# Patient Record
Sex: Male | Born: 1970
Health system: Southern US, Community
[De-identification: ages and names within clinical notes are randomized; demographics above are authoritative.]

## PROBLEM LIST (undated history)

## (undated) DIAGNOSIS — R7881 Bacteremia: Secondary | ICD-10-CM

## (undated) DIAGNOSIS — I96 Gangrene, not elsewhere classified: Secondary | ICD-10-CM

## (undated) DIAGNOSIS — E119 Type 2 diabetes mellitus without complications: Secondary | ICD-10-CM

## (undated) DIAGNOSIS — B955 Unspecified streptococcus as the cause of diseases classified elsewhere: Secondary | ICD-10-CM

## (undated) DIAGNOSIS — I739 Peripheral vascular disease, unspecified: Secondary | ICD-10-CM

## (undated) HISTORY — DX: Peripheral vascular disease, unspecified: I73.9

## (undated) HISTORY — DX: Bacteremia: R78.81

## (undated) HISTORY — DX: Unspecified streptococcus as the cause of diseases classified elsewhere: B95.5

---

## 2008-01-10 ENCOUNTER — Emergency Department (HOSPITAL_COMMUNITY): Admission: EM | Admit: 2008-01-10 | Discharge: 2008-01-10 | Payer: Self-pay | Admitting: Emergency Medicine

## 2008-01-10 ENCOUNTER — Encounter (INDEPENDENT_AMBULATORY_CARE_PROVIDER_SITE_OTHER): Payer: Self-pay | Admitting: Cardiovascular Disease

## 2008-01-11 ENCOUNTER — Ambulatory Visit (HOSPITAL_COMMUNITY): Admission: RE | Admit: 2008-01-11 | Discharge: 2008-01-11 | Payer: Self-pay | Admitting: Cardiovascular Disease

## 2008-10-31 ENCOUNTER — Emergency Department (HOSPITAL_COMMUNITY): Admission: EM | Admit: 2008-10-31 | Discharge: 2008-10-31 | Payer: Self-pay | Admitting: Emergency Medicine

## 2011-02-02 LAB — DIFFERENTIAL
Basophils Absolute: 0 10*3/uL (ref 0.0–0.1)
Eosinophils Relative: 2 % (ref 0–5)
Lymphocytes Relative: 24 % (ref 12–46)
Lymphs Abs: 1.5 10*3/uL (ref 0.7–4.0)
Neutro Abs: 4.4 10*3/uL (ref 1.7–7.7)
Neutrophils Relative %: 69 % (ref 43–77)

## 2011-02-02 LAB — BASIC METABOLIC PANEL
BUN: 10 mg/dL (ref 6–23)
Calcium: 9.8 mg/dL (ref 8.4–10.5)
GFR calc non Af Amer: >60 mL/min (ref >60)
Glucose, Bld: 147 mg/dL — ABNORMAL HIGH (ref 70–99)
Potassium: 4.5 mEq/L (ref 3.5–5.1)

## 2011-02-02 LAB — POCT CARDIAC MARKERS
CKMB, poc: 1.5 ng/mL (ref 1.0–8.0)
Myoglobin, poc: 60.1 ng/mL (ref 12–200)
Troponin i, poc: <0.05 ng/mL (ref 0.00–0.09)
Troponin i, poc: <0.05 ng/mL (ref 0.00–0.09)

## 2011-02-02 LAB — CBC
HCT: 43.6 % (ref 39.0–52.0)
Platelets: 205 10*3/uL (ref 150–400)
RDW: 13.2 % (ref 11.5–15.5)
WBC: 6.4 10*3/uL (ref 4.0–10.5)

## 2011-07-13 LAB — CBC
HCT: 43.2
Hemoglobin: 15.1
MCHC: 34.9
MCV: 83.2
Platelets: 195
RDW: 13.5

## 2011-07-13 LAB — POCT I-STAT, CHEM 8
Chloride: 102
Glucose, Bld: 163 — ABNORMAL HIGH
HCT: 45
Sodium: 136

## 2011-07-13 LAB — DIFFERENTIAL
Basophils Absolute: 0
Eosinophils Absolute: 0.1
Eosinophils Relative: 2
Lymphocytes Relative: 27
Monocytes Absolute: 0.3

## 2011-07-13 LAB — POCT CARDIAC MARKERS
CKMB, poc: 1.7
CKMB, poc: 1.9
Myoglobin, poc: 52.7
Troponin i, poc: <0.05

## 2011-07-13 LAB — CK TOTAL AND CKMB (NOT AT ARMC): Relative Index: 2.8 — ABNORMAL HIGH

## 2011-07-13 LAB — TROPONIN I: Troponin I: 0.01

## 2011-07-13 LAB — HEMOGLOBIN A1C
Hgb A1c MFr Bld: 6.8 — ABNORMAL HIGH
Mean Plasma Glucose: 165

## 2011-07-13 LAB — MAGNESIUM: Magnesium: 1.8

## 2011-07-13 LAB — PROTIME-INR: INR: 0.9

## 2015-08-21 ENCOUNTER — Ambulatory Visit: Payer: Self-pay | Admitting: Family Medicine

## 2015-08-23 ENCOUNTER — Ambulatory Visit: Payer: Self-pay | Admitting: Family Medicine

## 2020-05-28 ENCOUNTER — Ambulatory Visit: Payer: Self-pay

## 2020-05-30 ENCOUNTER — Ambulatory Visit
Admission: RE | Admit: 2020-05-30 | Discharge: 2020-05-30 | Disposition: A | Discharge status: Home / Self Care | Payer: Self-pay | Source: Ambulatory Visit

## 2020-05-30 ENCOUNTER — Emergency Department (HOSPITAL_COMMUNITY): Payer: Self-pay

## 2020-05-30 ENCOUNTER — Inpatient Hospital Stay (HOSPITAL_COMMUNITY): Payer: Self-pay

## 2020-05-30 ENCOUNTER — Inpatient Hospital Stay (HOSPITAL_COMMUNITY)
Admission: EM | Admit: 2020-05-30 | Discharge: 2020-06-07 | DRG: 853 | Disposition: A | Discharge status: Home Health | Payer: Self-pay | Attending: Family Medicine | Admitting: Family Medicine

## 2020-05-30 ENCOUNTER — Encounter (HOSPITAL_COMMUNITY): Payer: Self-pay | Admitting: *Deleted

## 2020-05-30 ENCOUNTER — Other Ambulatory Visit: Payer: Self-pay

## 2020-05-30 DIAGNOSIS — Z20822 Contact with and (suspected) exposure to covid-19: Secondary | ICD-10-CM | POA: Diagnosis present

## 2020-05-30 DIAGNOSIS — N179 Acute kidney failure, unspecified: Secondary | ICD-10-CM | POA: Diagnosis present

## 2020-05-30 DIAGNOSIS — IMO0002 Reserved for concepts with insufficient information to code with codable children: Secondary | ICD-10-CM | POA: Insufficient documentation

## 2020-05-30 DIAGNOSIS — I82890 Acute embolism and thrombosis of other specified veins: Secondary | ICD-10-CM | POA: Diagnosis not present

## 2020-05-30 DIAGNOSIS — Z89429 Acquired absence of other toe(s), unspecified side: Secondary | ICD-10-CM

## 2020-05-30 DIAGNOSIS — A480 Gas gangrene: Secondary | ICD-10-CM | POA: Diagnosis present

## 2020-05-30 DIAGNOSIS — E871 Hypo-osmolality and hyponatremia: Secondary | ICD-10-CM | POA: Insufficient documentation

## 2020-05-30 DIAGNOSIS — S98112A Complete traumatic amputation of left great toe, initial encounter: Secondary | ICD-10-CM

## 2020-05-30 DIAGNOSIS — L089 Local infection of the skin and subcutaneous tissue, unspecified: Secondary | ICD-10-CM

## 2020-05-30 DIAGNOSIS — I96 Gangrene, not elsewhere classified: Secondary | ICD-10-CM

## 2020-05-30 DIAGNOSIS — D649 Anemia, unspecified: Secondary | ICD-10-CM | POA: Diagnosis present

## 2020-05-30 DIAGNOSIS — I152 Hypertension secondary to endocrine disorders: Secondary | ICD-10-CM | POA: Diagnosis present

## 2020-05-30 DIAGNOSIS — E1169 Type 2 diabetes mellitus with other specified complication: Secondary | ICD-10-CM | POA: Diagnosis present

## 2020-05-30 DIAGNOSIS — E785 Hyperlipidemia, unspecified: Secondary | ICD-10-CM | POA: Diagnosis present

## 2020-05-30 DIAGNOSIS — A419 Sepsis, unspecified organism: Secondary | ICD-10-CM | POA: Diagnosis present

## 2020-05-30 DIAGNOSIS — R7881 Bacteremia: Secondary | ICD-10-CM

## 2020-05-30 DIAGNOSIS — E1159 Type 2 diabetes mellitus with other circulatory complications: Secondary | ICD-10-CM

## 2020-05-30 DIAGNOSIS — Z9889 Other specified postprocedural states: Secondary | ICD-10-CM

## 2020-05-30 DIAGNOSIS — J302 Other seasonal allergic rhinitis: Secondary | ICD-10-CM | POA: Diagnosis present

## 2020-05-30 DIAGNOSIS — E876 Hypokalemia: Secondary | ICD-10-CM | POA: Insufficient documentation

## 2020-05-30 DIAGNOSIS — F40240 Claustrophobia: Secondary | ICD-10-CM | POA: Diagnosis present

## 2020-05-30 DIAGNOSIS — E1152 Type 2 diabetes mellitus with diabetic peripheral angiopathy with gangrene: Secondary | ICD-10-CM | POA: Diagnosis present

## 2020-05-30 DIAGNOSIS — A401 Sepsis due to streptococcus, group B: Principal | ICD-10-CM | POA: Diagnosis present

## 2020-05-30 DIAGNOSIS — E1151 Type 2 diabetes mellitus with diabetic peripheral angiopathy without gangrene: Secondary | ICD-10-CM | POA: Diagnosis present

## 2020-05-30 DIAGNOSIS — E1165 Type 2 diabetes mellitus with hyperglycemia: Secondary | ICD-10-CM

## 2020-05-30 DIAGNOSIS — R509 Fever, unspecified: Secondary | ICD-10-CM | POA: Clinically undetermined

## 2020-05-30 DIAGNOSIS — Z91013 Allergy to seafood: Secondary | ICD-10-CM

## 2020-05-30 DIAGNOSIS — R239 Unspecified skin changes: Secondary | ICD-10-CM

## 2020-05-30 DIAGNOSIS — R6520 Severe sepsis without septic shock: Secondary | ICD-10-CM | POA: Diagnosis present

## 2020-05-30 DIAGNOSIS — I82812 Embolism and thrombosis of superficial veins of left lower extremities: Secondary | ICD-10-CM | POA: Diagnosis present

## 2020-05-30 DIAGNOSIS — B955 Unspecified streptococcus as the cause of diseases classified elsewhere: Secondary | ICD-10-CM

## 2020-05-30 DIAGNOSIS — M726 Necrotizing fasciitis: Secondary | ICD-10-CM

## 2020-05-30 DIAGNOSIS — L03116 Cellulitis of left lower limb: Secondary | ICD-10-CM | POA: Diagnosis present

## 2020-05-30 HISTORY — DX: Gangrene, not elsewhere classified: I96

## 2020-05-30 HISTORY — DX: Type 2 diabetes mellitus without complications: E11.9

## 2020-05-30 LAB — COMPREHENSIVE METABOLIC PANEL
ALT: 42 U/L (ref 0–44)
AST: 46 U/L — ABNORMAL HIGH (ref 15–41)
Albumin: 2.8 g/dL — ABNORMAL LOW (ref 3.5–5.0)
Alkaline Phosphatase: 120 U/L (ref 38–126)
Anion gap: 14 (ref 5–15)
BUN: 42 mg/dL — ABNORMAL HIGH (ref 6–20)
CO2: 27 mmol/L (ref 22–32)
Calcium: 8.9 mg/dL (ref 8.9–10.3)
Chloride: 90 mmol/L — ABNORMAL LOW (ref 98–111)
Creatinine, Ser: 1.35 mg/dL — ABNORMAL HIGH (ref 0.61–1.24)
GFR calc Af Amer: >60 mL/min (ref >60)
GFR calc non Af Amer: >60 mL/min (ref >60)
Glucose, Bld: 320 mg/dL — ABNORMAL HIGH (ref 70–99)
Potassium: 3.4 mmol/L — ABNORMAL LOW (ref 3.5–5.1)
Sodium: 131 mmol/L — ABNORMAL LOW (ref 135–145)
Total Bilirubin: 0.7 mg/dL (ref 0.3–1.2)
Total Protein: 6.6 g/dL (ref 6.5–8.1)

## 2020-05-30 LAB — PROTIME-INR
INR: 1.1 (ref 0.8–1.2)
Prothrombin Time: 14 seconds (ref 11.4–15.2)

## 2020-05-30 LAB — IRON AND TIBC
Iron: 24 ug/dL — ABNORMAL LOW (ref 45–182)
Saturation Ratios: 11 % — ABNORMAL LOW (ref 17.9–39.5)
TIBC: 214 ug/dL — ABNORMAL LOW (ref 250–450)
UIBC: 190 ug/dL

## 2020-05-30 LAB — LACTIC ACID, PLASMA
Lactic Acid, Venous: 1.4 mmol/L (ref 0.5–1.9)
Lactic Acid, Venous: 2.8 mmol/L (ref 0.5–1.9)
Lactic Acid, Venous: 1.1 mmol/L (ref 0.5–1.9)
Lactic Acid, Venous: 1.5 mmol/L (ref 0.5–1.9)

## 2020-05-30 LAB — CBC WITH DIFFERENTIAL/PLATELET
Abs Immature Granulocytes: 0.11 10*3/uL — ABNORMAL HIGH (ref 0.00–0.07)
Basophils Absolute: 0 10*3/uL (ref 0.0–0.1)
Basophils Relative: 0 %
Eosinophils Absolute: 0 10*3/uL (ref 0.0–0.5)
Eosinophils Relative: 0 %
HCT: 34.5 % — ABNORMAL LOW (ref 39.0–52.0)
Hemoglobin: 11.5 g/dL — ABNORMAL LOW (ref 13.0–17.0)
Immature Granulocytes: 1 %
Lymphocytes Relative: 9 %
Lymphs Abs: 1.2 10*3/uL (ref 0.7–4.0)
MCH: 28.3 pg (ref 26.0–34.0)
MCHC: 33.3 g/dL (ref 30.0–36.0)
MCV: 85 fL (ref 80.0–100.0)
Monocytes Absolute: 1 10*3/uL (ref 0.1–1.0)
Monocytes Relative: 8 %
Neutro Abs: 11.1 10*3/uL — ABNORMAL HIGH (ref 1.7–7.7)
Neutrophils Relative %: 82 %
Platelets: 214 10*3/uL (ref 150–400)
RBC: 4.06 MIL/uL — ABNORMAL LOW (ref 4.22–5.81)
RDW: 12.8 % (ref 11.5–15.5)
WBC: 13.4 10*3/uL — ABNORMAL HIGH (ref 4.0–10.5)
nRBC: 0 % (ref 0.0–0.2)

## 2020-05-30 LAB — URINALYSIS, ROUTINE W REFLEX MICROSCOPIC
Bilirubin Urine: NEGATIVE
Glucose, UA: >=500 mg/dL — AB
Ketones, ur: 80 mg/dL — AB
Leukocytes,Ua: NEGATIVE
Nitrite: NEGATIVE
Protein, ur: 100 mg/dL — AB
Specific Gravity, Urine: 1.023 (ref 1.005–1.030)
pH: 5 (ref 5.0–8.0)

## 2020-05-30 LAB — CBG MONITORING, ED
Glucose-Capillary: 296 mg/dL — ABNORMAL HIGH (ref 70–99)
Glucose-Capillary: 264 mg/dL — ABNORMAL HIGH (ref 70–99)

## 2020-05-30 LAB — VITAMIN B12: Vitamin B-12: 1056 pg/mL — ABNORMAL HIGH (ref 180–914)

## 2020-05-30 LAB — FERRITIN: Ferritin: 1083 ng/mL — ABNORMAL HIGH (ref 24–336)

## 2020-05-30 LAB — RETICULOCYTES
Immature Retic Fract: 9.3 % (ref 2.3–15.9)
RBC.: 3.91 MIL/uL — ABNORMAL LOW (ref 4.22–5.81)
Retic Count, Absolute: 44.2 10*3/uL (ref 19.0–186.0)
Retic Ct Pct: 1.1 % (ref 0.4–3.1)

## 2020-05-30 LAB — FOLATE: Folate: 8.5 ng/mL (ref >5.9)

## 2020-05-30 LAB — HIV ANTIBODY (ROUTINE TESTING W REFLEX): HIV Screen 4th Generation wRfx: NONREACTIVE

## 2020-05-30 LAB — SARS CORONAVIRUS 2 BY RT PCR (HOSPITAL ORDER, PERFORMED IN ~~LOC~~ HOSPITAL LAB): SARS Coronavirus 2: NEGATIVE

## 2020-05-30 LAB — APTT: aPTT: 32 seconds (ref 24–36)

## 2020-05-30 MED ORDER — LACTATED RINGERS IV BOLUS (SEPSIS)
1000.0000 mL | Freq: Once | INTRAVENOUS | Status: AC
  Administered 2020-05-30: 1000 mL via INTRAVENOUS

## 2020-05-30 MED ORDER — SODIUM CHLORIDE 0.9 % IV SOLN
2.0000 g | Freq: Once | INTRAVENOUS | Status: AC
  Administered 2020-05-30: 2 g via INTRAVENOUS
  Filled 2020-05-30: qty 2

## 2020-05-30 MED ORDER — HYDRALAZINE HCL 20 MG/ML IJ SOLN
5.0000 mg | Freq: Three times a day (TID) | INTRAMUSCULAR | Status: DC
  Administered 2020-05-30: 5 mg via INTRAVENOUS
  Filled 2020-05-30: qty 1

## 2020-05-30 MED ORDER — HEPARIN SODIUM (PORCINE) 5000 UNIT/ML IJ SOLN
5000.0000 [IU] | Freq: Three times a day (TID) | INTRAMUSCULAR | Status: DC
  Administered 2020-05-30 – 2020-06-06 (×21): 5000 [IU] via SUBCUTANEOUS
  Filled 2020-05-30 (×21): qty 1

## 2020-05-30 MED ORDER — METRONIDAZOLE IN NACL 5-0.79 MG/ML-% IV SOLN
500.0000 mg | Freq: Once | INTRAVENOUS | Status: AC
  Administered 2020-05-30: 500 mg via INTRAVENOUS
  Filled 2020-05-30: qty 100

## 2020-05-30 MED ORDER — VANCOMYCIN HCL IN DEXTROSE 1-5 GM/200ML-% IV SOLN
1000.0000 mg | Freq: Two times a day (BID) | INTRAVENOUS | Status: DC
  Administered 2020-05-30: 1000 mg via INTRAVENOUS
  Filled 2020-05-30: qty 200

## 2020-05-30 MED ORDER — ACETAMINOPHEN 325 MG PO TABS
650.0000 mg | ORAL_TABLET | Freq: Four times a day (QID) | ORAL | Status: DC | PRN
  Administered 2020-05-30 – 2020-06-06 (×9): 650 mg via ORAL
  Filled 2020-05-30 (×9): qty 2

## 2020-05-30 MED ORDER — SODIUM CHLORIDE 0.9 % IV SOLN
2.0000 g | Freq: Three times a day (TID) | INTRAVENOUS | Status: DC
  Administered 2020-05-30 – 2020-05-31 (×2): 2 g via INTRAVENOUS
  Filled 2020-05-30 (×4): qty 2

## 2020-05-30 MED ORDER — ACETAMINOPHEN 325 MG PO TABS
650.0000 mg | ORAL_TABLET | Freq: Once | ORAL | Status: AC
  Administered 2020-05-30: 650 mg via ORAL
  Filled 2020-05-30: qty 2

## 2020-05-30 MED ORDER — INSULIN ASPART 100 UNIT/ML ~~LOC~~ SOLN
0.0000 [IU] | Freq: Three times a day (TID) | SUBCUTANEOUS | Status: DC
  Administered 2020-05-30 – 2020-05-31 (×2): 5 [IU] via SUBCUTANEOUS

## 2020-05-30 MED ORDER — GADOBUTROL 1 MMOL/ML IV SOLN
8.5000 mL | Freq: Once | INTRAVENOUS | Status: AC | PRN
  Administered 2020-05-30: 8.5 mL via INTRAVENOUS

## 2020-05-30 MED ORDER — LORAZEPAM 1 MG PO TABS
1.0000 mg | ORAL_TABLET | Freq: Once | ORAL | Status: AC
  Administered 2020-05-30: 1 mg via ORAL
  Filled 2020-05-30: qty 1

## 2020-05-30 MED ORDER — VANCOMYCIN HCL IN DEXTROSE 1-5 GM/200ML-% IV SOLN
1000.0000 mg | Freq: Once | INTRAVENOUS | Status: AC
  Administered 2020-05-30: 1000 mg via INTRAVENOUS
  Filled 2020-05-30: qty 200

## 2020-05-30 MED ORDER — LACTATED RINGERS IV SOLN: INTRAVENOUS | Status: AC

## 2020-05-30 NOTE — ED Triage Notes (Signed)
Pt reports recent flu like symptoms. Is diabetic, has infection to left big toe. Redness and swelling on to foot, red streaks to left lower leg. Reports intermittent fever.

## 2020-05-30 NOTE — ED Notes (Signed)
Patient drank fluids, no nausea or vomiting noted. Tolerated well

## 2020-05-30 NOTE — Progress Notes (Signed)
Inpatient Diabetes Program Recommendations  AACE/ADA: New Consensus Statement on Inpatient Glycemic Control (2015)  Target Ranges:  Prepandial:   less than 140 mg/dL      Peak postprandial:   less than 180 mg/dL (1-2 hours)      Critically ill patients:  140 - 180 mg/dL   Lab Results  Component Value Date   GLUCAP 296 (H) 05/30/2020   HGBA1C (H) 01/10/2008    6.8 (NOTE)   The ADA recommends the following therapeutic goals for glycemic   control related to Hgb A1C measurement:   Goal of Therapy:   < 7.0% Hgb A1C   Action Suggested:  > 8.0% Hgb A1C   Ref:  Diabetes Care, 22, Suppl. 1, 1999    Review of Glycemic Control  Diabetes history: Diabetes Outpatient Diabetes medications: none noted Current orders for Inpatient glycemic control: none  A1c on file 6.8% on 12/2007  Inpatient Diabetes Program Recommendations:    -  A1c level -  Novolog 0-15 units Q4 hours -  May need basal insulin  No insurance, close follow up needed.   Thanks,  Christena Deem RN, MSN, BC-ADM Inpatient Diabetes Coordinator Team Pager [phone] (8a-5p)

## 2020-05-30 NOTE — H&P (Signed)
Family Medicine Teaching [facility] Admission History and Physical Service Pager: [phone]  Patient name: Sean Carter Medical record number: [id number] Date of birth: 1971-07-06 Age: 49 y.o. Gender: male  Primary Care Provider: No primary care provider on file.  Consultants: Creta Levin Code Status: Full Code Preferred Emergency Contact: Leda Min [phone] (significant other)  Chief Complaint: Having flulike symptoms, pain in left great toe  Assessment and Plan: Sean Carter is a 49 y.o. male presenting with 2 weeks of progressively worsening left great toe pain, now with color changes, patient also experiencing flulike symptoms. PMH is significant for type 2 diabetes and seasonal allergies.  Sepsis secondary to gas gangrene of left foot, first digit: Patient reports stubbing his left great toe about 2 weeks ago, reports it slowly became more red, swollen, started changing colors before he presented to urgent care, then to the emergency department.  In the emergency department left great toe is erythematous and edematous in appearance with 1.5 x 1.5 cm eschar on medial aspect of left great toe.  Erythematous area of cellulitis extends to the dorsum of the left foot and the medial aspect of patient's toe proximally along medial aspect of the calf almost approaching the knee, hot to the touch (see photos below). T-max noted to be 102.8 F, WBC 13.4 with left shift.  There is also concern for flulike symptoms, chest x-ray on admission was negative for any focal infiltrate.  However, left foot x-ray showing "soft tissue gas at medial aspect of left great toe consistent with infection of gas-forming organism.  No evidence for osteomyelitis".  Lactic acid on admission normal at 1.4, one hour later increased to 2.8 and patient's blood pressure elevated to 205/115.  Was given Tylenol for fever, started on cefepime, Flagyl, and vancomycin, administered 1 L bolus LR.  ED provider contacted  orthopedics, recommended getting left foot/toe MRI, ABIs, and consulting vascular surgery for arterial work-up. -Admit to progressive, attending Dr. Leveda Anna -Ortho consulted in the ED, appreciate recommendations -Vascular consulted, appreciate recommendations -Continue IV cefepime, vancomycin, Flagyl -Lower extremity vascular ultrasound with ABI -MRI left foot with/without contrast - patient given 1 mg Ativan for MRI as he is claustrophobic -Urinalysis, urine culture, blood culture -Contact precautions on for MRSA (cannot rule out MRSA as possible cause of patient's infection, would rather be safe than sorry) -IV heparin 5000 units subcu 3 times daily -A.m. CBC, BMP, lipid panel, HIV screen -Full code -Vitals per routine -Continuous cardiac, pulse ox monitoring -PT/OT eval scheduled to start 8/13  T2DM: is not on any medications. Last A1c 7.9% ~8 months ago. He used to take metformin but not in a while.  CBG on admission 300, most likely acutely elevated secondary to acute infection. -Follow-up HbA1c -Sensitive sliding scale insulin  HTN: Patient denies any history of elevated blood pressure.  Blood pressure on admission 161/96, increased to 205/115.  Most recently blood pressure 157/85.  Possibly patient has untreated high blood pressure, also possibly acutely elevated due to pain of left great toe. -Continue to monitor  Elevated creatinine: On admission creatinine slightly elevated at 1.35.  Looks that about 11 years prior creatinine was 0.75, no recent lab to judge baseline.  GFR on admission greater than 60. -Continue to monitor -We will avoid nephrotoxic medications  Anemia: Hemoglobin admission slightly decreased at 11.5, unclear etiology.  MCV normal at 85. -Continue to monitor -Anemia panel ordered - ferritin, folate, iron, TIBC, vitamin B12, reticulocytes  Hyponatremia: Sodium 131 on admission. -Continue to  monitor CMP  Hypokalemia: Potassium 3.4 on admission. -Continue to  monitor -Can replete as needed  FEN/GI: Currently on IV LR 150 cc/hour, n.p.o. for now Prophylaxis: Heparin subQ   Disposition: Progressive  History of Present Illness:  Sean Carter is a 49 y.o. male presenting with 2 weeks of progressively worsening left great toe pain, now with color changes, patient also experiencing flulike symptoms.  Hit toe on bedpost about 2 weeks ago, Friday started getting worse, changing color and he was getting sick, chills, fevers, achy, diarrhea, denies shortness of breath, chest pain. 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 Says he doesn't quite take care of himself as he should. He thought it was getting better but this AM started looking significantly worse. He went to [facility] he was told to go to ED. Had a fever this AM it was 99.1 at home, here was 102.8*F.    Review Of Systems: Per HPI with the following additions: see HPI   Patient Active Problem List   Diagnosis Date Noted  . Gangrene of toe of right foot [facility])     Past Medical History: Past Medical History:  Diagnosis Date  . Diabetes mellitus without complication [facility])     Past Surgical History: History reviewed. No pertinent surgical history.  Cut finger (2001) - 2nd digit of left hand was cut off skin and grafted skin from 4th finger   Social History: Social History   Tobacco Use  . Smoking status: Never Smoker  . Smokeless tobacco: Never Used  Substance Use Topics  . Alcohol use: Not on file  . Drug use: Not on file   Additional social history: none  Please also refer to relevant sections of EMR.  Family History: History reviewed. No pertinent family history. Mom - dead at 66, pulmonary emboliosm Dad - carotid artery stenosis,  Uncle - stents in his neck Grandfathers: Alzheimers, MI  Allergies and Medications: No Known Allergies No current facility-administered medications on file prior to encounter.   No current outpatient medications on file  prior to encounter.  Scallops - found at 49 years old, anaphylaxis  Objective: BP (!) 205/115   Pulse (!) 101   Temp (!) 102.8 F (39.3 C) (Oral)   Ht 5\' 8"  (1.727 m)   Wt 83 kg   SpO2 100%   BMI 27.83 kg/m  Exam: General: Nontoxic-appearing, pleasant patient Eyes: EOMI, PERRLA, anicteric ENTM: Clear nasal passages, no pharyngeal erythema, moist mucous membranes Neck: No LAD, no thyromegaly, trachea midline Cardiovascular: RRR, grade 1 systolic murmur appreciated, S2 clear Respiratory: CTA bilaterally, comfortable work of breathing Gastrointestinal: Soft, nontender to palpation, normal bowel sounds appreciated MSK/Derm: Left great toe with necrotic wound/eschar appreciated medially, edematous great toe, diffusely erythematous, warm to touch, trace left ankle nonpitting edema, unable to appreciate dorsalis pedis and tibialis posterior pulses on the left; 2+ TP and DP pulses appreciated on the right lower extremity. Neuro: Cranial nerves II-XII grossly intact, no focal neurological deficits appreciated on exam Psych: Alert and oriented x3, very pleasant patient  Labs and Imaging: CBC BMET  Recent Labs  Lab 05/30/20 0955  WBC 13.4*  HGB 11.5*  HCT 34.5*  PLT 214   Recent Labs  Lab 05/30/20 0955  NA 131*  K 3.4*  CL 90*  CO2 27  BUN 42*  CREATININE 1.35*  GLUCOSE 320*  CALCIUM 8.9      DG Chest Port 1 View  Result Date: 05/30/2020 CLINICAL DATA:  Necrosis medial first toe question infection, possible sepsis EXAM: PORTABLE CHEST 1 VIEW COMPARISON:  Portable exam 1115 hours compared to 01/10/2008 FINDINGS: Minimal enlargement of cardiac silhouette. Mediastinal contours and pulmonary vascularity normal. Low lung volumes without pulmonary infiltrate, pleural effusion or pneumothorax. Osseous structures unremarkable. IMPRESSION: Low lung volumes without acute infiltrate. Electronically Signed   By: Ulyses Southward M.D.   On: 05/30/2020 11:34   DG Toe Great Left  Result Date:  05/30/2020 CLINICAL DATA:  Infection left question sepsis EXAM: LEFT GREAT TOE COMPARISON:  None FINDINGS: Skin irregularity and gas at medial aspect of LEFT great toe consistent with infection by a gas-forming organism. Osseous mineralization normal for technique. Joint spaces preserved. No fracture, dislocation or bone destruction. Specifically, no osseous changes to suggest acute osteomyelitis identified. IMPRESSION: Soft tissue gas identified at medial aspect of LEFT great toe consistent with infection by a gas-forming organism. No radiographic evidence of osteomyelitis; if this remains a clinical concern, consider MR assessment. Findings called to Terance Hart PA on 05/30/2020 at 1140 hours. Electronically Signed   By: Ulyses Southward M.D.   On: 05/30/2020 11:40      Dollene Cleveland, DO 05/30/2020, 12:36 PM PGY-3, [facility] FPTS Intern pager: [phone], text pages welcome

## 2020-05-30 NOTE — Consult Note (Signed)
Hospital Consult    Reason for Consult:  Gangrene of left great toe Referring Physician:  Drs. Virgil Benedict MRN #:  [id number]  History of Present Illness: This is a 48 y.o. male here with left great toe infection.  He has diabetes.  He states that he is never known he is hypertensive although has been hypertensive since being here.  He stubbed his toe on the left over a week ago.  States that he has had fevers at home associated with nausea vomiting has been able to eat for 2 days.  Now presents for evaluation of left great toe ulcer.  Denies any previous history of vascular surgery.  Is a lifelong non-smoker.  Past Medical History:  Diagnosis Date  . Diabetes mellitus without complication ([name])     History reviewed. No pertinent surgical history.  No Known Allergies  Prior to Admission medications   Not on File    Social History   Socioeconomic History  . Marital status: Married    Spouse name: Not on file  . Number of children: Not on file  . Years of education: Not on file  . Highest education level: Not on file  Occupational History  . Not on file  Tobacco Use  . Smoking status: Never Smoker  . Smokeless tobacco: Never Used  Substance and Sexual Activity  . Alcohol use: Not on file  . Drug use: Not on file  . Sexual activity: Not on file  Other Topics Concern  . Not on file  Social History Narrative  . Not on file   Social Determinants of Health   Financial Resource Strain:   . Difficulty of Paying Living Expenses:   Food Insecurity:   . Worried About Programme researcher, broadcasting/film/video in the Last Year:   . Barista in the Last Year:   Transportation Needs:   . Freight forwarder (Medical):   Marland Kitchen Lack of Transportation (Non-Medical):   Physical Activity:   . Days of Exercise per Week:   . Minutes of Exercise per Session:   Stress:   . Feeling of Stress :   Social Connections:   . Frequency of Communication with Friends and Family:   . Frequency of Social  Gatherings with Friends and Family:   . Attends Religious Services:   . Active Member of Clubs or Organizations:   . Attends Banker Meetings:   Marland Kitchen Marital Status:   Intimate Partner Violence:   . Fear of Current or Ex-Partner:   . Emotionally Abused:   Marland Kitchen Physically Abused:   . Sexually Abused:     History reviewed. No pertinent family history.  ROS:  Cardiovascular: []  chest pain/pressure []  palpitations []  SOB lying flat []  DOE []  pain in legs while walking []  pain in legs at rest []  pain in legs at night []  non-healing ulcers []  hx of DVT []  swelling in legs  Pulmonary: []  productive cough []  asthma/wheezing []  home O2  Neurologic: []  weakness in []  arms []  legs []  numbness in []  arms []  legs []  hx of CVA []  mini stroke [] difficulty speaking or slurred speech []  temporary loss of vision in one eye []  dizziness  Hematologic: []  hx of cancer []  bleeding problems []  problems with blood clotting easily  Endocrine:   [x]  diabetes []  thyroid disease  GI []  vomiting blood []  blood in stool  GU: []  CKD/renal failure []  HD--[]  M/W/F or []  T/T/S []  burning with urination []  blood in  urine  Psychiatric: []  anxiety []  depression  Musculoskeletal: []  arthritis []  joint pain  Integumentary: [x]  rashes []  ulcers  Constitutional: [x]  fever [x]  chills   Physical Examination  Vitals:   05/30/20 1738 05/30/20 1823  BP: (!) 159/93 (!) 153/86  Pulse: (!) 103 (!) 104  Resp: (!) 21 15  Temp:  99.8 F (37.7 C)  SpO2: 97% 95%   Body mass index is 27.83 kg/m.  General:  Nad, generalized chills HENT: WNL, normocephalic Pulmonary: normal non-labored breathing Cardiac: 2+ bilateral popliteal pulses Thready bilateral dorsalis pedis pulses Abdomen: soft, ntnd Extremities: Gangrenous changes to the left great toe with associated swelling and erythema Musculoskeletal: no muscle wasting or atrophy  Neurologic: A&O X 3  CBC    Component  Value Date/Time   WBC 13.4 (H) 05/30/2020 0955   RBC 3.91 (L) 05/30/2020 1720   RBC 4.06 (L) 05/30/2020 0955   HGB 11.5 (L) 05/30/2020 0955   HCT 34.5 (L) 05/30/2020 0955   PLT 214 05/30/2020 0955   MCV 85.0 05/30/2020 0955   MCH 28.3 05/30/2020 0955   MCHC 33.3 05/30/2020 0955   RDW 12.8 05/30/2020 0955   LYMPHSABS 1.2 05/30/2020 0955   MONOABS 1.0 05/30/2020 0955   EOSABS 0.0 05/30/2020 0955   BASOSABS 0.0 05/30/2020 0955    BMET    Component Value Date/Time   NA 131 (L) 05/30/2020 0955   K 3.4 (L) 05/30/2020 0955   CL 90 (L) 05/30/2020 0955   CO2 27 05/30/2020 0955   GLUCOSE 320 (H) 05/30/2020 0955   BUN 42 (H) 05/30/2020 0955   CREATININE 1.35 (H) 05/30/2020 0955   CALCIUM 8.9 05/30/2020 0955   GFRNONAA >60 05/30/2020 0955   GFRAA >60 05/30/2020 0955    COAGS: Lab Results  Component Value Date   INR 1.1 05/30/2020   INR 0.9 01/10/2008     Non-Invasive Vascular Imaging:   ABI pending  MRI IMPRESSION: 1. Abnormal accentuated T2 signal and enhancement in the proximal and distal phalanges of the great toe are suspicious for early osteomyelitis. 2. Abnormal ulceration medially in the great toe with gas tracking in the soft tissues of the great toe at about the level of the interphalangeal joint especially medially. No drainable abscess. 3. Dorsal subcutaneous edema in the foot, cellulitis not excluded. 4. Mild flexor hallucis longus tendinopathy. 5. Despite efforts by the technologist and patient, motion artifact is present on today's exam and could not be eliminated. This reduces exam sensitivity and specificity.  ASSESSMENT/PLAN: This is a 49 y.o. male with left great toe infection.  He has what I believed to be weakly palpable bilateral dorsalis pedis pulses although he has significant chills on my exam today which makes this exam difficult.  Left foot is quite erythematous.  Risk factors for vascular disease include diabetes.  We will get ABIs.  If ABIs are  normal will not need any vascular invention otherwise we will plan for aortogram with bilateral lower extremity runoff possible intervention on the left early next week.  Should be okay for intervention with Dr. 07/30/2020 in the time being.  Cloyde Oregel C. 07/30/2020, MD [facility] Office: [phone] Pager: [phone]

## 2020-05-30 NOTE — Progress Notes (Signed)
Pharmacy Antibiotic Note  Sean Carter is a 49 y.o. male admitted on 05/30/2020 with cellulitis.  Pharmacy has been consulted for vancomycin and cefepime dosing. Pt is febrile with Tmax 102.8 and WBC is elevated at 13.4. SCr is also elevated at 1.35.   Plan: Vancomycin 1gm IV Q12H Cefepime 2g IV Q8H F/u renal fxn, C&S, clinical status and trough at SS  Height: 5\' 8"  (172.7 cm) Weight: 83 kg (183 lb) IBW/kg (Calculated) : 68.4  Temp (24hrs), Avg:100.3 F (37.9 C), Min:99.1 F (37.3 C), Max:102.8 F (39.3 C)  Recent Labs  Lab 05/30/20 0955  WBC 13.4*  CREATININE 1.35*  LATICACIDVEN 1.4    Estimated Creatinine Clearance: 69.5 mL/min (A) (by C-G formula based on SCr of 1.35 mg/dL (H)).    No Known Allergies  Antimicrobials this admission: Vanc 8/12>> Cefepime 8/12>> Flagyl x 1 8/12  Dose adjustments this admission: N/A  Microbiology results: Pending  Thank you for allowing pharmacy to be a part of this patient's care.  Cayton Cuevas, 10/12 05/30/2020 10:54 AM

## 2020-05-30 NOTE — ED Notes (Signed)
Patient transported to Ultrasound 

## 2020-05-30 NOTE — Progress Notes (Signed)
Attempt to receive report. 3* ED staff attempted to connect with reporting nurse with no success.

## 2020-05-30 NOTE — Consult Note (Addendum)
Reason for Consult:Left great toe infection Referring Physician: Anabel Carter is an 49 y.o. male.  HPI: Sean Carter comes in with a 1-2 week hx/o right great toe infection. He injured it on the foot of a bed and it's been not healing ever since. In the last few days he's been having fevers, chills, sweats, N/V, and increased redness and swelling. He went to UC today and they directed him to the hospital. He works as a Investment banker, operational.  Past Medical History:  Diagnosis Date  . Diabetes mellitus without complication ([name])     History reviewed. No pertinent surgical history.  History reviewed. No pertinent family history.  Social History:  reports that he has never smoked. He has never used smokeless tobacco. No history on file for alcohol use and drug use.  Allergies: No Known Allergies  Medications: I have reviewed the patient's current medications.  Results for orders placed or performed during the hospital encounter of 05/30/20 (from the past 48 hour(s))  Lactic acid, plasma     Status: None   Collection Time: 05/30/20  9:55 AM  Result Value Ref Range   Lactic Acid, Venous 1.4 0.5 - 1.9 mmol/L    Comment: Performed at [facility], [address] [place], [place] [address]  Comprehensive metabolic panel     Status: Abnormal   Collection Time: 05/30/20  9:55 AM  Result Value Ref Range   Sodium 131 (L) 135 - 145 mmol/L   Potassium 3.4 (L) 3.5 - 5.1 mmol/L   Chloride 90 (L) 98 - 111 mmol/L   CO2 27 22 - 32 mmol/L   Glucose, Bld 320 (H) 70 - 99 mg/dL    Comment: Glucose reference range applies only to samples taken after fasting for at least 8 hours.   BUN 42 (H) 6 - 20 mg/dL   Creatinine, Ser 6.60 (H) 0.61 - 1.24 mg/dL   Calcium 8.9 8.9 - 63.0 mg/dL   Total Protein 6.6 6.5 - 8.1 g/dL   Albumin 2.8 (L) 3.5 - 5.0 g/dL   AST 46 (H) 15 - 41 U/L   ALT 42 0 - 44 U/L   Alkaline Phosphatase 120 38 - 126 U/L   Total Bilirubin 0.7 0.3 - 1.2 mg/dL   GFR calc non Af Amer >60 >60  mL/min   GFR calc Af Amer >60 >60 mL/min   Anion gap 14 5 - 15    Comment: Performed at [facility], [address] [place], [place] [address]  CBC with Differential     Status: Abnormal   Collection Time: 05/30/20  9:55 AM  Result Value Ref Range   WBC 13.4 (H) 4.0 - 10.5 K/uL   RBC 4.06 (L) 4.22 - 5.81 MIL/uL   Hemoglobin 11.5 (L) 13.0 - 17.0 g/dL   HCT 93.2 (L) 39 - 52 %   MCV 85.0 80.0 - 100.0 fL   MCH 28.3 26.0 - 34.0 pg   MCHC 33.3 30.0 - 36.0 g/dL   RDW 35.5 73.2 - 20.2 %   Platelets 214 150 - 400 K/uL   nRBC 0.0 0.0 - 0.2 %   Neutrophils Relative % 82 %   Neutro Abs 11.1 (H) 1.7 - 7.7 K/uL   Lymphocytes Relative 9 %   Lymphs Abs 1.2 0.7 - 4.0 K/uL   Monocytes Relative 8 %   Monocytes Absolute 1.0 0 - 1 K/uL   Eosinophils Relative 0 %   Eosinophils Absolute 0.0 0 -  0 K/uL   Basophils Relative 0 %   Basophils Absolute 0.0 0 - 0 K/uL   Immature Granulocytes 1 %   Abs Immature Granulocytes 0.11 (H) 0.00 - 0.07 K/uL    Comment: Performed at [facility], [address] [place], [place] [address]  CBG monitoring, ED     Status: Abnormal   Collection Time: 05/30/20 11:11 AM  Result Value Ref Range   Glucose-Capillary 296 (H) 70 - 99 mg/dL    Comment: Glucose reference range applies only to samples taken after fasting for at least 8 hours.    DG Chest Port 1 View  Result Date: 05/30/2020 CLINICAL DATA:  Necrosis medial first toe question infection, possible sepsis EXAM: PORTABLE CHEST 1 VIEW COMPARISON:  Portable exam 1115 hours compared to 01/10/2008 FINDINGS: Minimal enlargement of cardiac silhouette. Mediastinal contours and pulmonary vascularity normal. Low lung volumes without pulmonary infiltrate, pleural effusion or pneumothorax. Osseous structures unremarkable. IMPRESSION: Low lung volumes without acute infiltrate. Electronically Signed   By: Ulyses Southward M.D.   On: 05/30/2020 11:34   DG Toe Great Left  Result Date: 05/30/2020 CLINICAL DATA:  Infection  left question sepsis EXAM: LEFT GREAT TOE COMPARISON:  None FINDINGS: Skin irregularity and gas at medial aspect of LEFT great toe consistent with infection by a gas-forming organism. Osseous mineralization normal for technique. Joint spaces preserved. No fracture, dislocation or bone destruction. Specifically, no osseous changes to suggest acute osteomyelitis identified. IMPRESSION: Soft tissue gas identified at medial aspect of LEFT great toe consistent with infection by a gas-forming organism. No radiographic evidence of osteomyelitis; if this remains a clinical concern, consider MR assessment. Findings called to Terance Hart PA on 05/30/2020 at 1140 hours. Electronically Signed   By: Ulyses Southward M.D.   On: 05/30/2020 11:40    Review of Systems  Constitutional: Positive for chills, diaphoresis and fever.  HENT: Negative for ear discharge, ear pain, hearing loss and tinnitus.   Eyes: Negative for photophobia and pain.  Respiratory: Negative for cough and shortness of breath.   Cardiovascular: Negative for chest pain.  Gastrointestinal: Positive for nausea and vomiting. Negative for abdominal pain.  Genitourinary: Negative for dysuria, flank pain, frequency and urgency.  Musculoskeletal: Negative for back pain, myalgias and neck pain.  Skin: Positive for wound (Right great toe).  Neurological: Negative for dizziness and headaches.  Hematological: Does not bruise/bleed easily.  Psychiatric/Behavioral: The patient is not nervous/anxious.    Blood pressure (!) 205/115, pulse (!) 101, temperature (!) 102.8 F (39.3 C), temperature source Oral, height 5\' 8"  (1.727 m), weight 83 kg, SpO2 100 %. Physical Exam Constitutional:      General: He is not in acute distress.    Appearance: He is well-developed. He is not diaphoretic.  HENT:     Head: Normocephalic and atraumatic.  Eyes:     General: No scleral icterus.       Right eye: No discharge.        Left eye: No discharge.      Conjunctiva/sclera: Conjunctivae normal.  Cardiovascular:     Rate and Rhythm: Normal rate and regular rhythm.  Pulmonary:     Effort: Pulmonary effort is normal. No respiratory distress.  Musculoskeletal:     Cervical back: Normal range of motion.     Comments: LLE No traumatic wounds, ecchymosis, or rash  Necrotic wound on medial great toe, fusiform edema, erythema dorsum of foot  No knee or ankle effusion  Knee stable to varus/ valgus  and anterior/posterior stress  Sens DPN, SPN intact, TN absent  Motor EHL, ext, flex, evers 5/5  DP 0, PT 0, No significant edema  Skin:    General: Skin is warm and dry.  Neurological:     Mental Status: He is alert.  Psychiatric:        Behavior: Behavior normal.     Assessment/Plan: Left great toe infection -- Will get MRI and ABI's. Dr. Lajoyce Corners to evaluate. NPO after MN. DM  Ischemic gangrenous ulceration right great toe.  Recommend consult vascular vein surgery for arterial work-up.    Sean Caldron, PA-C Orthopedic Surgery [phone] 05/30/2020, 11:50 AM

## 2020-05-30 NOTE — ED Notes (Signed)
Attempted to call report. RN will re-attempt.

## 2020-05-30 NOTE — Sepsis Progress Note (Signed)
Notified bedside nurse of need to draw repeat lactic acid. Initial LA 1.4. Second LA 2.8. Need repeat LA to show interventions are working.  Fluids, blood cx and abx were all given per protocol.

## 2020-05-30 NOTE — ED Triage Notes (Signed)
Pt present with infected left toe. Pt reports having flu like symptoms for past week. Left great toe is red swollen , with redness extending into foot.   Patient is being discharged from the Urgent Care and sent to the Emergency Department via private vehicle . Per B Wurst , patient is in need of higher level of care due to possible sepsis . Patient is aware and verbalizes understanding of plan of care.  Vitals:   05/30/20 0855  Pulse: 96  Resp: 20  Temp: 99.1 F (37.3 C)  SpO2: 97%

## 2020-05-30 NOTE — ED Provider Notes (Signed)
[facility] EMERGENCY DEPARTMENT Provider Note   CSN: [id number] Arrival date & time: 05/30/20  0931     History Chief Complaint  Patient presents with  . Foot Ulcer  . Fever    Sean Carter is a 49 y.o. male with history of DM who presents with fever and L toe/foot pain. He states he stubbed his toe on a bedpost last week. Since then he's had a wound that has been gradually worsening. The wound has been draining and is malodorous. He reports having the "flu" as well - he's had fever, chills, headache, and body aches. He was tested for COVID yesterday and it was negative. He went to UC today and was referred to ED for possible septic presentation. Pt states he's been off his diabetes meds for 3 years since he works all the time and has lost weight. He does not see medical providers regularly.    HPI     Past Medical History:  Diagnosis Date  . Diabetes mellitus without complication ([name])     There are no problems to display for this patient.   History reviewed. No pertinent surgical history.     History reviewed. No pertinent family history.  Social History   Tobacco Use  . Smoking status: Never Smoker  . Smokeless tobacco: Never Used  Substance Use Topics  . Alcohol use: Not on file  . Drug use: Not on file    Home Medications Prior to Admission medications   Not on File    Allergies    Patient has no known allergies.  Review of Systems   Review of Systems  Constitutional: Positive for chills and fever.  HENT: Positive for congestion.   Respiratory: Negative for shortness of breath.   Cardiovascular: Negative for chest pain.    Physical Exam Updated Vital Signs BP (!) 205/115   Pulse (!) 101   Temp (!) 102.8 F (39.3 C) (Oral)   Ht 5\' 8"  (1.727 m)   Wt 83 kg   SpO2 100%   BMI 27.83 kg/m   Physical Exam Vitals and nursing note reviewed.  Constitutional:      General: He is in acute distress (uncomfortable and shaking).      Appearance: He is well-developed.  HENT:     Head: Normocephalic and atraumatic.     Nose: Rhinorrhea present.  Eyes:     General: No scleral icterus.       Right eye: No discharge.        Left eye: No discharge.     Conjunctiva/sclera: Conjunctivae normal.     Pupils: Pupils are equal, round, and reactive to light.  Cardiovascular:     Rate and Rhythm: Tachycardia present.  Pulmonary:     Effort: Pulmonary effort is normal. No respiratory distress.     Breath sounds: Normal breath sounds.  Abdominal:     General: There is no distension.     Palpations: Abdomen is soft.     Tenderness: There is no abdominal tenderness.  Musculoskeletal:     Cervical back: Normal range of motion.     Comments: Area of necrosis over the medial aspect of the L toe with surrounding erythema streaking up the leg. 1+ DP pulse  Skin:    General: Skin is warm and dry.  Neurological:     Mental Status: He is alert and oriented to person, place, and time.  Psychiatric:        Behavior: Behavior normal.  ED Results / Procedures / Treatments   Labs (all labs ordered are listed, but only abnormal results are displayed) Labs Reviewed  LACTIC ACID, PLASMA - Abnormal; Notable for the following components:      Result Value   Lactic Acid, Venous 2.8 (*)    All other components within normal limits  COMPREHENSIVE METABOLIC PANEL - Abnormal; Notable for the following components:   Sodium 131 (*)    Potassium 3.4 (*)    Chloride 90 (*)    Glucose, Bld 320 (*)    BUN 42 (*)    Creatinine, Ser 1.35 (*)    Albumin 2.8 (*)    AST 46 (*)    All other components within normal limits  CBC WITH DIFFERENTIAL/PLATELET - Abnormal; Notable for the following components:   WBC 13.4 (*)    RBC 4.06 (*)    Hemoglobin 11.5 (*)    HCT 34.5 (*)    Neutro Abs 11.1 (*)    Abs Immature Granulocytes 0.11 (*)    All other components within normal limits  CBG MONITORING, ED - Abnormal; Notable for the  following components:   Glucose-Capillary 296 (*)    All other components within normal limits  SARS CORONAVIRUS 2 BY RT PCR (HOSPITAL ORDER, PERFORMED IN [facility])  CULTURE, BLOOD (ROUTINE X 2)  CULTURE, BLOOD (ROUTINE X 2)  URINE CULTURE  LACTIC ACID, PLASMA  PROTIME-INR  APTT  URINALYSIS, ROUTINE W REFLEX MICROSCOPIC    EKG None  Radiology DG Chest Port 1 View  Result Date: 05/30/2020 CLINICAL DATA:  Necrosis medial first toe question infection, possible sepsis EXAM: PORTABLE CHEST 1 VIEW COMPARISON:  Portable exam 1115 hours compared to 01/10/2008 FINDINGS: Minimal enlargement of cardiac silhouette. Mediastinal contours and pulmonary vascularity normal. Low lung volumes without pulmonary infiltrate, pleural effusion or pneumothorax. Osseous structures unremarkable. IMPRESSION: Low lung volumes without acute infiltrate. Electronically Signed   By: Ulyses Southward M.D.   On: 05/30/2020 11:34   DG Toe Great Left  Result Date: 05/30/2020 CLINICAL DATA:  Infection left question sepsis EXAM: LEFT GREAT TOE COMPARISON:  None FINDINGS: Skin irregularity and gas at medial aspect of LEFT great toe consistent with infection by a gas-forming organism. Osseous mineralization normal for technique. Joint spaces preserved. No fracture, dislocation or bone destruction. Specifically, no osseous changes to suggest acute osteomyelitis identified. IMPRESSION: Soft tissue gas identified at medial aspect of LEFT great toe consistent with infection by a gas-forming organism. No radiographic evidence of osteomyelitis; if this remains a clinical concern, consider MR assessment. Findings called to Terance Hart PA on 05/30/2020 at 1140 hours. Electronically Signed   By: Ulyses Southward M.D.   On: 05/30/2020 11:40    Procedures Procedures (including critical care time)  CRITICAL CARE Performed by: Bethel Born   Total critical care time: 35 minutes  Critical care time was exclusive of  separately billable procedures and treating other patients.  Critical care was necessary to treat or prevent imminent or life-threatening deterioration.  Critical care was time spent personally by me on the following activities: development of treatment plan with patient and/or surrogate as well as nursing, discussions with consultants, evaluation of patient's response to treatment, examination of patient, obtaining history from patient or surrogate, ordering and performing treatments and interventions, ordering and review of laboratory studies, ordering and review of radiographic studies, pulse oximetry and re-evaluation of patient's condition.   Medications Ordered in ED Medications  lactated ringers infusion (has no administration in  time range)  lactated ringers bolus 1,000 mL (has no administration in time range)  ceFEPIme (MAXIPIME) 2 g in sodium chloride 0.9 % 100 mL IVPB (has no administration in time range)  metroNIDAZOLE (FLAGYL) IVPB 500 mg (has no administration in time range)  vancomycin (VANCOCIN) IVPB 1000 mg/200 mL premix (has no administration in time range)  acetaminophen (TYLENOL) tablet 650 mg (has no administration in time range)    ED Course  I have reviewed the triage vital signs and the nursing notes.  Pertinent labs & imaging results that were available during my care of the patient were reviewed by me and considered in my medical decision making (see chart for details).  49 year old male with untreated DM who presents with a fever and L great toe infection. Vitals in triage are normal however on my exam he is febrile to 102.8, tachycardic in the 120s, tachypneic and significantly hypertensive (205/115). He is uncomfortable appearing and shivering. Toe looks acutely infected and there is concern for osteomyelitis. I do not see any obvious drainable abscess. Labs were obtained and show leukocytosis of 13, mild anemia (11). Lactate is normal. CMP shows hyponatremia (131),  hypokalemia (3.4), hyperglycemia (320), AKI (BUN 42, SCr 1.35). Code sepsis was initiated. Will order 1L bolus of LR as patient does not have septic shock. Will also obtain blood cultures and start abx, give Tylenol, and obtain xray of the L toe. Will consult orthopedics. Shared visit with Dr. Particia Nearing.  11:46 AM Discussed with radiologist - he states there is likely gas forming organism but no osteo  Ortho has seen pt - they state pt should be NPO after midnight and request vascular consult due to decreased pulses. ABIs have been ordered.  Family med to admit.  MDM Rules/Calculators/A&P                           Final Clinical Impression(s) / ED Diagnoses Final diagnoses:  Sepsis with acute organ dysfunction without septic shock, due to unspecified organism, unspecified type ([name])  Necrotizing fasciitis ([name])  Toe infection    Rx / DC Orders ED Discharge Orders    None       Bethel Born, PA-C 05/30/20 1621    Jacalyn Lefevre, MD 06/06/20 [phone]

## 2020-05-31 ENCOUNTER — Encounter (HOSPITAL_COMMUNITY): Payer: PRIVATE HEALTH INSURANCE

## 2020-05-31 ENCOUNTER — Inpatient Hospital Stay (HOSPITAL_COMMUNITY): Payer: Self-pay | Admitting: Certified Registered Nurse Anesthetist

## 2020-05-31 ENCOUNTER — Other Ambulatory Visit: Payer: Self-pay

## 2020-05-31 ENCOUNTER — Encounter (HOSPITAL_COMMUNITY)
Admission: EM | Disposition: A | Discharge status: Home Health | Payer: Self-pay | Source: Home / Self Care | Attending: Family Medicine

## 2020-05-31 ENCOUNTER — Encounter (HOSPITAL_COMMUNITY): Payer: Self-pay | Admitting: Family Medicine

## 2020-05-31 DIAGNOSIS — E871 Hypo-osmolality and hyponatremia: Secondary | ICD-10-CM

## 2020-05-31 DIAGNOSIS — E876 Hypokalemia: Secondary | ICD-10-CM

## 2020-05-31 DIAGNOSIS — E1165 Type 2 diabetes mellitus with hyperglycemia: Secondary | ICD-10-CM

## 2020-05-31 HISTORY — PX: AMPUTATION: SHX166

## 2020-05-31 LAB — CBC
HCT: 29.9 % — ABNORMAL LOW (ref 39.0–52.0)
Hemoglobin: 10.2 g/dL — ABNORMAL LOW (ref 13.0–17.0)
MCH: 28.7 pg (ref 26.0–34.0)
MCHC: 34.1 g/dL (ref 30.0–36.0)
MCV: 84 fL (ref 80.0–100.0)
Platelets: 157 10*3/uL (ref 150–400)
RBC: 3.56 MIL/uL — ABNORMAL LOW (ref 4.22–5.81)
RDW: 12.8 % (ref 11.5–15.5)
WBC: 14.8 10*3/uL — ABNORMAL HIGH (ref 4.0–10.5)
nRBC: 0 % (ref 0.0–0.2)

## 2020-05-31 LAB — URINE CULTURE: Culture: NO GROWTH

## 2020-05-31 LAB — BLOOD CULTURE ID PANEL (REFLEXED) - BCID2

## 2020-05-31 LAB — GLUCOSE, CAPILLARY
Glucose-Capillary: 252 mg/dL — ABNORMAL HIGH (ref 70–99)
Glucose-Capillary: 223 mg/dL — ABNORMAL HIGH (ref 70–99)
Glucose-Capillary: 163 mg/dL — ABNORMAL HIGH (ref 70–99)
Glucose-Capillary: 185 mg/dL — ABNORMAL HIGH (ref 70–99)
Glucose-Capillary: 234 mg/dL — ABNORMAL HIGH (ref 70–99)
Glucose-Capillary: 237 mg/dL — ABNORMAL HIGH (ref 70–99)

## 2020-05-31 LAB — COMPREHENSIVE METABOLIC PANEL
ALT: 40 U/L (ref 0–44)
AST: 49 U/L — ABNORMAL HIGH (ref 15–41)
Albumin: 2.4 g/dL — ABNORMAL LOW (ref 3.5–5.0)
Alkaline Phosphatase: 129 U/L — ABNORMAL HIGH (ref 38–126)
Anion gap: 16 — ABNORMAL HIGH (ref 5–15)
BUN: 33 mg/dL — ABNORMAL HIGH (ref 6–20)
CO2: 23 mmol/L (ref 22–32)
Calcium: 8.1 mg/dL — ABNORMAL LOW (ref 8.9–10.3)
Chloride: 91 mmol/L — ABNORMAL LOW (ref 98–111)
Creatinine, Ser: 1.11 mg/dL (ref 0.61–1.24)
GFR calc Af Amer: >60 mL/min (ref >60)
GFR calc non Af Amer: >60 mL/min (ref >60)
Glucose, Bld: 233 mg/dL — ABNORMAL HIGH (ref 70–99)
Potassium: 3 mmol/L — ABNORMAL LOW (ref 3.5–5.1)
Sodium: 130 mmol/L — ABNORMAL LOW (ref 135–145)
Total Bilirubin: 1.7 mg/dL — ABNORMAL HIGH (ref 0.3–1.2)
Total Protein: 5.4 g/dL — ABNORMAL LOW (ref 6.5–8.1)

## 2020-05-31 LAB — MRSA PCR SCREENING: MRSA by PCR: NEGATIVE

## 2020-05-31 LAB — LIPID PANEL
Cholesterol: 125 mg/dL (ref 0–200)
HDL: <10 mg/dL — ABNORMAL LOW (ref >40)
Triglycerides: 247 mg/dL — ABNORMAL HIGH (ref <150)
VLDL: 49 mg/dL — ABNORMAL HIGH (ref 0–40)

## 2020-05-31 LAB — HEMOGLOBIN A1C
Hgb A1c MFr Bld: 11.3 % — ABNORMAL HIGH (ref 4.8–5.6)
Mean Plasma Glucose: 277.61 mg/dL

## 2020-05-31 SURGERY — AMPUTATION DIGIT
Anesthesia: General | Laterality: Left

## 2020-05-31 MED ORDER — PHENYLEPHRINE HCL (PRESSORS) 10 MG/ML IV SOLN
INTRAVENOUS | Status: DC | PRN
  Administered 2020-05-31 (×2): 80 ug via INTRAVENOUS

## 2020-05-31 MED ORDER — LACTATED RINGERS IV SOLN: INTRAVENOUS | Status: DC

## 2020-05-31 MED ORDER — FENTANYL CITRATE (PF) 250 MCG/5ML IJ SOLN
INTRAMUSCULAR | Status: DC | PRN
  Administered 2020-05-31: 50 ug via INTRAVENOUS

## 2020-05-31 MED ORDER — AMISULPRIDE (ANTIEMETIC) 5 MG/2ML IV SOLN: 10.0000 mg | Freq: Once | INTRAVENOUS | Status: DC | PRN

## 2020-05-31 MED ORDER — FENTANYL CITRATE (PF) 100 MCG/2ML IJ SOLN: 25.0000 ug | INTRAMUSCULAR | Status: DC | PRN

## 2020-05-31 MED ORDER — OXYCODONE HCL 5 MG PO TABS: 5.0000 mg | ORAL_TABLET | Freq: Once | ORAL | Status: DC | PRN

## 2020-05-31 MED ORDER — MIDAZOLAM HCL 2 MG/2ML IJ SOLN
INTRAMUSCULAR | Status: AC
  Filled 2020-05-31: qty 2

## 2020-05-31 MED ORDER — PROPOFOL 10 MG/ML IV BOLUS
INTRAVENOUS | Status: DC | PRN
  Administered 2020-05-31: 160 mg via INTRAVENOUS

## 2020-05-31 MED ORDER — OXYCODONE HCL 5 MG/5ML PO SOLN: 5.0000 mg | Freq: Once | ORAL | Status: DC | PRN

## 2020-05-31 MED ORDER — SODIUM CHLORIDE 0.9 % IV SOLN
2.0000 g | INTRAVENOUS | Status: DC
  Filled 2020-05-31: qty 2000

## 2020-05-31 MED ORDER — POTASSIUM CHLORIDE 10 MEQ/100ML IV SOLN
10.0000 meq | INTRAVENOUS | Status: AC
  Administered 2020-05-31 (×3): 10 meq via INTRAVENOUS
  Filled 2020-05-31 (×4): qty 100

## 2020-05-31 MED ORDER — ONDANSETRON HCL 4 MG/2ML IJ SOLN: 4.0000 mg | Freq: Once | INTRAMUSCULAR | Status: DC | PRN

## 2020-05-31 MED ORDER — ORAL CARE MOUTH RINSE: 15.0000 mL | Freq: Once | OROMUCOSAL | Status: AC

## 2020-05-31 MED ORDER — ATORVASTATIN CALCIUM 10 MG PO TABS
20.0000 mg | ORAL_TABLET | Freq: Every day | ORAL | Status: DC
  Administered 2020-05-31 – 2020-06-03 (×4): 20 mg via ORAL
  Filled 2020-05-31 (×6): qty 2

## 2020-05-31 MED ORDER — PROPOFOL 10 MG/ML IV BOLUS
INTRAVENOUS | Status: AC
  Filled 2020-05-31: qty 20

## 2020-05-31 MED ORDER — CHLORHEXIDINE GLUCONATE 0.12 % MT SOLN: 15.0000 mL | Freq: Once | OROMUCOSAL | Status: AC

## 2020-05-31 MED ORDER — PENICILLIN G POTASSIUM 20000000 UNITS IJ SOLR
12.0000 10*6.[IU] | Freq: Two times a day (BID) | INTRAVENOUS | Status: DC
  Administered 2020-05-31 – 2020-06-03 (×6): 12 10*6.[IU] via INTRAVENOUS
  Filled 2020-05-31 (×9): qty 12

## 2020-05-31 MED ORDER — INSULIN ASPART 100 UNIT/ML ~~LOC~~ SOLN
0.0000 [IU] | Freq: Three times a day (TID) | SUBCUTANEOUS | Status: DC
  Administered 2020-05-31: 5 [IU] via SUBCUTANEOUS
  Administered 2020-05-31: 3 [IU] via SUBCUTANEOUS
  Administered 2020-06-01: 8 [IU] via SUBCUTANEOUS
  Administered 2020-06-01: 5 [IU] via SUBCUTANEOUS
  Administered 2020-06-01: 11 [IU] via SUBCUTANEOUS
  Administered 2020-06-02 (×3): 8 [IU] via SUBCUTANEOUS
  Administered 2020-06-03: 5 [IU] via SUBCUTANEOUS
  Administered 2020-06-03 – 2020-06-04 (×3): 11 [IU] via SUBCUTANEOUS
  Administered 2020-06-04 – 2020-06-05 (×4): 8 [IU] via SUBCUTANEOUS
  Administered 2020-06-05: 15 [IU] via SUBCUTANEOUS
  Administered 2020-06-06: 11 [IU] via SUBCUTANEOUS
  Administered 2020-06-06 (×2): 8 [IU] via SUBCUTANEOUS
  Administered 2020-06-07: 5 [IU] via SUBCUTANEOUS
  Administered 2020-06-07: 8 [IU] via SUBCUTANEOUS
  Administered 2020-06-07: 5 [IU] via SUBCUTANEOUS
  Filled 2020-05-31: qty 0.15

## 2020-05-31 MED ORDER — MIDAZOLAM HCL 2 MG/2ML IJ SOLN
INTRAMUSCULAR | Status: DC | PRN
  Administered 2020-05-31: 2 mg via INTRAVENOUS

## 2020-05-31 MED ORDER — CHLORHEXIDINE GLUCONATE 0.12 % MT SOLN
OROMUCOSAL | Status: AC
  Administered 2020-05-31: 15 mL via OROMUCOSAL
  Filled 2020-05-31: qty 15

## 2020-05-31 MED ORDER — 0.9 % SODIUM CHLORIDE (POUR BTL) OPTIME
TOPICAL | Status: DC | PRN
  Administered 2020-05-31: 1000 mL

## 2020-05-31 MED ORDER — FENTANYL CITRATE (PF) 250 MCG/5ML IJ SOLN
INTRAMUSCULAR | Status: AC
  Filled 2020-05-31: qty 5

## 2020-05-31 MED ORDER — ONDANSETRON HCL 4 MG/2ML IJ SOLN
INTRAMUSCULAR | Status: DC | PRN
  Administered 2020-05-31: 4 mg via INTRAVENOUS

## 2020-05-31 MED ORDER — POTASSIUM CHLORIDE 10 MEQ/100ML IV SOLN
10.0000 meq | INTRAVENOUS | Status: AC
  Administered 2020-05-31 (×4): 10 meq via INTRAVENOUS
  Filled 2020-05-31 (×5): qty 100

## 2020-05-31 MED ORDER — OXYCODONE HCL 5 MG PO TABS
5.0000 mg | ORAL_TABLET | Freq: Four times a day (QID) | ORAL | Status: DC | PRN
  Administered 2020-06-01 – 2020-06-06 (×8): 5 mg via ORAL
  Filled 2020-05-31 (×8): qty 1

## 2020-05-31 MED ORDER — LIDOCAINE 2% (20 MG/ML) 5 ML SYRINGE
INTRAMUSCULAR | Status: DC | PRN
  Administered 2020-05-31: 100 mg via INTRAVENOUS

## 2020-05-31 SURGICAL SUPPLY — 34 items
BNDG ELASTIC 4X5.8 VLCR STR LF (GAUZE/BANDAGES/DRESSINGS) ×1 IMPLANT
BNDG GAUZE ELAST 4 BULKY (GAUZE/BANDAGES/DRESSINGS) ×3 IMPLANT
CANISTER SUCT 3000ML PPV (MISCELLANEOUS) ×3 IMPLANT
CANISTER WOUND CARE 500ML ATS (WOUND CARE) ×2 IMPLANT
COVER SURGICAL LIGHT HANDLE (MISCELLANEOUS) ×3 IMPLANT
COVER WAND RF STERILE (DRAPES) ×1 IMPLANT
DRAPE EXTREMITY T 121X128X90 (DISPOSABLE) ×3 IMPLANT
DRAPE HALF SHEET 40X57 (DRAPES) ×1 IMPLANT
DRSG EMULSION OIL 3X3 NADH (GAUZE/BANDAGES/DRESSINGS) ×1 IMPLANT
DRSG VAC ATS SM SENSATRAC (GAUZE/BANDAGES/DRESSINGS) ×4 IMPLANT
ELECT REM PT RETURN 9FT ADLT (ELECTROSURGICAL) ×3
ELECTRODE REM PT RTRN 9FT ADLT (ELECTROSURGICAL) ×1 IMPLANT
GAUZE SPONGE 4X4 12PLY STRL (GAUZE/BANDAGES/DRESSINGS) ×3 IMPLANT
GLOVE BIO SURGEON STRL SZ7.5 (GLOVE) ×3 IMPLANT
GLOVE BIOGEL PI IND STRL 7.0 (GLOVE) IMPLANT
GLOVE BIOGEL PI IND STRL 8 (GLOVE) ×1 IMPLANT
GLOVE BIOGEL PI INDICATOR 7.0 (GLOVE) ×2
GLOVE BIOGEL PI INDICATOR 8 (GLOVE) ×2
GLOVE ECLIPSE 6.5 STRL STRAW (GLOVE) ×2 IMPLANT
GOWN STRL REUS W/ TWL LRG LVL3 (GOWN DISPOSABLE) ×2 IMPLANT
GOWN STRL REUS W/ TWL XL LVL3 (GOWN DISPOSABLE) ×2 IMPLANT
GOWN STRL REUS W/TWL LRG LVL3 (GOWN DISPOSABLE) ×3
GOWN STRL REUS W/TWL XL LVL3 (GOWN DISPOSABLE) ×3
KIT BASIN OR (CUSTOM PROCEDURE TRAY) ×3 IMPLANT
KIT TURNOVER KIT B (KITS) ×3 IMPLANT
NS IRRIG 1000ML POUR BTL (IV SOLUTION) ×3 IMPLANT
PACK GENERAL/GYN (CUSTOM PROCEDURE TRAY) ×3 IMPLANT
PAD ARMBOARD 7.5X6 YLW CONV (MISCELLANEOUS) ×6 IMPLANT
SUT ETHILON 3 0 PS 1 (SUTURE) ×1 IMPLANT
SUT VIC AB 3-0 SH 27 (SUTURE)
SUT VIC AB 3-0 SH 27X BRD (SUTURE) IMPLANT
TOWEL GREEN STERILE (TOWEL DISPOSABLE) ×6 IMPLANT
UNDERPAD 30X36 HEAVY ABSORB (UNDERPADS AND DIAPERS) ×3 IMPLANT
WATER STERILE IRR 1000ML POUR (IV SOLUTION) ×3 IMPLANT

## 2020-05-31 NOTE — Op Note (Signed)
Date: May 31, 2020  Preoperative diagnosis: Gangrene of left great toe  Postoperative diagnosis: Same  Procedure: Ray amputation of left great toe with negative pressure wound VAC placement  Surgeon: Dr. Cephus Shelling, MD  Assistant: OR staff  Indications: Patient is a 49 year old male who presented to the ED last night with sepsis secondary to a gangrenous left great toe and underlying diabetes that has been poorly controlled.  Ultimately after resuscitation he was found to have a palpable pedal pulse and presents today for left great toe amputation for source control after risks and benefits discussed.  Findings: Tennis racquet incision was made at the base of the left great toe and ultimately transected head of the metatarsal that appeared very healthy with very firm bone.  The wound was left open and negative pressure wound VAC was placed.  There was bleeding and the remaining tissue did appear viable.    Anesthesia: LMA  Details: Patient was taken to the operating room after informed consent was obtained.  Placed on operative table in the supine position.  Left foot was prepped draped usual sterile fashion.  Timeout was performed identify patient, procedure and site.  Initially made a tennis racquet incision at the base of the left great toe with a 15 blade scalpel.  Ultimately dissected down and the great toe was transected off the head of the metatarsal.  I then used a bone cutter and the head of the metatarsal was transected back in the wound and then cleaned up with a rongeur.  I also removed the sesmoid.  The soft tissue in the wound bed all appeared very healthy.  The wound was then copiously irrigated with saline.  There was bleeding in the wound and did appear healthy.  A negative pressure wound VAC was then brought on the field and a small sponge was then trimmed placed in the wound and then secured with dressing and a track pad that was attached to -125 mm Hg continuous  suction.  He will be taken to PACU in stable condition.  Complication: None  Condition: Stable  Cephus Shelling, MD [facility] Office: [phone]   Cephus Shelling

## 2020-05-31 NOTE — Progress Notes (Addendum)
Family Medicine Teaching Service Daily Progress Note Intern Pager: [phone]  Patient name: BECKER CHRISTOPHER Medical record number: [id number] Date of birth: 06/23/1971 Age: 49 y.o. Gender: male  Primary Care Provider: Patient, No Pcp Per Consultants: Ortho, Vascular Surgery  Code Status: FULL  Pt Overview and Major Events to Date:  Admitted 8/12  Assessment and Plan: SEVRIN SALLY is a 48 y.o. male presenting with 2 weeks of progressively worsening left great toe pain, now with color changes, patient also experiencing flulike symptoms. PMH is significant for type 2 diabetes and seasonal allergies.  Sepsis secondary to gas gangrene of left foot, first digit:  Started on cefepime, Flagyl, and vancomycin in ED yesterday. Ortho and Vascular consulted. Plan to get ABI's. MRSA negative. 1 of 2 blood cultures positive for Group B strep. Currently on Vanc, Cepfepine, Flagyl. Can consider narrowing abx to PCN G IV q4h or Ancef 2g IV q8h, per pharmacy recommendations. To OR today. -Ortho consulted, appreciate recommendations.  -Vascular consulted, appreciate recommendations - D/c IV cefepime, vancomycin, Flagyl (8/12-8/13) - Start Penicillin G IV q4h -Lower extremity vascular ultrasound with ABI -MRI left foot: suspicious for early osteomyelitis. Gas tracking in the soft tissues of the great toe at about the level of the interphalangeal joint especially medially.  - f/u ABI's - Daily CBC, BMP, lipid panel -Vitals per routine -Continuous cardiac, pulse ox monitoring -PT/OT eval and treat  T2DM: chronic, uncontrolled Not on home medications. Repeat A1c 11.3. CBG's 233-264 overnight. - daily CBG's - change from sensitive to moderate sliding scale insulin  - Consider starting Lantus tomorrow  - NPO currently  -We will plan to discharge home with diabetes regimen and close follow-up outpatient  Hyperlipidemia Lipid panel 8/12: TG 247, HDL <10, VLDL 49, LDL not calculated, total  cholesterol 125. ASCVD risk 24.2%. - Start Atorvastatin 20mg     HTN:  Not on any home medications. This AM BP: 163/90, likely elevated in the setting of active infection though normotensive overnight to 130's systolic, 70's diastolic. -Continue to monitor - No medications at this time  Elevated creatinine On admission creatinine slightly elevated at 1.35, now slightly improved to 1.11. Unknown baseline. -Continue to monitor -Avoid nephrotoxic medications - Daily BMP  Normocytic Anemia:  Hemoglobin admission slightly decreased at 11.5, unclear etiology. This AM 10.2, MCV 84 -Continue to monitor -Ferritin elevated to 1,083, B12 elevated to 1,056  Hyponatremia:  Sodium 131 on admission. Now 130 this AM. -Daily BMP  Hypokalemia:  Potassium 3.4 on admission. Now 3. Patient is NPO, will replete with IV K+ . -Continue to monitor -Can replete as needed - daily BMP  FEN/GI: Currently NPO Prophylaxis: Heparin subQ   Disposition: Inpatient   Subjective:  Patient denies pain this morning.  States he is hungry and thirsty, has not eaten in 2 days.  He thought he was doing well with his diabetes as he has lost weight, though he does not have a primary care doctor.   Objective: Temp:  [99.1 F (37.3 C)-103 F (39.4 C)] 99.8 F (37.7 C) (08/13 0427) Pulse Rate:  [88-134] 88 (08/13 0427) Resp:  [0-30] 20 (08/13 0427) BP: (121-205)/(68-115) 163/90 (08/13 0427) SpO2:  [89 %-100 %] 99 % (08/13 0427) Weight:  [81.9 kg-83 kg] 81.9 kg (08/12 2015) Physical Exam: General: Awake, alert, pleasant, in no distress Cardiovascular: RRR Respiratory: CTAB Abdomen: soft, non-tender in all quadrants, hypoactive BS Extremities: trace edema b/l, 2+DP right foot, 1+ DP left foot Left great toe with eschar  medially. Erythema extending laterally to 4th digit and to Medial foot. Picture below.         Laboratory: Recent Labs  Lab 05/30/20 0955 05/31/20 0108  WBC 13.4* 14.8*    HGB 11.5* 10.2*  HCT 34.5* 29.9*  PLT 214 157   Recent Labs  Lab 05/30/20 0955 05/31/20 0108  NA 131* 130*  K 3.4* 3.0*  CL 90* 91*  CO2 27 23  BUN 42* 33*  CREATININE 1.35* 1.11  CALCIUM 8.9 8.1*  PROT 6.6 5.4*  BILITOT 0.7 1.7*  ALKPHOS 120 129*  ALT 42 40  AST 46* 49*  GLUCOSE 320* 233*    Imaging/Diagnostic Tests: MR Foot Left without and with Contrast 8/12 IMPRESSION: 1. Abnormal accentuated T2 signal and enhancement in the proximal and distal phalanges of the great toe are suspicious for early osteomyelitis. 2. Abnormal ulceration medially in the great toe with gas tracking in the soft tissues of the great toe at about the level of the interphalangeal joint especially medially. No drainable abscess. 3. Dorsal subcutaneous edema in the foot, cellulitis not excluded. 4. Mild flexor hallucis longus tendinopathy. 5. Despite efforts by the technologist and patient, motion artifact is present on today's exam and could not be eliminated. This reduces exam sensitivity and specificity.  DG Chest Port 1 View 8/12 IMPRESSION: Low lung volumes without acute infiltrate.  DG Left Great Toe 8/12 IMPRESSION: Soft tissue gas identified at medial aspect of LEFT great toe consistent with infection by a gas-forming organism.  No radiographic evidence of osteomyelitis; if this remains a clinical concern, consider MR assessment.  Sabino Dick, DO 05/31/2020, 6:51 AM PGY-1, [facility] FPTS Intern pager: [phone], text pages welcome

## 2020-05-31 NOTE — Progress Notes (Signed)
Pt transported, MD ok to transport with out tele. Report called to OR short stay.

## 2020-05-31 NOTE — Hospital Course (Addendum)
Sean Carter is a 49 y.o. male presenting with 2 weeks of progressively worsening left great toe pain, now with color changes, patient also experiencing flulike symptoms. PMH is significant for type 2 diabetes and seasonal allergies.  Acute Superficial Vein Thrombosis involving the left great saphenous vein On POD#5 s/p left ray amputation, patient developed a focal area of purpuric/ecchymotic discoloration with surround erythema on the left medial lower extremity. Vascular was consulted and was found to have extensive thrombosis of the left great saphenous vein (mid-thigh to distal-calf). Patient placed on heparin drip and changed to Xarelto for discharge.   Unexplained Fever On 8/17, patient began to develop fever, patient had the COVID vaccine on the 8/16. Continued fevers overnight prompted broadening of the antibiotics to Vanc and Zosyn, blood and urine cultures negative. Patient found to have extensive thrombophlebitis of the left great saphenous vein. ID consulted and antibiotics broadened to Vanc and Rocephin. Patient remained afebrile for >36 hours before discharge.   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 Patient tachycardic, febrile, and hypertensive with elevated WBC and normal lactate. Patient given fluids and empirical antibiotics narrowed after cultures resulted GBS sensitive to PCN. Patient had gas gangrene with amputation on 8/13, placed on wound vac which was removed 8/18. Persistent fevers led to broadening of antibiotics with ID consultation to Vancomycin and Rocephin, transitioned to cephalexin for discharge. Patient currently using Darco shoe and walker.  Uncontrolled T2DM  Patient was not on any home medications. Hgb A1c 11.3. Patient placed on moderate sliding scale insulin, Lantus, and Novolog with meals and on carb modified diet; blood sugar still persistently elevated during hospital  stay.   Hyperlipidemia Lipid panel on admission significant for TG 247, HDL <10, VLDL 49, total cholesterol 784 and LDL not able to be calculated. ASCVD risk of 24.2%. Started on Atorvastatin 20mg  and increased to Atorvastatin 40mg  daily.   HTN Hypertensive to 205/115 in ED. Started on Losartan 12.5 mg on POD #1.   Electrolyte Abnormalities  Hyponatremia and hypokalemia throughout admission. Normalized after able to tolerate PO Potassium repleted.    Issues for f/u  Recommend BMP, as creatinine was elevated during admission initially.  Recommend glucose monitoring for medication adjustment. BP recheck, adjust medications as necessary  Consider referral to lipid clinic  Continued anticoagulation with Xarelto for 45 days Follow-up blood cultures and address antibiotics as necessary.

## 2020-05-31 NOTE — Progress Notes (Signed)
Interim Progress Note  RN called patient refevered back to 101 and is still within 6h window from last Tylenol dose. We will watch at this time as patient has not been on abx for 24 hours and there is a known source for infection. Prior to fever being recorded patient was satting 89% on RA and had been in the low 90s at last check.  Patient is now breathing on 2L  at 98%.

## 2020-05-31 NOTE — Progress Notes (Signed)
[facility]  Subjective  - questions about surgery   Objective (!) 163/86 92 99.1 F (37.3 C) (Oral) 17 100%  Intake/Output Summary (Last 24 hours) at 05/31/2020 1020 Last data filed at 05/31/2020 0821 Gross per 24 hour  Intake 3021.84 ml  Output 800 ml  Net 2221.84 ml    Left DP palpable Left great toe necrotic with cellulitis  Laboratory Lab Results: Recent Labs    05/30/20 0955 05/31/20 0108  WBC 13.4* 14.8*  HGB 11.5* 10.2*  HCT 34.5* 29.9*  PLT 214 157   BMET Recent Labs    05/30/20 0955 05/31/20 0108  NA 131* 130*  K 3.4* 3.0*  CL 90* 91*  CO2 27 23  GLUCOSE 320* 233*  BUN 42* 33*  CREATININE 1.35* 1.11  CALCIUM 8.9 8.1*    COAG Lab Results  Component Value Date   INR 1.1 05/30/2020   INR 0.9 01/10/2008   No results found for: PTT  Assessment/Planning:  49 year old male seen in the ED last night by Dr. Randie Heinz with gangrene of the left great toe.  He now has a palpable pedal pulse after resuscitation overnight.  Dr. Lajoyce Corners is out of town so vascular will proceed with his toe amputation today.  Discussed left great toe amputation likely leaving the wound open with packing given active infection.  Discussed still high risk for limb loss pending the extent of infection in the foot.  In discussing with patient has really not been controlling his diabetes or monitoring his glucose levels at home.  Cephus Shelling 05/31/2020 10:20 AM --

## 2020-05-31 NOTE — Transfer of Care (Signed)
Immediate Anesthesia Transfer of Care Note  Patient: Sean Carter  Procedure(s) Performed: Ray Amputation of Left Great Toe with Vac Placement (Left )  Patient Location: PACU  Anesthesia Type:General  Level of Consciousness: awake, alert  and oriented  Airway & Oxygen Therapy: Patient Spontanous Breathing  Post-op Assessment: Report given to RN and Post -op Vital signs reviewed and stable  Post vital signs: Reviewed and stable  Last Vitals:  Vitals Value Taken Time  BP 162/96 05/31/20 1138  Temp    Pulse 92 05/31/20 1140  Resp 15 05/31/20 1140  SpO2 91 % 05/31/20 1140  Vitals shown include unvalidated device data.  Last Pain:  Vitals:   05/31/20 0822  TempSrc: Oral  PainSc:          Complications: No complications documented.

## 2020-05-31 NOTE — Progress Notes (Signed)
OT Cancellation Note  Patient Details Name: Sean Carter MRN: [id number] DOB: 04-May-1971   Cancelled Treatment:    Reason Eval/Treat Not Completed: Patient at procedure or test/ unavailable. Pt undergoing amputation this AM. Will re-attempt OT eval as time allows and pt back on unit.   Lorre Munroe 05/31/2020, 10:49 AM

## 2020-05-31 NOTE — Plan of Care (Signed)

## 2020-05-31 NOTE — Progress Notes (Signed)
PT Cancellation Note  Patient Details Name: DUAINE RADIN MRN: [id number] DOB: [birth year]   Cancelled Treatment:    Reason Eval/Treat Not Completed: Patient at procedure or test/unavailable per chart review patient scheduled for surgical intervention today. Holding for now but will continue to follow acutely. If patient has urgent rehab needs, please direct message this therapist in Epic (8am-4pm) or page at number below.     Madelaine Etienne, DPT, PN1   Supplemental Physical Therapist [facility]    Pager [phone] Acute Rehab Office [phone]

## 2020-05-31 NOTE — Anesthesia Procedure Notes (Signed)
Procedure Name: LMA Insertion Date/Time: 05/31/2020 10:57 AM Performed by: Dairl Ponder, CRNA Pre-anesthesia Checklist: Patient identified, Emergency Drugs available, Suction available, Patient being monitored and Timeout performed Patient Re-evaluated:Patient Re-evaluated prior to induction Oxygen Delivery Method: Circle system utilized Preoxygenation: Pre-oxygenation with 100% oxygen Induction Type: IV induction LMA: LMA inserted LMA Size: 4.0 Number of attempts: 1 Placement Confirmation: positive ETCO2 and breath sounds checked- equal and bilateral Tube secured with: Tape Dental Injury: Teeth and Oropharynx as per pre-operative assessment

## 2020-05-31 NOTE — Anesthesia Preprocedure Evaluation (Signed)
Anesthesia Evaluation  Patient identified by MRN, date of birth, ID band Patient awake    Reviewed: Allergy & Precautions, NPO status , Patient's Chart, lab work & pertinent test results  History of Anesthesia Complications Negative for: history of anesthetic complications  Airway Mallampati: III  TM Distance: >3 FB Neck ROM: Full    Dental  (+) Teeth Intact   Pulmonary neg pulmonary ROS,    Pulmonary exam normal        Cardiovascular hypertension, Normal cardiovascular exam     Neuro/Psych negative neurological ROS  negative psych ROS   GI/Hepatic negative GI ROS, Neg liver ROS,   Endo/Other  diabetes, Poorly Controlled  Renal/GU ARFRenal disease  negative genitourinary   Musculoskeletal negative musculoskeletal ROS (+)   Abdominal   Peds  Hematology  (+) anemia ,   Anesthesia Other Findings  Osteomyelitis of toe  Reproductive/Obstetrics                             Anesthesia Physical Anesthesia Plan  ASA: III  Anesthesia Plan: General   Post-op Pain Management:    Induction: Intravenous  PONV Risk Score and Plan: 2 and Ondansetron, Dexamethasone, Midazolam and Treatment may vary due to age or medical condition  Airway Management Planned: LMA  Additional Equipment: None  Intra-op Plan:   Post-operative Plan: Extubation in OR  Informed Consent: I have reviewed the patients History and Physical, chart, labs and discussed the procedure including the risks, benefits and alternatives for the proposed anesthesia with the patient or authorized representative who has indicated his/her understanding and acceptance.     Dental advisory given  Plan Discussed with:   Anesthesia Plan Comments:         Anesthesia Quick Evaluation

## 2020-05-31 NOTE — Progress Notes (Signed)
PHARMACY - PHYSICIAN COMMUNICATION CRITICAL VALUE ALERT - BLOOD CULTURE IDENTIFICATION (BCID)  Sean Carter is an 49 y.o. male who presented to [facility] on 05/30/2020 with a chief complaint of sepsis and gas gangrene  Assessment:  1/2 blood cultures growing Group B Strep  Name of physician (or Provider) Contacted:  Dr. Melba Coon  Current antibiotics:   Vancomycin and Cefepime   Changes to prescribed antibiotics recommended:  Recommmend narrowing antibiotics to Penicillin g 4 MU IV q4h or Ancef 2 g IV q8h  Results for orders placed or performed during the hospital encounter of 05/30/20  Blood Culture ID Panel (Reflexed) (Collected: 05/30/2020 10:49 AM)  Result Value Ref Range   Enterococcus faecalis NOT DETECTED NOT DETECTED   Enterococcus Faecium NOT DETECTED NOT DETECTED   Listeria monocytogenes NOT DETECTED NOT DETECTED   Staphylococcus species NOT DETECTED NOT DETECTED   Staphylococcus aureus (BCID) NOT DETECTED NOT DETECTED   Staphylococcus epidermidis NOT DETECTED NOT DETECTED   Staphylococcus lugdunensis NOT DETECTED NOT DETECTED   Streptococcus species DETECTED (A) NOT DETECTED   Streptococcus agalactiae DETECTED (A) NOT DETECTED   Streptococcus pneumoniae NOT DETECTED NOT DETECTED   Streptococcus pyogenes NOT DETECTED NOT DETECTED   A.calcoaceticus-baumannii NOT DETECTED NOT DETECTED   Bacteroides fragilis NOT DETECTED NOT DETECTED   Enterobacterales NOT DETECTED NOT DETECTED   Enterobacter cloacae complex NOT DETECTED NOT DETECTED   Escherichia coli NOT DETECTED NOT DETECTED   Klebsiella aerogenes NOT DETECTED NOT DETECTED   Klebsiella oxytoca NOT DETECTED NOT DETECTED   Klebsiella pneumoniae NOT DETECTED NOT DETECTED   Proteus species NOT DETECTED NOT DETECTED   Salmonella species NOT DETECTED NOT DETECTED   Serratia marcescens NOT DETECTED NOT DETECTED   Haemophilus influenzae NOT DETECTED NOT DETECTED   Neisseria meningitidis NOT DETECTED NOT DETECTED    Pseudomonas aeruginosa NOT DETECTED NOT DETECTED   Stenotrophomonas maltophilia NOT DETECTED NOT DETECTED   Candida albicans NOT DETECTED NOT DETECTED   Candida auris NOT DETECTED NOT DETECTED   Candida glabrata NOT DETECTED NOT DETECTED   Candida krusei NOT DETECTED NOT DETECTED   Candida parapsilosis NOT DETECTED NOT DETECTED   Candida tropicalis NOT DETECTED NOT DETECTED   Cryptococcus neoformans/gattii NOT DETECTED NOT DETECTED    Eddie Candle 05/31/2020  7:36 AM

## 2020-05-31 NOTE — Anesthesia Postprocedure Evaluation (Signed)
Anesthesia Post Note  Patient: SHANON BECVAR  Procedure(s) Performed: Ray Amputation of Left Great Toe with Negative Pressure Vac Placement (Left )     Patient location during evaluation: PACU Anesthesia Type: General Level of consciousness: awake and alert Pain management: pain level controlled Vital Signs Assessment: post-procedure vital signs reviewed and stable Respiratory status: spontaneous breathing, nonlabored ventilation and respiratory function stable Cardiovascular status: blood pressure returned to baseline and stable Postop Assessment: no apparent nausea or vomiting Anesthetic complications: no   No complications documented.  Last Vitals:  Vitals:   05/31/20 1145 05/31/20 1154  BP: (!) 162/96 (!) 163/88  Pulse:  90  Resp:  14  Temp: 36.8 C   SpO2:  99%    Last Pain:  Vitals:   05/31/20 1154  TempSrc:   PainSc: 0-No pain                 Lucretia Kern

## 2020-06-01 ENCOUNTER — Encounter (HOSPITAL_COMMUNITY): Payer: Self-pay | Admitting: Vascular Surgery

## 2020-06-01 ENCOUNTER — Inpatient Hospital Stay (HOSPITAL_COMMUNITY): Payer: Self-pay

## 2020-06-01 DIAGNOSIS — L0390 Cellulitis, unspecified: Secondary | ICD-10-CM

## 2020-06-01 DIAGNOSIS — Z9889 Other specified postprocedural states: Secondary | ICD-10-CM

## 2020-06-01 LAB — COMPREHENSIVE METABOLIC PANEL
ALT: 45 U/L — ABNORMAL HIGH (ref 0–44)
AST: 53 U/L — ABNORMAL HIGH (ref 15–41)
Albumin: 2 g/dL — ABNORMAL LOW (ref 3.5–5.0)
Alkaline Phosphatase: 108 U/L (ref 38–126)
Anion gap: 10 (ref 5–15)
BUN: 23 mg/dL — ABNORMAL HIGH (ref 6–20)
CO2: 24 mmol/L (ref 22–32)
Calcium: 7.6 mg/dL — ABNORMAL LOW (ref 8.9–10.3)
Chloride: 93 mmol/L — ABNORMAL LOW (ref 98–111)
Creatinine, Ser: 0.86 mg/dL (ref 0.61–1.24)
GFR calc Af Amer: >60 mL/min (ref >60)
GFR calc non Af Amer: >60 mL/min (ref >60)
Glucose, Bld: 324 mg/dL — ABNORMAL HIGH (ref 70–99)
Potassium: 3.6 mmol/L (ref 3.5–5.1)
Sodium: 127 mmol/L — ABNORMAL LOW (ref 135–145)
Total Bilirubin: 0.7 mg/dL (ref 0.3–1.2)
Total Protein: 5 g/dL — ABNORMAL LOW (ref 6.5–8.1)

## 2020-06-01 LAB — CBC
HCT: 27.9 % — ABNORMAL LOW (ref 39.0–52.0)
Hemoglobin: 9.5 g/dL — ABNORMAL LOW (ref 13.0–17.0)
MCH: 28.4 pg (ref 26.0–34.0)
MCHC: 34.1 g/dL (ref 30.0–36.0)
MCV: 83.5 fL (ref 80.0–100.0)
Platelets: 159 10*3/uL (ref 150–400)
RBC: 3.34 MIL/uL — ABNORMAL LOW (ref 4.22–5.81)
RDW: 13.1 % (ref 11.5–15.5)
WBC: 13.9 10*3/uL — ABNORMAL HIGH (ref 4.0–10.5)
nRBC: 0 % (ref 0.0–0.2)

## 2020-06-01 LAB — GLUCOSE, CAPILLARY
Glucose-Capillary: 304 mg/dL — ABNORMAL HIGH (ref 70–99)
Glucose-Capillary: 272 mg/dL — ABNORMAL HIGH (ref 70–99)
Glucose-Capillary: 241 mg/dL — ABNORMAL HIGH (ref 70–99)
Glucose-Capillary: 270 mg/dL — ABNORMAL HIGH (ref 70–99)

## 2020-06-01 MED ORDER — INSULIN GLARGINE 100 UNIT/ML ~~LOC~~ SOLN
5.0000 [IU] | Freq: Every day | SUBCUTANEOUS | Status: DC
  Administered 2020-06-01: 5 [IU] via SUBCUTANEOUS
  Filled 2020-06-01 (×2): qty 0.05

## 2020-06-01 MED ORDER — LOSARTAN POTASSIUM 25 MG PO TABS
12.5000 mg | ORAL_TABLET | Freq: Every day | ORAL | Status: DC
  Administered 2020-06-01 – 2020-06-07 (×7): 12.5 mg via ORAL
  Filled 2020-06-01 (×7): qty 1

## 2020-06-01 NOTE — Discharge Summary (Addendum)
Family Medicine Teaching [facility] Discharge Summary  Patient name: Sean Carter Medical record number: [id number] Date of birth: 1971-03-06 Age: 49 y.o. Gender: male Date of Admission: 05/30/2020  Date of Discharge: 8/20 Admitting Physician: Moses Manners, MD  Primary Care Provider: Patient, No Pcp Per Consultants: Ortho, Vascular Surgery, Infectious Disease  Indication for Hospitalization: Left toe gangrene   Discharge Diagnoses/Problem List:  Principal Problem:   Fever Active Problems:   Diabetes mellitus type II, uncontrolled ([name])   Bacteremia due to group B Streptococcus   Amputation of left great toe ([name])   Superficial vein thrombosis   Hypertension associated with diabetes ([name])   Gangrene of toe of left foot ([name])   Diabetes mellitus type 2 with peripheral artery disease ([name])   Disposition: Home  Discharge Condition: Stable  Discharge Exam:  General: Alert, cooperative, no acute distress, sitting upright in bed. Cardio: RRR, systolic murmur appreciated, no r/g Pulm: CTAB, Normal respiratory effort Abdomen: Bowel sounds normal. Abdomen soft and non-tender.  Extremities: Dressing over left toe amputation c/d/i, no wound vac. Tender on palpation. 2+ pitting edema of LE with left calf tenderness to palpation. Purple and yellow discolorations noted around focal area of discoloration on the medial left calf  and minimal blood.  Brief Hospital Course:  Sean Carter is a 49 y.o. male presented with 2 weeks of progressively worsening left great toe pain, now with color changes, patient also experiencing flulike symptoms. PMH is significant for type 2 diabetes and seasonal allergies.  Recurrent Fevers On 8/17, patient began to develop fever which was originally thought to be due to COVID vaccine on the 8/16. Continued fevers overnight prompted broadening of the antibiotics to Vanc and Zosyn, blood and urine cultures were negative. Patient found to have extensive  thrombophlebitis of the left great saphenous vein. ID consulted and antibiotics broadened to Vanc and Rocephin. Patient remained afebrile for >36 hours before discharge.  Acute Superficial Vein Thrombosis involving the left great saphenous vein On POD#5 s/p left toe amputation, patient developed a focal area of purpuric/ecchymotic discoloration with surrounding erythema on the left medial lower extremity. Vascular was consulted and was found to have extensive thrombosis of the left great saphenous vein (mid-thigh to distal-calf). Patient was placed on heparin drip and changed to Xarelto at discharge.   Sepsis secondary to gas gangrene of left foot, Great Toe Patient presented with 2 week duration of worsening left great-toe pain. Patient was tachycardic, febrile, and hypertensive with elevated WBC and normal lactate. Patient was given fluids and empiric antibiotics that were narrowed after cultures resulted GBS sensitive to PCN. Patient had gas gangrene with amputation on 8/13, placed on wound vac which was removed 8/18. Persistent fevers led to broadening of antibiotics with ID consultation and recommendation to start Vancomycin and Rocephin, transitioned to cephalexin for discharge. Patient currently using Darco shoe and walker.  Uncontrolled T2DM  Patient was not on any home medications. Hgb A1c 11.3. Patient placed on moderate sliding scale insulin, Lantus, and Novolog with meals and on carb modified diet; blood sugar still persistently elevated during hospital stay ranging in the 300s.   Hyperlipidemia Lipid panel on admission significant for TG 247, HDL <10, VLDL 49, total cholesterol 782 and LDL not able to be calculated. ASCVD risk of 24.2%. Started on Atorvastatin 20mg  and increased to Atorvastatin 40mg  daily at time of discharge.   HTN Hypertensive to 205/115 in ED. Started on Losartan 12.5 mg on POD #1. This was continued at the  time of discharge.   Electrolyte Abnormalities   Hyponatremia and hypokalemia throughout admission. Normalized after able to tolerate PO Potassium repleted.    Issues for Follow Up:  1. Recommend BMP, as creatinine was elevated during admission initially.  2. Recommend BP check to adjust medications as necessary as readings were repeatedly elevated. 3. Consider referral to lipid clinic   4. Continued anticoagulation with Xarelto for 45 days after discharge 5. Follow-up blood cultures and address antibiotics as necessary. 6. Recommend checking blood sugars and adjusting diabetes medications and insulin dosing as necessary.  Significant Procedures: Left great toe amputation 8/13  Significant Labs and Imaging:  Recent Labs  Lab 06/05/20 1216 06/06/20 0128 06/07/20 0127  WBC 15.5* 17.5* 14.9*  HGB 8.5* 8.6* 8.4*  HCT 26.6* 27.1* 25.8*  PLT 481* 478* 526*   Recent Labs  Lab 06/02/20 0125 06/02/20 0125 06/03/20 0218 06/03/20 0218 06/04/20 0056 06/04/20 0056 06/06/20 0128 06/07/20 0127  NA 129*  --  128*  --  131*  --  130* 130*  K 3.6   < > 4.1   < > 3.9   < > 4.2 4.2  CL 93*  --  90*  --  93*  --  93* 95*  CO2 28  --  27  --  29  --  27 28  GLUCOSE 318*  --  319*  --  324*  --  264* 304*  BUN 23*  --  17  --  15  --  21* 20  CREATININE 0.94  --  0.82  --  0.83  --  0.82 0.89  CALCIUM 7.7*  --  7.6*  --  7.8*  --  8.0* 8.1*  ALKPHOS 124  --  126  --  159*  --  183* 176*  AST 49*  --  31  --  40  --  37 48*  ALT 47*  --  39  --  45*  --  46* 57*  ALBUMIN 1.9*  --  2.0*  --  1.8*  --  1.9* 2.0*   < > = values in this interval not displayed.    DG Chest 2 View  Result Date: 06/04/2020 CLINICAL DATA:  Fever. EXAM: CHEST - 2 VIEW COMPARISON:  05/30/2020. FINDINGS: Mediastinum and hilar structures normal. Mild cardiomegaly and pulmonary venous congestion. Low lung volumes. Very mild interstitial prominence cannot be completely excluded. No pleural effusion or pneumothorax. IMPRESSION: 1.  Mild cardiomegaly and pulmonary  venous congestion. 2. Low lung volumes. Very mild interstitial prominence cannot be excluded. Chest is stable from prior exam. Electronically Signed   By: Maisie Fus  Register   On: 06/04/2020 08:47   DG Tibia/Fibula Left  Result Date: 06/05/2020 CLINICAL DATA:  Possible infection after toe surgery EXAM: LEFT TIBIA AND FIBULA - 2 VIEW COMPARISON:  None. FINDINGS: Subcutaneous reticulation diffusely. No soft tissue gas, opaque foreign body, or erosion. Heel spurs and diffuse arterial calcification. IMPRESSION: 1. Generalized subcutaneous reticulation. No soft tissue gas or acute osseous finding. 2. Premature arterial calcification. Electronically Signed   By: Marnee Spring M.D.   On: 06/05/2020 10:53   MR FOOT LEFT W WO CONTRAST  Result Date: 05/30/2020 CLINICAL DATA:  Left foot swelling, diabetes.  Fever.  Toe pain. EXAM: MRI OF THE LEFT FOREFOOT WITHOUT AND WITH CONTRAST TECHNIQUE: Multiplanar, multisequence MR imaging of the left forefoot was performed both before and after administration of intravenous contrast. CONTRAST:  8.56mL GADAVIST GADOBUTROL 1 MMOL/ML IV SOLN COMPARISON:  Radiographs of 05/30/2020 FINDINGS: Despite efforts by the technologist and patient, motion artifact is present on today's exam and could not be eliminated. This reduces exam sensitivity and specificity. Bones/Joint/Cartilage Low-grade but abnormal accentuated T2 signal in the proximal and distal phalanges of the great toe are suspicious for early osteomyelitis. Spurring of the first metatarsal head. Ligaments Lisfranc ligament appears intact. Muscles and Tendons Mild flexor hallucis longus tendinopathy. Soft tissues Abnormal ulceration medially in the great toe with gas tracking in the soft tissues at about the level of the interphalangeal joint especially medially. Dorsal subcutaneous edema in the foot. IMPRESSION: 1. Abnormal accentuated T2 signal and enhancement in the proximal and distal phalanges of the great toe are  suspicious for early osteomyelitis. 2. Abnormal ulceration medially in the great toe with gas tracking in the soft tissues of the great toe at about the level of the interphalangeal joint especially medially. No drainable abscess. 3. Dorsal subcutaneous edema in the foot, cellulitis not excluded. 4. Mild flexor hallucis longus tendinopathy. 5. Despite efforts by the technologist and patient, motion artifact is present on today's exam and could not be eliminated. This reduces exam sensitivity and specificity. Electronically Signed   By: Gaylyn Rong M.D.   On: 05/30/2020 17:56   DG Chest Port 1 View  Result Date: 05/30/2020 CLINICAL DATA:  Necrosis medial first toe question infection, possible sepsis EXAM: PORTABLE CHEST 1 VIEW COMPARISON:  Portable exam 1115 hours compared to 01/10/2008 FINDINGS: Minimal enlargement of cardiac silhouette. Mediastinal contours and pulmonary vascularity normal. Low lung volumes without pulmonary infiltrate, pleural effusion or pneumothorax. Osseous structures unremarkable. IMPRESSION: Low lung volumes without acute infiltrate. Electronically Signed   By: Ulyses Southward M.D.   On: 05/30/2020 11:34   DG Toe Great Left  Result Date: 05/30/2020 CLINICAL DATA:  Infection left question sepsis EXAM: LEFT GREAT TOE COMPARISON:  None FINDINGS: Skin irregularity and gas at medial aspect of LEFT great toe consistent with infection by a gas-forming organism. Osseous mineralization normal for technique. Joint spaces preserved. No fracture, dislocation or bone destruction. Specifically, no osseous changes to suggest acute osteomyelitis identified. IMPRESSION: Soft tissue gas identified at medial aspect of LEFT great toe consistent with infection by a gas-forming organism. No radiographic evidence of osteomyelitis; if this remains a clinical concern, consider MR assessment. Findings called to Terance Hart PA on 05/30/2020 at 1140 hours. Electronically Signed   By: Ulyses Southward M.D.   On:  05/30/2020 11:40   VAS [place] ABI WITH/WO TBI  Result Date: 06/01/2020 LOWER EXTREMITY DOPPLER STUDY Indications: Ulceration. Other Factors: Status post left great toe amputation 05/31/20.  Comparison Study: No prior study on file for comparison Performing Technologist: Sherren Kerns RVS  Examination Guidelines: A complete evaluation includes at minimum, Doppler waveform signals and systolic blood pressure reading at the level of bilateral brachial, anterior tibial, and posterior tibial arteries, when vessel segments are accessible. Bilateral testing is considered an integral part of a complete examination. Photoelectric Plethysmograph (PPG) waveforms and toe systolic pressure readings are included as required and additional duplex testing as needed. Limited examinations for reoccurring indications may be performed as noted.  ABI Findings: +---------+------------------+-----+---------+--------+ Right    Rt Pressure (mmHg)IndexWaveform Comment  +---------+------------------+-----+---------+--------+ Brachial 134                    triphasic         +---------+------------------+-----+---------+--------+ PTA      156  1.16 triphasic         +---------+------------------+-----+---------+--------+ DP       172               1.28 triphasic         +---------+------------------+-----+---------+--------+ Great Toe122               0.91                   +---------+------------------+-----+---------+--------+ +---------+------------------+-----+---------+----------+ Left     Lt Pressure (mmHg)IndexWaveform Comment    +---------+------------------+-----+---------+----------+ Brachial 129                    triphasic           +---------+------------------+-----+---------+----------+ PTA      136               1.01 triphasic           +---------+------------------+-----+---------+----------+ DP       171               1.28 triphasic            +---------+------------------+-----+---------+----------+ Great Toe                                amputation +---------+------------------+-----+---------+----------+ 2nd toe  106               0.79                     +---------+------------------+-----+---------+----------+ +-------+-----------+-----------+------------+------------+ ABI/TBIToday's ABIToday's TBIPrevious ABIPrevious TBI +-------+-----------+-----------+------------+------------+ Right  1.28       0.91                                +-------+-----------+-----------+------------+------------+ Left   1.28       0.79                                +-------+-----------+-----------+------------+------------+  Summary: Right: Resting right ankle-brachial index is within normal range. No evidence of significant right lower extremity arterial disease. The right toe-brachial index is normal. Left: Resting left ankle-brachial index is within normal range. No evidence of significant left lower extremity arterial disease. The left toe-brachial index is normal.  *See table(s) above for measurements and observations.  Electronically signed by Fabienne Bruns MD on 06/01/2020 at 12:03:34 PM.    Final    ECHOCARDIOGRAM COMPLETE  Result Date: 06/06/2020    ECHOCARDIOGRAM REPORT   Patient Name:   Sean Carter Date of Exam: 06/06/2020 Medical Rec #:  [id number]     Height:       68.0 in Accession #:    [id number]    Weight:       180.6 lb Date of Birth:  30-Mar-1971    BSA:          1.957 m Patient Age:    48 years      BP:           168/86 mmHg Patient Gender: M             HR:           97 bpm. Exam Location:  Inpatient Procedure: 2D Echo Indications:    fever  History:        Patient has no prior history  of Echocardiogram examinations.                 Risk Factors:Diabetes and Dyslipidemia.  Sonographer:    Sheralyn Boatman RDCS Referring Phys: Lyndel Pleasure Chrissie Noa A HENSEL IMPRESSIONS  1. Global longitudinal strain is -22.2%. Left ventricular  ejection fraction, by estimation, is 65 to 70%. The left ventricle has normal function. The left ventricle has no regional wall motion abnormalities. There is mild left ventricular hypertrophy. Left ventricular diastolic parameters were normal.  2. Right ventricular systolic function is normal. The right ventricular size is normal.  3. The mitral valve is normal in structure. Trivial mitral valve regurgitation.  4. The aortic valve is abnormal. Aortic valve regurgitation is not visualized. Mild aortic valve sclerosis is present, with no evidence of aortic valve stenosis.  5. The inferior vena cava is dilated in size with <50% respiratory variability, suggesting right atrial pressure of 15 mmHg. FINDINGS  Left Ventricle: Global longitudinal strain is -22.2%. Left ventricular ejection fraction, by estimation, is 65 to 70%. The left ventricle has normal function. The left ventricle has no regional wall motion abnormalities. The left ventricular internal cavity size was normal in size. There is mild left ventricular hypertrophy. Left ventricular diastolic parameters were normal. Right Ventricle: The right ventricular size is normal. No increase in right ventricular wall thickness. Right ventricular systolic function is normal. Left Atrium: Left atrial size was normal in size. Right Atrium: Right atrial size was normal in size. Pericardium: There is no evidence of pericardial effusion. Mitral Valve: The mitral valve is normal in structure. Trivial mitral valve regurgitation. Tricuspid Valve: The tricuspid valve is normal in structure. Tricuspid valve regurgitation is trivial. Aortic Valve: The aortic valve is abnormal. Aortic valve regurgitation is not visualized. Mild aortic valve sclerosis is present, with no evidence of aortic valve stenosis. Pulmonic Valve: The pulmonic valve was grossly normal. Pulmonic valve regurgitation is not visualized. Aorta: The aortic root and ascending aorta are structurally normal, with no  evidence of dilitation. Venous: The inferior vena cava is dilated in size with less than 50% respiratory variability, suggesting right atrial pressure of 15 mmHg. IAS/Shunts: No atrial level shunt detected by color flow Doppler.  LEFT VENTRICLE PLAX 2D LVIDd:         4.40 cm     Diastology LVIDs:         2.70 cm     LV e' lateral:   8.05 cm/s LV PW:         1.50 cm     LV E/e' lateral: 10.6 LV IVS:        1.10 cm     LV e' medial:    6.42 cm/s LVOT diam:     1.90 cm     LV E/e' medial:  13.3 LV SV:         63 LV SV Index:   32 LVOT Area:     2.84 cm  LV Volumes (MOD) LV vol d, MOD A2C: 80.0 ml LV vol d, MOD A4C: 71.5 ml LV vol s, MOD A2C: 27.0 ml LV vol s, MOD A4C: 26.9 ml LV SV MOD A2C:     53.0 ml LV SV MOD A4C:     71.5 ml LV SV MOD BP:      49.8 ml RIGHT VENTRICLE             IVC RV S prime:     13.80 cm/s  IVC diam: 2.30 cm TAPSE (M-mode): 2.1 cm LEFT  ATRIUM             Index       RIGHT ATRIUM           Index LA diam:        4.20 cm 2.15 cm/m  RA Area:     10.20 cm LA Vol (A2C):   23.3 ml 11.91 ml/m RA Volume:   18.10 ml  9.25 ml/m LA Vol (A4C):   36.4 ml 18.60 ml/m LA Biplane Vol: 29.3 ml 14.97 ml/m  AORTIC VALVE LVOT Vmax:   142.00 cm/s LVOT Vmean:  84.800 cm/s LVOT VTI:    0.222 m  AORTA Ao Root diam: 3.30 cm Ao Asc diam:  3.25 cm MITRAL VALVE MV Area (PHT): 4.89 cm    SHUNTS MV Decel Time: 155 msec    Systemic VTI:  0.22 m MV E velocity: 85.70 cm/s  Systemic Diam: 1.90 cm MV A velocity: 60.40 cm/s MV E/A ratio:  1.42 Dietrich Pates MD Electronically signed by Dietrich Pates MD Signature Date/Time: 06/06/2020/5:30:26 PM    Final    VAS [place] LOWER EXTREMITY VENOUS (DVT)  Result Date: 06/05/2020  Lower Venous DVTStudy Indications: Pain, Swelling, and Erythema.  Risk Factors: Surgery 05/31/2020 - Ray Amputation of Left Great Toe with Negative Pressure Vac Placement. Limitations: Poor ultrasound/tissue interface and patient pain tolerance. Comparison Study: No prior studies. Performing Technologist: Chanda Busing RVT  Examination Guidelines: A complete evaluation includes B-mode imaging, spectral Doppler, color Doppler, and power Doppler as needed of all accessible portions of each vessel. Bilateral testing is considered an integral part of a complete examination. Limited examinations for reoccurring indications may be performed as noted. The reflux portion of the exam is performed with the patient in reverse Trendelenburg.  +-----+---------------+---------+-----------+----------+--------------+ RIGHTCompressibilityPhasicitySpontaneityPropertiesThrombus Aging +-----+---------------+---------+-----------+----------+--------------+ CFV  Full           Yes      Yes                                 +-----+---------------+---------+-----------+----------+--------------+   +---------+---------------+---------+-----------+----------+--------------+ LEFT     CompressibilityPhasicitySpontaneityPropertiesThrombus Aging +---------+---------------+---------+-----------+----------+--------------+ CFV      Full           Yes      Yes                                 +---------+---------------+---------+-----------+----------+--------------+ SFJ      Full                                                        +---------+---------------+---------+-----------+----------+--------------+ FV Prox  Full                                                        +---------+---------------+---------+-----------+----------+--------------+ FV Mid   Full                                                        +---------+---------------+---------+-----------+----------+--------------+  FV DistalFull                                                        +---------+---------------+---------+-----------+----------+--------------+ PFV      Full                                                        +---------+---------------+---------+-----------+----------+--------------+ POP      Full            Yes      Yes                                 +---------+---------------+---------+-----------+----------+--------------+ PTV      Full                                                        +---------+---------------+---------+-----------+----------+--------------+ PERO     Full                                                        +---------+---------------+---------+-----------+----------+--------------+ GSV      None                                         Acute          +---------+---------------+---------+-----------+----------+--------------+ Superficial thrombosis noted in the GSV originates in the mid thigh and extends into the distal calf.    Summary: RIGHT: - No evidence of common femoral vein obstruction.  LEFT: - Findings consistent with acute superficial vein thrombosis involving the left great saphenous vein. - There is no evidence of deep vein thrombosis in the lower extremity.  - No cystic structure found in the popliteal fossa.  *See table(s) above for measurements and observations. Electronically signed by Gretta Began MD on 06/05/2020 at 5:04:34 PM.    Final      Results/Tests Pending at Time of Discharge: Blood cultures  Discharge Medications:  Allergies as of 06/07/2020      Reactions   Scallops [shellfish Allergy] Anaphylaxis      Medication List    TAKE these medications   atorvastatin 40 MG tablet Commonly known as: LIPITOR Take 1 tablet (40 mg total) by mouth daily.   cephALEXin 500 MG capsule Commonly known as: KEFLEX Take 1 capsule (500 mg total) by mouth 4 (four) times daily for 7 days.   insulin glargine 100 UNIT/ML injection Commonly known as: LANTUS Inject 0.35 mLs (35 Units total) into the skin daily. Start taking on: June 08, 2020   losartan 25 MG tablet Commonly known as: COZAAR Take 0.5 tablets (12.5 mg total) by mouth daily. Notes to patient: To help lower blood pressure.  Also  protects kidneys in patients with high  blood pressure and diabetes.   metFORMIN 500 MG tablet Commonly known as: Glucophage Take 1 tablet (500 mg total) by mouth 2 (two) times daily with a meal. Notes to patient: For diabetes.  Take with food to reduce GI side effects.   Xarelto Starter Pack Generic drug: Rivaroxaban Stater Pack (15 mg and 20 mg) Follow package directions: Take one 15mg  tablet by mouth twice a day. On day 22, switch to one 20mg  tablet once a day. Take with food.   Rivaroxaban 15 MG Tabs tablet Commonly known as: XARELTO Take 1 tablet (15 mg total) by mouth daily.            Durable Medical Equipment  (From admission, onward)         Start     Ordered   06/03/20 1215  For home use only DME 3 n 1  Once       Comments: Patient will require 3 in 1 for home use to include bedside commode, toilet seat and shower seat   06/03/20 1217   06/03/20 0000  For home use only DME standard manual wheelchair with seat cushion       Comments: Patient suffers from T2DM with left great toe amputation which impairs their ability to perform daily activities like bathing, dressing, and toileting in the home.  A walker will not resolve issue with performing activities of daily living. A wheelchair will allow patient to safely perform daily activities. Patient can safely propel the wheelchair in the home or has a caregiver who can provide assistance. Length of need Lifetime. Accessories: elevating leg rests (ELRs), wheel locks, extensions and anti-tippers.   06/03/20 1217           Discharge Care Instructions  (From admission, onward)         Start     Ordered   06/07/20 0000  Discharge wound care:       Comments: Discussed with patient   06/07/20 1419          Discharge Instructions: Please refer to Patient Instructions section of EMR for full details.  Patient was counseled important signs and symptoms that should prompt return to medical care, changes in medications, dietary instructions, activity  restrictions, and follow up appointments.   Follow-Up Appointments:  Follow-up Information    [facility] Go on [facility]/11/2020.   Specialty: Family Medicine Why: Please arrive by 08:15 am for you 08:30am appointment  Contact information: [address] [id number] mc [address] [id number] [phone]       Cephus Shellinglark, Christopher J, MD. Go on 06/25/2020.   Specialty: Vascular Surgery Why: Please arrive by 8:05 AM for your 8:20 AM appointment  Contact information: [address] [place] [id number] [phone]               Evelena LeydenLilland, Alana, DO 06/07/2020, 2:27 PM PGY-1, [facility]

## 2020-06-01 NOTE — Evaluation (Signed)
Physical Therapy Evaluation Patient Details Name: Sean Carter MRN: [id number] DOB: April 04, 1971 Today's Date: 06/01/2020   History of Present Illness  Sean Carter is a 49 y.o. male presenting with 2 weeks of progressively worsening left great toe pain, now with color changes, patient also experiencing flulike symptoms. PMH is significant for type 2 diabetes and seasonal allergies.Pt underwent L ray amputation  Clinical Impression  Patient is s/p above surgery resulting in functional limitations due to the deficits listed below (see PT Problem List). Min assist for short distance ambulation with RW and woundvac. Educated on safe DME use and demonstrated single step navigation with RW which will need to be practiced prior to D/c. He does have some gross weakness and difficulty navigating RW without bearing weight through LLE. Pt will be much more stable when he is able to place his heel on the ground (please update weight bearing status when appropriate and Consider left heel walker shoe / forefoot offloader.)  Patient will benefit from skilled PT to increase their independence and safety with mobility to allow discharge to the venue listed below.       Follow Up Recommendations Home health PT;Supervision for mobility/OOB    Equipment Recommendations  Rolling walker with 5" wheels;Wheelchair (measurements PT);3in1 (PT) Heel-walker boot   Recommendations for Other Services       Precautions / Restrictions Precautions Precautions: Fall;Other (comment) Precaution Comments: wound vac Restrictions Weight Bearing Restrictions: Yes Other Position/Activity Restrictions: NWB L LE      Mobility  Bed Mobility Overal bed mobility: Needs Assistance Bed Mobility: Sit to Supine     Supine to sit: Supervision;HOB elevated Sit to supine: Supervision   General bed mobility comments: Sup for safety, able to mobilize LEs indenpendently with some effort. Assist for line/lead management only.    Transfers Overall transfer level: Needs assistance Equipment used: Rolling walker (2 wheeled) Transfers: Sit to/from [facility] Sit to Stand: Min assist Stand pivot transfers: Min assist       General transfer comment: Min assist for boost to stand first attempt, cues for hand placement. Second time pt performed with close supervision. Pivot with min assist for walker placement. cues for technique and safe proximity .  Ambulation/Gait Ambulation/Gait assistance: Min assist Gait Distance (Feet): 10 Feet Assistive device: Rolling walker (2 wheeled) Gait Pattern/deviations:  (SL hop) Gait velocity: slow   General Gait Details: Min assist to ambulate 5 feet forward and 5 feet backwards. Fairly fatigued with this short distance. Cues for NWB through LLE; walker adjusted for leverage with arms.  Stairs Stairs: Yes Stairs assistance:  (Demonstrated only) Stair Management: No rails;Backwards;With walker Number of Stairs: 1 General stair comments: Demonstrated at end of session to prep for next visit.  Wheelchair Mobility    Modified Rankin (Stroke Patients Only)       Balance Overall balance assessment: Needs assistance Sitting-balance support: No upper extremity supported;Feet supported Sitting balance-Leahy Scale: Fair     Standing balance support: Bilateral upper extremity supported;During functional activity Standing balance-Leahy Scale: Poor Standing balance comment: reliant on B UE support on RW or Min A from therapist for transitional movements                             Pertinent Vitals/Pain Pain Assessment: Faces Faces Pain Scale: Hurts little more Pain Location: L LE Pain Descriptors / Indicators: Aching;Sore Pain Intervention(s): Monitored during session;Repositioned    Home Living Family/patient expects to  be discharged to:: Private residence Living Arrangements: Spouse/significant other Available Help at Discharge: Family  (girlfriend works from home) Type of Home: House (duplex) Home Access: Stairs to enter Entrance Stairs-Rails: None Entrance Stairs-Number of Steps: 1 (1 + platform big enough for RW +1 more to enter) Home Layout: One level Home Equipment: None      Prior Function Level of Independence: Independent         Comments: Independent in all ADLs, IADLs and mobility. Pt did not use AD. Pt works full time as a Film/video editor: Right    Extremity/Trunk Assessment   Upper Extremity Assessment Upper Extremity Assessment: Defer to OT evaluation    Lower Extremity Assessment Lower Extremity Assessment: Generalized weakness;LLE deficits/detail LLE Deficits / Details: wound vac on residual 1st met    Cervical / Trunk Assessment Cervical / Trunk Assessment: Normal  Communication   Communication: No difficulties  Cognition Arousal/Alertness: Awake/alert Behavior During Therapy: WFL for tasks assessed/performed Overall Cognitive Status: Within Functional Limits for tasks assessed                                        General Comments General comments (skin integrity, edema, etc.): VS stable, - HR 110s, SPO2 96% on room air, intermittent couging noted.    Exercises     Assessment/Plan    PT Assessment Patient needs continued PT services  PT Problem List Decreased strength;Decreased activity tolerance;Decreased balance;Decreased mobility;Decreased knowledge of use of DME;Pain       PT Treatment Interventions DME instruction;Gait training;Stair training;Functional mobility training;Therapeutic activities;Therapeutic exercise;Balance training;Patient/family education;Wheelchair mobility training    PT Goals (Current goals can be found in the Care Plan section)  Acute Rehab PT Goals Patient Stated Goal: be able to manage on my own PT Goal Formulation: With patient Time For Goal Achievement: 06/15/20 Potential to Achieve Goals: Good     Frequency Min 3X/week   Barriers to discharge Decreased caregiver support;Inaccessible home environment narrow doors (30") however w/c should be able to tightly navigate this. SO may not be available 24/7    Co-evaluation               AM-PAC PT "6 Clicks" Mobility  Outcome Measure Help needed turning from your back to your side while in a flat bed without using bedrails?: None Help needed moving from lying on your back to sitting on the side of a flat bed without using bedrails?: None Help needed moving to and from a bed to a chair (including a wheelchair)?: A Little Help needed standing up from a chair using your arms (e.g., wheelchair or bedside chair)?: A Little Help needed to walk in hospital room?: A Little Help needed climbing 3-5 steps with a railing? : A Lot 6 Click Score: 19    End of Session Equipment Utilized During Treatment: Gait belt Activity Tolerance: Patient tolerated treatment well Patient left: in bed;with call bell/phone within reach;with bed alarm set;with family/visitor present   PT Visit Diagnosis: Unsteadiness on feet (R26.81);Other abnormalities of gait and mobility (R26.89);Muscle weakness (generalized) (M62.81);Difficulty in walking, not elsewhere classified (R26.2);Pain Pain - Right/Left: Left Pain - part of body: Ankle and joints of foot    Time: 2426-8341 PT Time Calculation (min) (ACUTE ONLY): 30 min   Charges:   PT Evaluation $PT Eval Low Complexity: 1 Low PT Treatments $Gait Training: 8-22  mins        Elayne Snare, [place], DPT  Ellouise Newer 06/01/2020, 1:13 PM

## 2020-06-01 NOTE — Progress Notes (Signed)
[facility]  Subjective  - feels ok    Objective (!) 155/84 82 99 F (37.2 C) (Oral) 17 99%  Intake/Output Summary (Last 24 hours) at 06/01/2020 0958 Last data filed at 06/01/2020 6770 Gross per 24 hour  Intake 1388.21 ml  Output 605 ml  Net 783.21 ml   VAC in place left first toe, foot pink warm some edema unable to palpate DP pulse this morning  Assessment/Planning: S/p toe amp yesterday continue VAC will need home VAC  ABIs apparently pending if the numbers are reasonable he should be able to go home from my standpoint  Fabienne Bruns 06/01/2020 9:58 AM --  Laboratory Lab Results: Recent Labs    05/31/20 0108 06/01/20 0231  WBC 14.8* 13.9*  HGB 10.2* 9.5*  HCT 29.9* 27.9*  PLT 157 159   BMET Recent Labs    05/31/20 0108 06/01/20 0231  NA 130* 127*  K 3.0* 3.6  CL 91* 93*  CO2 23 24  GLUCOSE 233* 324*  BUN 33* 23*  CREATININE 1.11 0.86  CALCIUM 8.1* 7.6*    COAG Lab Results  Component Value Date   INR 1.1 05/30/2020   INR 0.9 01/10/2008   No results found for: PTT

## 2020-06-01 NOTE — Evaluation (Addendum)
Occupational Therapy Evaluation Patient Details Name: Sean Carter MRN: [id number] DOB: January 05, 1971 Today's Date: 06/01/2020    History of Present Illness Sean Carter is a 49 y.o. male presenting with 2 weeks of progressively worsening left great toe pain, now with color changes, patient also experiencing flulike symptoms. PMH is significant for type 2 diabetes and seasonal allergies.Pt underwent L ray amputation   Clinical Impression   PTA, pt lives with wife (who works from home) and reports Independence in all ADLs, IADLs and mobility without AD. Pt works full time as a Biomedical scientist. Pt presents now with deficits in standing balance and endurance with limitations in independence secondary to NWB status of L LE. Pt requires Min A for ADL transfers with RW with cues for safe techniques. Pt requires Min A for LB ADLs in standing due to balance deficits. Extended time spent with pt and pt's significant other problem-solving home setup and ADL routine. If significant other can confirm availability for physical assist, recommend home with [facility]. Pt reports MD plans to upgrade WB status in 1-2 days. If able to WB through heel with darco shoe, pt's independence would be significantly improved. If WB status upgraded, wheelchair will no longer be needed.    Follow Up Recommendations  Home health OT;Supervision/Assistance for LB ADLs and mobility   Equipment Recommendations  3 in 1 bedside commode;Tub/shower bench;Wheelchair (measurements OT);Wheelchair cushion (measurements OT);Other (comment) (RW, drop arm BSC)    Recommendations for Other Services       Precautions / Restrictions Precautions Precautions: Fall;Other (comment) Precaution Comments: wound vac Restrictions Weight Bearing Restrictions: Yes Other Position/Activity Restrictions: NWB L LE      Mobility Bed Mobility Overal bed mobility: Needs Assistance Bed Mobility: Sit to Supine     Supine to sit: Supervision;HOB elevated Sit to  supine: Supervision   General bed mobility comments: Sup for safety, able to mobilize LEs indenpendently with some effort. Assist for line/lead management only.   Transfers Overall transfer level: Needs assistance Equipment used: Rolling walker (2 wheeled) Transfers: Sit to/from [facility] Sit to Stand: Min assist Stand pivot transfers: Min assist       General transfer comment: Min assist for boost to stand first attempt, cues for hand placement. Second time pt performed with close supervision. Pivot with min assist for walker placement. cues for technique and safe proximity .    Balance Overall balance assessment: Needs assistance Sitting-balance support: No upper extremity supported;Feet supported Sitting balance-Leahy Scale: Fair     Standing balance support: Bilateral upper extremity supported;During functional activity Standing balance-Leahy Scale: Poor Standing balance comment: reliant on B UE support on RW or Min A from therapist for transitional movements                           ADL either performed or assessed with clinical judgement   ADL Overall ADL's : Needs assistance/impaired Eating/Feeding: Independent;Sitting   Grooming: Set up;Sitting   Upper Body Bathing: Independent;Sitting   Lower Body Bathing: Minimal assistance;Sit to/from stand   Upper Body Dressing : Independent;Sitting   Lower Body Dressing: Minimal assistance;Sit to/from stand Lower Body Dressing Details (indicate cue type and reason): Able to don R sock sitting EOB without assistance. Will require Min A in standing to maintain balance Toilet Transfer: Minimal assistance;Stand-pivot;BSC;RW Toilet Transfer Details (indicate cue type and reason): Min A for SPT to/from [facility] with RW. Assistance needed to maintain balance and cues for RW mgmt Toileting-  Clothing Manipulation and Hygiene: Minimal assistance;Sit to/from stand Toileting - Clothing Manipulation Details  (indicate cue type and reason): Min A for clothing mgmt in standing due to decreased balance      Functional mobility during ADLs: Minimal assistance;Rolling walker;Cueing for safety;Cueing for sequencing General ADL Comments: Pt with decreased dynamic balance, decreased endurance impacting ability to complete ADLs/transfers/mobility without physical assist     Vision Baseline Vision/History: No visual deficits Patient Visual Report: No change from baseline Vision Assessment?: No apparent visual deficits     Perception     Praxis      Pertinent Vitals/Pain Pain Assessment: Faces Faces Pain Scale: Hurts little more Pain Location: L LE Pain Descriptors / Indicators: Aching;Sore Pain Intervention(s): Monitored during session;Repositioned     Hand Dominance Right   Extremity/Trunk Assessment Upper Extremity Assessment Upper Extremity Assessment: Defer to OT evaluation   Lower Extremity Assessment Lower Extremity Assessment: Generalized weakness;LLE deficits/detail LLE Deficits / Details: wound vac on residual 1st met   Cervical / Trunk Assessment Cervical / Trunk Assessment: Normal   Communication Communication Communication: No difficulties   Cognition Arousal/Alertness: Awake/alert Behavior During Therapy: WFL for tasks assessed/performed Overall Cognitive Status: Within Functional Limits for tasks assessed                                     General Comments  VS stable, - HR 110s, SPO2 96% on room air, intermittent couging noted.    Exercises     Shoulder Instructions      Home Living Family/patient expects to be discharged to:: Private residence Living Arrangements: Spouse/significant other Available Help at Discharge: Family (girlfriend works from home) Type of Home: House (duplex) Home Access: Stairs to enter [employer] of Steps: 1 (1 + platform big enough for RW +1 more to enter) Entrance Stairs-Rails: None Home Layout: One  level     Bathroom Shower/Tub: Teacher, early years/pre: Standard     Home Equipment: None          Prior Functioning/Environment Level of Independence: Independent        Comments: Independent in all ADLs, IADLs and mobility. Pt did not use AD. Pt works full time as a Architect Problem List: Decreased strength;Decreased activity tolerance;Impaired balance (sitting and/or standing);Decreased knowledge of use of DME or AE;Pain      OT Treatment/Interventions: Self-care/ADL training;Therapeutic exercise;Energy conservation;DME and/or AE instruction;Therapeutic activities;Visual/perceptual remediation/compensation;Balance training    OT Goals(Current goals can be found in the care plan section) Acute Rehab OT Goals Patient Stated Goal: be able to manage on my own OT Goal Formulation: With patient/family Time For Goal Achievement: 06/15/20 Potential to Achieve Goals: Good ADL Goals Pt Will Perform Grooming: with supervision;standing Pt Will Perform Lower Body Bathing: with supervision;sit to/from stand Pt Will Perform Lower Body Dressing: with supervision;sit to/from stand Pt Will Transfer to Toilet: with min guard assist;ambulating;bedside commode Pt Will Perform Toileting - Clothing Manipulation and hygiene: with supervision;sit to/from stand;sitting/lateral leans Pt Will Perform Tub/Shower Transfer: with min guard assist;ambulating;tub bench;rolling walker  OT Frequency: Min 2X/week   Barriers to D/C:            Co-evaluation              AM-PAC OT "6 Clicks" Daily Activity     Outcome Measure Help from another person eating meals?: None Help from another person  taking care of personal grooming?: A Little Help from another person toileting, which includes using toliet, bedpan, or urinal?: A Little Help from another person bathing (including washing, rinsing, drying)?: A Little Help from another person to put on and taking off regular upper body  clothing?: A Little Help from another person to put on and taking off regular lower body clothing?: A Little 6 Click Score: 19   End of Session Equipment Utilized During Treatment: Gait belt;Rolling walker Nurse Communication: Mobility status  Activity Tolerance: Patient tolerated treatment well Patient left: in chair;with call bell/phone within reach;with family/visitor present  OT Visit Diagnosis: Unsteadiness on feet (R26.81);Other abnormalities of gait and mobility (R26.89);Muscle weakness (generalized) (M62.81);Pain Pain - Right/Left: Left Pain - part of body: Ankle and joints of foot                Time: 1110-1156 OT Time Calculation (min): 46 min Charges:  OT General Charges $OT Visit: 1 Visit OT Evaluation $OT Eval Moderate Complexity: 1 Mod OT Treatments $Self Care/Home Management : 23-37 mins  Layla Maw, OTR/L  Layla Maw 06/01/2020, 1:31 PM

## 2020-06-01 NOTE — Progress Notes (Signed)
VASCULAR LAB    ABIs completed.    Preliminary report:  See CV proc for preliminary results.  Eleaner Dibartolo, RVT 06/01/2020, 10:23 AM

## 2020-06-01 NOTE — Progress Notes (Addendum)
Family Medicine Teaching Service Daily Progress Note Intern Pager: [phone]  Patient name: Sean Carter Medical record number: [id number] Date of birth: 17-Sep-1971 Age: 49 y.o. Gender: male  Primary Care Provider: Patient, No Pcp Per Consultants: Ortho, vascular surgery Code Status: Full  Pt Overview and Major Events to Date:  Admitted 8/12  Assessment and Plan: Sean Carter a 48 y.o.malepresenting with 2 weeks of progressively worsening left great toe pain, now with color changes,patientalso experiencing flulike symptoms. PMH is significant fortype 2 diabetes and seasonal allergies.  Gas gangrene ofleftfoot,first digit s/p left great toe amputation POD #1.  Antibiotics narrowed to penicillin yesterday for group B strep coverage.  Patient with negative pressure wound VAC.  About 50 cc in drain.  WBC decreased from 14.8 yesterday to 13.9 this morning.  Denies any pain.  Patient is interested in COVID-19 vaccination, prefers Moderna. -Ortho consulted, appreciate recommendations.  -Vascular consulted, appreciate recommendations - s/p IV cefepime, vancomycin, Flagyl (8/12-8/13) -Continue Penicillin G IV q4h (8/13-) -F/U lower extremity vascular ultrasound with ABI - Daily CBC, BMP -Vitals per floor routine -PT/OT eval and treat - COVID-19 Moderna Vaccine  T2DM: chronic, uncontrolled Not on home medications. Repeat A1c 11.3. CBG's 237-324 overnight. Required 13U insulin aspart yesterday.  - daily CBG's - moderate sliding scale insulin - add 5U Lantus  - Carb modified diet  -We will plan to discharge home with diabetes regimen and close follow-up outpatient - Diabetes coordinator while inpatient  Protein Calorie Malnutrition Albumin 2, Total protein 5. Suspect this is from poor dietary habits. Will plan to get nutrition consult today, nutrition supplementations. - CMP - Nutrition consult  Hyperlipidemia: Chronic, stable Lipid panel 8/12: TG 247, HDL <10, VLDL  49, LDL not calculated, total cholesterol 125. ASCVD risk 24.2%. - Continue Atorvastatin 20mg    - Counseled on dietary modifications  HTN: Stable Not on any home medications. This AM BP: 138/77 -Continue to monitor - No medications at this time - Start Losartan 12.5mg    Normocytic Anemia: Stable Hemoglobin 11.5>10.2>9.5.  Decrease expected since he is POD #1, will continue to monitor. Asymptomatic.  - Continue to monitor - Daily CBC  Hyponatremia: Acute, worsening Sodium 131>130>127.  Did have some diarrhea yesterday. Could be pseudohyponatremia from elevated BG. Suspect will normalize more with improved PO intake. -Daily BMP - Continue to monitor, better BG control - Consider IVF if worsens  Hypokalemia: Acute, resolved Potassium 3.6 this AM.  - Daily BMP -Can replete as needed  Elevated creatinine: acute, resolved 1.35>1.11>0.86 Unknown baseline. -Avoid nephrotoxic medications - Daily BMP   FEN/GI: Carb modified PPx: Heparin subcutaneous  Disposition: Continue IV abx inpatient  Subjective:  Patient feels well this morning. He does not have any pain or complaints. He is interested in [employer] COVID-19 vaccine. Is also interested in understand diabetes more and how he can better control it. States that he has lost weight but his diet isn't the best, he eats a lot of bacon. He works as a [employer] and is on his feet all day. He feels as though he exercises plenty but is concerned about his balance with his recent surgery.   Objective: Temp:  [98 F (36.7 C)-99.1 F (37.3 C)] 98 F (36.7 C) (08/13 2300) Pulse Rate:  [90-104] 95 (08/13 2300) Resp:  [14-27] 20 (08/13 2300) BP: (138-163)/(74-96) 138/77 (08/13 2300) SpO2:  [93 %-100 %] 96 % (08/13 2300) Physical Exam: General: Alert, awake, no acute distress Cardiovascular: RRR Respiratory: CTAB, no increased work of breathing, speaking  in full sentences Abdomen: non-distended Extremities: would vac placed to left great  toe, about 50cc serosanguinous fluid in drain. Non-tender, 1+ DP left foot, 2+ DP right foot. 1-2+ pitting edema LLE, no edema to RLE       Laboratory: Recent Labs  Lab 05/30/20 0955 05/31/20 0108 06/01/20 0231  WBC 13.4* 14.8* 13.9*  HGB 11.5* 10.2* 9.5*  HCT 34.5* 29.9* 27.9*  PLT 214 157 159   Recent Labs  Lab 05/30/20 0955 05/31/20 0108 06/01/20 0231  NA 131* 130* 127*  K 3.4* 3.0* 3.6  CL 90* 91* 93*  CO2 27 23 24   BUN 42* 33* 23*  CREATININE 1.35* 1.11 0.86  CALCIUM 8.9 8.1* 7.6*  PROT 6.6 5.4* 5.0*  BILITOT 0.7 1.7* 0.7  ALKPHOS 120 129* 108  ALT 42 40 45*  AST 46* 49* 53*  GLUCOSE 320* 233* 324*    Imaging/Diagnostic Tests: None new  , DO 06/01/2020, 7:59 AM PGY-1, [facility] Family [facility] FPTS Intern pager: [phone], text pages welcome

## 2020-06-01 NOTE — Progress Notes (Signed)
PT Cancellation Note  Patient Details Name: Sean Carter MRN: [id number] DOB: August 17, 1971   Cancelled Treatment:    Reason Eval/Treat Not Completed: Patient at procedure or test/unavailable  Currently off unit for testing.  Will follow-up again later today for evaluation and recommendations.    Berton Mount, PT, DPT 06/01/2020, 10:09 AM

## 2020-06-02 DIAGNOSIS — R6520 Severe sepsis without septic shock: Secondary | ICD-10-CM

## 2020-06-02 DIAGNOSIS — L089 Local infection of the skin and subcutaneous tissue, unspecified: Secondary | ICD-10-CM

## 2020-06-02 DIAGNOSIS — A419 Sepsis, unspecified organism: Secondary | ICD-10-CM | POA: Diagnosis present

## 2020-06-02 DIAGNOSIS — M726 Necrotizing fasciitis: Secondary | ICD-10-CM

## 2020-06-02 LAB — COMPREHENSIVE METABOLIC PANEL
ALT: 47 U/L — ABNORMAL HIGH (ref 0–44)
AST: 49 U/L — ABNORMAL HIGH (ref 15–41)
Albumin: 1.9 g/dL — ABNORMAL LOW (ref 3.5–5.0)
Alkaline Phosphatase: 124 U/L (ref 38–126)
Anion gap: 8 (ref 5–15)
BUN: 23 mg/dL — ABNORMAL HIGH (ref 6–20)
CO2: 28 mmol/L (ref 22–32)
Calcium: 7.7 mg/dL — ABNORMAL LOW (ref 8.9–10.3)
Chloride: 93 mmol/L — ABNORMAL LOW (ref 98–111)
Creatinine, Ser: 0.94 mg/dL (ref 0.61–1.24)
GFR calc Af Amer: >60 mL/min (ref >60)
GFR calc non Af Amer: >60 mL/min (ref >60)
Glucose, Bld: 318 mg/dL — ABNORMAL HIGH (ref 70–99)
Potassium: 3.6 mmol/L (ref 3.5–5.1)
Sodium: 129 mmol/L — ABNORMAL LOW (ref 135–145)
Total Bilirubin: 0.4 mg/dL (ref 0.3–1.2)
Total Protein: 5.1 g/dL — ABNORMAL LOW (ref 6.5–8.1)

## 2020-06-02 LAB — CULTURE, BLOOD (ROUTINE X 2)
Special Requests: ADEQUATE
Special Requests: ADEQUATE

## 2020-06-02 LAB — CBC
HCT: 27.3 % — ABNORMAL LOW (ref 39.0–52.0)
Hemoglobin: 9 g/dL — ABNORMAL LOW (ref 13.0–17.0)
MCH: 28 pg (ref 26.0–34.0)
MCHC: 33 g/dL (ref 30.0–36.0)
MCV: 84.8 fL (ref 80.0–100.0)
Platelets: 189 10*3/uL (ref 150–400)
RBC: 3.22 MIL/uL — ABNORMAL LOW (ref 4.22–5.81)
RDW: 13.1 % (ref 11.5–15.5)
WBC: 17.8 10*3/uL — ABNORMAL HIGH (ref 4.0–10.5)
nRBC: 0 % (ref 0.0–0.2)

## 2020-06-02 LAB — GLUCOSE, CAPILLARY
Glucose-Capillary: 293 mg/dL — ABNORMAL HIGH (ref 70–99)
Glucose-Capillary: 286 mg/dL — ABNORMAL HIGH (ref 70–99)
Glucose-Capillary: 294 mg/dL — ABNORMAL HIGH (ref 70–99)

## 2020-06-02 MED ORDER — INSULIN GLARGINE 100 UNIT/ML ~~LOC~~ SOLN
10.0000 [IU] | Freq: Every day | SUBCUTANEOUS | Status: DC
  Administered 2020-06-02: 10 [IU] via SUBCUTANEOUS
  Filled 2020-06-02: qty 0.1

## 2020-06-02 MED ORDER — INSULIN GLARGINE 100 UNIT/ML ~~LOC~~ SOLN
15.0000 [IU] | Freq: Every day | SUBCUTANEOUS | Status: DC
  Administered 2020-06-03 – 2020-06-04 (×2): 15 [IU] via SUBCUTANEOUS
  Filled 2020-06-02 (×2): qty 0.15

## 2020-06-02 MED ORDER — INSULIN GLARGINE 100 UNIT/ML ~~LOC~~ SOLN
8.0000 [IU] | Freq: Every day | SUBCUTANEOUS | Status: DC
  Filled 2020-06-02: qty 0.08

## 2020-06-02 NOTE — Progress Notes (Signed)
ABIs essentially normal yesterday.  Ok to d/c from my standpoint will arrange follow up with Dr Chestine Spore in 2-3 weeks  Fabienne Bruns, MD [facility] Office: [phone]

## 2020-06-02 NOTE — Progress Notes (Signed)
Family Medicine Teaching Service Daily Progress Note Intern Pager: [phone]  Patient name: Sean Carter Medical record number: [id number] Date of birth: 23-May-1971 Age: 49 y.o. Gender: male  Primary Care Provider: Patient, No Pcp Per Consultants: Ortho, vascular surgery Code Status: Full  Pt Overview and Major Events to Date:  Admitted 8/12  Assessment and Plan: Sean Carter a 48 y.o.malepresenting with 2 weeks of progressively worsening left great toe pain, now with color changes,patientalso experiencing flulike symptoms. PMH is significant fortype 2 diabetes and seasonal allergies.  Gas gangrene ofleftfoot,first digit s/p left great toe amputation POD #2. WBC  17.8 today, increasing from 13.9 yesterday however no fevers overnight and patient is clinically improving. narrows antibiotics to Penicillin yesterday to cover for Group B strep.  S/p IV cefepime, vancomycin, Flagyl (8/12-8/13).  Wound VAC over left toe draining serosanguinous fluid. Tender on palpation. 2+ pitting edema of LE. No RE edema.  Warm/ well perfused.  -Ortho consulted, appreciate recommendations. LW vascular US yesterday showed right and left ABI wnl. -Vascular consulted, appreciate recommendations -Continue Penicillin G IV q4h (8/13-) -Daily CBC, BMP -Vitals per floor routine -PT/OT eval and treat -COVID-19 Moderna Vaccine  T2DM: chronic, uncontrolled CBG overnight 241-270. A1c 11.3 indicating poor control. Pt is not on any diabetic medications at home.  -Carb modified diet  -Daily CBG's -Moderate sliding scale insulin -Increase Lantus to 10 units todayt  -We will plan to discharge home with diabetes regimen and close follow-up outpatient -Diabetes coordinator while inpatient  Protein Calorie Malnutrition Albumin 1.9, Total protein 5.1 likely secondary to poor nutrition. Pending nutrition consult  -CMP -Nutrition consult  Hyperlipidemia: Chronic, stable Lipid panel 8/12: TG 247, HDL  <10, VLDL 49, LDL not calculated, total cholesterol 125. ASCVD risk 24.2%. - Continue Atorvastatin 20mg    - Counseled on dietary modifications  HTN: Stable BP 111/66. Not on home anti-hypertensives. Losartan 12.5mg  started yesterday. -Continue to monitor BPs -Continue Losartan 12.5mg    Normocytic Anemia: Stable Hemoglobin 11.5>10.2>9.5>9 likely in setting of recent surgery. Hb threshold is 7. No known hx of CAD.  - Continue to monitor - Daily CBC  Hyponatremia: Acute, improving  Sodium 131>130>127>129.   -Daily BMP - Continue to monitor, better BG control  Hypokalemia: Acute, resolved K 3.6, 3.6 yesterday  -Daily BMP -Replete as necessary  Elevated creatinine: resolved 1.35>1.11>0.86>0.94.  -Avoid nephrotoxic medications - Daily BMP  FEN/GI: Carb modified PPx: Heparin subcutaneous  Disposition: Continue IV abx inpatient  Subjective:  Pt reports on going mild pain where toe was amputation. RN had given pt oxycodone before I came to see patient. Reports swelling and redness on left LE has improved.   Denies fevers.    Objective: Temp:  [98.5 F (36.9 C)-100.1 F (37.8 C)] 98.5 F (36.9 C) (08/15 0319) Pulse Rate:  [64-93] 66 (08/15 0319) Resp:  [16-21] 20 (08/15 0319) BP: (101-155)/(60-84) 111/66 (08/15 0319) SpO2:  [95 %-100 %] 95 % (08/15 0319)    Physical Exam: General: Alert, cooperative, no acute distress Cardio: Normal S1 and S2, RRR  Pulm: CTAB, Normal respiratory effort Abdomen: Bowel sounds normal. Abdomen soft and non-tender.  Extremities: Wound VAC over left toe draining serosanguinous fluid. Tender on palpation. 2+ pitting edema of LE. No RE edema.  Warm/ well perfused.  Neuro: Cranial nerves grossly intact         Laboratory: Recent Labs  Lab 05/31/20 0108 06/01/20 0231 06/02/20 0125  WBC 14.8* 13.9* 17.8*  HGB 10.2* 9.5* 9.0*  HCT 29.9* 27.9* 27.3*  PLT 157 159 189   Recent Labs  Lab 05/31/20 0108 06/01/20 0231  06/02/20 0125  NA 130* 127* 129*  K 3.0* 3.6 3.6  CL 91* 93* 93*  CO2 23 24 28   BUN 33* 23* 23*  CREATININE 1.11 0.86 0.94  CALCIUM 8.1* 7.6* 7.7*  PROT 5.4* 5.0* 5.1*  BILITOT 1.7* 0.7 0.4  ALKPHOS 129* 108 124  ALT 40 45* 47*  AST 49* 53* 49*  GLUCOSE 233* 324* 318*    Imaging/Diagnostic Tests: VAS ABI WITH/WO TBI  Result Date: 06/01/2020 LOWER EXTREMITY DOPPLER STUDY Indications: Ulceration. Other Factors: Status post left great toe amputation 05/31/20.  Comparison Study: No prior study on file for comparison Performing Technologist: 06/02/20 RVS  Examination Guidelines: A complete evaluation includes at minimum, Doppler waveform signals and systolic blood pressure reading at the level of bilateral brachial, anterior tibial, and posterior tibial arteries, when vessel segments are accessible. Bilateral testing is considered an integral part of a complete examination. Photoelectric Plethysmograph (PPG) waveforms and toe systolic pressure readings are included as required and additional duplex testing as needed. Limited examinations for reoccurring indications may be performed as noted.  ABI Findings: +---------+------------------+-----+---------+--------+  Right     Rt Pressure (mmHg) Index Waveform  Comment   +---------+------------------+-----+---------+--------+  Brachial  134                      triphasic           +---------+------------------+-----+---------+--------+  PTA       156                1.16  triphasic           +---------+------------------+-----+---------+--------+  DP        172                1.28  triphasic           +---------+------------------+-----+---------+--------+  Great Toe 122                0.91                      +---------+------------------+-----+---------+--------+ +---------+------------------+-----+---------+----------+  Left      Lt Pressure (mmHg) Index Waveform  Comment     +---------+------------------+-----+---------+----------+  Brachial   129                      triphasic             +---------+------------------+-----+---------+----------+  PTA       136                1.01  triphasic             +---------+------------------+-----+---------+----------+  DP        171                1.28  triphasic             +---------+------------------+-----+---------+----------+  Great Toe                                    amputation  +---------+------------------+-----+---------+----------+  2nd toe   106                0.79                        +---------+------------------+-----+---------+----------+ +-------+-----------+-----------+------------+------------+  ABI/TBI Today's ABI Today's TBI Previous ABI Previous TBI  +-------+-----------+-----------+------------+------------+  Right   1.28        0.91                                   +-------+-----------+-----------+------------+------------+  Left    1.28        0.79                                   +-------+-----------+-----------+------------+------------+  Summary: Right: Resting right ankle-brachial index is within normal range. No evidence of significant right lower extremity arterial disease. The right toe-brachial index is normal. Left: Resting left ankle-brachial index is within normal range. No evidence of significant left lower extremity arterial disease. The left toe-brachial index is normal.  *See table(s) above for measurements and observations.  Electronically signed by Fabienne Bruns MD on 06/01/2020 at 12:03:34 PM.    Final     Towanda Octave, MD 06/02/2020, 6:45 AM PGY-2, [facility] Family Medicine FPTS Intern pager: [phone], text pages welcome

## 2020-06-02 NOTE — Plan of Care (Signed)
Discussed with patient plan of care for the evening, pain management and wound vac with some teach back displayed. 

## 2020-06-03 ENCOUNTER — Telehealth: Payer: Self-pay | Admitting: Vascular Surgery

## 2020-06-03 ENCOUNTER — Inpatient Hospital Stay: Payer: Self-pay

## 2020-06-03 DIAGNOSIS — Z23 Encounter for immunization: Secondary | ICD-10-CM

## 2020-06-03 DIAGNOSIS — B955 Unspecified streptococcus as the cause of diseases classified elsewhere: Secondary | ICD-10-CM

## 2020-06-03 DIAGNOSIS — R7881 Bacteremia: Secondary | ICD-10-CM

## 2020-06-03 DIAGNOSIS — B951 Streptococcus, group B, as the cause of diseases classified elsewhere: Secondary | ICD-10-CM

## 2020-06-03 LAB — COMPREHENSIVE METABOLIC PANEL
ALT: 39 U/L (ref 0–44)
AST: 31 U/L (ref 15–41)
Albumin: 2 g/dL — ABNORMAL LOW (ref 3.5–5.0)
Alkaline Phosphatase: 126 U/L (ref 38–126)
Anion gap: 11 (ref 5–15)
BUN: 17 mg/dL (ref 6–20)
CO2: 27 mmol/L (ref 22–32)
Calcium: 7.6 mg/dL — ABNORMAL LOW (ref 8.9–10.3)
Chloride: 90 mmol/L — ABNORMAL LOW (ref 98–111)
Creatinine, Ser: 0.82 mg/dL (ref 0.61–1.24)
GFR calc Af Amer: >60 mL/min (ref >60)
GFR calc non Af Amer: >60 mL/min (ref >60)
Glucose, Bld: 319 mg/dL — ABNORMAL HIGH (ref 70–99)
Potassium: 4.1 mmol/L (ref 3.5–5.1)
Sodium: 128 mmol/L — ABNORMAL LOW (ref 135–145)
Total Bilirubin: 0.7 mg/dL (ref 0.3–1.2)
Total Protein: 5.2 g/dL — ABNORMAL LOW (ref 6.5–8.1)

## 2020-06-03 LAB — CBC
HCT: 27 % — ABNORMAL LOW (ref 39.0–52.0)
Hemoglobin: 9 g/dL — ABNORMAL LOW (ref 13.0–17.0)
MCH: 28.8 pg (ref 26.0–34.0)
MCHC: 33.3 g/dL (ref 30.0–36.0)
MCV: 86.5 fL (ref 80.0–100.0)
Platelets: 265 10*3/uL (ref 150–400)
RBC: 3.12 MIL/uL — ABNORMAL LOW (ref 4.22–5.81)
RDW: 13.1 % (ref 11.5–15.5)
WBC: 17.9 10*3/uL — ABNORMAL HIGH (ref 4.0–10.5)
nRBC: 0 % (ref 0.0–0.2)

## 2020-06-03 LAB — GLUCOSE, CAPILLARY
Glucose-Capillary: 345 mg/dL — ABNORMAL HIGH (ref 70–99)
Glucose-Capillary: 348 mg/dL — ABNORMAL HIGH (ref 70–99)
Glucose-Capillary: 246 mg/dL — ABNORMAL HIGH (ref 70–99)

## 2020-06-03 MED ORDER — ATORVASTATIN CALCIUM 40 MG PO TABS: 40.0000 mg | ORAL_TABLET | Freq: Every day | ORAL | 0 refills | Status: DC

## 2020-06-03 MED ORDER — LIVING WELL WITH DIABETES BOOK
Freq: Once | Status: AC
  Filled 2020-06-03: qty 1

## 2020-06-03 MED ORDER — INSULIN GLARGINE 100 UNIT/ML ~~LOC~~ SOLN: 20.0000 [IU] | Freq: Every day | SUBCUTANEOUS | 0 refills | Status: DC

## 2020-06-03 MED ORDER — GUAIFENESIN 100 MG/5ML PO SOLN
5.0000 mL | ORAL | Status: DC | PRN
  Administered 2020-06-03 – 2020-06-06 (×4): 100 mg via ORAL
  Filled 2020-06-03 (×6): qty 5

## 2020-06-03 MED ORDER — AMOXICILLIN 500 MG PO CAPS: 500.0000 mg | ORAL_CAPSULE | Freq: Three times a day (TID) | ORAL | 0 refills | Status: DC

## 2020-06-03 MED ORDER — ATORVASTATIN CALCIUM 20 MG PO TABS: 40.0000 mg | ORAL_TABLET | Freq: Every day | ORAL | 0 refills | Status: DC

## 2020-06-03 MED ORDER — OXYCODONE HCL 5 MG PO TABS: 5.0000 mg | ORAL_TABLET | Freq: Four times a day (QID) | ORAL | 0 refills | Status: DC | PRN

## 2020-06-03 MED ORDER — JUVEN PO PACK
1.0000 | PACK | Freq: Two times a day (BID) | ORAL | Status: DC
  Administered 2020-06-03 – 2020-06-07 (×7): 1 via ORAL
  Filled 2020-06-03 (×7): qty 1

## 2020-06-03 MED ORDER — METFORMIN HCL 500 MG PO TABS: 500.0000 mg | ORAL_TABLET | Freq: Two times a day (BID) | ORAL | 0 refills | Status: DC

## 2020-06-03 MED ORDER — AMOXICILLIN 500 MG PO CAPS
500.0000 mg | ORAL_CAPSULE | Freq: Three times a day (TID) | ORAL | Status: DC
  Administered 2020-06-03 – 2020-06-05 (×5): 500 mg via ORAL
  Filled 2020-06-03 (×7): qty 1

## 2020-06-03 MED ORDER — ENSURE MAX PROTEIN PO LIQD
11.0000 [oz_av] | Freq: Two times a day (BID) | ORAL | Status: DC
  Administered 2020-06-03 – 2020-06-07 (×7): 11 [oz_av] via ORAL
  Filled 2020-06-03 (×9): qty 330

## 2020-06-03 MED ORDER — INSULIN STARTER KIT- SYRINGES (ENGLISH)
1.0000 | Freq: Once | Status: AC
  Administered 2020-06-03: 1
  Filled 2020-06-03: qty 1

## 2020-06-03 MED ORDER — LOSARTAN POTASSIUM 25 MG PO TABS: 12.5000 mg | ORAL_TABLET | Freq: Every day | ORAL | 0 refills | Status: DC

## 2020-06-03 MED FILL — LANTUS 100 UNITS/ML VIAL: 100 | 28 days supply | Qty: 10 | Fill #0

## 2020-06-03 MED FILL — ATORVASTATIN CALCIUM 20 MG: 20 | 30 days supply | Qty: 60 | Fill #0

## 2020-06-03 MED FILL — AMOXICILLIN 500 MG CAPS: 500 | 8 days supply | Qty: 24 | Fill #0

## 2020-06-03 MED FILL — metFORMIN HCL 500 MG TABS: 500 | 30 days supply | Qty: 60 | Fill #0

## 2020-06-03 MED FILL — ULTICARE INS 0.3 ML 30GX1/2: 30G X 1/2" | 30 days supply | Qty: 30 | Fill #0

## 2020-06-03 MED FILL — oxyCODONE HCL 5 MG TABS: 5 | 3 days supply | Qty: 10 | Fill #0

## 2020-06-03 MED FILL — LOSARTAN POTASSIUM 25 MG TA: 25 | 30 days supply | Qty: 15 | Fill #0

## 2020-06-03 NOTE — Progress Notes (Signed)
   Covid-19 Vaccination Clinic  Name:  FENRIS CAUBLE    MRN: [id number] DOB: 1971-09-18  06/03/2020  Mr. Sheard was observed post Covid-19 immunization for 30 minutes without incident. He was provided with Vaccine Information Sheet and instruction to access the V-Safe system.   Mr. Hansson was instructed to call 911 with any severe reactions post vaccine: Marland Kitchen Difficulty breathing  . Swelling of face and throat  . A fast heartbeat  . A bad rash all over body  . Dizziness and weakness   Immunizations Administered    Name Date Dose VIS Date Route   Moderna COVID-19 Vaccine 06/03/2020  9:30 AM 0.5 mL 09/2019 Intramuscular   Manufacturer: Moderna   Lot: [id number]   NDC: 06269-485-46

## 2020-06-03 NOTE — TOC Initial Note (Addendum)
Transition of Care [facility]) - Initial/Assessment Note    Patient Details  Name: Sean Carter MRN: [id number] Date of Birth: [birth year]  Transition of Care [facility]) CM/SW Contact:    Joanne Chars, LCSW Phone Number: 06/03/2020, 1:55 PM  Clinical Narrative:    CSW met with pt to discuss discharge plan.  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  He does not have PCP but said his attending MD informed him that he can follow him as PCP once he discharges.  We discussed that he will have more options once insurance is active and he can call customer service to identify a provider.  Pt is interested in [facility] if that can be arranged.  Pt understands that only one agency provides charity HH at a time.  Discussed equipment recommendations and pt states he only wants a walker.      1400: Match completed for medicine.  PCP plqn--t has appt with [facility].  Walker delivered.  CSW contacted [facility] for charity [facility] and they report they cannot take pt as they are full for charity care.  Pt requires wound vac and HH RN and this is not in place, neither is a wound vac. HH order also PT/OT. CSW spoke to Dr Alba Cory about this issue and she will cancel discharge and speak to the surgeon regarding any options for the wound care.  CSW spoke to pt and he will get more specific information on when his health insurance is effective.   Expected Discharge Plan: [place] Barriers to Discharge: Inadequate or no insurance   Patient Goals and CMS Choice Patient states their goals for this hospitalization and ongoing recovery are:: discharge home CMS Medicare.gov Compare Post Acute Care list provided to::  (uninsured patient-will attempt charity care [facility])    Expected Discharge Plan and Services Expected Discharge Plan: [place]       Living arrangements for the past 2 months: Single  Family Home                 DME Arranged: Walker rolling DME Agency: [employer] Date DME Agency Contacted: 06/03/20 Time DME Agency Contacted: 85 Representative spoke with at DME Agency: Thedore Mins            Prior Living Arrangements/Services Living arrangements for the past 2 months: [place] with:: Significant Other Patient language and need for interpreter reviewed:: Yes Do you feel safe going back to the place where you live?: Yes      Need for Family Participation in Patient Care: No (Comment) Care giver support system in place?: Yes (comment)   Criminal Activity/Legal Involvement Pertinent to Current Situation/Hospitalization: No - Comment as needed  Activities of Daily Living Home Assistive Devices/Equipment: None ADL Screening (condition at time of admission) Patient's cognitive ability adequate to safely complete daily activities?: Yes Is the patient deaf or have difficulty hearing?: No Does the patient have difficulty seeing, even when wearing glasses/contacts?: No Does the patient have difficulty concentrating, remembering, or making decisions?: No Patient able to express need for assistance with ADLs?: Yes Does the patient have difficulty dressing or bathing?: No Independently performs ADLs?: Yes (appropriate for developmental age) Does the patient have difficulty walking or climbing stairs?: No Weakness of Legs: None Weakness of Arms/Hands: None  Permission Sought/Granted Permission sought to share information with : Customer service manager, Family Supports  Permission granted to share information with : Yes, Verbal Permission Granted  Share Information with NAME: Virgel Bouquet  Permission granted to share info w AGENCY: HH,DME  Permission granted to share info w Relationship: girlfriend     Emotional Assessment Appearance:: Appears stated age Attitude/Demeanor/Rapport: Engaged Affect (typically observed): Pleasant Orientation: :  Oriented to Self, Oriented to Place, Oriented to  Time, Oriented to Situation Alcohol / Substance Use: Not Applicable Psych Involvement: No (comment)  Admission diagnosis:  Necrotizing fasciitis ([place]) [M72.6] Gangrene of toe ([name]) [I96] Toe infection [L08.9] Sepsis with acute organ dysfunction without septic shock, due to unspecified organism, unspecified type ([place]) [A41.9, R65.20] Patient Active Problem List   Diagnosis Date Noted  . Bacteremia due to group B Streptococcus   . Necrotizing fasciitis ([place])   . Sepsis with acute organ dysfunction without septic shock ([place])   . Toe infection   . Status post surgery   . Gangrene of toe ([place]) 05/30/2020  . Hyponatremia 05/30/2020  . Hypokalemia 05/30/2020  . Hyperglycemia due to diabetes mellitus ([place]) 05/30/2020  . Gangrene of toe of left foot ([place])    PCP:  Patient, No Pcp Per Pharmacy:   [id number] [id number]: [phone]: [address] [address]Phone: [phone]: [phone]    Social Determinants of Health (SDOH) Interventions    Readmission Risk Interventions No flowsheet data found.

## 2020-06-03 NOTE — Progress Notes (Addendum)
Inpatient Diabetes Program Recommendations  AACE/ADA: New Consensus Statement on Inpatient Glycemic Control   Target Ranges:  Prepandial:   less than 140 mg/dL      Peak postprandial:   less than 180 mg/dL (1-2 hours)      Critically ill patients:  140 - 180 mg/dL  Results for RAYNER, ERMAN (MRN [id number]) as of 06/03/2020 07:56  Ref. Range 06/03/2020 02:18  Glucose Latest Ref Range: 70 - 99 mg/dL 468 (H)   Results for MARQUAVIOUS, NAZAR (MRN [id number]) as of 06/03/2020 07:56  Ref. Range 06/02/2020 08:16 06/02/2020 12:15 06/02/2020 16:36  Glucose-Capillary Latest Ref Range: 70 - 99 mg/dL 500 (H) 370 (H) 488 (H)   Results for JUANCARLOS, CRESCENZO (MRN [id number]) as of 06/03/2020 07:56  Ref. Range 05/31/2020 01:08  Hemoglobin A1C Latest Ref Range: 4.8 - 5.6 % 11.3 (H)   Review of Glycemic Control  Diabetes history: DM2 Outpatient Diabetes medications: None Current orders for Inpatient glycemic control: Lantus 15 units daily, Novolog 0-15 TID with meals  Inpatient Diabetes Program Recommendations:    Insulin: Noted Lantus increased form 10 to 15 units daily today. Please consider adding Novolog 6 units TID with meals and Novolog 0-5 units QHS for bedtime correction.  A1C: A1C 11.3% on 05/31/20 indicating an average glucose of 278 mg/dl over the past 2-3 months. Patient will need to be prescribed DM medications at time of discharge.  Addendum 06/03/20@14 :10-Spoke with patient about diabetes and home regimen for diabetes control. Patient reports that he does not have PCP and currently no insurance. Patient notes that he will be getting insurance in the near future (after 90 days with current employer). Patient states that he use to take Metformin for DM and had also taken Glipizide in the past as well.  Patient does not have a glucometer or checking glucose at home. Provided a glucometer and testing supplies. Informed patient that once he used those testing supplies he can get more supplies at [facility] for the  ReliOn Classic glucometer.  Discussed A1C results (11.3% on 05/31/20 ) and explained that current A1C indicates an average glucose of 278 mg/dl over the past 2-3 months. Discussed glucose and A1C goals. Discussed importance of checking CBGs and maintaining good CBG control to prevent long-term and short-term complications. Stressed to the patient the importance of improving glycemic control to prevent further complications from uncontrolled diabetes especially following surgery. Discussed impact of nutrition, exercise, stress, sickness, and medications on diabetes control.  Discussed carbohydrates, carbohydrate goals per day and meal, along with portion sizes. Patient states that RD has talked with him as well and he now has a better understanding of carbohydrate modified diet. Encouraged patient to check glucose 3-4 times per day (before meals and at bedtime) and to keep a log book of glucose readings and DM medication taken which patient will need to take to doctor appointments. Explained how the doctor can use the log book to continue to make adjustments with DM medications if needed. Patient will follow up with [facility]. Patient has all discharge medications at bedside and will be discharged on Lantus (vial) and Metformin for DM. Discussed Lantus and Metformin in detail and how they work for DM control.  Explained and demonstrated proper technique on how to draw up insulin with vial and syringe. Showed patient how to draw up air, inject air into vial, draw up insulin, and inject into practice dome. Patient demonstrated competency with insulin administration by vial and syringe. Reviewed hypoglycemia and  hyperglycemia along with treatment for both. Encouraged patient to be sure to follow up consistently with clinic and to reach out to clinic if glucose remains elevated at home. Patient verbalized understanding of information discussed and reports no further questions at this time related to  diabetes.  Thanks, Orlando Penner, RN, MSN, CDE Diabetes Coordinator Inpatient Diabetes Program [phone] (Team Pager from 8am to 5pm)

## 2020-06-03 NOTE — Progress Notes (Signed)
Initial Nutrition Assessment  DOCUMENTATION CODES:   Not applicable  INTERVENTION:  -Ensure Max po BID, each supplement provides 150 kcal and 30 grams of protein  -1 packet Juven BID, each packet provides 95 calories, 2.5 grams of protein (collagen), and 9.8 grams of carbohydrate (3 grams sugar); also contains 7 grams of L-arginine and L-glutamine, 300 mg vitamin C, 15 mg vitamin E, 1.2 mcg vitamin B-12, 9.5 mg zinc, 200 mg calcium, and 1.5 g  Calcium Beta-hydroxy-Beta-methylbutyrate to support wound healing  -Provided education on importance of adequate kcal/protein intake for wound healing  -Provided education on diabetic diet  NUTRITION DIAGNOSIS:   Increased nutrient needs related to wound healing as evidenced by estimated needs.    GOAL:   Patient will meet greater than or equal to 90% of their needs    MONITOR:   PO intake, Supplement acceptance, Skin, Weight trends, Labs, I & O's  REASON FOR ASSESSMENT:   Consult Assessment of nutrition requirement/status  ASSESSMENT:   Pt admitted with sepsis 2/2 gangrene first toe of L foot. PMH includes type 2 DM.   8/13 s/p amputation of L great toe with negative pressure wound VAC placement  Pt reports appetite is good now, but states it was poor for about 1 week PTA. 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 Provided education on a diabetic diet and discussed options to help stabilize his blood sugar based on his current eating patterns. Also discussed importance of adequate protein/calorie intake for wound healing.  No wt hx available for review.   PO Intake: 100% x 1 recorded meal  Labs: Na 128 (L), CBGs [phone] (Diabetes Coordinator following) Medications: Novolog, Lantus  NUTRITION - FOCUSED PHYSICAL EXAM:    Most Recent Value  Orbital Region No depletion  Upper Arm Region No depletion  Thoracic and Lumbar Region No depletion  Buccal Region  No depletion  Temple Region No depletion  Clavicle Bone Region No depletion  Clavicle and Acromion Bone Region No depletion  Scapular Bone Region No depletion  Dorsal Hand No depletion  Patellar Region No depletion  Anterior Thigh Region Mild depletion  Posterior Calf Region No depletion  Edema (RD Assessment) None  Hair Reviewed  Eyes Reviewed  Mouth Reviewed  Skin Reviewed  Nails Reviewed       Diet Order:   Diet Order            Diet Carb Modified Fluid consistency: Thin; Room service appropriate? Yes  Diet effective now                 EDUCATION NEEDS:   Education needs have been addressed  Skin:  Skin Assessment: Skin Integrity Issues: Skin Integrity Issues:: Wound VAC, Incisions Wound Vac: L foot s/p L great toe amputation Incisions: L foot  Last BM:  8/14  Height:   Ht Readings from Last 1 Encounters:  05/30/20 5\' 8"  (1.727 m)    Weight:   Wt Readings from Last 1 Encounters:  05/30/20 81.9 kg    BMI:  Body mass index is 27.45 kg/m.  Estimated Nutritional Needs:   Kcal:  07/30/20  Protein:  120-130 grams  Fluid:  >/= 2.2L/d    [phone], MS, RD, LDN RD pager number and weekend/on-call pager number located in Amion.

## 2020-06-03 NOTE — Progress Notes (Deleted)
Pts wife Virgel Bouquet, took home meds that were given from  Pharmacy and Insulin Kit.

## 2020-06-03 NOTE — Telephone Encounter (Signed)
Called pt [name] for patient to call and make appt per Dr Darrick Penna pt needs to F/U with Dr Chestine Spore in 2/3 weeks for a Wound check appt.

## 2020-06-03 NOTE — Progress Notes (Addendum)
Family Medicine Teaching Service Daily Progress Note Intern Pager: [phone]  Patient name: Sean Carter Medical record number: [id number] Date of birth: 07-23-1971 Age: 49 y.o. Gender: male  Primary Care Provider: Patient, No Pcp Per Consultants: Ortho, vascular surgery Code Status: Full  Pt Overview and Major Events to Date:  Admitted 8/12  Assessment and Plan: Sean Carter a 48 y.o.malepresenting with 2 weeks of progressively worsening left great toe pain, now with color changes,patientalso experiencing flulike symptoms. PMH is significant fortype 2 diabetes and seasonal allergies.  GBS Bacteremia, improving   Gas gangrene ofleftfoot,first digit s/p left great toe amputation, resolved POD #3. WBC  17.9 today, increasing from 17.8 yesterday however no fevers overnight and patient is clinically improving. narrows antibiotics to Penicillin yesterday to cover for Group B strep.  S/p IV cefepime, vancomycin, Flagyl (8/12-8/13).  Wound VAC over left toe draining serosanguinous fluid. Tender on palpation. 2+ pitting edema of LE. No RE edema.  Warm/ well perfused.  -Ortho consulted, appreciate recommendations. LW vascular US yesterday showed right and left ABI wnl. -Vascular consulted, appreciate recommendations -Stop IV PCN G (8/13-8/16), starting Amoxicillin 500 TID for total of 10 day tx -Daily CBC, BMP -Vitals per floor routine -PT/OT eval and treat--recommend HHPT/OT, rolling walker, wheelchair with cushion, 3in1, heel-walker boot -COVID-19 Moderna Vaccine  T2DM: chronic, uncontrolled CBG overnight 319-345. A1c 11.3 indicating poor control. Pt is not on any diabetic medications at home.  -Carb modified diet  -Daily CBG's -Moderate sliding scale insulin -On Lantus 15U daily -We will plan to discharge home with diabetes regimen and close follow-up outpatient -Diabetes coordinator while inpatient--may need basal insulin, will reach back out today for further recs--consider  Metformin, SGLT2, Lantus?.  Protein Calorie Malnutrition Albumin 2.0, Total protein 5.2 likely secondary to poor nutrition. Pending nutrition consult  -CMP -Nutrition consult  Hyperlipidemia: Chronic, stable Lipid panel 8/12: TG 247, HDL <10, VLDL 49, LDL not calculated, total cholesterol 125. ASCVD risk 24.2%. - Continue Atorvastatin 20mg    - Counseled on dietary modifications  HTN: Stable BP 155/82. Not on home anti-hypertensives. Losartan 12.5mg  started 8/14. -Continue to monitor BPs -Continue Losartan 12.5mg    Normocytic Anemia: Stable Hemoglobin 11.5>10.2>9.5>9>9 likely in setting of recent surgery. Hb threshold is 7. No known hx of CAD.  - Continue to monitor - Daily CBC  Hyponatremia: Acutely worse Sodium 131>130>127>129>128.   -Daily BMP - Continue to monitor, better BG control  Hypokalemia: Acute, resolved K 3.6, 3.6 > 4.1 (8/16) -Daily BMP -Replete as necessary  FEN/GI: Carb modified PPx: Heparin subcutaneous  Disposition: Transition to PO abx, plan to d/c with home PT/OT. Potential discharge tomorrow.  Subjective:  Patient reports that he is doing okay today, reports numbness and tingling in his left leg but reports no true pain. No other complaints.    Objective: Temp:  [98 F (36.7 C)-100.1 F (37.8 C)] 98 F (36.7 C) (08/16 0629) Pulse Rate:  [84-94] 84 (08/16 0629) Resp:  [18-22] 19 (08/16 0629) BP: (128-177)/(68-97) 155/82 (08/16 0629) SpO2:  [98 %-100 %] 98 % (08/16 0629)    Physical Exam: General: Alert, cooperative, no acute distress, sitting upright in bed. Cardio: Normal S1 and S2, RRR  Pulm: CTAB, Normal respiratory effort Abdomen: Bowel sounds normal. Abdomen soft and non-tender.  Extremities: Wound VAC over left toe draining serosanguinous fluid. Tender on palpation. 2+ pitting edema of LE. No RE edema.  Warm/ well perfused.  Neuro: Cranial nerves grossly intact    Laboratory: Recent Labs  Lab 06/01/20 0231  06/02/20 0125  06/03/20 0218  WBC 13.9* 17.8* 17.9*  HGB 9.5* 9.0* 9.0*  HCT 27.9* 27.3* 27.0*  PLT 159 189 265   Recent Labs  Lab 06/01/20 0231 06/02/20 0125 06/03/20 0218  NA 127* 129* 128*  K 3.6 3.6 4.1  CL 93* 93* 90*  CO2 24 28 27   BUN 23* 23* 17  CREATININE 0.86 0.94 0.82  CALCIUM 7.6* 7.7* 7.6*  PROT 5.0* 5.1* 5.2*  BILITOT 0.7 0.4 0.7  ALKPHOS 108 124 126  ALT 45* 47* 39  AST 53* 49* 31  GLUCOSE 324* 318* 319*    Imaging/Diagnostic Tests: VAS ABI WITH/WO TBI  Result Date: 06/01/2020 LOWER EXTREMITY DOPPLER STUDY Indications: Ulceration. Other Factors: Status post left great toe amputation 05/31/20.  Comparison Study: No prior study on file for comparison Performing Technologist: 06/02/20 RVS  Examination Guidelines: A complete evaluation includes at minimum, Doppler waveform signals and systolic blood pressure reading at the level of bilateral brachial, anterior tibial, and posterior tibial arteries, when vessel segments are accessible. Bilateral testing is considered an integral part of a complete examination. Photoelectric Plethysmograph (PPG) waveforms and toe systolic pressure readings are included as required and additional duplex testing as needed. Limited examinations for reoccurring indications may be performed as noted.  ABI Findings: +---------+------------------+-----+---------+--------+  Right     Rt Pressure (mmHg) Index Waveform  Comment   +---------+------------------+-----+---------+--------+  Brachial  134                      triphasic           +---------+------------------+-----+---------+--------+  PTA       156                1.16  triphasic           +---------+------------------+-----+---------+--------+  DP        172                1.28  triphasic           +---------+------------------+-----+---------+--------+  Great Toe 122                0.91                      +---------+------------------+-----+---------+--------+  +---------+------------------+-----+---------+----------+  Left      Lt Pressure (mmHg) Index Waveform  Comment     +---------+------------------+-----+---------+----------+  Brachial  129                      triphasic             +---------+------------------+-----+---------+----------+  PTA       136                1.01  triphasic             +---------+------------------+-----+---------+----------+  DP        171                1.28  triphasic             +---------+------------------+-----+---------+----------+  Great Toe                                    amputation  +---------+------------------+-----+---------+----------+  2nd toe   106                0.79                        +---------+------------------+-----+---------+----------+ +-------+-----------+-----------+------------+------------+  ABI/TBI Today's ABI Today's TBI Previous ABI Previous TBI  +-------+-----------+-----------+------------+------------+  Right   1.28        0.91                                   +-------+-----------+-----------+------------+------------+  Left    1.28        0.79                                   +-------+-----------+-----------+------------+------------+  Summary: Right: Resting right ankle-brachial index is within normal range. No evidence of significant right lower extremity arterial disease. The right toe-brachial index is normal. Left: Resting left ankle-brachial index is within normal range. No evidence of significant left lower extremity arterial disease. The left toe-brachial index is normal.  *See table(s) above for measurements and observations.  Electronically signed by Fabienne Bruns MD on 06/01/2020 at 12:03:34 PM.    Final     Evelena Leyden, DO 06/03/2020, 7:41 AM PGY-1, [facility] Family Medicine FPTS Intern pager: [phone], text pages welcome

## 2020-06-04 ENCOUNTER — Inpatient Hospital Stay (HOSPITAL_COMMUNITY): Payer: Self-pay

## 2020-06-04 DIAGNOSIS — R239 Unspecified skin changes: Secondary | ICD-10-CM

## 2020-06-04 LAB — COMPREHENSIVE METABOLIC PANEL
ALT: 45 U/L — ABNORMAL HIGH (ref 0–44)
AST: 40 U/L (ref 15–41)
Albumin: 1.8 g/dL — ABNORMAL LOW (ref 3.5–5.0)
Alkaline Phosphatase: 159 U/L — ABNORMAL HIGH (ref 38–126)
Anion gap: 9 (ref 5–15)
BUN: 15 mg/dL (ref 6–20)
CO2: 29 mmol/L (ref 22–32)
Calcium: 7.8 mg/dL — ABNORMAL LOW (ref 8.9–10.3)
Chloride: 93 mmol/L — ABNORMAL LOW (ref 98–111)
Creatinine, Ser: 0.83 mg/dL (ref 0.61–1.24)
GFR calc Af Amer: >60 mL/min (ref >60)
GFR calc non Af Amer: >60 mL/min (ref >60)
Glucose, Bld: 324 mg/dL — ABNORMAL HIGH (ref 70–99)
Potassium: 3.9 mmol/L (ref 3.5–5.1)
Sodium: 131 mmol/L — ABNORMAL LOW (ref 135–145)
Total Bilirubin: 0.5 mg/dL (ref 0.3–1.2)
Total Protein: 5.4 g/dL — ABNORMAL LOW (ref 6.5–8.1)

## 2020-06-04 LAB — CBC
HCT: 24.5 % — ABNORMAL LOW (ref 39.0–52.0)
Hemoglobin: 8.1 g/dL — ABNORMAL LOW (ref 13.0–17.0)
MCH: 28.5 pg (ref 26.0–34.0)
MCHC: 33.1 g/dL (ref 30.0–36.0)
MCV: 86.3 fL (ref 80.0–100.0)
Platelets: 348 10*3/uL (ref 150–400)
RBC: 2.84 MIL/uL — ABNORMAL LOW (ref 4.22–5.81)
RDW: 12.7 % (ref 11.5–15.5)
WBC: 18.3 10*3/uL — ABNORMAL HIGH (ref 4.0–10.5)
nRBC: 0 % (ref 0.0–0.2)

## 2020-06-04 LAB — GLUCOSE, CAPILLARY
Glucose-Capillary: 281 mg/dL — ABNORMAL HIGH (ref 70–99)
Glucose-Capillary: 300 mg/dL — ABNORMAL HIGH (ref 70–99)
Glucose-Capillary: 307 mg/dL — ABNORMAL HIGH (ref 70–99)
Glucose-Capillary: 272 mg/dL — ABNORMAL HIGH (ref 70–99)

## 2020-06-04 MED ORDER — ATORVASTATIN CALCIUM 40 MG PO TABS
40.0000 mg | ORAL_TABLET | Freq: Every day | ORAL | Status: DC
  Administered 2020-06-04 – 2020-06-07 (×4): 40 mg via ORAL
  Filled 2020-06-04 (×4): qty 1

## 2020-06-04 MED ORDER — INSULIN GLARGINE 100 UNIT/ML ~~LOC~~ SOLN
25.0000 [IU] | Freq: Every day | SUBCUTANEOUS | Status: DC
  Administered 2020-06-05: 25 [IU] via SUBCUTANEOUS
  Filled 2020-06-04: qty 0.25

## 2020-06-04 NOTE — Progress Notes (Signed)
Physical Therapy Treatment Patient Details Name: Sean Carter MRN: [id number] DOB: 1971/07/30 Today's Date: 06/04/2020    History of Present Illness Sean Carter is a 49 y.o. male presenting with 2 weeks of progressively worsening left great toe pain, now with color changes, patient also experiencing flulike symptoms. PMH is significant for type 2 diabetes and seasonal allergies.Pt underwent L ray amputation    PT Comments    Patient received in bed, pleasant but fatigued from just having gotten to the bathroom. Able to perform bed mobility with S, but did need fairly consistent MinA to maintain balance and safety with deficits as noted below. Attempted step navigation however he was unable to hop up the step due to pain and weakness. Has very poor safety awareness with RW and often tries to wriggle/shuffle his intact R foot instead of hopping, and often ends up pushing RW too far away from him in the process. Showed video and discussed WC navigation of single step with assist (actual WC unavailable, will try to find one for tomorrow's session). Left in bed with all needs met, bed alarm active. May benefit from CIR rather than return home due to ongoing significant difficulties with mobility.    Follow Up Recommendations  CIR;Supervision for mobility/OOB     Equipment Recommendations  Rolling walker with 5" wheels;Wheelchair (measurements PT);3in1 (PT) (20 inch wide WC, elevating leg rests)    Recommendations for Other Services       Precautions / Restrictions Precautions Precautions: Fall;Other (comment) Precaution Comments: wound vac Restrictions Weight Bearing Restrictions: No Other Position/Activity Restrictions: NWB L LE    Mobility  Bed Mobility   Bed Mobility: Supine to Sit;Sit to Supine     Supine to sit: Supervision Sit to supine: Supervision   General bed mobility comments: S for safety, increased time and effort  Transfers Overall transfer level: Needs  assistance Equipment used: Rolling walker (2 wheeled) Transfers: Sit to/from Stand Sit to Stand: Min assist         General transfer comment: MinA in general to maintain balance, cues for sequencing and hand placement  Ambulation/Gait Ambulation/Gait assistance: Min assist Gait Distance (Feet): 5 Feet Assistive device: Rolling walker (2 wheeled) Gait Pattern/deviations: Shuffle;Decreased dorsiflexion - right;Trunk flexed Gait velocity: slow   General Gait Details: shuffle to pattern with RW, had a very difficult time with hop to pattern and often opted to try to slide foot along the floor, unsafe with RW in general   Stairs Stairs: Yes       General stair comments: attempted but unable to clear edge of step   Wheelchair Mobility    Modified Rankin (Stroke Patients Only)       Balance Overall balance assessment: Needs assistance Sitting-balance support: No upper extremity supported;Feet supported Sitting balance-Leahy Scale: Fair     Standing balance support: Bilateral upper extremity supported;During functional activity Standing balance-Leahy Scale: Poor Standing balance comment: reliant on B UE support on RW or Min A from therapist for transitional movements                            Cognition Arousal/Alertness: Awake/alert Behavior During Therapy: WFL for tasks assessed/performed Overall Cognitive Status: Within Functional Limits for tasks assessed                                 General Comments: very pleasant and cooperative  Exercises      General Comments        Pertinent Vitals/Pain Pain Assessment: Faces Faces Pain Scale: Hurts a little bit Pain Location: L LE Pain Descriptors / Indicators: Aching;Sore Pain Intervention(s): Limited activity within patient's tolerance;Monitored during session    Home Living                      Prior Function            PT Goals (current goals can now be found in  the care plan section) Acute Rehab PT Goals Patient Stated Goal: be able to manage on my own PT Goal Formulation: With patient Time For Goal Achievement: 06/15/20 Potential to Achieve Goals: Good Progress towards PT goals: Progressing toward goals    Frequency    Min 3X/week      PT Plan Discharge plan needs to be updated    Co-evaluation              AM-PAC PT "6 Clicks" Mobility   Outcome Measure  Help needed turning from your back to your side while in a flat bed without using bedrails?: None Help needed moving from lying on your back to sitting on the side of a flat bed without using bedrails?: None Help needed moving to and from a bed to a chair (including a wheelchair)?: A Little Help needed standing up from a chair using your arms (e.g., wheelchair or bedside chair)?: A Little Help needed to walk in hospital room?: A Little Help needed climbing 3-5 steps with a railing? : A Little 6 Click Score: 20    End of Session Equipment Utilized During Treatment: Gait belt Activity Tolerance: Patient tolerated treatment well Patient left: in bed;with call bell/phone within reach;with bed alarm set   PT Visit Diagnosis: Unsteadiness on feet (R26.81);Other abnormalities of gait and mobility (R26.89);Muscle weakness (generalized) (M62.81);Difficulty in walking, not elsewhere classified (R26.2);Pain Pain - Right/Left: Left Pain - part of body: Ankle and joints of foot     Time: 0962-8366 PT Time Calculation (min) (ACUTE ONLY): 28 min  Charges:  $Gait Training: 8-22 mins $Self Care/Home Management: 8-22                     Windell Norfolk, DPT, PN1   Supplemental Physical Therapist [place]    Pager [phone] Acute Rehab Office [phone]

## 2020-06-04 NOTE — Progress Notes (Signed)
Inpatient Diabetes Program Recommendations  AACE/ADA: New Consensus Statement on Inpatient Glycemic Control   Target Ranges:  Prepandial:   less than 140 mg/dL      Peak postprandial:   less than 180 mg/dL (1-2 hours)      Critically ill patients:  140 - 180 mg/dL  Results for NAT, LOWENTHAL (MRN [id number]) as of 06/04/2020 09:17  Ref. Range 06/03/2020 08:07 06/03/2020 11:39 06/03/2020 15:48 06/04/2020 06:54  Glucose-Capillary Latest Ref Range: 70 - 99 mg/dL 196 (H) 222 (H) 979 (H) 281 (H)    Review of Glycemic Control  Diabetes history: DM2 Outpatient Diabetes medications: None Current orders for Inpatient glycemic control: Lantus 15 units daily, Novolog 0-15 units TID with meals  Inpatient Diabetes Program Recommendations:    Insulin: Please consider increasing Lantus to 20 units daily (also order Lantus 5 units x1 now for total of 20 units today), add Novolog 0-5 units QHS for bedtime correction, and Novolog 5 units TID with meals for meal coverage if patient eats at least 50% of meals.  Thanks, Orlando Penner, RN, MSN, CDE Diabetes Coordinator Inpatient Diabetes Program [phone] (Team Pager from 8am to 5pm)

## 2020-06-04 NOTE — TOC Progression Note (Signed)
Transition of Care [facility]) - Progression Note    Patient Details  Name: Sean Carter MRN: [id number] Date of Birth: [birth year]  [facility] CM/SW Contact  Lorri Frederick, LCSW Phone Number: 06/04/2020, 3:48 PM  Clinical Narrative:  CSW spoke with pt about any more information regarding effective date for his health insurance.  Pt has emailed his company, but has not information yet.  [facility] is the charity care HH for this week and reports they do not have any available slots for charity [facility].  PT messaged asking for CIR review for potential admit, but CIR reports pt does not meet criteria.  CSW contacted [facility]--they do take self pay patients, but their first available appt is mid-Sept.  CSW contacted [facility].  They also take self pay but their first available appt is Sept 1.  CSW contacted Wandra Mannan regarding LOG and was told LOG is possibility, 3x week, $165.  CSW contacted Blossom Hoops at [facility] regarding wound vac.  She will send charity application for pt to fill out and he can be approved based on income. No charge if under federal poverty level, $125/day if above federal poverty level with 14 days in advance.       Expected Discharge Plan: Home w Home Health Services Barriers to Discharge: Inadequate or no insurance  Expected Discharge Plan and Services Expected Discharge Plan: Home w Home Health Services       Living arrangements for the past 2 months: Single Family Home                 DME Arranged: Walker rolling DME Agency: [employer] Date DME Agency Contacted: 06/03/20 Time DME Agency Contacted: 1330 Representative spoke with at DME Agency: Ian Malkin             Social Determinants of Health (SDOH) Interventions    Readmission Risk Interventions No flowsheet data found.

## 2020-06-04 NOTE — Progress Notes (Signed)
Inpatient Rehab Admissions Coordinator:   Met with patient at bedside to discuss potential CIR admit. Cost was discussed with Pt. As well as his medical and rehab needs. Pt. Does not currently demonstrate medical necessity for CIR admit. Pt. States understanding and that he would like to go home with home health if that can be worked out. CIR will sign off at this time.  Clemens Catholic, [place], [place] Admissions Coordinator  [phone] ([place]) [phone] (office)

## 2020-06-04 NOTE — Progress Notes (Signed)
Family Medicine Teaching Service Daily Progress Note Intern Pager: [phone]  Patient name: Sean Carter Medical record number: [id number] Date of birth: 03-26-1971 Age: 49 y.o. Gender: male  Primary Care Provider: Patient, No Pcp Per Consultants: Ortho, vascular surgery Code Status: Full  Pt Overview and Major Events to Date:  Admitted 8/12  Assessment and Plan: Sean Carter a 48 y.o.malepresenting with 2 weeks of progressively worsening left great toe pain, now with color changes,patientalso experiencing flulike symptoms. PMH is significant fortype 2 diabetes and seasonal allergies.  GBS Bacteremia, improving  Gas gangrene ofleftfoot,first digit s/p left great toe amputation, resolved POD #4. WBC  18.3 today, increasing from 17.9 yesterday with a fever of 102.4F overnight. The patient did receive his COVID vaccination yesterday. Antibiotics narrowed to Penicillin to cover for Group B strep.  S/p IV cefepime, vancomycin, Flagyl (8/12-8/13).  Wound VAC over left toe draining serosanguinous fluid. Tender on palpation. 2+ pitting edema of LE. No RE edema.  Warm/ well perfused.  -Ortho consulted, appreciate recommendations. LW vascular US yesterday showed right and left ABI wnl. -Vascular consulted, appreciate recommendations -Stop IV PCN G (8/13-8/16), started Amoxicillin 500 TID (8/16) for total of 10 day tx -Daily CBC, BMP -Vitals per floor routine -PT/OT eval and treat--recommend HHPT/OT, rolling walker, wheelchair with cushion, 3in1, heel-walker boot -COVID-19 Moderna Vaccine--completed 8/16 -New fever 8/17: blood and urine cultures as well as CXR ordered  T2DM: chronic, uncontrolled CBG overnight 246-348. A1c 11.3 indicating poor control. Pt is not on any diabetic medications at home.  -Carb modified diet  -Daily CBG's -Moderate sliding scale insulin -On Lantus 15U daily -We will plan to discharge home with diabetes regimen and close follow-up outpatient -Discharge  diabetic meds: Metformin 500mg  BID, Lantus 20U daily  Protein Calorie Malnutrition Albumin 2.0, Total protein 5.2 likely secondary to poor nutrition.   -CMP -Nutrition consult  Hyperlipidemia: Chronic, stable Lipid panel 8/12: TG 247, HDL <10, VLDL 49, LDL not calculated, total cholesterol 125. ASCVD risk 24.2%. - Increased Atorvastatin to 40mg  daily - Counseled on dietary modifications  HTN: Stable BP 161/92, pt states he was upset this morning. Not on home anti-hypertensives. Losartan 12.5mg  started 8/14. -Continue to monitor BPs -Continue Losartan 12.5mg    Normocytic Anemia: Stable Hemoglobin 11.5>10.2>9.5>9>9>8.1 likely in setting of recent surgery. Hb threshold is 7. No known hx of CAD.  - Continue to monitor - Daily CBC  Hyponatremia: improving Sodium 131>130>127>129>128>131.   -Daily BMP - Continue to monitor, better BG control  Hypokalemia: Acute, resolved K 3.6, 3.6 > 4.1>3.9 -Daily BMP -Replete as necessary  FEN/GI: Carb modified PPx: Heparin subcutaneous  Disposition: Plan to d/c with home PT/OT and wound care. Potential discharge tomorrow.  Subjective:  Patient reports that he is concerned about the fever that he had last night. He is otherwise doing fine because he just had his medications that made him "a little loopy" so had no pain complaints. Still reports numbness and tingling in his left lower leg and states he is now "feels more aware" of his foot.    Objective: Temp:  [98.7 F (37.1 C)-102.1 F (38.9 C)] 98.7 F (37.1 C) (08/17 0453) Pulse Rate:  [88-97] 97 (08/17 0453) Resp:  [18] 18 (08/17 0453) BP: (138-161)/(77-92) 161/92 (08/17 0453) SpO2:  [97 %-99 %] 97 % (08/17 0453)    Physical Exam: General: Alert, cooperative, no acute distress, sitting upright in bed. Cardio: Normal S1 and S2, RRR  Pulm: CTAB, Normal respiratory effort Abdomen: Bowel sounds normal. Abdomen soft  and non-tender.  Extremities: Wound VAC over left toe draining  serosanguinous fluid. Tender on palpation. 2+ pitting edema of LE with left calf tenderness to palpation. No RE edema.  Warm/ well perfused.  Neuro: Cranial nerves grossly intact    Laboratory: Recent Labs  Lab 06/02/20 0125 06/03/20 0218 06/04/20 0056  WBC 17.8* 17.9* 18.3*  HGB 9.0* 9.0* 8.1*  HCT 27.3* 27.0* 24.5*  PLT 189 265 348   Recent Labs  Lab 06/02/20 0125 06/03/20 0218 06/04/20 0056  NA 129* 128* 131*  K 3.6 4.1 3.9  CL 93* 90* 93*  CO2 28 27 29   BUN 23* 17 15  CREATININE 0.94 0.82 0.83  CALCIUM 7.7* 7.6* 7.8*  PROT 5.1* 5.2* 5.4*  BILITOT 0.4 0.7 0.5  ALKPHOS 124 126 159*  ALT 47* 39 45*  AST 49* 31 40  GLUCOSE 318* 319* 324*    Imaging/Diagnostic Tests: DG Chest 2 View  Result Date: 06/04/2020 CLINICAL DATA:  Fever. EXAM: CHEST - 2 VIEW COMPARISON:  05/30/2020. FINDINGS: Mediastinum and hilar structures normal. Mild cardiomegaly and pulmonary venous congestion. Low lung volumes. Very mild interstitial prominence cannot be completely excluded. No pleural effusion or pneumothorax. IMPRESSION: 1.  Mild cardiomegaly and pulmonary venous congestion. 2. Low lung volumes. Very mild interstitial prominence cannot be excluded. Chest is stable from prior exam. Electronically Signed   By: 07/30/2020  Register   On: 06/04/2020 08:47    06/06/2020, DO 06/04/2020, 8:54 AM PGY-1, [facility] Family Medicine FPTS Intern pager: [phone], text pages welcome

## 2020-06-04 NOTE — Progress Notes (Signed)
Occupational Therapy Treatment Patient Details Name: MASSON NALEPA MRN: [id number] DOB: 11-01-1970 Today's Date: 06/04/2020    History of present illness THURSTON BRENDLINGER is a 49 y.o. male presenting with 2 weeks of progressively worsening left great toe pain, now with color changes, patient also experiencing flulike symptoms. PMH is significant for type 2 diabetes and seasonal allergies.Pt underwent L ray amputation   OT comments  Pt progressing with OT goals but continues to be limited by decreased strength, dynamic standing balance, and endurance. Collaborated with pt again on home setup and DME needs. Simulated distance and transfer to toilet based on home setup with pt endorsing this task was taxing. Pt with improved stability compared to evaluation, but requires hands on assist to ensure safety and cues for sequencing tasks. Attempted unsupported standing at sink, but pt unable to complete. Strongly encouraged pt to reconsider obtaining wheelchair and BSC to maximize safety at home with pt agreeable with all education.    Follow Up Recommendations  Home health OT;Supervision/Assistance - 24 hour    Equipment Recommendations  3 in 1 bedside commode;Wheelchair (measurements OT);Wheelchair cushion (measurements OT);Other (comment) (RW)    Recommendations for Other Services      Precautions / Restrictions Precautions Precautions: Fall;Other (comment) Precaution Comments: wound vac Restrictions Weight Bearing Restrictions: Yes Other Position/Activity Restrictions: NWB L LE       Mobility Bed Mobility Overal bed mobility: Needs Assistance Bed Mobility: Supine to Sit;Sit to Supine     Supine to sit: Supervision Sit to supine: Supervision   General bed mobility comments: S for safety, increased time and effort  Transfers Overall transfer level: Needs assistance Equipment used: Rolling walker (2 wheeled) Transfers: Sit to/from Stand Sit to Stand: Min guard         General  transfer comment: min guard with increased time/effort, cues for sequencing    Balance Overall balance assessment: Needs assistance Sitting-balance support: No upper extremity supported;Feet supported Sitting balance-Leahy Scale: Fair     Standing balance support: Single extremity supported;During functional activity Standing balance-Leahy Scale: Poor Standing balance comment: attempting one UE and no UE support standing at sink, but unable to maintain well                           ADL either performed or assessed with clinical judgement   ADL Overall ADL's : Needs assistance/impaired     Grooming: Wash/dry face;Standing;Min guard Grooming Details (indicate cue type and reason): min guard for dynamic tasks at sink, able to stand for 3-5 minutes with min guard                 Toilet Transfer: Min guard;Ambulation;BSC;RW;Cueing for safety;Cueing for sequencing Toilet Transfer Details (indicate cue type and reason): min guard with improved stability, no overt LOB but still unsteady           General ADL Comments: Pt with continued deficits in dynamic standing balance, strength, and safety awareness with use of RW.      Vision   Vision Assessment?: No apparent visual deficits   Perception     Praxis      Cognition Arousal/Alertness: Awake/alert Behavior During Therapy: WFL for tasks assessed/performed Overall Cognitive Status: Within Functional Limits for tasks assessed                                 General Comments: very pleasant and  cooperative        Exercises     Shoulder Instructions       General Comments Extended time spent educating pt on DME needs. After pt reporting difficulty ambulating to sink and BSC simulation - pt endorsed that this was difficult and will reconsider     Pertinent Vitals/ Pain       Pain Assessment: Faces Faces Pain Scale: Hurts a little bit Pain Location: L LE Pain Descriptors / Indicators:  Aching;Sore Pain Intervention(s): Monitored during session  Home Living                                          Prior Functioning/Environment              Frequency  Min 2X/week        Progress Toward Goals  OT Goals(current goals can now be found in the care plan section)  Progress towards OT goals: Progressing toward goals  Acute Rehab OT Goals Patient Stated Goal: be able to manage on my own OT Goal Formulation: With patient/family Time For Goal Achievement: 06/15/20 Potential to Achieve Goals: Good ADL Goals Pt Will Perform Grooming: with supervision;standing Pt Will Perform Lower Body Bathing: with supervision;sit to/from stand Pt Will Perform Lower Body Dressing: with supervision;sit to/from stand Pt Will Transfer to Toilet: with min guard assist;ambulating;bedside commode Pt Will Perform Toileting - Clothing Manipulation and hygiene: with supervision;sit to/from stand;sitting/lateral leans Pt Will Perform Tub/Shower Transfer: with min guard assist;ambulating;tub bench;rolling walker  Plan Discharge plan remains appropriate    Co-evaluation                 AM-PAC OT "6 Clicks" Daily Activity     Outcome Measure   Help from another person eating meals?: None Help from another person taking care of personal grooming?: A Little Help from another person toileting, which includes using toliet, bedpan, or urinal?: A Little Help from another person bathing (including washing, rinsing, drying)?: A Little Help from another person to put on and taking off regular upper body clothing?: A Little Help from another person to put on and taking off regular lower body clothing?: A Little 6 Click Score: 19    End of Session Equipment Utilized During Treatment: Gait belt;Rolling walker  OT Visit Diagnosis: Unsteadiness on feet (R26.81);Other abnormalities of gait and mobility (R26.89);Muscle weakness (generalized) (M62.81);Pain Pain - Right/Left:  Left Pain - part of body: Ankle and joints of foot   Activity Tolerance Patient tolerated treatment well   Patient Left in bed;with call bell/phone within reach;with bed alarm set   Nurse Communication Mobility status;Other (comment) (red, firm place on L LE)        Time: 1340-1418 OT Time Calculation (min): 38 min  Charges: OT General Charges $OT Visit: 1 Visit OT Treatments $Self Care/Home Management : 23-37 mins $Therapeutic Activity: 8-22 mins  Lorre Munroe, OTR/L   Lorre Munroe 06/04/2020, 2:34 PM

## 2020-06-05 ENCOUNTER — Inpatient Hospital Stay (HOSPITAL_COMMUNITY): Payer: Self-pay

## 2020-06-05 DIAGNOSIS — S98112A Complete traumatic amputation of left great toe, initial encounter: Secondary | ICD-10-CM

## 2020-06-05 DIAGNOSIS — R239 Unspecified skin changes: Secondary | ICD-10-CM

## 2020-06-05 DIAGNOSIS — M79609 Pain in unspecified limb: Secondary | ICD-10-CM

## 2020-06-05 DIAGNOSIS — M7989 Other specified soft tissue disorders: Secondary | ICD-10-CM

## 2020-06-05 DIAGNOSIS — Z89429 Acquired absence of other toe(s), unspecified side: Secondary | ICD-10-CM

## 2020-06-05 DIAGNOSIS — L538 Other specified erythematous conditions: Secondary | ICD-10-CM

## 2020-06-05 LAB — URINE CULTURE: Culture: NO GROWTH

## 2020-06-05 LAB — GLUCOSE, CAPILLARY
Glucose-Capillary: 291 mg/dL — ABNORMAL HIGH (ref 70–99)
Glucose-Capillary: 355 mg/dL — ABNORMAL HIGH (ref 70–99)
Glucose-Capillary: 231 mg/dL — ABNORMAL HIGH (ref 70–99)
Glucose-Capillary: 212 mg/dL — ABNORMAL HIGH (ref 70–99)

## 2020-06-05 LAB — CBC
HCT: 26.6 % — ABNORMAL LOW (ref 39.0–52.0)
Hemoglobin: 8.5 g/dL — ABNORMAL LOW (ref 13.0–17.0)
MCH: 28 pg (ref 26.0–34.0)
MCHC: 32 g/dL (ref 30.0–36.0)
MCV: 87.5 fL (ref 80.0–100.0)
Platelets: 481 10*3/uL — ABNORMAL HIGH (ref 150–400)
RBC: 3.04 MIL/uL — ABNORMAL LOW (ref 4.22–5.81)
RDW: 12.8 % (ref 11.5–15.5)
WBC: 15.5 10*3/uL — ABNORMAL HIGH (ref 4.0–10.5)
nRBC: 0 % (ref 0.0–0.2)

## 2020-06-05 MED ORDER — VANCOMYCIN HCL IN DEXTROSE 1-5 GM/200ML-% IV SOLN
1000.0000 mg | Freq: Three times a day (TID) | INTRAVENOUS | Status: DC
  Administered 2020-06-05 – 2020-06-07 (×5): 1000 mg via INTRAVENOUS
  Filled 2020-06-05 (×5): qty 200

## 2020-06-05 MED ORDER — INSULIN GLARGINE 100 UNIT/ML ~~LOC~~ SOLN
35.0000 [IU] | Freq: Every day | SUBCUTANEOUS | Status: DC
  Administered 2020-06-06 – 2020-06-07 (×2): 35 [IU] via SUBCUTANEOUS
  Filled 2020-06-05 (×2): qty 0.35

## 2020-06-05 MED ORDER — VANCOMYCIN HCL 2000 MG/400ML IV SOLN
2000.0000 mg | Freq: Once | INTRAVENOUS | Status: AC
  Administered 2020-06-05: 2000 mg via INTRAVENOUS
  Filled 2020-06-05: qty 400

## 2020-06-05 MED ORDER — PIPERACILLIN-TAZOBACTAM 3.375 G IVPB 30 MIN
3.3750 g | Freq: Once | INTRAVENOUS | Status: AC
  Administered 2020-06-05: 3.375 g via INTRAVENOUS
  Filled 2020-06-05 (×2): qty 50

## 2020-06-05 MED ORDER — PIPERACILLIN-TAZOBACTAM 3.375 G IVPB
3.3750 g | Freq: Three times a day (TID) | INTRAVENOUS | Status: DC
  Administered 2020-06-05 – 2020-06-06 (×3): 3.375 g via INTRAVENOUS
  Filled 2020-06-05 (×3): qty 50

## 2020-06-05 NOTE — Progress Notes (Signed)
Pharmacy Antibiotic Note  Sean Carter is a 49 y.o. male admitted on 05/30/2020 for sepsis secondary to gangrene of left foot. Was febrile (8/16-8/17) and now has focal area of discoloration as of (8/18) Recently recieved COVID-19 vaccine (8/16). Currently being treated for wound infection/cellulitis. WBC trending up. Pharmacy has been consulted for vancomycin/zosyn dosing.  Plan: Start Loading Dose: Vancomycin 2000 mg IV once. Followed by vancomycin 1000 mg IV q8h Goal trough 15-20 mcg/mL.   Start Zosyn 3.375g IV at 100 mL/hr q8h; 4 hr infusion.  Monitor microbiology cultures (blood, urine), CBC, BMP, BUN, Scr. Monitor vancomycin trough levels   Height: 5\' 8"  (172.7 cm) Weight: 81.9 kg (180 lb 8.9 oz) IBW/kg (Calculated) : 68.4  Temp (24hrs), Avg:100 F (37.8 C), Min:98.3 F (36.8 C), Max:101.6 F (38.7 C)  Recent Labs  Lab 05/30/20 0955 05/30/20 0955 05/30/20 1054 05/30/20 1521 05/30/20 1721 05/31/20 0108 06/01/20 0231 06/02/20 0125 06/03/20 0218 06/04/20 0056  WBC 13.4*   < >  --   --   --  14.8* 13.9* 17.8* 17.9* 18.3*  CREATININE 1.35*   < >  --   --   --  1.11 0.86 0.94 0.82 0.83  LATICACIDVEN 1.4  --  2.8* 1.1 1.5  --   --   --   --   --    < > = values in this interval not displayed.    Estimated Creatinine Clearance: 104.2 mL/min (by C-G formula based on SCr of 0.83 mg/dL).    Allergies  Allergen Reactions  . Scallops [Shellfish Allergy] Anaphylaxis    Antimicrobials this admission: 8/12 Cefepime >> 8/13 8/12 Vancomycin >> 8/13 8/12 Flagyl >> 8/13 8/13 PenG continuous infusion >> 8/16 8/16 Amoxicillin >> 8/21 8/18 Vancomycin >>  8/18 Zosyn >>   Microbiology results: 8/12 BCx: GROUP B STREP 8/12 UCx: NO GROWTH 8/17 UCx: pending 8/17 BCx NO GROWTH 8/13 MRSA PCR: Negative  Thank you for allowing pharmacy to be a part of this patient's care.  9/13  PharmD Candidate 06/05/2020 9:43 AM

## 2020-06-05 NOTE — Progress Notes (Signed)
Family Medicine Teaching Service Daily Progress Note Intern Pager: [phone]  Patient name: Sean Carter Medical record number: [id number] Date of birth: October 17, 1971 Age: 49 y.o. Gender: male  Primary Care Provider: Patient, No Pcp Per Consultants: Ortho, vascular surgery Code Status: Full  Pt Overview and Major Events to Date:  Admitted 8/12  Assessment and Plan: Sean Carter a 48 y.o.malepresenting with 2 weeks of progressively worsening left great toe pain, now with color changes,patientalso experiencing flulike symptoms. PMH is significant fortype 2 diabetes and seasonal allergies.  GBS Bacteremia, improving  Gas gangrene ofleftfoot,first digit s/p left great toe amputation, resolved POD #5. WBC  Lab pending, fever of 101.14F overnight. The patient did receive his COVID vaccination yesterday. Antibiotics narrowed to Penicillin to cover for Group B strep.  S/p IV cefepime, vancomycin, Flagyl (8/12-8/13).  Wound VAC over left toe draining serosanguinous fluid. Tender on palpation. 2+ pitting edema of LE. No RE edema.  Warm/ well perfused.  -Ortho consulted, appreciate recommendations. LW vascular US yesterday showed right and left ABI wnl. -Vascular consulted, appreciate recommendations -Stop IV PCN G (8/13-8/16), started Amoxicillin 500 TID (8/16) for total of 10 day tx -Daily CBC, BMP -Vitals per floor routine -PT/OT eval and treat--recommend HHPT/OT, rolling walker, wheelchair with cushion, 3in1, heel-walker boot -COVID-19 Moderna Vaccine--completed 8/16 -New fever 8/17 and 8/18: blood and urine cultures pending, CXR wnl -may need to consider provina wound vac as it is less maintenance once patient able to discharge.  Concern for Infection Pt is POD#5 after a left ray amputation. He has continued to have mild redness of his calf during this time, but today there was notable focal change that is concerning for ischemia. Pt has also had new onset of fever starting 8/17,  the pt had his COVID vaccination the night before and could possible be related.  -Continue daily CBC monitoring (WBC 8/17 18.3) -Blood and urine cultures pending  -Dr. Arbie Cookey with vascular surgery has been notified of the situation -Left tib-fib x-ray ordered. -Vanc and Zosyn to be started   T2DM: chronic, uncontrolled CBG overnight 272-307. A1c 11.3 indicating poor control. Pt is not on any diabetic medications at home.  -Carb modified diet  -Daily CBG's -Moderate sliding scale insulin -On Lantus 25U daily, may need to change if pt is made NPO for possibility of surgery, otherwise consider the need to increase Lantus to 35U -We will plan to discharge home with diabetes regimen and close follow-up outpatient -Discharge diabetic meds: Metformin 500mg  BID, Lantus 25U daily  Protein Calorie Malnutrition Albumin 2.0, Total protein 5.2 likely secondary to poor nutrition.   -CMP -Nutrition consult  Hyperlipidemia: Chronic, stable Lipid panel 8/12: TG 247, HDL <10, VLDL 49, LDL not calculated, total cholesterol 125. ASCVD risk 24.2%. - Continue Atorvastatin to 40mg  daily - Counseled on dietary modifications  HTN: Stable BP 145/73, pt states he was upset this morning. Not on home anti-hypertensives. Losartan 12.5mg  started 8/14. -Continue to monitor BPs -Continue Losartan 12.5mg    Normocytic Anemia: Stable Hemoglobin 11.5>10.2>9.5>9>9>8.1 likely in setting of recent surgery. Hb threshold is 7. No known hx of CAD.  - Continue to monitor - Daily CBC  Hyponatremia: improving Sodium 131>130>127>129>128>131.   -Daily BMP - Continue to monitor, better BG control  Hypokalemia: Acute, resolved K 3.6, 3.6 > 4.1>3.9 -Daily BMP -Replete as necessary  FEN/GI: Carb modified PPx: Heparin subcutaneous  Disposition: Plan to d/c with home PT/OT and wound care. Potential discharge tomorrow.  Subjective:  Patient reports that his foot feels  unchanged from previous days and is still  mainly "numb and tingling". He is frustrated that he has not been able to discharge yet due to his insurance issue and wound care. Patient reports that he is also frustrated because he has not seen the surgeon since the surgery and his follow-up appt is scheduled for tomorrow.   Objective: Temp:  [98.3 F (36.8 C)-101.6 F (38.7 C)] 98.7 F (37.1 C) (08/18 0756) Pulse Rate:  [77-108] 77 (08/18 0756) Resp:  [17-22] 18 (08/18 0756) BP: (138-183)/(64-106) 183/84 (08/18 0756) SpO2:  [96 %-100 %] 99 % (08/18 0756)    Physical Exam: General: Alert, cooperative, no acute distress, sitting upright in bed. Cardio: Normal S1 and S2, RRR  Pulm: CTAB, Normal respiratory effort Abdomen: Bowel sounds normal. Abdomen soft and non-tender.  Extremities: Wound VAC over left toe draining serosanguinous fluid. Tender on palpation. 2+ pitting edema of LE with left calf tenderness to palpation. Purple and yellow discolorations noted around focal area of discoloration on the medial left calf (as shown in picture).   Media Information       Laboratory: Recent Labs  Lab 06/02/20 0125 06/03/20 0218 06/04/20 0056  WBC 17.8* 17.9* 18.3*  HGB 9.0* 9.0* 8.1*  HCT 27.3* 27.0* 24.5*  PLT 189 265 348   Recent Labs  Lab 06/02/20 0125 06/03/20 0218 06/04/20 0056  NA 129* 128* 131*  K 3.6 4.1 3.9  CL 93* 90* 93*  CO2 28 27 29   BUN 23* 17 15  CREATININE 0.94 0.82 0.83  CALCIUM 7.7* 7.6* 7.8*  PROT 5.1* 5.2* 5.4*  BILITOT 0.4 0.7 0.5  ALKPHOS 124 126 159*  ALT 47* 39 45*  AST 49* 31 40  GLUCOSE 318* 319* 324*    Imaging/Diagnostic Tests: DG Chest 2 View  Result Date: 06/04/2020 CLINICAL DATA:  Fever. EXAM: CHEST - 2 VIEW COMPARISON:  05/30/2020. FINDINGS: Mediastinum and hilar structures normal. Mild cardiomegaly and pulmonary venous congestion. Low lung volumes. Very mild interstitial prominence cannot be completely excluded. No pleural effusion or pneumothorax. IMPRESSION: 1.  Mild  cardiomegaly and pulmonary venous congestion. 2. Low lung volumes. Very mild interstitial prominence cannot be excluded. Chest is stable from prior exam. Electronically Signed   By: 07/30/2020  Register   On: 06/04/2020 08:47    06/06/2020, DO 06/05/2020, 7:56 AM PGY-1, [facility] FPTS Intern pager: [phone], text pages welcome

## 2020-06-05 NOTE — Progress Notes (Signed)
Orthopedic Tech Progress Note Patient Details:  Sean Carter 25-Oct-1970 [id number]  Ortho Devices Type of Ortho Device: Darco shoe Ortho Device/Splint Location: LLE Ortho Device/Splint Interventions: Ordered, Application   Post Interventions Patient Tolerated: Well Instructions Provided: Care of device   Donald Pore 06/05/2020, 12:42 PM

## 2020-06-05 NOTE — Progress Notes (Signed)
  Progress Note    06/05/2020 10:10 AM 5 Days Post-Op  Subjective: having left leg pain  Vitals:   06/05/20 0330 06/05/20 0756  BP: (!) 145/73 (!) 183/84  Pulse: 80 77  Resp: 20 18  Temp: 98.3 F (36.8 C) 98.7 F (37.1 C)  SpO2: 97% 99%    Physical Exam: aaox3 Non labored respirations Left foot with edema Left great toe amp site cdi with adequate bleeding  CBC    Component Value Date/Time   WBC 18.3 (H) 06/04/2020 0056   RBC 2.84 (L) 06/04/2020 0056   HGB 8.1 (L) 06/04/2020 0056   HCT 24.5 (L) 06/04/2020 0056   PLT 348 06/04/2020 0056   MCV 86.3 06/04/2020 0056   MCH 28.5 06/04/2020 0056   MCHC 33.1 06/04/2020 0056   RDW 12.7 06/04/2020 0056   LYMPHSABS 1.2 05/30/2020 0955   MONOABS 1.0 05/30/2020 0955   EOSABS 0.0 05/30/2020 0955   BASOSABS 0.0 05/30/2020 0955    BMET    Component Value Date/Time   NA 131 (L) 06/04/2020 0056   K 3.9 06/04/2020 0056   CL 93 (L) 06/04/2020 0056   CO2 29 06/04/2020 0056   GLUCOSE 324 (H) 06/04/2020 0056   BUN 15 06/04/2020 0056   CREATININE 0.83 06/04/2020 0056   CALCIUM 7.8 (L) 06/04/2020 0056   GFRNONAA >60 06/04/2020 0056   GFRAA >60 06/04/2020 0056    INR    Component Value Date/Time   INR 1.1 05/30/2020 1049     Intake/Output Summary (Last 24 hours) at 06/05/2020 1010 Last data filed at 06/05/2020 0600 Gross per 24 hour  Intake --  Output 2975 ml  Net -2975 ml     Assessment/plan:  50 y.o. male is s/p left great toe amp with rash on left leg and persistent leukocytosis. Amputation site is clean and wet to dry placed. darko shoe ordered, heel weight bear as tolerated. I have ordered venous duplex. Possibly replace wound vac tomorrow.     Taim Wurm C. Randie Heinz, MD [facility] Office: [phone] Pager: [phone]  06/05/2020 10:10 AM

## 2020-06-05 NOTE — Progress Notes (Signed)
FPTS Interim Progress Note  S: Went to see patient because he continues to spike fevers tonight (Tmax 102.4 at 2225). Patient states he feels fine but does endorse tightness and swelling of his LLE. He asks if being under blankets could raise his temp since he seems to spike fevers at night. He is also wondering if the antibiotics could be causing his fever. Patient otherwise has no complaints.   O: BP 127/65 (BP Location: Left Arm)   Pulse 89   Temp (!) 101.4 F (38.6 C) (Oral)   Resp 18   Ht 5\' 8"  (1.727 m)   Wt 81.9 kg   SpO2 98%   BMI 27.45 kg/m   Heart: RRR  Lungs: CTA bilaterally. Normal respiratory effort  Extremities: warm. L extremity with 3+ pitting edema to knee. Erythema of L calf with area of purple discoloration. LLE very tender to palpation   A/P:  Fever of unclear etiology Possibly 2/2 to COVID vaccine vs LLE infection vs antibiotic induced  - recheck temp in 30 minutes, if fever persists will give Ibuprofen  - Abx already broadened today. Will reassess tomorrow - blood and urine cultures negative, CXR wnl - consider re imaging LLE tomorrow   , DO 06/05/2020, 11:03 PM PGY-1, [facility] Family Medicine Service pager [phone]

## 2020-06-05 NOTE — Progress Notes (Signed)
Left lower extremity venous duplex has been completed. Preliminary results can be found in CV Proc through chart review.  Results were given to the patient's nurse, Duwayne Heck.  06/05/20 12:16 PM Olen Cordial RVT

## 2020-06-05 NOTE — Progress Notes (Addendum)
Interim progress note  Patient was visited by Dr. Clayborne Artist this morning, she expressed concern for new lesion on the patient's left medial calf proximal to his ray amputation where he had gas gangrene.  It was noted the patient has continued to have leukocytosis and been favoring over the last couple days.  It was initially thought that this was potentially due to the patient receiving his Covid vaccination.  I visited the patient in his room.  On physical exam appreciated a nonblanching ecchymotic/purpuric lesion with what appeared to be blisters over the lesion with surrounding erythema.  Additionally, patient's left calf is very warm to the touch at the site of this lesion and is more swollen in comparison to the right calf.     05/30/2020 - no medial calf lesion appreciated, however erythema present.   Patient day 1 postop with erythema without distinct medial calf lesion, erythema persists.   06/05/2020 - medial calf wound appreciated   06/05/2020 medial purpuric/ecchymotic lesion with surrounding erythema, superficial gas appreciated   Plan: -Will obtain left lower extremity x-ray -Reached out to Dr. Tawanna Cooler Early with vascular surgery, he will see the patient today.  -Spoke with pharmacy, broadening patient's antibiotics to vancomycin and Zosyn -Patient made n.p.o. (patient had breakfast this morning already) -Called patient's nurse and notified her of the changes in his orders   Peggyann Shoals, DO [facility], PGY-3 06/05/2020 9:18 AM [phone]

## 2020-06-05 NOTE — Progress Notes (Signed)
Inpatient Diabetes Program Recommendations  AACE/ADA: New Consensus Statement on Inpatient Glycemic Control   Target Ranges:  Prepandial:   less than 140 mg/dL      Peak postprandial:   less than 180 mg/dL (1-2 hours)      Critically ill patients:  140 - 180 mg/dL   Results for Sean Carter, Sean Carter (MRN [id number]) as of 06/05/2020 08:49  Ref. Range 06/04/2020 06:54 06/04/2020 13:27 06/04/2020 16:42 06/04/2020 20:49 06/05/2020 07:58  Glucose-Capillary Latest Ref Range: 70 - 99 mg/dL 654 (H) 650 (H) 354 (H) 272 (H) 291 (H)   Review of Glycemic Control  Diabetes history: DM2 Outpatient Diabetes medications: None Current orders for Inpatient glycemic control: Lantus 25 units daily, Novolog 0-15 units TID with meals  Inpatient Diabetes Program Recommendations:    Insulin-Noted Lantus increased to 25 units daily. If glucose continues to be consistently over 180 mg/dl, please consider ordering Novolog 5 units TID with meals for meal coverage if patient eats at least 50% of meals.  Thanks, Orlando Penner, RN, MSN, CDE Diabetes Coordinator Inpatient Diabetes Program [phone] (Team Pager from 8am to 5pm)

## 2020-06-06 ENCOUNTER — Encounter (HOSPITAL_COMMUNITY): Payer: Self-pay | Admitting: Family Medicine

## 2020-06-06 ENCOUNTER — Inpatient Hospital Stay (HOSPITAL_COMMUNITY): Payer: Self-pay

## 2020-06-06 ENCOUNTER — Inpatient Hospital Stay: Payer: Self-pay

## 2020-06-06 DIAGNOSIS — E1151 Type 2 diabetes mellitus with diabetic peripheral angiopathy without gangrene: Secondary | ICD-10-CM | POA: Diagnosis present

## 2020-06-06 DIAGNOSIS — I10 Essential (primary) hypertension: Secondary | ICD-10-CM

## 2020-06-06 DIAGNOSIS — R509 Fever, unspecified: Secondary | ICD-10-CM

## 2020-06-06 DIAGNOSIS — D72829 Elevated white blood cell count, unspecified: Secondary | ICD-10-CM

## 2020-06-06 DIAGNOSIS — E1159 Type 2 diabetes mellitus with other circulatory complications: Secondary | ICD-10-CM

## 2020-06-06 DIAGNOSIS — I82890 Acute embolism and thrombosis of other specified veins: Secondary | ICD-10-CM | POA: Diagnosis not present

## 2020-06-06 DIAGNOSIS — I152 Hypertension secondary to endocrine disorders: Secondary | ICD-10-CM

## 2020-06-06 DIAGNOSIS — I96 Gangrene, not elsewhere classified: Secondary | ICD-10-CM | POA: Diagnosis present

## 2020-06-06 LAB — CBC WITH DIFFERENTIAL/PLATELET
Abs Immature Granulocytes: 0.35 10*3/uL — ABNORMAL HIGH (ref 0.00–0.07)
Basophils Absolute: 0.1 10*3/uL (ref 0.0–0.1)
Basophils Relative: 0 %
Eosinophils Absolute: 0.2 10*3/uL (ref 0.0–0.5)
Eosinophils Relative: 1 %
HCT: 27.1 % — ABNORMAL LOW (ref 39.0–52.0)
Hemoglobin: 8.6 g/dL — ABNORMAL LOW (ref 13.0–17.0)
Immature Granulocytes: 2 %
Lymphocytes Relative: 20 %
Lymphs Abs: 3.4 10*3/uL (ref 0.7–4.0)
MCH: 27.6 pg (ref 26.0–34.0)
MCHC: 31.7 g/dL (ref 30.0–36.0)
MCV: 86.9 fL (ref 80.0–100.0)
Monocytes Absolute: 1.3 10*3/uL — ABNORMAL HIGH (ref 0.1–1.0)
Monocytes Relative: 8 %
Neutro Abs: 12.2 10*3/uL — ABNORMAL HIGH (ref 1.7–7.7)
Neutrophils Relative %: 69 %
Platelets: 478 10*3/uL — ABNORMAL HIGH (ref 150–400)
RBC: 3.12 MIL/uL — ABNORMAL LOW (ref 4.22–5.81)
RDW: 12.8 % (ref 11.5–15.5)
WBC: 17.5 10*3/uL — ABNORMAL HIGH (ref 4.0–10.5)
nRBC: 0 % (ref 0.0–0.2)

## 2020-06-06 LAB — COMPREHENSIVE METABOLIC PANEL
ALT: 46 U/L — ABNORMAL HIGH (ref 0–44)
AST: 37 U/L (ref 15–41)
Albumin: 1.9 g/dL — ABNORMAL LOW (ref 3.5–5.0)
Alkaline Phosphatase: 183 U/L — ABNORMAL HIGH (ref 38–126)
Anion gap: 10 (ref 5–15)
BUN: 21 mg/dL — ABNORMAL HIGH (ref 6–20)
CO2: 27 mmol/L (ref 22–32)
Calcium: 8 mg/dL — ABNORMAL LOW (ref 8.9–10.3)
Chloride: 93 mmol/L — ABNORMAL LOW (ref 98–111)
Creatinine, Ser: 0.82 mg/dL (ref 0.61–1.24)
GFR calc Af Amer: >60 mL/min (ref >60)
GFR calc non Af Amer: >60 mL/min (ref >60)
Glucose, Bld: 264 mg/dL — ABNORMAL HIGH (ref 70–99)
Potassium: 4.2 mmol/L (ref 3.5–5.1)
Sodium: 130 mmol/L — ABNORMAL LOW (ref 135–145)
Total Bilirubin: 0.7 mg/dL (ref 0.3–1.2)
Total Protein: 5.7 g/dL — ABNORMAL LOW (ref 6.5–8.1)

## 2020-06-06 LAB — HEPARIN LEVEL (UNFRACTIONATED)
Heparin Unfractionated: 0.24 IU/mL — ABNORMAL LOW (ref 0.30–0.70)
Heparin Unfractionated: <0.1 IU/mL — ABNORMAL LOW (ref 0.30–0.70)

## 2020-06-06 LAB — GLUCOSE, CAPILLARY
Glucose-Capillary: 277 mg/dL — ABNORMAL HIGH (ref 70–99)
Glucose-Capillary: 336 mg/dL — ABNORMAL HIGH (ref 70–99)
Glucose-Capillary: 252 mg/dL — ABNORMAL HIGH (ref 70–99)
Glucose-Capillary: 176 mg/dL — ABNORMAL HIGH (ref 70–99)

## 2020-06-06 LAB — ECHOCARDIOGRAM COMPLETE
Area-P 1/2: 4.89 cm2
Calc EF: 63.4 %
Height: 68 in
S' Lateral: 2.7 cm
Single Plane A2C EF: 66.3 %
Single Plane A4C EF: 62.4 %
Weight: 2888.91 oz

## 2020-06-06 MED ORDER — HEPARIN BOLUS VIA INFUSION
4100.0000 [IU] | Freq: Once | INTRAVENOUS | Status: AC
  Administered 2020-06-06: 4100 [IU] via INTRAVENOUS
  Filled 2020-06-06: qty 4100

## 2020-06-06 MED ORDER — HEPARIN (PORCINE) 25000 UT/250ML-% IV SOLN
1550.0000 [IU]/h | INTRAVENOUS | Status: DC
  Administered 2020-06-06: 1300 [IU]/h via INTRAVENOUS
  Administered 2020-06-07: 1550 [IU]/h via INTRAVENOUS
  Filled 2020-06-06 (×2): qty 250

## 2020-06-06 MED ORDER — IBUPROFEN 200 MG PO TABS
400.0000 mg | ORAL_TABLET | Freq: Four times a day (QID) | ORAL | Status: DC | PRN
  Administered 2020-06-06: 400 mg via ORAL
  Filled 2020-06-06: qty 2

## 2020-06-06 MED ORDER — SODIUM CHLORIDE 0.9 % IV SOLN
2.0000 g | INTRAVENOUS | Status: DC
  Administered 2020-06-06: 2 g via INTRAVENOUS
  Filled 2020-06-06: qty 20
  Filled 2020-06-06: qty 2

## 2020-06-06 MED ORDER — INSULIN ASPART 100 UNIT/ML ~~LOC~~ SOLN
5.0000 [IU] | Freq: Three times a day (TID) | SUBCUTANEOUS | Status: DC
  Administered 2020-06-06 – 2020-06-07 (×5): 5 [IU] via SUBCUTANEOUS

## 2020-06-06 NOTE — Progress Notes (Signed)
Family Medicine Teaching Service Daily Progress Note Intern Pager: [phone]  Patient name: Sean Carter Medical record number: [id number] Date of birth: 08/29/1971 Age: 49 y.o. Gender: male  Primary Care Provider: Patient, No Pcp Per Consultants: Ortho, vascular surgery, ID Code Status: Full  Pt Overview and Major Events to Date:  Admitted 8/12  Assessment and Plan: Sean Carter a 48 y.o.malepresenting with 2 weeks of progressively worsening left great toe pain, now with color changes,patientalso experiencing flulike symptoms. PMH is significant fortype 2 diabetes and seasonal allergies.   Unexplained Fever Pt has also had new onset of fever starting 8/17 with a Tmax overnight of 102.4. The pt had his COVID vaccination on 8/16.  He has continued to have fevers with new concern for fever due to infection--abx were broadened yesterday with negative blood and urine cultures and normal CXR (8/17). Recent WBC trend 18.3(8/17)>15.5>17.5(8/19). No other notable possible sources for infection. Blood and urine cultures (8/17) negative, pt on abx when collected. -Continue daily CBC monitoring (WBC 8/17 18.3) -Vanc and Zosyn started 8/18 -Consider a possible "abx rest" to see if fevers related to abx -Repeating blood culture -Consider consulting ID for recommendations   Superficial Vein Thrombosis Involving Left Great Saphenous Vein Pt is POD#5 after a left ray amputation. He has continued to have mild redness of his calf during this time, but on 8/18 there was notable focal change that is concerning for ischemia or infection, vascular surgery was consulted. VAS [place] LE 8/18: Left leg consistent with acute superficial vein thrombosis involving L great saphenous vein (originates in mid-thigh and extends into the distal calf). No evidence of DVT in the left lower extremity. Left tib-fib xray 8/18 was negative for acute osseous findings and gas. -May need to consider need for MRI if worsening  or no resolution. -Vascular surgery consulted and following pt -Start use of warm compresses  -consider getting an ECHO   GBS Bacteremia, improving  Gas gangrene ofleftfoot,first digit s/p left great toe amputation, resolved POD #5. WBC  Lab 17.5, fever of 102.97F overnight. The patient did receive his COVID vaccination 8/16, though the fevers seem to now be out of range for this to be a likely cause. Antibiotics narrowed to Penicillin to cover for Group B strep.  S/p IV cefepime, vancomycin, Flagyl (8/12-8/13). Patient was started on oral Amoxicillin 500 BID to be continued until total of 10 days post amputation. Fevers began on 8/16 and abx were broadened to Vanc and Zosyn on 8/18 -Ortho consulted, appreciate recommendations. LW vascular US (8/14) showed right and left ABI wnl. -Vascular consulted, appreciate recommendations. Recommended Darco shoe. -Stopped IV PCN G (8/13-8/16), stoppedAmoxicillin 500 TID (8/16-8/18). Began Vanc and Zosyn (8/18-).  -Daily CBC, BMP -Vitals per floor routine -PT/OT eval and treat--recommend HHPT/OT, rolling walker, wheelchair with cushion, 3in1, heel-walker boot.  -COVID-19 Moderna Vaccine--completed 8/16 -may need to consider provina wound vac (if wound vac still necessary) as it is less maintenance once patient able to discharge.    T2DM: chronic, uncontrolled CBG overnight 272-307. A1c 11.3 indicating poor control. Pt is not on any diabetic medications at home. Discharge diabetic meds: Metformin 500mg  BID, Lantus 25U daily -Carb modified diet  -Daily CBG's -Moderate sliding scale insulin -Lantus 35U daily, Novolog 5U daily with meals (if eating at least 50% of meal) -We will plan to discharge home with diabetes regimen and close follow-up outpatient   Protein Calorie Malnutrition Albumin 2.0, Total protein 5.2 likely secondary to poor nutrition.   -CMP -Nutrition  consult  Hyperlipidemia: Chronic, stable Lipid panel 8/12: TG 247, HDL <10,  VLDL 49, LDL not calculated, total cholesterol 125. ASCVD risk 24.2%. - Continue Atorvastatin to 40mg  daily - Counseled on dietary modifications  HTN: Stable BP 168/86 this morning. Not on home anti-hypertensives. Losartan 12.5mg  started 8/14. -Continue to monitor BPs -Continue Losartan 12.5mg    Normocytic Anemia: Stable Hemoglobin 11.5>10.2>9.5>9>9>8.1 likely in setting of recent surgery. Hb threshold is 7. No known hx of CAD.  - Continue to monitor - Daily CBC  Hyponatremia: improving Sodium 131>130>127>129>128>131>130.   -Daily BMP - Continue to monitor, better BG control  Hypokalemia: Acute, resolved K 3.6, 3.6 > 4.1>3.9 -Daily BMP -Replete as necessary  FEN/GI: Carb modified PPx: Heparin subcutaneous  Disposition: Plan to d/c with home PT/OT and wound care. Potential discharge tomorrow.  Subjective:  Patient reports that when he spikes a temperature at night he will feel sweaty and a little unwell. Otherwise patient reports that he is doing fine and is just concerned about the source of the fever. Vascular surgery was in the room wrapping the amputation site.   Objective: Temp:  [98.4 F (36.9 C)-102.4 F (39.1 C)] 99.3 F (37.4 C) (08/19 0721) Pulse Rate:  [75-92] 80 (08/19 0721) Resp:  [16-18] 18 (08/19 0721) BP: (117-183)/(64-86) 168/86 (08/19 0721) SpO2:  [97 %-100 %] 98 % (08/19 0721)    Physical Exam: General: Alert, cooperative, no acute distress, sitting upright in bed. Cardio: Normal S1 and S2, RRR  Pulm: CTAB, Normal respiratory effort Abdomen: Bowel sounds normal. Abdomen soft and non-tender.  Extremities: Dressing being placed over left toe amputation, no wound vac. Tender on palpation. 2+ pitting edema of LE with left calf tenderness to palpation. Purple and yellow discolorations noted around focal area of discoloration on the medial left calf (as shown in picture). Not much changed from yesterday, area has been demarcated.    L lower extremity  8/19   L lower extremity 8/18    Laboratory: Recent Labs  Lab 06/04/20 0056 06/05/20 1216 06/06/20 0128  WBC 18.3* 15.5* 17.5*  HGB 8.1* 8.5* 8.6*  HCT 24.5* 26.6* 27.1*  PLT 348 481* 478*   Recent Labs  Lab 06/03/20 0218 06/04/20 0056 06/06/20 0128  NA 128* 131* 130*  K 4.1 3.9 4.2  CL 90* 93* 93*  CO2 27 29 27   BUN 17 15 21*  CREATININE 0.82 0.83 0.82  CALCIUM 7.6* 7.8* 8.0*  PROT 5.2* 5.4* 5.7*  BILITOT 0.7 0.5 0.7  ALKPHOS 126 159* 183*  ALT 39 45* 46*  AST 31 40 37  GLUCOSE 319* 324* 264*    Imaging/Diagnostic Tests: DG Chest 2 View  Result Date: 06/04/2020 CLINICAL DATA:  Fever. EXAM: CHEST - 2 VIEW COMPARISON:  05/30/2020. FINDINGS: Mediastinum and hilar structures normal. Mild cardiomegaly and pulmonary venous congestion. Low lung volumes. Very mild interstitial prominence cannot be completely excluded. No pleural effusion or pneumothorax. IMPRESSION: 1.  Mild cardiomegaly and pulmonary venous congestion. 2. Low lung volumes. Very mild interstitial prominence cannot be excluded. Chest is stable from prior exam. Electronically Signed   By: 06/06/2020  Register   On: 06/04/2020 08:47   DG Tibia/Fibula Left  Result Date: 06/05/2020 CLINICAL DATA:  Possible infection after toe surgery EXAM: LEFT TIBIA AND FIBULA - 2 VIEW COMPARISON:  None. FINDINGS: Subcutaneous reticulation diffusely. No soft tissue gas, opaque foreign body, or erosion. Heel spurs and diffuse arterial calcification. IMPRESSION: 1. Generalized subcutaneous reticulation. No soft tissue gas or acute osseous finding. 2. Premature  arterial calcification. Electronically Signed   By: Marnee Spring M.D.   On: 06/05/2020 10:53   VAS [place] LOWER EXTREMITY VENOUS (DVT)  Result Date: 06/05/2020  Lower Venous DVTStudy Indications: Pain, Swelling, and Erythema.  Risk Factors: Surgery 05/31/2020 - Ray Amputation of Left Great Toe with Negative Pressure Vac Placement. Limitations: Poor ultrasound/tissue interface and  patient pain tolerance. Comparison Study: No prior studies. Performing Technologist: Chanda Busing RVT  Examination Guidelines: A complete evaluation includes B-mode imaging, spectral Doppler, color Doppler, and power Doppler as needed of all accessible portions of each vessel. Bilateral testing is considered an integral part of a complete examination. Limited examinations for reoccurring indications may be performed as noted. The reflux portion of the exam is performed with the patient in reverse Trendelenburg.  +-----+---------------+---------+-----------+----------+--------------+ RIGHTCompressibilityPhasicitySpontaneityPropertiesThrombus Aging +-----+---------------+---------+-----------+----------+--------------+ CFV  Full           Yes      Yes                                 +-----+---------------+---------+-----------+----------+--------------+   +---------+---------------+---------+-----------+----------+--------------+ LEFT     CompressibilityPhasicitySpontaneityPropertiesThrombus Aging +---------+---------------+---------+-----------+----------+--------------+ CFV      Full           Yes      Yes                                 +---------+---------------+---------+-----------+----------+--------------+ SFJ      Full                                                        +---------+---------------+---------+-----------+----------+--------------+ FV Prox  Full                                                        +---------+---------------+---------+-----------+----------+--------------+ FV Mid   Full                                                        +---------+---------------+---------+-----------+----------+--------------+ FV DistalFull                                                        +---------+---------------+---------+-----------+----------+--------------+ PFV      Full                                                         +---------+---------------+---------+-----------+----------+--------------+ POP      Full           Yes      Yes                                 +---------+---------------+---------+-----------+----------+--------------+  PTV      Full                                                        +---------+---------------+---------+-----------+----------+--------------+ PERO     Full                                                        +---------+---------------+---------+-----------+----------+--------------+ GSV      None                                         Acute          +---------+---------------+---------+-----------+----------+--------------+ Superficial thrombosis noted in the GSV originates in the mid thigh and extends into the distal calf.    Summary: RIGHT: - No evidence of common femoral vein obstruction.  LEFT: - Findings consistent with acute superficial vein thrombosis involving the left great saphenous vein. - There is no evidence of deep vein thrombosis in the lower extremity.  - No cystic structure found in the popliteal fossa.  *See table(s) above for measurements and observations. Electronically signed by Gretta Beganodd Early MD on 06/05/2020 at 5:04:34 PM.    Final     Evelena LeydenLilland, Kyasia Steuck, DO 06/06/2020, 7:24 AM PGY-1, [facility] FPTS Intern pager: [phone], text pages welcome

## 2020-06-06 NOTE — Progress Notes (Signed)
ANTICOAGULATION CONSULT NOTE - Initial Consult  Pharmacy Consult for Heparin Indication: saphenous vein thrombosis  Allergies  Allergen Reactions   Scallops [Shellfish Allergy] Anaphylaxis    Patient Measurements: Height: 5\' 8"  (172.7 cm) Weight: 81.9 kg (180 lb 8.9 oz) IBW/kg (Calculated) : 68.4  Vital Signs: Temp: 99.3 F (37.4 C) (08/19 0721) Temp Source: Oral (08/19 0400) BP: 168/86 (08/19 0721) Pulse Rate: 80 (08/19 0721)  Labs: Recent Labs    06/04/20 0056 06/04/20 0056 06/05/20 1216 06/06/20 0128  HGB 8.1*   < > 8.5* 8.6*  HCT 24.5*  --  26.6* 27.1*  PLT 348  --  481* 478*  CREATININE 0.83  --   --  0.82   < > = values in this interval not displayed.    Estimated Creatinine Clearance: 105.4 mL/min (by C-G formula based on SCr of 0.82 mg/dL).   Medical History: Past Medical History:  Diagnosis Date   Diabetes mellitus without complication ([name])     Assessment: 49 y.o.malepresenting with 2 weeks of progressively worsening left great toe pain, now with color changes.  Patient found to have left leg consistent with acute superficial vein thrombosis involving L great saphenous vein. Pharmacy has been consulted to initiate a heparin drip.  Goal of Therapy:  Heparin level 0.3-0.7 units/ml Monitor platelets by anticoagulation protocol: Yes   Plan:  Give 4100 units bolus x 1 Start heparin infusion at 1300 units/hr Check anti-Xa level in 6 hours and daily while on heparin Continue to monitor H&H and platelets  53, PharmD, [facility] Clinical Pharmacist Please see AMION for all Pharmacists' Contact Phone Numbers 06/06/2020, 12:46 PM

## 2020-06-06 NOTE — Consult Note (Signed)
[facility]    Date of Admission:  05/30/2020     Total days of antibiotics 7   Vanc + Zosyn                Reason for Consult: Fever    Referring Provider: Hensel Primary Care Provider: Patient, No Pcp Per    Assessment: Sean Carter is a 49 y.o. male with diabetes who presented for care after 2 week history of worsening left great toe pain. He was found to have necrotizing fasciitis infection of the left great toe and proceeded with amputation. He had secondary bacteremia due to group b streptococcus at the time as well. Since amputation he has not really had improvement in leukocytosis and daily patterned fevers. On exam he has a systolic heart murmur that appears to be new. Given recent bacteremia would recommend echocardiogram to evaluate for endocarditis. I would continue IV coverage for GBS bacteremia until we have a little improvement in counts.   Other considerations for fevers could be his vein thrombosis. He is otherwise very stable and comfortable appearing. Wound photos appear clean and without signs of infection. Doubt related to Moderna vaccine given pattern that has been present throughout admission prior to vaccine.     Plan: 1. Change antibiotics to IV ceftriaxone + vancomycin for now until repeat blood cultures return  2. Follow micro 3. Follow TTE     Principal Problem:   Fever Active Problems:   Diabetes mellitus type II, uncontrolled ([name])   Bacteremia due to group B Streptococcus   Amputation of left great toe ([name])   Superficial vein thrombosis   Hypertension associated with diabetes ([name])   Gangrene of toe of left foot ([name])   . atorvastatin  40 mg Oral Daily  . heparin  4,100 Units Intravenous Once  . insulin aspart  0-15 Units Subcutaneous TID WC  . insulin aspart  5 Units Subcutaneous TID WC  . insulin glargine  35 Units Subcutaneous Daily  . losartan  12.5 mg Oral Daily  . nutrition supplement (JUVEN)  1  packet Oral BID BM  . Ensure Max Protein  11 oz Oral BID    HPI: Sean Carter is a 49 y.o. male admitted 06/01/2020 for left foot pain.   Presented with 2 weeks h/o progressively worsening left great toe pain including swelling, discoloration and flu-like symptoms that started a few days prior to admission after he stubbed his toe.   In ER found to have SIRS presentation with HR 120s, Tm 102.8 F, WBC 13K. Received vanc + cefepime + metronidazole empirically. Blood cultures returned positive for group b strep and abx were narrowed to PCN. Radiography of the foot was notable for early osteomyelitis with gas tracking to the soft tissues of the great toe.   Dr. Lajoyce Corners saw the patient and recommended vascular consultation whre he underwent vascular assessment - underwent amputation 8/13 of the great toe. Pain has resolved in the foot since this procedure.   Postoperatively he developed fever 8/17 after receiving first dose COVID vaccine. He states that he feels he has continued to have fevers throughout the hospitalization pretty cyclically without any improvement. Antibiotics were escalated to vancomycin + zosyn yesterday.  Also found to have an extensive superficial vein thrombosis in left great saphenous vein (midthigh - distal calf); now on anticoagulation. His amputation site is healing well without any signs of ongoing infection. We were asked to  evaluate with ongoing fevers.   No other rashes or lesions to speak of. 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    Review of Systems: Review of Systems  Constitutional: Positive for fever. Negative for chills, malaise/fatigue and weight loss.  HENT: Negative for sore throat.   Respiratory: Negative for cough and shortness of breath.   Cardiovascular: Positive for leg swelling. Negative for chest pain and palpitations.  Genitourinary: Negative for dysuria.  Musculoskeletal: Negative for back pain.  Skin: Negative for itching and rash.    Neurological: Negative for dizziness, weakness and headaches.    Past Medical History:  Diagnosis Date  . Diabetes mellitus without complication ([name])   . Gangrene of toe of left foot ([name])     Social History   Tobacco Use  . Smoking status: Never Smoker  . Smokeless tobacco: Never Used  Vaping Use  . Vaping Use: Never used  Substance Use Topics  . Alcohol use: Not on file  . Drug use: Never    History reviewed. No pertinent family history. Allergies  Allergen Reactions  . Scallops [Shellfish Allergy] Anaphylaxis    OBJECTIVE: Blood pressure (!) 168/86, pulse 80, temperature 99.3 F (37.4 C), resp. rate 18, height 5\' 8"  (1.727 m), weight 81.9 kg, SpO2 98 %.  Physical Exam Vitals reviewed.  Constitutional:      Appearance: Normal appearance.     Comments: Sitting up comfortably in bed eating and watching TV.   HENT:     Mouth/Throat:     Mouth: Mucous membranes are moist.     Pharynx: Oropharynx is clear.  Eyes:     General: No scleral icterus.    Pupils: Pupils are equal, round, and reactive to light.  Cardiovascular:     Rate and Rhythm: Normal rate and regular rhythm.     Heart sounds: Murmur (systolic 2/6 murmur, loudest at apex) heard.   Pulmonary:     Effort: Pulmonary effort is normal. No respiratory distress.     Breath sounds: Normal breath sounds.  Abdominal:     General: Bowel sounds are normal. There is no distension.     Palpations: Abdomen is soft.  Musculoskeletal:        General: Normal range of motion.     Comments: R foot wrapped in clean dry dressings.   Skin:    General: Skin is warm and dry.     Capillary Refill: Capillary refill takes less than 2 seconds.     Findings: No lesion or rash.  Neurological:     General: No focal deficit present.     Mental Status: He is alert and oriented to person, place, and time.  Psychiatric:        Mood and Affect: Mood normal.     Lab Results Lab Results  Component Value Date   WBC 17.5  (H) 06/06/2020   HGB 8.6 (L) 06/06/2020   HCT 27.1 (L) 06/06/2020   MCV 86.9 06/06/2020   PLT 478 (H) 06/06/2020    Lab Results  Component Value Date   CREATININE 0.82 06/06/2020   BUN 21 (H) 06/06/2020   NA 130 (L) 06/06/2020   K 4.2 06/06/2020   CL 93 (L) 06/06/2020   CO2 27 06/06/2020    Lab Results  Component Value Date   ALT 46 (H) 06/06/2020   AST 37 06/06/2020   ALKPHOS 183 (H) 06/06/2020   BILITOT 0.7 06/06/2020     Microbiology: Recent Results (from the  past 240 hour(s))  Blood Culture (routine x 2)     Status: Abnormal   Collection Time: 05/30/20 10:49 AM   Specimen: BLOOD  Result Value Ref Range Status   Specimen Description BLOOD BLOOD LEFT HAND  Final   Special Requests   Final    BOTTLES DRAWN AEROBIC AND ANAEROBIC Blood Culture adequate volume   Culture  Setup Time   Final    ANAEROBIC BOTTLE ONLY GRAM POSITIVE COCCI Organism ID to follow CRITICAL RESULT CALLED TO, READ BACK BY AND VERIFIED WITH: G. ABBOTT PHARMD, AT 0645 05/31/20 BY D. VANHOOK GRAM POSITIVE COCCI IN CHAINS AEROBIC BOTTLE ONLY CRITICAL VALUE NOTED.  VALUE IS CONSISTENT WITH PREVIOUSLY REPORTED AND CALLED VALUE. 05/31/20 1823 EB Performed at [facility], [address] [place], [place] [address]    Culture GROUP B STREP(S.AGALACTIAE)ISOLATED (A)  Final   Report Status 06/02/2020 FINAL  Final   Organism ID, Bacteria GROUP B STREP(S.AGALACTIAE)ISOLATED  Final      Susceptibility   Group b strep(s.agalactiae)isolated - MIC*    CLINDAMYCIN RESISTANT Resistant     AMPICILLIN <=0.25 SENSITIVE Sensitive     ERYTHROMYCIN 2 RESISTANT Resistant     VANCOMYCIN 0.5 SENSITIVE Sensitive     CEFTRIAXONE <=0.12 SENSITIVE Sensitive     LEVOFLOXACIN 1 SENSITIVE Sensitive     * GROUP B STREP(S.AGALACTIAE)ISOLATED  Urine culture     Status: None   Collection Time: 05/30/20 10:49 AM   Specimen: In/Out Cath Urine  Result Value Ref Range Status   Specimen Description IN/OUT CATH URINE  Final    Special Requests NONE  Final   Culture   Final    NO GROWTH Performed at [facility], [address] [place], [place] [address]    Report Status 05/31/2020 FINAL  Final  Blood Culture ID Panel (Reflexed)     Status: Abnormal   Collection Time: 05/30/20 10:49 AM  Result Value Ref Range Status   Enterococcus faecalis NOT DETECTED NOT DETECTED Final   Enterococcus Faecium NOT DETECTED NOT DETECTED Final   Listeria monocytogenes NOT DETECTED NOT DETECTED Final   Staphylococcus species NOT DETECTED NOT DETECTED Final   Staphylococcus aureus (BCID) NOT DETECTED NOT DETECTED Final   Staphylococcus epidermidis NOT DETECTED NOT DETECTED Final   Staphylococcus lugdunensis NOT DETECTED NOT DETECTED Final   Streptococcus species DETECTED (A) NOT DETECTED Final    Comment: CRITICAL RESULT CALLED TO, READ BACK BY AND VERIFIED WITH: G. ABBOTT PHARMD, AT 0645 05/31/20 BY D. VANHOOK    Streptococcus agalactiae DETECTED (A) NOT DETECTED Final    Comment: CRITICAL RESULT CALLED TO, READ BACK BY AND VERIFIED WITH: G. ABBOTT PHARMD, AT 0645 05/31/20 BY D. VANHOOK    Streptococcus pneumoniae NOT DETECTED NOT DETECTED Final   Streptococcus pyogenes NOT DETECTED NOT DETECTED Final   A.calcoaceticus-baumannii NOT DETECTED NOT DETECTED Final   Bacteroides fragilis NOT DETECTED NOT DETECTED Final   Enterobacterales NOT DETECTED NOT DETECTED Final   Enterobacter cloacae complex NOT DETECTED NOT DETECTED Final   Escherichia coli NOT DETECTED NOT DETECTED Final   Klebsiella aerogenes NOT DETECTED NOT DETECTED Final   Klebsiella oxytoca NOT DETECTED NOT DETECTED Final   Klebsiella pneumoniae NOT DETECTED NOT DETECTED Final   Proteus species NOT DETECTED NOT DETECTED Final   Salmonella species NOT DETECTED NOT DETECTED Final   Serratia marcescens NOT DETECTED NOT DETECTED Final   Haemophilus influenzae NOT DETECTED NOT DETECTED Final   Neisseria meningitidis NOT DETECTED NOT DETECTED  Final   Pseudomonas  aeruginosa NOT DETECTED NOT DETECTED Final   Stenotrophomonas maltophilia NOT DETECTED NOT DETECTED Final   Candida albicans NOT DETECTED NOT DETECTED Final   Candida auris NOT DETECTED NOT DETECTED Final   Candida glabrata NOT DETECTED NOT DETECTED Final   Candida krusei NOT DETECTED NOT DETECTED Final   Candida parapsilosis NOT DETECTED NOT DETECTED Final   Candida tropicalis NOT DETECTED NOT DETECTED Final   Cryptococcus neoformans/gattii NOT DETECTED NOT DETECTED Final    Comment: Performed at [facility], [address] [place], [place] [address]  SARS Coronavirus 2 by RT PCR (hospital order, performed in [facility] hospital lab) Nasopharyngeal Nasopharyngeal Swab     Status: None   Collection Time: 05/30/20 10:54 AM   Specimen: Nasopharyngeal Swab  Result Value Ref Range Status   SARS Coronavirus 2 NEGATIVE NEGATIVE Final    Comment: (NOTE) SARS-CoV-2 target nucleic acids are NOT DETECTED.  The SARS-CoV-2 RNA is generally detectable in upper and lower respiratory specimens during the acute phase of infection. The lowest concentration of SARS-CoV-2 viral copies this assay can detect is 250 copies / mL. A negative result does not preclude SARS-CoV-2 infection and should not be used as the sole basis for treatment or other patient management decisions.  A negative result may occur with improper specimen collection / handling, submission of specimen other than nasopharyngeal swab, presence of viral mutation(s) within the areas targeted by this assay, and inadequate number of viral copies (<250 copies / mL). A negative result must be combined with clinical observations, patient history, and epidemiological information.  Fact Sheet for Patients:   BoilerBrush.com.cy  Fact Sheet for Healthcare Providers: https://pope.com/  This test is not yet approved or  cleared by the [place] FDA and has been authorized for detection  and/or diagnosis of SARS-CoV-2 by FDA under an Emergency Use Authorization (EUA).  This EUA will remain in effect (meaning this test can be used) for the duration of the COVID-19 declaration under Section 564(b)(1) of the Act, 21 U.S.C. section 360bbb-3(b)(1), unless the authorization is terminated or revoked sooner.  Performed at [facility], [address] [place], [place] [address]   Blood Culture (routine x 2)     Status: Abnormal   Collection Time: 05/30/20 10:54 AM   Specimen: BLOOD  Result Value Ref Range Status   Specimen Description BLOOD BLOOD RIGHT FOREARM  Final   Special Requests   Final    BOTTLES DRAWN AEROBIC AND ANAEROBIC Blood Culture adequate volume   Culture  Setup Time   Final    GRAM POSITIVE COCCI IN CHAINS AEROBIC BOTTLE ONLY CRITICAL VALUE NOTED.  VALUE IS CONSISTENT WITH PREVIOUSLY REPORTED AND CALLED VALUE.    Culture (A)  Final    GROUP B STREP(S.AGALACTIAE)ISOLATED SUSCEPTIBILITIES PERFORMED ON PREVIOUS CULTURE WITHIN THE LAST 5 DAYS. Performed at [facility], [address] [place], [place] [address]    Report Status 06/02/2020 FINAL  Final  MRSA PCR Screening     Status: None   Collection Time: 05/31/20  4:48 AM   Specimen: Nasal Mucosa; Nasopharyngeal  Result Value Ref Range Status   MRSA by PCR NEGATIVE NEGATIVE Final    Comment:        The GeneXpert MRSA Assay (FDA approved for NASAL specimens only), is one component of a comprehensive MRSA colonization surveillance program. It is not intended to diagnose MRSA infection nor to guide or monitor treatment for MRSA infections. Performed  at [facility], [address] [place], [place] [address]   Culture, Urine     Status: None   Collection Time: 06/04/20 10:16 AM   Specimen: Urine, Random  Result Value Ref Range Status   Specimen Description URINE, RANDOM  Final   Special Requests NONE  Final   Culture   Final    NO GROWTH Performed at [facility],  [address] [place], [place] [address]    Report Status 06/05/2020 FINAL  Final  Culture, blood (routine x 2)     Status: None (Preliminary result)   Collection Time: 06/04/20 11:55 AM   Specimen: BLOOD  Result Value Ref Range Status   Specimen Description BLOOD LEFT ANTECUBITAL  Final   Special Requests   Final    BOTTLES DRAWN AEROBIC AND ANAEROBIC Blood Culture adequate volume   Culture   Final    NO GROWTH 2 DAYS Performed at [facility], [address], [place], [place] [address]    Report Status PENDING  Incomplete  Culture, blood (routine x 2)     Status: None (Preliminary result)   Collection Time: 06/04/20 12:01 PM   Specimen: BLOOD  Result Value Ref Range Status   Specimen Description BLOOD RIGHT ANTECUBITAL  Final   Special Requests   Final    BOTTLES DRAWN AEROBIC AND ANAEROBIC Blood Culture adequate volume   Culture   Final    NO GROWTH 2 DAYS Performed at [facility], [address], [place], [place] [address]    Report Status PENDING  Incomplete  Culture, blood (routine x 2)     Status: None (Preliminary result)   Collection Time: 06/06/20 12:53 PM   Specimen: BLOOD RIGHT HAND  Result Value Ref Range Status   Specimen Description BLOOD RIGHT HAND  Final   Special Requests   Final    BOTTLES DRAWN AEROBIC AND ANAEROBIC Blood Culture adequate volume Performed at [facility], [address] [place], [place] [address]    Culture PENDING  Incomplete   Report Status PENDING  Incomplete     Rexene Alberts, MSN, NP-C [facility]  [email or web address] Pager: [phone] Office: [phone] [facility] Main Line: [phone]

## 2020-06-06 NOTE — Progress Notes (Addendum)
Progress Note    06/06/2020 7:56 AM 6 Days Post-Op  Subjective:  Felt the fever overnight. Currently without complaints.  Patient is a 49 year old male who presented to the ED on 05/30/2020 with sepsis secondary to a gangrenous left great toe and underlying diabetes that has been poorly controlled.  Ultimately after resuscitation he was found to have a palpable pedal pulse. He underwent left great toe amputation for source control 05/31/2020 by Dr. Chestine Spore. A wound VAC was placed.  Vitals:   06/06/20 0400 06/06/20 0721  BP: (!) 165/82 (!) 168/86  Pulse: 75 80  Resp: 16 18  Temp: 98.6 F (37 C) 99.3 F (37.4 C)  SpO2: 99% 98%    Physical Exam: Cardiac:  RRR Lungs:  nonlabored Extremities:  LLE: dressing removed. Amp site dry with minimal granulation tissue. Approx. 3 x 2 cm bullous lesion of lower leg. Brawny discoloration of periphery of lesion. No drainage. Tender to palpation. 2+ DP pulse.  No skin issues of right foot.   CBC    Component Value Date/Time   WBC 17.5 (H) 06/06/2020 0128   RBC 3.12 (L) 06/06/2020 0128   HGB 8.6 (L) 06/06/2020 0128   HCT 27.1 (L) 06/06/2020 0128   PLT 478 (H) 06/06/2020 0128   MCV 86.9 06/06/2020 0128   MCH 27.6 06/06/2020 0128   MCHC 31.7 06/06/2020 0128   RDW 12.8 06/06/2020 0128   LYMPHSABS 3.4 06/06/2020 0128   MONOABS 1.3 (H) 06/06/2020 0128   EOSABS 0.2 06/06/2020 0128   BASOSABS 0.1 06/06/2020 0128    BMET    Component Value Date/Time   NA 130 (L) 06/06/2020 0128   K 4.2 06/06/2020 0128   CL 93 (L) 06/06/2020 0128   CO2 27 06/06/2020 0128   GLUCOSE 264 (H) 06/06/2020 0128   BUN 21 (H) 06/06/2020 0128   CREATININE 0.82 06/06/2020 0128   CALCIUM 8.0 (L) 06/06/2020 0128   GFRNONAA >60 06/06/2020 0128   GFRAA >60 06/06/2020 0128     Intake/Output Summary (Last 24 hours) at 06/06/2020 0756 Last data filed at 06/06/2020 8469 Gross per 24 hour  Intake 601.72 ml  Output 3475 ml  Net -2873.28 ml    HOSPITAL  MEDICATIONS Scheduled Meds: . atorvastatin  40 mg Oral Daily  . heparin  5,000 Units Subcutaneous Q8H  . insulin aspart  0-15 Units Subcutaneous TID WC  . insulin glargine  35 Units Subcutaneous Daily  . losartan  12.5 mg Oral Daily  . nutrition supplement (JUVEN)  1 packet Oral BID BM  . Ensure Max Protein  11 oz Oral BID   Continuous Infusions: . piperacillin-tazobactam (ZOSYN)  IV 3.375 g (06/06/20 0644)  . vancomycin 1,000 mg (06/06/20 0516)   PRN Meds:.acetaminophen, guaiFENesin, ibuprofen, oxyCODONE       LE venous ultrasound RIGHT:  - No evidence of common femoral vein obstruction.    LEFT:  - Findings consistent with acute superficial vein thrombosis involving the  left great saphenous vein.  - There is no evidence of deep vein thrombosis in the lower extremity.   - No cystic structure found in the popliteal fossa.   Assessment: POD 8 left great toe amp. Hx DM with palpable pulse. Leukocytosis. Febrile to 102.4. Blood and urine cx negative. Left GSV thrombosis.  Plan: -No evidence of DVT.  Vanc and Zosyn continue. -DVT prophylaxis:  Heparin [place]   Wendi Maya, PA-C [facility] [phone] 06/06/2020  7:56 AM   I have independently interviewed and examined  patient and agree with PA assessment and plan above.  He does have fairly extensive left lower extremity superficial venous thrombosis and I have ordered heparin drip and he will need at least 45 days of anticoagulation upon discharge.  This may be accounting for his fever and leukocytosis as the surgical wound appears to be healing well without any ongoing infection.  He is okay to continue wet-to-dry dressings upon discharge if wound VAC will be difficult to arrange.  Chantay Whitelock C. Randie Heinz, MD [facility] [place] Office: [phone] Pager: [phone]

## 2020-06-06 NOTE — Progress Notes (Signed)
ANTICOAGULATION CONSULT NOTE - Follow Up Consult  Pharmacy Consult for Heparin Indication: saphenous vein thrombosis  Allergies  Allergen Reactions  . Scallops [Shellfish Allergy] Anaphylaxis    Patient Measurements: Height: 5\' 8"  (172.7 cm) Weight: 81.9 kg (180 lb 8.9 oz) IBW/kg (Calculated) : 68.4  Heparin Dosing Weight: 81.9 kg   Labs: Recent Labs    06/04/20 0056 06/04/20 0056 06/05/20 1216 06/06/20 0128 06/06/20 1953 06/06/20 2306  HGB 8.1*   < > 8.5* 8.6*  --   --   HCT 24.5*  --  26.6* 27.1*  --   --   PLT 348  --  481* 478*  --   --   HEPARINUNFRC  --   --   --   --  0.24* <0.10*  CREATININE 0.83  --   --  0.82  --   --    < > = values in this interval not displayed.    Estimated Creatinine Clearance: 105.4 mL/min (by C-G formula based on SCr of 0.82 mg/dL).  Assessment: 48 yr oldmalepresented with 2 weeks of progressively worsening left great toe pain, now with color changes.  Patient was found to have acute superficial vein thrombosis involving L great saphenous vein. Pharmacy has been consulted to initiate heparin; pt was on no anticoagulants PTA.  Heparin level is subtherapeutic at <0.1. Per RN, no problems with IV access or infusion and no bleeding.   Goal of Therapy:  Heparin level 0.3-0.7 units/ml Monitor platelets by anticoagulation protocol: Yes   Plan:  Heparin 2500 unit bolus then increase rate to 1550 units/hr 6 hr heparin level Monitor daily heparin level, CBC Monitor for signs/symptoms of bleeding  Thank you for involving pharmacy in this patient's care.  06/08/20, PharmD, BCPS Clinical Pharmacist 06/06/2020 11:59 PM  **Pharmacist phone directory can be found on [email or web address] listed under [facility]**

## 2020-06-06 NOTE — Progress Notes (Signed)
Physical Therapy Treatment Patient Details Name: Sean Carter MRN: [id number] DOB: 04-Aug-1971 Today's Date: 06/06/2020    History of Present Illness Sean Carter is a 49 y.o. male presenting with 2 weeks of progressively worsening left great toe pain, now with color changes, patient also experiencing flulike symptoms. PMH is significant for type 2 diabetes and seasonal allergies.Pt underwent L ray amputation    PT Comments    Patient received in bed, initially seeming annoyed with therapy but willing to participate when educated about new WB status and need to practice/clear him on gait and step navigation prior to DC. Refused to even try to put Darco on his foot and needed totalA for managing surgical shoe. Able to mobilize generally at a min guard level with RW and heel WBAT in Darco LLE, slow but fairly steady with RW. Would still benefit from [facility] for longer distances however. Much safer overall with mobility when able to weight bear through heel. Spent quite a bit of time in restroom with distant S having productive BM. Left in bed with all needs met, bed alarm active.     Follow Up Recommendations  Home health PT;Supervision for mobility/OOB     Equipment Recommendations  Rolling walker with 5" wheels;Wheelchair (measurements PT);3in1 (PT) (20 inch wide WC, elevating leg rests)    Recommendations for Other Services       Precautions / Restrictions Precautions Precautions: Fall;Other (comment) Precaution Comments: now heel WBAT in Darco Restrictions Weight Bearing Restrictions: No Other Position/Activity Restrictions: heel WBAT in darco L LE    Mobility  Bed Mobility Overal bed mobility: Needs Assistance Bed Mobility: Supine to Sit;Sit to Supine     Supine to sit: Supervision Sit to supine: Supervision   General bed mobility comments: S for safety, increased time and effort  Transfers Overall transfer level: Needs assistance Equipment used: Rolling walker (2  wheeled) Transfers: Sit to/from Stand Sit to Stand: Min guard         General transfer comment: min guard with min cues to maintain heel WB in Darco  Ambulation/Gait Ambulation/Gait assistance: Min guard Gait Distance (Feet): 15 Feet (x2) Assistive device: Rolling walker (2 wheeled) Gait Pattern/deviations: Step-to pattern;Decreased step length - right;Decreased stance time - left;Decreased stride length;Decreased weight shift to left Gait velocity: slow   General Gait Details: able to ambulate with RW and slow effortful pattern but much safer now that he's able to weight bear on L heel   Stairs Stairs: Yes Stairs assistance: Min guard Stair Management: No rails;Backwards;With walker Number of Stairs: 1 General stair comments: backwards ascent/forwards descent with min guard, Mod fading to [place] for sequencing   Wheelchair Mobility    Modified Rankin (Stroke Patients Only)       Balance Overall balance assessment: Needs assistance Sitting-balance support: No upper extremity supported;Feet supported Sitting balance-Leahy Scale: Good     Standing balance support: Single extremity supported;During functional activity Standing balance-Leahy Scale: Fair Standing balance comment: reliant on BUE support                            Cognition Arousal/Alertness: Awake/alert Behavior During Therapy: WFL for tasks assessed/performed Overall Cognitive Status: Within Functional Limits for tasks assessed                                 General Comments: very pleasant and cooperative but still resistant  to idea of WC      Exercises      General Comments        Pertinent Vitals/Pain Pain Assessment: No/denies pain Pain Score: 0-No pain Pain Intervention(s): Limited activity within patient's tolerance;Monitored during session    Home Living                      Prior Function            PT Goals (current goals can now be found  in the care plan section) Acute Rehab PT Goals Patient Stated Goal: be able to manage on my own PT Goal Formulation: With patient Time For Goal Achievement: 06/15/20 Potential to Achieve Goals: Good Progress towards PT goals: Progressing toward goals    Frequency    Min 3X/week      PT Plan Discharge plan needs to be updated    Co-evaluation              AM-PAC PT "6 Clicks" Mobility   Outcome Measure  Help needed turning from your back to your side while in a flat bed without using bedrails?: None Help needed moving from lying on your back to sitting on the side of a flat bed without using bedrails?: None Help needed moving to and from a bed to a chair (including a wheelchair)?: A Little Help needed standing up from a chair using your arms (e.g., wheelchair or bedside chair)?: A Little Help needed to walk in hospital room?: A Little Help needed climbing 3-5 steps with a railing? : A Little 6 Click Score: 20    End of Session Equipment Utilized During Treatment: Gait belt Activity Tolerance: Patient tolerated treatment well Patient left: in bed;with call bell/phone within reach;with bed alarm set Nurse Communication: Mobility status PT Visit Diagnosis: Unsteadiness on feet (R26.81);Other abnormalities of gait and mobility (R26.89);Muscle weakness (generalized) (M62.81);Difficulty in walking, not elsewhere classified (R26.2);Pain Pain - Right/Left: Left Pain - part of body: Ankle and joints of foot     Time: 9323-5573 PT Time Calculation (min) (ACUTE ONLY): 30 min  Charges:  $Gait Training: 23-37 mins                     Windell Norfolk, DPT, PN1   Supplemental Physical Therapist [place]    Pager [phone] Acute Rehab Office [phone]

## 2020-06-06 NOTE — Progress Notes (Signed)
OT Cancellation Note  Patient Details Name: Sean Carter MRN: [id number] DOB: 1971-02-24   Cancelled Treatment:    Reason Eval/Treat Not Completed: Patient declined, no reason specified Pt with new WB through heel with Darco shoe. Attempted OT treatment with pt declining reporting he just did therapy 45 minutes ago and requested OT to come back tomorrow.  Lorre Munroe 06/06/2020, 2:08 PM

## 2020-06-06 NOTE — Progress Notes (Signed)
ANTICOAGULATION CONSULT NOTE - Follow Up Consult  Pharmacy Consult for Heparin Indication: saphenous vein thrombosis  Allergies  Allergen Reactions  . Scallops [Shellfish Allergy] Anaphylaxis    Patient Measurements: Height: 5\' 8"  (172.7 cm) Weight: 81.9 kg (180 lb 8.9 oz) IBW/kg (Calculated) : 68.4  Heparin Dosing Weight: 81.9 kg   Labs: Recent Labs    06/04/20 0056 06/04/20 0056 06/05/20 1216 06/06/20 0128 06/06/20 1953  HGB 8.1*   < > 8.5* 8.6*  --   HCT 24.5*  --  26.6* 27.1*  --   PLT 348  --  481* 478*  --   HEPARINUNFRC  --   --   --   --  0.24*  CREATININE 0.83  --   --  0.82  --    < > = values in this interval not displayed.    Estimated Creatinine Clearance: 105.4 mL/min (by C-G formula based on SCr of 0.82 mg/dL).   Medical History: Past Medical History:  Diagnosis Date  . Diabetes mellitus without complication ([name])   . Gangrene of toe of left foot ([name])     Assessment: 48 yr oldmalepresented with 2 weeks of progressively worsening left great toe pain, now with color changes.  Patient was found to have acute superficial vein thrombosis involving L great saphenous vein. Pharmacy has been consulted to initiate heparin; pt was on no anticoagulants PTA.  Heparin bolus and infusion were ordered to start at 1300, but were not started until ~1645, due to IV access issues. Initial heparin level ~3 hrs after heparin 4100 units IV bolus X 1, followed by heparin infusion at 1300 units/hr, was 0.24 units/ml, which is below the goal range for this pt. H/H, platelets stable. Per RN, no issues with IV or bleeding observed.  Goal of Therapy:  Heparin level 0.3-0.7 units/ml Monitor platelets by anticoagulation protocol: Yes   Plan:  Since initial heparin level was drawn only ~3 hrs after heparin bolus/infusion started, will repeat heparin level at 6 hrs after starting infusion before making any changes to infusion rate F/U heparin level 6 hrs after starting  infusion Monitor daily heparin level, CBC Monitor for signs/symptoms of bleeding  06/08/20, PharmD, BCPS, [facility] Clinical Pharmacist 06/06/2020, 8:44 PM

## 2020-06-06 NOTE — Progress Notes (Signed)
°  Echocardiogram 2D Echocardiogram has been performed.  Sean Carter 06/06/2020, 2:54 PM

## 2020-06-06 NOTE — TOC Progression Note (Signed)
Transition of Care [facility]) - Progression Note    Patient Details  Name: Sean Carter MRN: [id number] Date of Birth: Jan 26, 1971  [facility] of [facility] CM/SW Contact  Lorri Frederick, LCSW Phone Number: 06/06/2020, 1:50 PM  Clinical Narrative:   CSW spoke with pt regarding need for girlfriend to get trained in changing dressing on his foot.  He reports she will be here tonight and he will remind her.  PT recommending wheelchair for pt, but pt states he does not want one at this time.    Expected Discharge Plan: Home w Home Health Services Barriers to Discharge: Inadequate or no insurance  Expected Discharge Plan and Services Expected Discharge Plan: Home w Home Health Services       Living arrangements for the past 2 months: Single Family Home                 DME Arranged: Walker rolling DME Agency: [employer] Date DME Agency Contacted: 06/03/20 Time DME Agency Contacted: 1330 Representative spoke with at DME Agency: Ian Malkin             Social Determinants of Health (SDOH) Interventions    Readmission Risk Interventions No flowsheet data found.

## 2020-06-06 NOTE — Progress Notes (Addendum)
Nutrition Follow-up  DOCUMENTATION CODES:   Not applicable  INTERVENTION:  -Continue Ensure Max po BID, each supplement provides 150 kcal and 30 grams of protein  -Continue 1 packet Juven BID, each packet provides 95 calories, 2.5 grams of protein (collagen), and 9.8 grams of carbohydrate (3 grams sugar); also contains 7 grams of L-arginine and L-glutamine, 300 mg vitamin C, 15 mg vitamin E, 1.2 mcg vitamin B-12, 9.5 mg zinc, 200 mg calcium, and 1.5 g  Calcium Beta-hydroxy-Beta-methylbutyrate to support wound healing   NUTRITION DIAGNOSIS:   Increased nutrient needs related to wound healing as evidenced by estimated needs.  Ongoing  GOAL:   Patient will meet greater than or equal to 90% of their needs  Progressing  MONITOR:   PO intake, Supplement acceptance, Skin, Weight trends, Labs, I & O's  REASON FOR ASSESSMENT:   Consult Assessment of nutrition requirement/status  ASSESSMENT:   Pt admitted with sepsis 2/2 gangrene first toe of L foot. PMH includes type 2 DM.  8/13 s/p amputation of L great toe with negative pressure wound VAC placement  Pt unavailable at time of RD visit. Pt accepting supplements per RN.   PO Intake: 100% x1 recorded meal  Labs: Na 130 (L), CBGs 212-277 Medications: Novolog, Lantus, Juven BID, Ensure Max BID  Diet Order:   Diet Order            Diet Carb Modified Fluid consistency: Thin; Room service appropriate? Yes  Diet effective now                 EDUCATION NEEDS:   Education needs have been addressed  Skin:  Skin Assessment: Skin Integrity Issues: Skin Integrity Issues:: Wound VAC, Incisions Wound Vac: L foot s/p L great toe amputation Incisions: L foot  Last BM:  8/17  Height:   Ht Readings from Last 1 Encounters:  05/30/20 5\' 8"  (1.727 m)    Weight:   Wt Readings from Last 1 Encounters:  05/30/20 81.9 kg    BMI:  Body mass index is 27.45 kg/m.  Estimated Nutritional Needs:   Kcal:   07/30/20  Protein:  120-130 grams  Fluid:  >/= 2.2L/d    [phone], MS, RD, LDN RD pager number and weekend/on-call pager number located in Amion.

## 2020-06-07 ENCOUNTER — Telehealth: Payer: Self-pay

## 2020-06-07 DIAGNOSIS — I82890 Acute embolism and thrombosis of other specified veins: Secondary | ICD-10-CM

## 2020-06-07 DIAGNOSIS — E1151 Type 2 diabetes mellitus with diabetic peripheral angiopathy without gangrene: Secondary | ICD-10-CM

## 2020-06-07 LAB — GLUCOSE, CAPILLARY
Glucose-Capillary: 256 mg/dL — ABNORMAL HIGH (ref 70–99)
Glucose-Capillary: 234 mg/dL — ABNORMAL HIGH (ref 70–99)
Glucose-Capillary: 214 mg/dL — ABNORMAL HIGH (ref 70–99)

## 2020-06-07 LAB — CBC WITH DIFFERENTIAL/PLATELET
Abs Immature Granulocytes: 0.27 10*3/uL — ABNORMAL HIGH (ref 0.00–0.07)
Basophils Absolute: 0.1 10*3/uL (ref 0.0–0.1)
Basophils Relative: 1 %
Eosinophils Absolute: 0.2 10*3/uL (ref 0.0–0.5)
Eosinophils Relative: 1 %
HCT: 25.8 % — ABNORMAL LOW (ref 39.0–52.0)
Hemoglobin: 8.4 g/dL — ABNORMAL LOW (ref 13.0–17.0)
Immature Granulocytes: 2 %
Lymphocytes Relative: 20 %
Lymphs Abs: 3 10*3/uL (ref 0.7–4.0)
MCH: 28.3 pg (ref 26.0–34.0)
MCHC: 32.6 g/dL (ref 30.0–36.0)
MCV: 86.9 fL (ref 80.0–100.0)
Monocytes Absolute: 1.1 10*3/uL — ABNORMAL HIGH (ref 0.1–1.0)
Monocytes Relative: 8 %
Neutro Abs: 10.2 10*3/uL — ABNORMAL HIGH (ref 1.7–7.7)
Neutrophils Relative %: 68 %
Platelets: 526 10*3/uL — ABNORMAL HIGH (ref 150–400)
RBC: 2.97 MIL/uL — ABNORMAL LOW (ref 4.22–5.81)
RDW: 12.8 % (ref 11.5–15.5)
WBC: 14.9 10*3/uL — ABNORMAL HIGH (ref 4.0–10.5)
nRBC: 0 % (ref 0.0–0.2)

## 2020-06-07 LAB — COMPREHENSIVE METABOLIC PANEL
ALT: 57 U/L — ABNORMAL HIGH (ref 0–44)
AST: 48 U/L — ABNORMAL HIGH (ref 15–41)
Albumin: 2 g/dL — ABNORMAL LOW (ref 3.5–5.0)
Alkaline Phosphatase: 176 U/L — ABNORMAL HIGH (ref 38–126)
Anion gap: 7 (ref 5–15)
BUN: 20 mg/dL (ref 6–20)
CO2: 28 mmol/L (ref 22–32)
Calcium: 8.1 mg/dL — ABNORMAL LOW (ref 8.9–10.3)
Chloride: 95 mmol/L — ABNORMAL LOW (ref 98–111)
Creatinine, Ser: 0.89 mg/dL (ref 0.61–1.24)
GFR calc Af Amer: >60 mL/min (ref >60)
GFR calc non Af Amer: >60 mL/min (ref >60)
Glucose, Bld: 304 mg/dL — ABNORMAL HIGH (ref 70–99)
Potassium: 4.2 mmol/L (ref 3.5–5.1)
Sodium: 130 mmol/L — ABNORMAL LOW (ref 135–145)
Total Bilirubin: 0.5 mg/dL (ref 0.3–1.2)
Total Protein: 6.1 g/dL — ABNORMAL LOW (ref 6.5–8.1)

## 2020-06-07 LAB — HEPARIN LEVEL (UNFRACTIONATED): Heparin Unfractionated: 0.15 IU/mL — ABNORMAL LOW (ref 0.30–0.70)

## 2020-06-07 MED ORDER — RIVAROXABAN 15 MG PO TABS
15.0000 mg | ORAL_TABLET | Freq: Every day | ORAL | 0 refills | Status: DC
Start: 2020-06-07 — End: 2020-06-11

## 2020-06-07 MED ORDER — CEPHALEXIN 500 MG PO CAPS: 500.0000 mg | ORAL_CAPSULE | Freq: Four times a day (QID) | ORAL | 0 refills | Status: AC

## 2020-06-07 MED ORDER — SODIUM CHLORIDE 0.9 % IV SOLN
2.0000 g | INTRAVENOUS | Status: DC
  Administered 2020-06-07: 2 g via INTRAVENOUS
  Filled 2020-06-07: qty 2

## 2020-06-07 MED ORDER — SODIUM CHLORIDE 0.9 % IV SOLN
2.0000 g | Freq: Once | INTRAVENOUS | Status: DC
  Filled 2020-06-07: qty 20

## 2020-06-07 MED ORDER — INSULIN GLARGINE 100 UNIT/ML ~~LOC~~ SOLN: 35.0000 [IU] | Freq: Every day | SUBCUTANEOUS | 0 refills | Status: DC

## 2020-06-07 MED ORDER — PENICILLIN G POTASSIUM 20000000 UNITS IJ SOLR
12.0000 10*6.[IU] | Freq: Two times a day (BID) | INTRAVENOUS | Status: DC
  Filled 2020-06-07: qty 12

## 2020-06-07 MED ORDER — ATORVASTATIN CALCIUM 40 MG PO TABS
40.0000 mg | ORAL_TABLET | Freq: Every day | ORAL | 0 refills | Status: DC
Start: 2020-06-07 — End: 2020-06-11

## 2020-06-07 MED ORDER — HEPARIN BOLUS VIA INFUSION
2500.0000 [IU] | Freq: Once | INTRAVENOUS | Status: AC
  Administered 2020-06-07: 2500 [IU] via INTRAVENOUS
  Filled 2020-06-07: qty 2500

## 2020-06-07 MED ORDER — RIVAROXABAN 15 MG PO TABS
15.0000 mg | ORAL_TABLET | Freq: Every day | ORAL | Status: DC
  Administered 2020-06-07: 15 mg via ORAL
  Filled 2020-06-07: qty 1

## 2020-06-07 MED ORDER — XARELTO VTE STARTER PACK 15 & 20 MG PO TBPK: ORAL_TABLET | ORAL | 0 refills | Status: DC

## 2020-06-07 MED FILL — XARELTO STARTER PACK: 15 & 20 | 30 days supply | Qty: 51 | Fill #0

## 2020-06-07 MED FILL — CEPHALEXIN 500 MG CAPS: 500 | 7 days supply | Qty: 28 | Fill #0

## 2020-06-07 NOTE — Progress Notes (Addendum)
[facility]    Date of Admission:  05/30/2020   Total days of antibiotics 8/day 7 since amputation   ID: Sean Carter is a 49 y.o. male with   Principal Problem:   Fever Active Problems:   Diabetes mellitus type II, uncontrolled ([name])   Bacteremia due to group B Streptococcus   Amputation of left great toe ([name])   Superficial vein thrombosis   Hypertension associated with diabetes ([name])   Gangrene of toe of left foot ([name])   Diabetes mellitus type 2 with peripheral artery disease ([name])    Subjective: Afebrile, still having some discomfort to left thigh at site of acute superficial vein thrombosis involving the  left great saphenous vein. Some weeping of serosanginous fluid noted on exam  Medications:  . atorvastatin  40 mg Oral Daily  . insulin aspart  0-15 Units Subcutaneous TID WC  . insulin aspart  5 Units Subcutaneous TID WC  . insulin glargine  35 Units Subcutaneous Daily  . losartan  12.5 mg Oral Daily  . nutrition supplement (JUVEN)  1 packet Oral BID BM  . Ensure Max Protein  11 oz Oral BID    Objective: Vital signs in last 24 hours: Temp:  [98.6 F (37 C)-99.7 F (37.6 C)] 98.8 F (37.1 C) (08/20 0828) Pulse Rate:  [78-90] 90 (08/20 0828) Resp:  [20] 20 (08/20 0828) BP: (120-151)/(63-78) 120/66 (08/20 0828) SpO2:  [93 %-98 %] 97 % (08/20 1610) Physical Exam  Constitutional: He is oriented to person, place, and time. He appears well-developed and well-nourished. No distress.  HENT:  Mouth/Throat: Oropharynx is clear and moist. No oropharyngeal exudate.  Cardiovascular: Normal rate, regular rhythm and normal heart sounds. Exam reveals no gallop and no friction rub.  No murmur heard.  Pulmonary/Chest: Effort normal and breath sounds normal. No respiratory distress. He has no wheezes.  Abdominal: Soft. Bowel sounds are normal. He exhibits no distension. There is no tenderness.  Ext: TTP at region of left superficial  wound/superficial clot of medial calf region. Left foot wrapped from surgery Neurological: He is alert and oriented to person, place, and time.  Skin: Skin is warm and dry. No rash noted. No erythema.  Psychiatric: He has a normal mood and affect. His behavior is normal.     Lab Results Recent Labs    06/06/20 0128 06/07/20 0127  WBC 17.5* 14.9*  HGB 8.6* 8.4*  HCT 27.1* 25.8*  NA 130* 130*  K 4.2 4.2  CL 93* 95*  CO2 27 28  BUN 21* 20  CREATININE 0.82 0.89   Liver Panel Recent Labs    06/06/20 0128 06/07/20 0127  PROT 5.7* 6.1*  ALBUMIN 1.9* 2.0*  AST 37 48*  ALT 46* 57*  ALKPHOS 183* 176*  BILITOT 0.7 0.5    Microbiology: reviewed Studies/Results: ECHOCARDIOGRAM COMPLETE  Result Date: 06/06/2020    ECHOCARDIOGRAM REPORT   Patient Name:   SAVEON PLANT Date of Exam: 06/06/2020 Medical Rec #:  [id number]     Height:       68.0 in Accession #:    [id number]    Weight:       180.6 lb Date of Birth:  1971/07/05    BSA:          1.957 m Patient Age:    48 years      BP:           168/86 mmHg Patient Gender: M  HR:           97 bpm. Exam Location:  Inpatient Procedure: 2D Echo Indications:    fever  History:        Patient has no prior history of Echocardiogram examinations.                 Risk Factors:Diabetes and Dyslipidemia.  Sonographer:    Tina West RDCS ReSheralyn Boatmang Phys: Lyndel Pleasure Chrissie Noa A HENSEL IMPRESSIONS  1. Global longitudinal strain is -22.2%. Left ventricular ejection fraction, by estimation, is 65 to 70%. The left ventricle has normal function. The left ventricle has no regional wall motion abnormalities. There is mild left ventricular hypertrophy. Left ventricular diastolic parameters were normal.  2. Right ventricular systolic function is normal. The right ventricular size is normal.  3. The mitral valve is normal in structure. Trivial mitral valve regurgitation.  4. The aortic valve is abnormal. Aortic valve regurgitation is not visualized. Mild aortic valve  sclerosis is present, with no evidence of aortic valve stenosis.  5. The inferior vena cava is dilated in size with <50% respiratory variability, suggesting right atrial pressure of 15 mmHg. FINDINGS  Left Ventricle: Global longitudinal strain is -22.2%. Left ventricular ejection fraction, by estimation, is 65 to 70%. The left ventricle has normal function. The left ventricle has no regional wall motion abnormalities. The left ventricular internal cavity size was normal in size. There is mild left ventricular hypertrophy. Left ventricular diastolic parameters were normal. Right Ventricle: The right ventricular size is normal. No increase in right ventricular wall thickness. Right ventricular systolic function is normal. Left Atrium: Left atrial size was normal in size. Right Atrium: Right atrial size was normal in size. Pericardium: There is no evidence of pericardial effusion. Mitral Valve: The mitral valve is normal in structure. Trivial mitral valve regurgitation. Tricuspid Valve: The tricuspid valve is normal in structure. Tricuspid valve regurgitation is trivial. Aortic Valve: The aortic valve is abnormal. Aortic valve regurgitation is not visualized. Mild aortic valve sclerosis is present, with no evidence of aortic valve stenosis. Pulmonic Valve: The pulmonic valve was grossly normal. Pulmonic valve regurgitation is not visualized. Aorta: The aortic root and ascending aorta are structurally normal, with no evidence of dilitation. Venous: The inferior vena cava is dilated in size with less than 50% respiratory variability, suggesting right atrial pressure of 15 mmHg. IAS/Shunts: No atrial level shunt detected by color flow Doppler.  LEFT VENTRICLE PLAX 2D LVIDd:         4.40 cm     Diastology LVIDs:         2.70 cm     LV e' lateral:   8.05 cm/s LV PW:         1.50 cm     LV E/e' lateral: 10.6 LV IVS:        1.10 cm     LV e' medial:    6.42 cm/s LVOT diam:     1.90 cm     LV E/e' medial:  13.3 LV SV:          63 LV SV Index:   32 LVOT Area:     2.84 cm  LV Volumes (MOD) LV vol d, MOD A2C: 80.0 ml LV vol d, MOD A4C: 71.5 ml LV vol s, MOD A2C: 27.0 ml LV vol s, MOD A4C: 26.9 ml LV SV MOD A2C:     53.0 ml LV SV MOD A4C:     71.5 ml LV SV MOD BP:  49.8 ml RIGHT VENTRICLE             IVC RV S prime:     13.80 cm/s  IVC diam: 2.30 cm TAPSE (M-mode): 2.1 cm LEFT ATRIUM             Index       RIGHT ATRIUM           Index LA diam:        4.20 cm 2.15 cm/m  RA Area:     10.20 cm LA Vol (A2C):   23.3 ml 11.91 ml/m RA Volume:   18.10 ml  9.25 ml/m LA Vol (A4C):   36.4 ml 18.60 ml/m LA Biplane Vol: 29.3 ml 14.97 ml/m  AORTIC VALVE LVOT Vmax:   142.00 cm/s LVOT Vmean:  84.800 cm/s LVOT VTI:    0.222 m  AORTA Ao Root diam: 3.30 cm Ao Asc diam:  3.25 cm MITRAL VALVE MV Area (PHT): 4.89 cm    SHUNTS MV Decel Time: 155 msec    Systemic VTI:  0.22 m MV E velocity: 85.70 cm/s  Systemic Diam: 1.90 cm MV A velocity: 60.40 cm/s MV E/A ratio:  1.42 Dietrich Pates MD Electronically signed by Dietrich Pates MD Signature Date/Time: 06/06/2020/5:30:26 PM    Final    VAS [place] LOWER EXTREMITY VENOUS (DVT)  Result Date: 06/05/2020  Lower Venous DVTStudy Indications: Pain, Swelling, and Erythema.  Risk Factors: Surgery 05/31/2020 - Ray Amputation of Left Great Toe with Negative Pressure Vac Placement. Limitations: Poor ultrasound/tissue interface and patient pain tolerance. Comparison Study: No prior studies. Performing Technologist: Chanda Busing RVT  Examination Guidelines: A complete evaluation includes B-mode imaging, spectral Doppler, color Doppler, and power Doppler as needed of all accessible portions of each vessel. Bilateral testing is considered an integral part of a complete examination. Limited examinations for reoccurring indications may be performed as noted. The reflux portion of the exam is performed with the patient in reverse Trendelenburg.  +-----+---------------+---------+-----------+----------+--------------+  RIGHTCompressibilityPhasicitySpontaneityPropertiesThrombus Aging +-----+---------------+---------+-----------+----------+--------------+ CFV  Full           Yes      Yes                                 +-----+---------------+---------+-----------+----------+--------------+   +---------+---------------+---------+-----------+----------+--------------+ LEFT     CompressibilityPhasicitySpontaneityPropertiesThrombus Aging +---------+---------------+---------+-----------+----------+--------------+ CFV      Full           Yes      Yes                                 +---------+---------------+---------+-----------+----------+--------------+ SFJ      Full                                                        +---------+---------------+---------+-----------+----------+--------------+ FV Prox  Full                                                        +---------+---------------+---------+-----------+----------+--------------+ FV Mid   Full                                                        +---------+---------------+---------+-----------+----------+--------------+  FV DistalFull                                                        +---------+---------------+---------+-----------+----------+--------------+ PFV      Full                                                        +---------+---------------+---------+-----------+----------+--------------+ POP      Full           Yes      Yes                                 +---------+---------------+---------+-----------+----------+--------------+ PTV      Full                                                        +---------+---------------+---------+-----------+----------+--------------+ PERO     Full                                                        +---------+---------------+---------+-----------+----------+--------------+ GSV      None                                          Acute          +---------+---------------+---------+-----------+----------+--------------+ Superficial thrombosis noted in the GSV originates in the mid thigh and extends into the distal calf.    Summary: RIGHT: - No evidence of common femoral vein obstruction.  LEFT: - Findings consistent with acute superficial vein thrombosis involving the left great saphenous vein. - There is no evidence of deep vein thrombosis in the lower extremity.  - No cystic structure found in the popliteal fossa.  *See table(s) above for measurements and observations. Electronically signed by Gretta Beganodd Early MD on 06/05/2020 at 5:04:34 PM.    Final      Assessment/Plan: Group b streptococcal bacteremia = will keep on ceftriaxone 2gm IV daily, while hospitalized ( especially if he is to be discharged later today). Would then discharge on 7 additional days of cephalexin 500mg  QID to finish out 14 day course  Leukocytosis = improving but still would recommend another day of being afebrile, and trend down of leukocytosis. Currently at 14K down from 17K   Gangrenous/osteo of left great toe = s/p ray amputation. Defer to dr Randie Heinzcain for management. Has been on antibiotics for post amputation.  Left calf superficial venous thrombus = due to location on leg suspect it may ulceration/spontaneously drain. Recommend to give him wound care instructions for home  Anemia = unclear if his hgb is trended down solely from amputation. Would recommend to watch 24hr since starting anticoagulation.  Will sign off  [facility] Cell: [phone] Pager: [phone]  06/07/2020, 11:28 AM

## 2020-06-07 NOTE — TOC Transition Note (Signed)
Transition of Care [facility]) - CM/SW Discharge Note   Patient Details  Name: Sean Carter MRN: [id number] Date of Birth: Sep 18, 1971  Transition of Care [facility]) CM/SW Contact:  Lorri Frederick, LCSW Phone Number: 06/07/2020, 2:56 PM   Clinical Narrative:   Pt cleared for discharge by teaching service.  Pt will not require wound vac, has follow up with surgeon.  Pt girlfriend will be picking him up and will train with RN on the floor to change dressings.  CSW discussed this with pt today, as girlfriend was also supposed to come and be trained yesterday but did not.  Rolling walker already delivered and in room.  Family practice appt is scheduled for 8/24.  HH arranged through LOG with [facility]. Pt aware.  LOG approved by Timothy Lasso and LOG form filled out and given to [place].  No other needs identified.     Final next level of care: Home w Home Health Services Barriers to Discharge: Barriers Resolved   Patient Goals and CMS Choice Patient states their goals for this hospitalization and ongoing recovery are:: discharge home CMS Medicare.gov Compare Post Acute Care list provided to::  (uninsured patient-will attempt charity care [facility])    Discharge Placement                    Patient and family notified of of transfer: 06/07/20  Discharge Plan and Services                DME Arranged: Dan Humphreys rolling DME Agency: [employer] Date DME Agency Contacted: 06/03/20 Time DME Agency Contacted: 1330 Representative spoke with at DME Agency: Ian Malkin HH Arranged: PT, OT HH Agency: [facility] Date [facility] Agency Contacted: 06/07/20 Time HH Agency Contacted: 1400 Representative spoke with at [facility] Agency: Kandee Keen  Social Determinants of Health (SDOH) Interventions     Readmission Risk Interventions No flowsheet data found.

## 2020-06-07 NOTE — Discharge Instructions (Signed)
You were hospitalized at [facility] due to an infection of your left great toe.  We expect this is from uncontrolled diabetes which improved after being placed on Lantus and insulin while in the hospital.  We are so glad you are feeling better.  Be sure to follow-up with your regularly scheduled appointments.  Please also be sure to follow-up with our clinic on Tuesday 06/11/20 at 8:30 AM [facility], [address] [place] [place], [place], [address]), please arrive by 8:20 PM. Thank you for allowing [place] to take care of you.   You will also have an appointment with Vascular Surgery on Tuesday 06/25/2020 8:20 AM (Please arrive by 8:05 AM).  We have prescribed the following medications to help treat your medical conditions: --Metformin  twice daily for diabetes --Lantus (insulin) 40 Units daily for diabetes --Atorvastatin  once daily for cholesterol --Cephalexin  four times daily for GBS bacteremia (bacteria in your blood)   We also recommend the following: --Please take your temperature for the next few days. If you start to notice a fever, please either return to the hospital or contact the clinic office and set-up an appointment --For your calf wound, keep it covered with a clean dressing. You can put Vaseline or Bacitracin on the dressing if you would like. If you start to notice increasing redness, tenderness, warmth, and higher levels of pain, please make an appointment or come back to the hospital.     Take care, [facility] family medicine team   _______________________________________________________________________________________________________________________________________ Three Tips for Diabetes Control: 1) Don't skip meals. Eat every 3-4 hours. 2) Keep carbohydrates consistent throughout the day. You want to eat 45-60 grams of carbohydrate per meal, 15-30 grams of carbohydrate per snack. 3) Balance meals using protein/fat and non-starchy vegetables/whole grains.  Remember, fiber and protein/fat slow down the digestion of carbohydrates.  If you have questions, you can call the dietitian at [phone]   Carbohydrate Counting For People With Diabetes  Foods with carbohydrates make your blood glucose level go up. Learning how to count carbohydrates can help you control your blood glucose levels. First, identify the foods you eat that contain carbohydrates. Then, using the Foods with Carbohydrates chart, determine about how much carbohydrates are in your meals and snacks. Make sure you are eating foods with fiber, protein, and healthy fat along with your carbohydrate foods. Foods with Carbohydrates The following table shows carbohydrate foods that have about 15 grams of carbohydrate each. Using measuring cups, spoons, or a food scale when you first begin learning about carbohydrate counting can help you learn about the portion sizes you typically eat. The following foods have 15 grams carbohydrate each:  Grains . 1 slice bread (1 ounce)  . 1 small tortilla (6-inch size)  .  large bagel (1 ounce)  . 1/3 cup pasta or rice (cooked)  .  hamburger or hot dog bun ( ounce)  .  cup cooked cereal  .  to  cup ready-to-eat cereal  . 2 taco shells (5-inch size) Fruit . 1 small fresh fruit ( to 1 cup)  .  medium banana  . 17 small grapes (3 ounces)  . 1 cup melon or berries  .  cup canned or frozen fruit  . 2 tablespoons dried fruit (blueberries, cherries, cranberries, raisins)  .  cup unsweetened fruit juice  Starchy Vegetables .  cup cooked beans, peas, corn, potatoes/sweet potatoes  .  large baked potato (3 ounces)  . 1 cup acorn or butternut squash  Snack Foods . 3 to 6 crackers  . 8 potato chips or 13 tortilla chips ( ounce to 1 ounce)  . 3 cups popped popcorn  Dairy . 3/4 cup (6 ounces) nonfat plain yogurt, or yogurt with sugar-free sweetener  . 1 cup milk  . 1 cup plain rice, soy, coconut or flavored almond milk Sweets and  Desserts .  cup ice cream or frozen yogurt  . 1 tablespoon jam, jelly, pancake syrup, table sugar, or honey  . 2 tablespoons light pancake syrup  . 1 inch square of frosted cake or 2 inch square of unfrosted cake  . 2 small cookies (2/3 ounce each) or  large cookie  Sometimes you'll have to estimate carbohydrate amounts if you don't know the exact recipe. One cup of mixed foods like soups can have 1 to 2 carbohydrate servings, while some casseroles might have 2 or more servings of carbohydrate. Foods that have less than 20 calories in each serving can be counted as "free" foods. Count 1 cup raw vegetables, or  cup cooked non-starchy vegetables as "free" foods. If you eat 3 or more servings at one meal, then count them as 1 carbohydrate serving.  Foods without Carbohydrates  Not all foods contain carbohydrates. Meat, some dairy, fats, non-starchy vegetables, and many beverages don't contain carbohydrate. So when you count carbohydrates, you can generally exclude chicken, pork, beef, fish, seafood, eggs, tofu, cheese, butter, sour cream, avocado, nuts, seeds, olives, mayonnaise, water, black coffee, unsweetened tea, and zero-calorie drinks. Vegetables with no or low carbohydrate include green beans, cauliflower, tomatoes, and onions. How much carbohydrate should I eat at each meal?  Carbohydrate counting can help you plan your meals and manage your weight. Following are some starting points for carbohydrate intake at each meal. Work with your registered dietitian nutritionist to find the best range that works for your blood glucose and weight.   To Lose Weight To Maintain Weight  Women 2 - 3 carb servings 3 - 4 carb servings  Men 3 - 4 carb servings 4 - 5 carb servings  Checking your blood glucose after meals will help you know if you need to adjust the timing, type, or number of carbohydrate servings in your meal plan. Achieve and keep a healthy body weight by balancing your food intake and physical  activity.  Tips How should I plan my meals?  Plan for half the food on your plate to include non-starchy vegetables, like salad greens, broccoli, or carrots. Try to eat 3 to 5 servings of non-starchy vegetables every day. Have a protein food at each meal. Protein foods include chicken, fish, meat, eggs, or beans (note that beans contain carbohydrate). These two food groups (non-starchy vegetables and proteins) are low in carbohydrate. If you fill up your plate with these foods, you will eat less carbohydrate but still fill up your stomach. Try to limit your carbohydrate portion to  of the plate.  What fats are healthiest to eat?  Diabetes increases risk for heart disease. To help protect your heart, eat more healthy fats, such as olive oil, nuts, and avocado. Eat less saturated fats like butter, cream, and high-fat meats, like bacon and sausage. Avoid trans fats, which are in all foods that list "partially hydrogenated oil" as an ingredient. What should I drink?  Choose drinks that are not sweetened with sugar. The healthiest choices are water, carbonated or seltzer waters, and tea and coffee without added sugars.  Sweet drinks will make your blood glucose  go up very quickly. One serving of soda or energy drink is  cup. It is best to drink these beverages only if your blood glucose is low.  Artificially sweetened, or diet drinks, typically do not increase your blood glucose if they have zero calories in them. Read labels of beverages, as some diet drinks do have carbohydrate and will raise your blood glucose. Label Reading Tips Read Nutrition Facts labels to find out how many grams of carbohydrate are in a food you want to eat. Don't forget: sometimes serving sizes on the label aren't the same as how much food you are going to eat, so you may need to calculate how much carbohydrate is in the food you are serving yourself.   Carbohydrate Counting for People with Diabetes Sample 1-Day Menu  Breakfast   cup yogurt, low fat, low sugar (1 carbohydrate serving)   cup cereal, ready-to-eat, unsweetened (1 carbohydrate serving)  1 cup strawberries (1 carbohydrate serving)   cup almonds ( carbohydrate serving)  Lunch 1, 5 ounce can chunk light tuna  2 ounces cheese, low fat cheddar  6 whole wheat crackers (1 carbohydrate serving)  1 small apple (1 carbohydrate servings)   cup carrots ( carbohydrate serving)   cup snap peas  1 cup 1% milk (1 carbohydrate serving)   Evening Meal Stir fry made with: 3 ounces chicken  1 cup brown rice (3 carbohydrate servings)   cup broccoli ( carbohydrate serving)   cup green beans   cup onions  1 tablespoon olive oil  2 tablespoons teriyaki sauce ( carbohydrate serving)  Evening Snack 1 extra small banana (1 carbohydrate serving)  1 tablespoon peanut butter   Carbohydrate Counting for People with Diabetes Vegan Sample 1-Day Menu  Breakfast 1 cup cooked oatmeal (2 carbohydrate servings)   cup blueberries (1 carbohydrate serving)  2 tablespoons flaxseeds  1 cup soymilk fortified with calcium and vitamin D  1 cup coffee  Lunch 2 slices whole wheat bread (2 carbohydrate servings)   cup baked tofu   cup lettuce  2 slices tomato  2 slices avocado   cup baby carrots ( carbohydrate serving)  1 orange (1 carbohydrate serving)  1 cup soymilk fortified with calcium and vitamin D   Evening Meal Burrito made with: 1 6-inch corn tortilla (1 carbohydrate serving)  1 cup refried vegetarian beans (2 carbohydrate servings)   cup chopped tomatoes   cup lettuce   cup salsa  1/3 cup brown rice (1 carbohydrate serving)  1 tablespoon olive oil for rice   cup zucchini   Evening Snack 6 small whole grain crackers (1 carbohydrate serving)  2 apricots ( carbohydrate serving)   cup unsalted peanuts ( carbohydrate serving)    Carbohydrate Counting for People with Diabetes Vegetarian (Lacto-Ovo) Sample 1-Day Menu  Breakfast 1 cup cooked oatmeal (2  carbohydrate servings)   cup blueberries (1 carbohydrate serving)  2 tablespoons flaxseeds  1 egg  1 cup 1% milk (1 carbohydrate serving)  1 cup coffee  Lunch 2 slices whole wheat bread (2 carbohydrate servings)  2 ounces low-fat cheese   cup lettuce  2 slices tomato  2 slices avocado   cup baby carrots ( carbohydrate serving)  1 orange (1 carbohydrate serving)  1 cup unsweetened tea  Evening Meal Burrito made with: 1 6-inch corn tortilla (1 carbohydrate serving)   cup refried vegetarian beans (1 carbohydrate serving)   cup tomatoes   cup lettuce   cup salsa  1/3 cup brown rice (1  carbohydrate serving)  1 tablespoon olive oil for rice   cup zucchini  1 cup 1% milk (1 carbohydrate serving)  Evening Snack 6 small whole grain crackers (1 carbohydrate serving)  2 apricots ( carbohydrate serving)   cup unsalted peanuts ( carbohydrate serving)    Copyright 2020  Academy of [employer] and Dietetics. All rights reserved.  Using Nutrition Labels: Carbohydrate  . Serving Size  . Look at the serving size. All the information on the label is based on this portion. Jolyne Loa Per Container  . The number of servings contained in the package. . Guidelines for Carbohydrate  . Look at the total grams of carbohydrate in the serving size.  . 1 carbohydrate choice = 15 grams of carbohydrate. Range of Carbohydrate Grams Per Choice  Carbohydrate Grams/Choice Carbohydrate Choices  6-10   11-20 1  21-25 1  26-35 2  36-40 2  41-50 3  51-55 3  56-65 4  66-70 4  71-80 5    Copyright 2020  Academy of [employer] and Dietetics. All rights reserved. Information on my medicine - XARELTO (rivaroxaban)  This medication education was reviewed with me or my healthcare representative as part of my discharge preparation.  The pharmacist that spoke with me during my hospital stay was:  Ike Bene, RPH  WHY WAS XARELTO PRESCRIBED FOR YOU? Xarelto was prescribed to treat  blood clots that may have been found in the veins of your legs (deep vein thrombosis) or in your lungs (pulmonary embolism) and to reduce the risk of them occurring again.  What do you need to know about Xarelto? The starting dose is one 15 mg tablet taken TWICE daily with food for the FIRST 21 DAYS then on 9/10   the dose is changed to one 20 mg tablet taken ONCE A DAY with your evening meal.  DO NOT stop taking Xarelto without talking to the health care provider who prescribed the medication.  Refill your prescription for 20 mg tablets before you run out.  After discharge, you should have regular check-up appointments with your healthcare provider that is prescribing your Xarelto.  In the future your dose may need to be changed if your kidney function changes by a significant amount.  What do you do if you miss a dose? If you are taking Xarelto TWICE DAILY and you miss a dose, take it as soon as you remember. You may take two 15 mg tablets (total 30 mg) at the same time then resume your regularly scheduled 15 mg twice daily the next day.  If you are taking Xarelto ONCE DAILY and you miss a dose, take it as soon as you remember on the same day then continue your regularly scheduled once daily regimen the next day. Do not take two doses of Xarelto at the same time.   Important Safety Information Xarelto is a blood thinner medicine that can cause bleeding. You should call your healthcare provider right away if you experience any of the following: ? Bleeding from an injury or your nose that does not stop. ? Unusual colored urine (red or dark brown) or unusual colored stools (red or black). ? Unusual bruising for unknown reasons. ? A serious fall or if you hit your head (even if there is no bleeding).  Some medicines may interact with Xarelto and might increase your risk of bleeding while on Xarelto. To help avoid this, consult your healthcare provider or pharmacist prior to using any new  prescription or  non-prescription medications, including herbals, vitamins, non-steroidal anti-inflammatory drugs (NSAIDs) and supplements.  This website has more information on Xarelto: [email or web address].

## 2020-06-07 NOTE — Progress Notes (Signed)
Family Medicine Teaching Service Daily Progress Note Intern Pager: [phone]  Patient name: Sean Carter Medical record number: [id number] Date of birth: 12/09/1970 Age: 49 y.o. Gender: male  Primary Care Provider: Patient, No Pcp Per Consultants: Ortho, vascular surgery, ID Code Status: Full  Pt Overview and Major Events to Date:  Admitted 8/12  Assessment and Plan: Sean Carter a 48 y.o.malepresenting with 2 weeks of progressively worsening left great toe pain, now with color changes,patientalso experiencing flulike symptoms. PMH is significant fortype 2 diabetes and seasonal allergies.   Unexplained Fever Pt has also had new onset of fever starting 8/17 with a Tmax overnight of 102.4. The pt had his COVID vaccination on 8/16.  He has continued to have fevers with new concern for fever due to infection--abx were broadened yesterday with negative blood and urine cultures and normal CXR (8/17). Recent WBC trend 18.3(8/17)>15.5>17.5(8/19). No other notable possible sources for infection. Blood and urine cultures (8/17) negative, pt on abx when collected. ID consulted and repeat blood cultures were ordered and are pending. -Continue daily CBC monitoring (WBC 8/17 18.3) -Vanc and Zosyn started 8/18. -Consider a possible "abx rest" to see if fevers related to abx -Blood culture pending -ID consulted, appreciate recommendations.   Superficial Vein Thrombosis Involving Left Great Saphenous Vein Pt is POD#7 after a left ray amputation. He has continued to have mild redness of his calf during this time, but on 8/18 there was notable focal change that is concerning for ischemia or infection, vascular surgery was consulted. VAS [place] LE 8/18: Left leg consistent with acute superficial vein thrombosis involving L great saphenous vein (originates in mid-thigh and extends into the distal calf). No evidence of DVT in the left lower extremity. Left tib-fib xray 8/18 was negative for acute osseous  findings and gas. -May need to consider need for MRI if worsening or no resolution.ECHO (8/19) noted LVEF of 65-70% with mild LV hypertrophy, mild aortic valve stenosis,  -Vascular surgery consulted and following pt -Start use of warm compresses  -Discharge meds include Xarelto for 45 days due to extensive thrombosis.   GBS Bacteremia, improving  Gas gangrene ofleftfoot,first digit s/p left great toe amputation, resolved POD #7. WBC  Lab 14.9, afebrile overnight with Tmax of 99.7. The patient received his COVID vaccination 8/16, though the fevers seem to now be out of range for this to be a likely cause. Antibiotics narrowed to Penicillin to cover for Group B strep.  S/p IV cefepime, vancomycin, Flagyl (8/12-8/13). Patient was started on oral Amoxicillin 500 BID to be continued until total of 10 days post amputation. Fevers began on 8/16 and abx were broadened to Vanc and Zosyn on 8/18. Zosyn was changed to Ceftriaxone on 8/19. Patient amputation site no long has wound vac, patient has dressing on. -Ortho consulted, appreciate recommendations. LW vascular US (8/14) showed right and left ABI wnl. -Vascular consulted, appreciate recommendations. Recommended Darco shoe. -Stopped IV PCN G (8/13-8/16), stopped Amoxicillin 500 TID (8/16-8/18). Began Vanc and Zosyn (8/18-8-19). Per ID recs changed to Vanc and Ceftriaxone (8/19-)  -Daily CBC, BMP -Vitals per floor routine -PT/OT eval and treat--recommend HHPT/OT, rolling walker, wheelchair with cushion, 3in1, heel-walker boot.  -COVID-19 Moderna Vaccine--completed 8/16   T2DM: chronic, uncontrolled CBG overnight 272-307. A1c 11.3 indicating poor control. Pt is not on any diabetic medications at home. Discharge diabetic meds: Metformin 500mg  BID, Lantus 35U daily -Carb modified diet  -Daily CBG's -Moderate sliding scale insulin -Lantus 35U daily, Novolog 5U daily with meals (if  eating at least 50% of meal) -We will plan to discharge home with  diabetes regimen and close follow-up outpatient   Protein Calorie Malnutrition Albumin 2.0, Total protein 5.2 likely secondary to poor nutrition.   -CMP -Nutrition consult  Hyperlipidemia: Chronic, stable Lipid panel 8/12: TG 247, HDL <10, VLDL 49, LDL not calculated, total cholesterol 125. ASCVD risk 24.2%. - Continue Atorvastatin to 40mg  daily - Counseled on dietary modifications  HTN: Stable BP 168/86 this morning. Not on home anti-hypertensives. Losartan 12.5mg  started 8/14. -Continue to monitor BPs -Continue Losartan 12.5mg    Normocytic Anemia: Stable Hemoglobin 11.5>10.2>9.5>9>9>8.1 likely in setting of recent surgery. Hb threshold is 7. No known hx of CAD.  - Continue to monitor - Daily CBC  Hyponatremia: improving Sodium 131>130>127>129>128>131>130.   -Daily BMP - Continue to monitor, better BG control  Hypokalemia: Acute, resolved K 3.6, 3.6 > 4.1>3.9>4.2>4.2 -Daily BMP -Replete as necessary  FEN/GI: Carb modified PPx: Heparin subcutaneous  Disposition: Plan to d/c with home PT/OT and wound care. Potential discharge today or tomorrow.  Subjective:  Patient reports that he is doing well today, he did not have fevers overnight and feels better. Patient reports that he is very ready to go home.    Objective: Temp:  [98.7 F (37.1 C)-99.7 F (37.6 C)] 99.7 F (37.6 C) (08/20 0023) Pulse Rate:  [78-89] 89 (08/20 0023) Resp:  [18] 18 (08/19 0721) BP: (124-168)/(63-86) 151/78 (08/20 0023) SpO2:  [93 %-98 %] 93 % (08/20 0023)    Physical Exam: General: Alert, cooperative, no acute distress, sitting upright in bed. Cardio: RRR, systolic murmur appreciated, no r/g Pulm: CTAB, Normal respiratory effort Abdomen: Bowel sounds normal. Abdomen soft and non-tender.  Extremities: Dressing over left toe amputation c/d/i, no wound vac. Tender on palpation. 2+ pitting edema of LE with left calf tenderness to palpation. Purple and yellow discolorations noted around  focal area of discoloration on the medial left calf  and minimal blood (as shown in picture).    Left lower extremity 8/20  Left lower extremity 8/20   L lower extremity 8/19   L lower extremity 8/18    Laboratory: Recent Labs  Lab 06/05/20 1216 06/06/20 0128 06/07/20 0127  WBC 15.5* 17.5* 14.9*  HGB 8.5* 8.6* 8.4*  HCT 26.6* 27.1* 25.8*  PLT 481* 478* 526*   Recent Labs  Lab 06/04/20 0056 06/06/20 0128 06/07/20 0127  NA 131* 130* 130*  K 3.9 4.2 4.2  CL 93* 93* 95*  CO2 29 27 28   BUN 15 21* 20  CREATININE 0.83 0.82 0.89  CALCIUM 7.8* 8.0* 8.1*  PROT 5.4* 5.7* 6.1*  BILITOT 0.5 0.7 0.5  ALKPHOS 159* 183* 176*  ALT 45* 46* 57*  AST 40 37 48*  GLUCOSE 324* 264* 304*    Imaging/Diagnostic Tests: DG Tibia/Fibula Left  Result Date: 06/05/2020 CLINICAL DATA:  Possible infection after toe surgery EXAM: LEFT TIBIA AND FIBULA - 2 VIEW COMPARISON:  None. FINDINGS: Subcutaneous reticulation diffusely. No soft tissue gas, opaque foreign body, or erosion. Heel spurs and diffuse arterial calcification. IMPRESSION: 1. Generalized subcutaneous reticulation. No soft tissue gas or acute osseous finding. 2. Premature arterial calcification. Electronically Signed   By: Marnee SpringJonathon  Watts M.D.   On: 06/05/2020 10:53   ECHOCARDIOGRAM COMPLETE  Result Date: 06/06/2020    ECHOCARDIOGRAM REPORT   Patient Name:   Philomena DohenySCOTT T Sweetin Date of Exam: 06/06/2020 Medical Rec #:  [id number]     Height:       68.0 in Accession #:  [id number]    Weight:       180.6 lb Date of Birth:  [birth year]    BSA:          1.957 m Patient Age:    48 years      BP:           168/86 mmHg Patient Gender: M             HR:           97 bpm. Exam Location:  Inpatient Procedure: 2D Echo Indications:    fever  History:        Patient has no prior history of Echocardiogram examinations.                 Risk Factors:Diabetes and Dyslipidemia.  Sonographer:    Sheralyn Boatman RDCS Referring Phys: Lyndel Pleasure Chrissie Noa A HENSEL IMPRESSIONS  1.  Global longitudinal strain is -22.2%. Left ventricular ejection fraction, by estimation, is 65 to 70%. The left ventricle has normal function. The left ventricle has no regional wall motion abnormalities. There is mild left ventricular hypertrophy. Left ventricular diastolic parameters were normal.  2. Right ventricular systolic function is normal. The right ventricular size is normal.  3. The mitral valve is normal in structure. Trivial mitral valve regurgitation.  4. The aortic valve is abnormal. Aortic valve regurgitation is not visualized. Mild aortic valve sclerosis is present, with no evidence of aortic valve stenosis.  5. The inferior vena cava is dilated in size with <50% respiratory variability, suggesting right atrial pressure of 15 mmHg. FINDINGS  Left Ventricle: Global longitudinal strain is -22.2%. Left ventricular ejection fraction, by estimation, is 65 to 70%. The left ventricle has normal function. The left ventricle has no regional wall motion abnormalities. The left ventricular internal cavity size was normal in size. There is mild left ventricular hypertrophy. Left ventricular diastolic parameters were normal. Right Ventricle: The right ventricular size is normal. No increase in right ventricular wall thickness. Right ventricular systolic function is normal. Left Atrium: Left atrial size was normal in size. Right Atrium: Right atrial size was normal in size. Pericardium: There is no evidence of pericardial effusion. Mitral Valve: The mitral valve is normal in structure. Trivial mitral valve regurgitation. Tricuspid Valve: The tricuspid valve is normal in structure. Tricuspid valve regurgitation is trivial. Aortic Valve: The aortic valve is abnormal. Aortic valve regurgitation is not visualized. Mild aortic valve sclerosis is present, with no evidence of aortic valve stenosis. Pulmonic Valve: The pulmonic valve was grossly normal. Pulmonic valve regurgitation is not visualized. Aorta: The aortic  root and ascending aorta are structurally normal, with no evidence of dilitation. Venous: The inferior vena cava is dilated in size with less than 50% respiratory variability, suggesting right atrial pressure of 15 mmHg. IAS/Shunts: No atrial level shunt detected by color flow Doppler.  LEFT VENTRICLE PLAX 2D LVIDd:         4.40 cm     Diastology LVIDs:         2.70 cm     LV e' lateral:   8.05 cm/s LV PW:         1.50 cm     LV E/e' lateral: 10.6 LV IVS:        1.10 cm     LV e' medial:    6.42 cm/s LVOT diam:     1.90 cm     LV E/e' medial:  13.3 LV SV:  63 LV SV Index:   32 LVOT Area:     2.84 cm  LV Volumes (MOD) LV vol d, MOD A2C: 80.0 ml LV vol d, MOD A4C: 71.5 ml LV vol s, MOD A2C: 27.0 ml LV vol s, MOD A4C: 26.9 ml LV SV MOD A2C:     53.0 ml LV SV MOD A4C:     71.5 ml LV SV MOD BP:      49.8 ml RIGHT VENTRICLE             IVC RV S prime:     13.80 cm/s  IVC diam: 2.30 cm TAPSE (M-mode): 2.1 cm LEFT ATRIUM             Index       RIGHT ATRIUM           Index LA diam:        4.20 cm 2.15 cm/m  RA Area:     10.20 cm LA Vol (A2C):   23.3 ml 11.91 ml/m RA Volume:   18.10 ml  9.25 ml/m LA Vol (A4C):   36.4 ml 18.60 ml/m LA Biplane Vol: 29.3 ml 14.97 ml/m  AORTIC VALVE LVOT Vmax:   142.00 cm/s LVOT Vmean:  84.800 cm/s LVOT VTI:    0.222 m  AORTA Ao Root diam: 3.30 cm Ao Asc diam:  3.25 cm MITRAL VALVE MV Area (PHT): 4.89 cm    SHUNTS MV Decel Time: 155 msec    Systemic VTI:  0.22 m MV E velocity: 85.70 cm/s  Systemic Diam: 1.90 cm MV A velocity: 60.40 cm/s MV E/A ratio:  1.42 Dietrich Pates MD Electronically signed by Dietrich Pates MD Signature Date/Time: 06/06/2020/5:30:26 PM    Final    VAS [place] LOWER EXTREMITY VENOUS (DVT)  Result Date: 06/05/2020  Lower Venous DVTStudy Indications: Pain, Swelling, and Erythema.  Risk Factors: Surgery 05/31/2020 - Ray Amputation of Left Great Toe with Negative Pressure Vac Placement. Limitations: Poor ultrasound/tissue interface and patient pain tolerance. Comparison  Study: No prior studies. Performing Technologist: Chanda Busing RVT  Examination Guidelines: A complete evaluation includes B-mode imaging, spectral Doppler, color Doppler, and power Doppler as needed of all accessible portions of each vessel. Bilateral testing is considered an integral part of a complete examination. Limited examinations for reoccurring indications may be performed as noted. The reflux portion of the exam is performed with the patient in reverse Trendelenburg.  +-----+---------------+---------+-----------+----------+--------------+ RIGHTCompressibilityPhasicitySpontaneityPropertiesThrombus Aging +-----+---------------+---------+-----------+----------+--------------+ CFV  Full           Yes      Yes                                 +-----+---------------+---------+-----------+----------+--------------+   +---------+---------------+---------+-----------+----------+--------------+ LEFT     CompressibilityPhasicitySpontaneityPropertiesThrombus Aging +---------+---------------+---------+-----------+----------+--------------+ CFV      Full           Yes      Yes                                 +---------+---------------+---------+-----------+----------+--------------+ SFJ      Full                                                        +---------+---------------+---------+-----------+----------+--------------+  FV Prox  Full                                                        +---------+---------------+---------+-----------+----------+--------------+ FV Mid   Full                                                        +---------+---------------+---------+-----------+----------+--------------+ FV DistalFull                                                        +---------+---------------+---------+-----------+----------+--------------+ PFV      Full                                                         +---------+---------------+---------+-----------+----------+--------------+ POP      Full           Yes      Yes                                 +---------+---------------+---------+-----------+----------+--------------+ PTV      Full                                                        +---------+---------------+---------+-----------+----------+--------------+ PERO     Full                                                        +---------+---------------+---------+-----------+----------+--------------+ GSV      None                                         Acute          +---------+---------------+---------+-----------+----------+--------------+ Superficial thrombosis noted in the GSV originates in the mid thigh and extends into the distal calf.    Summary: RIGHT: - No evidence of common femoral vein obstruction.  LEFT: - Findings consistent with acute superficial vein thrombosis involving the left great saphenous vein. - There is no evidence of deep vein thrombosis in the lower extremity.  - No cystic structure found in the popliteal fossa.  *See table(s) above for measurements and observations. Electronically signed by Gretta Began MD on 06/05/2020 at 5:04:34 PM.    Final     Evelena Leyden, DO 06/07/2020, 5:52 AM PGY-1, [facility] FPTS Intern pager: [phone], text pages welcome

## 2020-06-07 NOTE — Progress Notes (Signed)
Physical Therapy Treatment Patient Details Name: ATWELL MCDANEL MRN: [id number] DOB: 04-Nov-1970 Today's Date: 06/07/2020    History of Present Illness ALDAHIR LITAKER is a 49 y.o. male presenting with 2 weeks of progressively worsening left great toe pain, now with color changes, patient also experiencing flulike symptoms. PMH is significant for type 2 diabetes and seasonal allergies.Pt underwent L ray amputation    PT Comments    Patient was laying in hospital bed at the start of the session and stated that he had just went for a walk earlier this morning. When asked why he was declining the wheelchair, he said he does not feel like he needs it and is planning on staying in his house for the next 4-6 weeks until his foot heals and that his girlfriend will help with all needs outside of the home. He was Mod I with supine <> sit transfer. He is not able to put the darco shoe on by himself and needs total A donning and doffing the shoe. Sit <> stand with Min guard assist and use of RW. Pt ambulated 10' to bathroom, then 30' in the hallway with RW requiring min guard assistance for safety. Needed 1 standing rest break because his leg felt "tight". He was left laying in his bed with all needs within reach.   Follow Up Recommendations  Home health PT;Supervision for mobility/OOB     Equipment Recommendations  Rolling walker with 5" wheels;Wheelchair (measurements PT)    Recommendations for Other Services       Precautions / Restrictions Precautions Precautions: Fall;Other (comment) Precaution Comments: now heel WBAT in Darco Restrictions Weight Bearing Restrictions: No Other Position/Activity Restrictions: heel WBAT in darco L LE    Mobility  Bed Mobility Overal bed mobility: Modified Independent                Transfers Overall transfer level: Needs assistance Equipment used: Rolling walker (2 wheeled) Transfers: Sit to/from Stand Sit to Stand: Min guard         General  transfer comment: min guard with min cues to maintain heel WB in Darco  Ambulation/Gait Ambulation/Gait assistance: Min guard Gait Distance (Feet): 100 Feet Assistive device: Rolling walker (2 wheeled) Gait Pattern/deviations: Step-to pattern;Decreased step length - right;Decreased stance time - left;Decreased stride length;Decreased weight shift to left Gait velocity: slow   General Gait Details: able to ambulate with RW and slow effortful pattern but much safer now that he's able to weight bear on L heel   Stairs             Wheelchair Mobility    Modified Rankin (Stroke Patients Only)       Balance Overall balance assessment: Needs assistance Sitting-balance support: No upper extremity supported;Feet supported Sitting balance-Leahy Scale: Good     Standing balance support: Bilateral upper extremity supported Standing balance-Leahy Scale: Fair Standing balance comment: reliant on BUE support                            Cognition Arousal/Alertness: Awake/alert Behavior During Therapy: WFL for tasks assessed/performed Overall Cognitive Status: Within Functional Limits for tasks assessed                                 General Comments: very pleasant and cooperative but still resistant to idea of [facility]      Exercises  General Comments        Pertinent Vitals/Pain Pain Assessment: No/denies pain Pain Intervention(s): Monitored during session    Home Living                      Prior Function            PT Goals (current goals can now be found in the care plan section) Acute Rehab PT Goals Patient Stated Goal: be able to manage on my own PT Goal Formulation: With patient Time For Goal Achievement: 06/15/20 Potential to Achieve Goals: Good Progress towards PT goals: Progressing toward goals    Frequency    Min 3X/week      PT Plan      Co-evaluation              AM-PAC PT "6 Clicks" Mobility    Outcome Measure  Help needed turning from your back to your side while in a flat bed without using bedrails?: None Help needed moving from lying on your back to sitting on the side of a flat bed without using bedrails?: None Help needed moving to and from a bed to a chair (including a wheelchair)?: A Little Help needed standing up from a chair using your arms (e.g., wheelchair or bedside chair)?: A Little Help needed to walk in hospital room?: A Little Help needed climbing 3-5 steps with a railing? : A Lot 6 Click Score: 19    End of Session Equipment Utilized During Treatment: Gait belt Activity Tolerance: Patient tolerated treatment well Patient left: in bed;with call bell/phone within reach Nurse Communication: Mobility status PT Visit Diagnosis: Unsteadiness on feet (R26.81);Other abnormalities of gait and mobility (R26.89);Muscle weakness (generalized) (M62.81);Difficulty in walking, not elsewhere classified (R26.2);Pain Pain - Right/Left: Left Pain - part of body: Ankle and joints of foot     Time:  -     Charges:                        Elisha Ponder, SPT, ATC

## 2020-06-07 NOTE — Telephone Encounter (Signed)
Patient scheduled with Dr. Daiva Eves as Sean Nora, NP did not have anything. Sean Carter

## 2020-06-07 NOTE — Progress Notes (Signed)
Discharge instructions reviewed with patient and patients s/o. All questions answered and given patient time to process events and plan for future.   Follow up instructions reviewed and calender reviewed with patient as well.  Home care, Medication planning, and follow up care all covered with discharge instructions.   Took time to teach s/o how to properly change patients dressing for home care, and patients s/o provided proper demonstration of wound dressing with my guidance.   All of patients belongings collected per patient and secured in personal belongings bag.   Patient provided transport to [facility] tower entrance for patient discharge.

## 2020-06-07 NOTE — Telephone Encounter (Signed)
-----   Message from Judyann Munson, MD sent at 06/07/2020  4:40 PM EDT ----- Can he see stephanie in 10 d

## 2020-06-07 NOTE — Progress Notes (Addendum)
  Progress Note    06/07/2020 7:36 AM 7 Days Post-Op  Subjective: no major complaints.says he feels better now that he hasnt had a fever. Left leg still feels tight   Vitals:   06/06/20 2106 06/07/20 0023  BP: 124/63 (!) 151/78  Pulse: 78 89  Resp:    Temp: 98.7 F (37.1 C) 99.7 F (37.6 C)  SpO2: 98% 93%   Physical Exam: Cardiac: regular rate and rhythm Lungs: non labored Extremities: left foot dressings removed. Amputation site dry with granulation tissue in wound bed. The bullous lesion of the left lower leg appears unchanged from yesterday. Still has brawny discoloration. Palpable thrombosed vein. Expected tenderness surrounding this area. No drainage. Otherwise 2+ femoral, popliteal and DP pulses. Right lower extremity is well perfused and warm Neurologic: alert and oriented  CBC    Component Value Date/Time   WBC 14.9 (H) 06/07/2020 0127   RBC 2.97 (L) 06/07/2020 0127   HGB 8.4 (L) 06/07/2020 0127   HCT 25.8 (L) 06/07/2020 0127   PLT 526 (H) 06/07/2020 0127   MCV 86.9 06/07/2020 0127   MCH 28.3 06/07/2020 0127   MCHC 32.6 06/07/2020 0127   RDW 12.8 06/07/2020 0127   LYMPHSABS 3.0 06/07/2020 0127   MONOABS 1.1 (H) 06/07/2020 0127   EOSABS 0.2 06/07/2020 0127   BASOSABS 0.1 06/07/2020 0127    BMET    Component Value Date/Time   NA 130 (L) 06/07/2020 0127   K 4.2 06/07/2020 0127   CL 95 (L) 06/07/2020 0127   CO2 28 06/07/2020 0127   GLUCOSE 304 (H) 06/07/2020 0127   BUN 20 06/07/2020 0127   CREATININE 0.89 06/07/2020 0127   CALCIUM 8.1 (L) 06/07/2020 0127   GFRNONAA >60 06/07/2020 0127   GFRAA >60 06/07/2020 0127    INR    Component Value Date/Time   INR 1.1 05/30/2020 1049     Intake/Output Summary (Last 24 hours) at 06/07/2020 0736 Last data filed at 06/07/2020 0358 Gross per 24 hour  Intake --  Output 1100 ml  Net -1100 ml     Assessment/Plan:  49 y.o. male is s/p amputation of left great toe 9 Days Post-Op. Left leg well perfused and  warm with palpable DP. Leukocytosis trending down 14.9. Afebrile. Antibiotics per ID. Thrombosis of left lower extremity GSV now on Heparin gtt. Will need to be on anticoagulation for 45 days at discharge. Continue wet to dry dressing changes to left great toe amputation site. Patient may not be able to have [facility] services due to insurance but girlfriend is willing to assist with dressing changes if instructed on how to do so. Will need twice daily changes. Continue to mobilize as tolerated with Darco shoe  DVT prophylaxis:  Heparin gtt   Graceann Congress, PA-C [facility] [phone] 06/07/2020 7:36 AM   I have independently interviewed and examined patient and agree with PA assessment and plan above.  Leukocytosis improving no fever overnight.  Will need oral anticoagulation on discharge for at least.  45 days for extensive superficial venous thrombosis of the left greater saphenous vein.  At least daily wet-to-dry dressings of the left great toe amputation twice daily would be recommended.  He will follow-up in a few weeks in our office for wound check.  Ronnald Shedden C. Randie Heinz, MD [facility] Office: [phone] Pager: [phone]

## 2020-06-07 NOTE — TOC Progression Note (Signed)
Transition of Care [facility]) - Progression Note    Patient Details  Name: Sean Carter MRN: [id number] Date of Birth: 1971-10-06  Transition of Care [facility]) CM/SW Contact  Beckie Busing, RN Phone Number: [phone]  06/07/2020, 11:20 AM  Clinical Narrative:   Consulted for MATCH form completion for medication assistance. Patient currently has an active MATCH form on file effective til 06/11/2020.     Expected Discharge Plan: Home w Home Health Services Barriers to Discharge: Inadequate or no insurance  Expected Discharge Plan and Services Expected Discharge Plan: Home w Home Health Services       Living arrangements for the past 2 months: Single Family Home                 DME Arranged: Walker rolling DME Agency: [employer] Date DME Agency Contacted: 06/03/20 Time DME Agency Contacted: 1330 Representative spoke with at DME Agency: Ian Malkin             Social Determinants of Health (SDOH) Interventions    Readmission Risk Interventions No flowsheet data found.

## 2020-06-08 ENCOUNTER — Other Ambulatory Visit: Payer: Self-pay | Admitting: Family Medicine

## 2020-06-08 DIAGNOSIS — E1165 Type 2 diabetes mellitus with hyperglycemia: Secondary | ICD-10-CM

## 2020-06-08 MED ORDER — NOVOLIN 70/30 RELION (70-30) 100 UNIT/ML ~~LOC~~ SUSP: 30.0000 [IU] | Freq: Two times a day (BID) | SUBCUTANEOUS | 3 refills | Status: DC

## 2020-06-08 NOTE — Progress Notes (Signed)
After-hours call re: "I'm running out of insulin"  Patient called the after-hours line reporting that he is running low on his Lantus.  Unfortunately he was only given 1 vial of Lantus when he discharged from the hospital.  The patient is uninsured and the patient received his insulin and medications through the hospitalist match program, meaning he could only receive the medications free from the hospital one time.  I called the diabetes coordinator on call this weekend from [facility].  We discussed the option of 70/30 insulin for this patient.  The diabetes coordinator recommended that if the patient ran out of his Lantus and he can use 70/30 insulin 30 units twice a day (30 minutes before breakfast, 30 minutes before dinnertime).  I sent this prescription to the [facility] pharmacy in [place], where the patient lives.  I called the patient and informed him that the prescription is there, but that he can wait to pick it up until after he is seen at our clinic on Tuesday.  My hope is that we are able to assist him with some sort of Lantus medication after his appointment on Tuesday, August 24.  If this is not possible, then he has the 70/30 waiting for him at the [facility] pharmacy.  I explained to the patient the differences between Lantus, a long-acting once daily medication, versus 70/30, which is to be taken twice daily with meals.  I emphasized to the patient that it is important he eats meals regularly at regular times of the day and that he takes his insulin every time.  For now, the patient will continue with his Lantus 35-40 units daily.  If the patient does need to switch to 70/30 insulin the prescription is ready for him at the pharmacy.  The patient voiced understanding, stated he is not afraid to ask questions should he need any other direction with regards to his insulin or other medications.  I also strongly believe that he would benefit significantly from meeting with pharmacy to learn more  about diabetes, insulin.  Forwarding this message to Dr. Katha Cabal with whom patient will follow up on Tuesday, August 24, as well as to Dr. Raymondo Band.   Peggyann Shoals, DO [facility], PGY-3 06/08/2020 11:42 AM

## 2020-06-09 LAB — CULTURE, BLOOD (ROUTINE X 2)
Culture: NO GROWTH
Culture: NO GROWTH
Special Requests: ADEQUATE
Special Requests: ADEQUATE

## 2020-06-10 ENCOUNTER — Inpatient Hospital Stay: Payer: Self-pay

## 2020-06-11 ENCOUNTER — Ambulatory Visit (INDEPENDENT_AMBULATORY_CARE_PROVIDER_SITE_OTHER): Payer: Self-pay | Admitting: Family Medicine

## 2020-06-11 ENCOUNTER — Other Ambulatory Visit: Payer: Self-pay

## 2020-06-11 VITALS — BP 138/75 | HR 84 | Wt 190.0 lb

## 2020-06-11 DIAGNOSIS — E1159 Type 2 diabetes mellitus with other circulatory complications: Secondary | ICD-10-CM

## 2020-06-11 DIAGNOSIS — T402X5A Adverse effect of other opioids, initial encounter: Secondary | ICD-10-CM

## 2020-06-11 DIAGNOSIS — E1151 Type 2 diabetes mellitus with diabetic peripheral angiopathy without gangrene: Secondary | ICD-10-CM

## 2020-06-11 DIAGNOSIS — I10 Essential (primary) hypertension: Secondary | ICD-10-CM

## 2020-06-11 DIAGNOSIS — I82890 Acute embolism and thrombosis of other specified veins: Secondary | ICD-10-CM

## 2020-06-11 DIAGNOSIS — Z1589 Genetic susceptibility to other disease: Secondary | ICD-10-CM

## 2020-06-11 DIAGNOSIS — E785 Hyperlipidemia, unspecified: Secondary | ICD-10-CM

## 2020-06-11 DIAGNOSIS — Z09 Encounter for follow-up examination after completed treatment for conditions other than malignant neoplasm: Secondary | ICD-10-CM

## 2020-06-11 DIAGNOSIS — K5903 Drug induced constipation: Secondary | ICD-10-CM

## 2020-06-11 LAB — CULTURE, BLOOD (ROUTINE X 2)
Culture: NO GROWTH
Culture: NO GROWTH
Special Requests: ADEQUATE
Special Requests: ADEQUATE

## 2020-06-11 MED ORDER — SENNA 8.6 MG PO TABS: 1.0000 | ORAL_TABLET | Freq: Every day | ORAL | 0 refills | Status: DC | PRN

## 2020-06-11 MED ORDER — INSULIN GLARGINE 100 UNIT/ML ~~LOC~~ SOLN: 35.0000 [IU] | Freq: Every day | SUBCUTANEOUS | 0 refills | Status: DC

## 2020-06-11 MED ORDER — FIASP FLEXTOUCH 100 UNIT/ML ~~LOC~~ SOPN: 6.0000 [IU] | PEN_INJECTOR | Freq: Three times a day (TID) | SUBCUTANEOUS | 0 refills | Status: DC

## 2020-06-11 MED ORDER — POLYETHYLENE GLYCOL 3350 17 G PO PACK: 17.0000 g | PACK | Freq: Every day | ORAL | 0 refills | Status: DC

## 2020-06-11 MED ORDER — LOSARTAN POTASSIUM 25 MG PO TABS: 12.5000 mg | ORAL_TABLET | Freq: Every day | ORAL | 0 refills | Status: DC

## 2020-06-11 MED ORDER — ATORVASTATIN CALCIUM 40 MG PO TABS: 40.0000 mg | ORAL_TABLET | Freq: Every day | ORAL | 0 refills | Status: DC

## 2020-06-11 MED ORDER — METFORMIN HCL 500 MG PO TABS: 500.0000 mg | ORAL_TABLET | Freq: Two times a day (BID) | ORAL | 0 refills | Status: DC

## 2020-06-11 MED ORDER — RIVAROXABAN 20 MG PO TABS: 20.0000 mg | ORAL_TABLET | Freq: Every day | ORAL | 0 refills | Status: DC

## 2020-06-11 NOTE — Progress Notes (Signed)
Asked by Dr. Rachael Darby to assist with samples of insulin and Xarelto. Plan for telephone follow up in two days.

## 2020-06-11 NOTE — Progress Notes (Signed)
   SUBJECTIVE:   CHIEF COMPLAINT / HPI:   Chief Complaint  Patient presents with  . Hospitalization Follow-up    wound care     Sean Carter is a 49 y.o. male here for hospital follow up. Patient here with his wife.  Diabetes Mellitus  Denies missing any doses of DM medications.  This morning CBG 220. AM fasting CBG140-220, then throughout the day range from 170-330. Taking Lipitor, Metformin, insulin 35 units.     DVT with Cellulitis.  Taking Xarelto. Taking oxycodone, mostly at bedtime. Leg swelling continues.  He has not been elevating his leg throughout the day.  Taking Keflex and Xarelto.  Denies nosebleeds, hematuria, hematochezia or melena. Wife has been changing wound dressing at home.    PERTINENT  PMH / PSH:  reviewed and updated as appropriate   OBJECTIVE:   BP 138/75   Pulse 84   Wt 190 lb (86.2 kg)   SpO2 98%   BMI 28.89 kg/m    GEN: well developed, non ill appearing male, in no acute distress  CV: regular rate and rhythm, no murmurs appreciated  RESP: no increased work of breathing, clear to ascultation bilaterally with no crackles, wheezes, or rhonchi  ABD: Soft, Nontender, Nondistended. MSK:  LLE edematous with 2+ pitting edema, no RLE edema SKIN: warm, dry, LLE superficial wound healing well (see image under media tab) NEURO: grossly normal, moves all extremities appropriately PSYCH: Normal affect, appropriate speech and behavior        ASSESSMENT/PLAN:   Hospital Follow up: Patient doing well. His wife is actively involved in his care.  VSS. Patient has scheduled vascular follow up.   Diabetes mellitus type 2 with peripheral artery disease ([name]) -Patient to keep log of blood sugars and bring to next visit - Medication regimen: Continue Lantus 35 units, Start 6 units with meals (three times daily)  -Pharmacy team will check in with pt next week     Superficial vein thrombosis - Patient to take Xarelto for 45 days as this was superficial  not deep vein thrombosis - No issues with bleeding identified  - Advised patient to keep leg elevated when resting to help with healing of amputation and wound healing - Has vascular follow up - Follow up in 2 weeks for wound check.   Katha Cabal, DO PGY-2, [facility] 06/11/2020

## 2020-06-11 NOTE — Patient Instructions (Addendum)
It was great seeing you today!  Please check-out at the front desk before leaving the clinic. I'd like to see you back in 1 month but if you need to be seen earlier than that for any new issues we're happy to fit you in, just give [place] a call!   Visit Remembers: - Stop by the pharmacy to pick up your prescriptions  - Continue to work on your healthy eating habits and incorporating exercise into your daily life.  - Your goal is to have an BP <120/80 - Keep track of your blood sugars, bring your log with you - It is important for your to keep your leg elevated when resting!  - Medication changes: Continue Lantus 35 units, Take 6 units with meals daily  - The pharmacy team will check in with you next week  -Take Miralax and Senna for constipation as needed   Regarding lab work today:  Due to recent changes in healthcare laws, you may see the results of your imaging and laboratory studies on MyChart before your provider has had a chance to review them.  I understand that in some cases there may be results that are confusing or concerning to you. Not all laboratory results come back in the same time frame and you may be waiting for multiple results in order to interpret others.  Please give [place] 72 hours in order for your provider to thoroughly review all the results before contacting the office for clarification of your results. If everything is normal, you will get a letter in the mail or a message in My Chart. Please give [place] a call if you do not hear from [place] after 2 weeks.  Please bring all of your medications with you to each visit.    If you haven't already, sign up for My Chart to have easy access to your labs results, and communication with your primary care physician.  Feel free to call with any questions or concerns at any time, at [phone].   Take care,  Dr. Katherina [facility]

## 2020-06-12 LAB — BASIC METABOLIC PANEL
BUN/Creatinine Ratio: 19 (ref 9–20)
BUN: 15 mg/dL (ref 6–24)
CO2: 28 mmol/L (ref 20–29)
Calcium: 8.9 mg/dL (ref 8.7–10.2)
Chloride: 94 mmol/L — ABNORMAL LOW (ref 96–106)
Creatinine, Ser: 0.77 mg/dL (ref 0.76–1.27)
GFR calc Af Amer: 123 mL/min/{1.73_m2} (ref >59)
GFR calc non Af Amer: 107 mL/min/{1.73_m2} (ref >59)
Glucose: 160 mg/dL — ABNORMAL HIGH (ref 65–99)
Potassium: 5.2 mmol/L (ref 3.5–5.2)
Sodium: 134 mmol/L (ref 134–144)

## 2020-06-13 ENCOUNTER — Telehealth: Payer: Self-pay | Admitting: Pharmacist

## 2020-06-13 DIAGNOSIS — E1151 Type 2 diabetes mellitus with diabetic peripheral angiopathy without gangrene: Secondary | ICD-10-CM

## 2020-06-13 MED ORDER — FIASP FLEXTOUCH 100 UNIT/ML ~~LOC~~ SOPN: 8.0000 [IU] | PEN_INJECTOR | Freq: Three times a day (TID) | SUBCUTANEOUS | Status: DC

## 2020-06-13 MED ORDER — INSULIN GLARGINE 100 UNIT/ML ~~LOC~~ SOLN: 30.0000 [IU] | Freq: Every day | SUBCUTANEOUS | Status: DC

## 2020-06-13 NOTE — Telephone Encounter (Signed)
Noted and agree. 

## 2020-06-13 NOTE — Telephone Encounter (Signed)
Called patient to follow up on blood glucose readings after adding mealtime insulin (Fiasp) and education on using insulin pens at his PCP visit Tuesday.   Patient confirmed that he is taking Lantus 35 units daily in the morning and Fiasp 6 units three times daily with meals. He is checking his blood sugars at home and reports that yesterday the readings were 139, 169, and 230. This morning when he checked it was 105. He states that his wife was helping him with his injections but he is now able to do it and reports having no difficulties with administration.   Blood glucose readings are improving with addition of mealtime insulin. Will adjust insulin regimen today:  - Decrease Lantus to 30 units daily in the morning - Increase Fiasp to 8 units daily three times daily with meals - Continue to check blood glucose at least three times daily - Monitor for signs and symptoms of hypoglycemia  Will plan to follow up with patient at his nurse visit on Tuesday morning.

## 2020-06-14 ENCOUNTER — Telehealth: Payer: Self-pay

## 2020-06-14 ENCOUNTER — Other Ambulatory Visit: Payer: Self-pay | Admitting: Physician Assistant

## 2020-06-14 ENCOUNTER — Telehealth: Payer: Self-pay | Admitting: Pharmacist

## 2020-06-14 MED ORDER — OXYCODONE HCL 5 MG PO TABS
5.0000 mg | ORAL_TABLET | Freq: Four times a day (QID) | ORAL | 0 refills | Status: DC | PRN
Start: 2020-06-14 — End: 2020-09-08

## 2020-06-14 NOTE — Telephone Encounter (Signed)
Amy calls back asking to add a social worker eval.   Orders given for social work eval.

## 2020-06-14 NOTE — Telephone Encounter (Signed)
Call from patient with multiple questions.   Patient asked if 7days of antibiotics was the total supply or if he needed more days.   I shared that if he completed the 7 days prescribed he should consider the course complete, monitor for any erythema, swelling or fever.     He also asked is he was able to get more pain medication, stating he is only using 1 at night and also stated he realized he did not want to get addicted.  I shared that there are rules on the number of days supplied post procedure and he would need to contact the surgeon's office for additional discussion on a refill.  He was Ok with that response.   He shared that his blood sugars were doing very well. He shared readings in the low 100s fasting.  No insulin dose changes were suggested.  He has a follow-up appointment in 4 days (Tuesday).   He thanked me for the answers to his questions.

## 2020-06-14 NOTE — Progress Notes (Signed)
PDMP reviewed.

## 2020-06-14 NOTE — Telephone Encounter (Signed)
Patient called stating he would like to know what he should do about pain medication and abx. He states he would like to know after he si done with ABX should he have a refill, also that he only has 3 oxycodone's left. He states he doesn't take them often but he does need them to help him sleep without having pains. Requesting a call back.

## 2020-06-14 NOTE — Telephone Encounter (Signed)
Sean Carter, HH PT, calls nurse line requesting VO for [facility] PT as follows:  2x a week for 3 weeks.   Verbal orders given per [facility] protocol.

## 2020-06-14 NOTE — Telephone Encounter (Signed)
I spoke with Aggie Moats, PA-C, he reviewed patient's chart and agreed to send in a RX of Oxycodone HCL 5mg  QNTY:10 until patient's appt. I also advised patient that after he has completed the entire abx he shouldn't need an additional RX. Patient voiced his understanding.

## 2020-06-17 ENCOUNTER — Encounter: Payer: Self-pay | Admitting: Family Medicine

## 2020-06-17 NOTE — Assessment & Plan Note (Signed)
-  Patient to keep log of blood sugars and bring to next visit - Medication regimen: Continue Lantus 35 units, Start 6 units with meals (three times daily)  -Pharmacy team will check in with pt next week

## 2020-06-17 NOTE — Assessment & Plan Note (Signed)
-   Patient to take Xarelto for 45 days as this was superficial not deep vein thrombosis - No issues with bleeding identified  - Advised patient to keep leg elevated when resting to help with healing of amputation and wound healing

## 2020-06-18 ENCOUNTER — Telehealth: Payer: Self-pay | Admitting: Student-PharmD

## 2020-06-18 ENCOUNTER — Ambulatory Visit: Payer: Self-pay

## 2020-06-18 ENCOUNTER — Telehealth: Payer: Self-pay | Admitting: *Deleted

## 2020-06-18 NOTE — Telephone Encounter (Signed)
Patient called asking if there was any activity restrictions since he recently had surgery . Patient is wanting to schedule an eye appt and some other things. Patient has appt next week with Dr Chestine Spore advised patient to keep that appt and discuss any restrictions with Dr Chestine Spore at that time. Patient verbalized understanding.

## 2020-06-18 NOTE — Progress Notes (Signed)
I also have reviewed and agree with the documentation and management of the pharmacy team during this visit.

## 2020-06-18 NOTE — Telephone Encounter (Signed)
Called patient to discuss how his blood sugars are doing on the new insulin regimen. Left voicemail with call back number --will follow up via phone in a couple days if haven't heard back.    Pervis Hocking, PharmD PGY1 Pharmacy Resident 06/18/2020 3:22 PM

## 2020-06-20 ENCOUNTER — Telehealth (INDEPENDENT_AMBULATORY_CARE_PROVIDER_SITE_OTHER): Payer: Self-pay | Admitting: Infectious Disease

## 2020-06-20 ENCOUNTER — Encounter: Payer: Self-pay | Admitting: Infectious Disease

## 2020-06-20 ENCOUNTER — Other Ambulatory Visit: Payer: Self-pay

## 2020-06-20 DIAGNOSIS — S98112A Complete traumatic amputation of left great toe, initial encounter: Secondary | ICD-10-CM

## 2020-06-20 DIAGNOSIS — R011 Cardiac murmur, unspecified: Secondary | ICD-10-CM

## 2020-06-20 DIAGNOSIS — I82890 Acute embolism and thrombosis of other specified veins: Secondary | ICD-10-CM

## 2020-06-20 DIAGNOSIS — E1151 Type 2 diabetes mellitus with diabetic peripheral angiopathy without gangrene: Secondary | ICD-10-CM

## 2020-06-20 DIAGNOSIS — E1165 Type 2 diabetes mellitus with hyperglycemia: Secondary | ICD-10-CM

## 2020-06-20 DIAGNOSIS — A401 Sepsis due to streptococcus, group B: Secondary | ICD-10-CM

## 2020-06-20 DIAGNOSIS — I96 Gangrene, not elsewhere classified: Secondary | ICD-10-CM

## 2020-06-20 HISTORY — DX: Sepsis due to Streptococcus, group B: A40.1

## 2020-06-20 NOTE — Progress Notes (Signed)
Virtual Visit via Telephone Note  I connected with Sean Carter on 06/20/20 at  9:45 AM EDT by telephone and verified that I am speaking with the correct person using two identifiers.  Location: Patient: Home Provider: I was at my home   I discussed the limitations, risks, security and privacy concerns of performing an evaluation and management service by telephone and the availability of in person appointments. I also discussed with the patient that there may be a patient responsible charge related to this service. The patient expressed understanding and agreed to proceed.   History of Present Illness:  49 y.o. male with diabetes who presented for care after 2 week history of worsening left great toe pain. He was found to have necrotizing fasciitis infection of the left great toe and proceeded with amputation. He had secondary bacteremia due to group b streptococcus at the time as well. TTE was negative. He was found later to have a superficial thrombophlebitis on left as well. He was treated with IV ceftriaxone and then keflex which he finished roughly 3 days ago.  He says his amputation site is clean and without redness or drainage. His supeficial clot which was previously black is now red and less tender. He is trying to get his DM under better control. He has signficant neuropathy and PVD. 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   He is seeing [facility] in a few days.      Past Medical History:  Diagnosis Date   Diabetes mellitus without complication ([name])    Gangrene of toe of left foot ([name])    Sepsis due to Streptococcus, group B ([name]) 06/20/2020    Past Surgical History:  Procedure Laterality Date   AMPUTATION Left 05/31/2020   Procedure: Ray Amputation of Left Great Toe with Negative Pressure Vac Placement;  Surgeon: Cephus Shelling, MD;  Location: [facility]  OR;  Service: Vascular;  Laterality: Left;    No family history on file.    Social History   Socioeconomic History   Marital status: Married    Spouse name: Not on file   Number of children: Not on file   Years of education: Not on file   Highest education level: Not on file  Occupational History   Not on file  Tobacco Use   Smoking status: Never Smoker   Smokeless tobacco: Never Used  Vaping Use   Vaping Use: Never used  Substance and Sexual Activity   Alcohol use: Not on file   Drug use: Never   Sexual activity: Not on file  Other Topics Concern   Not on file  Social History Narrative   Not on file   Social Determinants of Health   Financial Resource Strain:    Difficulty of Paying Living Expenses: Not on file  Food Insecurity:    Worried About Running Out of Food in the Last Year: Not on file   Ran Out of Food in the Last Year: Not on file  Transportation Needs:    Lack of Transportation (Medical): Not on file   Lack of Transportation (Non-Medical): Not on file  Physical Activity:    Days of Exercise per Week: Not on file   Minutes of Exercise per Session: Not on file  Stress:    Feeling of Stress : Not on file  Social Connections:  Frequency of Communication with Friends and Family: Not on file   Frequency of Social Gatherings with Friends and Family: Not on file   Attends Religious Services: Not on file   Active Member of Clubs or Organizations: Not on file   Attends Banker Meetings: Not on file   Marital Status: Not on file    Allergies  Allergen Reactions   Scallops [Shellfish Allergy] Anaphylaxis     Current Outpatient Medications:    atorvastatin (LIPITOR) 40 MG tablet, Take 1 tablet (40 mg total) by mouth daily., Disp: 90 tablet, Rfl: 0   insulin aspart (FIASP FLEXTOUCH) 100 UNIT/ML FlexTouch Pen, Inject 8 Units into the skin 3 (three) times daily before meals., Disp: , Rfl:    insulin glargine  (LANTUS) 100 UNIT/ML injection, Inject 0.3 mLs (30 Units total) into the skin daily., Disp: , Rfl:    losartan (COZAAR) 25 MG tablet, Take 0.5 tablets (12.5 mg total) by mouth daily., Disp: 45 tablet, Rfl: 0   metFORMIN (GLUCOPHAGE) 500 MG tablet, Take 1 tablet (500 mg total) by mouth 2 (two) times daily with a meal., Disp: 180 tablet, Rfl: 0   oxyCODONE (ROXICODONE) 5 MG immediate release tablet, Take 1 tablet (5 mg total) by mouth every 6 (six) hours as needed for severe pain., Disp: 15 tablet, Rfl: 0   polyethylene glycol (MIRALAX) 17 g packet, Take 17 g by mouth daily., Disp: 14 each, Rfl: 0   rivaroxaban (XARELTO) 20 MG TABS tablet, Take 1 tablet (20 mg total) by mouth daily with supper. With a meal, Disp: 56 tablet, Rfl: 0   Rivaroxaban Stater Pack, 15 mg and 20 mg, (XARELTO STARTER PACK), Follow package directions: Take one 15mg  tablet by mouth twice a day. On day 22, switch to one 20mg  tablet once a day. Take with food., Disp: 51 each, Rfl: 0   senna (SENOKOT) 8.6 MG TABS tablet, Take 1 tablet (8.6 mg total) by mouth daily as needed for mild constipation., Disp: 120 tablet, Rfl: 0   ULTICARE INSULIN SYRINGE 30G X 1/2" 0.3 ML MISC, See admin instructions., Disp: , Rfl:   ROS as above otherwise 12 point ROS was negative    Observations/Objective:  Over the phone he sounded well and based on his description appears to have responded to surgery and antibiotics  Assessment and Plan:  GBS bacteremia secondary to DFU with gangrene, sp amputation  --I will bring him back in a few weeks and get surveillance blood cultures   [place]: he never got TEE and hence importance of surveillance cultures  Superficial clot: resolving  DM: better controlled  COVID prevention: he is getting 2nd shot in a few weeks  Follow Up Instructions:    I discussed the assessment and treatment plan with the patient. The patient was provided an opportunity to ask questions and all were answered.  The patient agreed with the plan and demonstrated an understanding of the instructions.   The patient was advised to call back or seek an in-person evaluation if the symptoms worsen or if the condition fails to improve as anticipated.  I provided 11 minutes of non-face-to-face time during this encounter.   , MD

## 2020-06-21 ENCOUNTER — Telehealth: Payer: Self-pay | Admitting: Pharmacist

## 2020-06-21 DIAGNOSIS — E1151 Type 2 diabetes mellitus with diabetic peripheral angiopathy without gangrene: Secondary | ICD-10-CM

## 2020-06-21 MED ORDER — INSULIN GLARGINE 100 UNIT/ML ~~LOC~~ SOLN: 26.0000 [IU] | Freq: Every day | SUBCUTANEOUS | Status: DC

## 2020-06-21 NOTE — Telephone Encounter (Signed)
Late Entry - Phone call conducted with Pervis Hocking, PharmD  Contacted patient to follow-up on blood sugar control.  Patient reports improved control and feels like this is the best he has been controlled in a long time.   Note: He accidentally picked up the 70/30 from the pharmacy, told him he could bring it to his next visit and we could dispose of it for him as this is not part of his current regimen.   Reported home blood sugars as 111-130 in the morning, but having some 80s, as high as 168. Not having any symptoms of lows.    Following discussion:   Decrease Lantus further from 30 to 26 units daily to minimize low glucose.  Continue the Fiasp at 8 units TID with meals at this time.   Plan follow up in 1 week by phone.

## 2020-06-25 ENCOUNTER — Ambulatory Visit (INDEPENDENT_AMBULATORY_CARE_PROVIDER_SITE_OTHER): Payer: Self-pay | Admitting: Vascular Surgery

## 2020-06-25 ENCOUNTER — Other Ambulatory Visit: Payer: Self-pay

## 2020-06-25 ENCOUNTER — Telehealth: Payer: Self-pay | Admitting: Pharmacist

## 2020-06-25 ENCOUNTER — Other Ambulatory Visit: Payer: Self-pay | Admitting: *Deleted

## 2020-06-25 ENCOUNTER — Encounter: Payer: Self-pay | Admitting: Vascular Surgery

## 2020-06-25 VITALS — BP 198/118 | HR 80 | Temp 98.0°F | Resp 18

## 2020-06-25 DIAGNOSIS — S98112A Complete traumatic amputation of left great toe, initial encounter: Secondary | ICD-10-CM

## 2020-06-25 NOTE — Progress Notes (Signed)
Pack left left great toe amputation site with saline wet to dry daily. Wrap with kerlix. To wound on lower leg place a small piece of xeroform over wound and then a 4x4 to cover.

## 2020-06-25 NOTE — Telephone Encounter (Signed)
Contacted patient to review blood glucose control and supply of insulin.  Insulin supply is > 45 days per report of amount he has on hand.   Patient reports blood sugars are "excellent".  He went on to describe most readings 100-150 with one bad day of 187 blood sugar.  He has had readings < 100 and a single episode of low blood sugar when he failed to eat on schedule due to being busy.   We discussed potential for needling less insulin ( a few units) as he heals and the stress of the amputation resolves.   No further follow-up planned.   I encouraged him to call if his insulin supply is concerning or if his readings worsen.  He verbalized appreciation for the call and understanding of the treatment plan.

## 2020-06-25 NOTE — Progress Notes (Signed)
Patient name: Sean Carter MRN: [id number] DOB: Sep 25, 1971 Sex: male  REASON FOR VISIT: Post-op check after left great toe ray amputation  HPI: Sean Carter is a 49 y.o. male with history of diabetes that presents for postop check after left great toe ray amputation on 05/31/2020.  Initially came to the ED with sepsis and had gangrene of his left great toe.  He has been getting wet-to-dry dressings at home and his wife has been changing the wound.  He's had no fevers, no abnormal drainage, no foul appearing tissue.  No complaints today.  Past Medical History:  Diagnosis Date   Diabetes mellitus without complication ([name])    Gangrene of toe of left foot ([name])    Sepsis due to Streptococcus, group B ([name]) 06/20/2020    Past Surgical History:  Procedure Laterality Date   AMPUTATION Left 05/31/2020   Procedure: Ray Amputation of Left Great Toe with Negative Pressure Vac Placement;  Surgeon: Cephus Shelling, MD;  Location: [facility] OR;  Service: Vascular;  Laterality: Left;    No family history on file.  SOCIAL HISTORY: Social History   Tobacco Use   Smoking status: Never Smoker   Smokeless tobacco: Never Used  Substance Use Topics   Alcohol use: Not on file    Allergies  Allergen Reactions   Scallops [Shellfish Allergy] Anaphylaxis    Current Outpatient Medications  Medication Sig Dispense Refill   atorvastatin (LIPITOR) 40 MG tablet Take 1 tablet (40 mg total) by mouth daily. 90 tablet 0   insulin aspart (FIASP FLEXTOUCH) 100 UNIT/ML FlexTouch Pen Inject 8 Units into the skin 3 (three) times daily before meals.     insulin glargine (LANTUS) 100 UNIT/ML injection Inject 0.26 mLs (26 Units total) into the skin daily. 10 mL    losartan (COZAAR) 25 MG tablet Take 0.5 tablets (12.5 mg total) by mouth daily. 45 tablet 0   metFORMIN (GLUCOPHAGE) 500 MG tablet Take 1 tablet (500 mg total) by mouth 2 (two) times daily with a meal. 180 tablet 0   oxyCODONE (ROXICODONE) 5  MG immediate release tablet Take 1 tablet (5 mg total) by mouth every 6 (six) hours as needed for severe pain. 15 tablet 0   polyethylene glycol (MIRALAX) 17 g packet Take 17 g by mouth daily. 14 each 0   rivaroxaban (XARELTO) 20 MG TABS tablet Take 1 tablet (20 mg total) by mouth daily with supper. With a meal 56 tablet 0   Rivaroxaban Stater Pack, 15 mg and 20 mg, (XARELTO STARTER PACK) Follow package directions: Take one 15mg  tablet by mouth twice a day. On day 22, switch to one 20mg  tablet once a day. Take with food. 51 each 0   senna (SENOKOT) 8.6 MG TABS tablet Take 1 tablet (8.6 mg total) by mouth daily as needed for mild constipation. 120 tablet 0   ULTICARE INSULIN SYRINGE 30G X 1/2" 0.3 ML MISC See admin instructions.     No current facility-administered medications for this visit.    REVIEW OF SYSTEMS:  [X]  denotes positive finding, [ ]  denotes negative finding Cardiac  Comments:  Chest pain or chest pressure:    Shortness of breath upon exertion:    Short of breath when lying flat:    Irregular heart rhythm:        Vascular    Pain in calf, thigh, or hip brought on by ambulation:    Pain in feet at night that wakes you up from your sleep:  Blood clot in your veins:    Leg swelling:         Pulmonary    Oxygen at home:    Productive cough:     Wheezing:         Neurologic    Sudden weakness in arms or legs:     Sudden numbness in arms or legs:     Sudden onset of difficulty speaking or slurred speech:    Temporary loss of vision in one eye:     Problems with dizziness:         Gastrointestinal    Blood in stool:     Vomited blood:         Genitourinary    Burning when urinating:     Blood in urine:        Psychiatric    Major depression:         Hematologic    Bleeding problems:    Problems with blood clotting too easily:        Skin    Rashes or ulcers:        Constitutional    Fever or chills:      PHYSICAL EXAM: There were no vitals  filed for this visit.  GENERAL: The patient is a well-nourished male, in no acute distress. The vital signs are documented above. CARDIAC: There is a regular rate and rhythm.  VASCULAR:  Left PT palpable Left great toe ray amputation seen below with good granulation tissue, no abnormal drainage      DATA:   None  Assessment/Plan:  49 year old male with diabetes that presents for postop check after ray amputation of the left great toe on 05/31/2020.  Left great toe amputation looks excellent and he has good granulation tissue.  He has a palpable PT pulse at the ankle.  Discussed the importance of controlling his blood sugars and his wife states blood sugars have been much better controlled since discharge.  We will try and arrange home health for wet-to-dry dressing changes twice daily but has had insurance issues in the past and could not get vacuum dressing approved.  The wife is doing an excellent job with wound care.  I will have him follow-up again in 1 month for wound check.  He knows to call with questions or concerns.   Cephus Shelling, MD [facility] Office: [phone]

## 2020-06-26 ENCOUNTER — Ambulatory Visit: Payer: Self-pay

## 2020-06-27 ENCOUNTER — Telehealth: Payer: Self-pay

## 2020-06-27 NOTE — Telephone Encounter (Signed)
Received phone call from Amy, [facility] RN with [facility] reporting elevated BP. BP today is 178/90, HR 78, all other vitals WNL. Patient is currently asymptomatic.   Spoke with patient. Scheduled earlier follow up appointment on Tuesday morning. Patient reports medication compliance. Provided with ED precautions. Patient verbalizes understanding. Encouraged patient to continue with current medications and to eat heart healthy diet.   Please advise any additional recommendations for patient.   To PCP  Veronda Prude, RN

## 2020-06-28 ENCOUNTER — Telehealth: Payer: Self-pay

## 2020-06-28 NOTE — Telephone Encounter (Signed)
Called [facility] to f/u on wound care services for pt. Spoke with Maralyn Sago and advised that it was my understanding that [facility] has contracted and agreed to cover some home health services needed for this pt. Huntley Dec stated she would have to contact her supervisor to get clarity on how to move forward with services needed. Huntley Dec called back to inform me that per her supervisor the pt has already received PT/OT and to add nursing care the pt would have to pay out of pocket. She stated the hospital only covered a certain amount of visits. Advised that I would contact the pt and make him aware. Contacted Mr. Wilensky to advise of communication I had with Huntley Dec. Mr. Fehl was quite frustrated and stated " I really did not even need physical therapy"  And he also stated he did not know anything about the services being granted through [facility]. Pt stated that his wound is doing great and his girlfriend whom was taught in our office performs his wound care. He stated he not interested in paying out of pocket for [facility] services but is concerned because his girlfriend had to go out of town 9/24 and 9/25. I offered that our office would do his wound care in her absence, if he would like to come here. Also asked if he had a friend/relative that could do it the days that she would be gone, he stated "no". Pt stated he would contact me Doyne Keel) directly on Monday to let me know his plan. Also advised pt that if the wound becomes painful, red, warm to the touch, begins to drain or show signs of infection to contact our office immediately and our office is closed to report to the nearest ED. He stated " Dr. Chestine Spore gave me his card and told me not to go to the ED, to call him after hours if anything occurs"  I advised him to follow the doctors instructions. He verbalized understanding and agreed with this plan.

## 2020-07-02 ENCOUNTER — Encounter: Payer: Self-pay | Admitting: Family Medicine

## 2020-07-02 ENCOUNTER — Ambulatory Visit (INDEPENDENT_AMBULATORY_CARE_PROVIDER_SITE_OTHER): Payer: Self-pay | Admitting: Family Medicine

## 2020-07-02 ENCOUNTER — Other Ambulatory Visit: Payer: Self-pay

## 2020-07-02 VITALS — BP 170/80 | HR 86 | Wt 191.0 lb

## 2020-07-02 DIAGNOSIS — I10 Essential (primary) hypertension: Secondary | ICD-10-CM

## 2020-07-02 DIAGNOSIS — E1159 Type 2 diabetes mellitus with other circulatory complications: Secondary | ICD-10-CM

## 2020-07-02 DIAGNOSIS — I82890 Acute embolism and thrombosis of other specified veins: Secondary | ICD-10-CM

## 2020-07-02 DIAGNOSIS — E1165 Type 2 diabetes mellitus with hyperglycemia: Secondary | ICD-10-CM

## 2020-07-02 MED ORDER — RELION ALCOHOL SWABS PADS: 1.0000 | MEDICATED_PAD | Freq: Three times a day (TID) | 3 refills | Status: AC | PRN

## 2020-07-02 MED ORDER — RELION LANCET DEVICES 30G MISC: 1.0000 | Freq: Three times a day (TID) | 3 refills | Status: DC | PRN

## 2020-07-02 MED ORDER — RIVAROXABAN 20 MG PO TABS: 20.0000 mg | ORAL_TABLET | Freq: Every day | ORAL | 0 refills | Status: DC

## 2020-07-02 MED ORDER — RELION LANCETS THIN 26G MISC: 1.0000 | Freq: Three times a day (TID) | 3 refills | Status: DC | PRN

## 2020-07-02 MED ORDER — RELION ALL-IN-ONE DEVI: 1.0000 | Freq: Three times a day (TID) | 3 refills | Status: DC | PRN

## 2020-07-02 NOTE — Progress Notes (Signed)
   SUBJECTIVE:   CHIEF COMPLAINT / HPI:   Chief Complaint  Patient presents with  . Follow-up    bp     Sean Carter is a 49 y.o. male here for follow up.   HTN Patient's home health provider reported he had a elevated BP of 178/90 on 06/27/20. Patient 190/80 at Dr. Julieanne Cotton office.  Takes Losartan. Denies missing doses of antihypertensive medications. Denies chest pain, palpitations, lower extremity edema, exertional dyspnea, lightheadedness, headaches and vision changes. Took some NSAIDs which caused a nose bleed and allegery medication at bedtime. Does not take his BP at home.    Diabetes Mellitus  Patient follows with the pharmacy team (Dr. Raymondo Band) as well. Reports blood sugars have been 94-143 fasting.  Eats 3 meals a day and keto friendly granola, beef jerky, Ensure, fruit cups, popcorn. Denies missing any doses of DM medications and takes Lantus and 8 units meal time insulin . Denies increased thirst, hunger, or frequent urination. Checks glucose with glucometer at least twice a day.    PERTINENT  PMH / PSH:  reviewed and updated as appropriate   OBJECTIVE:   BP (!) 170/80   Pulse 86   Wt 191 lb (86.6 kg)   SpO2 96%   BMI 29.04 kg/m    GEN: well developed male, in no acute distress  CV: regular rate and rhythm, no murmurs appreciated  RESP: no increased work of breathing, clear to ascultation bilaterally  MSK: wearing left post-op shoe.  SKIN: warm, dry, superficial wound cleansed non-adhesive dressing applied, granulation tissue present, no warmth or surrounding erythema, right knee abrasion  NEURO: alert, oriented, moves all extremities appropriately    ASSESSMENT/PLAN:   Superficial vein thrombosis Xarelto Rx refilled for another 15 days.  Per vasc surgery, pt to complete 45d of treatment. Follow up with vascular as scheduled.   Hypertension associated with diabetes ([name]) Uncontrolled. Increase Losartan to 25 mg daily. Follow up in 1 month   Diabetes mellitus  type II, uncontrolled ([name]) Continue current regimen.  Previous a1c 11.3. Home blood sugars at at goal. Encouraged continued diet rich in vegetables and complex carbs.  Heart healthy carb modified diet. Counseled on need to continue exercising.   Statin therapy: crestor ACEi/ARB: Losartan Foot exam: needs  Urine microalbumine: needs Eye exam: patient has scheduled appt           Katha Cabal, DO PGY-2, [facility] 07/02/2020

## 2020-07-02 NOTE — Assessment & Plan Note (Addendum)
Continue current regimen.  Previous a1c 11.3. Home blood sugars at at goal. Encouraged continued diet rich in vegetables and complex carbs.  Heart healthy carb modified diet. Counseled on need to continue exercising.   Statin therapy: crestor ACEi/ARB: Losartan Foot exam: needs  Urine microalbumine: needs Eye exam: patient has scheduled appt

## 2020-07-02 NOTE — Patient Instructions (Addendum)
It was great seeing you today!  Please check-out at the front desk before leaving the clinic. I'd like to see you back in 1 month but if you need to be seen earlier than that for any new issues we're happy to fit you in, just give [place] a call!  Visit Remembers: - Stop by the pharmacy to pick up your prescriptions  - Continue to work on your healthy eating habits and incorporating exercise into your daily life.  - Your goal is to have an BP <120/80 - Medicine Changes: Increase Losartan to 25 mg daily (whole tablet)     Regarding lab work today:  Due to recent changes in healthcare laws, you may see the results of your imaging and laboratory studies on MyChart before your provider has had a chance to review them.  I understand that in some cases there may be results that are confusing or concerning to you. Not all laboratory results come back in the same time frame and you may be waiting for multiple results in order to interpret others.  Please give [place] 72 hours in order for your provider to thoroughly review all the results before contacting the office for clarification of your results. If everything is normal, you will get a letter in the mail or a message in My Chart. Please give [place] a call if you do not hear from [place] after 2 weeks.  Please bring all of your medications with you to each visit.    If you haven't already, sign up for My Chart to have easy access to your labs results, and communication with your primary care physician.  Feel free to call with any questions or concerns at any time, at [phone].   Take care,  Dr. Katherina [facility]

## 2020-07-02 NOTE — Assessment & Plan Note (Signed)
Xarelto Rx refilled for another 15 days.  Per vasc surgery, pt to complete 45d of treatment. Follow up with vascular as scheduled.

## 2020-07-02 NOTE — Assessment & Plan Note (Signed)
Uncontrolled. Increase Losartan to 25 mg daily. Follow up in 1 month

## 2020-07-03 ENCOUNTER — Telehealth: Payer: Self-pay

## 2020-07-03 NOTE — Telephone Encounter (Signed)
Patient calls nurse line regarding concerns for medication management. Patient reports that he cannot afford his Xarelto prescription. Patient is in the process of getting insurance, however, rx cost is over $300 for 15 days.   Please advise if we can provide patient with sample of Xarelto or if there is medication assistance that can be provided for patient.   To PCP and Dr Raymondo Band.   Veronda Prude, RN

## 2020-07-03 NOTE — Telephone Encounter (Signed)
Contacted patient and discussed specific needs.  He has adequate Xarelto supply to last until Monday.    I agreed to provide 2 weeks of Samples to cover his need to complete treatment course.  He plans to pick-up on Monday 9/20.   Medication Samples have been provided to the patient.  Drug name: Xarelto (rivaroxaban)       Strength: 20mg         Qty: #14  LOT Exp.Date: 09/17/3020  Dosing instructions: one daily with evening meal  The patient has been instructed regarding the correct time, dose, and frequency of taking this medication, including desired effects and most common side effects.   09/19/3020 12:13 PM 07/03/2020   Sample stored with nurses on [place] until planned Monday Pick-up as I will potentially be out of the office.

## 2020-07-03 NOTE — Telephone Encounter (Signed)
Patient returns call to nurse line with questions regarding blood sugars and insulin pen. Patient reports elevated blood sugar readings this morning (167) and this afternoon (213). Patient has questions regarding the correct administration with the fiasp pen. Directed question to Dr. Raymondo Band. Transferred patient at this time.   Patient also has questions regarding pain management. Patient reports being told not to take ibuprofen due to being on Xarelto. Patient is asking if tramadol would be an option for pain management.   To PCP  Please advise  Veronda Prude, RN

## 2020-07-04 ENCOUNTER — Telehealth: Payer: Self-pay

## 2020-07-04 ENCOUNTER — Ambulatory Visit: Payer: Self-pay | Attending: Internal Medicine

## 2020-07-04 DIAGNOSIS — Z23 Encounter for immunization: Secondary | ICD-10-CM

## 2020-07-04 NOTE — Progress Notes (Signed)
   Covid-19 Vaccination Clinic  Name:  Sean Carter    MRN: [id number] DOB: 12/22/1970  07/04/2020  Mr. Aument was observed post Covid-19 immunization for 30 minutes based on pre-vaccination screening without incident. He was provided with Vaccine Information Sheet and instruction to access the V-Safe system.   Mr. Macphee was instructed to call 911 with any severe reactions post vaccine: Marland Kitchen Difficulty breathing  . Swelling of face and throat  . A fast heartbeat  . A bad rash all over body  . Dizziness and weakness   Immunizations Administered    Name Date Dose VIS Date Route   Moderna COVID-19 Vaccine 07/04/2020  9:28 AM 0.5 mL 09/2019 Intramuscular   Manufacturer: Moderna   Lot: [id number]   NDC: 24268-341-96

## 2020-07-04 NOTE — Telephone Encounter (Signed)
Patient's girlfriend presents to front office to pick up medication samples. Spoke with Dr. Raymondo Band and dispensed as directed.   Veronda Prude, RN

## 2020-07-04 NOTE — Telephone Encounter (Signed)
Patient called in requesting to know what medication he can take at night for pain due to his Great toe amp. After getting recommendations from [facility], PA-C I advised patient he is ok to take 1000mg  of Tylenol every 3 hours PRN. I advised patient that if his sxs become worst to call the office for additional recommendations. Patient voiced his understanding.

## 2020-07-04 NOTE — Telephone Encounter (Signed)
Patient returned call to nurse line and informed of below information.   Veronda Prude, RN

## 2020-07-08 ENCOUNTER — Ambulatory Visit: Payer: Self-pay | Admitting: Infectious Diseases

## 2020-07-08 ENCOUNTER — Telehealth: Payer: Self-pay | Admitting: Pharmacist

## 2020-07-08 NOTE — Telephone Encounter (Signed)
Patient called me directly to ask for additional FIASP pen as he had a dysfunctional pen.    I agreed.   Medication Samples have been provided for this patient to pick-up.   Drug name: Fiasp (insulin Aspart)       Strength: 100units/ml        Qty: 2 pens  LOT: [id number]  Exp.Date: 01/16/2022  Dosing instructions: as previously prescribed - no change.   The patient has been instructed regarding the correct time, dose, and frequency of taking this medication, including desired effects and most common side effects.   Madelon Lips 10:17 AM 07/08/2020   Items labeled and placed in refrigerator. Anticipate pick-up 9/21

## 2020-07-09 ENCOUNTER — Telehealth: Payer: Self-pay

## 2020-07-09 NOTE — Telephone Encounter (Signed)
COVID-19 Pre-Screening Questions:07/09/20 ° °Do you currently have a fever (>100 °F), chills or unexplained body aches?NO ° °Are you currently experiencing new cough, shortness of breath, sore throat, runny nose? NO °•  °Have you been in contact with someone that is currently pending confirmation of Covid19 testing or has been confirmed to have the Covid19 virus? NO  ° °**If the patient answers NO to ALL questions -  advise the patient to please call the clinic before coming to the office should any symptoms develop.  ° ° °

## 2020-07-10 ENCOUNTER — Other Ambulatory Visit: Payer: Self-pay

## 2020-07-10 ENCOUNTER — Encounter: Payer: Self-pay | Admitting: Infectious Disease

## 2020-07-10 ENCOUNTER — Ambulatory Visit (INDEPENDENT_AMBULATORY_CARE_PROVIDER_SITE_OTHER): Payer: Self-pay | Admitting: Infectious Disease

## 2020-07-10 VITALS — BP 184/110 | HR 87 | Temp 98.9°F | Ht 68.0 in | Wt 191.0 lb

## 2020-07-10 DIAGNOSIS — E1151 Type 2 diabetes mellitus with diabetic peripheral angiopathy without gangrene: Secondary | ICD-10-CM

## 2020-07-10 DIAGNOSIS — H538 Other visual disturbances: Secondary | ICD-10-CM

## 2020-07-10 DIAGNOSIS — R7881 Bacteremia: Secondary | ICD-10-CM

## 2020-07-10 DIAGNOSIS — B955 Unspecified streptococcus as the cause of diseases classified elsewhere: Secondary | ICD-10-CM

## 2020-07-10 DIAGNOSIS — I96 Gangrene, not elsewhere classified: Secondary | ICD-10-CM

## 2020-07-10 DIAGNOSIS — I10 Essential (primary) hypertension: Secondary | ICD-10-CM

## 2020-07-10 DIAGNOSIS — A401 Sepsis due to streptococcus, group B: Secondary | ICD-10-CM

## 2020-07-10 DIAGNOSIS — E1165 Type 2 diabetes mellitus with hyperglycemia: Secondary | ICD-10-CM

## 2020-07-10 DIAGNOSIS — S98112A Complete traumatic amputation of left great toe, initial encounter: Secondary | ICD-10-CM

## 2020-07-10 DIAGNOSIS — E1159 Type 2 diabetes mellitus with other circulatory complications: Secondary | ICD-10-CM

## 2020-07-10 HISTORY — DX: Other visual disturbances: H53.8

## 2020-07-10 NOTE — Telephone Encounter (Signed)
Patient presents to front desk requesting to pick up sample. Obtained sample from refrigerator. Verified name and birth date and dispensed to patient.   Veronda Prude, RN

## 2020-07-10 NOTE — Progress Notes (Signed)
Subjective:    Patie chief complaint: Blurry vision and follow-up for diabetic foot infection nt ID: Sean Carter, male    DOB: 24-Dec-1970, 49 y.o.   MRN: [id number]  HPI  49 y.o.malewith diabetes who presented for care after 2 week history of worsening left great toe pain. He was found to have necrotizing fasciitis infection of the left great toe and proceeded with amputation. He had secondary bacteremia due to group b streptococcus at the time as well. TTE was negative. He was found later to have a superficial thrombophlebitis on left as well. He was treated with IV ceftriaxone and then keflex   I connected to him via telephone last visit which appeared to be doing relatively well.  And then I arrange for his visit today.  Is been following closely with vascular surgery.  His wound has healthy granulation tissue.  Today when we remove the dressing actually bled fairly vigorously.  He also continues to have a wound on his calf where he had phlebitis.  His blood pressure but high in the clinic in this distress time I suggest that he get a ambulatory blood pressure cuff.  He did also tell me that he has been suffering from blurry vision which she correlates with his Inc. proved glycemic control.  He apparently tried to see an optometrist but they would not allow him and his wife to come in together he does require her assistance to get to visits.    Past Medical History:  Diagnosis Date  . Diabetes mellitus without complication ([name])   . Gangrene of toe of left foot ([name])   . Sepsis due to Streptococcus, group B ([name]) 06/20/2020    Past Surgical History:  Procedure Laterality Date  . AMPUTATION Left 05/31/2020   Procedure: Ray Amputation of Left Great Toe with Negative Pressure Vac Placement;  Surgeon: Cephus Shelling, MD;  Location: [facility] OR;  Service: Vascular;  Laterality: Left;    No family history on file.    Social History   Socioeconomic History  . Marital  status: Married    Spouse name: Not on file  . Number of children: Not on file  . Years of education: Not on file  . Highest education level: Not on file  Occupational History  . Not on file  Tobacco Use  . Smoking status: Never Smoker  . Smokeless tobacco: Never Used  Vaping Use  . Vaping Use: Never used  Substance and Sexual Activity  . Alcohol use: Not on file  . Drug use: Never  . Sexual activity: Not on file  Other Topics Concern  . Not on file  Social History Narrative  . Not on file   Social Determinants of Health   Financial Resource Strain:   . Difficulty of Paying Living Expenses: Not on file  Food Insecurity:   . Worried About Programme researcher, broadcasting/film/video in the Last Year: Not on file  . Ran Out of Food in the Last Year: Not on file  Transportation Needs:   . Lack of Transportation (Medical): Not on file  . Lack of Transportation (Non-Medical): Not on file  Physical Activity:   . Days of Exercise per Week: Not on file  . Minutes of Exercise per Session: Not on file  Stress:   . Feeling of Stress : Not on file  Social Connections:   . Frequency of Communication with Friends and Family: Not on file  . Frequency of Social Gatherings with Friends and  Family: Not on file  . Attends Religious Services: Not on file  . Active Member of Clubs or Organizations: Not on file  . Attends Banker Meetings: Not on file  . Marital Status: Not on file    Allergies  Allergen Reactions  . Scallops [Shellfish Allergy] Anaphylaxis     Current Outpatient Medications:  .  atorvastatin (LIPITOR) 40 MG tablet, Take 1 tablet (40 mg total) by mouth daily., Disp: 90 tablet, Rfl: 0 .  Blood Gluc Meter Disp-Strips (RELION ALL-IN-ONE) DEVI, 1 each by Does not apply route 3 (three) times daily as needed., Disp: 1 each, Rfl: 3 .  diphenhydrAMINE (BENADRYL) 25 MG tablet, Take 25 mg by mouth at bedtime., Disp: , Rfl:  .  diphenhydramine-acetaminophen (TYLENOL PM) 25-500 MG TABS  tablet, Take 1 tablet by mouth at bedtime as needed., Disp: , Rfl:  .  insulin aspart (FIASP FLEXTOUCH) 100 UNIT/ML FlexTouch Pen, Inject 8 Units into the skin 3 (three) times daily before meals., Disp: , Rfl:  .  insulin glargine (LANTUS) 100 UNIT/ML injection, Inject 0.26 mLs (26 Units total) into the skin daily. (Patient taking differently: Inject 30 Units into the skin daily. ), Disp: 10 mL, Rfl:  .  losartan (COZAAR) 25 MG tablet, Take 0.5 tablets (12.5 mg total) by mouth daily. (Patient taking differently: Take 25 mg by mouth daily. ), Disp: 45 tablet, Rfl: 0 .  metFORMIN (GLUCOPHAGE) 500 MG tablet, Take 1 tablet (500 mg total) by mouth 2 (two) times daily with a meal., Disp: 180 tablet, Rfl: 0 .  Multiple Vitamin (MULTIVITAMIN) tablet, Take 1 tablet by mouth daily., Disp: , Rfl:  .  pseudoephedrine (SUDAFED) 30 MG tablet, Take 30 mg by mouth daily., Disp: , Rfl:  .  ReliOn Alcohol Swabs PADS, 1 each by Does not apply route 3 (three) times daily as needed., Disp: 100 each, Rfl: 3 .  ReliOn Lancet Devices 30G MISC, 1 each by Does not apply route 3 (three) times daily as needed., Disp: 1 each, Rfl: 3 .  ReliOn Lancets Thin 26G MISC, 1 each by Does not apply route 3 (three) times daily as needed., Disp: 1 each, Rfl: 3 .  rivaroxaban (XARELTO) 20 MG TABS tablet, Take 1 tablet (20 mg total) by mouth daily with supper for 15 days. With a meal, Disp: 15 tablet, Rfl: 0 .  ULTICARE INSULIN SYRINGE 30G X 1/2" 0.3 ML MISC, See admin instructions. , Disp: , Rfl:  .  oxyCODONE (ROXICODONE) 5 MG immediate release tablet, Take 1 tablet (5 mg total) by mouth every 6 (six) hours as needed for severe pain. (Patient not taking: Reported on 07/02/2020), Disp: 15 tablet, Rfl: 0 .  polyethylene glycol (MIRALAX) 17 g packet, Take 17 g by mouth daily. (Patient not taking: Reported on 07/10/2020), Disp: 14 each, Rfl: 0 .  senna (SENOKOT) 8.6 MG TABS tablet, Take 1 tablet (8.6 mg total) by mouth daily as needed for mild  constipation. (Patient not taking: Reported on 07/10/2020), Disp: 120 tablet, Rfl: 0   Review of Systems  Constitutional: Negative for activity change, appetite change, chills, diaphoresis, fatigue, fever and unexpected weight change.  HENT: Negative for congestion, rhinorrhea, sinus pressure, sneezing, sore throat and trouble swallowing.   Eyes: Positive for visual disturbance. Negative for photophobia.  Respiratory: Negative for cough, chest tightness, shortness of breath, wheezing and stridor.   Cardiovascular: Negative for chest pain, palpitations and leg swelling.  Gastrointestinal: Negative for abdominal distention, abdominal pain, anal bleeding, blood  in stool, constipation, diarrhea, nausea and vomiting.  Genitourinary: Negative for difficulty urinating, dysuria, flank pain and hematuria.  Musculoskeletal: Negative for arthralgias, back pain, gait problem, joint swelling and myalgias.  Skin: Positive for wound. Negative for color change, pallor and rash.  Neurological: Negative for dizziness, tremors, weakness and light-headedness.  Hematological: Negative for adenopathy. Does not bruise/bleed easily.  Psychiatric/Behavioral: Negative for agitation, behavioral problems, confusion, decreased concentration, dysphoric mood and sleep disturbance.       Objective:   Physical Exam Constitutional:      Appearance: He is well-developed.  HENT:     Head: Normocephalic and atraumatic.  Eyes:     Conjunctiva/sclera: Conjunctivae normal.  Cardiovascular:     Rate and Rhythm: Normal rate and regular rhythm.  Pulmonary:     Effort: Pulmonary effort is normal. No respiratory distress.     Breath sounds: No wheezing.  Abdominal:     General: There is no distension.     Palpations: Abdomen is soft.  Musculoskeletal:        General: No tenderness. Normal range of motion.     Cervical back: Normal range of motion and neck supple.  Skin:    General: Skin is warm and dry.     Coloration:  Skin is not pale.     Findings: No erythema or rash.  Neurological:     General: No focal deficit present.     Mental Status: He is alert and oriented to person, place, and time.  Psychiatric:        Mood and Affect: Mood normal.        Behavior: Behavior normal.        Thought Content: Thought content normal.        Judgment: Judgment normal.   Amputation site pictured today July 10, 2020:    Site of phlebitis to get pictured again July 10, 2020:            Assessment & Plan:   necrotizing fasciitis with gangrene and osteomyelitis with streptococcal bacteremia status post amputation by vascular surgery:  We will check surveillance blood cultures sed rate CRP metabolic panel and CBC with differential.  Wound appears to be healing well at blood Vickers today but he is on a blood thinner.    Area of phlebitis this seems to be healing up as well.  Hypertension defer to PCP and suggested ambulatory blood pressure cuff.  Diabetes mellitus he is improving his glycemic control.  Blurry vision I have made a referral to ophthalmology given his multiple risk factors for retinal disease.

## 2020-07-12 ENCOUNTER — Telehealth: Payer: Self-pay

## 2020-07-12 DIAGNOSIS — E1159 Type 2 diabetes mellitus with other circulatory complications: Secondary | ICD-10-CM

## 2020-07-12 NOTE — Telephone Encounter (Signed)
Patient calls nurse line regarding concerns for BP. Patient reports elevated readings ranging from 150s-180s over 80. Patient reports most recent BP 188/80, taken by PT this morning. Patient is asymptomatic and reports good medication compliance.   Patient does not currently have active insurance and would like to manage BP at home if possible. Advised patient of return/ ED precautions.   Please advise   Veronda Prude, RN

## 2020-07-15 MED ORDER — HYDROCHLOROTHIAZIDE 12.5 MG PO CAPS: 12.5000 mg | ORAL_CAPSULE | Freq: Every day | ORAL | 0 refills | Status: DC

## 2020-07-16 ENCOUNTER — Ambulatory Visit: Payer: Self-pay | Admitting: Family Medicine

## 2020-07-16 LAB — CBC WITH DIFFERENTIAL/PLATELET
Absolute Monocytes: 383 cells/uL (ref 200–950)
Basophils Absolute: 38 cells/uL (ref 0–200)
Basophils Relative: 0.5 %
Eosinophils Absolute: 233 cells/uL (ref 15–500)
Eosinophils Relative: 3.1 %
HCT: 38.1 % — ABNORMAL LOW (ref 38.5–50.0)
Hemoglobin: 12.4 g/dL — ABNORMAL LOW (ref 13.2–17.1)
Lymphs Abs: 2033 cells/uL (ref 850–3900)
MCH: 27.6 pg (ref 27.0–33.0)
MCHC: 32.5 g/dL (ref 32.0–36.0)
MCV: 84.9 fL (ref 80.0–100.0)
MPV: 10.2 fL (ref 7.5–12.5)
Monocytes Relative: 5.1 %
Neutro Abs: 4815 cells/uL (ref 1500–7800)
Neutrophils Relative %: 64.2 %
Platelets: 357 10*3/uL (ref 140–400)
RBC: 4.49 10*6/uL (ref 4.20–5.80)
RDW: 13.7 % (ref 11.0–15.0)
Total Lymphocyte: 27.1 %
WBC: 7.5 10*3/uL (ref 3.8–10.8)

## 2020-07-16 LAB — BASIC METABOLIC PANEL WITH GFR
BUN: 14 mg/dL (ref 7–25)
CO2: 29 mmol/L (ref 20–32)
Calcium: 9.8 mg/dL (ref 8.6–10.3)
Chloride: 99 mmol/L (ref 98–110)
Creat: 0.78 mg/dL (ref 0.60–1.35)
GFR, Est African American: 123 mL/min/{1.73_m2} (ref >=60)
GFR, Est Non African American: 106 mL/min/{1.73_m2} (ref >=60)
Glucose, Bld: 115 mg/dL — ABNORMAL HIGH (ref 65–99)
Potassium: 4.2 mmol/L (ref 3.5–5.3)
Sodium: 140 mmol/L (ref 135–146)

## 2020-07-16 LAB — CULTURE, BLOOD (SINGLE)
MICRO NUMBER:: 10983662
MICRO NUMBER:: 10983663

## 2020-07-16 LAB — SEDIMENTATION RATE: Sed Rate: 29 mm/h — ABNORMAL HIGH (ref 0–15)

## 2020-07-16 LAB — C-REACTIVE PROTEIN: CRP: 4.1 mg/L (ref <8.0)

## 2020-07-16 NOTE — Telephone Encounter (Signed)
Patient called and informed of below information. Patient has scheduled appointment on 10/11 for follow up.   Strict return precautions given.   Veronda Prude, RN

## 2020-07-18 ENCOUNTER — Telehealth: Payer: Self-pay

## 2020-07-18 NOTE — Telephone Encounter (Signed)
Labs reassuring. Can discuss again at next appt

## 2020-07-18 NOTE — Telephone Encounter (Signed)
Patient called office to review labs that were done on 9/22. Will forward message to MD to review labs and call patient back with providers response. Lorenso Courier, [place]

## 2020-07-18 NOTE — Telephone Encounter (Signed)
Patient calls nurse line with concerns for side effects of HCTZ. Patient reports his pharmacist warned him this medication causes low potassium. Advised patient we checked potassium at last visit and we can monitor. Patient has an apt coming up in October. Advised patient to resume his BP medication regime.

## 2020-07-19 NOTE — Telephone Encounter (Signed)
Called patient to relay message to MD. Does not have any questions at this time. Will follow up as needed. Lorenso Courier, [place]

## 2020-07-23 ENCOUNTER — Encounter: Payer: Self-pay | Admitting: Vascular Surgery

## 2020-07-23 ENCOUNTER — Ambulatory Visit (INDEPENDENT_AMBULATORY_CARE_PROVIDER_SITE_OTHER): Payer: Self-pay | Admitting: Vascular Surgery

## 2020-07-23 ENCOUNTER — Other Ambulatory Visit: Payer: Self-pay

## 2020-07-23 ENCOUNTER — Telehealth: Payer: Self-pay

## 2020-07-23 VITALS — BP 191/102 | HR 80 | Temp 98.1°F | Resp 16 | Ht 68.0 in | Wt 191.0 lb

## 2020-07-23 DIAGNOSIS — S98112A Complete traumatic amputation of left great toe, initial encounter: Secondary | ICD-10-CM

## 2020-07-23 MED ORDER — COLLAGENASE 250 UNIT/GM EX OINT: 1.0000 "application " | TOPICAL_OINTMENT | Freq: Every day | CUTANEOUS | 1 refills | Status: DC

## 2020-07-23 MED ORDER — TRAMADOL HCL 50 MG PO TABS: 50.0000 mg | ORAL_TABLET | Freq: Two times a day (BID) | ORAL | 0 refills | Status: DC | PRN

## 2020-07-23 NOTE — Progress Notes (Signed)
Patient name: Sean Carter MRN: [id number] DOB: 1970/12/01 Sex: male  REASON FOR VISIT: Continued post-op check after left great toe ray amputation  HPI: Sean Carter is a 49 y.o. male with history of diabetes that presents for ongoing postop check after left great toe ray amputation on 05/31/2020.  Initially came to the ED with sepsis and had gangrene of his left great toe and then underwent ray amptuation.  He has been getting wet-to-dry dressings at home and his wife has been changing the wound.  Continuing wet-to-dry dressing changes without issue.  Having pain in the toe at night.  No fevers or other foul drainage.  Asked about when he can stop the Xarelto that was started for superficial thrombophlebitis in the left GSV for 45 days on 06/07/20.  Past Medical History:  Diagnosis Date  . Blurry vision 07/10/2020  . Diabetes mellitus without complication ([name])   . Gangrene of toe of left foot ([name])   . Sepsis due to Streptococcus, group B ([name]) 06/20/2020    Past Surgical History:  Procedure Laterality Date  . AMPUTATION Left 05/31/2020   Procedure: Ray Amputation of Left Great Toe with Negative Pressure Vac Placement;  Surgeon: Cephus Shelling, MD;  Location: [facility] OR;  Service: Vascular;  Laterality: Left;    History reviewed. No pertinent family history.  SOCIAL HISTORY: Social History   Tobacco Use  . Smoking status: Never Smoker  . Smokeless tobacco: Never Used  Substance Use Topics  . Alcohol use: Not on file    Allergies  Allergen Reactions  . Scallops [Shellfish Allergy] Anaphylaxis    Current Outpatient Medications  Medication Sig Dispense Refill  . atorvastatin (LIPITOR) 40 MG tablet Take 1 tablet (40 mg total) by mouth daily. 90 tablet 0  . Blood Gluc Meter Disp-Strips (RELION ALL-IN-ONE) DEVI 1 each by Does not apply route 3 (three) times daily as needed. 1 each 3  . diphenhydrAMINE (BENADRYL) 25 MG tablet Take 25 mg by mouth at bedtime.    .  diphenhydramine-acetaminophen (TYLENOL PM) 25-500 MG TABS tablet Take 1 tablet by mouth at bedtime as needed.    . hydrochlorothiazide (MICROZIDE) 12.5 MG capsule Take 1 capsule (12.5 mg total) by mouth daily. 30 capsule 0  . insulin aspart (FIASP FLEXTOUCH) 100 UNIT/ML FlexTouch Pen Inject 8 Units into the skin 3 (three) times daily before meals.    . insulin glargine (LANTUS) 100 UNIT/ML injection Inject 0.26 mLs (26 Units total) into the skin daily. (Patient taking differently: Inject 30 Units into the skin daily. ) 10 mL   . losartan (COZAAR) 25 MG tablet Take 0.5 tablets (12.5 mg total) by mouth daily. (Patient taking differently: Take 25 mg by mouth daily. ) 45 tablet 0  . metFORMIN (GLUCOPHAGE) 500 MG tablet Take 1 tablet (500 mg total) by mouth 2 (two) times daily with a meal. 180 tablet 0  . Multiple Vitamin (MULTIVITAMIN) tablet Take 1 tablet by mouth daily.    . pseudoephedrine (SUDAFED) 30 MG tablet Take 30 mg by mouth daily.    . ReliOn Alcohol Swabs PADS 1 each by Does not apply route 3 (three) times daily as needed. 100 each 3  . ReliOn Lancet Devices 30G MISC 1 each by Does not apply route 3 (three) times daily as needed. 1 each 3  . ReliOn Lancets Thin 26G MISC 1 each by Does not apply route 3 (three) times daily as needed. 1 each 3  . ULTICARE INSULIN SYRINGE 30G  X 1/2" 0.3 ML MISC See admin instructions.     Marland Kitchen oxyCODONE (ROXICODONE) 5 MG immediate release tablet Take 1 tablet (5 mg total) by mouth every 6 (six) hours as needed for severe pain. (Patient not taking: Reported on 07/23/2020) 15 tablet 0  . polyethylene glycol (MIRALAX) 17 g packet Take 17 g by mouth daily. (Patient not taking: Reported on 07/10/2020) 14 each 0  . rivaroxaban (XARELTO) 20 MG TABS tablet Take 1 tablet (20 mg total) by mouth daily with supper for 15 days. With a meal 15 tablet 0  . senna (SENOKOT) 8.6 MG TABS tablet Take 1 tablet (8.6 mg total) by mouth daily as needed for mild constipation. (Patient not  taking: Reported on 07/10/2020) 120 tablet 0   No current facility-administered medications for this visit.    REVIEW OF SYSTEMS:  [X]  denotes positive finding, [ ]  denotes negative finding Cardiac  Comments:  Chest pain or chest pressure:    Shortness of breath upon exertion:    Short of breath when lying flat:    Irregular heart rhythm:        Vascular    Pain in calf, thigh, or hip brought on by ambulation:    Pain in feet at night that wakes you up from your sleep:     Blood clot in your veins:    Leg swelling:         Pulmonary    Oxygen at home:    Productive cough:     Wheezing:         Neurologic    Sudden weakness in arms or legs:     Sudden numbness in arms or legs:     Sudden onset of difficulty speaking or slurred speech:    Temporary loss of vision in one eye:     Problems with dizziness:         Gastrointestinal    Blood in stool:     Vomited blood:         Genitourinary    Burning when urinating:     Blood in urine:        Psychiatric    Major depression:         Hematologic    Bleeding problems:    Problems with blood clotting too easily:        Skin    Rashes or ulcers:        Constitutional    Fever or chills:      PHYSICAL EXAM: Vitals:   07/23/20 0923 07/23/20 0939  BP: (!) 184/118 (!) 191/102  Pulse: 87 80  Resp: 16   Temp: 98.1 F (36.7 C)   TempSrc: Temporal   SpO2: 100%   Weight: 191 lb (86.6 kg)   Height: 5\' 8"  (1.727 m)     GENERAL: The patient is a well-nourished male, in no acute distress. The vital signs are documented above. CARDIAC: There is a regular rate and rhythm.  VASCULAR:  Left DP palpable Left great toe ray amputation seen below with good granulation tissue, no abnormal drainage New wound on left medial calf pictured below with eschar          DATA:   None  Assessment/Plan:  49 year old male with diabetes that presents for ongoing postop check after ray amputation of the left great toe on  05/31/2020.  Overall the wound looks good and recommend continue wet-to-dry dressing changes daily to the left great toe.  In addition given this  new eschar over a wound on the medial calf I have prescribed Santyl for this calf wound and instructed his wife to apply Santyl daily to this.  He asked about stopping his Xarelto that was prescribed in the hospital for long segment superficial thrombophlebitis in the left leg and discussed since it has been more than 45 days he can stop it from my standpoint.  I will have him follow-up again in 1 month for wound check.  I did send some tramadol to his pharmacy given he is having some pain at night after dressing changes.  We discussed going back to work but will wait one more month to see how the wound continues to progress unless he can do some light duty.  Cephus Shelling, MD [facility] Office: [phone]

## 2020-07-23 NOTE — Telephone Encounter (Signed)
Patient calls nurse line regarding questions with medications. Patient states that he has been taking Sudafed every day for a while now. Questioned patient as to reason for taking and he was unsure. Patient continues to have elevated BP, with most recent 180/90. Patient reports that he was told at Dr. Ophelia Charter office that [facility] could be contributing to elevated BP readings. Please advise if patient should stop Sudafed.   Patient also has questions regarding length of Xarelto. Patient was informed by Dr. Chestine Spore that he would be okay with him stopping the medication, however, provider asked patient to follow up with PCP for further instruction for d/c of medication.   Strict ED precautions given  Veronda Prude, RN

## 2020-07-24 ENCOUNTER — Telehealth: Payer: Self-pay

## 2020-07-24 NOTE — Telephone Encounter (Signed)
Spoke with pt about note below. Pt stated that he stopped the sudafed today. And that he wouldn't take the xarelto until he found out who the doctor was that prescribed the medication or if he even needed it anymore? He also stated that when he comes in Monday to see Dr. Rachael Darby he would ask about the cream that was Rx to him to help the yellow spot on his leg from his clot. Cause it cost to much money and wanted to know if there was something else he could use. Aquilla Solian, CMA

## 2020-07-24 NOTE — Telephone Encounter (Signed)
Patient called to request help with Santyl RX - it is $400 - $250 with insurance. Directed patient to [facility] copay website and provided 1800 number for Santyl patient assistance program. Patient will call back if RX is still unaffordable.

## 2020-07-25 ENCOUNTER — Telehealth: Payer: Self-pay

## 2020-07-25 NOTE — Telephone Encounter (Signed)
F/U with patient, medication is still unaffordable with savings card. Discussed with PA and recommend wet to dry dressing changes BID. Patient is agreeable and verbalized understanding.

## 2020-07-29 ENCOUNTER — Encounter: Payer: Self-pay | Admitting: Family Medicine

## 2020-07-29 ENCOUNTER — Other Ambulatory Visit: Payer: Self-pay

## 2020-07-29 ENCOUNTER — Ambulatory Visit (INDEPENDENT_AMBULATORY_CARE_PROVIDER_SITE_OTHER): Payer: Self-pay | Admitting: Family Medicine

## 2020-07-29 VITALS — BP 170/80 | HR 101 | Ht 68.0 in | Wt 193.0 lb

## 2020-07-29 DIAGNOSIS — E1159 Type 2 diabetes mellitus with other circulatory complications: Secondary | ICD-10-CM

## 2020-07-29 DIAGNOSIS — E1151 Type 2 diabetes mellitus with diabetic peripheral angiopathy without gangrene: Secondary | ICD-10-CM

## 2020-07-29 DIAGNOSIS — I152 Hypertension secondary to endocrine disorders: Secondary | ICD-10-CM

## 2020-07-29 MED ORDER — LOSARTAN POTASSIUM 50 MG PO TABS: 50.0000 mg | ORAL_TABLET | Freq: Every day | ORAL | 3 refills | Status: DC

## 2020-07-29 NOTE — Assessment & Plan Note (Addendum)
Uncontrolled. BP 180/90 and recheck 170/80. Advised pt to get BP cuff.   - Increase Losartan 50 mg. New Rx sent to pharmacy - Increase HZTC (hydrochlorthiazide) to 25 mg (2-12.5mg  tablets).  -Goal <130 / <80  - Pt to call with BP log in 2 weeks  - Follow up in clinic in  the clinic and1 month

## 2020-07-29 NOTE — Progress Notes (Signed)
   SUBJECTIVE:   CHIEF COMPLAINT / HPI:   Chief Complaint  Patient presents with  . Follow-up     Sean Carter is a 49 y.o. male here for follow.   HTN Patient has had several high blood pressures with PT and specialist visits recently. Systolic BP 160s-200. He has stopped using Sudafed. Does not yet have a BP cuff at home.  Denies missing doses of antihypertensive medications. Denies chest pain, palpitations, new lower extremity edema, exertional dyspnea, lightheadedness and vision changes.        PERTINENT  PMH / PSH: reviewed and updated as appropriate   OBJECTIVE:   BP (!) 180/90   Pulse (!) 101   Wt 193 lb (87.5 kg)   SpO2 99%   BMI 29.35 kg/m    Re-check:  170/80  GEN: well developed male in no acute distress  CV: regular rate and rhythm, no murmurs  RESP: no increased work of breathing, clear to ascultation bilaterally  MSK: no LE edema  SKIN: warm, dry, healing superficial LLE wound 2/2 superficial venous thrombosis    ASSESSMENT/PLAN:   Hypertension associated with diabetes ([name]) Uncontrolled. BP 180/90 and recheck 170/80. Advised pt to get BP cuff.   - Increase Losartan 50 mg. New Rx sent to pharmacy - Increase HZTC (hydrochlorthiazide) to 25 mg (2-12.5mg  tablets).  -Goal <130 / <80  - Pt to call with BP log in 2 weeks  - Follow up in clinic in  the clinic and1 month     Katha Cabal, DO PGY-2, [facility] 07/29/2020

## 2020-07-29 NOTE — Patient Instructions (Addendum)
It was great seeing you today!   I'd like to see you back 1 month in clinic but if you need to be seen earlier than that for any new issues we're happy to fit you in, just give [place] a call!  - Increase Losartan 50 mg. You can take 2-25mg  tablets.  A new (50 mg) prescription was sent to your pharmacy  - Increase HZTC (hydrochlorthiazide) to 25 mg (2-12.5mg  tablets).  - After a week if blood pressures are still elevated >140 / >90  - Call the clinic and ask for instructions in 2 weeks     If you have questions or concerns please do not hesitate to call at [phone].  Dr. Katherina Right [facility]

## 2020-07-31 ENCOUNTER — Other Ambulatory Visit: Payer: Self-pay | Admitting: Family Medicine

## 2020-07-31 ENCOUNTER — Telehealth: Payer: Self-pay

## 2020-07-31 DIAGNOSIS — E1151 Type 2 diabetes mellitus with diabetic peripheral angiopathy without gangrene: Secondary | ICD-10-CM

## 2020-07-31 MED ORDER — FIASP FLEXTOUCH 100 UNIT/ML ~~LOC~~ SOPN: 8.0000 [IU] | PEN_INJECTOR | Freq: Three times a day (TID) | SUBCUTANEOUS | Status: DC

## 2020-07-31 NOTE — Telephone Encounter (Signed)
Patient calls nurse line requesting pen needles for Fiasp to be sent to [facility] in [place].   Patient requesting sample Fiasp pens from our office.   Please advise.

## 2020-07-31 NOTE — Progress Notes (Signed)
Refilled Fiasp flex touch pens.

## 2020-08-01 ENCOUNTER — Telehealth: Payer: Self-pay | Admitting: Pharmacist

## 2020-08-01 DIAGNOSIS — E1165 Type 2 diabetes mellitus with hyperglycemia: Secondary | ICD-10-CM

## 2020-08-01 NOTE — Telephone Encounter (Signed)
Contacted patient regarding diabetes management. Patient reports having enough Fiasp for the next 3 days and is requesting additional needle tips. Reports medication adherence with Fiasp 8 units TID w/meals and Lantus 26-30 units daily. Reports fasting sugars 77-125 mg/dL with a one-time low of 58 which he did not eat  enough carbs. Patient verbalized appropriate hypoglycemia management. Patient and family member will pick up samples today at 5PM.  Medication Samples have been provided to the patient.  Drug name: Candie Mile     Strength: 100 units/ml        Qty: 5 LOT: [id number] Exp.Date: 01/05/2022  Dosing instructions: Inject 8 units subcutaneously three times daily with meals  The patient has been instructed regarding the correct time, dose, and frequency of taking this medication, including desired effects and most common side effects.   Fabio Neighbors, PharmD PGY2 Ambulatory Care Resident [facility]   Pharmacy

## 2020-08-01 NOTE — Telephone Encounter (Signed)
Samples given to patient  

## 2020-08-05 ENCOUNTER — Other Ambulatory Visit: Payer: Self-pay

## 2020-08-05 DIAGNOSIS — I152 Hypertension secondary to endocrine disorders: Secondary | ICD-10-CM

## 2020-08-05 DIAGNOSIS — E1159 Type 2 diabetes mellitus with other circulatory complications: Secondary | ICD-10-CM

## 2020-08-05 MED ORDER — HYDROCHLOROTHIAZIDE 25 MG PO TABS: 25.0000 mg | ORAL_TABLET | Freq: Every day | ORAL | 3 refills | Status: DC

## 2020-08-05 NOTE — Telephone Encounter (Signed)
Patient calls nurse line requesting refill on HCTZ. Patient reports that medication was increased from 1 to 2 pills daily at last OV. Patient is requesting 3 month supply on medication, as this is most cost effective.   To PCP  Please advise  Veronda Prude, RN

## 2020-08-09 ENCOUNTER — Other Ambulatory Visit: Payer: Self-pay | Admitting: Physician Assistant

## 2020-08-09 MED ORDER — TRAMADOL HCL 50 MG PO TABS: 50.0000 mg | ORAL_TABLET | Freq: Four times a day (QID) | ORAL | 0 refills | Status: DC | PRN

## 2020-08-12 LAB — HM DIABETES EYE EXAM

## 2020-08-14 ENCOUNTER — Telehealth: Payer: Self-pay

## 2020-08-14 NOTE — Telephone Encounter (Addendum)
Patient calls nurse line reporting a week of blood pressures. Patient reports compliance with Losartan and HCTZ. Patient reports the BPs below are BEFORE he takes his medications. I advised patient to call us back in a few days with readings after medication has been taken.  10/18: 142/94 10/19: 169/102 10/20: 152/96 10/21: 129/87 10/22: 127/85 10/25: 146/90 10/26: 142/93 10/27: 132/91

## 2020-08-26 ENCOUNTER — Telehealth: Payer: Self-pay

## 2020-08-26 NOTE — Telephone Encounter (Signed)
Noted and agree. 

## 2020-08-26 NOTE — Telephone Encounter (Signed)
Contacted by patient requesting insulin samples as he does not have enough to last until his PCP visit with Dr. Rachael Darby in the next 1-2 weeks.    He reports excellent blood sugar control with majority of readings 110-125.  His current insulin regimen is: Lanuts 26 units once daily.  Fiasp 8 units prior to meals 3x daily.   Medication Samples have been provided for the patient to pick-up 08/27/2020  Drug name: Fiasp (insulin aspart)       Strength: 100units/ml        Qty: 4 pens LOT: [id number]      Exp.Date: 01/16/2022 Dosing instructions: Continue same: 8 units     Drug name: Lantus       Strength: 100units/ml        Qty: 1  LOT: [id number]  Exp.Date: 11/18/2021 Drug name: Lantus       Strength: 100units/ml        Qty: 2  LOT: [id number]  Exp.Date: 02/15/2021 Dosing instructions: 26 units once daily  The patient has been instructed regarding the correct time, dose, and frequency of taking this medication, including desired effects and most common side effects.   Madelon Lips 11:25 AM 08/26/2020  Dr. Rachael Darby,  At your next visit, please consider the following.  If medication coverage remains problematic (lack of insurance) at PCP visit, please let Mardee Postin know, so that we can follow-up and attempt to transition to [employer] support program.

## 2020-08-26 NOTE — Telephone Encounter (Signed)
Patient calls nurse line requesting medication samples for Lantus and Fiasp. Patient reports he was given some Fiasp pens already, however he reports (2) of the pens do not work. Will forward to [place] to see if appropriate.

## 2020-08-27 ENCOUNTER — Ambulatory Visit (INDEPENDENT_AMBULATORY_CARE_PROVIDER_SITE_OTHER): Payer: Self-pay | Admitting: Vascular Surgery

## 2020-08-27 ENCOUNTER — Other Ambulatory Visit: Payer: Self-pay

## 2020-08-27 ENCOUNTER — Other Ambulatory Visit: Payer: Self-pay | Admitting: Vascular Surgery

## 2020-08-27 VITALS — BP 166/114 | HR 83 | Temp 97.3°F | Resp 18 | Ht 68.0 in | Wt 192.0 lb

## 2020-08-27 DIAGNOSIS — S98112A Complete traumatic amputation of left great toe, initial encounter: Secondary | ICD-10-CM

## 2020-08-27 MED ORDER — TRAMADOL HCL 50 MG PO TABS: 50.0000 mg | ORAL_TABLET | Freq: Four times a day (QID) | ORAL | 0 refills | Status: DC | PRN

## 2020-08-27 NOTE — Progress Notes (Signed)
Patient name: Sean Carter MRN: [id number] DOB: 28-Jun-1971 Sex: male  REASON FOR VISIT: Continued post-op check after left great toe ray amputation  HPI: Sean Carter is a 49 y.o. male with history of diabetes that presents for ongoing postop check after left great toe ray amputation on 05/31/2020.  Initially came to the ED with sepsis and had gangrene of his left great toe and then underwent ray amptuation.  He has been getting wet-to-dry dressings at home and his wife has been changing the wound.  Continuing wet-to-dry dressing changes without issue.  No fevers or other foul drainage.  He was also on Xarelto for 45 days for long segment superficial thrombophlebitis in the left GSV.  We have been watching a new wound on the left medial calf over this area of the GSV.  Past Medical History:  Diagnosis Date  . Blurry vision 07/10/2020  . Diabetes mellitus without complication ([name])   . Gangrene of toe of left foot ([name])   . Sepsis due to Streptococcus, group B ([name]) 06/20/2020    Past Surgical History:  Procedure Laterality Date  . AMPUTATION Left 05/31/2020   Procedure: Ray Amputation of Left Great Toe with Negative Pressure Vac Placement;  Surgeon: Cephus Shelling, MD;  Location: [facility];  Service: Vascular;  Laterality: Left;    No family history on file.  SOCIAL HISTORY: Social History   Tobacco Use  . Smoking status: Never Smoker  . Smokeless tobacco: Never Used  Substance Use Topics  . Alcohol use: Not on file    Allergies  Allergen Reactions  . Scallops [Shellfish Allergy] Anaphylaxis    Current Outpatient Medications  Medication Sig Dispense Refill  . atorvastatin (LIPITOR) 40 MG tablet Take 1 tablet (40 mg total) by mouth daily. 90 tablet 0  . Blood Gluc Meter Disp-Strips (RELION ALL-IN-ONE) DEVI 1 each by Does not apply route 3 (three) times daily as needed. 1 each 3  . collagenase (SANTYL) ointment Apply 1 application topically daily. 30 g 1  . diphenhydrAMINE  (BENADRYL) 25 MG tablet Take 25 mg by mouth at bedtime.    . diphenhydramine-acetaminophen (TYLENOL PM) 25-500 MG TABS tablet Take 1 tablet by mouth at bedtime as needed.    . hydrochlorothiazide (HYDRODIURIL) 25 MG tablet Take 1 tablet (25 mg total) by mouth daily. 90 tablet 3  . insulin aspart (FIASP FLEXTOUCH) 100 UNIT/ML FlexTouch Pen Inject 8 Units into the skin 3 (three) times daily before meals.    . insulin glargine (LANTUS) 100 UNIT/ML injection Inject 0.26 mLs (26 Units total) into the skin daily. (Patient taking differently: Inject 30 Units into the skin daily. ) 10 mL   . losartan (COZAAR) 50 MG tablet Take 1 tablet (50 mg total) by mouth daily. 90 tablet 3  . metFORMIN (GLUCOPHAGE) 500 MG tablet Take 1 tablet (500 mg total) by mouth 2 (two) times daily with a meal. 180 tablet 0  . Multiple Vitamin (MULTIVITAMIN) tablet Take 1 tablet by mouth daily.    . ReliOn Alcohol Swabs PADS 1 each by Does not apply route 3 (three) times daily as needed. 100 each 3  . ReliOn Lancet Devices 30G MISC 1 each by Does not apply route 3 (three) times daily as needed. 1 each 3  . ReliOn Lancets Thin 26G MISC 1 each by Does not apply route 3 (three) times daily as needed. 1 each 3  . traMADol (ULTRAM) 50 MG tablet Take 1 tablet (50 mg total) by  mouth every 6 (six) hours as needed for severe pain. 16 tablet 0  . ULTICARE INSULIN SYRINGE 30G X 1/2" 0.3 ML MISC See admin instructions.     Marland Kitchen oxyCODONE (ROXICODONE) 5 MG immediate release tablet Take 1 tablet (5 mg total) by mouth every 6 (six) hours as needed for severe pain. (Patient not taking: Reported on 07/23/2020) 15 tablet 0  . polyethylene glycol (MIRALAX) 17 g packet Take 17 g by mouth daily. (Patient not taking: Reported on 07/10/2020) 14 each 0  . pseudoephedrine (SUDAFED) 30 MG tablet Take 30 mg by mouth daily. (Patient not taking: Reported on 08/27/2020)    . rivaroxaban (XARELTO) 20 MG TABS tablet Take 1 tablet (20 mg total) by mouth daily with supper  for 15 days. With a meal 15 tablet 0  . senna (SENOKOT) 8.6 MG TABS tablet Take 1 tablet (8.6 mg total) by mouth daily as needed for mild constipation. (Patient not taking: Reported on 07/10/2020) 120 tablet 0   No current facility-administered medications for this visit.    REVIEW OF SYSTEMS:  [X]  denotes positive finding, [ ]  denotes negative finding Cardiac  Comments:  Chest pain or chest pressure:    Shortness of breath upon exertion:    Short of breath when lying flat:    Irregular heart rhythm:        Vascular    Pain in calf, thigh, or hip brought on by ambulation:    Pain in feet at night that wakes you up from your sleep:     Blood clot in your veins:    Leg swelling:         Pulmonary    Oxygen at home:    Productive cough:     Wheezing:         Neurologic    Sudden weakness in arms or legs:     Sudden numbness in arms or legs:     Sudden onset of difficulty speaking or slurred speech:    Temporary loss of vision in one eye:     Problems with dizziness:         Gastrointestinal    Blood in stool:     Vomited blood:         Genitourinary    Burning when urinating:     Blood in urine:        Psychiatric    Major depression:         Hematologic    Bleeding problems:    Problems with blood clotting too easily:        Skin    Rashes or ulcers:        Constitutional    Fever or chills:      PHYSICAL EXAM: Vitals:   08/27/20 0853  BP: (!) 166/114  Pulse: 83  Resp: 18  Temp: (!) 97.3 F (36.3 C)  TempSrc: Temporal  SpO2: 100%  Weight: 192 lb (87.1 kg)  Height: 5\' 8"  (1.727 m)    GENERAL: The patient is a well-nourished male, in no acute distress.    VASCULAR:  Left PT palpable Left great toe ray amputation seen below with good granulation tissue and continues to heal Wound on left medial calf pictured below with eschar - debrided in clinic           DATA:   None  Assessment/Plan:  49 year old male with diabetes that presents  for ongoing postop check after ray amputation of the left great toe on 05/31/2020.  This wound continues to heal and I recommend he continue wet-to-dry dressing daily.  I think this is making excellent progress.  The eschar over his left medial calf is likely from long segment superficial thrombophlebitis.  I previously prescribed Santyl but he unfortunately could not afford this.  We previously stopped his Xarelto after he had 45 days of anticoagulation for long segment superficial thrombophlebitis in the left GSV.  I was able to debride this wound in clinic today as pictured above and I have instructed him to put wet-to-dry dressings on this as well.  Follow-up in 1 month for continued wound check.   Cephus Shelling, MD [facility] Office: [phone]   Cephus Shelling

## 2020-08-27 NOTE — Addendum Note (Signed)
Addended by: Cephus Shelling on: 08/27/2020 10:09 AM   Modules accepted: Orders

## 2020-09-03 ENCOUNTER — Telehealth: Payer: Self-pay | Admitting: Family Medicine

## 2020-09-03 NOTE — Telephone Encounter (Signed)
Called patient to discuss possible RD visit with Dr. Gerilyn Pilgrim this Thursday at 9 AM. LVM.  Katha Cabal, DO PGY-2, [facility] 09/03/2020 5:41 PM

## 2020-09-06 ENCOUNTER — Encounter: Payer: Self-pay | Admitting: Family Medicine

## 2020-09-06 ENCOUNTER — Ambulatory Visit (INDEPENDENT_AMBULATORY_CARE_PROVIDER_SITE_OTHER): Payer: Self-pay | Admitting: Family Medicine

## 2020-09-06 ENCOUNTER — Other Ambulatory Visit: Payer: Self-pay

## 2020-09-06 VITALS — BP 182/98 | HR 82 | Ht 68.0 in | Wt 197.0 lb

## 2020-09-06 DIAGNOSIS — E1159 Type 2 diabetes mellitus with other circulatory complications: Secondary | ICD-10-CM

## 2020-09-06 DIAGNOSIS — I152 Hypertension secondary to endocrine disorders: Secondary | ICD-10-CM

## 2020-09-06 DIAGNOSIS — E1151 Type 2 diabetes mellitus with diabetic peripheral angiopathy without gangrene: Secondary | ICD-10-CM

## 2020-09-06 LAB — POCT GLYCOSYLATED HEMOGLOBIN (HGB A1C): Hemoglobin A1C: 5.5 % (ref 4.0–5.6)

## 2020-09-06 MED ORDER — LOSARTAN POTASSIUM 100 MG PO TABS: 100.0000 mg | ORAL_TABLET | Freq: Every day | ORAL | 3 refills | Status: DC

## 2020-09-06 NOTE — Progress Notes (Signed)
   SUBJECTIVE:   CHIEF COMPLAINT / HPI:   Chief Complaint  Patient presents with  . Diabetes     Sean Carter is a 49 y.o. male here for follow up.    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  His wife reports he is doing much better at counting carbs.  Patient states that he is very mindful on the number of carbohydrates he is eating because he does not want additional complications related to diabetes.  Has been monitoring his blood sugars which have been ranging 100-120.  Has been taking Metformin, Lantus and aspart as prescribed.  Has not yet gotten insurance.  Denies hypoglycemic events.  HTN Systolic blood pressures have been 120s to 160s.  Patient is taking losartan and HCTZ as prescribed.  Denies shortness of breath, chest pain, new lower extremity edema, dyspnea on exertion.    PERTINENT  PMH / PSH:  reviewed and updated as appropriate   OBJECTIVE:   BP (!) 182/98   Pulse 82   Ht 5\' 8"  (1.727 m)   Wt 197 lb (89.4 kg)   SpO2 97%   BMI 29.95 kg/m   Repeat BP in right arm : 164/82   (patient's BP machine)152/88    GEN: pleasant well appearing male, in no acute distress CV: regular rate and rhythm, no murmurs appreciated RESP: no increased work of breathing, clear to ascultation bilaterally  MSK: wearing surgical boot on left, no ambulation assistive devices present SKIN: warm, dry NEURO: alert and oriented  PSYCH: appropriate speech and behavior    ASSESSMENT/PLAN:   Hypertension associated with diabetes ([name]) Uncontrolled. BP 182/98 and recheck 164/82 .  Patient obtain BP cuff. - Increase Losartan 100 mg. New Rx sent to pharmacy -Continue HZTC (hydrochlorthiazide) to 25 mg -Goal <120 / <80  - Pt to call with BP log in 2 weeks  - Follow up BMP in 1 week   Diabetes mellitus type 2 with peripheral artery disease ([name]) Vastly improved. Continue current regimen: Metformin, Lantus and Aspart. Pt could begin to back down on  insulin given his a1c today.  Previous a1c 11.3 and today is 5.5. Home blood sugars at at goal. Encouraged continued diet rich in vegetables and complex carbs.  Heart healthy carb modified diet. Counseled on need to continue exercising.   Statin therapy: crestor ACEi/ARB: Losartan Foot exam: needs  Urine microalbumine: needs Eye exam: patient has scheduled appt   As patient has not yet obtained insurance will refer pt for . Follow up in 3 months. Patient to call with CBGs in 2 weeks.      [facility], DO PGY-2, [facility] 09/06/2020

## 2020-09-06 NOTE — Patient Instructions (Signed)
It was great seeing you today!  Please check-out at the front desk before leaving the clinic. I'd like to see you back in 3 months but if you need to be seen earlier than that for any new issues we're happy to fit you in, just give [place] a call!  Visit Remembers: - Stop by the pharmacy to pick up your prescriptions  - Continue to work on your healthy eating habits and incorporating exercise into your daily life. (see below) - Your goal is to have an A1c < 7 - Medicine Changes: Increased Losartan to 100 mg (2-50 mg tablets). A new 100 mg tablet was sent to your pharmacy.  - To Do:   In the next 2 weeks call the office with your blood pressure log.      Diet Recommendations for Diabetes  Carbohydrate includes starch, sugar, and fiber.  Of these, only sugar and starch raise blood glucose.  (Fiber is found in fruits, vegetables [especially skin, seeds, and stalks] and whole grains.)   Starchy (carb) foods: Bread, rice, pasta, potatoes, corn, cereal, grits, crackers, bagels, muffins, all baked goods.  (Fruit, milk, and yogurt also have carbohydrate, but most of these foods will not spike your blood sugar as most starchy foods will.)  A few fruits do cause high blood sugars; use small portions of bananas (limit to 1/2 at a time), grapes, watermelon, oranges, and most tropical fruits.   Protein foods: Meat, fish, poultry, eggs, dairy foods, and beans such as pinto and kidney beans (beans also provide carbohydrate).   1. Eat at least REAL 3 meals and 1-2 snacks per day. Never go more than 4-5 hours while awake without eating. Eat breakfast within the first hour of getting up.   2. Limit starchy foods to TWO per meal and ONE per snack. ONE portion of a starchy food is equal to the following:   - ONE slice of bread (or its equivalent, such as half of a hamburger bun).   - 1/2 cup of a "scoopable" starchy food such as potatoes or rice.   - 15 grams of Total Carbohydrate as shown on food label.   -  Every 4 ounces of a sweet drink (including fruit juice). 3. Include at every meal: a protein food, a carb food, and vegetables and/or fruit.   - Obtain twice the volume of veg's as protein or carbohydrate foods for both lunch and dinner.   - Fresh or frozen veg's are best.   - Keep frozen veg's on hand for a quick vegetable serving.       Regarding lab work today:  Due to recent changes in healthcare laws, you may see the results of your imaging and laboratory studies on MyChart before your doctor has had a chance to review them.  We understand that in some cases there may be results that are confusing or concerning to you. Not all laboratory results come back in the same time frame and your doctor may be waiting for multiple results in order to interpret others.  Please give [place] 72 hours in order for your doctor to thoroughly review all the results before contacting the office for clarification of your results. If everything is normal, you will get a letter in the mail or a message in My Chart. Please give [place] a call if you do not hear from [place] after 2 weeks.  Please bring all of your medications with you to each visit.    If you haven't already,  sign up for My Chart to have easy access to your labs results, and communication with your primary care physician.  Feel free to call with any questions or concerns at any time, at [phone].   Take care,  Dr. Katherina Right [facility]

## 2020-09-08 NOTE — Assessment & Plan Note (Signed)
Vastly improved. Continue current regimen: Metformin, Lantus and Aspart. Pt could begin to back down on insulin given his a1c today.  Previous a1c 11.3 and today is 5.5. Home blood sugars at at goal. Encouraged continued diet rich in vegetables and complex carbs.  Heart healthy carb modified diet. Counseled on need to continue exercising.   Statin therapy: crestor ACEi/ARB: Losartan Foot exam: needs  Urine microalbumine: needs Eye exam: patient has scheduled appt   As patient has not yet obtained insurance will refer pt for [employer]. Follow up in 3 months. Patient to call with CBGs in 2 weeks.

## 2020-09-08 NOTE — Assessment & Plan Note (Addendum)
Uncontrolled. BP 182/98 and recheck 164/82 .  Patient obtain BP cuff. - Increase Losartan 100 mg. New Rx sent to pharmacy -Continue HZTC (hydrochlorthiazide) to 25 mg -Goal <120 / <80  - Pt to call with BP log in 2 weeks  - Follow up BMP in 1 week

## 2020-09-17 ENCOUNTER — Telehealth: Payer: Self-pay | Admitting: Pharmacist

## 2020-09-17 NOTE — Telephone Encounter (Signed)
Phone call from patient requesting Lantus insulin sample.    Patient reports blood sugar control is excellent with use of Lantus 26 units daily.  He reports that he has completed paperwork for insulin with Mardee Postin to obtain insulin supply from [employer].    I clarified that his insulin glargine would change names from Lantus to Basaglar with the change in manufacturer.  He anticipates supply arriving in the next 2 weeks.   Medication Samples have been provided for the patient to pick-up on 09/18/2020. (labeled and placed in refrigerator).   Drug name: Lantus (insulin glargine)       Strength: 100units per ml        Qty: 1 pen  LOT: [id number]  Exp.Date: 11/18/2021  Dosing instructions:  Continue 26 units once daily.   The patient has been instructed regarding the correct time, dose, and frequency of taking this medication, including desired effects and most common side effects.   Madelon Lips 10:11 AM 09/17/2020

## 2020-09-19 NOTE — Telephone Encounter (Signed)
Mailed [employer] application to patient today with instructions for patient to complete and return paperwork to office at earliest convenience.

## 2020-09-24 ENCOUNTER — Other Ambulatory Visit: Payer: Self-pay

## 2020-09-24 ENCOUNTER — Encounter: Payer: Self-pay | Admitting: Vascular Surgery

## 2020-09-24 ENCOUNTER — Ambulatory Visit (INDEPENDENT_AMBULATORY_CARE_PROVIDER_SITE_OTHER): Payer: Self-pay | Admitting: Vascular Surgery

## 2020-09-24 VITALS — BP 154/102 | HR 78 | Temp 98.2°F | Resp 18 | Ht 68.0 in | Wt 194.0 lb

## 2020-09-24 DIAGNOSIS — S98112A Complete traumatic amputation of left great toe, initial encounter: Secondary | ICD-10-CM

## 2020-09-24 NOTE — Progress Notes (Signed)
Patient name: EZRAH PANNING MRN: [id number] DOB: December 21, 1970 Sex: male  REASON FOR VISIT: Continued post-op check after left great toe ray amputation  HPI: RABON SCHOLLE is a 49 y.o. male with history of diabetes that presents for ongoing postop check after left great toe ray amputation on 05/31/2020.  Initially came to the ED with sepsis and had gangrene of his left great toe and then underwent ray amptuation.  He has been getting wet-to-dry dressings at home and his wife has been changing the wound.  Continuing wet-to-dry dressing changes without issue.  He was also on Xarelto for 45 days for long segment superficial thrombophlebitis in the left GSV.  He was last seen one month ago.  The great toe amputation has now healed.  No drainage.  Also has scabbed over wound on left calf where he had superficial thrombophlebitis that is healing nicely and his wife was doing wet to dry packing.  Past Medical History:  Diagnosis Date  . Blurry vision 07/10/2020  . Diabetes mellitus without complication ([name])   . Gangrene of toe of left foot ([name])   . Sepsis due to Streptococcus, group B ([name]) 06/20/2020    Past Surgical History:  Procedure Laterality Date  . AMPUTATION Left 05/31/2020   Procedure: Ray Amputation of Left Great Toe with Negative Pressure Vac Placement;  Surgeon: Cephus Shelling, MD;  Location: [facility] OR;  Service: Vascular;  Laterality: Left;    History reviewed. No pertinent family history.  SOCIAL HISTORY: Social History   Tobacco Use  . Smoking status: Never Smoker  . Smokeless tobacco: Never Used  Substance Use Topics  . Alcohol use: Not on file    Allergies  Allergen Reactions  . Scallops [Shellfish Allergy] Anaphylaxis    Current Outpatient Medications  Medication Sig Dispense Refill  . Blood Gluc Meter Disp-Strips (RELION ALL-IN-ONE) DEVI 1 each by Does not apply route 3 (three) times daily as needed. 1 each 3  . collagenase (SANTYL) ointment Apply 1 application  topically daily. 30 g 1  . diphenhydrAMINE (BENADRYL) 25 MG tablet Take 25 mg by mouth at bedtime.    . diphenhydramine-acetaminophen (TYLENOL PM) 25-500 MG TABS tablet Take 1 tablet by mouth at bedtime as needed.    . hydrochlorothiazide (HYDRODIURIL) 25 MG tablet Take 1 tablet (25 mg total) by mouth daily. 90 tablet 3  . insulin aspart (FIASP FLEXTOUCH) 100 UNIT/ML FlexTouch Pen Inject 8 Units into the skin 3 (three) times daily before meals.    . insulin glargine (LANTUS) 100 UNIT/ML injection Inject 0.26 mLs (26 Units total) into the skin daily. (Patient taking differently: Inject 30 Units into the skin daily. ) 10 mL   . losartan (COZAAR) 100 MG tablet Take 1 tablet (100 mg total) by mouth at bedtime. 90 tablet 3  . Multiple Vitamin (MULTIVITAMIN) tablet Take 1 tablet by mouth daily.    . polyethylene glycol (MIRALAX) 17 g packet Take 17 g by mouth daily. 14 each 0  . ReliOn Alcohol Swabs PADS 1 each by Does not apply route 3 (three) times daily as needed. 100 each 3  . ReliOn Lancet Devices 30G MISC 1 each by Does not apply route 3 (three) times daily as needed. 1 each 3  . ReliOn Lancets Thin 26G MISC 1 each by Does not apply route 3 (three) times daily as needed. 1 each 3  . senna (SENOKOT) 8.6 MG TABS tablet Take 1 tablet (8.6 mg total) by mouth daily as needed  for mild constipation. 120 tablet 0  . traMADol (ULTRAM) 50 MG tablet Take 1 tablet (50 mg total) by mouth every 6 (six) hours as needed for severe pain. 30 tablet 0  . ULTICARE INSULIN SYRINGE 30G X 1/2" 0.3 ML MISC See admin instructions.     Marland Kitchen atorvastatin (LIPITOR) 40 MG tablet Take 1 tablet (40 mg total) by mouth daily. 90 tablet 0  . metFORMIN (GLUCOPHAGE) 500 MG tablet Take 1 tablet (500 mg total) by mouth 2 (two) times daily with a meal. 180 tablet 0   No current facility-administered medications for this visit.    REVIEW OF SYSTEMS:  [X]  denotes positive finding, [ ]  denotes negative finding Cardiac  Comments:  Chest  pain or chest pressure:    Shortness of breath upon exertion:    Short of breath when lying flat:    Irregular heart rhythm:        Vascular    Pain in calf, thigh, or hip brought on by ambulation:    Pain in feet at night that wakes you up from your sleep:     Blood clot in your veins:    Leg swelling:         Pulmonary    Oxygen at home:    Productive cough:     Wheezing:         Neurologic    Sudden weakness in arms or legs:     Sudden numbness in arms or legs:     Sudden onset of difficulty speaking or slurred speech:    Temporary loss of vision in one eye:     Problems with dizziness:         Gastrointestinal    Blood in stool:     Vomited blood:         Genitourinary    Burning when urinating:     Blood in urine:        Psychiatric    Major depression:         Hematologic    Bleeding problems:    Problems with blood clotting too easily:        Skin    Rashes or ulcers:        Constitutional    Fever or chills:      PHYSICAL EXAM: Vitals:   09/24/20 0932  BP: (!) 154/102  Pulse: 78  Resp: 18  Temp: 98.2 F (36.8 C)  TempSrc: Temporal  SpO2: 98%  Weight: 194 lb (88 kg)  Height: 5\' 8"  (1.727 m)      GENERAL: The patient is a well-nourished male, in no acute distress.    VASCULAR:  Left PT palpable Left great toe ray amputation seen above - no whealed Wound on left medial calf pictured now scabbed over and healing   DATA:   None  Assessment/Plan:  49 year old male with diabetes that presents for ongoing postop check after ray amputation of the left great toe on 05/31/2020.  This has now completely healed.  I do not think he needs any further wound care here.  The area on the proximal calf has also scabbed over.  He can now return to regular shoe.  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  He can follow-up with me as needed.  ,  MD [facility] Office: [phone]  Cephus Shelling

## 2020-09-24 NOTE — Telephone Encounter (Signed)
Patient presents to front desk for medication samples. Provided patient with samples after verifying name and birth date.   Veronda Prude, RN

## 2020-09-27 ENCOUNTER — Other Ambulatory Visit: Payer: Self-pay

## 2020-09-27 DIAGNOSIS — E785 Hyperlipidemia, unspecified: Secondary | ICD-10-CM

## 2020-09-27 DIAGNOSIS — E1151 Type 2 diabetes mellitus with diabetic peripheral angiopathy without gangrene: Secondary | ICD-10-CM

## 2020-09-27 MED ORDER — METFORMIN HCL 500 MG PO TABS: 500.0000 mg | ORAL_TABLET | Freq: Two times a day (BID) | ORAL | 0 refills | Status: DC

## 2020-09-27 MED ORDER — ATORVASTATIN CALCIUM 40 MG PO TABS: 40.0000 mg | ORAL_TABLET | Freq: Every day | ORAL | 0 refills | Status: DC

## 2020-10-04 ENCOUNTER — Telehealth: Payer: Self-pay

## 2020-10-04 DIAGNOSIS — E1165 Type 2 diabetes mellitus with hyperglycemia: Secondary | ICD-10-CM

## 2020-10-04 MED ORDER — RELION LANCETS THIN 26G MISC: 1.0000 | Freq: Three times a day (TID) | 3 refills | Status: DC | PRN

## 2020-10-04 NOTE — Telephone Encounter (Signed)
Called patient and LVM that Lantus sample has been left in the refrigerator.   Medication Samples have been provided to the patient.  Drug name: Lantus       Strength: 100 units        Qty: 1 pen  LOT: [id number]  Exp.Date: 11/18/2021  Dosing instructions: Inject 26 units into the skin daily.   The patient has been instructed regarding the correct time, dose, and frequency of taking this medication, including desired effects and most common side effects.   Atha Starks Shanee Batch 4:09 PM 10/04/2020

## 2020-10-04 NOTE — Telephone Encounter (Signed)
Patient calls nurse line to report BP readings over the last week 11/29: 135/93 12/1: 132/86 12/2: 132/93 12/6: 142-91 12/7: 143/93 12/8: 130/89 12/11: 127/87 12/15: 137/89  Patient is requesting another sample of lantus. Patient reports that he has applied for a program to receive insulin for free that will be shipped to his home. However, this program will not start until the first of the year. Patient states that he is almost out of lantus. Please advise if patient can receive this sample.   Patient is also requesting refill of lancets. Will refill per protocol.   Veronda Prude, RN

## 2020-10-04 NOTE — Telephone Encounter (Signed)
Reviewed BP log.  Continue antihypertensive as prescribed. Regarding Lantus. Patient can have sample as he is un-insured.   Katha Cabal, DO PGY-2, [facility] 10/04/2020 3:57 PM

## 2020-10-14 ENCOUNTER — Telehealth: Payer: Self-pay

## 2020-10-14 NOTE — Telephone Encounter (Signed)
Patient calls nurse line requesting samples of fiasp pens and lantus. Patient reports having approx. 2 days left for both medications.   Patient states that new insurance should take effect in mid January.  Will forward to Dr. Raymondo Band and Dr. Rachael Darby.   Please advise  Veronda Prude, RN

## 2020-10-16 NOTE — Telephone Encounter (Signed)
Forwarding to pharmacy team to determine how many samples patient should receive based on current sample supply.   Veronda Prude, RN

## 2020-10-16 NOTE — Telephone Encounter (Signed)
Contacted patient RE need for insulin supply.   Verified doses of  Lantus 26 units once daily.  Fiasp 8 units prior to meals  Denied hypoglycemia and reports continued excellent control.   Medication Samples have been provided to the patient.  Drug name: Fiasp (insulin aspart)       Strength: 100units/ml        Qty: 2 pens  LOT: [id number]  Exp.Date: 01/16/2022  Dosing instructions: 8 units TID [facility]    Drug name: Lantus       Strength: 100units / ml        Qty: 2 pens  LOT: [id number]  Exp.Date: 01/16/2022  Dosing instructions: 26 units once daily  The patient has been instructed regarding the correct time, dose, and frequency of taking this medication, including desired effects and most common side effects.   Madelon Lips 10:31 AM 10/16/2020  Patient will pick-up tomorrow 12/30

## 2020-10-21 NOTE — Telephone Encounter (Signed)
Noted and agree. 

## 2020-11-05 ENCOUNTER — Telehealth: Payer: Self-pay

## 2020-11-05 NOTE — Telephone Encounter (Signed)
Patient calls nurse line requesting samples of Lantus and Fiasp. Patient reports his insurance has not gone into effect yet. Please advise on samples. Patient does report he filled out a medication assistance form for his insulin, however has not heard from anyone. Patient reports he "mailed it off almost 1.5 weeks ago." Will forward to [place].

## 2020-11-06 ENCOUNTER — Telehealth: Payer: Self-pay | Admitting: Student in an Organized Health Care Education/Training Program

## 2020-11-06 NOTE — Telephone Encounter (Signed)
**  After Hours/ Emergency Line Call*  Had his am lantus and last novolog at lunch time. Unable to pick up his refill novolog until Friday morning. He has enough lantus to last him. He has 70/30 and wants to know how much to use. Most recent measured126 BS Continue 30u lantus every morning.  Monitor blood sugars AC & QHS. If BS >300, can take additional 5 units of 70/30 once.  Pick up regular insulin regimen ASAP. Agreed that running a little high in short term was better than risking hypoglycemia.

## 2020-11-07 MED ORDER — TRESIBA FLEXTOUCH 200 UNIT/ML ~~LOC~~ SOPN: 26.0000 [IU] | PEN_INJECTOR | Freq: Every day | SUBCUTANEOUS | 0 refills | Status: DC

## 2020-11-07 NOTE — Telephone Encounter (Signed)
Contacted patient who reports that he has minimal Lantus remaining and is out of [place].  He states that he will have health insurance coverage soon.    As we do not have any Lantus samples at this time, we agreed to switch his basal insulin from Lantus to [place].  He denies any low readings.  We agreed to switch to the 26 units of Tresiba.  No change in Fiasp insulin dose.  Continue 8 units prior to each meal.    Medication Samples have been provided for the patient.  Drug name: Evaristo Bury (insulin degludec)       Strength: 200units/ml        Qty: 2 pens  LOT: [id number]  Exp.Date: 03/18/2022  Dosing instructions: Take 26 units once daily   Drug name: Fiasp (insulin aspart)       Strength: 100units/ml        Qty: 2 pens  LOT: [id number]  Exp.Date: 06/18/2022  Dosing instructions: 8 units prior to meals  The patient has been instructed regarding the correct time, dose, and frequency of taking this medication, including desired effects and most common side effects.   Sean Carter 1:38 PM 11/07/2020  Patient plans to pick up 1/21 Samples in refrigerator.

## 2020-11-07 NOTE — Telephone Encounter (Signed)
Noted and agree. 

## 2020-11-19 IMAGING — DX DG TIBIA/FIBULA 2V*L*
4 series · 4 of 4 positions shown · non-contrast
Comparison: None.

CLINICAL DATA: Possible infection after toe surgery

EXAM:
LEFT TIBIA AND FIBULA - 2 VIEW

[tibia ap (1 of 2)]
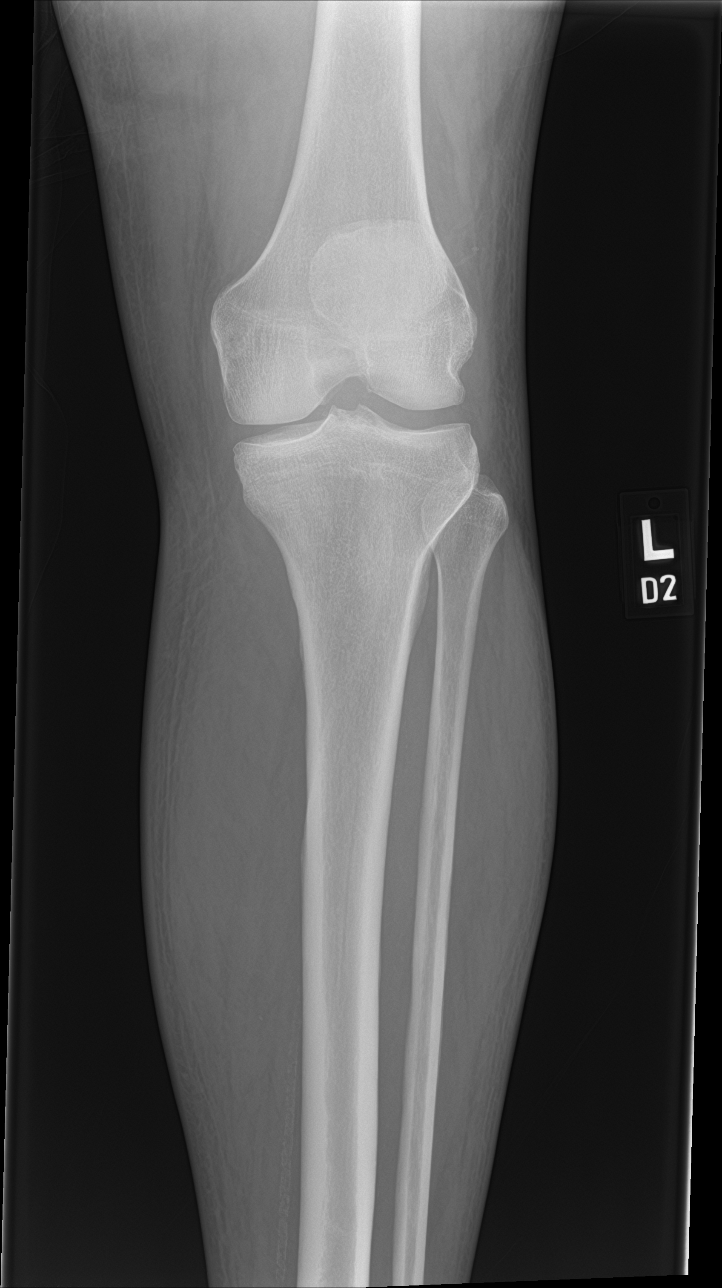

[tibia ap (2 of 2)]
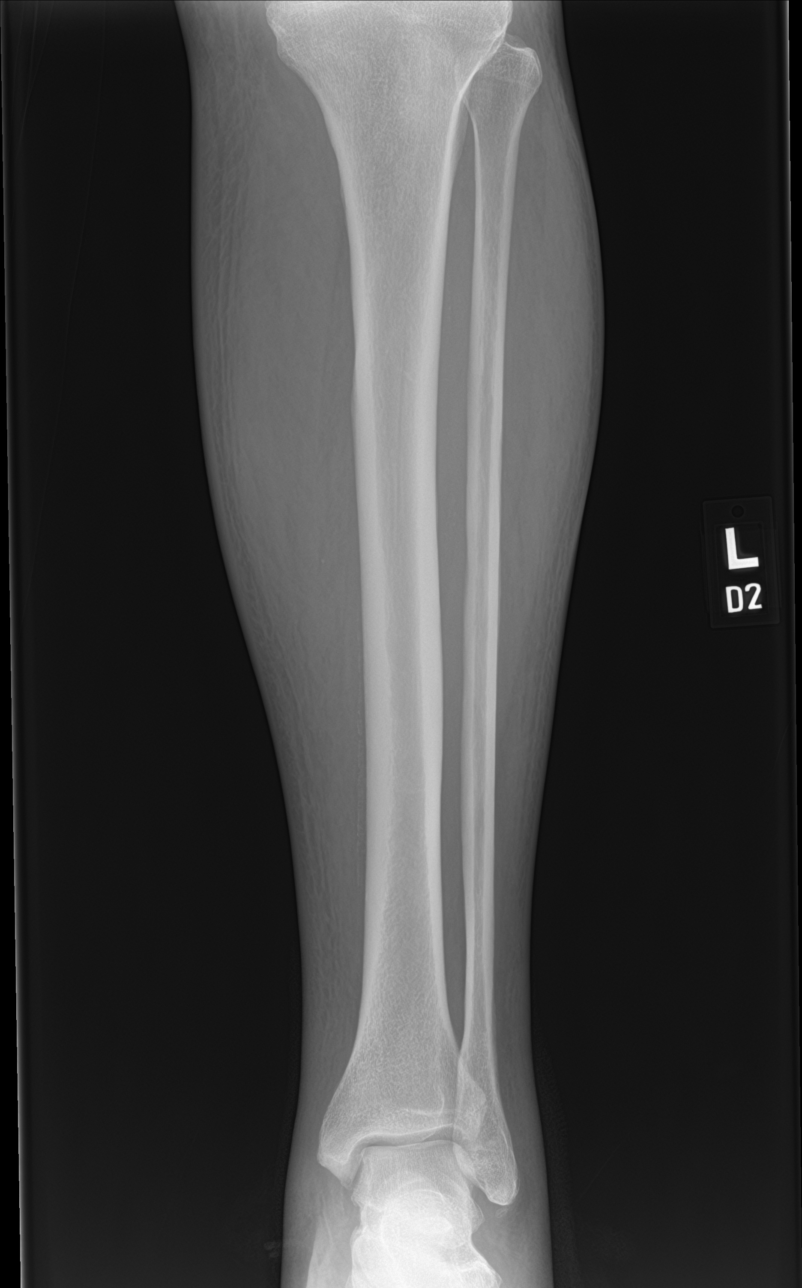

[tibia lat (1 of 2)]
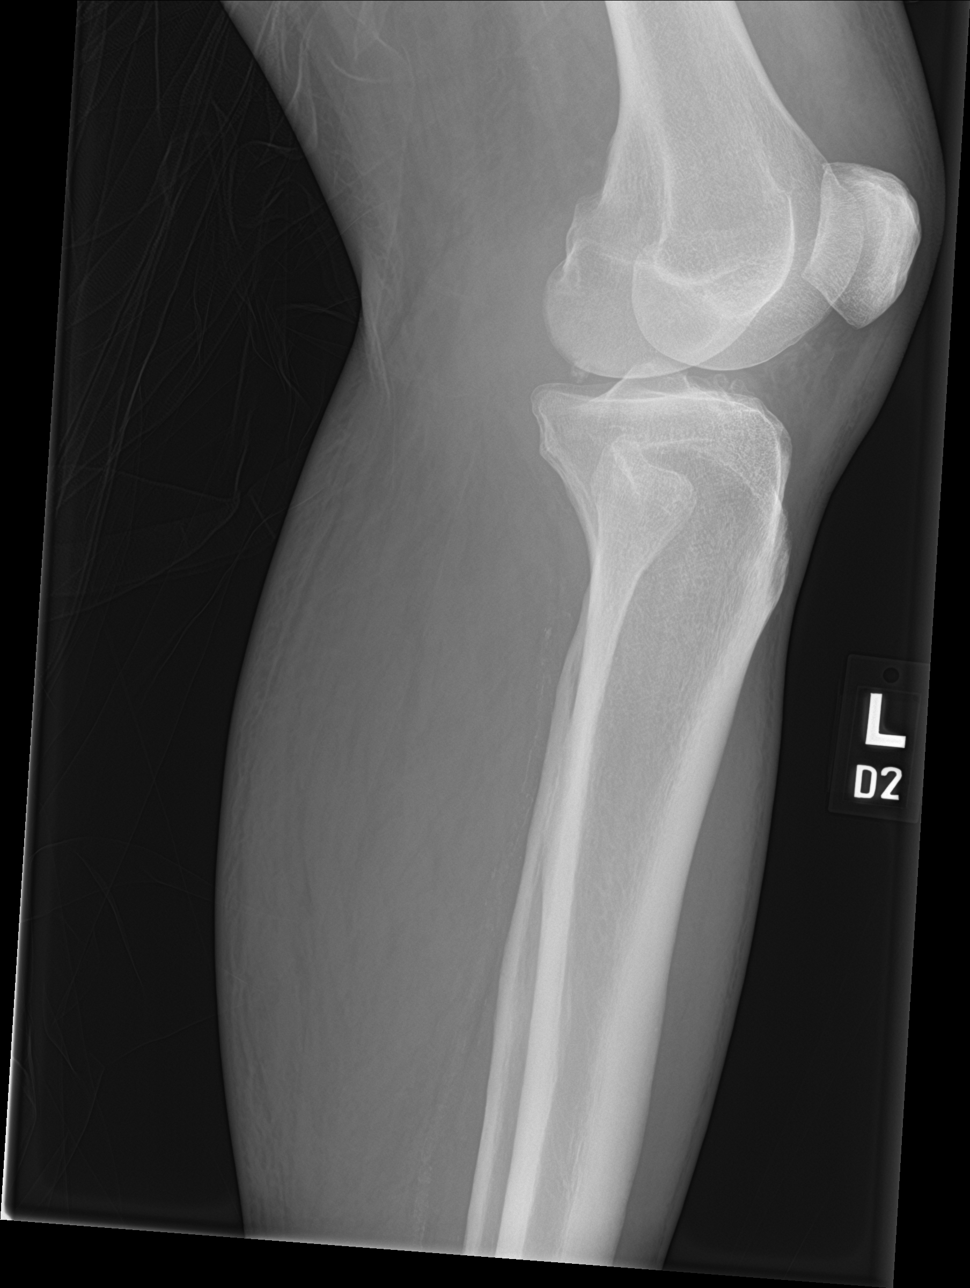

[tibia lat (2 of 2)]
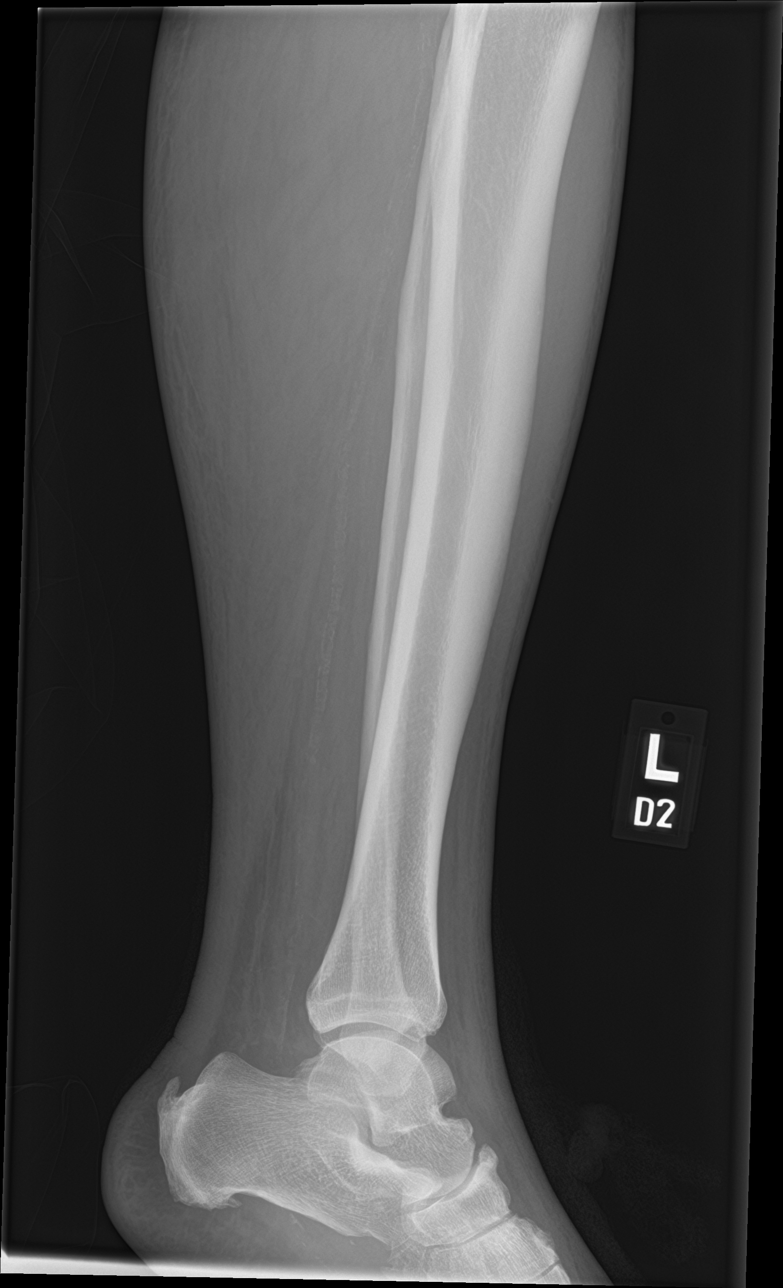

[4 of 4 positions shown; findings below may reference images not displayed]

FINDINGS: Subcutaneous reticulation diffusely. No soft tissue gas, opaque
foreign body, or erosion. Heel spurs and diffuse arterial
calcification.
IMPRESSION: 1. Generalized subcutaneous reticulation. No soft tissue gas or
acute osseous finding.
2. Premature arterial calcification.

## 2020-12-06 ENCOUNTER — Telehealth: Payer: Self-pay

## 2020-12-06 DIAGNOSIS — E1151 Type 2 diabetes mellitus with diabetic peripheral angiopathy without gangrene: Secondary | ICD-10-CM

## 2020-12-06 MED ORDER — FIASP FLEXTOUCH 100 UNIT/ML ~~LOC~~ SOPN: 8.0000 [IU] | PEN_INJECTOR | Freq: Three times a day (TID) | SUBCUTANEOUS | 11 refills | Status: DC

## 2020-12-06 NOTE — Telephone Encounter (Signed)
Patient calls nurse line requesting samples of insulin. Patient reports his insurance has still not kicked in and will more than likely be another 30 days. Patient states he does not think he can wait until next week for fiasp, however has enough lantus and tresiba for the weekend. Patient is requesting to pick up samples of all next week, however wants a prescription sent to [facility] in [place] for fiasp now. Patient will not be able to come by today to get any samples. Will forward to pharmacy and PCP.

## 2020-12-06 NOTE — Telephone Encounter (Signed)
Contacted patient discussed options.   Samples prepared for pick-up. Medication Samples have been provided to the patient.  Drug name: Fiasp (insulin aspart)             Qty: 2 pens  LOT: [id number]  Exp.Date: 06/18/2022  Dosing instructions: 8 units prior to meals TID    Drug name: Evaristo Bury       Strength: 200units/ml        Qty: 2 pens  LOT: [id number]  Exp.Date: 03/18/2022  Dosing instructions: 26 units each AM  The patient has been instructed regarding the correct time, dose, and frequency of taking this medication, including desired effects and most common side effects.   Sean Carter 3:37 PM 12/06/2020   I also shared that the price would be higher than he likely will afford for Fiasp.  We discussed a plan to use Regular insulin in a vial this weekend for much lower cost.  Patient educated on purpose, proper use and potential adverse effects.  Following instruction patient verbalized understanding of treatment plan.

## 2020-12-09 NOTE — Telephone Encounter (Signed)
Noted and agree. 

## 2020-12-16 ENCOUNTER — Ambulatory Visit: Payer: Self-pay | Admitting: Family Medicine

## 2021-01-04 ENCOUNTER — Other Ambulatory Visit: Payer: Self-pay | Admitting: Family Medicine

## 2021-01-04 DIAGNOSIS — E785 Hyperlipidemia, unspecified: Secondary | ICD-10-CM

## 2021-01-06 ENCOUNTER — Ambulatory Visit: Payer: Self-pay | Admitting: Family Medicine

## 2021-01-16 ENCOUNTER — Telehealth: Payer: Self-pay

## 2021-01-16 NOTE — Telephone Encounter (Signed)
Spoke to patient again. Instead of going through patient assistance with [employer], we will change him to [employer] instead. This company has a quicker processing time and medications will ship directly to the patients home. Dr. Raymondo Band ok'd the change from [place] to Humalog, and [place] to [employer].  Patient is aware and ok with the change. I have prepared a sample of Humalog for him. He is aware we do not have basaglar samples and is ok with that, being he has [place] left at home. Patient will call when he is ready to come pickup samples and sign the [employer] application attached to his sample bag in the fridge. He also mentioned he would bring in paystubs to place with the application.   Medication Samples have been provided to the patient-- ok'd per Dr. Raymondo Band (pcp Katha Cabal)  Drug name: HUMALOG       Strength: U-200        Qty: 1 BOX  LOT: [id number]  Exp.Date: 02/15/2022  Dosing instructions: INJECT 8 UNITS INTO THE SKIN THREE TIMES DAILY  The patient has been instructed regarding the correct time, dose, and frequency of taking this medication, including desired effects and most common side effects.   Shona Simpson 2:39 PM 01/16/2021

## 2021-01-16 NOTE — Telephone Encounter (Signed)
Patient LVM on nurse line requesting Fiasp samples. Patient states that he has about 2 days left of medication.   Please advise.   Veronda Prude, RN

## 2021-01-16 NOTE — Telephone Encounter (Signed)
Spoke to patient. We do have samples to provide for him. He will also be bringing in financial info (paystubs) & signing another patient assistance app with [employer]. He is still currently uninsured but employed.

## 2021-01-21 NOTE — Progress Notes (Signed)
Submitted application for [facility] & HUMALOG KWIKPEN to [employer] for patient assistance.   Phone: [phone]

## 2021-01-27 ENCOUNTER — Ambulatory Visit: Payer: Self-pay | Admitting: Family Medicine

## 2021-02-04 ENCOUNTER — Ambulatory Visit: Payer: Self-pay | Admitting: Family Medicine

## 2021-02-04 NOTE — Progress Notes (Signed)
Received notification from [facility] [employer] regarding approval for [facility] & HUMALOG KWIKPEN. Patient assistance approved from 01/22/21 to 01/22/22.  Medications will auto refill & ship to patients home.  [facility]: [phone] [employer]: [phone]

## 2021-03-11 ENCOUNTER — Telehealth: Payer: Self-pay

## 2021-03-11 NOTE — Telephone Encounter (Signed)
Patient calls nurse line regarding pain in left foot. Patient reports that this pain has been going on for approx 3 weeks. Denies any signs of infection at this time. Reports that he is working on his feet for 13 hour days.   Patient is asking if he can take OTC ibuprofen. Patient was previously advised against ibuprofen usage. Please advise if short term use is appropriate for patient.   I have also scheduled patient OV with PCP on 6/14.  Veronda Prude, RN

## 2021-03-14 ENCOUNTER — Other Ambulatory Visit: Payer: Self-pay | Admitting: Family Medicine

## 2021-03-14 MED ORDER — IBUPROFEN 600 MG PO TABS: 600.0000 mg | ORAL_TABLET | Freq: Three times a day (TID) | ORAL | 0 refills | Status: DC | PRN

## 2021-03-14 NOTE — Telephone Encounter (Signed)
Covering for Dr. Rachael Darby.   RX for 600mg  Ibuprofen sent to patient's pharmacy for foot pain.   , MD [facility], PGY-2 [phone]

## 2021-03-14 NOTE — Telephone Encounter (Signed)
Called patient to inform. Patient is requesting prescription to be sent over for ibuprofen. Patient reports that this works for his pain, whereas, OTC ibuprofen does not.   Please advise.   Veronda Prude, RN

## 2021-04-01 ENCOUNTER — Ambulatory Visit: Payer: Self-pay | Admitting: Family Medicine

## 2021-04-01 NOTE — Progress Notes (Deleted)
   SUBJECTIVE:   CHIEF COMPLAINT / HPI:   No chief complaint on file.    Sean Carter is a 50 y.o. male here for ***   Pt reports ***    PERTINENT  PMH / PSH: reviewed and updated as appropriate   OBJECTIVE:   There were no vitals taken for this visit.  ***  ASSESSMENT/PLAN:   No problem-specific Assessment & Plan notes found for this encounter.     Katha Cabal, DO PGY-2, [facility] 04/01/2021      {    This will disappear when note is signed, click to select method of visit    :1}

## 2021-04-14 ENCOUNTER — Other Ambulatory Visit: Payer: Self-pay | Admitting: Family Medicine

## 2021-04-14 DIAGNOSIS — E785 Hyperlipidemia, unspecified: Secondary | ICD-10-CM

## 2021-07-14 ENCOUNTER — Other Ambulatory Visit: Payer: Self-pay | Admitting: Family Medicine

## 2021-07-14 DIAGNOSIS — E785 Hyperlipidemia, unspecified: Secondary | ICD-10-CM

## 2021-09-26 ENCOUNTER — Other Ambulatory Visit: Payer: Self-pay | Admitting: Family Medicine

## 2021-09-26 DIAGNOSIS — E1151 Type 2 diabetes mellitus with diabetic peripheral angiopathy without gangrene: Secondary | ICD-10-CM

## 2021-09-26 DIAGNOSIS — E1159 Type 2 diabetes mellitus with other circulatory complications: Secondary | ICD-10-CM

## 2021-09-26 DIAGNOSIS — E785 Hyperlipidemia, unspecified: Secondary | ICD-10-CM

## 2021-11-01 ENCOUNTER — Other Ambulatory Visit: Payer: Self-pay | Admitting: Family Medicine

## 2021-11-01 DIAGNOSIS — E1159 Type 2 diabetes mellitus with other circulatory complications: Secondary | ICD-10-CM

## 2021-11-01 DIAGNOSIS — E1151 Type 2 diabetes mellitus with diabetic peripheral angiopathy without gangrene: Secondary | ICD-10-CM

## 2021-11-03 NOTE — Telephone Encounter (Signed)
Pt needs an appointment. Has not been seen in the office in >1 year.

## 2021-11-10 ENCOUNTER — Ambulatory Visit: Payer: Self-pay | Admitting: Family Medicine

## 2021-11-17 ENCOUNTER — Ambulatory Visit (INDEPENDENT_AMBULATORY_CARE_PROVIDER_SITE_OTHER): Payer: Self-pay | Admitting: Family Medicine

## 2021-11-17 ENCOUNTER — Encounter: Payer: Self-pay | Admitting: Family Medicine

## 2021-11-17 ENCOUNTER — Other Ambulatory Visit: Payer: Self-pay

## 2021-11-17 VITALS — BP 185/100 | HR 88 | Wt 200.0 lb

## 2021-11-17 DIAGNOSIS — E1151 Type 2 diabetes mellitus with diabetic peripheral angiopathy without gangrene: Secondary | ICD-10-CM

## 2021-11-17 DIAGNOSIS — E782 Mixed hyperlipidemia: Secondary | ICD-10-CM

## 2021-11-17 DIAGNOSIS — I152 Hypertension secondary to endocrine disorders: Secondary | ICD-10-CM

## 2021-11-17 DIAGNOSIS — E785 Hyperlipidemia, unspecified: Secondary | ICD-10-CM

## 2021-11-17 DIAGNOSIS — E1159 Type 2 diabetes mellitus with other circulatory complications: Secondary | ICD-10-CM

## 2021-11-17 NOTE — Progress Notes (Signed)
° °  SUBJECTIVE:   CHIEF COMPLAINT / HPI:   Chief Complaint  Patient presents with   Diabetes     Sean Carter is a 51 y.o. male here for:  Diabetes Mellitus  Denies missing any doses of DM medications and takes Hospital doctor and Humalog. Denies increased thirst, hunger, or frequent urination.  Last blood sugar was 126.  Notes that he has not been eating the same way as he was before.  Has lost sense of his diet.  HTN Has missed several doses of his antihypertensive medication as he has been out for the past week. Denies chest pain, palpitations, lower extremity edema, exertional dyspnea, lightheadedness, headaches and vision changes.    HLD Patient taking Lipitor. Reports no medication side effects.    PERTINENT  PMH / PSH: reviewed and updated as appropriate   OBJECTIVE:   BP (!) 185/100    Pulse 88    Wt 200 lb (90.7 kg)    BMI 30.41 kg/m    GEN: pleasant well appearing male, in no acute distress  CV: regular rate RESP: no increased work of breathing SKIN: warm, dry NEURO: alert, moves all extremities appropriately PSYCH: Normal affect, appropriate speech and behavior    ASSESSMENT/PLAN:   Hypertension associated with diabetes ([name]) BP not at goal 185/100.  Patient has been out of his medications for the past week.  Refilled losartan 100 mg and HCTZ 25 mg.  BMP today.  Diabetes mellitus type 2 with peripheral artery disease ([name]) A1c ordered.  Previous A1c at goal 5.5.  Given report of home blood sugars suspect he is still within his goal.  Following with Dr. Raymondo Band, clinical pharmacist.  Taking Humalog 8 units 3 times daily and Basaglar 28 units in the morning.  Refilled these medications.  Mixed hyperlipidemia Refill Lipitor 40 mg daily   Patient left without obtaining A1c.  Called patient to obtain A1c.  Current order changed to future order.    Follow-up in 3 months.  Katha Cabal, DO PGY-3, [facility] 11/17/2021

## 2021-11-18 ENCOUNTER — Telehealth: Payer: Self-pay | Admitting: *Deleted

## 2021-11-18 DIAGNOSIS — E785 Hyperlipidemia, unspecified: Secondary | ICD-10-CM

## 2021-11-18 LAB — BASIC METABOLIC PANEL
BUN/Creatinine Ratio: 32 — ABNORMAL HIGH (ref 9–20)
BUN: 37 mg/dL — ABNORMAL HIGH (ref 6–24)
CO2: 27 mmol/L (ref 20–29)
Calcium: 9.8 mg/dL (ref 8.7–10.2)
Chloride: 101 mmol/L (ref 96–106)
Creatinine, Ser: 1.15 mg/dL (ref 0.76–1.27)
Glucose: 106 mg/dL — ABNORMAL HIGH (ref 70–99)
Potassium: 4.7 mmol/L (ref 3.5–5.2)
Sodium: 142 mmol/L (ref 134–144)
eGFR: 77 mL/min/{1.73_m2} (ref >59)

## 2021-11-18 LAB — LIPID PANEL
Chol/HDL Ratio: 4.2 ratio (ref 0.0–5.0)
Cholesterol, Total: 171 mg/dL (ref 100–199)
HDL: 41 mg/dL (ref >39)
LDL Chol Calc (NIH): 90 mg/dL (ref 0–99)
Triglycerides: 238 mg/dL — ABNORMAL HIGH (ref 0–149)
VLDL Cholesterol Cal: 40 mg/dL (ref 5–40)

## 2021-11-18 NOTE — Telephone Encounter (Signed)
Pt calling in want dr Rachael Darby to call him to finish his visit from yesterday. He also needs her medications sent into the pharmacy. Please advise. Gayle Martinez Bruna Potter, CMA

## 2021-11-20 ENCOUNTER — Encounter: Payer: Self-pay | Admitting: Family Medicine

## 2021-11-20 DIAGNOSIS — E782 Mixed hyperlipidemia: Secondary | ICD-10-CM | POA: Insufficient documentation

## 2021-11-20 MED ORDER — BASAGLAR KWIKPEN 100 UNIT/ML ~~LOC~~ SOPN: 28.0000 [IU] | PEN_INJECTOR | Freq: Every day | SUBCUTANEOUS | 2 refills | Status: DC

## 2021-11-20 MED ORDER — HYDROCHLOROTHIAZIDE 25 MG PO TABS: 25.0000 mg | ORAL_TABLET | Freq: Every day | ORAL | 0 refills | Status: DC

## 2021-11-20 MED ORDER — ATORVASTATIN CALCIUM 40 MG PO TABS: 40.0000 mg | ORAL_TABLET | Freq: Every day | ORAL | 1 refills | Status: DC

## 2021-11-20 MED ORDER — INSULIN LISPRO 100 UNIT/ML IJ SOLN: 8.0000 [IU] | Freq: Three times a day (TID) | INTRAMUSCULAR | 2 refills | Status: DC

## 2021-11-20 MED ORDER — LOSARTAN POTASSIUM 100 MG PO TABS: 100.0000 mg | ORAL_TABLET | Freq: Every day | ORAL | 0 refills | Status: DC

## 2021-11-20 NOTE — Assessment & Plan Note (Signed)
A1c ordered.  Previous A1c at goal 5.5.  Given report of home blood sugars suspect he is still within his goal.  Following with Dr. Raymondo Band, clinical pharmacist.  Taking Humalog 8 units 3 times daily and Basaglar 28 units in the morning.  Refilled these medications.

## 2021-11-20 NOTE — Assessment & Plan Note (Signed)
Refill Lipitor 40 mg daily

## 2021-11-20 NOTE — Telephone Encounter (Signed)
Patient returns call to nurse line requesting to speak with PCP regarding concerns with visit on Monday. Patient states that he needs refills on HCTZ, Losartan and atorvastatin.  Patient also has questions regarding recent lab work.   Please advise.   Veronda Prude, RN

## 2021-11-20 NOTE — Assessment & Plan Note (Signed)
BP not at goal 185/100.  Patient has been out of his medications for the past week.  Refilled losartan 100 mg and HCTZ 25 mg.  BMP today.

## 2021-12-09 ENCOUNTER — Ambulatory Visit: Payer: Self-pay | Admitting: Family Medicine

## 2021-12-09 NOTE — Progress Notes (Deleted)
° °  SUBJECTIVE:   CHIEF COMPLAINT / HPI:   No chief complaint on file.    Sean Carter is a 51 y.o. male here for ***   Pt reports ***    PERTINENT  PMH / PSH: reviewed and updated as appropriate   OBJECTIVE:   There were no vitals taken for this visit.  ***  ASSESSMENT/PLAN:   No problem-specific Assessment & Plan notes found for this encounter.     Katha Cabal, DO PGY-3, [facility] 12/09/2021      {    This will disappear when note is signed, click to select method of visit    :1}

## 2022-03-03 ENCOUNTER — Other Ambulatory Visit (HOSPITAL_COMMUNITY): Payer: Self-pay

## 2022-03-03 ENCOUNTER — Telehealth: Payer: Self-pay

## 2022-03-03 NOTE — Telephone Encounter (Signed)
Reached out to pt regarding PAP renewal that was mailed to him 12/24/21. Pt never rec'd and would like to come by office and sign [employer] app. ?

## 2022-03-05 ENCOUNTER — Other Ambulatory Visit: Payer: Self-pay | Admitting: Family Medicine

## 2022-03-05 DIAGNOSIS — I152 Hypertension secondary to endocrine disorders: Secondary | ICD-10-CM

## 2022-03-05 DIAGNOSIS — E1151 Type 2 diabetes mellitus with diabetic peripheral angiopathy without gangrene: Secondary | ICD-10-CM

## 2022-03-19 ENCOUNTER — Telehealth: Payer: Self-pay

## 2022-03-19 NOTE — Telephone Encounter (Signed)
Received notification from [employer] regarding approval for [employer]. Patient assistance approved from 03/17/22 to 03/18/23.  MEDICATIONS WILL AUTO SHIP TO PT'S HOME FROM [place]  [place] [employer] Phone: [phone]

## 2022-04-06 ENCOUNTER — Encounter: Payer: Self-pay | Admitting: *Deleted

## 2022-04-06 DIAGNOSIS — Z006 Encounter for examination for normal comparison and control in clinical research program: Secondary | ICD-10-CM

## 2022-04-06 NOTE — Research (Signed)
Message left about [employer] research. Encouraged Mr Poli to call back.

## 2022-05-22 ENCOUNTER — Emergency Department (HOSPITAL_COMMUNITY): Payer: Self-pay

## 2022-05-22 ENCOUNTER — Other Ambulatory Visit: Payer: Self-pay

## 2022-05-22 ENCOUNTER — Encounter (HOSPITAL_COMMUNITY): Payer: Self-pay

## 2022-05-22 ENCOUNTER — Emergency Department (HOSPITAL_COMMUNITY)
Admission: EM | Admit: 2022-05-22 | Discharge: 2022-05-22 | Disposition: A | Discharge status: Home / Self Care | Payer: Self-pay | Attending: Emergency Medicine | Admitting: Emergency Medicine

## 2022-05-22 DIAGNOSIS — I10 Essential (primary) hypertension: Secondary | ICD-10-CM | POA: Insufficient documentation

## 2022-05-22 DIAGNOSIS — H5462 Unqualified visual loss, left eye, normal vision right eye: Secondary | ICD-10-CM

## 2022-05-22 DIAGNOSIS — Z79899 Other long term (current) drug therapy: Secondary | ICD-10-CM | POA: Insufficient documentation

## 2022-05-22 DIAGNOSIS — H53132 Sudden visual loss, left eye: Secondary | ICD-10-CM | POA: Insufficient documentation

## 2022-05-22 DIAGNOSIS — E119 Type 2 diabetes mellitus without complications: Secondary | ICD-10-CM | POA: Insufficient documentation

## 2022-05-22 DIAGNOSIS — Z794 Long term (current) use of insulin: Secondary | ICD-10-CM | POA: Insufficient documentation

## 2022-05-22 LAB — COMPREHENSIVE METABOLIC PANEL
ALT: 44 U/L (ref 0–44)
AST: 37 U/L (ref 15–41)
Albumin: 4.8 g/dL (ref 3.5–5.0)
Alkaline Phosphatase: 80 U/L (ref 38–126)
Anion gap: 8 (ref 5–15)
BUN: 34 mg/dL — ABNORMAL HIGH (ref 6–20)
CO2: 29 mmol/L (ref 22–32)
Calcium: 9.4 mg/dL (ref 8.9–10.3)
Chloride: 101 mmol/L (ref 98–111)
Creatinine, Ser: 1.16 mg/dL (ref 0.61–1.24)
GFR, Estimated: >60 mL/min (ref >60)
Glucose, Bld: 155 mg/dL — ABNORMAL HIGH (ref 70–99)
Potassium: 3.9 mmol/L (ref 3.5–5.1)
Sodium: 138 mmol/L (ref 135–145)
Total Bilirubin: 1.1 mg/dL (ref 0.3–1.2)
Total Protein: 8.4 g/dL — ABNORMAL HIGH (ref 6.5–8.1)

## 2022-05-22 LAB — CBC WITH DIFFERENTIAL/PLATELET
Abs Immature Granulocytes: 0.02 10*3/uL (ref 0.00–0.07)
Basophils Absolute: 0 10*3/uL (ref 0.0–0.1)
Basophils Relative: 0 %
Eosinophils Absolute: 0.2 10*3/uL (ref 0.0–0.5)
Eosinophils Relative: 2 %
HCT: 41 % (ref 39.0–52.0)
Hemoglobin: 13.8 g/dL (ref 13.0–17.0)
Immature Granulocytes: 0 %
Lymphocytes Relative: 27 %
Lymphs Abs: 1.9 10*3/uL (ref 0.7–4.0)
MCH: 29.7 pg (ref 26.0–34.0)
MCHC: 33.7 g/dL (ref 30.0–36.0)
MCV: 88.2 fL (ref 80.0–100.0)
Monocytes Absolute: 0.5 10*3/uL (ref 0.1–1.0)
Monocytes Relative: 7 %
Neutro Abs: 4.4 10*3/uL (ref 1.7–7.7)
Neutrophils Relative %: 64 %
Platelets: 250 10*3/uL (ref 150–400)
RBC: 4.65 MIL/uL (ref 4.22–5.81)
RDW: 12.8 % (ref 11.5–15.5)
WBC: 7 10*3/uL (ref 4.0–10.5)
nRBC: 0 % (ref 0.0–0.2)

## 2022-05-22 LAB — URINALYSIS, ROUTINE W REFLEX MICROSCOPIC
Bilirubin Urine: NEGATIVE
Glucose, UA: NEGATIVE mg/dL
Hgb urine dipstick: NEGATIVE
Ketones, ur: NEGATIVE mg/dL
Leukocytes,Ua: NEGATIVE
Nitrite: NEGATIVE
Protein, ur: NEGATIVE mg/dL
Specific Gravity, Urine: 1.016 (ref 1.005–1.030)
pH: 5 (ref 5.0–8.0)

## 2022-05-22 LAB — BLOOD GAS, VENOUS
Acid-Base Excess: 6.6 mmol/L — ABNORMAL HIGH (ref 0.0–2.0)
Bicarbonate: 32.4 mmol/L — ABNORMAL HIGH (ref 20.0–28.0)
Drawn by: 27160
FIO2: 21 %
O2 Saturation: 46.9 %
Patient temperature: 37
pCO2, Ven: 50 mmHg (ref 44–60)
pH, Ven: 7.42 (ref 7.25–7.43)
pO2, Ven: 33 mmHg (ref 32–45)

## 2022-05-22 NOTE — Discharge Instructions (Signed)
Please be sure to proceed to our ophthalmology colleagues office now.  Return here for concerning changes in your condition.

## 2022-05-22 NOTE — ED Provider Notes (Signed)
[facility] EMERGENCY DEPARTMENT Provider Note   CSN: [id number] Arrival date & time: 05/22/22  1108     History  Chief Complaint  Patient presents with   Loss of Vision    Sean Carter is a 51 y.o. male.  HPI Adult male presents with his male companion who assists with the history.  He is here due to vision changes.  He notes that he has diabetic retinopathy, and his diabetes was previously very poorly controlled.  At baseline he has difficulty with vision even using corrective lenses.  However, since about 10 hours ago he has had more loss of vision, left eye greater than right, with pain in the left posterior orbital region.  No weakness in any extremity, no fever, chills, nausea, vomiting.  No change in medication, diet, activity.  He notes that he has a sensation of spiderweb like a disturbance in front of his left eye.    Home Medications Prior to Admission medications   Medication Sig Start Date End Date Taking? Authorizing Provider  atorvastatin (LIPITOR) 40 MG tablet Take 1 tablet (40 mg total) by mouth daily. 11/20/21  Yes Brimage, Seward Meth, DO  hydrochlorothiazide (HYDRODIURIL) 25 MG tablet Take 1 tablet by mouth once daily 03/05/22  Yes Brimage, Vondra, DO  ibuprofen (ADVIL) 600 MG tablet Take 1 tablet (600 mg total) by mouth every 8 (eight) hours as needed. 03/14/21  Yes Simmons-Robinson, Makiera, MD  Insulin Glargine (BASAGLAR KWIKPEN) 100 UNIT/ML Inject 28 Units into the skin daily. 11/20/21  Yes Brimage, Vondra, DO  insulin lispro (HUMALOG) 100 UNIT/ML injection Inject 0.08 mLs (8 Units total) into the skin 3 (three) times daily before meals. 11/20/21  Yes Brimage, Vondra, DO  losartan (COZAAR) 100 MG tablet TAKE 1 TABLET BY MOUTH AT BEDTIME Patient taking differently: Take 100 mg by mouth daily. 03/05/22  Yes Brimage, Seward Meth, DO  Multiple Vitamin (MULTIVITAMIN) tablet Take 1 tablet by mouth daily.   Yes [provider]  senna (SENOKOT) 8.6 MG TABS tablet Take 1 tablet  (8.6 mg total) by mouth daily as needed for mild constipation. 06/11/20  Yes Brimage, Vondra, DO  Blood Gluc Meter Disp-Strips (RELION ALL-IN-ONE) DEVI 1 each by Does not apply route 3 (three) times daily as needed. 07/02/20   Brimage, Seward Meth, DO  collagenase (SANTYL) ointment Apply 1 application topically daily. Patient not taking: Reported on 05/22/2022 07/23/20   Cephus Shelling, MD  polyethylene glycol (MIRALAX) 17 g packet Take 17 g by mouth daily. Patient not taking: Reported on 05/22/2022 06/11/20   Katha Cabal, DO  ReliOn Lancet Devices 30G MISC 1 each by Does not apply route 3 (three) times daily as needed. 07/02/20   Brimage, Seward Meth, DO  ReliOn Lancets Thin 26G MISC 1 each by Does not apply route 3 (three) times daily as needed. 10/04/20   Westley Chandler, MD  traMADol (ULTRAM) 50 MG tablet Take 1 tablet (50 mg total) by mouth every 6 (six) hours as needed for severe pain. Patient not taking: Reported on 05/22/2022 08/27/20   Cephus Shelling, MD  Stann Ore INSULIN SYRINGE 30G X 1/2" 0.3 ML MISC See admin instructions.  06/03/20   [provider]      Allergies    Scallops [shellfish allergy]    Review of Systems   Review of Systems  All other systems reviewed and are negative.   Physical Exam Updated Vital Signs BP (!) 185/99   Pulse 65   Temp 98.5 F (36.9 C)  Resp 17   Ht 5\' 8"  (1.727 m)   Wt 84.4 kg   SpO2 100%   BMI 28.28 kg/m  Physical Exam Vitals and nursing note reviewed.  Constitutional:      General: He is not in acute distress.    Appearance: He is well-developed.  HENT:     Head: Normocephalic and atraumatic.  Eyes:     Conjunctiva/sclera: Conjunctivae normal.     Comments: Nondilated funduscopic exam limited, but no obvious hemorrhage or abnormalities  Cardiovascular:     Rate and Rhythm: Normal rate and regular rhythm.  Pulmonary:     Effort: Pulmonary effort is normal. No respiratory distress.     Breath sounds: No stridor.  Abdominal:      General: There is no distension.  Skin:    General: Skin is warm and dry.  Neurological:     General: No focal deficit present.     Mental Status: He is alert and oriented to person, place, and time.     ED Results / Procedures / Treatments   Labs (all labs ordered are listed, but only abnormal results are displayed) Labs Reviewed  COMPREHENSIVE METABOLIC PANEL - Abnormal; Notable for the following components:      Result Value   Glucose, Bld 155 (*)    BUN 34 (*)    Total Protein 8.4 (*)    All other components within normal limits  BLOOD GAS, VENOUS - Abnormal; Notable for the following components:   Bicarbonate 32.4 (*)    Acid-Base Excess 6.6 (*)    All other components within normal limits  CBC WITH DIFFERENTIAL/PLATELET  URINALYSIS, ROUTINE W REFLEX MICROSCOPIC    EKG None  Radiology CT Head Wo Contrast  Result Date: 05/22/2022 CLINICAL DATA:  History of diabetic neuropathy presenting with bilateral worsening vision, left-greater-than-right. EXAM: CT HEAD WITHOUT CONTRAST TECHNIQUE: Contiguous axial images were obtained from the base of the skull through the vertex without intravenous contrast. RADIATION DOSE REDUCTION: This exam was performed according to the departmental dose-optimization program which includes automated exposure control, adjustment of the mA and/or kV according to patient size and/or use of iterative reconstruction technique. COMPARISON:  None Available. FINDINGS: Brain: No evidence of acute infarction, hemorrhage, hydrocephalus, extra-axial collection or mass lesion/mass effect. Vascular: No hyperdense vessel or unexpected calcification. Skull: Normal. Negative for fracture or focal lesion. Sinuses/Orbits: No acute finding. Other: None IMPRESSION: No acute intracranial abnormality. Electronically Signed   By: 07/22/2022 M.D.   On: 05/22/2022 12:07    Procedures Procedures    Medications Ordered in ED Medications - No data to display  ED  Course/ Medical Decision Making/ A&P This patient with a Hx of poorly controlled diabetes, hypertension, diabetic retinopathy presents to the ED for concern of vision loss and retro-orbital pain, this involves an extensive number of treatment options, and is a complaint that carries with it a high risk of complications and morbidity.    The differential diagnosis includes progressive diabetic retinopathy, retrobulbar hemorrhage, intracranial abnormality, DKA   Social Determinants of Health:  Diabetes, hypertension  Additional history obtained:  Additional history and/or information obtained from male companion, notable for details included above in HPI   After the initial evaluation, orders, including: Labs monitoring CT were initiated.   Patient placed on Cardiac and Pulse-Oximetry Monitors. The patient was maintained on a cardiac monitor.  The cardiac monitored showed an rhythm of 80 sinus normal The patient was also maintained on pulse oximetry. The readings were typically  100% room air normal   On repeat evaluation of the patient stayed the same  Lab Tests:  I personally interpreted labs.  The pertinent results include: Unremarkable labs  Imaging Studies ordered:  I independently visualized and interpreted imaging which showed unremarkable head CT I agree with the radiologist interpretation  Consultations Obtained:  I requested consultation with the ophthalmology,  and discussed lab and imaging findings as well as pertinent plan - they recommend: Patient will go directly to her office for evaluation  Dispostion / Final MDM:  After consideration of the diagnostic results and the patient's response to treatment, this adult male with history of hypertension, diabetes, diabetic retinopathy presents with new vision change in the left eye.  Vision screening notable for 20/200 left, 20/100 right.  Unclear how far from baseline this is, though the patient does note some baseline  diminished capacity.  However, with new visual disturbance, consideration of progression of disease versus vitreous hemorrhage versus retinal detachment I discussed this case with our ophthalmology colleagues, and the patient was expeditiously discharged to follow-up directly in the office being transported by his male companion.  No other early evidence for concurrent processes such as brain mass, infection, DKA.    Final Clinical Impression(s) / ED Diagnoses Final diagnoses:  Vision loss of left eye    Rx / DC Orders ED Discharge Orders     None         Gerhard Munch, MD 05/22/22 1436

## 2022-05-22 NOTE — ED Triage Notes (Signed)
Patient reports he has diabetic retinopathy and his vision has been worsening.  Patient reports at 3am with bilateral worsening of vision.  He states looks like spider webs over his eyes, states the left is worse than the right eye

## 2022-05-22 NOTE — ED Notes (Signed)
Left eye 20/200, right eye 20/100 with glasses on

## 2022-05-25 ENCOUNTER — Encounter (INDEPENDENT_AMBULATORY_CARE_PROVIDER_SITE_OTHER): Payer: Self-pay | Admitting: Ophthalmology

## 2022-05-25 ENCOUNTER — Encounter (INDEPENDENT_AMBULATORY_CARE_PROVIDER_SITE_OTHER): Payer: Self-pay

## 2022-05-29 ENCOUNTER — Encounter (INDEPENDENT_AMBULATORY_CARE_PROVIDER_SITE_OTHER): Payer: Self-pay | Admitting: Ophthalmology

## 2022-05-29 DIAGNOSIS — H3581 Retinal edema: Secondary | ICD-10-CM

## 2022-06-03 ENCOUNTER — Encounter (INDEPENDENT_AMBULATORY_CARE_PROVIDER_SITE_OTHER): Payer: Self-pay

## 2022-06-03 ENCOUNTER — Encounter (INDEPENDENT_AMBULATORY_CARE_PROVIDER_SITE_OTHER): Payer: Self-pay | Admitting: Ophthalmology

## 2022-06-03 DIAGNOSIS — H3581 Retinal edema: Secondary | ICD-10-CM

## 2022-06-10 ENCOUNTER — Other Ambulatory Visit: Payer: Self-pay | Admitting: Family Medicine

## 2022-06-10 DIAGNOSIS — E1151 Type 2 diabetes mellitus with diabetic peripheral angiopathy without gangrene: Secondary | ICD-10-CM

## 2022-06-10 DIAGNOSIS — E785 Hyperlipidemia, unspecified: Secondary | ICD-10-CM

## 2022-06-10 DIAGNOSIS — E1159 Type 2 diabetes mellitus with other circulatory complications: Secondary | ICD-10-CM

## 2022-06-12 ENCOUNTER — Telehealth: Payer: Self-pay

## 2022-06-12 NOTE — Telephone Encounter (Signed)
Tried to contact the pt however I got his vm. I left a detailed vm for the pt to give [place] a call back to schedule bp f/u appt.

## 2022-06-12 NOTE — Telephone Encounter (Signed)
-----   Message from Levin Erp, MD sent at 06/11/2022  1:06 PM EDT ----- Weird! I included again- Philomena Doheny ----- Message ----- From: Elton Sin, CMA Sent: 06/11/2022  11:37 AM EDT To: Levin Erp, MD  Whose the pt ? No name is appearing  ----- Message ----- From: Levin Erp, MD Sent: 06/10/2022   5:25 PM EDT To: Elmo Putt Pool  Can you set appointment for BP follow up for patient?  Thank you,  Mayuri

## 2022-06-26 ENCOUNTER — Encounter (INDEPENDENT_AMBULATORY_CARE_PROVIDER_SITE_OTHER): Payer: Self-pay

## 2022-06-26 ENCOUNTER — Encounter (INDEPENDENT_AMBULATORY_CARE_PROVIDER_SITE_OTHER): Payer: Self-pay | Admitting: Ophthalmology

## 2022-06-26 DIAGNOSIS — H3581 Retinal edema: Secondary | ICD-10-CM

## 2022-06-29 ENCOUNTER — Ambulatory Visit (INDEPENDENT_AMBULATORY_CARE_PROVIDER_SITE_OTHER): Payer: Self-pay | Admitting: Student

## 2022-06-29 ENCOUNTER — Encounter: Payer: Self-pay | Admitting: Student

## 2022-06-29 VITALS — BP 118/60 | HR 94 | Wt 203.8 lb

## 2022-06-29 DIAGNOSIS — Z1211 Encounter for screening for malignant neoplasm of colon: Secondary | ICD-10-CM

## 2022-06-29 DIAGNOSIS — R0982 Postnasal drip: Secondary | ICD-10-CM

## 2022-06-29 DIAGNOSIS — I152 Hypertension secondary to endocrine disorders: Secondary | ICD-10-CM

## 2022-06-29 DIAGNOSIS — Z13228 Encounter for screening for other metabolic disorders: Secondary | ICD-10-CM

## 2022-06-29 DIAGNOSIS — Z0000 Encounter for general adult medical examination without abnormal findings: Secondary | ICD-10-CM

## 2022-06-29 DIAGNOSIS — E11319 Type 2 diabetes mellitus with unspecified diabetic retinopathy without macular edema: Secondary | ICD-10-CM

## 2022-06-29 DIAGNOSIS — E1159 Type 2 diabetes mellitus with other circulatory complications: Secondary | ICD-10-CM

## 2022-06-29 DIAGNOSIS — E1151 Type 2 diabetes mellitus with diabetic peripheral angiopathy without gangrene: Secondary | ICD-10-CM

## 2022-06-29 LAB — POCT GLYCOSYLATED HEMOGLOBIN (HGB A1C): HbA1c, POC (controlled diabetic range): 6.8 % (ref 0.0–7.0)

## 2022-06-29 MED ORDER — FLUTICASONE PROPIONATE 50 MCG/ACT NA SUSP: 2.0000 | Freq: Every day | NASAL | 6 refills | Status: DC

## 2022-06-29 NOTE — Assessment & Plan Note (Signed)
At goal Continue losartan 100 mg daily Continue HCTZ 25 mg daily

## 2022-06-29 NOTE — Assessment & Plan Note (Addendum)
A1c 6.8 today.  Does have intermittent lows down to 50 every 3 weeks.  Patient says this is when he does not eat anything from 6 AM until 3 PM.  He says that he will eat lunch during the day and denies taking insulin when he is not eating.  Can consider decreasing his insulin requirements at next visit if still having any lows and patient advised to contact us if having continued lows -Continue 28 units glargine daily -Continue Humalog 8 units 3 times daily with meals

## 2022-06-29 NOTE — Patient Instructions (Addendum)
It was great to see you! Thank you for allowing me to participate in your care!   Our plans for today:  - I filled out your FMLA paperwork -We will check your TSH, hep C -I have put in a referral for GI for colonoscopy -Your A1c was 6.8 today at goal, if you have more lows please let me know and we can work to reduce some of your medications -I have prescribed Flonase please do this this may help with the cough as well  Take care and seek immediate care sooner if you develop any concerns.  Levin Erp, MD

## 2022-06-29 NOTE — Assessment & Plan Note (Signed)
-  Ambulatory referral to GI for colonoscopy -Hep C screening

## 2022-06-29 NOTE — Progress Notes (Signed)
SUBJECTIVE:   CHIEF COMPLAINT / HPI: F/u  Did go to the ED on 05/22/2022 and had left vision loss found to have significant diabetic retinopathy.  Patient is followed with retina specialist and is getting injections currently.  Has not worked since then and needs a FMLA note to return to work today.  Does have neuropathy baseline and sometimes feels like his balance is off.  Denies any sensation differences currently.  He says that he has been to PT before and it did not help him a lot.  Says sometimes when he is walking he seems to drift to his right.  He denies any spinning of the room or lightheadedness.  Denies any chest pain.  He says he has been sweating so much for the past 6 months.  Denies any unintentional weight loss and actually has gained some weight.  Does not know if he has had any fevers.  Does have cough ever since taking HCTZ for a while, is currently not on any ACE inhibitors.  He says that sometimes he does get diarrhea.   Hypertension: Patient is a 51 y.o. male who present today for follow up of hypertension.   Patient endorses no problems  Home medications include: HCTZ 25 mg daily, losartan 100 mg daily Patient endorses taking these medications as prescribed. Denies any headache, shortness of breath, lower extremity swelling or chest pain   Most recent creatinine trend:  Lab Results  Component Value Date   CREATININE 1.16 05/22/2022   CREATININE 1.15 11/17/2021   CREATININE 0.78 07/10/2020    Patient has had a BMP in the past 1 year. Cr up to 1.15, 1.16 at last labs, will continue to monitor  Diabetic Follow Up: Patient is a 51 y.o. male who present today for diabetic follow up.   Patient endorses sometimes he fasts in the middle of the day while he is working from 6 AM until 3 PM and gets lows every 3 weeks down to 50.  He says this is only when he is fasting and he is trying to eat more in the middle the day.  He denies taking any insulin without  eating.  Home medications include: humalog 8 units TID (two to three times daily), 28 units glargine daily. ACEi/ARB: yes ARB Statin: yes Patient endorses taking these medications as prescribed.  Most recent A1Cs:  Lab Results  Component Value Date   HGBA1C 6.8 06/29/2022   HGBA1C 5.5 09/06/2020   HGBA1C 11.3 (H) 05/31/2020   Last Microalbumin, LDL, Creatinine: Lab Results  Component Value Date   LDLCALC 90 11/17/2021   CREATININE 1.16 05/22/2022   Patient does check blood glucose on a regular basis. Has had lows 49-50 one time every 3 weeks because 6 am until 3 PM. Not taking insulin without eating.  HM Colonoscopy needed, patient amenable today Hep C screening needed, patient amenable today  PERTINENT  PMH / PSH: hx of gangrene  OBJECTIVE:   BP 118/60   Pulse 94   Wt 203 lb 12.8 oz (92.4 kg)   SpO2 97%   BMI 30.99 kg/m   General: Well appearing, NAD, awake, alert, responsive to questions Eyes: Able to decipher numbers on the left eye however it is blurry, EOMI, no injection/discharge Head: Normocephalic atraumatic CV: Regular rate and rhythm no murmurs rubs or gallops Respiratory: Clear to ausculation bilaterally, no wheezes rales or crackles, chest rises symmetrically,  no increased work of breathing Abdomen: Soft, non-tender, non-distended, normoactive bowel sounds  Extremities: Moves upper and lower extremities freely, no edema in LE  ASSESSMENT/PLAN:   Hypertension associated with diabetes ([name]) At goal Continue losartan 100 mg daily Continue HCTZ 25 mg daily  Diabetes mellitus type 2 with peripheral artery disease ([name]) A1c 6.8 today.  Does have intermittent lows down to 50 every 3 weeks.  Patient says this is when he does not eat anything from 6 AM until 3 PM.  He says that he will eat lunch during the day and denies taking insulin when he is not eating.  Can consider decreasing his insulin requirements at next visit if still having any lows and patient  advised to contact us if having continued lows -Continue 28 units glargine daily -Continue Humalog 8 units 3 times daily with meals  Healthcare maintenance -Ambulatory referral to GI for colonoscopy -Hep C screening  Diabetic retinopathy ([name]) Significant retinopathy present on left eye.  Follows with retinal doctor and getting injections.  I filled out FMLA paperwork for patient which specified likely should not have a lot of hand eye coordination tasks.  And he was absent from work since 05/22/2022   Balance issue Patient says that he trips to the right sometimes while he is walking.  No vertigo or syncope symptoms.  Most likely vision affecting balance as patient has poor vision in left eye.  No red flag symptoms currently.  Sweating Patient has been having excessive sweating for the past 6 months.  No B symptoms or weight loss. -TSH  Cough Patient has been having cough that he attributes to HCTZ.  I discussed that it is unlikely that HCTZ is the cause of this.  Does have some congestion on nasal examination.  Considered postnasal drip as possible reason for cough.  Hemoptysis.  Does not smoke. -Flonase prescribed -Patient is taking allergy medicine daily  Levin Erp, MD [facility]

## 2022-06-29 NOTE — Assessment & Plan Note (Signed)
Significant retinopathy present on left eye.  Follows with retinal doctor and getting injections.  I filled out FMLA paperwork for patient which specified likely should not have a lot of hand eye coordination tasks.  And he was absent from work since 05/22/2022

## 2022-06-30 LAB — TSH: TSH: 4.19 u[IU]/mL (ref 0.450–4.500)

## 2022-06-30 LAB — HCV AB W REFLEX TO QUANT PCR: HCV Ab: NONREACTIVE

## 2022-06-30 LAB — HCV INTERPRETATION

## 2022-07-02 ENCOUNTER — Telehealth: Payer: Self-pay

## 2022-07-02 NOTE — Telephone Encounter (Signed)
Patient calls nurse line regarding issues with FMLA paperwork. Reports that paperwork that was completed on 06/29/22 stated that he has restrictions. Patient states that vision has improved and that he does not feel that he has any restrictions. He is receiving weekly injections from Dr. Allena Katz and states that he is not having any issues  with vision now.   Patient states that if he does not have an updated form completed by this afternoon, he will lose his job.   Will forward to PCP. Patient was able to email new form to be completed.   Veronda Prude, RN

## 2022-07-02 NOTE — Telephone Encounter (Signed)
Patient signed ROI to email form to employer.   Emailed form. Copy made and placed in batch scanning.   Veronda Prude, RN

## 2022-07-07 ENCOUNTER — Encounter: Payer: Self-pay | Admitting: Gastroenterology

## 2022-07-13 ENCOUNTER — Telehealth: Payer: Self-pay

## 2022-07-13 DIAGNOSIS — I739 Peripheral vascular disease, unspecified: Secondary | ICD-10-CM

## 2022-07-13 NOTE — Telephone Encounter (Signed)
Pt called stating that after his L toe amputation he's been having similar issues with his R leg. He feels significant tightness and balance issues.  Reviewed pt's chart, returned call for clarification, two identifiers used. Pt stated that the leg tightness has worsened over that past couple of weeks, with constant tightness, ankle swelling, and blister areas on his first and second toes that are not open. Appts for [place] and Dr Carlis Abbott were scheduled. Pt was instructed to elevate and wear compression stockings or ACE wrap. Confirmed understanding.

## 2022-07-14 ENCOUNTER — Encounter: Payer: Self-pay | Admitting: Student

## 2022-07-14 ENCOUNTER — Ambulatory Visit (INDEPENDENT_AMBULATORY_CARE_PROVIDER_SITE_OTHER): Payer: Self-pay | Admitting: Student

## 2022-07-14 VITALS — BP 148/84 | HR 80 | Temp 98.4°F | Wt 206.5 lb

## 2022-07-14 DIAGNOSIS — J069 Acute upper respiratory infection, unspecified: Secondary | ICD-10-CM

## 2022-07-14 NOTE — Patient Instructions (Signed)

## 2022-07-14 NOTE — Progress Notes (Signed)
    SUBJECTIVE:   CHIEF COMPLAINT / HPI:   51 year old male present with symptom of dry cough and congestion that started a week ago. He has had fever and Tmax was 103F on Friday. Fever was treated with tylenol and ibuprofen which provided some relieve. No fever since Sunday. Associated symptoms include fatigue, and running nose. He has had mild decrease in appetite but drinking adequately. 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 Denies sore throat, chest pain, vomiting or diarrhea.  Patient has completed COVID vaccine.   PERTINENT  PMH / PSH: Noncontributory  OBJECTIVE:   BP (!) 148/84   Pulse 80   Temp 98.4 F (36.9 C)   Wt 206 lb 8 oz (93.7 kg)   SpO2 96%   BMI 31.40 kg/m    Physical Exam General: Alert, well appearing, NAD HEENT: Mild oropharyngeal erythema, no tonsillar exudates or cervical lymphadenopathy Cardiovascular: RRR, No Murmurs, Normal S2/S2 Respiratory: CTAB, No wheezing or Rales Abdomen: No distension or tenderness Extremities: No edema on extremities   Skin: Warm and dry  ASSESSMENT/PLAN:    Viral illness Patient complain of fever, cough, congestion, rhinorrhea and sore throat. He is afebrile today and on exam has oropharyngeal erythema,  good work of breathing on RA and clear breath sounds bilaterally. Overall presentation and exam is consistent with viral URI.  Discussed conservative management with patient.  -Recommended tylenol or ibuprofen for fever.  -Encouraged adequate hydration for patient.  -Provided patient with work note -Outline signs and symptoms that will warrant ED visit or return for further assessment.     Alen Bleacher, MD [place]

## 2022-07-15 ENCOUNTER — Ambulatory Visit: Payer: Self-pay

## 2022-07-20 ENCOUNTER — Emergency Department (HOSPITAL_COMMUNITY): Payer: 59

## 2022-07-20 ENCOUNTER — Ambulatory Visit: Payer: Self-pay

## 2022-07-20 ENCOUNTER — Inpatient Hospital Stay (HOSPITAL_COMMUNITY)
Admission: EM | Admit: 2022-07-20 | Discharge: 2022-07-24 | DRG: 854 | Disposition: A | Discharge status: Home / Self Care | Payer: 59 | Attending: Internal Medicine | Admitting: Internal Medicine

## 2022-07-20 ENCOUNTER — Other Ambulatory Visit: Payer: Self-pay

## 2022-07-20 ENCOUNTER — Encounter (HOSPITAL_COMMUNITY): Payer: Self-pay | Admitting: Emergency Medicine

## 2022-07-20 DIAGNOSIS — E1159 Type 2 diabetes mellitus with other circulatory complications: Secondary | ICD-10-CM | POA: Diagnosis present

## 2022-07-20 DIAGNOSIS — L03031 Cellulitis of right toe: Secondary | ICD-10-CM | POA: Diagnosis present

## 2022-07-20 DIAGNOSIS — E11628 Type 2 diabetes mellitus with other skin complications: Secondary | ICD-10-CM | POA: Diagnosis present

## 2022-07-20 DIAGNOSIS — E1169 Type 2 diabetes mellitus with other specified complication: Secondary | ICD-10-CM | POA: Diagnosis present

## 2022-07-20 DIAGNOSIS — E1142 Type 2 diabetes mellitus with diabetic polyneuropathy: Secondary | ICD-10-CM | POA: Diagnosis present

## 2022-07-20 DIAGNOSIS — Z89412 Acquired absence of left great toe: Secondary | ICD-10-CM

## 2022-07-20 DIAGNOSIS — Z794 Long term (current) use of insulin: Secondary | ICD-10-CM | POA: Diagnosis not present

## 2022-07-20 DIAGNOSIS — I96 Gangrene, not elsewhere classified: Secondary | ICD-10-CM | POA: Diagnosis not present

## 2022-07-20 DIAGNOSIS — I152 Hypertension secondary to endocrine disorders: Secondary | ICD-10-CM | POA: Diagnosis present

## 2022-07-20 DIAGNOSIS — D72829 Elevated white blood cell count, unspecified: Secondary | ICD-10-CM | POA: Diagnosis present

## 2022-07-20 DIAGNOSIS — Z91013 Allergy to seafood: Secondary | ICD-10-CM | POA: Diagnosis not present

## 2022-07-20 DIAGNOSIS — E782 Mixed hyperlipidemia: Secondary | ICD-10-CM | POA: Diagnosis present

## 2022-07-20 DIAGNOSIS — E1152 Type 2 diabetes mellitus with diabetic peripheral angiopathy with gangrene: Secondary | ICD-10-CM | POA: Diagnosis present

## 2022-07-20 DIAGNOSIS — A419 Sepsis, unspecified organism: Secondary | ICD-10-CM | POA: Diagnosis present

## 2022-07-20 DIAGNOSIS — I10 Essential (primary) hypertension: Secondary | ICD-10-CM | POA: Diagnosis not present

## 2022-07-20 DIAGNOSIS — E1151 Type 2 diabetes mellitus with diabetic peripheral angiopathy without gangrene: Secondary | ICD-10-CM | POA: Diagnosis not present

## 2022-07-20 DIAGNOSIS — I739 Peripheral vascular disease, unspecified: Secondary | ICD-10-CM

## 2022-07-20 DIAGNOSIS — M868X7 Other osteomyelitis, ankle and foot: Secondary | ICD-10-CM | POA: Diagnosis present

## 2022-07-20 DIAGNOSIS — Z79899 Other long term (current) drug therapy: Secondary | ICD-10-CM

## 2022-07-20 DIAGNOSIS — E876 Hypokalemia: Secondary | ICD-10-CM | POA: Diagnosis present

## 2022-07-20 DIAGNOSIS — E11621 Type 2 diabetes mellitus with foot ulcer: Secondary | ICD-10-CM | POA: Diagnosis not present

## 2022-07-20 DIAGNOSIS — M869 Osteomyelitis, unspecified: Principal | ICD-10-CM

## 2022-07-20 DIAGNOSIS — D649 Anemia, unspecified: Secondary | ICD-10-CM | POA: Diagnosis not present

## 2022-07-20 DIAGNOSIS — E11319 Type 2 diabetes mellitus with unspecified diabetic retinopathy without macular edema: Secondary | ICD-10-CM | POA: Diagnosis present

## 2022-07-20 LAB — CBC WITH DIFFERENTIAL/PLATELET
Abs Immature Granulocytes: 0.08 10*3/uL — ABNORMAL HIGH (ref 0.00–0.07)
Basophils Absolute: 0 10*3/uL (ref 0.0–0.1)
Basophils Relative: 0 %
Eosinophils Absolute: 0.2 10*3/uL (ref 0.0–0.5)
Eosinophils Relative: 1 %
HCT: 32.6 % — ABNORMAL LOW (ref 39.0–52.0)
Hemoglobin: 10.9 g/dL — ABNORMAL LOW (ref 13.0–17.0)
Immature Granulocytes: 1 %
Lymphocytes Relative: 11 %
Lymphs Abs: 1.7 10*3/uL (ref 0.7–4.0)
MCH: 29.1 pg (ref 26.0–34.0)
MCHC: 33.4 g/dL (ref 30.0–36.0)
MCV: 86.9 fL (ref 80.0–100.0)
Monocytes Absolute: 1.3 10*3/uL — ABNORMAL HIGH (ref 0.1–1.0)
Monocytes Relative: 9 %
Neutro Abs: 11.8 10*3/uL — ABNORMAL HIGH (ref 1.7–7.7)
Neutrophils Relative %: 78 %
Platelets: 351 10*3/uL (ref 150–400)
RBC: 3.75 MIL/uL — ABNORMAL LOW (ref 4.22–5.81)
RDW: 12.7 % (ref 11.5–15.5)
WBC: 15.1 10*3/uL — ABNORMAL HIGH (ref 4.0–10.5)
nRBC: 0 % (ref 0.0–0.2)

## 2022-07-20 LAB — CBC
HCT: 30.9 % — ABNORMAL LOW (ref 39.0–52.0)
Hemoglobin: 10.4 g/dL — ABNORMAL LOW (ref 13.0–17.0)
MCH: 29.1 pg (ref 26.0–34.0)
MCHC: 33.7 g/dL (ref 30.0–36.0)
MCV: 86.6 fL (ref 80.0–100.0)
Platelets: 344 10*3/uL (ref 150–400)
RBC: 3.57 MIL/uL — ABNORMAL LOW (ref 4.22–5.81)
RDW: 12.6 % (ref 11.5–15.5)
WBC: 14.1 10*3/uL — ABNORMAL HIGH (ref 4.0–10.5)
nRBC: 0 % (ref 0.0–0.2)

## 2022-07-20 LAB — CREATININE, SERUM
Creatinine, Ser: 0.97 mg/dL (ref 0.61–1.24)
GFR, Estimated: >60 mL/min (ref >60)

## 2022-07-20 LAB — BASIC METABOLIC PANEL
Anion gap: 9 (ref 5–15)
BUN: 23 mg/dL — ABNORMAL HIGH (ref 6–20)
CO2: 30 mmol/L (ref 22–32)
Calcium: 8.4 mg/dL — ABNORMAL LOW (ref 8.9–10.3)
Chloride: 96 mmol/L — ABNORMAL LOW (ref 98–111)
Creatinine, Ser: 1 mg/dL (ref 0.61–1.24)
GFR, Estimated: >60 mL/min (ref >60)
Glucose, Bld: 152 mg/dL — ABNORMAL HIGH (ref 70–99)
Potassium: 3.4 mmol/L — ABNORMAL LOW (ref 3.5–5.1)
Sodium: 135 mmol/L (ref 135–145)

## 2022-07-20 LAB — HIV ANTIBODY (ROUTINE TESTING W REFLEX): HIV Screen 4th Generation wRfx: NONREACTIVE

## 2022-07-20 LAB — GLUCOSE, CAPILLARY: Glucose-Capillary: 192 mg/dL — ABNORMAL HIGH (ref 70–99)

## 2022-07-20 LAB — LACTIC ACID, PLASMA
Lactic Acid, Venous: 1.7 mmol/L (ref 0.5–1.9)
Lactic Acid, Venous: 1.2 mmol/L (ref 0.5–1.9)

## 2022-07-20 LAB — CBG MONITORING, ED
Glucose-Capillary: 130 mg/dL — ABNORMAL HIGH (ref 70–99)
Glucose-Capillary: 115 mg/dL — ABNORMAL HIGH (ref 70–99)

## 2022-07-20 MED ORDER — FLUTICASONE PROPIONATE 50 MCG/ACT NA SUSP
2.0000 | Freq: Every day | NASAL | Status: DC
  Administered 2022-07-20 – 2022-07-24 (×2): 2 via NASAL
  Filled 2022-07-20 (×2): qty 16

## 2022-07-20 MED ORDER — INSULIN ASPART 100 UNIT/ML IJ SOLN
8.0000 [IU] | Freq: Three times a day (TID) | INTRAMUSCULAR | Status: DC
  Administered 2022-07-21 – 2022-07-24 (×9): 8 [IU] via SUBCUTANEOUS

## 2022-07-20 MED ORDER — ACETAMINOPHEN 325 MG PO TABS
650.0000 mg | ORAL_TABLET | Freq: Four times a day (QID) | ORAL | Status: DC | PRN
  Administered 2022-07-20 – 2022-07-23 (×6): 650 mg via ORAL
  Filled 2022-07-20 (×6): qty 2

## 2022-07-20 MED ORDER — ONDANSETRON HCL 4 MG/2ML IJ SOLN: 4.0000 mg | Freq: Four times a day (QID) | INTRAMUSCULAR | Status: DC | PRN

## 2022-07-20 MED ORDER — INSULIN GLARGINE-YFGN 100 UNIT/ML ~~LOC~~ SOLN
24.0000 [IU] | Freq: Every day | SUBCUTANEOUS | Status: DC
  Administered 2022-07-20 – 2022-07-24 (×5): 24 [IU] via SUBCUTANEOUS
  Filled 2022-07-20 (×6): qty 0.24

## 2022-07-20 MED ORDER — ADULT MULTIVITAMIN W/MINERALS CH
1.0000 | ORAL_TABLET | Freq: Every day | ORAL | Status: DC
  Administered 2022-07-20 – 2022-07-24 (×4): 1 via ORAL
  Filled 2022-07-20 (×5): qty 1

## 2022-07-20 MED ORDER — HEPARIN SODIUM (PORCINE) 5000 UNIT/ML IJ SOLN
5000.0000 [IU] | Freq: Three times a day (TID) | INTRAMUSCULAR | Status: DC
  Administered 2022-07-20 – 2022-07-24 (×10): 5000 [IU] via SUBCUTANEOUS
  Filled 2022-07-20 (×10): qty 1

## 2022-07-20 MED ORDER — VANCOMYCIN HCL IN DEXTROSE 1-5 GM/200ML-% IV SOLN
1000.0000 mg | Freq: Once | INTRAVENOUS | Status: AC
  Administered 2022-07-20: 1000 mg via INTRAVENOUS
  Filled 2022-07-20: qty 200

## 2022-07-20 MED ORDER — POTASSIUM CHLORIDE CRYS ER 20 MEQ PO TBCR
40.0000 meq | EXTENDED_RELEASE_TABLET | Freq: Once | ORAL | Status: AC
  Administered 2022-07-20: 40 meq via ORAL
  Filled 2022-07-20: qty 2

## 2022-07-20 MED ORDER — DIPHENHYDRAMINE HCL 25 MG PO CAPS
25.0000 mg | ORAL_CAPSULE | Freq: Four times a day (QID) | ORAL | Status: DC | PRN
  Administered 2022-07-20 – 2022-07-23 (×2): 25 mg via ORAL
  Filled 2022-07-20 (×3): qty 1

## 2022-07-20 MED ORDER — INSULIN ASPART 100 UNIT/ML IJ SOLN
0.0000 [IU] | Freq: Three times a day (TID) | INTRAMUSCULAR | Status: DC
  Administered 2022-07-21: 3 [IU] via SUBCUTANEOUS
  Administered 2022-07-21 (×2): 2 [IU] via SUBCUTANEOUS
  Administered 2022-07-23 – 2022-07-24 (×3): 5 [IU] via SUBCUTANEOUS

## 2022-07-20 MED ORDER — PIPERACILLIN-TAZOBACTAM 3.375 G IVPB 30 MIN
3.3750 g | Freq: Once | INTRAVENOUS | Status: AC
  Administered 2022-07-20: 3.375 g via INTRAVENOUS
  Filled 2022-07-20: qty 50

## 2022-07-20 MED ORDER — SODIUM CHLORIDE 0.9 % IV BOLUS
1000.0000 mL | Freq: Once | INTRAVENOUS | Status: AC
  Administered 2022-07-20: 1000 mL via INTRAVENOUS

## 2022-07-20 MED ORDER — OXYCODONE HCL 5 MG PO TABS
5.0000 mg | ORAL_TABLET | Freq: Four times a day (QID) | ORAL | Status: DC | PRN
  Administered 2022-07-20: 5 mg via ORAL
  Filled 2022-07-20: qty 1

## 2022-07-20 MED ORDER — BISACODYL 5 MG PO TBEC: 5.0000 mg | DELAYED_RELEASE_TABLET | Freq: Every day | ORAL | Status: DC | PRN

## 2022-07-20 MED ORDER — ATORVASTATIN CALCIUM 40 MG PO TABS
40.0000 mg | ORAL_TABLET | Freq: Every evening | ORAL | Status: DC
  Administered 2022-07-20 – 2022-07-23 (×3): 40 mg via ORAL
  Filled 2022-07-20 (×3): qty 1

## 2022-07-20 MED ORDER — IBUPROFEN 200 MG PO TABS: 200.0000 mg | ORAL_TABLET | Freq: Three times a day (TID) | ORAL | Status: DC | PRN

## 2022-07-20 MED ORDER — VANCOMYCIN HCL 1250 MG/250ML IV SOLN
1250.0000 mg | Freq: Two times a day (BID) | INTRAVENOUS | Status: DC
  Administered 2022-07-21 – 2022-07-23 (×6): 1250 mg via INTRAVENOUS
  Filled 2022-07-20 (×8): qty 250

## 2022-07-20 MED ORDER — SODIUM CHLORIDE 0.9 % IV SOLN
3.0000 g | Freq: Four times a day (QID) | INTRAVENOUS | Status: DC
  Administered 2022-07-20 – 2022-07-24 (×14): 3 g via INTRAVENOUS
  Filled 2022-07-20 (×15): qty 8

## 2022-07-20 MED ORDER — LOSARTAN POTASSIUM 50 MG PO TABS
100.0000 mg | ORAL_TABLET | Freq: Every day | ORAL | Status: DC
  Administered 2022-07-20 – 2022-07-24 (×5): 100 mg via ORAL
  Filled 2022-07-20 (×4): qty 2
  Filled 2022-07-20: qty 4

## 2022-07-20 MED ORDER — LACTATED RINGERS IV SOLN: INTRAVENOUS | Status: DC

## 2022-07-20 MED ORDER — ACETAMINOPHEN 650 MG RE SUPP: 650.0000 mg | Freq: Four times a day (QID) | RECTAL | Status: DC | PRN

## 2022-07-20 MED ORDER — ONDANSETRON HCL 4 MG PO TABS: 4.0000 mg | ORAL_TABLET | Freq: Four times a day (QID) | ORAL | Status: DC | PRN

## 2022-07-20 MED ORDER — HYDROCHLOROTHIAZIDE 25 MG PO TABS
25.0000 mg | ORAL_TABLET | Freq: Every day | ORAL | Status: DC
  Administered 2022-07-20 – 2022-07-24 (×5): 25 mg via ORAL
  Filled 2022-07-20 (×5): qty 1

## 2022-07-20 NOTE — Assessment & Plan Note (Signed)
-   Resume home atorvastatin 40 mg every evening

## 2022-07-20 NOTE — H&P (Signed)
History and Physical  [facility]  MOSES ODOHERTY HDQ:[id number] DOB: 09/28/1971 DOA: 07/20/2022  PCP: Levin Erp, MD  Vascular surgery: Dr. Chestine Spore Patient coming from: Home  Level of care: Med-Surg  I have personally briefly reviewed patient's old medical records in [facility] Link  Chief Complaint: toe infection  HPI: Sean Carter is a 51 year old gentleman with significant peripheral vascular disease, history of gangrene of left great toe, hypertension, diabetes mellitus, status post amputation of left great toe presented to the emergency department complaining of right second toe swelling redness and blackening since 3 days ago.  He denies fever and chills.  He denies pain.  He has insensate feet.  He is followed by Dr. Chestine Spore with vascular surgery who had advised him to get evaluated if he had worsening of the toe over the weekend.  Patient also has a nonproductive cough and has had fever at home up to 103.  Patient has gangrene in the left great toe and vascular surgery was consulted Dr. Randie Heinz requested patient be admitted to [facility] for a formal vascular surgery consultation.  Patient has been started on broad-spectrum IV antibiotics after blood cultures obtained.  No lactic acidosis noted.  His vital signs are stable and he is being admitted to [facility] for further management and vascular consultation.   Past Medical History:  Diagnosis Date   Blurry vision 07/10/2020   Diabetes mellitus without complication ([name])    Gangrene of toe of left foot ([name])    Sepsis due to Streptococcus, group B ([name]) 06/20/2020   Streptococcal bacteremia     Past Surgical History:  Procedure Laterality Date   AMPUTATION Left 05/31/2020   Procedure: Ray Amputation of Left Great Toe with Negative Pressure Vac Placement;  Surgeon: Cephus Shelling, MD;  Location: [facility] OR;  Service: Vascular;  Laterality: Left;     reports that he has never smoked. He has never used smokeless tobacco. He  reports that he does not use drugs. No history on file for alcohol use.  Allergies  Allergen Reactions   Scallops [Shellfish Allergy] Anaphylaxis    History reviewed. No pertinent family history.  Prior to Admission medications   Medication Sig Start Date End Date Taking? Authorizing Provider  atorvastatin (LIPITOR) 40 MG tablet Take 1 tablet by mouth once daily 06/10/22  Yes Levin Erp, MD  diphenhydrAMINE (BENADRYL) 25 MG tablet Take 25 mg by mouth every 6 (six) hours as needed for itching, allergies or sleep.   Yes [provider]  fluticasone (FLONASE) 50 MCG/ACT nasal spray Place 2 sprays into both nostrils daily. 06/29/22  Yes Levin Erp, MD  hydrochlorothiazide (HYDRODIURIL) 25 MG tablet Take 1 tablet by mouth once daily 06/10/22  Yes Levin Erp, MD  ibuprofen (ADVIL) 600 MG tablet Take 1 tablet (600 mg total) by mouth every 8 (eight) hours as needed. Patient taking differently: Take 200 mg by mouth every 8 (eight) hours as needed for mild pain. 03/14/21  Yes Simmons-Robinson, Makiera, MD  Insulin Glargine (BASAGLAR KWIKPEN) 100 UNIT/ML Inject 28 Units into the skin daily. 11/20/21  Yes Brimage, Vondra, DO  insulin lispro (HUMALOG) 100 UNIT/ML injection Inject 0.08 mLs (8 Units total) into the skin 3 (three) times daily before meals. 11/20/21  Yes Brimage, Vondra, DO  losartan (COZAAR) 100 MG tablet Take 1 tablet (100 mg total) by mouth daily. 06/10/22 08/09/22 Yes Levin Erp, MD  Multiple Vitamin (MULTIVITAMIN) tablet Take 1 tablet by mouth daily.   Yes [provider]  Blood Gluc Meter Disp-Strips (RELION ALL-IN-ONE) DEVI 1 each by Does not apply route 3 (three) times daily as needed. 07/02/20   Katha Cabal, DO  ReliOn Lancet Devices 30G MISC 1 each by Does not apply route 3 (three) times daily as needed. 07/02/20   Brimage, Seward Meth, DO  ReliOn Lancets Thin 26G MISC 1 each by Does not apply route 3 (three) times daily as needed. 10/04/20   Westley Chandler, MD  Stann Ore INSULIN SYRINGE 30G X 1/2" 0.3 ML MISC See admin instructions.  06/03/20   [provider]    Physical Exam: Vitals:   07/20/22 0741 07/20/22 0741  BP:  (!) 167/86  Pulse:  88  Resp:  17  Temp:  98.7 F (37.1 C)  TempSrc:  Oral  SpO2:  100%  Weight: 93.7 kg   Height: 5\' 8"  (1.727 m)     Constitutional: NAD, calm, comfortable Eyes: PERRL, lids and conjunctivae normal ENMT: Mucous membranes are moist. Posterior pharynx clear of any exudate or lesions.Normal dentition.  Neck: normal, supple, no masses, no thyromegaly Respiratory: clear to auscultation bilaterally, no wheezing, no crackles. Normal respiratory effort. No accessory muscle use.  Cardiovascular: normal s1, s2 sounds, no murmurs / rubs / gallops. No extremity edema. 2+ pedal pulses. No carotid bruits.  Abdomen: no tenderness, no masses palpated. No hepatosplenomegaly. Bowel sounds positive.  Musculoskeletal: gangrenous left 2nd toe with surrounding erythema     Skin: see photos Neurologic: CN 2-12 grossly intact. Sensation intact, DTR normal. Strength 5/5 in all 4.  Psychiatric: Normal judgment and insight. Alert and oriented x 3. Normal mood.   Labs on Admission: I have personally reviewed following labs and imaging studies  CBC: Recent Labs  Lab 07/20/22 1058  WBC 15.1*  NEUTROABS 11.8*  HGB 10.9*  HCT 32.6*  MCV 86.9  PLT 351   Basic Metabolic Panel: Recent Labs  Lab 07/20/22 1058  NA 135  K 3.4*  CL 96*  CO2 30  GLUCOSE 152*  BUN 23*  CREATININE 1.00  CALCIUM 8.4*   GFR: Estimated Creatinine Clearance: 97 mL/min (by C-G formula based on SCr of 1 mg/dL). Liver Function Tests: No results for input(s): "AST", "ALT", "ALKPHOS", "BILITOT", "PROT", "ALBUMIN" in the last 168 hours. No results for input(s): "LIPASE", "AMYLASE" in the last 168 hours. No results for input(s): "AMMONIA" in the last 168 hours. Coagulation Profile: No results for input(s): "INR",  "PROTIME" in the last 168 hours. Cardiac Enzymes: No results for input(s): "CKTOTAL", "CKMB", "CKMBINDEX", "TROPONINI" in the last 168 hours. BNP (last 3 results) No results for input(s): "PROBNP" in the last 8760 hours. HbA1C: No results for input(s): "HGBA1C" in the last 72 hours. CBG: Recent Labs  Lab 07/20/22 1014  GLUCAP 130*   Lipid Profile: No results for input(s): "CHOL", "HDL", "LDLCALC", "TRIG", "CHOLHDL", "LDLDIRECT" in the last 72 hours. Thyroid Function Tests: No results for input(s): "TSH", "T4TOTAL", "FREET4", "T3FREE", "THYROIDAB" in the last 72 hours. Anemia Panel: No results for input(s): "VITAMINB12", "FOLATE", "FERRITIN", "TIBC", "IRON", "RETICCTPCT" in the last 72 hours. Urine analysis:    Component Value Date/Time   COLORURINE YELLOW 05/22/2022 1139   APPEARANCEUR CLEAR 05/22/2022 1139   LABSPEC 1.016 05/22/2022 1139   PHURINE 5.0 05/22/2022 1139   GLUCOSEU NEGATIVE 05/22/2022 1139   HGBUR NEGATIVE 05/22/2022 1139   BILIRUBINUR NEGATIVE 05/22/2022 1139   KETONESUR NEGATIVE 05/22/2022 1139   PROTEINUR NEGATIVE 05/22/2022 1139   NITRITE NEGATIVE 05/22/2022 1139   LEUKOCYTESUR NEGATIVE  05/22/2022 1139    Radiological Exams on Admission: DG Chest Portable 1 View  Result Date: 07/20/2022 CLINICAL DATA:  Cough since Friday EXAM: PORTABLE CHEST 1 VIEW COMPARISON:  Portable exam 0932 hours compared to 06/04/2020 FINDINGS: Upper normal heart size. Mediastinal contours and pulmonary vascularity normal. Minimal RIGHT basilar atelectasis. Lungs otherwise clear. No pulmonary infiltrate, pleural effusion, or pneumothorax. Osseous structures unremarkable. IMPRESSION: Minimal RIGHT basilar atelectasis. Electronically Signed   By: Lavonia Dana M.D.   On: 07/20/2022 10:01   DG Foot Complete Right  Result Date: 07/20/2022 CLINICAL DATA:  RIGHT second toe injury, necrotic, diabetes mellitus EXAM: RIGHT FOOT COMPLETE - 3+ VIEW COMPARISON:  None Available. FINDINGS: Dressing  artifacts and marked soft tissue swelling at second toe. Osseous demineralization. Joint spaces preserved. Unable to exclude foci of soft tissue gas at second toe due to dressing artifacts. Bone destruction at lateral aspect base of proximal phalanx second toe consistent with osteomyelitis. No acute fracture, dislocation, or additional bone destruction. Plantar calcaneal spur. Additional spur formation at dorsal margin of talonavicular joint. IMPRESSION: Extensive soft tissue swelling and dressing artifacts at RIGHT second toe with evidence of osteomyelitis at the base of the distal phalanx. Electronically Signed   By: Lavonia Dana M.D.   On: 07/20/2022 10:00    Assessment/Plan Principal Problem:   Necrotic toes ([name]) Active Problems:   Hypokalemia   Hypertension associated with diabetes ([place])   Diabetes mellitus type 2 with peripheral artery disease ([name])   Mixed hyperlipidemia   Diabetic retinopathy ([name])   Gangrene of toe ([name])   Leukocytosis   Assessment and Plan: * Necrotic toes ([place]) - Formal vascular surgery consultation requested with Dr. Donzetta Matters  Leukocytosis - Secondary to cellulitis and gangrene of the left great toe - Follow and trend CBC with differential  Gangrene of toe ([place]) - Discussed with Dr. Donzetta Matters with vascular surgery service who will see patient in consultation at [facility] -Continue broad-spectrum antibiotic coverage -Follow-up blood cultures  Diabetic retinopathy [facility]) - Outpatient follow-up with ophthalmology  Mixed hyperlipidemia - Resume home atorvastatin 40 mg every evening  Diabetes mellitus type 2 with peripheral artery disease ([name]) - Follow-up hemoglobin A1c - Resume home basal and prandial insulin coverage, adding supplemental sliding scale coverage with frequent CBG monitoring  Hypertension associated with diabetes ([place]) - Resume home medications and follow  Hypokalemia - Repleted orally   DVT prophylaxis:  [address] heparin   Code Status: full    Family Communication:   Disposition Plan: TBD   Consults called: vascular surgery Dr. Donzetta Matters   Admission status: INP  Level of care: Med-Surg Irwin Brakeman MD [facility] How to contact the [facility] Attending or Consulting provider [place] or covering provider during after hours 7P -7A, for this patient?  Check the care team in [facility] and look for a) attending/consulting [facility] provider listed and b) the [facility] team listed Log into [email or web address] and use [facility]'s universal password to access. If you do not have the password, please contact the hospital operator. Locate the [facility] provider you are looking for under [facility] and page to a number that you can be directly reached. If you still have difficulty reaching the provider, please page the [facility] (Director on Call) for the Hospitalists listed on amion for assistance.   If 7PM-7AM, please contact night-coverage [email or web address] Password [facility]  07/20/2022, 12:46 PM

## 2022-07-20 NOTE — ED Notes (Signed)
Pt given ice chips

## 2022-07-20 NOTE — ED Provider Notes (Signed)
[facility] EMERGENCY DEPARTMENT Provider Note   CSN: [id number] Arrival date & time: 07/20/22  4580     History  Chief Complaint  Patient presents with   Toe Pain    Sean Carter is a 51 y.o. male with a history including hypertension, diabetes, peripheral arterial disease, history of amputation of left great toe secondary to gangrene, presenting for evaluation of right second toe swelling, redness and now with blackening of the distal toe since Friday.  He describes tripping and falling about 3 weeks ago which she suspects may be the initial source of injury of his foot.  He has peripheral neuropathy and really denies any significant pain.  He has been in contact with his vascular surgeon Dr. Chestine Spore last week advising him that he had some redness and swelling of this toe, was advised to get evaluated if his symptoms worsen and he endorses darkening of the toe over the weekend.  He also reports he has had a nonproductive cough since last week, also describes fevers on 2 occasions with Tmax of 103 since last week.  States the last time he had gangrene of the left toe he was also septic.  He has had no treatments prior to arrival and has not had his morning medications including his insulin.  The history is provided by the patient.       Home Medications Prior to Admission medications   Medication Sig Start Date End Date Taking? Authorizing Provider  atorvastatin (LIPITOR) 40 MG tablet Take 1 tablet by mouth once daily 06/10/22  Yes Levin Erp, MD  diphenhydrAMINE (BENADRYL) 25 MG tablet Take 25 mg by mouth every 6 (six) hours as needed for itching, allergies or sleep.   Yes [provider]  fluticasone (FLONASE) 50 MCG/ACT nasal spray Place 2 sprays into both nostrils daily. 06/29/22  Yes Levin Erp, MD  hydrochlorothiazide (HYDRODIURIL) 25 MG tablet Take 1 tablet by mouth once daily 06/10/22  Yes Levin Erp, MD  ibuprofen (ADVIL) 600 MG tablet Take 1 tablet (600  mg total) by mouth every 8 (eight) hours as needed. Patient taking differently: Take 200 mg by mouth every 8 (eight) hours as needed for mild pain. 03/14/21  Yes Simmons-Robinson, Makiera, MD  Insulin Glargine (BASAGLAR KWIKPEN) 100 UNIT/ML Inject 28 Units into the skin daily. 11/20/21  Yes Brimage, Vondra, DO  insulin lispro (HUMALOG) 100 UNIT/ML injection Inject 0.08 mLs (8 Units total) into the skin 3 (three) times daily before meals. 11/20/21  Yes Brimage, Vondra, DO  losartan (COZAAR) 100 MG tablet Take 1 tablet (100 mg total) by mouth daily. 06/10/22 08/09/22 Yes Levin Erp, MD  Multiple Vitamin (MULTIVITAMIN) tablet Take 1 tablet by mouth daily.   Yes [provider]  Blood Gluc Meter Disp-Strips (RELION ALL-IN-ONE) DEVI 1 each by Does not apply route 3 (three) times daily as needed. 07/02/20   Katha Cabal, DO  ReliOn Lancet Devices 30G MISC 1 each by Does not apply route 3 (three) times daily as needed. 07/02/20   Brimage, Seward Meth, DO  ReliOn Lancets Thin 26G MISC 1 each by Does not apply route 3 (three) times daily as needed. 10/04/20   Westley Chandler, MD  Stann Ore INSULIN SYRINGE 30G X 1/2" 0.3 ML MISC See admin instructions.  06/03/20   [provider]      Allergies    Scallops [shellfish allergy]    Review of Systems   Review of Systems  Constitutional:  Positive for fever.  HENT:  Negative  for congestion.   Eyes: Negative.   Respiratory:  Positive for cough. Negative for chest tightness and shortness of breath.   Cardiovascular:  Negative for chest pain.  Gastrointestinal:  Negative for abdominal pain, nausea and vomiting.  Genitourinary: Negative.   Musculoskeletal:  Negative for arthralgias, joint swelling and neck pain.  Skin:  Positive for color change and wound. Negative for rash.  Neurological:  Negative for dizziness, weakness, light-headedness, numbness and headaches.  Psychiatric/Behavioral: Negative.    All other systems reviewed and are  negative.   Physical Exam Updated Vital Signs BP (!) 167/86   Pulse 88   Temp 98.7 F (37.1 C) (Oral)   Resp 17   Ht 5\' 8"  (1.727 m)   Wt 93.7 kg   SpO2 100%   BMI 31.41 kg/m  Physical Exam Vitals and nursing note reviewed.  Constitutional:      Appearance: He is well-developed.  HENT:     Head: Normocephalic and atraumatic.  Eyes:     Conjunctiva/sclera: Conjunctivae normal.  Cardiovascular:     Rate and Rhythm: Normal rate and regular rhythm.     Heart sounds: Normal heart sounds.  Pulmonary:     Effort: Pulmonary effort is normal.     Breath sounds: Normal breath sounds. No wheezing or rhonchi.  Abdominal:     General: Bowel sounds are normal.  Musculoskeletal:        General: Normal range of motion.     Cervical back: Normal range of motion.     Right foot: Swelling present. No tenderness.     Comments: Necrotic right 2nd toe with cellulitis dorsal distal toe.    Skin:    General: Skin is warm and dry.  Neurological:     Mental Status: He is alert.         ED Results / Procedures / Treatments   Labs (all labs ordered are listed, but only abnormal results are displayed) Labs Reviewed  CBC WITH DIFFERENTIAL/PLATELET - Abnormal; Notable for the following components:      Result Value   WBC 15.1 (*)    RBC 3.75 (*)    Hemoglobin 10.9 (*)    HCT 32.6 (*)    Neutro Abs 11.8 (*)    Monocytes Absolute 1.3 (*)    Abs Immature Granulocytes 0.08 (*)    All other components within normal limits  BASIC METABOLIC PANEL - Abnormal; Notable for the following components:   Potassium 3.4 (*)    Chloride 96 (*)    Glucose, Bld 152 (*)    BUN 23 (*)    Calcium 8.4 (*)    All other components within normal limits  CBG MONITORING, ED - Abnormal; Notable for the following components:   Glucose-Capillary 130 (*)    All other components within normal limits  CULTURE, BLOOD (ROUTINE X 2)  CULTURE, BLOOD (ROUTINE X 2)  LACTIC ACID, PLASMA  LACTIC ACID, PLASMA     EKG None  Radiology DG Chest Portable 1 View  Result Date: 07/20/2022 CLINICAL DATA:  Cough since Friday EXAM: PORTABLE CHEST 1 VIEW COMPARISON:  Portable exam 0932 hours compared to 06/04/2020 FINDINGS: Upper normal heart size. Mediastinal contours and pulmonary vascularity normal. Minimal RIGHT basilar atelectasis. Lungs otherwise clear. No pulmonary infiltrate, pleural effusion, or pneumothorax. Osseous structures unremarkable. IMPRESSION: Minimal RIGHT basilar atelectasis. Electronically Signed   By: Lavonia Dana M.D.   On: 07/20/2022 10:01   DG Foot Complete Right  Result Date: 07/20/2022 CLINICAL DATA:  RIGHT second toe injury, necrotic, diabetes mellitus EXAM: RIGHT FOOT COMPLETE - 3+ VIEW COMPARISON:  None Available. FINDINGS: Dressing artifacts and marked soft tissue swelling at second toe. Osseous demineralization. Joint spaces preserved. Unable to exclude foci of soft tissue gas at second toe due to dressing artifacts. Bone destruction at lateral aspect base of proximal phalanx second toe consistent with osteomyelitis. No acute fracture, dislocation, or additional bone destruction. Plantar calcaneal spur. Additional spur formation at dorsal margin of talonavicular joint. IMPRESSION: Extensive soft tissue swelling and dressing artifacts at RIGHT second toe with evidence of osteomyelitis at the base of the distal phalanx. Electronically Signed   By: Ulyses Southward M.D.   On: 07/20/2022 10:00    Procedures Procedures    Medications Ordered in ED Medications  vancomycin (VANCOCIN) IVPB 1000 mg/200 mL premix (1,000 mg Intravenous New Bag/Given 07/20/22 1133)  potassium chloride SA (KLOR-CON M) CR tablet 40 mEq (has no administration in time range)  sodium chloride 0.9 % bolus 1,000 mL (1,000 mLs Intravenous New Bag/Given 07/20/22 1030)  piperacillin-tazobactam (ZOSYN) IVPB 3.375 g (0 g Intravenous Stopped 07/20/22 1152)    ED Course/ Medical Decision Making/ A&P                            Medical Decision Making Patient presenting with an obvious infected and necrotic right second toe, imaging confirming osteomyelitis as well.  Cellulitis at the dorsum of his foot.  Pain-free, although this is a patient with diabetic neuropathy.  He endorses fevers at home, he is afebrile here and his first lactate is negative.  Blood cultures have been collected however.  He is started on Vanco and Zosyn IV.  Amount and/or Complexity of Data Reviewed Labs: ordered.    Details: WBC count of 15.1, hemoglobin of 10.9, glucose of 152. Radiology: ordered. Discussion of management or test interpretation with external provider(s): Discussed with Dr. Randie Heinz of vascular surgery who has asked for a hospitalist admission at [facility] and they will plan vascular consultation with this patient.  Discussed with Dr. Laural Benes of the hospitalist service who accepts patient for admission and transfer to [facility].  Risk Prescription drug management. Decision regarding hospitalization.           Final Clinical Impression(s) / ED Diagnoses Final diagnoses:  Diabetic osteomyelitis [facility])  Peripheral vascular disease [facility])    Rx / DC Orders ED Discharge Orders     None         Victoriano Lain 07/20/22 1204    Bethann Berkshire, MD 07/23/22 1022

## 2022-07-20 NOTE — Assessment & Plan Note (Addendum)
-   Secondary to cellulitis and gangrene of the left great toe - Follow and trend CBC with differential

## 2022-07-20 NOTE — Assessment & Plan Note (Signed)
-   Discussed with Dr. Donzetta Matters with vascular surgery service who will see patient in consultation at [facility] broad-spectrum antibiotic coverage -Follow-up blood cultures

## 2022-07-20 NOTE — Assessment & Plan Note (Signed)
-   Follow-up hemoglobin A1c - Resume home basal and prandial insulin coverage, adding supplemental sliding scale coverage with frequent CBG monitoring

## 2022-07-20 NOTE — Assessment & Plan Note (Signed)
-   Formal vascular surgery consultation requested with Dr. Donzetta Matters

## 2022-07-20 NOTE — Assessment & Plan Note (Signed)
-   Resume home medications and follow

## 2022-07-20 NOTE — Hospital Course (Signed)
51 year old gentleman with significant peripheral vascular disease, history of gangrene of left great toe, hypertension, diabetes mellitus, status post amputation of left great toe presented to the emergency department complaining of right second toe swelling redness and blackening since 3 days ago.  He denies fever and chills.  He denies pain.  He has insensate feet.  He is followed by Dr. Carlis Abbott with vascular surgery who had advised him to get evaluated if he had worsening of the toe over the weekend.  Patient also has a nonproductive cough and has had fever at home up to 103.  Patient has gangrene in the left great toe and vascular surgery was consulted Dr. Donzetta Matters requested patient be admitted to [facility] for a formal vascular surgery consultation.  Patient has been started on broad-spectrum IV antibiotics after blood cultures obtained.  No lactic acidosis noted.  His vital signs are stable and he is being admitted to [facility] for further management and vascular consultation.

## 2022-07-20 NOTE — Assessment & Plan Note (Signed)
-   Outpatient follow-up with ophthalmology

## 2022-07-20 NOTE — Progress Notes (Addendum)
Pharmacy Antibiotic Note  Sean Carter is a 51 y.o. male admitted on 07/20/2022 with cellulitis and wound infection .  Pharmacy has been consulted for vancomycin and unasyn dosing.  Plan: Vancomycin 2000 mg IV x 1 dose. Vancomycin 1250 mg IV every 12 hours. Unasyn 3000 mg IV every 6 hours. Monitor labs, c/s, and vanco level as indicated  Height: 5\' 8"  (172.7 cm) Weight: 93.7 kg (206 lb 9.1 oz) IBW/kg (Calculated) : 68.4  Temp (24hrs), Avg:98.7 F (37.1 C), Min:98.7 F (37.1 C), Max:98.7 F (37.1 C)  Recent Labs  Lab 07/20/22 1058 07/20/22 1235  WBC 15.1* 14.1*  CREATININE 1.00 0.97  LATICACIDVEN 1.7  --     Estimated Creatinine Clearance: 100 mL/min (by C-G formula based on SCr of 0.97 mg/dL).    Allergies  Allergen Reactions   Scallops [Shellfish Allergy] Anaphylaxis    Antimicrobials this admission: Vanco 10/2 >> Unasyn 10/2 >> Zosyn 10/2  Microbiology results: 10/2 BCx: pending  Thank you for allowing pharmacy to be a part of this patient's care.  Margot Ables, PharmD Clinical Pharmacist 07/20/2022 1:20 PM

## 2022-07-20 NOTE — Assessment & Plan Note (Signed)
-   Repleted orally

## 2022-07-20 NOTE — ED Triage Notes (Signed)
Pt to the ED with complaints of an injury to his second toe on his right foot.  Pt is a diabetic.

## 2022-07-20 NOTE — Plan of Care (Signed)
  Problem: Clinical Measurements: Goal: Will remain free from infection Outcome: Not Progressing   Problem: Safety: Goal: Ability to remain free from injury will improve Outcome: Not Progressing   Problem: Skin Integrity: Goal: Risk for impaired skin integrity will decrease Outcome: Not Progressing   

## 2022-07-21 ENCOUNTER — Inpatient Hospital Stay (HOSPITAL_COMMUNITY): Payer: 59

## 2022-07-21 DIAGNOSIS — I739 Peripheral vascular disease, unspecified: Secondary | ICD-10-CM | POA: Diagnosis not present

## 2022-07-21 DIAGNOSIS — I96 Gangrene, not elsewhere classified: Secondary | ICD-10-CM

## 2022-07-21 LAB — CBC WITH DIFFERENTIAL/PLATELET
Abs Immature Granulocytes: 0.08 10*3/uL — ABNORMAL HIGH (ref 0.00–0.07)
Basophils Absolute: 0.1 10*3/uL (ref 0.0–0.1)
Basophils Relative: 0 %
Eosinophils Absolute: 0.1 10*3/uL (ref 0.0–0.5)
Eosinophils Relative: 1 %
HCT: 28 % — ABNORMAL LOW (ref 39.0–52.0)
Hemoglobin: 9.6 g/dL — ABNORMAL LOW (ref 13.0–17.0)
Immature Granulocytes: 1 %
Lymphocytes Relative: 17 %
Lymphs Abs: 2.8 10*3/uL (ref 0.7–4.0)
MCH: 29.4 pg (ref 26.0–34.0)
MCHC: 34.3 g/dL (ref 30.0–36.0)
MCV: 85.6 fL (ref 80.0–100.0)
Monocytes Absolute: 1.5 10*3/uL — ABNORMAL HIGH (ref 0.1–1.0)
Monocytes Relative: 9 %
Neutro Abs: 11.7 10*3/uL — ABNORMAL HIGH (ref 1.7–7.7)
Neutrophils Relative %: 72 %
Platelets: 299 10*3/uL (ref 150–400)
RBC: 3.27 MIL/uL — ABNORMAL LOW (ref 4.22–5.81)
RDW: 12.4 % (ref 11.5–15.5)
WBC: 16.2 10*3/uL — ABNORMAL HIGH (ref 4.0–10.5)
nRBC: 0 % (ref 0.0–0.2)

## 2022-07-21 LAB — GLUCOSE, CAPILLARY
Glucose-Capillary: 139 mg/dL — ABNORMAL HIGH (ref 70–99)
Glucose-Capillary: 144 mg/dL — ABNORMAL HIGH (ref 70–99)
Glucose-Capillary: 142 mg/dL — ABNORMAL HIGH (ref 70–99)
Glucose-Capillary: 165 mg/dL — ABNORMAL HIGH (ref 70–99)
Glucose-Capillary: 179 mg/dL — ABNORMAL HIGH (ref 70–99)
Glucose-Capillary: 190 mg/dL — ABNORMAL HIGH (ref 70–99)

## 2022-07-21 LAB — LACTIC ACID, PLASMA: Lactic Acid, Venous: 1.1 mmol/L (ref 0.5–1.9)

## 2022-07-21 MED ORDER — IPRATROPIUM-ALBUTEROL 0.5-2.5 (3) MG/3ML IN SOLN: 3.0000 mL | RESPIRATORY_TRACT | Status: DC | PRN

## 2022-07-21 MED ORDER — MONTELUKAST SODIUM 10 MG PO TABS
10.0000 mg | ORAL_TABLET | Freq: Every day | ORAL | Status: DC
  Administered 2022-07-21 – 2022-07-23 (×3): 10 mg via ORAL
  Filled 2022-07-21 (×3): qty 1

## 2022-07-21 MED ORDER — IBUPROFEN 400 MG PO TABS
400.0000 mg | ORAL_TABLET | Freq: Once | ORAL | Status: AC
  Administered 2022-07-21: 400 mg via ORAL
  Filled 2022-07-21: qty 1

## 2022-07-21 MED ORDER — IPRATROPIUM BROMIDE 0.02 % IN SOLN
0.5000 mg | Freq: Four times a day (QID) | RESPIRATORY_TRACT | Status: DC | PRN
  Administered 2022-07-21: 0.5 mg via RESPIRATORY_TRACT
  Filled 2022-07-21: qty 2.5

## 2022-07-21 MED ORDER — GUAIFENESIN 100 MG/5ML PO LIQD
5.0000 mL | ORAL | Status: DC | PRN
  Administered 2022-07-23 – 2022-07-24 (×4): 5 mL via ORAL
  Filled 2022-07-21 (×6): qty 5

## 2022-07-21 MED ORDER — HYDRALAZINE HCL 20 MG/ML IJ SOLN
10.0000 mg | INTRAMUSCULAR | Status: DC | PRN
  Administered 2022-07-23 – 2022-07-24 (×2): 10 mg via INTRAVENOUS
  Filled 2022-07-21 (×2): qty 1

## 2022-07-21 MED ORDER — METOPROLOL TARTRATE 5 MG/5ML IV SOLN: 5.0000 mg | INTRAVENOUS | Status: DC | PRN

## 2022-07-21 MED ORDER — TRAZODONE HCL 50 MG PO TABS: 50.0000 mg | ORAL_TABLET | Freq: Every evening | ORAL | Status: DC | PRN

## 2022-07-21 NOTE — Consult Note (Addendum)
Hospital Consult    Reason for Consult:  necrotic R 2nd toe Requesting Physician:  Amin, Ankit Chirag MD MRN #:  [id number]  History of Present Illness: Sean Carter is a 50 y.o. male with a past medical history of DM2, PAD, hypertension, and gangrene of left great toe requiring amputation.  He presented to the emergency room at [facility] on 07/20/2022 reporting right second toe swelling, redness, and necrosis since 3 days ago.  He states back in September this year he smashed his toe at [employer] and it was extremely bruised afterwards.  He thought it was healing up until 3 days ago when it began to blacken and have foul-smelling drainage.  He also endorses erythema of the area.  He endorses feeling "sick" like he had the flu.  At home he had a fever up to 103F.   Similarly the patient developed gangrene of his left great toe in 2021 after trauma to it.  Dr. Clark performed ray amputation of left great toe with wound VAC placement on 05/31/2020.  It has been well-healed for almost 2 years now.  He denies any rest pain or claudication.  He denies any other wounds on his lower extremities.  The pt is on a statin for cholesterol management.  The pt is not on a daily aspirin.   Other AC:   The pt is on losartan and HCTZ for hypertension.   The pt is diabetic.   Tobacco hx:  never  Past Medical History:  Diagnosis Date   Blurry vision 07/10/2020   Diabetes mellitus without complication ([name])    Gangrene of toe of left foot ([name])    Sepsis due to Streptococcus, group B ([name]) 06/20/2020   Streptococcal bacteremia     Past Surgical History:  Procedure Laterality Date   AMPUTATION Left 05/31/2020   Procedure: Ray Amputation of Left Great Toe with Negative Pressure Vac Placement;  Surgeon: Clark, Christopher J, MD;  Location: [facility] OR;  Service: Vascular;  Laterality: Left;    Allergies  Allergen Reactions   Scallops [Shellfish Allergy] Anaphylaxis    Prior to Admission medications    Medication Sig Start Date End Date Taking? Authorizing Provider  atorvastatin (LIPITOR) 40 MG tablet Take 1 tablet by mouth once daily 06/10/22  Yes Jagadish, Mayuri, MD  diphenhydrAMINE (BENADRYL) 25 MG tablet Take 25 mg by mouth every 6 (six) hours as needed for itching, allergies or sleep.   Yes [provider]  fluticasone (FLONASE) 50 MCG/ACT nasal spray Place 2 sprays into both nostrils daily. 06/29/22  Yes Jagadish, Mayuri, MD  hydrochlorothiazide (HYDRODIURIL) 25 MG tablet Take 1 tablet by mouth once daily 06/10/22  Yes Jagadish, Mayuri, MD  ibuprofen (ADVIL) 600 MG tablet Take 1 tablet (600 mg total) by mouth every 8 (eight) hours as needed. Patient taking differently: Take 200 mg by mouth every 8 (eight) hours as needed for mild pain. 03/14/21  Yes Simmons-Robinson, Makiera, MD  Insulin Glargine (BASAGLAR KWIKPEN) 100 UNIT/ML Inject 28 Units into the skin daily. 11/20/21  Yes Brimage, Vondra, DO  insulin lispro (HUMALOG) 100 UNIT/ML injection Inject 0.08 mLs (8 Units total) into the skin 3 (three) times daily before meals. 11/20/21  Yes Brimage, Vondra, DO  losartan (COZAAR) 100 MG tablet Take 1 tablet (100 mg total) by mouth daily. 06/10/22 08/09/22 Yes Jagadish, Mayuri, MD  Multiple Vitamin (MULTIVITAMIN) tablet Take 1 tablet by mouth daily.   Yes [provider]  Blood Gluc Meter Disp-Strips (RELION ALL-IN-ONE) DEVI 1   each by Does not apply route 3 (three) times daily as needed. 07/02/20   Lyndee Hensen, DO  ReliOn Lancet Devices 30G MISC 1 each by Does not apply route 3 (three) times daily as needed. 07/02/20   Brimage, Ronnette Juniper, DO  ReliOn Lancets Thin 26G MISC 1 each by Does not apply route 3 (three) times daily as needed. 10/04/20   Martyn Malay, MD  Flossie Buffy INSULIN SYRINGE 30G X 1/2" 0.3 ML MISC See admin instructions.  06/03/20   [provider]    Social History   Socioeconomic History   Marital status: Married    Spouse name: Not on file   Number of  children: Not on file   Years of education: Not on file   Highest education level: Not on file  Occupational History   Not on file  Tobacco Use   Smoking status: Never   Smokeless tobacco: Never  Vaping Use   Vaping Use: Never used  Substance and Sexual Activity   Alcohol use: Not on file   Drug use: Never   Sexual activity: Not on file  Other Topics Concern   Not on file  Social History Narrative   Not on file   Social Determinants of Health   Financial Resource Strain: Not on file  Food Insecurity: Not on file  Transportation Needs: Not on file  Physical Activity: Not on file  Stress: Not on file  Social Connections: Not on file  Intimate Partner Violence: Not on file     History reviewed. No pertinent family history.  ROS: [x]  Positive   [ ]  Negative   [ ]  All sytems reviewed and are negative  Cardiac: []  chest pain/pressure []  hx MI []  SOB   Vascular: []  pain in legs while walking []  pain in legs at rest []  pain in legs at night [x]  non-healing ulcers []  hx of DVT []  swelling in legs  Pulmonary: []  asthma/wheezing []  home O2  Neurologic: []  hx of CVA []  mini stroke   Hematologic: []  hx of cancer  Endocrine:   [x]  diabetes []  thyroid disease  GI []  GERD  GU: []  CKD/renal failure []  HD--[]  M/W/F or []  T/T/S  Psychiatric: []  anxiety []  depression  Musculoskeletal: []  arthritis []  joint pain  Integumentary: []  rashes []  ulcers  Constitutional: [x]  fever  []  chills  Physical Examination  Vitals:   07/21/22 1019 07/21/22 1210  BP: 126/74 136/69  Pulse: 79 76  Resp: 18 18  Temp: 98.6 F (37 C) 99.9 F (37.7 C)  SpO2: 98% 94%   Body mass index is 31.41 kg/m.  General:  WDWN in NAD Gait: Not observed HENT: WNL, normocephalic Pulmonary: normal non-labored breathing Cardiac: regular, without carotid bruit Abdomen:  soft, NT; aortic pulse is non palpable Skin: without rashes Vascular Exam/Pulses: Palpable right DP and PT  pulse Extremities: Necrotic R 2nd toe with ulceration and erythema extending to the 2nd metatarsal. Intact motor and sensation Musculoskeletal: no muscle wasting or atrophy  Neurologic: A&O X 3 Psychiatric:  The pt has Normal affect.  CBC    Component Value Date/Time   WBC 16.2 (H) 07/21/2022 0240   RBC 3.27 (L) 07/21/2022 0240   HGB 9.6 (L) 07/21/2022 0240   HCT 28.0 (L) 07/21/2022 0240   PLT 299 07/21/2022 0240   MCV 85.6 07/21/2022 0240   MCH 29.4 07/21/2022 0240   MCHC 34.3 07/21/2022 0240   RDW 12.4 07/21/2022 0240   LYMPHSABS 2.8 07/21/2022 0240   MONOABS  1.5 (H) 07/21/2022 0240   EOSABS 0.1 07/21/2022 0240   BASOSABS 0.1 07/21/2022 0240    BMET    Component Value Date/Time   NA 135 07/20/2022 1058   NA 142 11/17/2021 1732   K 3.4 (L) 07/20/2022 1058   CL 96 (L) 07/20/2022 1058   CO2 30 07/20/2022 1058   GLUCOSE 152 (H) 07/20/2022 1058   BUN 23 (H) 07/20/2022 1058   BUN 37 (H) 11/17/2021 1732   CREATININE 0.97 07/20/2022 1235   CREATININE 0.78 07/10/2020 0910   CALCIUM 8.4 (L) 07/20/2022 1058   GFRNONAA >60 07/20/2022 1235   GFRNONAA 106 07/10/2020 0910   GFRAA 123 07/10/2020 0910    COAGS: Lab Results  Component Value Date   INR 1.1 05/30/2020   INR 0.9 01/10/2008     Non-Invasive Vascular Imaging:   ABIs pending   ASSESSMENT/PLAN: This is a 50 y.o. male with a necrotic R 2nd toe   -The patient has palpable right DP and PT pulses -He has intact motor and sensation of his foot and toes -He has a necrotic right second toe with underlying infection.  X-ray of right foot demonstrates marked soft tissue swelling at the second toe with evidence of osteomyelitis at the base of the distal phalanx -He is on broad-spectrum antibiotics including vancomycin and ampicillin-sulbactam -He will need amputation of right second toe and possibly right great toe sometime this week -Dr. Severus Brodzinski to evaluate patient and determine further plan   McKenzi Schuh,  PA-C [facility] [phone]   I have independently interviewed and examined patient and agree with PA assessment and plan above.  Foul-smelling right second toe with underlying infection also wound at the base of the right first toe.  Plan will be for amputation tomorrow in the operating room with one of my partners.  I discussed with the patient he demonstrates good understanding and will be n.p.o. past midnight.  Suan Pyeatt C. Miryam Mcelhinney, MD [facility] [place] Office: [phone] Pager: [phone]  

## 2022-07-21 NOTE — Progress Notes (Signed)
Paged DR. Donzetta Matters about patient's arrival.

## 2022-07-21 NOTE — H&P (View-Only) (Signed)
Hospital Consult    Reason for Consult:  necrotic R 2nd toe Requesting Physician:  Damita Lack MD MRN #:  [id number]  History of Present Illness: Sean Carter is a 51 y.o. male with a past medical history of DM2, PAD, hypertension, and gangrene of left great toe requiring amputation.  He presented to the emergency room at [facility] on 07/20/2022 reporting right second toe swelling, redness, and necrosis since 3 days ago.  He states back in September this year he smashed his toe at [employer] and it was extremely bruised afterwards.  He thought it was healing up until 3 days ago when it began to blacken and have foul-smelling drainage.  He also endorses erythema of the area.  He endorses feeling "sick" like he had the flu.  At home he had a fever up to 103F.   Similarly the patient developed gangrene of his left great toe in 2021 after trauma to it.  Dr. Carlis Abbott performed ray amputation of left great toe with wound VAC placement on 05/31/2020.  It has been well-healed for almost 2 years now.  He denies any rest pain or claudication.  He denies any other wounds on his lower extremities.  The pt is on a statin for cholesterol management.  The pt is not on a daily aspirin.   Other AC:   The pt is on losartan and HCTZ for hypertension.   The pt is diabetic.   Tobacco hx:  never  Past Medical History:  Diagnosis Date   Blurry vision 07/10/2020   Diabetes mellitus without complication ([name])    Gangrene of toe of left foot ([place])    Sepsis due to Streptococcus, group B ([place]) 06/20/2020   Streptococcal bacteremia     Past Surgical History:  Procedure Laterality Date   AMPUTATION Left 05/31/2020   Procedure: Ray Amputation of Left Great Toe with Negative Pressure Vac Placement;  Surgeon: Marty Heck, MD;  Location: [place];  Service: Vascular;  Laterality: Left;    Allergies  Allergen Reactions   Scallops [Shellfish Allergy] Anaphylaxis    Prior to Admission medications    Medication Sig Start Date End Date Taking? Authorizing Provider  atorvastatin (LIPITOR) 40 MG tablet Take 1 tablet by mouth once daily 06/10/22  Yes Gerrit Heck, MD  diphenhydrAMINE (BENADRYL) 25 MG tablet Take 25 mg by mouth every 6 (six) hours as needed for itching, allergies or sleep.   Yes [provider]  fluticasone (FLONASE) 50 MCG/ACT nasal spray Place 2 sprays into both nostrils daily. 06/29/22  Yes Gerrit Heck, MD  hydrochlorothiazide (HYDRODIURIL) 25 MG tablet Take 1 tablet by mouth once daily 06/10/22  Yes Gerrit Heck, MD  ibuprofen (ADVIL) 600 MG tablet Take 1 tablet (600 mg total) by mouth every 8 (eight) hours as needed. Patient taking differently: Take 200 mg by mouth every 8 (eight) hours as needed for mild pain. 03/14/21  Yes Simmons-Robinson, Makiera, MD  Insulin Glargine (BASAGLAR KWIKPEN) 100 UNIT/ML Inject 28 Units into the skin daily. 11/20/21  Yes Brimage, Vondra, DO  insulin lispro (HUMALOG) 100 UNIT/ML injection Inject 0.08 mLs (8 Units total) into the skin 3 (three) times daily before meals. 11/20/21  Yes Brimage, Vondra, DO  losartan (COZAAR) 100 MG tablet Take 1 tablet (100 mg total) by mouth daily. 06/10/22 08/09/22 Yes Gerrit Heck, MD  Multiple Vitamin (MULTIVITAMIN) tablet Take 1 tablet by mouth daily.   Yes [provider]  Blood Gluc Meter Disp-Strips (RELION ALL-IN-ONE) DEVI 1  each by Does not apply route 3 (three) times daily as needed. 07/02/20   Lyndee Hensen, DO  ReliOn Lancet Devices 30G MISC 1 each by Does not apply route 3 (three) times daily as needed. 07/02/20   Brimage, Ronnette Juniper, DO  ReliOn Lancets Thin 26G MISC 1 each by Does not apply route 3 (three) times daily as needed. 10/04/20   Martyn Malay, MD  Flossie Buffy INSULIN SYRINGE 30G X 1/2" 0.3 ML MISC See admin instructions.  06/03/20   [provider]    Social History   Socioeconomic History   Marital status: Married    Spouse name: Not on file   Number of  children: Not on file   Years of education: Not on file   Highest education level: Not on file  Occupational History   Not on file  Tobacco Use   Smoking status: Never   Smokeless tobacco: Never  Vaping Use   Vaping Use: Never used  Substance and Sexual Activity   Alcohol use: Not on file   Drug use: Never   Sexual activity: Not on file  Other Topics Concern   Not on file  Social History Narrative   Not on file   Social Determinants of Health   Financial Resource Strain: Not on file  Food Insecurity: Not on file  Transportation Needs: Not on file  Physical Activity: Not on file  Stress: Not on file  Social Connections: Not on file  Intimate Partner Violence: Not on file     History reviewed. No pertinent family history.  ROS: [x]  Positive   [ ]  Negative   [ ]  All sytems reviewed and are negative  Cardiac: []  chest pain/pressure []  hx MI []  SOB   Vascular: []  pain in legs while walking []  pain in legs at rest []  pain in legs at night [x]  non-healing ulcers []  hx of DVT []  swelling in legs  Pulmonary: []  asthma/wheezing []  home O2  Neurologic: []  hx of CVA []  mini stroke   Hematologic: []  hx of cancer  Endocrine:   [x]  diabetes []  thyroid disease  GI []  GERD  GU: []  CKD/renal failure []  HD--[]  M/W/F or []  T/T/S  Psychiatric: []  anxiety []  depression  Musculoskeletal: []  arthritis []  joint pain  Integumentary: []  rashes []  ulcers  Constitutional: [x]  fever  []  chills  Physical Examination  Vitals:   07/21/22 1019 07/21/22 1210  BP: 126/74 136/69  Pulse: 79 76  Resp: 18 18  Temp: 98.6 F (37 C) 99.9 F (37.7 C)  SpO2: 98% 94%   Body mass index is 31.41 kg/m.  General:  WDWN in NAD Gait: Not observed HENT: WNL, normocephalic Pulmonary: normal non-labored breathing Cardiac: regular, without carotid bruit Abdomen:  soft, NT; aortic pulse is non palpable Skin: without rashes Vascular Exam/Pulses: Palpable right DP and PT  pulse Extremities: Necrotic R 2nd toe with ulceration and erythema extending to the 2nd metatarsal. Intact motor and sensation Musculoskeletal: no muscle wasting or atrophy  Neurologic: A&O X 3 Psychiatric:  The pt has Normal affect.  CBC    Component Value Date/Time   WBC 16.2 (H) 07/21/2022 0240   RBC 3.27 (L) 07/21/2022 0240   HGB 9.6 (L) 07/21/2022 0240   HCT 28.0 (L) 07/21/2022 0240   PLT 299 07/21/2022 0240   MCV 85.6 07/21/2022 0240   MCH 29.4 07/21/2022 0240   MCHC 34.3 07/21/2022 0240   RDW 12.4 07/21/2022 0240   LYMPHSABS 2.8 07/21/2022 0240   MONOABS  1.5 (H) 07/21/2022 0240   EOSABS 0.1 07/21/2022 0240   BASOSABS 0.1 07/21/2022 0240    BMET    Component Value Date/Time   NA 135 07/20/2022 1058   NA 142 11/17/2021 1732   K 3.4 (L) 07/20/2022 1058   CL 96 (L) 07/20/2022 1058   CO2 30 07/20/2022 1058   GLUCOSE 152 (H) 07/20/2022 1058   BUN 23 (H) 07/20/2022 1058   BUN 37 (H) 11/17/2021 1732   CREATININE 0.97 07/20/2022 1235   CREATININE 0.78 07/10/2020 0910   CALCIUM 8.4 (L) 07/20/2022 1058   GFRNONAA >60 07/20/2022 1235   GFRNONAA 106 07/10/2020 0910   GFRAA 123 07/10/2020 0910    COAGS: Lab Results  Component Value Date   INR 1.1 05/30/2020   INR 0.9 01/10/2008     Non-Invasive Vascular Imaging:   ABIs pending   ASSESSMENT/PLAN: This is a 51 y.o. male with a necrotic R 2nd toe   -The patient has palpable right DP and PT pulses -He has intact motor and sensation of his foot and toes -He has a necrotic right second toe with underlying infection.  X-ray of right foot demonstrates marked soft tissue swelling at the second toe with evidence of osteomyelitis at the base of the distal phalanx -He is on broad-spectrum antibiotics including vancomycin and ampicillin-sulbactam -He will need amputation of right second toe and possibly right great toe sometime this week -Dr. Donzetta Matters to evaluate patient and determine further plan   Vicente Serene,  PA-C [facility] [phone]   I have independently interviewed and examined patient and agree with PA assessment and plan above.  Foul-smelling right second toe with underlying infection also wound at the base of the right first toe.  Plan will be for amputation tomorrow in the operating room with one of my partners.  I discussed with the patient he demonstrates good understanding and will be n.p.o. past midnight.  Amaal Dimartino C. Donzetta Matters, MD [facility] [place] Office: [phone] Pager: [phone]

## 2022-07-21 NOTE — Progress Notes (Signed)
Notified bedside nurse of need to draw lactic acid and blood cultures.  

## 2022-07-21 NOTE — Progress Notes (Signed)
Code sepsis done yesterday. MD reordered it but needs no blood cultures, no lactic acid, no  fluid bolus and antibiotics started yesterday

## 2022-07-21 NOTE — Progress Notes (Signed)
   07/21/22 1805  Assess: MEWS Score  Temp (!) 103 F (39.4 C)  BP (!) 153/74  MAP (mmHg) 98  Pulse Rate 87  Resp 20  Level of Consciousness Alert  SpO2 98 %  O2 Device Room Air  Assess: MEWS Score  MEWS Temp 2  MEWS Systolic 0  MEWS Pulse 0  MEWS RR 0  MEWS LOC 0  MEWS Score 2  MEWS Score Color Yellow  Assess: if the MEWS score is Yellow or Red  Were vital signs taken at a resting state? Yes  Focused Assessment No change from prior assessment  Does the patient meet 2 or more of the SIRS criteria? No  Does the patient have a confirmed or suspected source of infection? Yes  Provider and Rapid Response Notified? Yes (Provider notified, rapid response not notified)  MEWS guidelines implemented *See Row Information* Yes  Treat  MEWS Interventions Administered prn meds/treatments  Pain Scale 0-10  Pain Score 4  Pain Type Acute pain  Pain Location Head  Pain Orientation Right;Left  Pain Radiating Towards not applicable  Pain Descriptors / Indicators Aching;Throbbing  Pain Frequency Intermittent  Pain Onset On-going  Patients Stated Pain Goal 0  Pain Intervention(s) Other (Comment);MD notified (Comment);Repositioned;Cold applied (iced wash cloths given)  Multiple Pain Sites No  Patients response to intervention Decreased  Take Vital Signs  Increase Vital Sign Frequency  Yellow: Q 2hr X 2 then Q 4hr X 2, if remains yellow, continue Q 4hrs  Escalate  MEWS: Escalate Yellow: discuss with charge nurse/RN and consider discussing with provider and RRT  Notify: Charge Nurse/RN  Name of Charge Nurse/RN Notified Visual merchandiser  Date Charge Nurse/RN Notified 07/21/22  Time Charge Nurse/RN Notified 1837  Notify: Provider  Provider Name/Title Dr.Amin  Date Provider Notified 07/21/22  Time Provider Notified 1610  Method of Notification Call  Notification Reason Other (Comment) (temperature elevation)  Provider response See new orders  Date of Provider Response 07/21/22  Time  of Provider Response 1833  Notify: Rapid Response  Name of Rapid Response RN Notified Not applicable  Document  Patient Outcome Other (Comment) (Patient is currently stable.  No change in status except for a the elevation in temperature which is higher from previous temp elevations.  No distress.  Needs addressed.)  Progress note created (see row info) Yes  Assess: SIRS CRITERIA  SIRS Temperature  1  SIRS Pulse 0  SIRS Respirations  0  SIRS WBC 1  SIRS Score Sum  2

## 2022-07-21 NOTE — Progress Notes (Signed)
Made MD aware that lactic acid cannot be an add on to am blood collection because it has to be on ice.

## 2022-07-21 NOTE — Progress Notes (Signed)
Notified provider of need to order lactic acid and blood cultures.  

## 2022-07-21 NOTE — Progress Notes (Signed)
ABI's have been completed. Preliminary results can be found in CV Proc through chart review.   07/21/22 12:09 PM Sean Carter RVT

## 2022-07-21 NOTE — Progress Notes (Signed)
Elink following code sepsis °

## 2022-07-21 NOTE — Progress Notes (Signed)
PROGRESS NOTE    Sean Carter  VOJ:[id number] DOB: 02-04-1971 DOA: 07/20/2022 PCP: Levin Erp, MD   Brief Narrative:  51 year old gentleman with significant peripheral vascular disease, history of gangrene of left great toe, hypertension, diabetes mellitus, status post amputation of left great toe presented to the emergency department complaining of right second toe swelling redness and blackening since 3 days ago. Patient also has a nonproductive cough and has had fever at home up to 103.Patient has gangrene in the left great toe and vascular surgery was consulted Dr. Randie Heinz requested patient be admitted to [facility] for a formal vascular surgery consultation.  Patient has been started on broad-spectrum IV antibiotics after blood cultures obtained.     Assessment & Plan:  Principal Problem:   Necrotic toes ([name]) Active Problems:   Hypertension associated with diabetes ([name])   Hypokalemia   Diabetes mellitus type 2 with peripheral artery disease ([name])   Mixed hyperlipidemia   Diabetic retinopathy ([name])   Gangrene of toe ([name])   Leukocytosis     Assessment and Plan: * Necrotic toes ([name]) Sepsis due to Gangrene of the right second toe and osteomyelitis -Sepsis evidenced by fever, leukocytosis and source.  Improving.  Osteomyelitis seen on x-ray Formal vascular surgery consultation requested with Dr. Randie Heinz.  On vancomycin and Unasyn.  Likely will need an amputation  Hypertension associated with diabetes ([name]) - On losartan, HCTZ.  IV as needed.  Diabetic retinopathy ([name]) - Outpatient follow-up with ophthalmology  Mixed hyperlipidemia - Lipitor  Diabetes mellitus type 2 with peripheral artery disease ([name]) - A1c- - Sliding scale and Accu-Chek.  Semglee 24 units daily  Hypokalemia - As needed repletion    DVT prophylaxis: Sup q. heparin Code Status: Full code Family Communication:    Inpatient stay for IV antibiotics and likely amputation  Subjective: And  examined at bedside, no new complaints.  Tells me this injury happened while walking out of [employer] and initially thought to be healing but started having foul smell and necrosis about 3-4 days ago with fevers.   Examination:  General exam: Appears calm and comfortable  Respiratory system: Clear to auscultation. Respiratory effort normal. Cardiovascular system: S1 & S2 heard, RRR. No JVD, murmurs, rubs, gallops or clicks. No pedal edema. Gastrointestinal system: Abdomen is nondistended, soft and nontender. No organomegaly or masses felt. Normal bowel sounds heard. Central nervous system: Alert and oriented. No focal neurological deficits. Extremities: Symmetric 5 x 5 power. Skin: No chronic appearing right second toe Psychiatry: Judgement and insight appear normal. Mood & affect appropriate.         Objective: Vitals:   07/21/22 0307 07/21/22 0500 07/21/22 0600 07/21/22 0750  BP: (!) 155/77   (!) 157/76  Pulse: 89   86  Resp: 18   18  Temp: (!) 100.5 F (38.1 C) 100.2 F (37.9 C) 97.8 F (36.6 C) 99.7 F (37.6 C)  TempSrc: Oral   Oral  SpO2: 92%   98%  Weight:      Height:        Intake/Output Summary (Last 24 hours) at 07/21/2022 0850 Last data filed at 07/21/2022 0301 Gross per 24 hour  Intake 1371.65 ml  Output --  Net 1371.65 ml   Filed Weights   07/20/22 0741  Weight: 93.7 kg     Data Reviewed:   CBC: Recent Labs  Lab 07/20/22 1058 07/20/22 1235 07/21/22 0240  WBC 15.1* 14.1* 16.2*  NEUTROABS 11.8*  --  11.7*  HGB 10.9* 10.4*  9.6*  HCT 32.6* 30.9* 28.0*  MCV 86.9 86.6 85.6  PLT 351 344 299   Basic Metabolic Panel: Recent Labs  Lab 07/20/22 1058 07/20/22 1235  NA 135  --   K 3.4*  --   CL 96*  --   CO2 30  --   GLUCOSE 152*  --   BUN 23*  --   CREATININE 1.00 0.97  CALCIUM 8.4*  --    GFR: Estimated Creatinine Clearance: 100 mL/min (by C-G formula based on SCr of 0.97 mg/dL). Liver Function Tests: No results for input(s): "AST",  "ALT", "ALKPHOS", "BILITOT", "PROT", "ALBUMIN" in the last 168 hours. No results for input(s): "LIPASE", "AMYLASE" in the last 168 hours. No results for input(s): "AMMONIA" in the last 168 hours. Coagulation Profile: No results for input(s): "INR", "PROTIME" in the last 168 hours. Cardiac Enzymes: No results for input(s): "CKTOTAL", "CKMB", "CKMBINDEX", "TROPONINI" in the last 168 hours. BNP (last 3 results) No results for input(s): "PROBNP" in the last 8760 hours. HbA1C: No results for input(s): "HGBA1C" in the last 72 hours. CBG: Recent Labs  Lab 07/20/22 1014 07/20/22 1732 07/20/22 2238 07/21/22 0306 07/21/22 0745  GLUCAP 130* 115* 192* 139* 144*   Lipid Profile: No results for input(s): "CHOL", "HDL", "LDLCALC", "TRIG", "CHOLHDL", "LDLDIRECT" in the last 72 hours. Thyroid Function Tests: No results for input(s): "TSH", "T4TOTAL", "FREET4", "T3FREE", "THYROIDAB" in the last 72 hours. Anemia Panel: No results for input(s): "VITAMINB12", "FOLATE", "FERRITIN", "TIBC", "IRON", "RETICCTPCT" in the last 72 hours. Sepsis Labs: Recent Labs  Lab 07/20/22 1058 07/20/22 1235  LATICACIDVEN 1.7 1.2    Recent Results (from the past 240 hour(s))  Blood culture (routine x 2)     Status: None (Preliminary result)   Collection Time: 07/20/22 10:58 AM   Specimen: BLOOD  Result Value Ref Range Status   Specimen Description BLOOD LEFT ASSIST CONTROL  Final   Special Requests   Final    BOTTLES DRAWN AEROBIC AND ANAEROBIC Blood Culture results may not be optimal due to an excessive volume of blood received in culture bottles   Culture   Final    NO GROWTH < 24 HOURS Performed at [facility], [address] [place], [place] [address]    Report Status PENDING  Incomplete  Blood culture (routine x 2)     Status: None (Preliminary result)   Collection Time: 07/20/22 10:58 AM   Specimen: BLOOD  Result Value Ref Range Status   Specimen Description BLOOD RIGHT ASSIST CONTROL  Final    Special Requests   Final    BOTTLES DRAWN AEROBIC AND ANAEROBIC Blood Culture adequate volume   Culture   Final    NO GROWTH < 24 HOURS Performed at [facility], [address], [place], [place] [address]    Report Status PENDING  Incomplete         Radiology Studies: DG Chest Portable 1 View  Result Date: 07/20/2022 CLINICAL DATA:  Cough since Friday EXAM: PORTABLE CHEST 1 VIEW COMPARISON:  Portable exam 0932 hours compared to 06/04/2020 FINDINGS: Upper normal heart size. Mediastinal contours and pulmonary vascularity normal. Minimal RIGHT basilar atelectasis. Lungs otherwise clear. No pulmonary infiltrate, pleural effusion, or pneumothorax. Osseous structures unremarkable. IMPRESSION: Minimal RIGHT basilar atelectasis. Electronically Signed   By: Ulyses Southward M.D.   On: 07/20/2022 10:01   DG Foot Complete Right  Result Date: 07/20/2022 CLINICAL DATA:  RIGHT second toe injury, necrotic, diabetes mellitus EXAM: RIGHT FOOT COMPLETE - 3+ VIEW COMPARISON:  None Available. FINDINGS: Dressing artifacts and marked soft tissue swelling at second toe. Osseous demineralization. Joint spaces preserved. Unable to exclude foci of soft tissue gas at second toe due to dressing artifacts. Bone destruction at lateral aspect base of proximal phalanx second toe consistent with osteomyelitis. No acute fracture, dislocation, or additional bone destruction. Plantar calcaneal spur. Additional spur formation at dorsal margin of talonavicular joint. IMPRESSION: Extensive soft tissue swelling and dressing artifacts at RIGHT second toe with evidence of osteomyelitis at the base of the distal phalanx. Electronically Signed   By: Lavonia Dana M.D.   On: 07/20/2022 10:00        Scheduled Meds:  atorvastatin  40 mg Oral QPM   fluticasone  2 spray Each Nare Daily   heparin  5,000 Units Subcutaneous Q8H   hydrochlorothiazide  25 mg Oral Daily   insulin aspart  0-15 Units Subcutaneous TID WC   insulin aspart  8 Units  Subcutaneous TID WC   insulin glargine-yfgn  24 Units Subcutaneous Daily   losartan  100 mg Oral Daily   multivitamin with minerals  1 tablet Oral Daily   Continuous Infusions:  ampicillin-sulbactam (UNASYN) IV 3 g (07/21/22 0756)   lactated ringers 55 mL/hr at 07/21/22 0505   vancomycin 1,250 mg (07/21/22 0224)     LOS: 1 day   Time spent= 35 mins    Skyler Dusing Arsenio Loader, MD [facility]  If 7PM-7AM, please contact night-coverage  07/21/2022, 8:50 AM

## 2022-07-22 ENCOUNTER — Encounter (HOSPITAL_COMMUNITY): Payer: Self-pay | Admitting: Family Medicine

## 2022-07-22 ENCOUNTER — Other Ambulatory Visit: Payer: Self-pay

## 2022-07-22 ENCOUNTER — Encounter (HOSPITAL_COMMUNITY)
Admission: EM | Disposition: A | Discharge status: Home / Self Care | Payer: Self-pay | Source: Home / Self Care | Attending: Internal Medicine

## 2022-07-22 ENCOUNTER — Inpatient Hospital Stay (HOSPITAL_COMMUNITY): Payer: 59

## 2022-07-22 DIAGNOSIS — I96 Gangrene, not elsewhere classified: Secondary | ICD-10-CM | POA: Diagnosis not present

## 2022-07-22 DIAGNOSIS — D649 Anemia, unspecified: Secondary | ICD-10-CM

## 2022-07-22 DIAGNOSIS — E1152 Type 2 diabetes mellitus with diabetic peripheral angiopathy with gangrene: Secondary | ICD-10-CM

## 2022-07-22 DIAGNOSIS — Z794 Long term (current) use of insulin: Secondary | ICD-10-CM

## 2022-07-22 DIAGNOSIS — E11621 Type 2 diabetes mellitus with foot ulcer: Secondary | ICD-10-CM | POA: Diagnosis not present

## 2022-07-22 DIAGNOSIS — I10 Essential (primary) hypertension: Secondary | ICD-10-CM

## 2022-07-22 HISTORY — PX: AMPUTATION: SHX166

## 2022-07-22 LAB — CBC WITH DIFFERENTIAL/PLATELET
Abs Immature Granulocytes: 0.12 10*3/uL — ABNORMAL HIGH (ref 0.00–0.07)
Basophils Absolute: 0.1 10*3/uL (ref 0.0–0.1)
Basophils Relative: 0 %
Eosinophils Absolute: 0.3 10*3/uL (ref 0.0–0.5)
Eosinophils Relative: 2 %
HCT: 26.6 % — ABNORMAL LOW (ref 39.0–52.0)
Hemoglobin: 9.1 g/dL — ABNORMAL LOW (ref 13.0–17.0)
Immature Granulocytes: 1 %
Lymphocytes Relative: 17 %
Lymphs Abs: 2.3 10*3/uL (ref 0.7–4.0)
MCH: 29.9 pg (ref 26.0–34.0)
MCHC: 34.2 g/dL (ref 30.0–36.0)
MCV: 87.5 fL (ref 80.0–100.0)
Monocytes Absolute: 1.2 10*3/uL — ABNORMAL HIGH (ref 0.1–1.0)
Monocytes Relative: 9 %
Neutro Abs: 9.6 10*3/uL — ABNORMAL HIGH (ref 1.7–7.7)
Neutrophils Relative %: 71 %
Platelets: 295 10*3/uL (ref 150–400)
RBC: 3.04 MIL/uL — ABNORMAL LOW (ref 4.22–5.81)
RDW: 12.4 % (ref 11.5–15.5)
WBC: 13.6 10*3/uL — ABNORMAL HIGH (ref 4.0–10.5)
nRBC: 0 % (ref 0.0–0.2)

## 2022-07-22 LAB — COMPREHENSIVE METABOLIC PANEL
ALT: 50 U/L — ABNORMAL HIGH (ref 0–44)
AST: 47 U/L — ABNORMAL HIGH (ref 15–41)
Albumin: 2.4 g/dL — ABNORMAL LOW (ref 3.5–5.0)
Alkaline Phosphatase: 114 U/L (ref 38–126)
Anion gap: 14 (ref 5–15)
BUN: 21 mg/dL — ABNORMAL HIGH (ref 6–20)
CO2: 29 mmol/L (ref 22–32)
Calcium: 8.7 mg/dL — ABNORMAL LOW (ref 8.9–10.3)
Chloride: 96 mmol/L — ABNORMAL LOW (ref 98–111)
Creatinine, Ser: 1.01 mg/dL (ref 0.61–1.24)
GFR, Estimated: >60 mL/min (ref >60)
Glucose, Bld: 150 mg/dL — ABNORMAL HIGH (ref 70–99)
Potassium: 3.2 mmol/L — ABNORMAL LOW (ref 3.5–5.1)
Sodium: 139 mmol/L (ref 135–145)
Total Bilirubin: 0.7 mg/dL (ref 0.3–1.2)
Total Protein: 6.2 g/dL — ABNORMAL LOW (ref 6.5–8.1)

## 2022-07-22 LAB — SURGICAL PCR SCREEN
MRSA, PCR: NEGATIVE
Staphylococcus aureus: POSITIVE — AB

## 2022-07-22 LAB — GLUCOSE, CAPILLARY
Glucose-Capillary: 147 mg/dL — ABNORMAL HIGH (ref 70–99)
Glucose-Capillary: 135 mg/dL — ABNORMAL HIGH (ref 70–99)
Glucose-Capillary: 128 mg/dL — ABNORMAL HIGH (ref 70–99)
Glucose-Capillary: 135 mg/dL — ABNORMAL HIGH (ref 70–99)
Glucose-Capillary: 117 mg/dL — ABNORMAL HIGH (ref 70–99)
Glucose-Capillary: 315 mg/dL — ABNORMAL HIGH (ref 70–99)

## 2022-07-22 LAB — MAGNESIUM: Magnesium: 2 mg/dL (ref 1.7–2.4)

## 2022-07-22 SURGERY — AMPUTATION DIGIT
Anesthesia: General | Laterality: Right

## 2022-07-22 MED ORDER — FENTANYL CITRATE (PF) 250 MCG/5ML IJ SOLN
INTRAMUSCULAR | Status: AC
  Filled 2022-07-22: qty 5

## 2022-07-22 MED ORDER — PROPOFOL 10 MG/ML IV BOLUS
INTRAVENOUS | Status: AC
  Filled 2022-07-22: qty 20

## 2022-07-22 MED ORDER — ACETAMINOPHEN 500 MG PO TABS
1000.0000 mg | ORAL_TABLET | Freq: Once | ORAL | Status: AC
  Administered 2022-07-22: 1000 mg via ORAL
  Filled 2022-07-22: qty 2

## 2022-07-22 MED ORDER — FENTANYL CITRATE (PF) 250 MCG/5ML IJ SOLN
INTRAMUSCULAR | Status: DC | PRN
  Administered 2022-07-22 (×2): 25 ug via INTRAVENOUS

## 2022-07-22 MED ORDER — PHENYLEPHRINE 80 MCG/ML (10ML) SYRINGE FOR IV PUSH (FOR BLOOD PRESSURE SUPPORT)
PREFILLED_SYRINGE | INTRAVENOUS | Status: DC | PRN
  Administered 2022-07-22 (×2): 80 ug via INTRAVENOUS
  Administered 2022-07-22: 160 ug via INTRAVENOUS

## 2022-07-22 MED ORDER — MUPIROCIN 2 % EX OINT
1.0000 | TOPICAL_OINTMENT | Freq: Two times a day (BID) | CUTANEOUS | Status: DC
  Administered 2022-07-22 – 2022-07-24 (×5): 1 via NASAL
  Filled 2022-07-22 (×2): qty 22

## 2022-07-22 MED ORDER — MIDAZOLAM HCL 2 MG/2ML IJ SOLN
INTRAMUSCULAR | Status: AC
  Filled 2022-07-22: qty 2

## 2022-07-22 MED ORDER — 0.9 % SODIUM CHLORIDE (POUR BTL) OPTIME
TOPICAL | Status: DC | PRN
  Administered 2022-07-22: 1000 mL

## 2022-07-22 MED ORDER — FENTANYL CITRATE (PF) 100 MCG/2ML IJ SOLN
INTRAMUSCULAR | Status: AC
  Filled 2022-07-22: qty 2

## 2022-07-22 MED ORDER — DEXAMETHASONE SODIUM PHOSPHATE 10 MG/ML IJ SOLN
INTRAMUSCULAR | Status: DC | PRN
  Administered 2022-07-22: 5 mg via INTRAVENOUS

## 2022-07-22 MED ORDER — ONDANSETRON HCL 4 MG/2ML IJ SOLN
INTRAMUSCULAR | Status: AC
  Filled 2022-07-22: qty 4

## 2022-07-22 MED ORDER — FENTANYL CITRATE (PF) 100 MCG/2ML IJ SOLN
25.0000 ug | INTRAMUSCULAR | Status: DC | PRN
  Administered 2022-07-22 (×2): 25 ug via INTRAVENOUS

## 2022-07-22 MED ORDER — POTASSIUM CHLORIDE 10 MEQ/100ML IV SOLN
10.0000 meq | INTRAVENOUS | Status: AC
  Administered 2022-07-22 (×4): 10 meq via INTRAVENOUS
  Filled 2022-07-22 (×4): qty 100

## 2022-07-22 MED ORDER — PROPOFOL 10 MG/ML IV BOLUS
INTRAVENOUS | Status: DC | PRN
  Administered 2022-07-22: 200 mg via INTRAVENOUS
  Administered 2022-07-22: 100 mg via INTRAVENOUS

## 2022-07-22 MED ORDER — CHLORHEXIDINE GLUCONATE 0.12 % MT SOLN
15.0000 mL | Freq: Once | OROMUCOSAL | Status: AC
  Administered 2022-07-22: 15 mL via OROMUCOSAL

## 2022-07-22 MED ORDER — RACEPINEPHRINE HCL 2.25 % IN NEBU
0.2500 mL | INHALATION_SOLUTION | Freq: Once | RESPIRATORY_TRACT | Status: AC
  Administered 2022-07-22: 0.25 mL via RESPIRATORY_TRACT

## 2022-07-22 MED ORDER — SUCCINYLCHOLINE CHLORIDE 200 MG/10ML IV SOSY
PREFILLED_SYRINGE | INTRAVENOUS | Status: DC | PRN
  Administered 2022-07-22: 100 mg via INTRAVENOUS

## 2022-07-22 MED ORDER — EPINEPHRINE PF 1 MG/ML IJ SOLN
INTRAMUSCULAR | Status: AC
  Filled 2022-07-22: qty 1

## 2022-07-22 MED ORDER — LIDOCAINE 2% (20 MG/ML) 5 ML SYRINGE
INTRAMUSCULAR | Status: DC | PRN
  Administered 2022-07-22: 100 mg via INTRAVENOUS

## 2022-07-22 MED ORDER — MIDAZOLAM HCL 2 MG/2ML IJ SOLN
INTRAMUSCULAR | Status: DC | PRN
  Administered 2022-07-22: 2 mg via INTRAVENOUS

## 2022-07-22 MED ORDER — CHLORHEXIDINE GLUCONATE CLOTH 2 % EX PADS
6.0000 | MEDICATED_PAD | Freq: Every day | CUTANEOUS | Status: DC
  Administered 2022-07-22 – 2022-07-24 (×3): 6 via TOPICAL

## 2022-07-22 MED ORDER — BUPIVACAINE HCL (PF) 0.25 % IJ SOLN
INTRAMUSCULAR | Status: AC
  Filled 2022-07-22: qty 30

## 2022-07-22 MED ORDER — LACTATED RINGERS IV SOLN: INTRAVENOUS | Status: DC

## 2022-07-22 MED ORDER — ONDANSETRON HCL 4 MG/2ML IJ SOLN
INTRAMUSCULAR | Status: DC | PRN
  Administered 2022-07-22: 4 mg via INTRAVENOUS

## 2022-07-22 MED ORDER — RACEPINEPHRINE HCL 2.25 % IN NEBU
INHALATION_SOLUTION | RESPIRATORY_TRACT | Status: AC
  Filled 2022-07-22: qty 0.5

## 2022-07-22 MED ORDER — PHENYLEPHRINE 80 MCG/ML (10ML) SYRINGE FOR IV PUSH (FOR BLOOD PRESSURE SUPPORT)
PREFILLED_SYRINGE | INTRAVENOUS | Status: AC
  Filled 2022-07-22: qty 10

## 2022-07-22 MED ORDER — INSULIN ASPART 100 UNIT/ML IJ SOLN: 0.0000 [IU] | INTRAMUSCULAR | Status: DC | PRN

## 2022-07-22 MED ORDER — DEXAMETHASONE SODIUM PHOSPHATE 10 MG/ML IJ SOLN
INTRAMUSCULAR | Status: AC
  Filled 2022-07-22: qty 1

## 2022-07-22 MED ORDER — SUCCINYLCHOLINE CHLORIDE 200 MG/10ML IV SOSY
PREFILLED_SYRINGE | INTRAVENOUS | Status: AC
  Filled 2022-07-22: qty 10

## 2022-07-22 MED ORDER — ORAL CARE MOUTH RINSE: 15.0000 mL | Freq: Once | OROMUCOSAL | Status: AC

## 2022-07-22 MED ORDER — PROMETHAZINE HCL 25 MG/ML IJ SOLN: 6.2500 mg | INTRAMUSCULAR | Status: DC | PRN

## 2022-07-22 SURGICAL SUPPLY — 39 items
BAG COUNTER SPONGE SURGICOUNT (BAG) ×2 IMPLANT
BAG SPNG CNTER NS LX DISP (BAG) ×1
BLADE AVERAGE 25X9 (BLADE) IMPLANT
BLADE SURG 21 STRL SS (BLADE) ×2 IMPLANT
BNDG CMPR 5X4 CHSV STRCH STRL (GAUZE/BANDAGES/DRESSINGS) ×1
BNDG COHESIVE 4X5 TAN STRL LF (GAUZE/BANDAGES/DRESSINGS) IMPLANT
BNDG CONFORM 3 STRL LF (GAUZE/BANDAGES/DRESSINGS) ×2 IMPLANT
BNDG ELASTIC 4X5.8 VLCR STR LF (GAUZE/BANDAGES/DRESSINGS) ×2 IMPLANT
BNDG GAUZE DERMACEA FLUFF 4 (GAUZE/BANDAGES/DRESSINGS) ×2 IMPLANT
BNDG GZE DERMACEA 4 6PLY (GAUZE/BANDAGES/DRESSINGS) ×1
CANISTER SUCT 3000ML PPV (MISCELLANEOUS) ×2 IMPLANT
COVER SURGICAL LIGHT HANDLE (MISCELLANEOUS) ×2 IMPLANT
DRAPE EXTREMITY T 121X128X90 (DISPOSABLE) IMPLANT
DRAPE HALF SHEET 40X57 (DRAPES) ×2 IMPLANT
DRAPE ORTHO SPLIT 77X108 STRL (DRAPES) ×2
DRAPE SURG ORHT 6 SPLT 77X108 (DRAPES) ×4 IMPLANT
ELECT REM PT RETURN 9FT ADLT (ELECTROSURGICAL) ×1
ELECTRODE REM PT RTRN 9FT ADLT (ELECTROSURGICAL) ×2 IMPLANT
GAUZE SPONGE 4X4 12PLY STRL (GAUZE/BANDAGES/DRESSINGS) ×2 IMPLANT
GAUZE XEROFORM 1X8 LF (GAUZE/BANDAGES/DRESSINGS) ×2 IMPLANT
GLOVE BIO SURGEON STRL SZ8 (GLOVE) ×2 IMPLANT
GOWN STRL REUS W/ TWL LRG LVL3 (GOWN DISPOSABLE) ×4 IMPLANT
GOWN STRL REUS W/ TWL XL LVL3 (GOWN DISPOSABLE) ×2 IMPLANT
GOWN STRL REUS W/TWL LRG LVL3 (GOWN DISPOSABLE) ×2
GOWN STRL REUS W/TWL XL LVL3 (GOWN DISPOSABLE) ×1
KIT BASIN OR (CUSTOM PROCEDURE TRAY) ×2 IMPLANT
KIT TURNOVER KIT B (KITS) ×2 IMPLANT
NS IRRIG 1000ML POUR BTL (IV SOLUTION) ×2 IMPLANT
PACK GENERAL/GYN (CUSTOM PROCEDURE TRAY) ×2 IMPLANT
PAD ARMBOARD 7.5X6 YLW CONV (MISCELLANEOUS) ×4 IMPLANT
STAPLER VISISTAT 35W (STAPLE) IMPLANT
SUT ETHILON 2 0 PSLX (SUTURE) ×2 IMPLANT
SUT ETHILON 3 0 PS 1 (SUTURE) IMPLANT
SWAB COLLECTION DEVICE MRSA (MISCELLANEOUS) IMPLANT
SWAB CULTURE ESWAB REG 1ML (MISCELLANEOUS) IMPLANT
TOWEL GREEN STERILE (TOWEL DISPOSABLE) ×4 IMPLANT
TOWEL GREEN STERILE FF (TOWEL DISPOSABLE) ×2 IMPLANT
UNDERPAD 30X36 HEAVY ABSORB (UNDERPADS AND DIAPERS) ×2 IMPLANT
WATER STERILE IRR 1000ML POUR (IV SOLUTION) ×2 IMPLANT

## 2022-07-22 NOTE — Anesthesia Preprocedure Evaluation (Signed)
Anesthesia Evaluation  Patient identified by MRN, date of birth, ID band Patient awake    Reviewed: Allergy & Precautions, NPO status , Patient's Chart, lab work & pertinent test results  Airway Mallampati: II  TM Distance: >3 FB Neck ROM: Full    Dental  (+) Teeth Intact, Dental Advisory Given   Pulmonary neg pulmonary ROS   Pulmonary exam normal breath sounds clear to auscultation       Cardiovascular hypertension, Pt. on medications + Peripheral Vascular Disease (Peripheral Vascular Disease with gangrene of Right 1st and 2nd Toes)  Normal cardiovascular exam Rhythm:Regular Rate:Normal     Neuro/Psych negative neurological ROS  negative psych ROS   GI/Hepatic negative GI ROS, Neg liver ROS,,,  Endo/Other  diabetes, Type 2, Insulin Dependent  Obesity   Renal/GU negative Renal ROS     Musculoskeletal negative musculoskeletal ROS (+)    Abdominal   Peds  Hematology  (+) Blood dyscrasia, anemia   Anesthesia Other Findings Day of surgery medications reviewed with the patient.  Reproductive/Obstetrics                             Anesthesia Physical Anesthesia Plan  ASA: 3  Anesthesia Plan: General   Post-op Pain Management: Tylenol PO (pre-op)*   Induction: Intravenous  PONV Risk Score and Plan: 2 and Midazolam, Dexamethasone and Ondansetron  Airway Management Planned: LMA  Additional Equipment:   Intra-op Plan:   Post-operative Plan: Extubation in OR  Informed Consent: I have reviewed the patients History and Physical, chart, labs and discussed the procedure including the risks, benefits and alternatives for the proposed anesthesia with the patient or authorized representative who has indicated his/her understanding and acceptance.     Dental advisory given  Plan Discussed with: CRNA  Anesthesia Plan Comments:        Anesthesia Quick Evaluation

## 2022-07-22 NOTE — Anesthesia Procedure Notes (Addendum)
Procedure Name: Intubation Date/Time: 07/22/2022 3:58 PM  Performed by: Santa Lighter, MDPre-anesthesia Checklist: Patient identified, Emergency Drugs available, Suction available and Patient being monitored Patient Re-evaluated:Patient Re-evaluated prior to induction Oxygen Delivery Method: Circle System Utilized Preoxygenation: Pre-oxygenation with 100% oxygen Induction Type: IV induction Ventilation: Mask ventilation without difficulty and Oral airway inserted - appropriate to patient size Laryngoscope Size: Mac and 4 Grade View: Grade I Tube type: Oral Tube size: 7.5 mm Number of attempts: 1 Airway Equipment and Method: Stylet and Oral airway Placement Confirmation: ETT inserted through vocal cords under direct vision, positive ETCO2 and breath sounds checked- equal and bilateral Secured at: 22 cm Tube secured with: Tape Dental Injury: Teeth and Oropharynx as per pre-operative assessment

## 2022-07-22 NOTE — Evaluation (Signed)
Occupational Therapy Evaluation and Discharge Patient Details Name: Sean Carter MRN: [id number] DOB: 08/16/1971 Today's Date: 07/22/2022   History of Present Illness 51 y/o male peresented to ED on 07/20/22 for R second toe swelling and necrosis since 3 days ago. Admitted for sepsis and awaiting R second toe and great toe amputation. PMH: PAD, T2DM, HTN, s/p L great toe amputation   Clinical Impression   Pt is functioning independently in ADLs and mobility. He is familiar with use of RW, heel weight bearing and compensatory strategies for ADLs from his prior L great toe amputation. Pt likely to be modified independent following upcoming R foot surgery. He has support of his wife at home as needed. No further OT needs.     Recommendations for follow up therapy are one component of a multi-disciplinary discharge planning process, led by the attending physician.  Recommendations may be updated based on patient status, additional functional criteria and insurance authorization.   Follow Up Recommendations  No OT follow up    Assistance Recommended at Discharge PRN  Patient can return home with the following Assist for transportation    Functional Status Assessment  Patient has had a recent decline in their functional status and demonstrates the ability to make significant improvements in function in a reasonable and predictable amount of time.  Equipment Recommendations  None recommended by OT    Recommendations for Other Services       Precautions / Restrictions Precautions Precautions: None Restrictions Weight Bearing Restrictions: No      Mobility Bed Mobility Overal bed mobility: Independent                  Transfers Overall transfer level: Independent Equipment used: Rolling walker (2 wheels)               General transfer comment: practiced use of RW and weight on R heel in preparation for upcoming surgery      Balance Overall balance assessment:  Modified Independent                                         ADL either performed or assessed with clinical judgement   ADL Overall ADL's : Independent                                       General ADL Comments: pt plans to sponge bathe at sink vs covering his foot and standing to shower     Vision Baseline Vision/History: 1 Wears glasses Ability to See in Adequate Light: 0 Adequate Patient Visual Report: No change from baseline       Perception     Praxis      Pertinent Vitals/Pain Pain Assessment Pain Assessment: No/denies pain     Hand Dominance Right   Extremity/Trunk Assessment Upper Extremity Assessment Upper Extremity Assessment: Overall WFL for tasks assessed   Lower Extremity Assessment Lower Extremity Assessment: Defer to PT evaluation   Cervical / Trunk Assessment Cervical / Trunk Assessment: Normal   Communication Communication Communication: No difficulties   Cognition Arousal/Alertness: Awake/alert Behavior During Therapy: WFL for tasks assessed/performed  General Comments       Exercises     Shoulder Instructions      Home Living Family/patient expects to be discharged to:: Private residence Living Arrangements: Spouse/significant other Available Help at Discharge: Family;Available 24 hours/day Type of Home: House Home Access: Stairs to enter [employer] of Steps: 1 plus 1   Home Layout: One level     Bathroom Shower/Tub: Chief Strategy Officer: Standard     Home Equipment: None          Prior Functioning/Environment Prior Level of Function : Independent/Modified Independent;Driving;Working/employed               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, operational        OT Problem List:        OT Treatment/Interventions:      OT Goals(Current goals can be found in the care plan section)    OT Frequency:       Co-evaluation              AM-PAC OT "6 Clicks" Daily Activity     Outcome Measure Help from another person eating meals?: None Help from another person taking care of personal grooming?: None Help from another person toileting, which includes using toliet, bedpan, or urinal?: None Help from another person bathing (including washing, rinsing, drying)?: None Help from another person to put on and taking off regular upper body clothing?: None Help from another person to put on and taking off regular lower body clothing?: None 6 Click Score: 24   End of Session Equipment Utilized During Treatment: Rolling walker (2 wheels)  Activity Tolerance: Patient tolerated treatment well Patient left: in bed;with call bell/phone within reach  OT Visit Diagnosis: Other abnormalities of gait and mobility (R26.89)                Time: 5284-1324 OT Time Calculation (min): 19 min Charges:  OT General Charges $OT Visit: 1 Visit OT Evaluation $OT Eval Low Complexity: 1 Low Berna Spare, OTR/L Acute Rehabilitation Services Office: [phone]   Evern Bio 07/22/2022, 10:21 AM

## 2022-07-22 NOTE — Anesthesia Procedure Notes (Signed)
Procedure Name: LMA Insertion Date/Time: 07/22/2022 3:37 PM  Performed by: Dorann Lodge, CRNAPre-anesthesia Checklist: Patient identified, Emergency Drugs available, Suction available and Patient being monitored Patient Re-evaluated:Patient Re-evaluated prior to induction Oxygen Delivery Method: Circle System Utilized Preoxygenation: Pre-oxygenation with 100% oxygen Induction Type: IV induction Ventilation: Mask ventilation without difficulty LMA: LMA inserted LMA Size: 4.0 Number of attempts: 1 Airway Equipment and Method: Bite block Placement Confirmation: positive ETCO2 Tube secured with: Tape Dental Injury: Teeth and Oropharynx as per pre-operative assessment

## 2022-07-22 NOTE — Evaluation (Signed)
Physical Therapy Evaluation Patient Details Name: Sean Carter MRN: [id number] DOB: 02-Oct-1971 Today's Date: 07/22/2022  History of Present Illness  51 y/o male presented to ED on 07/20/22 for R second toe swelling and necrosis since 3 days ago. Admitted for sepsis and awaiting R second toe and great toe amputation. PMH: PAD, T2DM, HTN, s/p L great toe amputation  Clinical Impression  Patient admitted with the above. Patient states familiarity with use of RW and heel WB. Practiced in room prior to surgery to assess heel WB on R. No assistance required throughout. Anticipate patient will continue to be modI following surgery if WB restrictions are heel WB. Will follow up post-amputation to assess WB restrictions. No PT follow up recommended at this time.        Recommendations for follow up therapy are one component of a multi-disciplinary discharge planning process, led by the attending physician.  Recommendations may be updated based on patient status, additional functional criteria and insurance authorization.  Follow Up Recommendations No PT follow up      Assistance Recommended at Discharge PRN  Patient can return home with the following       Equipment Recommendations Rolling Arita Severtson (2 wheels)  Recommendations for Other Services       Functional Status Assessment Patient has had a recent decline in their functional status and demonstrates the ability to make significant improvements in function in a reasonable and predictable amount of time.     Precautions / Restrictions Precautions Precautions: None Restrictions Weight Bearing Restrictions: No      Mobility  Bed Mobility Overal bed mobility: Independent                  Transfers Overall transfer level: Independent Equipment used: Rolling Aleta Manternach (2 wheels)               General transfer comment: practiced use of RW and weight on R heel in preparation for upcoming surgery     Ambulation/Gait Ambulation/Gait assistance: Modified independent (Device/Increase time) Gait Distance (Feet): 20 Feet Assistive device: Rolling Marva Hendryx (2 wheels) Gait Pattern/deviations: WFL(Within Functional Limits) Gait velocity: decreased     General Gait Details: practiced heel WB with RW. No assistance required  Stairs            Wheelchair Mobility    Modified Rankin (Stroke Patients Only)       Balance Overall balance assessment: Modified Independent                                           Pertinent Vitals/Pain      Home Living Family/patient expects to be discharged to:: Private residence Living Arrangements: Spouse/significant other Available Help at Discharge: Family;Available 24 hours/day Type of Home: House Home Access: Stairs to enter   [employer] of Steps: 1 plus 1   Home Layout: One level Home Equipment: None      Prior Function Prior Level of Function : Independent/Modified Independent;Driving;Working/employed               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, industrial/product Dominance   Dominant Hand: Right    Extremity/Trunk Assessment   Upper Extremity Assessment Upper Extremity Assessment: Defer to OT evaluation    Lower Extremity Assessment Lower Extremity Assessment: RLE deficits/detail RLE Deficits / Details: 2nd toe necrosis awaiting amputation; neuropathy in B feet RLE Sensation:  decreased light touch;history of peripheral neuropathy    Cervical / Trunk Assessment Cervical / Trunk Assessment: Normal  Communication   Communication: No difficulties  Cognition Arousal/Alertness: Awake/alert Behavior During Therapy: WFL for tasks assessed/performed Overall Cognitive Status: Within Functional Limits for tasks assessed                                          General Comments      Exercises     Assessment/Plan    PT Assessment Patient needs continued PT services  PT Problem  List Decreased balance;Decreased mobility;Decreased strength;Decreased knowledge of precautions       PT Treatment Interventions DME instruction;Gait training;Stair training;Functional mobility training;Therapeutic activities;Therapeutic exercise;Balance training;Patient/family education    PT Goals (Current goals can be found in the Care Plan section)  Acute Rehab PT Goals Patient Stated Goal: to get surgery and go home PT Goal Formulation: With patient Time For Goal Achievement: 08/05/22 Potential to Achieve Goals: Good    Frequency Min 3X/week     Co-evaluation               AM-PAC PT "6 Clicks" Mobility  Outcome Measure Help needed turning from your back to your side while in a flat bed without using bedrails?: None Help needed moving from lying on your back to sitting on the side of a flat bed without using bedrails?: None Help needed moving to and from a bed to a chair (including a wheelchair)?: None Help needed standing up from a chair using your arms (e.g., wheelchair or bedside chair)?: None Help needed to walk in hospital room?: None Help needed climbing 3-5 steps with a railing? : A Little 6 Click Score: 23    End of Session   Activity Tolerance: Patient tolerated treatment well Patient left: in bed;with call bell/phone within reach Nurse Communication: Mobility status PT Visit Diagnosis: Other abnormalities of gait and mobility (R26.89)    Time: 1245-8099 PT Time Calculation (min) (ACUTE ONLY): 19 min   Charges:   PT Evaluation $PT Eval Low Complexity: 1 Low          Dam Ashraf A. Gilford Rile PT, DPT Acute Rehabilitation Services Office [phone]   Linna Hoff 07/22/2022, 10:37 AM

## 2022-07-22 NOTE — Progress Notes (Signed)
PROGRESS NOTE    Sean Carter  ZOX:[id number] DOB: [birth year] DOA: 07/20/2022 PCP: Levin ErpJagadish, Mayuri, MD   Brief Narrative:  51 year old gentleman with significant peripheral vascular disease, history of gangrene of left great toe, hypertension, diabetes mellitus, status post amputation of left great toe presented to the emergency department complaining of right second toe swelling redness and blackening since 3 days ago. Patient also has a nonproductive cough and has had fever at home up to 103.Patient has gangrene in the left great toe and vascular surgery was consulted Dr. Randie Heinzain requested patient be [facility] for a formal vascular surgery consultation.  Patient has been started on broad-spectrum IV antibiotics after blood cultures obtained.  Seen by vascular surgery, planning on amputation.   Assessment & Plan:  Principal Problem:   Necrotic toes ([name]) Active Problems:   Hypertension associated with diabetes ([name])   Hypokalemia   Diabetes mellitus type 2 with peripheral artery disease ([name])   Mixed hyperlipidemia   Diabetic retinopathy ([name])   Gangrene of toe ([name])   Leukocytosis     Assessment and Plan: * Necrotic toes ([name]) Sepsis due to Gangrene of the right second toe and osteomyelitis -Sepsis evidenced by fever, leukocytosis and source.  Improving.  Osteomyelitis seen on x-ray.  On IV vancomycin and Unasyn.  Vascular planning on amputation today  Hypertension associated with diabetes ([name]) - On losartan, HCTZ.  IV as needed.  Diabetic retinopathy ([name]) - Outpatient follow-up with ophthalmology  Mixed hyperlipidemia - Lipitor  Diabetes mellitus type 2 with peripheral artery disease ([name]) - A1c- 6.8 - Sliding scale and Accu-Chek.  Semglee 24 units daily  Hypokalemia - As needed repletion    DVT prophylaxis: Sup q. heparin Code Status: Full code Family Communication:    Planned amputation today, maintain hospital stay  Subjective: Fever  yesterday, but now resolved. No new complaints.   Examination: Constitutional: Not in acute distress Respiratory: Clear to auscultation bilaterally Cardiovascular: Normal sinus rhythm, no rubs Abdomen: Nontender nondistended good bowel sounds Musculoskeletal: No edema noted Skin: necrotic second toe; right Neurologic: CN 2-12 grossly intact.  And nonfocal Psychiatric: Normal judgment and insight. Alert and oriented x 3. Normal mood.           Objective: Vitals:   07/21/22 1805 07/21/22 1956 07/22/22 0513 07/22/22 0739  BP: (!) 153/74 115/64 (!) 159/74 (!) 164/82  Pulse: 87 84 64 70  Resp: 20 18 18 16   Temp: (!) 103 F (39.4 C) (!) 100.9 F (38.3 C) (!) 97.4 F (36.3 C) 98.4 F (36.9 C)  TempSrc: Oral Oral Oral Oral  SpO2: 98% 96% 100% 99%  Weight:      Height:        Intake/Output Summary (Last 24 hours) at 07/22/2022 0813 Last data filed at 07/22/2022 0303 Gross per 24 hour  Intake 3181.69 ml  Output 300 ml  Net 2881.69 ml   Filed Weights   07/20/22 0741  Weight: 93.7 kg     Data Reviewed:   CBC: Recent Labs  Lab 07/20/22 1058 07/20/22 1235 07/21/22 0240 07/22/22 0621  WBC 15.1* 14.1* 16.2* 13.6*  NEUTROABS 11.8*  --  11.7* 9.6*  HGB 10.9* 10.4* 9.6* 9.1*  HCT 32.6* 30.9* 28.0* 26.6*  MCV 86.9 86.6 85.6 87.5  PLT 351 344 299 295   Basic Metabolic Panel: Recent Labs  Lab 07/20/22 1058 07/20/22 1235 07/22/22 0621  NA 135  --  139  K 3.4*  --  3.2*  CL 96*  --  96*  CO2 30  --  29  GLUCOSE 152*  --  150*  BUN 23*  --  21*  CREATININE 1.00 0.97 1.01  CALCIUM 8.4*  --  8.7*  MG  --   --  2.0   GFR: Estimated Creatinine Clearance: 96.1 mL/min (by C-G formula based on SCr of 1.01 mg/dL). Liver Function Tests: Recent Labs  Lab 07/22/22 0621  AST 47*  ALT 50*  ALKPHOS 114  BILITOT 0.7  PROT 6.2*  ALBUMIN 2.4*   No results for input(s): "LIPASE", "AMYLASE" in the last 168 hours. No results for input(s): "AMMONIA" in the last 168  hours. Coagulation Profile: No results for input(s): "INR", "PROTIME" in the last 168 hours. Cardiac Enzymes: No results for input(s): "CKTOTAL", "CKMB", "CKMBINDEX", "TROPONINI" in the last 168 hours. BNP (last 3 results) No results for input(s): "PROBNP" in the last 8760 hours. HbA1C: No results for input(s): "HGBA1C" in the last 72 hours. CBG: Recent Labs  Lab 07/21/22 1208 07/21/22 1627 07/21/22 2054 07/21/22 2237 07/22/22 0539  GLUCAP 142* 165* 179* 190* 147*   Lipid Profile: No results for input(s): "CHOL", "HDL", "LDLCALC", "TRIG", "CHOLHDL", "LDLDIRECT" in the last 72 hours. Thyroid Function Tests: No results for input(s): "TSH", "T4TOTAL", "FREET4", "T3FREE", "THYROIDAB" in the last 72 hours. Anemia Panel: No results for input(s): "VITAMINB12", "FOLATE", "FERRITIN", "TIBC", "IRON", "RETICCTPCT" in the last 72 hours. Sepsis Labs: Recent Labs  Lab 07/20/22 1058 07/20/22 1235 07/21/22 1028  LATICACIDVEN 1.7 1.2 1.1    Recent Results (from the past 240 hour(s))  Blood culture (routine x 2)     Status: None (Preliminary result)   Collection Time: 07/20/22 10:58 AM   Specimen: BLOOD  Result Value Ref Range Status   Specimen Description BLOOD LEFT ASSIST CONTROL  Final   Special Requests   Final    BOTTLES DRAWN AEROBIC AND ANAEROBIC Blood Culture results may not be optimal due to an excessive volume of blood received in culture bottles   Culture   Final    NO GROWTH 2 DAYS Performed at [facility], [address], [place], [place] [address]    Report Status PENDING  Incomplete  Blood culture (routine x 2)     Status: None (Preliminary result)   Collection Time: 07/20/22 10:58 AM   Specimen: BLOOD  Result Value Ref Range Status   Specimen Description BLOOD RIGHT ASSIST CONTROL  Final   Special Requests   Final    BOTTLES DRAWN AEROBIC AND ANAEROBIC Blood Culture adequate volume   Culture   Final    NO GROWTH 2 DAYS Performed at [facility], [address], [place], [place] [address]    Report Status PENDING  Incomplete  Surgical pcr screen     Status: Abnormal   Collection Time: 07/21/22 10:33 PM   Specimen: Nasal Mucosa; Nasal Swab  Result Value Ref Range Status   MRSA, PCR NEGATIVE NEGATIVE Final   Staphylococcus aureus POSITIVE (A) NEGATIVE Final    Comment: (NOTE) The Xpert SA Assay (FDA approved for NASAL specimens in patients 41 years of age and older), is one component of a comprehensive surveillance program. It is not intended to diagnose infection nor to guide or monitor treatment. Performed at [facility], [place] [address] [place], [place] [address]          Radiology Studies: VAS [place] ABI WITH/WO TBI  Result Date: 07/21/2022  LOWER EXTREMITY DOPPLER STUDY Patient Name:  Sean Carter  Date of  Exam:   07/21/2022 Medical Rec #: [id number]      Accession #:    [id number] Date of Birth: Mar 04, 1971     Patient Gender: M Patient Age:   71 years Exam Location:  [facility] Procedure:      VAS [place] ABI WITH/WO TBI Referring Phys: Lemar Livings --------------------------------------------------------------------------------  Indications: Peripheral artery disease. High Risk Factors: Hypertension, hyperlipidemia, Diabetes. Other Factors: Left great toe amputation.  Comparison Study: 06/01/2020 - Right: Resting right ankle-brachial index is                   within normal range. No                   evidence of significant right lower extremity arterial                   disease. The right                   toe-brachial index is normal.                    Left: Resting left ankle-brachial index is within normal                   range. No                   evidence of significant left lower extremity arterial disease.                   The left                   toe-brachial index is normal. Performing Technologist: Olen Cordial RVT  Examination Guidelines: A complete evaluation includes at minimum, Doppler waveform signals and systolic  blood pressure reading at the level of bilateral brachial, anterior tibial, and posterior tibial arteries, when vessel segments are accessible. Bilateral testing is considered an integral part of a complete examination. Photoelectric Plethysmograph (PPG) waveforms and toe systolic pressure readings are included as required and additional duplex testing as needed. Limited examinations for reoccurring indications may be performed as noted.  ABI Findings: +---------+------------------+-----+-----------+--------+ Right    Rt Pressure (mmHg)IndexWaveform   Comment  +---------+------------------+-----+-----------+--------+ Brachial 131                    triphasic           +---------+------------------+-----+-----------+--------+ PTA      134               1.00 multiphasic         +---------+------------------+-----+-----------+--------+ DP       170               1.27 multiphasic         +---------+------------------+-----+-----------+--------+ Great Toe90                0.67                     +---------+------------------+-----+-----------+--------+ +---------+------------------+-----+-----------+----------+ Left     Lt Pressure (mmHg)IndexWaveform   Comment    +---------+------------------+-----+-----------+----------+ Brachial 134                    triphasic             +---------+------------------+-----+-----------+----------+ PTA      147               1.10 multiphasic           +---------+------------------+-----+-----------+----------+  DP       175               1.31 multiphasic           +---------+------------------+-----+-----------+----------+ Great Toe                                  Amputation +---------+------------------+-----+-----------+----------+ +-------+-----------+-----------+------------+------------+ ABI/TBIToday's ABIToday's TBIPrevious ABIPrevious TBI +-------+-----------+-----------+------------+------------+ Right  1.27        0.67                                +-------+-----------+-----------+------------+------------+ Left   1.31                                           +-------+-----------+-----------+------------+------------+  Summary: Right: Resting right ankle-brachial index is within normal range. The right toe-brachial index is abnormal. Left: Resting left ankle-brachial index indicates noncompressible left lower extremity arteries. Unable to obtain TBI due to great toe amputation. *See table(s) above for measurements and observations.  Electronically signed by Waverly Ferrari MD on 07/21/2022 at 4:49:58 PM.    Final    DG Chest Portable 1 View  Result Date: 07/20/2022 CLINICAL DATA:  Cough since Friday EXAM: PORTABLE CHEST 1 VIEW COMPARISON:  Portable exam 0932 hours compared to 06/04/2020 FINDINGS: Upper normal heart size. Mediastinal contours and pulmonary vascularity normal. Minimal RIGHT basilar atelectasis. Lungs otherwise clear. No pulmonary infiltrate, pleural effusion, or pneumothorax. Osseous structures unremarkable. IMPRESSION: Minimal RIGHT basilar atelectasis. Electronically Signed   By: Ulyses Southward M.D.   On: 07/20/2022 10:01   DG Foot Complete Right  Result Date: 07/20/2022 CLINICAL DATA:  RIGHT second toe injury, necrotic, diabetes mellitus EXAM: RIGHT FOOT COMPLETE - 3+ VIEW COMPARISON:  None Available. FINDINGS: Dressing artifacts and marked soft tissue swelling at second toe. Osseous demineralization. Joint spaces preserved. Unable to exclude foci of soft tissue gas at second toe due to dressing artifacts. Bone destruction at lateral aspect base of proximal phalanx second toe consistent with osteomyelitis. No acute fracture, dislocation, or additional bone destruction. Plantar calcaneal spur. Additional spur formation at dorsal margin of talonavicular joint. IMPRESSION: Extensive soft tissue swelling and dressing artifacts at RIGHT second toe with evidence of osteomyelitis at the  base of the distal phalanx. Electronically Signed   By: Ulyses Southward M.D.   On: 07/20/2022 10:00        Scheduled Meds:  atorvastatin  40 mg Oral QPM   Chlorhexidine Gluconate Cloth  6 each Topical Q0600   fluticasone  2 spray Each Nare Daily   heparin  5,000 Units Subcutaneous Q8H   hydrochlorothiazide  25 mg Oral Daily   insulin aspart  0-15 Units Subcutaneous TID WC   insulin aspart  8 Units Subcutaneous TID WC   insulin glargine-yfgn  24 Units Subcutaneous Daily   losartan  100 mg Oral Daily   montelukast  10 mg Oral QHS   multivitamin with minerals  1 tablet Oral Daily   mupirocin ointment  1 Application Nasal BID   Continuous Infusions:  ampicillin-sulbactam (UNASYN) IV 3 g (07/22/22 0303)   lactated ringers 55 mL/hr at 07/22/22 0339   potassium chloride     vancomycin 1,250 mg (07/22/22 0340)     LOS: 2 days  Time spent= 35 mins    Reshunda Strider Joline Maxcy, MD [facility]  If 7PM-7AM, please contact night-coverage  07/22/2022, 8:13 AM

## 2022-07-22 NOTE — Interval H&P Note (Signed)
History and Physical Interval Note:  07/22/2022 3:15 PM  Sean Carter  has presented today for surgery, with the diagnosis of Peripheral Vascular Disease with gangrene of Right 1st and 2nd Toes.  The various methods of treatment have been discussed with the patient and family. After consideration of risks, benefits and other options for treatment, the patient has consented to  Procedure(s): RIGHT FIRST AND SECOND TOE AMPUTATION (Right) as a surgical intervention.  The patient's history has been reviewed, patient examined, no change in status, stable for surgery.  I have reviewed the patient's chart and labs.  Questions were answered to the patient's satisfaction.     [name]

## 2022-07-22 NOTE — Transfer of Care (Signed)
Immediate Anesthesia Transfer of Care Note  Patient: [name]  Procedure(s) Performed: RIGHT FIRST AND SECOND RAY PARTIAL  AMPUTATION (Right)  Patient Location: PACU  Anesthesia Type:General  Level of Consciousness: awake and drowsy  Airway & Oxygen Therapy: Patient Spontanous Breathing and Patient connected to face mask oxygen  Post-op Assessment: Report given to RN and Post -op Vital signs reviewed and stable  Post vital signs: Reviewed and stable  Last Vitals:  Vitals Value Taken Time  BP 157/88 07/22/22 1638  Temp    Pulse 96 07/22/22 1638  Resp 18 07/22/22 1640  SpO2 99 % 07/22/22 1638  Vitals shown include unvalidated device data.  Last Pain:  Vitals:   07/22/22 1437  TempSrc:   PainSc: 0-No pain      Patients Stated Pain Goal: 0 (44/97/53 0051)  Complications: No notable events documented.

## 2022-07-22 NOTE — Op Note (Signed)
DATE OF SERVICE: 07/22/2022  PATIENT:  Sean Carter  51 y.o. male  PRE-OPERATIVE DIAGNOSIS:  right diabetic foot infection with necrotic second toe  POST-OPERATIVE DIAGNOSIS:  Same  PROCEDURE:   right first and second partial ray amputation  SURGEON:  Surgeon(s) and Role:    * [name], MD - Primary  ASSISTANT: Gerri Lins, PA-C  An experienced assistant was required given the complexity of this procedure and the standard of surgical care. My assistant helped with exposure through counter tension, suctioning, ligation and retraction to better visualize the surgical field.  My assistant expedited sewing during the case by following my sutures. Wherever I use the term "we" in the report, my assistant actively helped me with that portion of the procedure.  ANESTHESIA:   general  EBL: <24mL  BLOOD ADMINISTERED:none  DRAINS: none   LOCAL MEDICATIONS USED:  NONE  SPECIMEN:  culture / gram stain  COUNTS: confirmed correct.  TOURNIQUET:  none  PATIENT DISPOSITION:  PACU - hemodynamically stable.   Delay start of Pharmacological VTE agent (>24hrs) due to surgical blood loss or risk of bleeding: no  INDICATION FOR PROCEDURE: Sean Carter is a 51 y.o. male with right diabetic foot infection. After careful discussion of risks, benefits, and alternatives the patient was offered right first and second toe amputation. The patient understood and wished to proceed.  Good bleeding seen throughout the foot.  OPERATIVE FINDINGS: Purulence in the base of second toe.  Culture and Gram stain taken.  Wide debridement performed.  Primary closure achieved with staples and sutures.  DESCRIPTION OF PROCEDURE: After identification of the patient in the pre-operative holding area, the patient was transferred to the operating room. The patient was positioned supine on the operating room table. Anesthesia was induced. The right foot was prepped and draped in standard fashion. A surgical pause  was performed confirming correct patient, procedure, and operative location.  A fishmouth incision was made around the base of the first and second toe on the right.  Incision was carried down through subcutaneous tissue until the bone was encountered.  Bone was freed up from its attachments.  Partial ray amputations were performed of the first and second toe.  The toe was passed off the field for disposal.  A pocket of purulence was encountered and culture taken.  Bone was debrided back until closure could be achieved.  Closure was achieved with 2-0 nylon and surgical staples.  Xeroform, gauze, Kerlix, and Coban was applied to the foot.  Upon completion of the case instrument and sharps counts were confirmed correct. The patient was transferred to the PACU in good condition. I was present for all portions of the procedure.  Yevonne Aline. Stanford Breed, MD [facility] Phone Number: [phone] 07/22/2022 4:22 PM

## 2022-07-23 ENCOUNTER — Encounter (HOSPITAL_COMMUNITY): Payer: Self-pay | Admitting: Vascular Surgery

## 2022-07-23 DIAGNOSIS — I96 Gangrene, not elsewhere classified: Secondary | ICD-10-CM | POA: Diagnosis not present

## 2022-07-23 LAB — COMPREHENSIVE METABOLIC PANEL
ALT: 48 U/L — ABNORMAL HIGH (ref 0–44)
AST: 40 U/L (ref 15–41)
Albumin: 2.6 g/dL — ABNORMAL LOW (ref 3.5–5.0)
Alkaline Phosphatase: 127 U/L — ABNORMAL HIGH (ref 38–126)
Anion gap: 11 (ref 5–15)
BUN: 18 mg/dL (ref 6–20)
CO2: 26 mmol/L (ref 22–32)
Calcium: 8.5 mg/dL — ABNORMAL LOW (ref 8.9–10.3)
Chloride: 94 mmol/L — ABNORMAL LOW (ref 98–111)
Creatinine, Ser: 1.25 mg/dL — ABNORMAL HIGH (ref 0.61–1.24)
GFR, Estimated: >60 mL/min (ref >60)
Glucose, Bld: 328 mg/dL — ABNORMAL HIGH (ref 70–99)
Potassium: 4.2 mmol/L (ref 3.5–5.1)
Sodium: 131 mmol/L — ABNORMAL LOW (ref 135–145)
Total Bilirubin: 0.8 mg/dL (ref 0.3–1.2)
Total Protein: 6.7 g/dL (ref 6.5–8.1)

## 2022-07-23 LAB — CBC WITH DIFFERENTIAL/PLATELET
Abs Immature Granulocytes: 0.16 10*3/uL — ABNORMAL HIGH (ref 0.00–0.07)
Basophils Absolute: 0 10*3/uL (ref 0.0–0.1)
Basophils Relative: 0 %
Eosinophils Absolute: 0 10*3/uL (ref 0.0–0.5)
Eosinophils Relative: 0 %
HCT: 30.1 % — ABNORMAL LOW (ref 39.0–52.0)
Hemoglobin: 10.5 g/dL — ABNORMAL LOW (ref 13.0–17.0)
Immature Granulocytes: 1 %
Lymphocytes Relative: 7 %
Lymphs Abs: 1.2 10*3/uL (ref 0.7–4.0)
MCH: 29.9 pg (ref 26.0–34.0)
MCHC: 34.9 g/dL (ref 30.0–36.0)
MCV: 85.8 fL (ref 80.0–100.0)
Monocytes Absolute: 0.7 10*3/uL (ref 0.1–1.0)
Monocytes Relative: 4 %
Neutro Abs: 14.1 10*3/uL — ABNORMAL HIGH (ref 1.7–7.7)
Neutrophils Relative %: 88 %
Platelets: 356 10*3/uL (ref 150–400)
RBC: 3.51 MIL/uL — ABNORMAL LOW (ref 4.22–5.81)
RDW: 12.2 % (ref 11.5–15.5)
WBC: 16.1 10*3/uL — ABNORMAL HIGH (ref 4.0–10.5)
nRBC: 0 % (ref 0.0–0.2)

## 2022-07-23 LAB — MAGNESIUM: Magnesium: 1.8 mg/dL (ref 1.7–2.4)

## 2022-07-23 LAB — GLUCOSE, CAPILLARY
Glucose-Capillary: 270 mg/dL — ABNORMAL HIGH (ref 70–99)
Glucose-Capillary: 236 mg/dL — ABNORMAL HIGH (ref 70–99)
Glucose-Capillary: 229 mg/dL — ABNORMAL HIGH (ref 70–99)
Glucose-Capillary: 214 mg/dL — ABNORMAL HIGH (ref 70–99)
Glucose-Capillary: 112 mg/dL — ABNORMAL HIGH (ref 70–99)

## 2022-07-23 LAB — VANCOMYCIN, PEAK: Vancomycin Pk: 30 ug/mL (ref 30–40)

## 2022-07-23 NOTE — Progress Notes (Signed)
PROGRESS NOTE    Sean Carter  OZH:[id number] DOB: February 06, 1971 DOA: 07/20/2022 PCP: Levin Erp, MD   Brief Narrative:  51 year old gentleman with significant peripheral vascular disease, history of gangrene of left great toe, hypertension, diabetes mellitus, status post amputation of left great toe presented to the emergency department complaining of right second toe swelling redness and blackening since 3 days ago. Patient also has a nonproductive cough and has had fever at home up to 103.Patient has gangrene in the left great toe and vascular surgery was consulted Dr. Randie Heinz requested patient be admitted to [facility] for a formal vascular surgery consultation.  Patient has been started on broad-spectrum IV antibiotics after blood cultures obtained.  Patient was seen by vascular team, underwent right first and second partial ray amputation 10/4.   Assessment & Plan:  Principal Problem:   Necrotic toes ([name]) Active Problems:   Hypertension associated with diabetes ([name])   Hypokalemia   Diabetes mellitus type 2 with peripheral artery disease ([name])   Mixed hyperlipidemia   Diabetic retinopathy ([name])   Gangrene of toe ([name])   Leukocytosis     Assessment and Plan: * Necrotic toes ([name]), diabetic Sepsis due to Gangrene of the right second toe and osteomyelitis Status post partial first and second ray amputation -Sepsis improved, x-ray showed osteomyelitis.  Seen by vascular, status post amputation.  Follow-up cultures  Hypertension associated with diabetes ([name]) - On losartan, HCTZ.  IV as needed.  Diabetic retinopathy ([name]) - Outpatient follow-up with ophthalmology  Mixed hyperlipidemia - Lipitor  Diabetes mellitus type 2 with peripheral artery disease ([name]) - A1c- 6.8 - Sliding scale and Accu-Chek.  Semglee 24 units daily  Hypokalemia - As needed repletion    DVT prophylaxis: Sup q. heparin Code Status: Full code Family Communication:    We will discharge once  cleared by vascular  Subjective: Very minimal discomfort at the surgical site, overall feels okay otherwise.  Examination: Constitutional: Not in acute distress Respiratory: Clear to auscultation bilaterally Cardiovascular: Normal sinus rhythm, no rubs Abdomen: Nontender nondistended good bowel sounds Musculoskeletal: No edema noted Skin: Right lower extremity dressing in place Neurologic: CN 2-12 grossly intact.  And nonfocal Psychiatric: Normal judgment and insight. Alert and oriented x 3. Normal mood.          Objective: Vitals:   07/22/22 2201 07/23/22 0429 07/23/22 0507 07/23/22 0621  BP: (!) 143/67 (!) 193/92 (!) 182/91 (!) 159/84  Pulse: 76 83  79  Resp:      Temp: 98.5 F (36.9 C) 97.9 F (36.6 C)    TempSrc: Oral Oral    SpO2: 100% 95%    Weight:      Height:        Intake/Output Summary (Last 24 hours) at 07/23/2022 0827 Last data filed at 07/23/2022 0500 Gross per 24 hour  Intake 2355.58 ml  Output 1070 ml  Net 1285.58 ml   Filed Weights   07/20/22 0741  Weight: 93.7 kg     Data Reviewed:   CBC: Recent Labs  Lab 07/20/22 1058 07/20/22 1235 07/21/22 0240 07/22/22 0621 07/23/22 0240  WBC 15.1* 14.1* 16.2* 13.6* 16.1*  NEUTROABS 11.8*  --  11.7* 9.6* 14.1*  HGB 10.9* 10.4* 9.6* 9.1* 10.5*  HCT 32.6* 30.9* 28.0* 26.6* 30.1*  MCV 86.9 86.6 85.6 87.5 85.8  PLT 351 344 299 295 356   Basic Metabolic Panel: Recent Labs  Lab 07/20/22 1058 07/20/22 1235 07/22/22 0621 07/23/22 0240  NA 135  --  139 131*  K 3.4*  --  3.2* 4.2  CL 96*  --  96* 94*  CO2 30  --  29 26  GLUCOSE 152*  --  150* 328*  BUN 23*  --  21* 18  CREATININE 1.00 0.97 1.01 1.25*  CALCIUM 8.4*  --  8.7* 8.5*  MG  --   --  2.0 1.8   GFR: Estimated Creatinine Clearance: 77.6 mL/min (A) (by C-G formula based on SCr of 1.25 mg/dL (H)). Liver Function Tests: Recent Labs  Lab 07/22/22 0621 07/23/22 0240  AST 47* 40  ALT 50* 48*  ALKPHOS 114 127*  BILITOT 0.7 0.8   PROT 6.2* 6.7  ALBUMIN 2.4* 2.6*   No results for input(s): "LIPASE", "AMYLASE" in the last 168 hours. No results for input(s): "AMMONIA" in the last 168 hours. Coagulation Profile: No results for input(s): "INR", "PROTIME" in the last 168 hours. Cardiac Enzymes: No results for input(s): "CKTOTAL", "CKMB", "CKMBINDEX", "TROPONINI" in the last 168 hours. BNP (last 3 results) No results for input(s): "PROBNP" in the last 8760 hours. HbA1C: No results for input(s): "HGBA1C" in the last 72 hours. CBG: Recent Labs  Lab 07/22/22 1200 07/22/22 1447 07/22/22 1636 07/22/22 2203 07/23/22 0441  GLUCAP 128* 135* 117* 315* 270*   Lipid Profile: No results for input(s): "CHOL", "HDL", "LDLCALC", "TRIG", "CHOLHDL", "LDLDIRECT" in the last 72 hours. Thyroid Function Tests: No results for input(s): "TSH", "T4TOTAL", "FREET4", "T3FREE", "THYROIDAB" in the last 72 hours. Anemia Panel: No results for input(s): "VITAMINB12", "FOLATE", "FERRITIN", "TIBC", "IRON", "RETICCTPCT" in the last 72 hours. Sepsis Labs: Recent Labs  Lab 07/20/22 1058 07/20/22 1235 07/21/22 1028  LATICACIDVEN 1.7 1.2 1.1    Recent Results (from the past 240 hour(s))  Blood culture (routine x 2)     Status: None (Preliminary result)   Collection Time: 07/20/22 10:58 AM   Specimen: BLOOD  Result Value Ref Range Status   Specimen Description BLOOD LEFT ASSIST CONTROL  Final   Special Requests   Final    BOTTLES DRAWN AEROBIC AND ANAEROBIC Blood Culture results may not be optimal due to an excessive volume of blood received in culture bottles   Culture   Final    NO GROWTH 3 DAYS Performed at [facility], [address] [place], [place] [address]    Report Status PENDING  Incomplete  Blood culture (routine x 2)     Status: None (Preliminary result)   Collection Time: 07/20/22 10:58 AM   Specimen: BLOOD  Result Value Ref Range Status   Specimen Description BLOOD RIGHT ASSIST CONTROL  Final   Special Requests    Final    BOTTLES DRAWN AEROBIC AND ANAEROBIC Blood Culture adequate volume   Culture   Final    NO GROWTH 3 DAYS Performed at [facility], [address] [place], [place] [address]    Report Status PENDING  Incomplete  Surgical pcr screen     Status: Abnormal   Collection Time: 07/21/22 10:33 PM   Specimen: Nasal Mucosa; Nasal Swab  Result Value Ref Range Status   MRSA, PCR NEGATIVE NEGATIVE Final   Staphylococcus aureus POSITIVE (A) NEGATIVE Final    Comment: (NOTE) The Xpert SA Assay (FDA approved for NASAL specimens in patients 50 years of age and older), is one component of a comprehensive surveillance program. It is not intended to diagnose infection nor to guide or monitor treatment. Performed at [facility], [address], [place], [place] [address]  Aerobic/Anaerobic Culture w Gram Stain (surgical/deep wound)     Status: None (Preliminary result)   Collection Time: 07/22/22  3:53 PM   Specimen: PATH Digit amputation; Tissue  Result Value Ref Range Status   Specimen Description TISSUE  Final   Special Requests RIGHT 1ST AND 2ND TOES PT ON UNASYN  Final   Gram Stain   Final    ABUNDANT WBC PRESENT,BOTH PMN AND MONONUCLEAR FEW GRAM NEGATIVE RODS RARE GRAM POSITIVE COCCI IN PAIRS    Culture   Final    TOO YOUNG TO READ Performed at [facility], [address], [place], [place] [address]    Report Status PENDING  Incomplete         Radiology Studies: VAS [place] ABI WITH/WO TBI  Result Date: 07/21/2022  LOWER EXTREMITY DOPPLER STUDY Patient Name:  VERLYN LAMBERT  Date of Exam:   07/21/2022 Medical Rec #: [id number]      Accession #:    [id number] Date of Birth: [birth year]     Patient Gender: M Patient Age:   74 years Exam Location:  [facility] Procedure:      VAS [place] ABI WITH/WO TBI Referring Phys: Lemar Livings --------------------------------------------------------------------------------  Indications: Peripheral artery disease. High Risk Factors:  Hypertension, hyperlipidemia, Diabetes. Other Factors: Left great toe amputation.  Comparison Study: 06/01/2020 - Right: Resting right ankle-brachial index is                   within normal range. No                   evidence of significant right lower extremity arterial                   disease. The right                   toe-brachial index is normal.                    Left: Resting left ankle-brachial index is within normal                   range. No                   evidence of significant left lower extremity arterial disease.                   The left                   toe-brachial index is normal. Performing Technologist: Olen Cordial RVT  Examination Guidelines: A complete evaluation includes at minimum, Doppler waveform signals and systolic blood pressure reading at the level of bilateral brachial, anterior tibial, and posterior tibial arteries, when vessel segments are accessible. Bilateral testing is considered an integral part of a complete examination. Photoelectric Plethysmograph (PPG) waveforms and toe systolic pressure readings are included as required and additional duplex testing as needed. Limited examinations for reoccurring indications may be performed as noted.  ABI Findings: +---------+------------------+-----+-----------+--------+ Right    Rt Pressure (mmHg)IndexWaveform   Comment  +---------+------------------+-----+-----------+--------+ Brachial 131                    triphasic           +---------+------------------+-----+-----------+--------+ PTA      134               1.00 multiphasic         +---------+------------------+-----+-----------+--------+  DP       170               1.27 multiphasic         +---------+------------------+-----+-----------+--------+ Great Toe90                0.67                     +---------+------------------+-----+-----------+--------+ +---------+------------------+-----+-----------+----------+ Left     Lt Pressure  (mmHg)IndexWaveform   Comment    +---------+------------------+-----+-----------+----------+ Brachial 134                    triphasic             +---------+------------------+-----+-----------+----------+ PTA      147               1.10 multiphasic           +---------+------------------+-----+-----------+----------+ DP       175               1.31 multiphasic           +---------+------------------+-----+-----------+----------+ Great Toe                                  Amputation +---------+------------------+-----+-----------+----------+ +-------+-----------+-----------+------------+------------+ ABI/TBIToday's ABIToday's TBIPrevious ABIPrevious TBI +-------+-----------+-----------+------------+------------+ Right  1.27       0.67                                +-------+-----------+-----------+------------+------------+ Left   1.31                                           +-------+-----------+-----------+------------+------------+  Summary: Right: Resting right ankle-brachial index is within normal range. The right toe-brachial index is abnormal. Left: Resting left ankle-brachial index indicates noncompressible left lower extremity arteries. Unable to obtain TBI due to great toe amputation. *See table(s) above for measurements and observations.  Electronically signed by [name] MD on 07/21/2022 at 4:49:58 PM.    Final         Scheduled Meds:  atorvastatin  40 mg Oral QPM   Chlorhexidine Gluconate Cloth  6 each Topical Q0600   fluticasone  2 spray Each Nare Daily   heparin  5,000 Units Subcutaneous Q8H   hydrochlorothiazide  25 mg Oral Daily   insulin aspart  0-15 Units Subcutaneous TID WC   insulin aspart  8 Units Subcutaneous TID WC   insulin glargine-yfgn  24 Units Subcutaneous Daily   losartan  100 mg Oral Daily   montelukast  10 mg Oral QHS   multivitamin with minerals  1 tablet Oral Daily   mupirocin ointment  1 Application Nasal  BID   Continuous Infusions:  ampicillin-sulbactam (UNASYN) IV 3 g (07/23/22 0141)   lactated ringers 55 mL/hr at 07/22/22 1900   vancomycin 1,250 mg (07/23/22 0447)     LOS: 3 days   Time spent= 35 mins    Ludivina Guymon Arsenio Loader, MD [facility]  If 7PM-7AM, please contact night-coverage  07/23/2022, 8:27 AM

## 2022-07-23 NOTE — Progress Notes (Addendum)
  Progress Note    07/23/2022 7:44 AM 1 Day Post-Op  Subjective:  Feeling some soreness in his right foot    Vitals:   07/23/22 0507 07/23/22 0621  BP: (!) 182/91 (!) 159/84  Pulse:  79  Resp:    Temp:    SpO2:      Physical Exam: Lungs:  nonlabored Incisions: dry dressing on R foot Extremities:  RLE well perfused   CBC    Component Value Date/Time   WBC 16.1 (H) 07/23/2022 0240   RBC 3.51 (L) 07/23/2022 0240   HGB 10.5 (L) 07/23/2022 0240   HCT 30.1 (L) 07/23/2022 0240   PLT 356 07/23/2022 0240   MCV 85.8 07/23/2022 0240   MCH 29.9 07/23/2022 0240   MCHC 34.9 07/23/2022 0240   RDW 12.2 07/23/2022 0240   LYMPHSABS 1.2 07/23/2022 0240   MONOABS 0.7 07/23/2022 0240   EOSABS 0.0 07/23/2022 0240   BASOSABS 0.0 07/23/2022 0240    BMET    Component Value Date/Time   NA 131 (L) 07/23/2022 0240   NA 142 11/17/2021 1732   K 4.2 07/23/2022 0240   CL 94 (L) 07/23/2022 0240   CO2 26 07/23/2022 0240   GLUCOSE 328 (H) 07/23/2022 0240   BUN 18 07/23/2022 0240   BUN 37 (H) 11/17/2021 1732   CREATININE 1.25 (H) 07/23/2022 0240   CREATININE 0.78 07/10/2020 0910   CALCIUM 8.5 (L) 07/23/2022 0240   GFRNONAA >60 07/23/2022 0240   GFRNONAA 106 07/10/2020 0910   GFRAA 123 07/10/2020 0910    INR    Component Value Date/Time   INR 1.1 05/30/2020 1049     Intake/Output Summary (Last 24 hours) at 07/23/2022 0744 Last data filed at 07/23/2022 0500 Gross per 24 hour  Intake 2355.58 ml  Output 1070 ml  Net 1285.58 ml     Assessment/Plan:  51 y.o. male is 1 day post op, s/p: R 1st and 2nd partial ray amputation   -Cultures taken of the wound during surgery are pending -Dry dressing on R foot. Will take down tomorrow  -On broad spectrum abx, vancomycin and ampicillin-sulbactam  Vicente Serene, PA-C [facility] [phone] 07/23/2022 7:44 AM  I have independently interviewed and examined patient and agree with PA assessment and plan above.   Dressing down tomorrow and could possibly discharge.  Hawke Villalpando C. Donzetta Matters, MD [facility] [place] Office: [phone] Pager: [phone]

## 2022-07-23 NOTE — Progress Notes (Signed)
Physical Therapy Treatment & Discharge  Patient Details Name: Sean Carter MRN: [id number] DOB: 29-Dec-1970 Today's Date: 07/23/2022   History of Present Illness 51 y/o male presented to ED on 07/20/22 for R second toe swelling and necrosis since 3 days ago. Admitted for sepsis. S/p R second toe and great toe amputation on 10/4. PMH: PAD, T2DM, HTN, s/p L great toe amputation    PT Comments    Patient able to maintain heel WB on R with mobility. No darco shoe in room, will place order. Functioning at modI level with RW. Emphasis on keeping weight off ball of foot to allow healing. No PT follow up recommended at this time. PT will sign off.     Recommendations for follow up therapy are one component of a multi-disciplinary discharge planning process, led by the attending physician.  Recommendations may be updated based on patient status, additional functional criteria and insurance authorization.  Follow Up Recommendations  No PT follow up     Assistance Recommended at Discharge PRN  Patient can return home with the following     Equipment Recommendations  Rolling Paidyn Mcferran (2 wheels)    Recommendations for Other Services       Precautions / Restrictions Precautions Precautions: None Restrictions Weight Bearing Restrictions: Yes RLE Weight Bearing:  (Heel WB) Other Position/Activity Restrictions: heel WB in darco shoe     Mobility  Bed Mobility Overal bed mobility: Independent                  Transfers Overall transfer level: Modified independent Equipment used: Rolling Abbygael Curtiss (2 wheels)                    Ambulation/Gait Ambulation/Gait assistance: Modified independent (Device/Increase time) Gait Distance (Feet): 5 Feet Assistive device: Rolling Deborah Lazcano (2 wheels)   Gait velocity: decreased     General Gait Details: able to maintain heel WB. Darco shoe not present in room   Stairs             Wheelchair Mobility    Modified Rankin  (Stroke Patients Only)       Balance Overall balance assessment: Modified Independent                                          Cognition Arousal/Alertness: Awake/alert Behavior During Therapy: WFL for tasks assessed/performed Overall Cognitive Status: Within Functional Limits for tasks assessed                                          Exercises      General Comments        Pertinent Vitals/Pain Pain Assessment Pain Assessment: Faces Faces Pain Scale: Hurts little more Pain Location: R foot Pain Descriptors / Indicators: Grimacing Pain Intervention(s): Monitored during session    Home Living                          Prior Function            PT Goals (current goals can now be found in the care plan section) Acute Rehab PT Goals PT Goal Formulation: All assessment and education complete, DC therapy Progress towards PT goals: Goals met/education completed, patient discharged from PT  Frequency    Min 3X/week      PT Plan Current plan remains appropriate    Co-evaluation              AM-PAC PT "6 Clicks" Mobility   Outcome Measure  Help needed turning from your back to your side while in a flat bed without using bedrails?: None Help needed moving from lying on your back to sitting on the side of a flat bed without using bedrails?: None Help needed moving to and from a bed to a chair (including a wheelchair)?: None Help needed standing up from a chair using your arms (e.g., wheelchair or bedside chair)?: None Help needed to walk in hospital room?: None Help needed climbing 3-5 steps with a railing? : None 6 Click Score: 24    End of Session   Activity Tolerance: Patient tolerated treatment well Patient left: in bed;with call bell/phone within reach Nurse Communication: Mobility status PT Visit Diagnosis: Other abnormalities of gait and mobility (R26.89)     Time: 1586-8257 PT Time Calculation  (min) (ACUTE ONLY): 11 min  Charges:  $Therapeutic Activity: 8-22 mins                     Joie Reamer A. Gilford Rile PT, DPT Acute Rehabilitation Services Office [phone]    Linna Hoff 07/23/2022, 5:01 PM

## 2022-07-23 NOTE — Anesthesia Postprocedure Evaluation (Signed)
Anesthesia Post Note  Patient: Sean Carter  Procedure(s) Performed: RIGHT FIRST AND SECOND RAY PARTIAL  AMPUTATION (Right)     Patient location during evaluation: PACU Anesthesia Type: General Level of consciousness: awake and alert Pain management: pain level controlled Vital Signs Assessment: post-procedure vital signs reviewed and stable Respiratory status: spontaneous breathing, nonlabored ventilation, respiratory function stable and patient connected to nasal cannula oxygen Cardiovascular status: blood pressure returned to baseline and stable Postop Assessment: no apparent nausea or vomiting Anesthetic complications: no   No notable events documented.  Last Vitals:  Vitals:   07/23/22 0836 07/23/22 1639  BP: (!) 151/80 134/70  Pulse: 78 74  Resp: 18 18  Temp: 36.8 C 37.2 C  SpO2: 97% 99%    Last Pain:  Vitals:   07/23/22 1639  TempSrc: Oral  PainSc:                  Santa Lighter

## 2022-07-23 NOTE — Progress Notes (Signed)
Pharmacy Antibiotic Note  Sean Carter is a 51 y.o. male admitted on 07/20/2022 with cellulitis and wound infection .  Pharmacy has been consulted for vancomycin and Unasyn dosing.  Patient underwent right 1st and 2nd partial ray amputation with vascular surgery on 10/4. Operative cultures were obtained.  Scr slightly elevated to 1.25. Baseline appears to e 0.7-1 - UOP appears appropriate.  Plan: Continue vancomycin 1250mg  Q12H (eAUC 495 w/Vd 0.72 - Scr 1) Will obtain levels after tonight's dose to assess if therapeutic. Goal AUC 400-550 Continue Unasyn 3g Q6H Awaiting final cultures and whether source control was achieved with amputation to determine duration of therapy.  Height: 5\' 8"  (172.7 cm) Weight: 93.7 kg (206 lb 9.1 oz) IBW/kg (Calculated) : 68.4  Temp (24hrs), Avg:98.5 F (36.9 C), Min:97.5 F (36.4 C), Max:99.7 F (37.6 C)  Recent Labs  Lab 07/20/22 1058 07/20/22 1235 07/21/22 0240 07/21/22 1028 07/22/22 0621 07/23/22 0240  WBC 15.1* 14.1* 16.2*  --  13.6* 16.1*  CREATININE 1.00 0.97  --   --  1.01 1.25*  LATICACIDVEN 1.7 1.2  --  1.1  --   --      Estimated Creatinine Clearance: 77.6 mL/min (A) (by C-G formula based on SCr of 1.25 mg/dL (H)).    Allergies  Allergen Reactions   Scallops [Shellfish Allergy] Anaphylaxis    Antimicrobials this admission: Vanco 10/2 >> Unasyn 10/2 >>  Microbiology results: 10/2 BCx: NGTD 10/4 Amputation: GNR, GPC in pairs  Thank you for allowing pharmacy to be a part of this patient's care.  Erskine Speed, PharmD Clinical Pharmacist 07/23/2022 9:26 AM

## 2022-07-24 DIAGNOSIS — I96 Gangrene, not elsewhere classified: Secondary | ICD-10-CM | POA: Diagnosis not present

## 2022-07-24 LAB — COMPREHENSIVE METABOLIC PANEL
ALT: 64 U/L — ABNORMAL HIGH (ref 0–44)
AST: 53 U/L — ABNORMAL HIGH (ref 15–41)
Albumin: 2.4 g/dL — ABNORMAL LOW (ref 3.5–5.0)
Alkaline Phosphatase: 111 U/L (ref 38–126)
Anion gap: 10 (ref 5–15)
BUN: 20 mg/dL (ref 6–20)
CO2: 28 mmol/L (ref 22–32)
Calcium: 8.6 mg/dL — ABNORMAL LOW (ref 8.9–10.3)
Chloride: 101 mmol/L (ref 98–111)
Creatinine, Ser: 1.18 mg/dL (ref 0.61–1.24)
GFR, Estimated: >60 mL/min (ref >60)
Glucose, Bld: 105 mg/dL — ABNORMAL HIGH (ref 70–99)
Potassium: 3.8 mmol/L (ref 3.5–5.1)
Sodium: 139 mmol/L (ref 135–145)
Total Bilirubin: 0.4 mg/dL (ref 0.3–1.2)
Total Protein: 6 g/dL — ABNORMAL LOW (ref 6.5–8.1)

## 2022-07-24 LAB — GLUCOSE, CAPILLARY
Glucose-Capillary: 109 mg/dL — ABNORMAL HIGH (ref 70–99)
Glucose-Capillary: 158 mg/dL — ABNORMAL HIGH (ref 70–99)

## 2022-07-24 LAB — MAGNESIUM: Magnesium: 1.8 mg/dL (ref 1.7–2.4)

## 2022-07-24 LAB — VANCOMYCIN, TROUGH: Vancomycin Tr: 27 ug/mL (ref 15–20)

## 2022-07-24 MED ORDER — ALBUTEROL SULFATE HFA 108 (90 BASE) MCG/ACT IN AERS: 2.0000 | INHALATION_SPRAY | Freq: Four times a day (QID) | RESPIRATORY_TRACT | 0 refills | Status: DC | PRN

## 2022-07-24 MED ORDER — VANCOMYCIN HCL IN DEXTROSE 1-5 GM/200ML-% IV SOLN
1000.0000 mg | Freq: Two times a day (BID) | INTRAVENOUS | Status: DC
  Administered 2022-07-24: 1000 mg via INTRAVENOUS
  Filled 2022-07-24 (×2): qty 200

## 2022-07-24 MED ORDER — MONTELUKAST SODIUM 10 MG PO TABS: 10.0000 mg | ORAL_TABLET | Freq: Every day | ORAL | 0 refills | Status: DC

## 2022-07-24 MED ORDER — TRAMADOL HCL 50 MG PO TABS: 50.0000 mg | ORAL_TABLET | Freq: Three times a day (TID) | ORAL | 0 refills | Status: DC | PRN

## 2022-07-24 MED ORDER — PANTOPRAZOLE SODIUM 40 MG PO TBEC: 40.0000 mg | DELAYED_RELEASE_TABLET | Freq: Every day | ORAL | 0 refills | Status: DC

## 2022-07-24 MED ORDER — SENNA 8.6 MG PO TABS: 2.0000 | ORAL_TABLET | Freq: Every evening | ORAL | 0 refills | Status: DC | PRN

## 2022-07-24 NOTE — Discharge Instructions (Signed)
Daily dry dressing at the amputation site.

## 2022-07-24 NOTE — Progress Notes (Signed)
   07/24/22 0331  Provider Notification  Provider Name/Title Zebedee Iba, NP  Date Provider Notified 07/24/22  Time Provider Notified [phone]  Method of Notification Page  Notification Reason Critical result (Vanc Trough 27)  Test performed and critical result Vanc Trough 27  Date Critical Result Received 07/24/22  Time Critical Result Received 0331  Provider response  (Hold 0400  Vanc per pharmacist, Jeneen Rinks. New orders noted. Zebedee Iba NP notifed.)  Date of Provider Response 07/24/22  Time of Provider Response [phone]

## 2022-07-24 NOTE — Progress Notes (Signed)
Orthopedic Tech Progress Note Patient Details:  Sean Carter 21-Dec-1970 [id number]  Ortho Devices Type of Ortho Device: Darco shoe Ortho Device/Splint Location: RLE Ortho Device/Splint Interventions: Ordered, Application, Adjustment   Post Interventions Patient Tolerated: Well Instructions Provided: Care of device, Adjustment of device  [name] 07/24/2022, 2:47 PM

## 2022-07-24 NOTE — Discharge Summary (Signed)
Physician Discharge Summary  Sean Carter ZHY:[id number] DOB: 1971-03-28 DOA: 07/20/2022  PCP: Levin ErpJagadish, Mayuri, MD  Admit date: 07/20/2022 Discharge date: 07/24/2022  Admitted From: Home Disposition: Home  Recommendations for Outpatient Follow-up:  Follow up with PCP in 1-2 weeks Please obtain BMP/CBC in one week your next doctors visit.  Dressing recommendations and patient follow-up by vascular Pain medication with bowel regimen prescribed.  PPI, Singulair and albuterol prescribed as well for his cough.  If does not resolve, he is advised to follow-up with his PCP for further evaluation  Discharge Condition: Stable CODE STATUS: Full code Diet recommendation: Heart healthy  Brief/Interim Summary: 51 year old gentleman with significant peripheral vascular disease, history of gangrene of left great toe, hypertension, diabetes mellitus, status post amputation of left great toe presented to the emergency department complaining of right second toe swelling redness and blackening since 3 days ago. Patient also has a nonproductive cough and has had fever at home up to 103.Patient has gangrene in the left great toe and vascular surgery was consulted Dr. Randie Heinzain requested patient be admitted to [facility] for a formal vascular surgery consultation.  Patient has been started on broad-spectrum IV antibiotics after blood cultures obtained.  Patient was seen by vascular team, underwent right first and second partial ray amputation 10/4.  Patient did well postoperatively.  He is being discharged today in stable condition.     Assessment & Plan:  Principal Problem:   Necrotic toes ([name]) Active Problems:   Hypertension associated with diabetes ([name])   Hypokalemia   Diabetes mellitus type 2 with peripheral artery disease ([name])   Mixed hyperlipidemia   Diabetic retinopathy ([name])   Gangrene of toe ([name])   Leukocytosis       Assessment and Plan: * Necrotic toes ([name]), diabetic Sepsis due to Gangrene  of the right second toe and osteomyelitis Status post partial first and second ray amputation -Sepsis improved, x-ray showed osteomyelitis.  Seen by vascular, status post amputation.  Cleared by vascular for discharge.  No need for antibiotics   Hypertension associated with diabetes ([name]) - On losartan, HCTZ.     Diabetic retinopathy ([name]) - Outpatient follow-up with ophthalmology   Mixed hyperlipidemia - Lipitor   Diabetes mellitus type 2 with peripheral artery disease ([name]) - A1c- 6.8 - Resume home regimen   Hypokalemia - As needed repletion       Discharge Diagnoses:  Principal Problem:   Necrotic toes ([name]) Active Problems:   Hypertension associated with diabetes ([name])   Hypokalemia   Diabetes mellitus type 2 with peripheral artery disease ([name])   Mixed hyperlipidemia   Diabetic retinopathy ([name])   Gangrene of toe ([name])   Leukocytosis      Consultations: Vascular  Subjective: Doing well no complaints.  Wishes to go home.  Discharge Exam: Vitals:   07/24/22 0640 07/24/22 0823  BP: (!) 184/99 (!) 146/70  Pulse: 86 93  Resp: 18 16  Temp: 98.6 F (37 C) 98.1 F (36.7 C)  SpO2: 98% 95%   Vitals:   07/23/22 1639 07/23/22 2149 07/24/22 0640 07/24/22 0823  BP: 134/70 109/63 (!) 184/99 (!) 146/70  Pulse: 74 72 86 93  Resp: 18 18 18 16   Temp: 98.9 F (37.2 C) 98.9 F (37.2 C) 98.6 F (37 C) 98.1 F (36.7 C)  TempSrc: Oral Oral Oral Oral  SpO2: 99% 96% 98% 95%  Weight:      Height:        General: Pt is alert, awake, not  in acute distress Cardiovascular: RRR, S1/S2 +, no rubs, no gallops Respiratory: CTA bilaterally, no wheezing, no rhonchi Abdominal: Soft, NT, ND, bowel sounds + Extremities: no edema, no cyanosis Right foot amputation noted.  No active bleeding.  Staples in place  Discharge Instructions   Allergies as of 07/24/2022       Reactions   Scallops [shellfish Allergy] Anaphylaxis        Medication List     STOP taking  these medications    ibuprofen 600 MG tablet Commonly known as: ADVIL       TAKE these medications    albuterol 108 (90 Base) MCG/ACT inhaler Commonly known as: VENTOLIN HFA Inhale 2 puffs into the lungs every 6 (six) hours as needed for wheezing or shortness of breath.   atorvastatin 40 MG tablet Commonly known as: LIPITOR Take 1 tablet by mouth once daily   Basaglar KwikPen 100 UNIT/ML Inject 28 Units into the skin daily.   diphenhydrAMINE 25 MG tablet Commonly known as: BENADRYL Take 25 mg by mouth every 6 (six) hours as needed for itching, allergies or sleep.   fluticasone 50 MCG/ACT nasal spray Commonly known as: FLONASE Place 2 sprays into both nostrils daily.   hydrochlorothiazide 25 MG tablet Commonly known as: HYDRODIURIL Take 1 tablet by mouth once daily   insulin lispro 100 UNIT/ML injection Commonly known as: HUMALOG Inject 0.08 mLs (8 Units total) into the skin 3 (three) times daily before meals.   losartan 100 MG tablet Commonly known as: COZAAR Take 1 tablet (100 mg total) by mouth daily.   montelukast 10 MG tablet Commonly known as: SINGULAIR Take 1 tablet (10 mg total) by mouth at bedtime.   multivitamin tablet Take 1 tablet by mouth daily.   pantoprazole 40 MG tablet Commonly known as: PROTONIX Take 1 tablet (40 mg total) by mouth daily before breakfast.   ReliOn All-In-One Devi 1 each by Does not apply route 3 (three) times daily as needed.   ReliOn Lancet Devices 30G Misc 1 each by Does not apply route 3 (three) times daily as needed.   ReliOn Lancets Thin 26G Misc 1 each by Does not apply route 3 (three) times daily as needed.   senna 8.6 MG Tabs tablet Commonly known as: SENOKOT Take 2 tablets (17.2 mg total) by mouth at bedtime as needed for mild constipation or moderate constipation.   traMADol 50 MG tablet Commonly known as: ULTRAM Take 1 tablet (50 mg total) by mouth every 8 (eight) hours as needed for severe pain.    UltiCare Insulin Syringe 30G X 1/2" 0.3 ML Misc Generic drug: Insulin Syringe-Needle U-100 See admin instructions.               Durable Medical Equipment  (From admission, onward)           Start     Ordered   07/24/22 1012  For home use only DME Walker rolling  Once       Question Answer Comment  Walker: With 5 Inch Wheels   Patient needs a walker to treat with the following condition Weakness      07/24/22 1011            Follow-up Information     [facility] Follow up in 3 week(s).   Specialty: Vascular Surgery Why: Office will call to arrange your appt(s) (sent) Contact information: [address] [place] Daisy,  Mayuri, MD Follow up in 1 week(s).   Specialty: Family Medicine Contact information: [address] [place] [address] [phone]                Allergies  Allergen Reactions   Scallops [Shellfish Allergy] Anaphylaxis    You were cared for by a hospitalist during your hospital stay. If you have any questions about your discharge medications or the care you received while you were in the hospital after you are discharged, you can call the unit and asked to speak with the hospitalist on call if the hospitalist that took care of you is not available. Once you are discharged, your primary care physician will handle any further medical issues. Please note that no refills for any discharge medications will be authorized once you are discharged, as it is imperative that you return to your primary care physician (or establish a relationship with a primary care physician if you do not have one) for your aftercare needs so that they can reassess your need for medications and monitor your lab values.   Procedures/Studies: VAS [place] ABI WITH/WO TBI  Result Date: 07/21/2022  LOWER EXTREMITY DOPPLER STUDY Patient Name:  Sean Carter  Date of Exam:   07/21/2022  Medical Rec #: [id number]      Accession #:    [id number] Date of Birth: [birth year]     Patient Gender: M Patient Age:   69 years Exam Location:  [facility] Procedure:      VAS [place] ABI WITH/WO TBI Referring Phys: Lemar Livings --------------------------------------------------------------------------------  Indications: Peripheral artery disease. High Risk Factors: Hypertension, hyperlipidemia, Diabetes. Other Factors: Left great toe amputation.  Comparison Study: 06/01/2020 - Right: Resting right ankle-brachial index is                   within normal range. No                   evidence of significant right lower extremity arterial                   disease. The right                   toe-brachial index is normal.                    Left: Resting left ankle-brachial index is within normal                   range. No                   evidence of significant left lower extremity arterial disease.                   The left                   toe-brachial index is normal. Performing Technologist: Olen Cordial RVT  Examination Guidelines: A complete evaluation includes at minimum, Doppler waveform signals and systolic blood pressure reading at the level of bilateral brachial, anterior tibial, and posterior tibial arteries, when vessel segments are accessible. Bilateral testing is considered an integral part of a complete examination. Photoelectric Plethysmograph (PPG) waveforms and toe systolic pressure readings are included as required and additional duplex testing as needed. Limited examinations for reoccurring indications may be performed as noted.  ABI Findings: +---------+------------------+-----+-----------+--------+ Right    Rt Pressure (mmHg)IndexWaveform   Comment  +---------+------------------+-----+-----------+--------+ Brachial 131  triphasic           +---------+------------------+-----+-----------+--------+ PTA      134               1.00 multiphasic          +---------+------------------+-----+-----------+--------+ DP       170               1.27 multiphasic         +---------+------------------+-----+-----------+--------+ Great Toe90                0.67                     +---------+------------------+-----+-----------+--------+ +---------+------------------+-----+-----------+----------+ Left     Lt Pressure (mmHg)IndexWaveform   Comment    +---------+------------------+-----+-----------+----------+ Brachial 134                    triphasic             +---------+------------------+-----+-----------+----------+ PTA      147               1.10 multiphasic           +---------+------------------+-----+-----------+----------+ DP       175               1.31 multiphasic           +---------+------------------+-----+-----------+----------+ Great Toe                                  Amputation +---------+------------------+-----+-----------+----------+ +-------+-----------+-----------+------------+------------+ ABI/TBIToday's ABIToday's TBIPrevious ABIPrevious TBI +-------+-----------+-----------+------------+------------+ Right  1.27       0.67                                +-------+-----------+-----------+------------+------------+ Left   1.31                                           +-------+-----------+-----------+------------+------------+  Summary: Right: Resting right ankle-brachial index is within normal range. The right toe-brachial index is abnormal. Left: Resting left ankle-brachial index indicates noncompressible left lower extremity arteries. Unable to obtain TBI due to great toe amputation. *See table(s) above for measurements and observations.  Electronically signed by Waverly Ferrari MD on 07/21/2022 at 4:49:58 PM.    Final    DG Chest Portable 1 View  Result Date: 07/20/2022 CLINICAL DATA:  Cough since Friday EXAM: PORTABLE CHEST 1 VIEW COMPARISON:  Portable exam 0932 hours compared to  06/04/2020 FINDINGS: Upper normal heart size. Mediastinal contours and pulmonary vascularity normal. Minimal RIGHT basilar atelectasis. Lungs otherwise clear. No pulmonary infiltrate, pleural effusion, or pneumothorax. Osseous structures unremarkable. IMPRESSION: Minimal RIGHT basilar atelectasis. Electronically Signed   By: Ulyses Southward M.D.   On: 07/20/2022 10:01   DG Foot Complete Right  Result Date: 07/20/2022 CLINICAL DATA:  RIGHT second toe injury, necrotic, diabetes mellitus EXAM: RIGHT FOOT COMPLETE - 3+ VIEW COMPARISON:  None Available. FINDINGS: Dressing artifacts and marked soft tissue swelling at second toe. Osseous demineralization. Joint spaces preserved. Unable to exclude foci of soft tissue gas at second toe due to dressing artifacts. Bone destruction at lateral aspect base of proximal phalanx second toe consistent with osteomyelitis. No acute fracture, dislocation, or additional bone destruction. Plantar calcaneal spur. Additional spur formation  at dorsal margin of talonavicular joint. IMPRESSION: Extensive soft tissue swelling and dressing artifacts at RIGHT second toe with evidence of osteomyelitis at the base of the distal phalanx. Electronically Signed   By: Ulyses Southward M.D.   On: 07/20/2022 10:00     The results of significant diagnostics from this hospitalization (including imaging, microbiology, ancillary and laboratory) are listed below for reference.     Microbiology: Recent Results (from the past 240 hour(s))  Blood culture (routine x 2)     Status: None (Preliminary result)   Collection Time: 07/20/22 10:58 AM   Specimen: BLOOD  Result Value Ref Range Status   Specimen Description BLOOD LEFT ASSIST CONTROL  Final   Special Requests   Final    BOTTLES DRAWN AEROBIC AND ANAEROBIC Blood Culture results may not be optimal due to an excessive volume of blood received in culture bottles   Culture   Final    NO GROWTH 4 DAYS Performed at [facility], [address]  [place], [place] [address]    Report Status PENDING  Incomplete  Blood culture (routine x 2)     Status: None (Preliminary result)   Collection Time: 07/20/22 10:58 AM   Specimen: BLOOD  Result Value Ref Range Status   Specimen Description BLOOD RIGHT ASSIST CONTROL  Final   Special Requests   Final    BOTTLES DRAWN AEROBIC AND ANAEROBIC Blood Culture adequate volume   Culture   Final    NO GROWTH 4 DAYS Performed at [facility], [address], [place], [place] [address]    Report Status PENDING  Incomplete  Surgical pcr screen     Status: Abnormal   Collection Time: 07/21/22 10:33 PM   Specimen: Nasal Mucosa; Nasal Swab  Result Value Ref Range Status   MRSA, PCR NEGATIVE NEGATIVE Final   Staphylococcus aureus POSITIVE (A) NEGATIVE Final    Comment: (NOTE) The Xpert SA Assay (FDA approved for NASAL specimens in patients 62 years of age and older), is one component of a comprehensive surveillance program. It is not intended to diagnose infection nor to guide or monitor treatment. Performed at [facility], [address] [place], [place] [address]   Aerobic/Anaerobic Culture w Gram Stain (surgical/deep wound)     Status: None (Preliminary result)   Collection Time: 07/22/22  3:53 PM   Specimen: PATH Digit amputation; Tissue  Result Value Ref Range Status   Specimen Description TISSUE  Final   Special Requests RIGHT 1ST AND 2ND TOES PT ON UNASYN  Final   Gram Stain   Final    ABUNDANT WBC PRESENT,BOTH PMN AND MONONUCLEAR FEW GRAM NEGATIVE RODS RARE GRAM POSITIVE COCCI IN PAIRS    Culture   Final    ABUNDANT GRAM NEGATIVE RODS CULTURE REINCUBATED FOR BETTER GROWTH Performed at [facility], [address], [place], [place] [address]    Report Status PENDING  Incomplete     Labs: BNP (last 3 results) No results for input(s): "BNP" in the last 8760 hours. Basic Metabolic Panel: Recent Labs  Lab 07/20/22 1058 07/20/22 1235 07/22/22 0621 07/23/22 0240  07/24/22 0216  NA 135  --  139 131* 139  K 3.4*  --  3.2* 4.2 3.8  CL 96*  --  96* 94* 101  CO2 30  --  GLUCOSE 152*  --  150* 328* 105*  BUN 23*  --  21* 18 20  CREATININE 1.00 0.97 1.01 1.25*  1.18  CALCIUM 8.4*  --  8.7* 8.5* 8.6*  MG  --   --  2.0 1.8 1.8   Liver Function Tests: Recent Labs  Lab 07/22/22 0621 07/23/22 0240 07/24/22 0216  AST 47* 40 53*  ALT 50* 48* 64*  ALKPHOS 114 127* 111  BILITOT 0.7 0.8 0.4  PROT 6.2* 6.7 6.0*  ALBUMIN 2.4* 2.6* 2.4*   No results for input(s): "LIPASE", "AMYLASE" in the last 168 hours. No results for input(s): "AMMONIA" in the last 168 hours. CBC: Recent Labs  Lab 07/20/22 1058 07/20/22 1235 07/21/22 0240 07/22/22 0621 07/23/22 0240  WBC 15.1* 14.1* 16.2* 13.6* 16.1*  NEUTROABS 11.8*  --  11.7* 9.6* 14.1*  HGB 10.9* 10.4* 9.6* 9.1* 10.5*  HCT 32.6* 30.9* 28.0* 26.6* 30.1*  MCV 86.9 86.6 85.6 87.5 85.8  PLT 351 344 299 295 356   Cardiac Enzymes: No results for input(s): "CKTOTAL", "CKMB", "CKMBINDEX", "TROPONINI" in the last 168 hours. BNP: Invalid input(s): "POCBNP" CBG: Recent Labs  Lab 07/23/22 1209 07/23/22 1641 07/23/22 2151 07/24/22 0824 07/24/22 1209  GLUCAP 229* 214* 112* 109* 158*   D-Dimer No results for input(s): "DDIMER" in the last 72 hours. Hgb A1c No results for input(s): "HGBA1C" in the last 72 hours. Lipid Profile No results for input(s): "CHOL", "HDL", "LDLCALC", "TRIG", "CHOLHDL", "LDLDIRECT" in the last 72 hours. Thyroid function studies No results for input(s): "TSH", "T4TOTAL", "T3FREE", "THYROIDAB" in the last 72 hours.  Invalid input(s): "FREET3" Anemia work up No results for input(s): "VITAMINB12", "FOLATE", "FERRITIN", "TIBC", "IRON", "RETICCTPCT" in the last 72 hours. Urinalysis    Component Value Date/Time   COLORURINE YELLOW 05/22/2022 1139   APPEARANCEUR CLEAR 05/22/2022 1139   LABSPEC 1.016 05/22/2022 1139   PHURINE 5.0 05/22/2022 1139   GLUCOSEU NEGATIVE  05/22/2022 1139   HGBUR NEGATIVE 05/22/2022 1139   BILIRUBINUR NEGATIVE 05/22/2022 1139   KETONESUR NEGATIVE 05/22/2022 1139   PROTEINUR NEGATIVE 05/22/2022 1139   NITRITE NEGATIVE 05/22/2022 1139   LEUKOCYTESUR NEGATIVE 05/22/2022 1139   Sepsis Labs Recent Labs  Lab 07/20/22 1235 07/21/22 0240 07/22/22 0621 07/23/22 0240  WBC 14.1* 16.2* 13.6* 16.1*   Microbiology Recent Results (from the past 240 hour(s))  Blood culture (routine x 2)     Status: None (Preliminary result)   Collection Time: 07/20/22 10:58 AM   Specimen: BLOOD  Result Value Ref Range Status   Specimen Description BLOOD LEFT ASSIST CONTROL  Final   Special Requests   Final    BOTTLES DRAWN AEROBIC AND ANAEROBIC Blood Culture results may not be optimal due to an excessive volume of blood received in culture bottles   Culture   Final    NO GROWTH 4 DAYS Performed at [facility], [address], [place], [place] [address]    Report Status PENDING  Incomplete  Blood culture (routine x 2)     Status: None (Preliminary result)   Collection Time: 07/20/22 10:58 AM   Specimen: BLOOD  Result Value Ref Range Status   Specimen Description BLOOD RIGHT ASSIST CONTROL  Final   Special Requests   Final    BOTTLES DRAWN AEROBIC AND ANAEROBIC Blood Culture adequate volume   Culture   Final    NO GROWTH 4 DAYS Performed at [facility], [address], [place], [place] [address]    Report Status PENDING  Incomplete  Surgical pcr screen     Status: Abnormal   Collection Time: 07/21/22 10:33 PM   Specimen: Nasal Mucosa; Nasal Swab  Result Value Ref Range Status   MRSA, PCR NEGATIVE NEGATIVE Final   Staphylococcus aureus POSITIVE (A) NEGATIVE Final    Comment: (NOTE) The Xpert SA Assay (FDA approved for NASAL specimens in patients 94 years of age and older), is one component of a comprehensive surveillance program. It is not intended to diagnose infection nor to guide or monitor treatment. Performed at [facility], [address], [place], [place] [address]   Aerobic/Anaerobic Culture w Gram Stain (surgical/deep wound)     Status: None (Preliminary result)   Collection Time: 07/22/22  3:53 PM   Specimen: PATH Digit amputation; Tissue  Result Value Ref Range Status   Specimen Description TISSUE  Final   Special Requests RIGHT 1ST AND 2ND TOES PT ON UNASYN  Final   Gram Stain   Final    ABUNDANT WBC PRESENT,BOTH PMN AND MONONUCLEAR FEW GRAM NEGATIVE RODS RARE GRAM POSITIVE COCCI IN PAIRS    Culture   Final    ABUNDANT GRAM NEGATIVE RODS CULTURE REINCUBATED FOR BETTER GROWTH Performed at [facility], [address], [place], [place] [address]    Report Status PENDING  Incomplete     Time coordinating discharge:  I have spent 35 minutes face to face with the patient and on the ward discussing the patients care, assessment, plan and disposition with other care givers. >50% of the time was devoted counseling the patient about the risks and benefits of treatment/Discharge disposition and coordinating care.   SIGNED:   Dimple Nanas, MD  [facility] 07/24/2022, 12:24 PM   If 7PM-7AM, please contact night-coverage

## 2022-07-24 NOTE — Progress Notes (Signed)
Pharmacy Antibiotic Note  Sean Carter is a 51 y.o. male admitted on 07/20/2022 with cellulitis and wound infection .  Pharmacy has been consulted for vancomycin and Unasyn dosing.  Patient underwent right 1st and 2nd partial ray amputation with vascular surgery on 10/4. Operative cultures were obtained.  10/6 AM update:  Vancomycin AUC supra-therapeutic at 663 Scr 1.01>>1.25>>1.18   Plan: Dec vancomycin to 1000 mg IV q12h >>New estimated AUC: 531 Continue Unasyn 3g Q6H Awaiting final cultures and whether source control was achieved with amputation to determine duration of therapy.  Height: 5\' 8"  (172.7 cm) Weight: 93.7 kg (206 lb 9.1 oz) IBW/kg (Calculated) : 68.4  Temp (24hrs), Avg:98.5 F (36.9 C), Min:97.9 F (36.6 C), Max:98.9 F (37.2 C)  Recent Labs  Lab 07/20/22 1058 07/20/22 1235 07/21/22 0240 07/21/22 1028 07/22/22 0621 07/23/22 0240 07/23/22 2004 07/24/22 0216  WBC 15.1* 14.1* 16.2*  --  13.6* 16.1*  --   --   CREATININE 1.00 0.97  --   --  1.01 1.25*  --  1.18  LATICACIDVEN 1.7 1.2  --  1.1  --   --   --   --   VANCOTROUGH  --   --   --   --   --   --   --  27*  VANCOPEAK  --   --   --   --   --   --  30  --      Estimated Creatinine Clearance: 82.2 mL/min (by C-G formula based on SCr of 1.18 mg/dL).    Allergies  Allergen Reactions   Scallops [Shellfish Allergy] Anaphylaxis    Antimicrobials this admission: Vanco 10/2 >> Unasyn 10/2 >>  Microbiology results: 10/2 BCx: NGTD 10/4 Amputation: GNR, GPC in pairs  Narda Bonds, PharmD, [place] Pharmacist Phone: [phone]

## 2022-07-24 NOTE — Progress Notes (Signed)
Discharge instructions reviewed with pt. Copy of instructions  given to pt. Scripts sent to pt's pharmacy, pt informed to pick up. Ortho tech notified to bring [place] for pt's right foot for discharge.  Pt's primary nurse in room changing dsg to pt's right foot and educating pt/wife on incision/wound care.   Pt to discharge home with wife, will d/c via wheelchair with belongings.

## 2022-07-24 NOTE — TOC Initial Note (Signed)
Transition of Care [facility]) - Initial/Assessment Note    Patient Details  Name: Sean Carter MRN: [id number] Date of Birth: Apr 16, 1971  Transition of Care [facility]) CM/SW Contact:    Marilu Favre, RN Phone Number: 07/24/2022, 10:22 AM  Clinical Narrative:                 Spoke to patient at bedside. Patient for discharge to home today.   PT recommending a walker . Patient in agreement. Same ordered with Mardene Celeste with [facility] . Mardene Celeste aware patient discharging today.    Nursing will provide wound care education and dressing supplies prior to discharge.   Patient voiced understanding to all of above.   Expected Discharge Plan: Home/Self Care Barriers to Discharge: No Barriers Identified   Patient Goals and CMS Choice Patient states their goals for this hospitalization and ongoing recovery are:: to return to home CMS Medicare.gov Compare Post Acute Care list provided to:: Patient Choice offered to / list presented to : Patient  Expected Discharge Plan and Services Expected Discharge Plan: Home/Self Care   Discharge Planning Services: CM Consult Post Acute Care Choice: Durable Medical Equipment Living arrangements for the past 2 months: Single Family Home Expected Discharge Date: 07/24/22               DME Arranged: Gilford Rile rolling DME Agency: [employer] Date DME Agency Contacted: 07/24/22 Time DME Agency Contacted: 66 Representative spoke with at DME Agency: [place] Arranged: NA          Prior Living Arrangements/Services Living arrangements for the past 2 months: [place] Lives with:: Significant Other Patient language and need for interpreter reviewed:: Yes Do you feel safe going back to the place where you live?: Yes      Need for Family Participation in Patient Care: Yes (Comment) Care giver support system in place?: Yes (comment)   Criminal Activity/Legal Involvement Pertinent to Current Situation/Hospitalization: No - Comment as  needed  Activities of Daily Living Home Assistive Devices/Equipment: None ADL Screening (condition at time of admission) Patient's cognitive ability adequate to safely complete daily activities?: Yes Is the patient deaf or have difficulty hearing?: No Does the patient have difficulty seeing, even when wearing glasses/contacts?: No Does the patient have difficulty concentrating, remembering, or making decisions?: No Patient able to express need for assistance with ADLs?: Yes Does the patient have difficulty dressing or bathing?: No Independently performs ADLs?: Yes (appropriate for developmental age) Does the patient have difficulty walking or climbing stairs?: No Weakness of Legs: None Weakness of Arms/Hands: None  Permission Sought/Granted   Permission granted to share information with : No              Emotional Assessment   Attitude/Demeanor/Rapport: Engaged Affect (typically observed): Accepting Orientation: : Oriented to Situation, Oriented to  Time, Oriented to Place, Oriented to Self Alcohol / Substance Use: Not Applicable Psych Involvement: No (comment)  Admission diagnosis:  Peripheral vascular disease ([place]) [I73.9] Diabetic osteomyelitis ([place]) [E11.69, M86.9] Necrotic toes ([place]) [I96] Patient Active Problem List   Diagnosis Date Noted   Necrotic toes ([place]) 07/20/2022   Gangrene of toe ([place]) 07/20/2022   Leukocytosis 07/20/2022   Healthcare maintenance 06/29/2022   Diabetic retinopathy ([place]) 06/29/2022   Mixed hyperlipidemia 11/20/2021   Blurry vision 07/10/2020   Sepsis due to Streptococcus, group B ([place]) 06/20/2020   Superficial vein thrombosis 06/06/2020   Hypertension associated with diabetes ([place]) 06/06/2020   Diabetes mellitus type 2 with peripheral artery disease ([place])  06/06/2020   Gangrene of toe of left foot ([name])    Amputation of left great toe ([name])    Hypokalemia 05/30/2020   PCP:  Levin Erp, MD Pharmacy:   [facility] [address], [place] - [address] [place] [address] Phone: [phone] Fax: [phone]  [facility] - [place], [place] - [address] [place] [address] Phone: [phone] Fax: [phone]     Social Determinants of Health (SDOH) Interventions Food Insecurity Interventions: Intervention Not Indicated Transportation Interventions: Intervention Not Indicated  Readmission Risk Interventions     No data to display

## 2022-07-24 NOTE — Progress Notes (Addendum)
  Progress Note    07/24/2022 7:41 AM 2 Days Post-Op  Subjective:  less pain in R foot    Vitals:   07/23/22 2149 07/24/22 0640  BP: 109/63 (!) 184/99  Pulse: 72 86  Resp: 18 18  Temp: 98.9 F (37.2 C) 98.6 F (37 C)  SpO2: 96% 98%    Physical Exam: Lungs:  nonlabored Incisions:  R 1st and 2nd toe amp incision intact with staples and nylon sutures Extremities:  Palpable R DP pulse    CBC    Component Value Date/Time   WBC 16.1 (H) 07/23/2022 0240   RBC 3.51 (L) 07/23/2022 0240   HGB 10.5 (L) 07/23/2022 0240   HCT 30.1 (L) 07/23/2022 0240   PLT 356 07/23/2022 0240   MCV 85.8 07/23/2022 0240   MCH 29.9 07/23/2022 0240   MCHC 34.9 07/23/2022 0240   RDW 12.2 07/23/2022 0240   LYMPHSABS 1.2 07/23/2022 0240   MONOABS 0.7 07/23/2022 0240   EOSABS 0.0 07/23/2022 0240   BASOSABS 0.0 07/23/2022 0240    BMET    Component Value Date/Time   NA 139 07/24/2022 0216   NA 142 11/17/2021 1732   K 3.8 07/24/2022 0216   CL 101 07/24/2022 0216   CO2 28 07/24/2022 0216   GLUCOSE 105 (H) 07/24/2022 0216   BUN 20 07/24/2022 0216   BUN 37 (H) 11/17/2021 1732   CREATININE 1.18 07/24/2022 0216   CREATININE 0.78 07/10/2020 0910   CALCIUM 8.6 (L) 07/24/2022 0216   GFRNONAA >60 07/24/2022 0216   GFRNONAA 106 07/10/2020 0910   GFRAA 123 07/10/2020 0910    INR    Component Value Date/Time   INR 1.1 05/30/2020 1049     Intake/Output Summary (Last 24 hours) at 07/24/2022 0741 Last data filed at 07/24/2022 4010 Gross per 24 hour  Intake 2350.96 ml  Output 2150 ml  Net 200.96 ml     Assessment/Plan:  51 y.o. male is 2 days post op, s/p: R 1st and 2nd partial ray amputation   -Dressing on R foot taken down with some dry blood. Amputation site intact with staples and nylon sutures. No signs of infection -Patient safe for discharge from a vascular standpoint -He can do daily dry gauze dressing changes to the amputation site -He can follow up with our office in 2-3 weeks  for wound check -Continue ambulating heel weight bearing only  Vicente Serene, PA-C [facility] [phone] 07/24/2022 7:41 AM  I have independently interviewed and examined patient and agree with PA assessment and plan above.   Soua Lenk C. Donzetta Matters, MD [facility] [place] Office: [phone] Pager: [phone]

## 2022-07-25 LAB — CULTURE, BLOOD (ROUTINE X 2)
Culture: NO GROWTH
Culture: NO GROWTH
Special Requests: ADEQUATE

## 2022-07-25 LAB — AEROBIC/ANAEROBIC CULTURE W GRAM STAIN (SURGICAL/DEEP WOUND)

## 2022-07-28 ENCOUNTER — Ambulatory Visit: Payer: Self-pay | Admitting: Vascular Surgery

## 2022-07-28 ENCOUNTER — Encounter (HOSPITAL_COMMUNITY): Payer: Self-pay

## 2022-08-09 ENCOUNTER — Other Ambulatory Visit: Payer: Self-pay | Admitting: Student

## 2022-08-09 DIAGNOSIS — E1151 Type 2 diabetes mellitus with diabetic peripheral angiopathy without gangrene: Secondary | ICD-10-CM

## 2022-08-09 DIAGNOSIS — E1159 Type 2 diabetes mellitus with other circulatory complications: Secondary | ICD-10-CM

## 2022-08-09 DIAGNOSIS — I152 Hypertension secondary to endocrine disorders: Secondary | ICD-10-CM

## 2022-08-09 DIAGNOSIS — E785 Hyperlipidemia, unspecified: Secondary | ICD-10-CM

## 2022-08-11 ENCOUNTER — Other Ambulatory Visit: Payer: Self-pay

## 2022-08-11 NOTE — Progress Notes (Unsigned)
POST OPERATIVE OFFICE NOTE    CC:  F/u for surgery  HPI:  This is a 51 y.o. male who is s/p right 1st and 2nd partial ray amputation on 07/22/2022 by Dr. Stanford Breed.    He has a PMH significant for DM2, PAD, hypertension.  He denies any further fevers or chills since toe amputation.  He is heel weightbearing only and is walking using the Darco shoe.  Admittedly he has not cleansed his foot since surgery.  He denies any pain to the surgical site.   Surgical history also significant for ray amputation of left great toe with wound VAC placement on 05/31/2020 by Dr. Carlis Abbott.  It has since healed.  He is however interested in a shoe insert to improve balance and ambulation.   He denies any rest pain or claudication.  He denies any other wounds on his lower extremities.   Allergies  Allergen Reactions   Scallops [Shellfish Allergy] Anaphylaxis    Current Outpatient Medications  Medication Sig Dispense Refill   albuterol (VENTOLIN HFA) 108 (90 Base) MCG/ACT inhaler Inhale 2 puffs into the lungs every 6 (six) hours as needed for wheezing or shortness of breath. 8 g 0   atorvastatin (LIPITOR) 40 MG tablet Take 1 tablet by mouth once daily 60 tablet 0   Blood Gluc Meter Disp-Strips (RELION ALL-IN-ONE) DEVI 1 each by Does not apply route 3 (three) times daily as needed. 1 each 3   diphenhydrAMINE (BENADRYL) 25 MG tablet Take 25 mg by mouth every 6 (six) hours as needed for itching, allergies or sleep.     hydrochlorothiazide (HYDRODIURIL) 25 MG tablet Take 1 tablet by mouth once daily 60 tablet 0   Insulin Glargine (BASAGLAR KWIKPEN) 100 UNIT/ML Inject 28 Units into the skin daily. 15 mL 2   insulin lispro (HUMALOG) 100 UNIT/ML injection Inject 0.08 mLs (8 Units total) into the skin 3 (three) times daily before meals. 10 mL 2   losartan (COZAAR) 100 MG tablet Take 1 tablet by mouth once daily 60 tablet 0   montelukast (SINGULAIR) 10 MG tablet Take 1 tablet (10 mg total) by mouth at bedtime. 30 tablet 0    Multiple Vitamin (MULTIVITAMIN) tablet Take 1 tablet by mouth daily.     pantoprazole (PROTONIX) 40 MG tablet Take 1 tablet (40 mg total) by mouth daily before breakfast. 30 tablet 0   ReliOn Lancet Devices 30G MISC 1 each by Does not apply route 3 (three) times daily as needed. 1 each 3   ReliOn Lancets Thin 26G MISC 1 each by Does not apply route 3 (three) times daily as needed. 1 each 3   traMADol (ULTRAM) 50 MG tablet Take 1 tablet (50 mg total) by mouth every 8 (eight) hours as needed for severe pain. 20 tablet 0   ULTICARE INSULIN SYRINGE 30G X 1/2" 0.3 ML MISC See admin instructions.      fluticasone (FLONASE) 50 MCG/ACT nasal spray Place 2 sprays into both nostrils daily. (Patient not taking: Reported on 08/12/2022) 16 g 6   senna (SENOKOT) 8.6 MG TABS tablet Take 2 tablets (17.2 mg total) by mouth at bedtime as needed for mild constipation or moderate constipation. (Patient not taking: Reported on 08/12/2022) 30 tablet 0   No current facility-administered medications for this visit.     ROS:  See HPI  Physical Exam:  Today's Vitals   08/12/22 0921  BP: (!) 200/103  Pulse: 69  Resp: 20  Temp: 98.1 F (36.7 C)  TempSrc:  Temporal  SpO2: 100%  Weight: 202 lb (91.6 kg)  Height: 5\' 8"  (1.727 m)   Body mass index is 30.71 kg/m.  Incision: Necrotic tissue and fibrinous exudate debrided back to bleeding healthy tissue Extremities: Palpable right ATA pulse      Assessment/Plan:  This is a 51 y.o. male who is s/p: right 1st and 2nd partial ray amputation on 07/22/2022 by Dr. 09/21/2022.    -Right foot well-perfused with palpable ATA pulse -Necrotic tissue and fibrinous exudate debrided in the office today; some staples also removed. -Recommend cleansing the amputation site twice daily with Dial soap and water.  Pat to dry.  Ok to leave open to air while resting at home.  Continue darco shoe for heel weightbearing. -2-week Keflex prescription provided -Prescription provided  for left shoe insert with previous great toe amputation that has since healed.  He has trouble with balance and ambulation especially now that right great toe has been amputated.  He would greatly benefit from shoe insert for his left foot. -Wound check in 1 week    Lenell Antu,  [facility] [phone]   Clinic MD:  [phone]

## 2022-08-11 NOTE — Telephone Encounter (Signed)
Patient calls nurse line requesting a refill on Albuterol inhaler.   Patient reports this was given to him at hospital discharge. Patient was not on our service. He is unsure if he is to continue this medication or not.   Patient advised to make a hospital FU apt with PCP.   Will forward to PCP.

## 2022-08-12 ENCOUNTER — Ambulatory Visit (INDEPENDENT_AMBULATORY_CARE_PROVIDER_SITE_OTHER): Payer: 59 | Admitting: Physician Assistant

## 2022-08-12 VITALS — BP 200/103 | HR 69 | Temp 98.1°F | Resp 20 | Ht 68.0 in | Wt 202.0 lb

## 2022-08-12 DIAGNOSIS — I739 Peripheral vascular disease, unspecified: Secondary | ICD-10-CM

## 2022-08-12 MED ORDER — CEPHALEXIN 500 MG PO CAPS: 500.0000 mg | ORAL_CAPSULE | Freq: Two times a day (BID) | ORAL | 0 refills | Status: DC

## 2022-08-14 ENCOUNTER — Telehealth: Payer: Self-pay

## 2022-08-14 NOTE — Telephone Encounter (Signed)
Pt called requesting a Tramadol refill and inquiring about forms.  Reviewed pt's chart, returned call for clarification, two identifiers used. According to the last office visit on 10/25, the pt stated he had no pain. Pt stated that he only feels pain at night when he's wearing his boot. Instructed him to take Tylenol and Advil before bed, but he would not be getting a Tramadol refill. Informed him that [employer] was working on his paperwork and would be contacting him soon. Confirmed understanding.

## 2022-08-14 NOTE — Telephone Encounter (Signed)
Patient calls nurse line requesting a refill on Tramadol.   Patient reports this was given to him after surgery. Patient reports he is still experiencing pain and discomfort. Denies signs of infection.   Patient advised he would more than likely need to be seen before we can issue a refill.   Patient became agitated stating he has an apt already with PCP on 11/10. Patient reports he does not drive and relies on his wife. He reports she will be out of town all next week.   Will forward to PCP.

## 2022-08-17 ENCOUNTER — Encounter: Payer: Self-pay | Admitting: Gastroenterology

## 2022-08-18 ENCOUNTER — Telehealth: Payer: Self-pay

## 2022-08-18 NOTE — Telephone Encounter (Signed)
Patient calls nurse line regarding FMLA paperwork. He previously emailed paperwork, however, paperwork had another employee's name.   Advised that he would need to reach out to HR to obtain new copy of paperwork with his information. He will call back once this has been taken care of.   Talbot Grumbling, RN

## 2022-08-19 ENCOUNTER — Ambulatory Visit (INDEPENDENT_AMBULATORY_CARE_PROVIDER_SITE_OTHER): Payer: 59 | Admitting: Physician Assistant

## 2022-08-19 VITALS — BP 198/96 | HR 72 | Temp 97.7°F | Ht 67.0 in | Wt 205.7 lb

## 2022-08-19 DIAGNOSIS — I739 Peripheral vascular disease, unspecified: Secondary | ICD-10-CM

## 2022-08-19 NOTE — Progress Notes (Addendum)
POST OPERATIVE OFFICE NOTE    CC:  F/u for surgery  HPI:  This is a 51 y.o. male who is s/p right 1st and 2nd partial ray amputation on 07/22/2022 by Dr. Stanford Breed.  He has been heel weight bearing in a post op shoe.  He believes the wound is improving since last visit.  He is having pain at night.  Surgical history also significant for ray amputation of left great toe with wound VAC placement on 05/31/2020 by Dr. Carlis Abbott.  It has since healed.  He is however interested in a shoe insert to improve balance and ambulation.      Allergies  Allergen Reactions   Scallops [Shellfish Allergy] Anaphylaxis    Current Outpatient Medications  Medication Sig Dispense Refill   albuterol (VENTOLIN HFA) 108 (90 Base) MCG/ACT inhaler Inhale 2 puffs into the lungs every 6 (six) hours as needed for wheezing or shortness of breath. 8 g 0   atorvastatin (LIPITOR) 40 MG tablet Take 1 tablet by mouth once daily 60 tablet 0   Blood Gluc Meter Disp-Strips (RELION ALL-IN-ONE) DEVI 1 each by Does not apply route 3 (three) times daily as needed. 1 each 3   cephALEXin (KEFLEX) 500 MG capsule Take 1 capsule (500 mg total) by mouth 2 (two) times daily. 28 capsule 0   diphenhydrAMINE (BENADRYL) 25 MG tablet Take 25 mg by mouth every 6 (six) hours as needed for itching, allergies or sleep.     fluticasone (FLONASE) 50 MCG/ACT nasal spray Place 2 sprays into both nostrils daily. 16 g 6   hydrochlorothiazide (HYDRODIURIL) 25 MG tablet Take 1 tablet by mouth once daily 60 tablet 0   Insulin Glargine (BASAGLAR KWIKPEN) 100 UNIT/ML Inject 28 Units into the skin daily. 15 mL 2   insulin lispro (HUMALOG) 100 UNIT/ML injection Inject 0.08 mLs (8 Units total) into the skin 3 (three) times daily before meals. 10 mL 2   losartan (COZAAR) 100 MG tablet Take 1 tablet by mouth once daily 60 tablet 0   montelukast (SINGULAIR) 10 MG tablet Take 1 tablet (10 mg total) by mouth at bedtime. 30 tablet 0   Multiple Vitamin (MULTIVITAMIN)  tablet Take 1 tablet by mouth daily.     pantoprazole (PROTONIX) 40 MG tablet Take 1 tablet (40 mg total) by mouth daily before breakfast. 30 tablet 0   ReliOn Lancet Devices 30G MISC 1 each by Does not apply route 3 (three) times daily as needed. 1 each 3   ReliOn Lancets Thin 26G MISC 1 each by Does not apply route 3 (three) times daily as needed. 1 each 3   senna (SENOKOT) 8.6 MG TABS tablet Take 2 tablets (17.2 mg total) by mouth at bedtime as needed for mild constipation or moderate constipation. 30 tablet 0   traMADol (ULTRAM) 50 MG tablet Take 1 tablet (50 mg total) by mouth every 8 (eight) hours as needed for severe pain. 20 tablet 0   ULTICARE INSULIN SYRINGE 30G X 1/2" 0.3 ML MISC See admin instructions.      No current facility-administered medications for this visit.     ROS:  See HPI  Physical Exam:  Today's Vitals   08/19/22 0828  BP: (!) 198/96  Pulse: 72  Temp: 97.7 F (36.5 C)  TempSrc: Temporal  SpO2: 100%  Weight: 205 lb 11.2 oz (93.3 kg)  Height: 5\' 7"  (1.702 m)  PainSc: 7    Body mass index is 32.22 kg/m.  Incision:  R foot wound  pictured below Extremities:  palpable R DP pulse Neuro: A&O     Assessment/Plan:  This is a 51 y.o. male who is s/p: R 1st and 2nd toe ray amp  - R foot well perfused with palpable DP pulse - Wound debrided some in the office down to some bleeding tissue.  Wound care will involve wet to dry packing with 2x2 gauze followed by kerlex wrap.  This will be changed BID.  He will also cleanse the foot with soap and water and pat to dry. -Continue keflex to completion -Tramadol prescription provided -Recheck in 2 weeks   Sean Carter, [facility] [phone]   Clinic MD:  Randie Heinz

## 2022-08-19 NOTE — Telephone Encounter (Signed)
It appears patient has an apt with vascular surgery today.

## 2022-08-20 ENCOUNTER — Telehealth: Payer: Self-pay

## 2022-08-20 MED ORDER — TRAMADOL HCL 50 MG PO TABS: 50.0000 mg | ORAL_TABLET | Freq: Four times a day (QID) | ORAL | 0 refills | Status: DC | PRN

## 2022-08-20 NOTE — Telephone Encounter (Addendum)
-----   Message from Dagoberto Ligas, PA-C sent at 08/20/2022 10:48 AM EDT ----- Just sent in tramadol Thanks

## 2022-08-20 NOTE — Addendum Note (Signed)
Addended byDagoberto Ligas on: 08/20/2022 10:50 AM   Modules accepted: Orders

## 2022-08-20 NOTE — Telephone Encounter (Signed)
Pt called stating that at his visit on 11/1, Matt, [place] told him he would send in a refill for his Tramadol and it hadn't been sent in yet.  Reviewed pt's chart, sent Matt, [place] a text page, called pt to inform him of refill sent, two identifiers used. Confirmed understanding.

## 2022-08-28 ENCOUNTER — Ambulatory Visit: Payer: 59 | Admitting: Student

## 2022-08-28 ENCOUNTER — Telehealth: Payer: Self-pay

## 2022-08-28 ENCOUNTER — Encounter: Payer: Self-pay | Admitting: Student

## 2022-08-28 VITALS — BP 190/106 | HR 82 | Ht 67.0 in | Wt 207.2 lb

## 2022-08-28 DIAGNOSIS — Z87898 Personal history of other specified conditions: Secondary | ICD-10-CM | POA: Insufficient documentation

## 2022-08-28 DIAGNOSIS — I152 Hypertension secondary to endocrine disorders: Secondary | ICD-10-CM | POA: Diagnosis not present

## 2022-08-28 DIAGNOSIS — S98112A Complete traumatic amputation of left great toe, initial encounter: Secondary | ICD-10-CM

## 2022-08-28 DIAGNOSIS — E1159 Type 2 diabetes mellitus with other circulatory complications: Secondary | ICD-10-CM

## 2022-08-28 DIAGNOSIS — R198 Other specified symptoms and signs involving the digestive system and abdomen: Secondary | ICD-10-CM | POA: Insufficient documentation

## 2022-08-28 MED ORDER — FAMOTIDINE 40 MG PO TABS: 40.0000 mg | ORAL_TABLET | Freq: Every day | ORAL | 0 refills | Status: DC

## 2022-08-28 NOTE — Assessment & Plan Note (Signed)
Patient says he was started on albuterol and Singulair in the hospital for wheezing while there.  He says it was worst after his surgery where he had a lot of coughing.  I discussed I would like to evaluate with formal PFTs before prescribing albuterol and Singulair for him.  No history of smoking doubt COPD. -Pharmacy referral for PFT evaluations

## 2022-08-28 NOTE — Assessment & Plan Note (Signed)
Patient has significant hypertension while in the office. 162/118>190/106 while in the office.  Has been adherent to his medications and took them this morning.  Says his blood pressures are 130s over 90s at home -Continue losartan 100 mg daily -Continue HCTZ 25 mg daily -Ambulatory blood pressure monitoring with pharmacy

## 2022-08-28 NOTE — Patient Instructions (Signed)
It was great to see you! Thank you for allowing me to participate in your care!   Our plans for today:  - I have filled out your handicap form for 3 months and the shoe insert form -I am referring you to pharmacy clinic for ambulatory blood pressure monitoring and PFTs to see whether you need the albuterol -Instead of the Protonix I want to start you on Pepcid daily  Take care and seek immediate care sooner if you develop any concerns.  Levin Erp, MD

## 2022-08-28 NOTE — Telephone Encounter (Signed)
Sean Carter from [facility] calls nurse line requesting diabetic foot exam notes.   She repots receiving office notes from today, however no mention of foot exam. She reports patient will need a documented foot exam before they can see the patient. The foot exam has to be within the last 6 months.   Will forward to PCP.

## 2022-08-28 NOTE — Assessment & Plan Note (Signed)
Was started on Protonix in the hospital and feels like it has been helping him a lot.  I discussed we would like to trial him on Pepcid and see if this would help him given PPI risks. -Trial Pepcid 40 mg daily

## 2022-08-28 NOTE — Assessment & Plan Note (Signed)
Patient is following with vascular surgery closely.  Currently taking tramadol once a night for pain.  Has been weightbearing with his boot.  I provided a handicap placard for 3 months and form for shoe inserts completed as well. -Follow-up closely with vascular surgery next week -Discussed return precautions with patient

## 2022-08-28 NOTE — Progress Notes (Signed)
    SUBJECTIVE:   CHIEF COMPLAINT / HPI: Hospital follow-up   S/p right first and 2nd ray partial amputation on 07/22/2022.  Since then has been seen by vascular surgery 2 times.  On last visit he has been heel weightbearing in his postop shoe and was given Keflex prescription which he has completed as well as tramadol.  Hypertension: Patient endorses no problems Home medications include: Losartan 100 mg daily and HCTZ 25 mg daily Patient endorses taking these medications as prescribed. Denies any headache, shortness of breath, lower extremity swelling or chest pain   Most recent creatinine trend:  Lab Results  Component Value Date   CREATININE 1.18 07/24/2022   CREATININE 1.25 (H) 07/23/2022   CREATININE 1.01 07/22/2022   Patient does check blood pressure at home. 130-135/90 at home  Patient has had a BMP in the past 1 year.  ?Obstructive airway Singulair and albuterol were started by provider in the hospital and he says that it has been helping.  No formal PFTs are present in chart.  Has been taking the albuterol 3 times a day.  Reflux symptoms He was also started on Protonix in the hospital.  He feels like it is helped his symptoms of cough as well.  PERTINENT  PMH / PSH: T2DM  OBJECTIVE:   BP (!) 190/106   Pulse 82   Ht 5\' 7"  (1.702 m)   Wt 207 lb 3.2 oz (94 kg)   SpO2 100%   BMI 32.45 kg/m   General: Well appearing, NAD, awake, alert, responsive to questions Head: Normocephalic atraumatic CV: Regular rate and rhythm no murmurs rubs or gallops Respiratory: Clear to ausculation bilaterally, no increased work of breathing Abdomen: Soft, non-tender, non-distended, normoactive bowel sounds  Extremities: Right lower extremity with boot in place, well perfused toe with brisk cap refill and sensation, no lower extremity edema or pain to palpation near calves  ASSESSMENT/PLAN:   Hypertension associated with diabetes ([name]) Patient has significant hypertension while in  the office. 162/118>190/106 while in the office.  Has been adherent to his medications and took them this morning.  Says his blood pressures are 130s over 90s at home -Continue losartan 100 mg daily -Continue HCTZ 25 mg daily -Ambulatory blood pressure monitoring with pharmacy  History of wheezing Patient says he was started on albuterol and Singulair in the hospital for wheezing while there.  He says it was worst after his surgery where he had a lot of coughing.  I discussed I would like to evaluate with formal PFTs before prescribing albuterol and Singulair for him.  No history of smoking doubt COPD. -Pharmacy referral for PFT evaluations  Symptoms of gastroesophageal reflux Was started on Protonix in the hospital and feels like it has been helping him a lot.  I discussed we would like to trial him on Pepcid and see if this would help him given PPI risks. -Trial Pepcid 40 mg daily  Amputation of left great toe [facility]) Patient is following with vascular surgery closely.  Currently taking tramadol once a night for pain.  Has been weightbearing with his boot.  I provided a handicap placard for 3 months and form for shoe inserts completed as well. -Follow-up closely with vascular surgery next week -Discussed return precautions with patient   [facility], MD [facility]

## 2022-09-02 ENCOUNTER — Ambulatory Visit (INDEPENDENT_AMBULATORY_CARE_PROVIDER_SITE_OTHER): Payer: 59 | Admitting: Physician Assistant

## 2022-09-02 VITALS — BP 214/102 | HR 80 | Temp 97.9°F | Resp 20 | Ht 67.0 in | Wt 204.6 lb

## 2022-09-02 DIAGNOSIS — I739 Peripheral vascular disease, unspecified: Secondary | ICD-10-CM

## 2022-09-02 NOTE — Progress Notes (Signed)
POST OPERATIVE OFFICE NOTE    CC:  F/u for surgery  HPI:  This is a 51 y.o. male who is s/p right first and second partial ray amputation on 07/22/2022 by Dr. Lenell Antu.  He has been heel weightbearing in a postop shoe since surgery.  He and his girlfriend have been cleansing the wound daily with soap and water and performing wet-to-dry packing of the remaining portion of the incision slow to heal.  They believe this area is improving.  He denies any fevers, chills, nausea/vomiting.  He completed his antibiotic course.  He is on aspirin and statin daily.  Allergies  Allergen Reactions   Scallops [Shellfish Allergy] Anaphylaxis    Current Outpatient Medications  Medication Sig Dispense Refill   albuterol (VENTOLIN HFA) 108 (90 Base) MCG/ACT inhaler Inhale 2 puffs into the lungs every 6 (six) hours as needed for wheezing or shortness of breath. 8 g 0   atorvastatin (LIPITOR) 40 MG tablet Take 1 tablet by mouth once daily 60 tablet 0   Blood Gluc Meter Disp-Strips (RELION ALL-IN-ONE) DEVI 1 each by Does not apply route 3 (three) times daily as needed. 1 each 3   diphenhydrAMINE (BENADRYL) 25 MG tablet Take 25 mg by mouth every 6 (six) hours as needed for itching, allergies or sleep.     famotidine (PEPCID) 40 MG tablet Take 1 tablet (40 mg total) by mouth daily. 30 tablet 0   hydrochlorothiazide (HYDRODIURIL) 25 MG tablet Take 1 tablet by mouth once daily 60 tablet 0   Insulin Glargine (BASAGLAR KWIKPEN) 100 UNIT/ML Inject 28 Units into the skin daily. 15 mL 2   insulin lispro (HUMALOG) 100 UNIT/ML injection Inject 0.08 mLs (8 Units total) into the skin 3 (three) times daily before meals. 10 mL 2   losartan (COZAAR) 100 MG tablet Take 1 tablet by mouth once daily 60 tablet 0   montelukast (SINGULAIR) 10 MG tablet Take 1 tablet (10 mg total) by mouth at bedtime. 30 tablet 0   Multiple Vitamin (MULTIVITAMIN) tablet Take 1 tablet by mouth daily.     ReliOn Lancet Devices 30G MISC 1 each by Does not  apply route 3 (three) times daily as needed. 1 each 3   ReliOn Lancets Thin 26G MISC 1 each by Does not apply route 3 (three) times daily as needed. 1 each 3   traMADol (ULTRAM) 50 MG tablet Take 1 tablet (50 mg total) by mouth every 6 (six) hours as needed for severe pain. 20 tablet 0   ULTICARE INSULIN SYRINGE 30G X 1/2" 0.3 ML MISC See admin instructions.      No current facility-administered medications for this visit.     ROS:  See HPI  Physical Exam:  Vitals:   09/02/22 0836  BP: (!) 214/102  Pulse: 80  Resp: 20  Temp: 97.9 F (36.6 C)  TempSrc: Temporal  SpO2: 100%  Weight: 204 lb 9.6 oz (92.8 kg)  Height: 5\' 7"  (1.702 m)    Incision: Pictured below.  Well-appearing amputation site with improvement in the size of the defect.  No purulence noted.  No exposed bone. Extremities: Palpable right DP pulse Neuro: A&O    Assessment/Plan:  This is a 51 y.o. male who is s/p: Right first and second toe ray amputation  -Right foot well-perfused with palpable DP pulse -Remainder of staples and sutures removed in office today -Remaining area adjacent to the third toe seems to be improving.  Encouraged the patient and his girlfriend to continue  cleansing the wound at least daily with soap and water then patting dry.  Continue wet 2 x 2 packing of the small defect and wrapping the foot with Kerlix.  Continue heel weightbearing only.  This will likely take another month or so to heal completely.  He will be out of work until next year.  He will follow-up in office in another month to recheck the wound.   Emilie Rutter, PA-C [facility] [phone]  Clinic MD:  Randie Heinz

## 2022-09-07 ENCOUNTER — Encounter: Payer: Self-pay | Admitting: Pharmacist

## 2022-09-07 ENCOUNTER — Ambulatory Visit: Payer: 59 | Admitting: Pharmacist

## 2022-09-07 VITALS — BP 143/79 | HR 91 | Wt 206.2 lb

## 2022-09-07 DIAGNOSIS — R198 Other specified symptoms and signs involving the digestive system and abdomen: Secondary | ICD-10-CM | POA: Diagnosis not present

## 2022-09-07 DIAGNOSIS — R0982 Postnasal drip: Secondary | ICD-10-CM

## 2022-09-07 DIAGNOSIS — Z87898 Personal history of other specified conditions: Secondary | ICD-10-CM

## 2022-09-07 MED ORDER — LORATADINE 10 MG PO TABS: 10.0000 mg | ORAL_TABLET | Freq: Every day | ORAL | Status: DC

## 2022-09-07 MED ORDER — FAMOTIDINE 40 MG PO TABS: 40.0000 mg | ORAL_TABLET | Freq: Two times a day (BID) | ORAL | 0 refills | Status: DC

## 2022-09-07 MED ORDER — MONTELUKAST SODIUM 10 MG PO TABS: 10.0000 mg | ORAL_TABLET | Freq: Every day | ORAL | 1 refills | Status: DC

## 2022-09-07 MED ORDER — ALBUTEROL SULFATE HFA 108 (90 BASE) MCG/ACT IN AERS: 2.0000 | INHALATION_SPRAY | Freq: Four times a day (QID) | RESPIRATORY_TRACT | 1 refills | Status: DC | PRN

## 2022-09-07 NOTE — Patient Instructions (Addendum)
It was great to meet you today!  We are making the following changes to your medications today:  START a second generation antihistamine, such as Claritin, Allegra, or Zyrtec once daily  STOP Benadryl (diphenhydramine)  CONTINUE Singulair (montelukast) 10 mg daily  CONTINUE albuterol inhaler 2 puffs every 6 hours as needed  INCREASE Pepcid (famotidine) 40 mg twice daily    Blood Pressure Activity Diary Time Lying down/ Sleeping Walking/ Exercise Stressed/ Angry Headache/ Pain Dizzy  9 AM       10 AM       11 AM       12 PM       1 PM       2 PM       Time Lying down/ Sleeping Walking/ Exercise Stressed/ Angry Headache/ Pain Dizzy  3 PM       4 PM        5 PM       6 PM       7 PM       8 PM       Time Lying down/ Sleeping Walking/ Exercise Stressed/ Angry Headache/ Pain Dizzy  9 PM       10 PM       11 PM       12 AM       1 AM       2 AM       3 AM       Time Lying down/ Sleeping Walking/ Exercise Stressed/ Angry Headache/ Pain Dizzy  4 AM       5 AM       6 AM       7 AM       8 AM       9 AM       10 AM        Time you woke up: _________                  Time you went to sleep:__________  Come back tomorrow at 8:30 to have the monitor removed Call the [facility] if you have any questions before then [phone])  Wearing the Blood Pressure Monitor The cuff will inflate every 20 minutes during the day and every 30 minutes while you sleep. Your blood pressure readings will NOT display after cuff inflation Fill out the blood pressure-activity diary during the day, especially during activities that may affect your reading -- such as exercise, stress, walking, taking your blood pressure medications  Important things to know: Avoid taking the monitor off for the next 24 hours, unless it causes you discomfort or pain. Do NOT get the monitor wet and do NOT dry to clean the monitor with any cleaning products. Do NOT put the monitor on anyone else's  arm. When the cuff inflates, avoid excess movement. Let the cuffed arm hang loosely, slightly away from the body. Avoid flexing the muscles or moving the hand/fingers. When you go to sleep, make sure that the hose is not kinked. Remember to fill out the blood pressure activity diary. If you experience severe pain or unusual pain (not associated with getting your blood pressure checked), remove the monitor.  Troubleshooting:  Code  Troubleshooting   1  Check cuff position, tighten cuff   2, 3  Remain still during reading   4, 87  Check air hose connections and make sure cuff is tight   85, 89  Check hose connections and make tubing is not crimped   86  Push START/STOP to restart reading   88, 91  Retry by pushing START/STOP   90  Replace batteries. If problem persists, remove monitor and bring back to   clinic at follow up   97, 98, 99  Service required - Remove monitor and bring back to clinic at follow up

## 2022-09-07 NOTE — Progress Notes (Signed)
S:     Chief Complaint  Patient presents with   Medication Management    PFTs   Sean Carter is a 51 y.o. male who presents for lung function evaluation.  PMH is significant for hypertension and diabetes.  Patient was referred and last seen by Primary Care Provider, Dr. Laroy Apple, on 08/28/22.   At last visit, the patient requested refills on his albuterol and Singulair (montelukast) which he recently was prescribed for wheezing during a hospitalization.  Patient reports breathing has been improving since his hospitalization, "feeling back to normal". Patient reports atopic sx consistent with allergic rhinitis, such as post nasal drip and watery eyes. Patient reports notable allergy triggers include pollen, smoke and dust.   Patient reports symptoms of acid reflux and reports association of these symptoms with consumption of diary products, such as milk and cheese.  Patient denies adherence to medications (he ran out off them 2 weeks ago) Patient reports last dose of asthma medications was 2 weeks ago. Current respiratory medications: albuterol and Singulair (montelukast)  Level of asthma sx control- in the last 4 weeks: Question Scoring Patient Score  Daytime sx > 2x/week Yes (1)   No (0) 0  Any nighttime waking due to asthma Yes (1)   No (0) 0  Reliever needed >2x/week Yes (1)   No (0) 0  Any activity limitation due to asthma Yes (1)   No (0) 0   Total Score   Well controlled - 0, Partly controlled - 1-2, Uncontrolled 3-4  Set up patient for ambulatory blood pressure monitor with sleep time set at 9:30PM and wake time set at 6:00AM. Discussed procedure for wearing the monitor and gave patient written instructions. Monitor was placed on non-dominant arm with instructions to return in the morning.    O: Review of Systems  All other systems reviewed and are negative.   Physical Exam Constitutional:      Appearance: Normal appearance.  Pulmonary:     Effort: Pulmonary  effort is normal.  Neurological:     Mental Status: He is alert.  Psychiatric:        Mood and Affect: Mood normal.        Behavior: Behavior normal.        Thought Content: Thought content normal.        Judgment: Judgment normal.     Vitals:   09/07/22 0842  BP: (!) 153/84  Pulse: 91  SpO2: 100%    See "scanned report" or Documentation Flowsheet (discrete results - PFTs) for  Spirometry results. Patient provided good effort while attempting spirometry.   Lung Age = 74 Albuterol Neb  Lot# [id number]     Exp. 12/16/21  A/P: Patient has been experiencing chronic allergic rhinitis, throat clearing, and post nasal drip and was taking Singulair (montelukast) and albuterol following his hospitalization. Medication adherence poor due to the patient running out of the medications 2 weeks ago. Spirometry evaluation reveals Mild restrictive lung disease. Post nebulized albuterol tx revealed modest reversibility and "Normal" spirometry  - Begin Claritin (loratadine) 10 mg daily - Restart Singulair (montelukast) 10 mg daily - Continue albuterol inhaler 2 puffs every 6 hours as needed - Discontinue Benedryl (diphenhydramine) -Educated patient on purpose, proper use, potential adverse effects. -Reviewed results of pulmonary function tests. Patient verbalized understanding of treatment plan.   Chronic acid reflux currently moderately controlled on once daily Pepcid (famotidine).   - Increase Pepcid (famotidine) to twice daily.   Written patient instructions provided.  Total time in face to face counseling 43 minutes.    Follow-up:  Pharmacist in 1 month. PCP clinic visit in 3-6 months.  Patient seen with Ilene Qua, PharmD Candidate.

## 2022-09-07 NOTE — Assessment & Plan Note (Signed)
Patient has been experiencing chronic allergic rhinitis, throat clearing, and post nasal drip and was taking Singulair (montelukast) and albuterol following his hospitalization. Medication adherence poor due to the patient running out of the medications 2 weeks ago. Spirometry evaluation reveals Mild restrictive lung disease. Post nebulized albuterol tx revealed modest reversibility and "Normal" spirometry  - Begin Claritin (loratadine) 10 mg daily - Restart Singulair (montelukast) 10 mg daily - Continue albuterol inhaler 2 puffs every 6 hours as needed - Discontinue Benedryl (diphenhydramine) -Educated patient on purpose, proper use, potential adverse effects. -Reviewed results of pulmonary function tests. Patient verbalized understanding of treatment plan.

## 2022-09-08 ENCOUNTER — Encounter: Payer: Self-pay | Admitting: Pharmacist

## 2022-09-08 ENCOUNTER — Ambulatory Visit (INDEPENDENT_AMBULATORY_CARE_PROVIDER_SITE_OTHER): Payer: Self-pay | Admitting: Pharmacist

## 2022-09-08 DIAGNOSIS — E1159 Type 2 diabetes mellitus with other circulatory complications: Secondary | ICD-10-CM

## 2022-09-08 DIAGNOSIS — I152 Hypertension secondary to endocrine disorders: Secondary | ICD-10-CM

## 2022-09-08 MED ORDER — AMLODIPINE-VALSARTAN-HCTZ 5-160-12.5 MG PO TABS: 1.0000 | ORAL_TABLET | Freq: Every day | ORAL | 5 refills | Status: DC

## 2022-09-08 NOTE — Patient Instructions (Signed)
Nice to see you today.   Blood pressure was elevated with awake average of 147/84  STOP Losartan and HCTZ  START combination amlodipine / valsartan / HCTZ - 1 pill daily

## 2022-09-08 NOTE — Assessment & Plan Note (Signed)
History of hypertension for several years currently taking losartan and HCTZ;  with goal presssure of <130/80. Found to have persistently elevated and uncontrolled blood pressure with 24-hour ambulatory blood pressure evaluation which demonstrates an average AWAKE blood pressure of 147/84 mmHg. Nocturnal dipping pattern is abnormal  which may be due to  poor sleep during the test.   Changes to medications - STOP losartan/HCTZ - START triple therapy with: amlodipine 5mg /valsartan 160mg /HCTZ 12.5mg  - 1 tablet daily Results reviewed and written information provided.

## 2022-09-08 NOTE — Progress Notes (Signed)
S:     Chief Complaint  Patient presents with   Medication Management    Amb BP Monitor   51 y.o. male who presents for hypertension evaluation, education, and management.  He is accompanied by his "girlfriend" Scarlette Calico.  PMH is significant for diabetes with recent right foot amputation of two toes.  Additionally was seen yesterday in my clinic for PFTs.  Amb BP monitor was placed yesterday.   He returns today to discuss results.   Patient was referred and last seen by Primary Care Provider, Dr. Laroy Apple, on 08/28/2022.   Diagnosed with Hypertension several years ago.    Medication compliance is reported to be good.  Current BP Medications include:  losartan 100mg  and HCTZ 25mg   Antihypertensives tried in the past include: none  Day #2 - Patient returns to clinic and reports not sleeping well. Patient reports they were able to wear the Ambulatory Blood Pressure Cuff for the entire 24 evaluation period.   O:  Review of Systems  Cardiovascular:  Negative for leg swelling.    Physical Exam Pulmonary:     Effort: Pulmonary effort is normal.  Neurological:     Mental Status: He is alert.  Psychiatric:        Mood and Affect: Mood normal.        Behavior: Behavior normal.        Thought Content: Thought content normal.     Last 3 Office BP readings: BP Readings from Last 3 Encounters:  09/07/22 (!) 143/79  09/02/22 (!) 214/102  08/28/22 (!) 190/106    Clinical Atherosclerotic Cardiovascular Disease (ASCVD): No  The 10-year ASCVD risk score (Arnett DK, et al., 2019) is: 8.7%   Values used to calculate the score:     Age: 61 years     Sex: Male     Is Non-Hispanic African American: No     Diabetic: Yes     Tobacco smoker: No     Systolic Blood Pressure: 143 mmHg     Is BP treated: No     HDL Cholesterol: 41 mg/dL     Total Cholesterol: 171 mg/dL  Basic Metabolic Panel    Component Value Date/Time   NA 139 07/24/2022 0216   NA 142 11/17/2021 1732   K 3.8  07/24/2022 0216   CL 101 07/24/2022 0216   CO2 28 07/24/2022 0216   GLUCOSE 105 (H) 07/24/2022 0216   BUN 20 07/24/2022 0216   BUN 37 (H) 11/17/2021 1732   CREATININE 1.18 07/24/2022 0216   CREATININE 0.78 07/10/2020 0910   CALCIUM 8.6 (L) 07/24/2022 0216   GFRNONAA >60 07/24/2022 0216   GFRNONAA 106 07/10/2020 0910   GFRAA 123 07/10/2020 0910    Renal function: CrCl cannot be calculated (Patient's most recent lab result is older than the maximum 21 days allowed.).   ABPM Study Data: Arm Placement left arm  Overall Mean 24hr BP:   147/81 mmHg  HR: 82  Daytime Mean BP:  147/84 mmHg  HR: 86  Nighttime Mean BP:  145/75 mmHg  HR: 71  Dipping Pattern: No.  Sys:   1.4%   Dia: 9%   [normal dipping ~10-20%]   For Office Goal Goal BP of <130/80:  ABPM thresholds: Overall BP < 125/75, daytime BP <130/80 mmHg, sleeptime BP <110/65 mmHg   A/P: History of hypertension for several years currently taking losartan and HCTZ;  with goal presssure of <130/80. Found to have persistently elevated and uncontrolled blood pressure with  24-hour ambulatory blood pressure evaluation which demonstrates an average AWAKE blood pressure of 147/84 mmHg. Nocturnal dipping pattern is abnormal  which may be due to  poor sleep during the test .   Changes to medications - STOP losartan/HCTZ - START triple therapy with: amlodipine 5mg 160mg /HCTZ 12.5mg  - 1 tablet daily Results reviewed and written information provided.    Written patient instructions provided. Patient verbalized understanding of treatment plan.  Total time in face to face counseling 22  minutes.    Follow-up:  Pharmacist 1 month (for allergic rhinitis) PCP clinic visit in 6-8 weeks.

## 2022-09-08 NOTE — Progress Notes (Signed)
Reviewed: I agree with Dr. Koval's documentation and management. 

## 2022-09-14 ENCOUNTER — Other Ambulatory Visit: Payer: Self-pay | Admitting: Student

## 2022-09-14 DIAGNOSIS — R198 Other specified symptoms and signs involving the digestive system and abdomen: Secondary | ICD-10-CM

## 2022-09-18 ENCOUNTER — Other Ambulatory Visit: Payer: Self-pay

## 2022-09-18 DIAGNOSIS — R198 Other specified symptoms and signs involving the digestive system and abdomen: Secondary | ICD-10-CM

## 2022-09-18 NOTE — Telephone Encounter (Signed)
Patient calls nurse line in regards to Famotidine.   Patient reports he is taking 40mg  BID.   Please send in a new prescription for 40mg  BID.  Last prescription he was written for #30 which is only a 15 day supply.   Please send in for #60 tablets.

## 2022-09-21 MED ORDER — FAMOTIDINE 40 MG PO TABS: 40.0000 mg | ORAL_TABLET | Freq: Two times a day (BID) | ORAL | 0 refills | Status: DC

## 2022-09-21 NOTE — Telephone Encounter (Signed)
Patient returns call to nurse line regarding rx refill. Per chart review, medication was set to "no print", resent electronically.   Receipt confirmed at 1710 on 09/21/22.  Veronda Prude, RN

## 2022-09-21 NOTE — Addendum Note (Signed)
Addended by: Veronda Prude on: 09/21/2022 05:11 PM   Modules accepted: Orders

## 2022-09-25 ENCOUNTER — Ambulatory Visit: Payer: Self-pay | Admitting: Student

## 2022-09-25 NOTE — Progress Notes (Deleted)
    SUBJECTIVE:   CHIEF COMPLAINT / HPI:   Diabetic Foot Exam Patient require diabetic foot exam for  Hypertension: Recent ambulatory BP monitoring with persistent elevation in BP.   Home medications include: Started on triple therapy with amlodipine 5 mg, valsartan 160 mg and HCTZ 12.5 mg combined pill.  Patient endorses taking these medications as prescribed.*** Denies any headache, vision changes, shortness of breath, lower extremity swelling or chest pain   Most recent creatinine trend:  Lab Results  Component Value Date   CREATININE 1.18 07/24/2022   CREATININE 1.25 (H) 07/23/2022   CREATININE 1.01 07/22/2022   Patient {rwdoesdoesnot:[id number]} check blood pressure at home.  Patient {HAS HAS [id number]} had a BMP in the past 1 year.  PERTINENT  PMH / PSH: ***  OBJECTIVE:   There were no vitals taken for this visit.  Diabetic Foot Exam - Simple   No data filed      ASSESSMENT/PLAN:   No problem-specific Assessment & Plan notes found for this encounter.     Levin Erp, MD [facility]

## 2022-09-30 ENCOUNTER — Ambulatory Visit (INDEPENDENT_AMBULATORY_CARE_PROVIDER_SITE_OTHER): Payer: 59 | Admitting: Physician Assistant

## 2022-09-30 VITALS — BP 172/97 | HR 88 | Temp 98.0°F | Ht 68.0 in | Wt 208.0 lb

## 2022-09-30 DIAGNOSIS — I739 Peripheral vascular disease, unspecified: Secondary | ICD-10-CM

## 2022-09-30 NOTE — Progress Notes (Signed)
POST OPERATIVE OFFICE NOTE    CC:  F/u for surgery  HPI:  This is a 51 y.o. male who is s/p right first and second partial ray amputation on 07/22/2022 by Dr. Lenell Antu.  His girlfriend has been helping cleanse the wound daily with soap and water and apply a dressing.  He continues to wear his Darco shoe to keep his weight on his heel.  He believes the incision is nearly healed.  He is on an aspirin and statin daily.  Allergies  Allergen Reactions   Scallops [Shellfish Allergy] Anaphylaxis   Penicillins Other (See Comments)    Unknown reaction as a child    Current Outpatient Medications  Medication Sig Dispense Refill   acetaminophen (TYLENOL) 500 MG tablet Take 500 mg by mouth every 6 (six) hours as needed.     albuterol (VENTOLIN HFA) 108 (90 Base) MCG/ACT inhaler Inhale 2 puffs into the lungs every 6 (six) hours as needed for wheezing or shortness of breath. 8 g 1   amLODIPine-Valsartan-HCTZ 5-160-12.5 MG TABS Take 1 tablet by mouth daily at 8 pm. 30 tablet 5   atorvastatin (LIPITOR) 40 MG tablet Take 1 tablet by mouth once daily 60 tablet 0   Blood Gluc Meter Disp-Strips (RELION ALL-IN-ONE) DEVI 1 each by Does not apply route 3 (three) times daily as needed. 1 each 3   diphenhydrAMINE (BENADRYL) 25 MG tablet Take 25 mg by mouth every 6 (six) hours as needed for itching, allergies or sleep.     famotidine (PEPCID) 40 MG tablet Take 1 tablet (40 mg total) by mouth 2 (two) times daily. 60 tablet 0   Insulin Glargine (BASAGLAR KWIKPEN) 100 UNIT/ML Inject 28 Units into the skin daily. 15 mL 2   insulin lispro (HUMALOG) 100 UNIT/ML injection Inject 0.08 mLs (8 Units total) into the skin 3 (three) times daily before meals. 10 mL 2   loratadine (CLARITIN) 10 MG tablet Take 1 tablet (10 mg total) by mouth daily.     montelukast (SINGULAIR) 10 MG tablet Take 1 tablet (10 mg total) by mouth at bedtime. 30 tablet 1   Multiple Vitamin (MULTIVITAMIN) tablet Take 1 tablet by mouth daily.     ReliOn  Lancet Devices 30G MISC 1 each by Does not apply route 3 (three) times daily as needed. 1 each 3   ReliOn Lancets Thin 26G MISC 1 each by Does not apply route 3 (three) times daily as needed. 1 each 3   traMADol (ULTRAM) 50 MG tablet Take 1 tablet (50 mg total) by mouth every 6 (six) hours as needed for severe pain. 20 tablet 0   ULTICARE INSULIN SYRINGE 30G X 1/2" 0.3 ML MISC See admin instructions.      No current facility-administered medications for this visit.     ROS:  See HPI  Physical Exam:  Vitals:   09/30/22 0840  BP: (!) 172/97  Pulse: 88  Temp: 98 F (36.7 C)  TempSrc: Temporal  SpO2: 99%  Weight: 208 lb (94.3 kg)  Height: 5\' 8"  (1.727 m)    Incision: Very small area next to his third toe with granulation tissue and no sign of infection; palpable right ATA pulse  Assessment/Plan:  This is a 51 y.o. male who is s/p: Right first and second partial ray amputation  -Right foot well-perfused with palpable ATA pulse -Amputation site is nearly completely healed with only a small area remaining adjacent to his third toe.  This area does not appear to be  infected and will likely be healed at his next office visit.  He will follow-up in about 4 weeks to make sure he completely heals before returning to work.  He will continue to wear his Darco shoe for heel weightbearing only.  Continue cleansing the area with soap and water daily and patting dry.  He will call/return office sooner with any questions or concerns.   Emilie Rutter, PA-C [facility] [phone]  Clinic MD:  Randie Heinz

## 2022-10-07 ENCOUNTER — Ambulatory Visit: Payer: Self-pay | Admitting: Student

## 2022-10-10 ENCOUNTER — Ambulatory Visit
Admission: RE | Admit: 2022-10-10 | Discharge: 2022-10-10 | Disposition: A | Discharge status: Home / Self Care | Payer: No Typology Code available for payment source | Source: Ambulatory Visit | Attending: Nurse Practitioner | Admitting: Nurse Practitioner

## 2022-10-10 VITALS — BP 153/71 | HR 94 | Temp 98.8°F | Resp 18

## 2022-10-10 DIAGNOSIS — J0140 Acute pansinusitis, unspecified: Secondary | ICD-10-CM | POA: Diagnosis not present

## 2022-10-10 DIAGNOSIS — H6691 Otitis media, unspecified, right ear: Secondary | ICD-10-CM

## 2022-10-10 MED ORDER — CETIRIZINE HCL 10 MG PO TABS: 10.0000 mg | ORAL_TABLET | Freq: Every day | ORAL | 0 refills | Status: DC

## 2022-10-10 MED ORDER — AZITHROMYCIN 250 MG PO TABS: 250.0000 mg | ORAL_TABLET | Freq: Every day | ORAL | 0 refills | Status: DC

## 2022-10-10 MED ORDER — PSEUDOEPH-BROMPHEN-DM 30-2-10 MG/5ML PO SYRP: 5.0000 mL | ORAL_SOLUTION | Freq: Four times a day (QID) | ORAL | 0 refills | Status: DC | PRN

## 2022-10-10 NOTE — ED Triage Notes (Signed)
Pt reports hearing decreased in left ear, chest congestion and nasal congestion x 1 week. Mucinex gives some relief.

## 2022-10-10 NOTE — Discharge Instructions (Addendum)
Take medication as directed. Continue your current allergy medication regimen. Increase fluids and get plenty of rest. May take over-the-counter ibuprofen or Tylenol as needed for pain, fever, or general discomfort. Recommend normal saline nasal spray to help with nasal congestion throughout the day. For your cough, it may be helpful to use a humidifier at bedtime during sleep. Warm compresses to the affected ear help with comfort. Do not stick anything inside the ear while symptoms persist. Avoid getting water inside of the ear while symptoms persist. As discussed, if symptoms do not improve with this treatment, please follow-up with your primary care physician for further evaluation. Follow-up as needed.

## 2022-10-10 NOTE — ED Provider Notes (Signed)
[facility]    CSN: [id number] Arrival date & time: 10/10/22  2992      History   Chief Complaint Chief Complaint  Patient presents with   Appointment   Ear Fullness   Nasal Congestion    HPI Sean Carter is a 51 y.o. male.   The history is provided by the patient.   The patient presents with a 1 week history of upper respiratory symptoms.  Patient complains of headache, nasal congestion, bilateral ear pressure, right ear pain, and cough.  Patient states that he did have a fever early on when his symptoms started, which is since improved.  He denies wheezing, shortness of breath, difficulty breathing, or GI symptoms.  Patient reports he has been taking Mucinex which gives him some relief.  He states over the last several days, he has had worsening pain and decreased hearing in his ears.  Past Medical History:  Diagnosis Date   Blurry vision 07/10/2020   Diabetes mellitus without complication ([name])    Gangrene of toe of left foot ([name])    Sepsis due to Streptococcus, group B ([name]) 06/20/2020   Streptococcal bacteremia     Patient Active Problem List   Diagnosis Date Noted   History of wheezing 08/28/2022   Symptoms of gastroesophageal reflux 08/28/2022   Necrotic toes ([name]) 07/20/2022   Gangrene of toe ([name]) 07/20/2022   Leukocytosis 07/20/2022   Healthcare maintenance 06/29/2022   Diabetic retinopathy ([name]) 06/29/2022   Mixed hyperlipidemia 11/20/2021   Blurry vision 07/10/2020   Sepsis due to Streptococcus, group B ([name]) 06/20/2020   Superficial vein thrombosis 06/06/2020   Hypertension associated with diabetes ([name]) 06/06/2020   Diabetes mellitus type 2 with peripheral artery disease ([name]) 06/06/2020   Gangrene of toe of left foot ([name])    Amputation of left great toe ([name])    Hypokalemia 05/30/2020    Past Surgical History:  Procedure Laterality Date   AMPUTATION Left 05/31/2020   Procedure: Ray Amputation of Left Great Toe with Negative Pressure  Vac Placement;  Surgeon: Cephus Shelling, MD;  Location[facility] OR;  Service: Vascular;  Laterality: Left;   AMPUTATION Right 07/22/2022   Procedure: RIGHT FIRST AND SECOND RAY PARTIAL  AMPUTATION;  Surgeon: Leonie Douglas, MD;  Location: [facility] OR;  Service: Vascular;  Laterality: Right;       Home Medications    Prior to Admission medications   Medication Sig Start Date End Date Taking? Authorizing Provider  azithromycin (ZITHROMAX) 250 MG tablet Take 1 tablet (250 mg total) by mouth daily. Take first 2 tablets together, then 1 every day until finished. 10/10/22  Yes Yarely Bebee-Warren, Sadie Haber, NP  brompheniramine-pseudoephedrine-DM 30-2-10 MG/5ML syrup Take 5 mLs by mouth 4 (four) times daily as needed. 10/10/22  Yes Marionette Meskill-Warren, Sadie Haber, NP  cetirizine (ZYRTEC) 10 MG tablet Take 1 tablet (10 mg total) by mouth daily. 10/10/22  Yes Menashe Kafer-Warren, Sadie Haber, NP  acetaminophen (TYLENOL) 500 MG tablet Take 500 mg by mouth every 6 (six) hours as needed.    [provider]  albuterol (VENTOLIN HFA) 108 (90 Base) MCG/ACT inhaler Inhale 2 puffs into the lungs every 6 (six) hours as needed for wheezing or shortness of breath. 09/07/22   Moses Manners, MD  amLODIPine-Valsartan-HCTZ [phone] MG TABS Take 1 tablet by mouth daily at 8 pm. 09/08/22   Moses Manners, MD  atorvastatin (LIPITOR) 40 MG tablet Take 1 tablet by mouth once daily 08/10/22   Levin Erp,  MD  Blood Gluc Meter Disp-Strips (RELION ALL-IN-ONE) DEVI 1 each by Does not apply route 3 (three) times daily as needed. 07/02/20   Katha CabalBrimage, Vondra, DO  diphenhydrAMINE (BENADRYL) 25 MG tablet Take 25 mg by mouth every 6 (six) hours as needed for itching, allergies or sleep.    [provider]  famotidine (PEPCID) 40 MG tablet Take 1 tablet (40 mg total) by mouth 2 (two) times daily. 09/21/22 10/21/22  Levin ErpJagadish, Mayuri, MD  Insulin Glargine (BASAGLAR KWIKPEN) 100 UNIT/ML Inject 28 Units into the skin daily. 11/20/21    Brimage, Seward MethVondra, DO  insulin lispro (HUMALOG) 100 UNIT/ML injection Inject 0.08 mLs (8 Units total) into the skin 3 (three) times daily before meals. 11/20/21   Katha CabalBrimage, Vondra, DO  loratadine (CLARITIN) 10 MG tablet Take 1 tablet (10 mg total) by mouth daily. 09/07/22   Moses MannersHensel, William A, MD  montelukast (SINGULAIR) 10 MG tablet Take 1 tablet (10 mg total) by mouth at bedtime. 09/07/22   Moses MannersHensel, William A, MD  Multiple Vitamin (MULTIVITAMIN) tablet Take 1 tablet by mouth daily.    [provider]  ReliOn Lancet Devices 30G MISC 1 each by Does not apply route 3 (three) times daily as needed. 07/02/20   Brimage, Seward MethVondra, DO  ReliOn Lancets Thin 26G MISC 1 each by Does not apply route 3 (three) times daily as needed. 10/04/20   Westley ChandlerBrown, Carina M, MD  traMADol (ULTRAM) 50 MG tablet Take 1 tablet (50 mg total) by mouth every 6 (six) hours as needed for severe pain. 08/20/22   Emilie RutterEveland, Matthew, PA-C  ULTICARE INSULIN SYRINGE 30G X 1/2" 0.3 ML MISC See admin instructions.  06/03/20   [provider]    Family History History reviewed. No pertinent family history.  Social History Social History   Tobacco Use   Smoking status: Never    Passive exposure: Never   Smokeless tobacco: Never  Vaping Use   Vaping Use: Never used  Substance Use Topics   Drug use: Never     Allergies   Scallops [shellfish allergy] and Penicillins   Review of Systems Review of Systems Per HPI  Physical Exam Triage Vital Signs ED Triage Vitals  Enc Vitals Group     BP 10/10/22 0906 (!) 153/71     Pulse Rate 10/10/22 0906 94     Resp 10/10/22 0906 18     Temp 10/10/22 0906 98.8 F (37.1 C)     Temp Source 10/10/22 0906 Oral     SpO2 10/10/22 0906 96 %     Weight --      Height --      Head Circumference --      Peak Flow --      Pain Score 10/10/22 0908 0     Pain Loc --      Pain Edu? --      Excl. in GC? --    No data found.  Updated Vital Signs BP (!) 153/71 (BP Location: Right  Arm)   Pulse 94   Temp 98.8 F (37.1 C) (Oral)   Resp 18   SpO2 96%   Visual Acuity Right Eye Distance:   Left Eye Distance:   Bilateral Distance:    Right Eye Near:   Left Eye Near:    Bilateral Near:     Physical Exam Vitals and nursing note reviewed.  Constitutional:      General: He is not in acute distress.    Appearance: Normal appearance.  HENT:     Head: Normocephalic.     Right Ear: Ear canal and external ear normal. Decreased hearing noted. A middle ear effusion is present. Tympanic membrane is erythematous and bulging.     Left Ear: Ear canal and external ear normal. Decreased hearing noted. A middle ear effusion is present.     Nose: Congestion present. No rhinorrhea.     Right Turbinates: Enlarged and swollen.     Left Turbinates: Enlarged and swollen.     Right Sinus: Maxillary sinus tenderness and frontal sinus tenderness present.     Left Sinus: Maxillary sinus tenderness and frontal sinus tenderness present.     Mouth/Throat:     Lips: Pink.     Mouth: Mucous membranes are moist.     Pharynx: Uvula midline. Posterior oropharyngeal erythema present. No pharyngeal swelling.     Tonsils: No tonsillar exudate.  Eyes:     Extraocular Movements: Extraocular movements intact.     Conjunctiva/sclera: Conjunctivae normal.     Pupils: Pupils are equal, round, and reactive to light.  Cardiovascular:     Rate and Rhythm: Normal rate and regular rhythm.     Pulses: Normal pulses.     Heart sounds: Normal heart sounds.  Pulmonary:     Effort: Pulmonary effort is normal. No respiratory distress.     Breath sounds: Normal breath sounds. No stridor. No wheezing, rhonchi or rales.  Abdominal:     General: Bowel sounds are normal.     Palpations: Abdomen is soft.     Tenderness: There is no abdominal tenderness.  Musculoskeletal:     Cervical back: Normal range of motion.  Lymphadenopathy:     Cervical: No cervical adenopathy.  Skin:    General: Skin is warm and  dry.  Neurological:     General: No focal deficit present.     Mental Status: He is alert and oriented to person, place, and time.  Psychiatric:        Mood and Affect: Mood normal.        Behavior: Behavior normal.      UC Treatments / Results  Labs (all labs ordered are listed, but only abnormal results are displayed) Labs Reviewed - No data to display  EKG   Radiology No results found.  Procedures Procedures (including critical care time)  Medications Ordered in UC Medications - No data to display  Initial Impression / Assessment and Plan / UC Course  I have reviewed the triage vital signs and the nursing notes.  Pertinent labs & imaging results that were available during my care of the patient were reviewed by me and considered in my medical decision making (see chart for details).  The patient is well-appearing, he is in no acute distress, vital signs are stable.  Symptoms are consistent with a right otitis media and acute pansinusitis.  Will treat with azithromycin 250 mg, cetirizine 10 mg, and Bromfed for his cough.  Patient was advised to use over-the-counter normal saline nasal spray to help with his nasal congestion.  Supportive care recommendations were provided to the patient.  Patient was advised to follow-up with his primary care physician if symptoms do not improve with this treatment regimen.  Patient verbalizes understanding.  All questions were answered.  Patient is stable for discharge.   Final Clinical Impressions(s) / UC Diagnoses   Final diagnoses:  Acute right otitis media  Acute pansinusitis, recurrence not specified     Discharge Instructions  Take medication as directed. Continue your current allergy medication regimen. Increase fluids and get plenty of rest. May take over-the-counter ibuprofen or Tylenol as needed for pain, fever, or general discomfort. Recommend normal saline nasal spray to help with nasal congestion throughout the  day. For your cough, it may be helpful to use a humidifier at bedtime during sleep. Warm compresses to the affected ear help with comfort. Do not stick anything inside the ear while symptoms persist. Avoid getting water inside of the ear while symptoms persist. As discussed, if symptoms do not improve with this treatment, please follow-up with your primary care physician for further evaluation. Follow-up as needed.     ED Prescriptions     Medication Sig Dispense Auth. Provider   brompheniramine-pseudoephedrine-DM 30-2-10 MG/5ML syrup Take 5 mLs by mouth 4 (four) times daily as needed. 140 mL Jady Braggs-Warren, Sadie Haber, NP   cetirizine (ZYRTEC) 10 MG tablet Take 1 tablet (10 mg total) by mouth daily. 30 tablet Amanii Snethen-Warren, Sadie Haber, NP   azithromycin (ZITHROMAX) 250 MG tablet Take 1 tablet (250 mg total) by mouth daily. Take first 2 tablets together, then 1 every day until finished. 6 tablet Prathik Aman-Warren, Sadie Haber, NP      PDMP not reviewed this encounter.   Abran Cantor, NP 10/10/22 [phone]

## 2022-10-14 ENCOUNTER — Other Ambulatory Visit: Payer: Self-pay | Admitting: Family Medicine

## 2022-10-14 ENCOUNTER — Other Ambulatory Visit: Payer: Self-pay | Admitting: Student

## 2022-10-14 DIAGNOSIS — R0982 Postnasal drip: Secondary | ICD-10-CM

## 2022-10-14 DIAGNOSIS — E785 Hyperlipidemia, unspecified: Secondary | ICD-10-CM

## 2022-10-16 ENCOUNTER — Other Ambulatory Visit: Payer: Self-pay | Admitting: Family Medicine

## 2022-10-16 DIAGNOSIS — E1151 Type 2 diabetes mellitus with diabetic peripheral angiopathy without gangrene: Secondary | ICD-10-CM

## 2022-10-19 ENCOUNTER — Other Ambulatory Visit: Payer: Self-pay | Admitting: Student

## 2022-10-19 DIAGNOSIS — R198 Other specified symptoms and signs involving the digestive system and abdomen: Secondary | ICD-10-CM

## 2022-10-20 NOTE — Progress Notes (Unsigned)
POST OPERATIVE OFFICE NOTE    CC:  F/u for surgery  HPI:  This is a 52 y.o. male who is s/p right first and second partial ray amputation on 07/22/2022 by Dr. Stanford Breed.  He was last seen on 09/30/2022 and at that time, he felt his incisions were healing well and he was wearing his Darco shoe.  His girlfriend was helping cleanse the wound daily with soap and water and dressing.  His amputation site is nearly completely healed with only a small area remaining adjacent to his 3rd toe.  It did not appear infected and felt it would be healed by next office visit.    Pt returns today for follow up.  Pt states ***   Allergies  Allergen Reactions   Scallops [Shellfish Allergy] Anaphylaxis   Penicillins Other (See Comments)    Unknown reaction as a child    Current Outpatient Medications  Medication Sig Dispense Refill   acetaminophen (TYLENOL) 500 MG tablet Take 500 mg by mouth every 6 (six) hours as needed.     albuterol (VENTOLIN HFA) 108 (90 Base) MCG/ACT inhaler INHALE 2 PUFFS BY MOUTH EVERY 6 HOURS AS NEEDED FOR WHEEZING FOR SHORTNESS OF BREATH 9 g 0   amLODIPine-Valsartan-HCTZ 5-160-12.5 MG TABS Take 1 tablet by mouth daily at 8 pm. 30 tablet 5   atorvastatin (LIPITOR) 40 MG tablet Take 1 tablet by mouth once daily 60 tablet 0   azithromycin (ZITHROMAX) 250 MG tablet Take 1 tablet (250 mg total) by mouth daily. Take first 2 tablets together, then 1 every day until finished. 6 tablet 0   Blood Gluc Meter Disp-Strips (RELION ALL-IN-ONE) DEVI 1 each by Does not apply route 3 (three) times daily as needed. 1 each 3   brompheniramine-pseudoephedrine-DM 30-2-10 MG/5ML syrup Take 5 mLs by mouth 4 (four) times daily as needed. 140 mL 0   cetirizine (ZYRTEC) 10 MG tablet Take 1 tablet (10 mg total) by mouth daily. 30 tablet 0   diphenhydrAMINE (BENADRYL) 25 MG tablet Take 25 mg by mouth every 6 (six) hours as needed for itching, allergies or sleep.     famotidine (PEPCID) 40 MG tablet Take 1 tablet  (40 mg total) by mouth 2 (two) times daily. 60 tablet 0   Insulin Glargine (BASAGLAR KWIKPEN) 100 UNIT/ML DIAL AND INJECT 28 UNITS UNDER THE SKIN ONCE DAILY IN THE MORNING. MAX DAILY DOSE 28 UNITS 30 mL 0   insulin lispro (HUMALOG) 100 UNIT/ML injection Inject 0.08 mLs (8 Units total) into the skin 3 (three) times daily before meals. 10 mL 2   loratadine (CLARITIN) 10 MG tablet Take 1 tablet (10 mg total) by mouth daily.     montelukast (SINGULAIR) 10 MG tablet Take 1 tablet (10 mg total) by mouth at bedtime. 30 tablet 1   Multiple Vitamin (MULTIVITAMIN) tablet Take 1 tablet by mouth daily.     ReliOn Lancet Devices 30G MISC 1 each by Does not apply route 3 (three) times daily as needed. 1 each 3   ReliOn Lancets Thin 26G MISC 1 each by Does not apply route 3 (three) times daily as needed. 1 each 3   traMADol (ULTRAM) 50 MG tablet Take 1 tablet (50 mg total) by mouth every 6 (six) hours as needed for severe pain. 20 tablet 0   ULTICARE INSULIN SYRINGE 30G X 1/2" 0.3 ML MISC See admin instructions.      No current facility-administered medications for this visit.     ROS:  See  HPI  Physical Exam:  ***  Incision:  *** Extremities:  *** Neuro: *** Abdomen:  ***    Assessment/Plan:  This is a 52 y.o. male who is s/p:  right first and second partial ray amputation on 07/22/2022 by Dr. Stanford Breed  -***   Leontine Locket, [facility] [phone]   Clinic MD:  Donzetta Matters

## 2022-10-21 ENCOUNTER — Encounter: Payer: Self-pay | Admitting: Physician Assistant

## 2022-10-21 ENCOUNTER — Ambulatory Visit (INDEPENDENT_AMBULATORY_CARE_PROVIDER_SITE_OTHER): Payer: Self-pay | Admitting: Physician Assistant

## 2022-10-21 VITALS — BP 147/88 | HR 89 | Temp 97.8°F | Ht 68.0 in | Wt 204.0 lb

## 2022-10-21 DIAGNOSIS — S98112A Complete traumatic amputation of left great toe, initial encounter: Secondary | ICD-10-CM

## 2022-10-21 DIAGNOSIS — Z89421 Acquired absence of other right toe(s): Secondary | ICD-10-CM

## 2022-10-21 DIAGNOSIS — S98111A Complete traumatic amputation of right great toe, initial encounter: Secondary | ICD-10-CM

## 2022-11-02 ENCOUNTER — Other Ambulatory Visit: Payer: Self-pay | Admitting: Family Medicine

## 2022-11-02 DIAGNOSIS — R0982 Postnasal drip: Secondary | ICD-10-CM

## 2022-11-04 ENCOUNTER — Ambulatory Visit (INDEPENDENT_AMBULATORY_CARE_PROVIDER_SITE_OTHER): Payer: No Typology Code available for payment source | Admitting: Physician Assistant

## 2022-11-04 VITALS — BP 176/103 | HR 78 | Temp 98.3°F | Resp 20 | Ht 68.0 in | Wt 201.0 lb

## 2022-11-04 DIAGNOSIS — S98111A Complete traumatic amputation of right great toe, initial encounter: Secondary | ICD-10-CM | POA: Diagnosis not present

## 2022-11-04 DIAGNOSIS — Z89421 Acquired absence of other right toe(s): Secondary | ICD-10-CM

## 2022-11-04 DIAGNOSIS — I739 Peripheral vascular disease, unspecified: Secondary | ICD-10-CM

## 2022-11-04 NOTE — Progress Notes (Signed)
Office Note     CC:  follow up Requesting Provider:  Gerrit Heck, MD  HPI: Sean Carter is a 52 y.o. (01-08-1971) male who presents status post right first and second partial ray amputation on 07/22/2022 by Dr. Stanford Breed.  Surgical history also significant for ray amputation of the left toe by Dr. Carlis Abbott on 05/31/2020.  Patient and his girlfriend believe that the right foot amputation site has completely healed.  They have an appointment with [facility] today for bilateral great toe shoe inserts to help with stability.  He is planning to return to work on 11/11/2022.  He works as a Biomedical scientist at [employer].  He denies tobacco use.  He is on a daily statin.   Past Medical History:  Diagnosis Date   Blurry vision 07/10/2020   Diabetes mellitus without complication ([name])    Gangrene of toe of left foot ([place])    Sepsis due to Streptococcus, group B ([place]) 06/20/2020   Streptococcal bacteremia     Past Surgical History:  Procedure Laterality Date   AMPUTATION Left 05/31/2020   Procedure: Ray Amputation of Left Great Toe with Negative Pressure Vac Placement;  Surgeon: Marty Heck, MD;  Location: [facility] OR;  Service: Vascular;  Laterality: Left;   AMPUTATION Right 07/22/2022   Procedure: RIGHT FIRST AND SECOND RAY PARTIAL  AMPUTATION;  Surgeon: [name], MD;  Location: [facility] OR;  Service: Vascular;  Laterality: Right;    Social History   Socioeconomic History   Marital status: Married    Spouse name: Not on file   Number of children: Not on file   Years of education: Not on file   Highest education level: Not on file  Occupational History   Not on file  Tobacco Use   Smoking status: Never    Passive exposure: Never   Smokeless tobacco: Never  Vaping Use   Vaping Use: Never used  Substance and Sexual Activity   Alcohol use: Not on file   Drug use: Never   Sexual activity: Not on file  Other Topics Concern   Not on file  Social History Narrative   Not on file    Social Determinants of Health   Financial Resource Strain: Not on file  Food Insecurity: No Food Insecurity (07/23/2022)   Hunger Vital Sign    Worried About Running Out of Food in the Last Year: Never true    Ran Out of Food in the Last Year: Never true  Transportation Needs: No Transportation Needs (07/23/2022)   PRAPARE - Hydrologist (Medical): No    Lack of Transportation (Non-Medical): No  Physical Activity: Not on file  Stress: Not on file  Social Connections: Not on file  Intimate Partner Violence: Not At Risk (07/23/2022)   Humiliation, Afraid, Rape, and Kick questionnaire    Fear of Current or Ex-Partner: No    Emotionally Abused: No    Physically Abused: No    Sexually Abused: No   History reviewed. No pertinent family history.  Current Outpatient Medications  Medication Sig Dispense Refill   acetaminophen (TYLENOL) 500 MG tablet Take 500 mg by mouth every 6 (six) hours as needed.     albuterol (VENTOLIN HFA) 108 (90 Base) MCG/ACT inhaler INHALE 2 PUFFS BY MOUTH EVERY 6 HOURS AS NEEDED FOR WHEEZING FOR SHORTNESS OF BREATH 9 g 0   amLODIPine-Valsartan-HCTZ 5-160-12.5 MG TABS Take 1 tablet by mouth daily at 8 pm. 30 tablet  5   atorvastatin (LIPITOR) 40 MG tablet Take 1 tablet by mouth once daily 60 tablet 0   azithromycin (ZITHROMAX) 250 MG tablet Take 1 tablet (250 mg total) by mouth daily. Take first 2 tablets together, then 1 every day until finished. 6 tablet 0   Blood Gluc Meter Disp-Strips (RELION ALL-IN-ONE) DEVI 1 each by Does not apply route 3 (three) times daily as needed. 1 each 3   brompheniramine-pseudoephedrine-DM 30-2-10 MG/5ML syrup Take 5 mLs by mouth 4 (four) times daily as needed. 140 mL 0   cetirizine (ZYRTEC) 10 MG tablet Take 1 tablet (10 mg total) by mouth daily. 30 tablet 0   diphenhydrAMINE (BENADRYL) 25 MG tablet Take 25 mg by mouth every 6 (six) hours as needed for itching, allergies or sleep.     famotidine (PEPCID)  40 MG tablet Take 1 tablet by mouth twice daily 60 tablet 0   Insulin Glargine (BASAGLAR KWIKPEN) 100 UNIT/ML DIAL AND INJECT 28 UNITS UNDER THE SKIN ONCE DAILY IN THE MORNING. MAX DAILY DOSE 28 UNITS 30 mL 0   insulin lispro (HUMALOG) 100 UNIT/ML injection Inject 0.08 mLs (8 Units total) into the skin 3 (three) times daily before meals. 10 mL 2   loratadine (CLARITIN) 10 MG tablet Take 1 tablet (10 mg total) by mouth daily.     montelukast (SINGULAIR) 10 MG tablet TAKE 1 TABLET BY MOUTH AT BEDTIME 30 tablet 0   Multiple Vitamin (MULTIVITAMIN) tablet Take 1 tablet by mouth daily.     ReliOn Lancet Devices 30G MISC 1 each by Does not apply route 3 (three) times daily as needed. 1 each 3   ReliOn Lancets Thin 26G MISC 1 each by Does not apply route 3 (three) times daily as needed. 1 each 3   traMADol (ULTRAM) 50 MG tablet Take 1 tablet (50 mg total) by mouth every 6 (six) hours as needed for severe pain. 20 tablet 0   ULTICARE INSULIN SYRINGE 30G X 1/2" 0.3 ML MISC See admin instructions.      No current facility-administered medications for this visit.    Allergies  Allergen Reactions   Scallops [Shellfish Allergy] Anaphylaxis   Penicillins Other (See Comments)    Unknown reaction as a child     REVIEW OF SYSTEMS:   [X]  denotes positive finding, [ ]  denotes negative finding Cardiac  Comments:  Chest pain or chest pressure:    Shortness of breath upon exertion:    Short of breath when lying flat:    Irregular heart rhythm:        Vascular    Pain in calf, thigh, or hip brought on by ambulation:    Pain in feet at night that wakes you up from your sleep:     Blood clot in your veins:    Leg swelling:         Pulmonary    Oxygen at home:    Productive cough:     Wheezing:         Neurologic    Sudden weakness in arms or legs:     Sudden numbness in arms or legs:     Sudden onset of difficulty speaking or slurred speech:    Temporary loss of vision in one eye:     Problems  with dizziness:         Gastrointestinal    Blood in stool:     Vomited blood:         Genitourinary  Burning when urinating:     Blood in urine:        Psychiatric    Major depression:         Hematologic    Bleeding problems:    Problems with blood clotting too easily:        Skin    Rashes or ulcers:        Constitutional    Fever or chills:      PHYSICAL EXAMINATION:  Vitals:   11/04/22 1021  BP: (!) 176/103  Pulse: 78  Resp: 20  Temp: 98.3 F (36.8 C)  SpO2: 99%  Weight: 201 lb (91.2 kg)  Height: 5\' 8"  (1.727 m)    General:  WDWN in NAD; vital signs documented above Gait: Not observed HENT: WNL, normocephalic Pulmonary: normal non-labored breathing , without Rales, rhonchi,  wheezing Cardiac: regular HR Abdomen: soft, NT, no masses Skin: without rashes Vascular Exam/Pulses: Palpable right DP pulse Extremities: Dry eschar removed from amp site of right foot with healthy tissue below, no purulence, no surrounding erythema Musculoskeletal: no muscle wasting or atrophy  Neurologic: A&O X 3;  No focal weakness or paresthesias are detected Psychiatric:  The pt has Normal affect.   ASSESSMENT/PLAN:: 52 y.o. male here for follow up for wound check  -Dry eschar removed from amp site of right foot.  Healthy tissue underneath.  No purulence or erythema surrounding this area suggest infection.  This is very superficial and likely only represents the outer skin layer.  Okay from our standpoint to wear shoe inserts for bilateral great toes.  Okay to return to work on 11/11/2022.  We will plan to follow patient annually with ABIs.  He knows to call/return to office sooner with any questions or concerns.   11/13/2022, PA-C [facility] [phone]  Clinic MD:   [phone]

## 2022-11-05 ENCOUNTER — Other Ambulatory Visit: Payer: Self-pay

## 2022-11-06 MED ORDER — CETIRIZINE HCL 10 MG PO TABS: 10.0000 mg | ORAL_TABLET | Freq: Every day | ORAL | 3 refills | Status: DC

## 2022-11-10 ENCOUNTER — Telehealth: Payer: Self-pay

## 2022-11-10 NOTE — Telephone Encounter (Addendum)
Pt called explaining that he was told at his last office visit that he could return to work on 11/11/22, but he wanted to go back on 11/10/22. When asked if he could get a generic work note from [facility], he stated that HR needed a specific form filled out. Explained that Devol, CMA, who had been helping him with his benefits requests, was not in the office and I did not have access to her email or completed work. Informed pt that Geraldine [employer] would be contacted to see what this office can do for him. Confirmed understanding.  Spoke with [place] who stated that he would need to get this form and either fax it to this office or hand deliver it. Emailing from either HR or himself will NOT work.

## 2022-11-24 ENCOUNTER — Telehealth: Payer: Self-pay | Admitting: Student

## 2022-11-24 NOTE — Telephone Encounter (Signed)
Gave form to doctor while in clinic this afternoon.

## 2022-11-24 NOTE — Telephone Encounter (Signed)
Patient called stating he has misplaced handicap form. I am placing a new one in the [facility] team folder.   Please advise.   Thanks!

## 2022-11-25 ENCOUNTER — Other Ambulatory Visit: Payer: Self-pay | Admitting: Student

## 2022-11-25 DIAGNOSIS — R0982 Postnasal drip: Secondary | ICD-10-CM

## 2022-11-27 NOTE — Telephone Encounter (Signed)
Patient called and informed that forms are ready for pick up. Copy made and placed in batch scanning. Original placed at front desk for pick up.   Audreyana Huntsberry C Taevyn Hausen, RN  

## 2022-11-30 ENCOUNTER — Other Ambulatory Visit: Payer: Self-pay | Admitting: Student

## 2022-11-30 DIAGNOSIS — R198 Other specified symptoms and signs involving the digestive system and abdomen: Secondary | ICD-10-CM

## 2022-12-01 ENCOUNTER — Other Ambulatory Visit: Payer: Self-pay | Admitting: Student

## 2022-12-01 DIAGNOSIS — R0982 Postnasal drip: Secondary | ICD-10-CM

## 2022-12-10 ENCOUNTER — Telehealth: Payer: Self-pay

## 2022-12-10 NOTE — Telephone Encounter (Signed)
Patient called and LVM stating since he started back working he has been experiencing pain from a blister that has developed. 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   I returned pts call to clarify. Pt stated the area has been on his foot for a while now and the blister comes and goes. He stated his foot feels better when he wraps a kerlix around it before activity. I asked if he had any malodorous smells and/or thick, puss like excessive drainage. Mr. Ledman stated he hasn't noticed any drainage and as of right now the area is sore because the blister popped. I advised to keep the area dry and to wrap the foot in Kerlix to keep protected while walking and standing during work. I also recommended he gets a shoe insole that will help cushion that area that is painful. I advised if his foot get worse to contact our office to schedule an appt to be seen. Patient voiced his understanding.

## 2022-12-18 ENCOUNTER — Ambulatory Visit: Payer: No Typology Code available for payment source

## 2022-12-19 ENCOUNTER — Other Ambulatory Visit: Payer: Self-pay | Admitting: Family Medicine

## 2022-12-23 ENCOUNTER — Other Ambulatory Visit: Payer: Self-pay | Admitting: Student

## 2022-12-23 DIAGNOSIS — E785 Hyperlipidemia, unspecified: Secondary | ICD-10-CM

## 2022-12-30 ENCOUNTER — Other Ambulatory Visit: Payer: Self-pay | Admitting: Student

## 2022-12-30 DIAGNOSIS — R198 Other specified symptoms and signs involving the digestive system and abdomen: Secondary | ICD-10-CM

## 2022-12-31 ENCOUNTER — Other Ambulatory Visit: Payer: Self-pay | Admitting: Student

## 2022-12-31 DIAGNOSIS — R0982 Postnasal drip: Secondary | ICD-10-CM

## 2023-01-05 ENCOUNTER — Telehealth: Payer: Self-pay

## 2023-01-05 NOTE — Telephone Encounter (Signed)
Pt called c/o swollen foot after working 12 hr shifts daily and a blister on the bottom of his foot.  Reviewed pt's chart, returned call for clarification, two identifiers used. Pt stated that Dr. Stanford Breed informed him he may have a blister from the adjustment to walking after the toe amputations. He is wrapping it for protection and it is improving. Instructed pt on proper elevation since pt has not been doing this at night. Encouraged pt to drink more water to help as well. Pt has no other complaints. Confirmed understanding.

## 2023-01-15 ENCOUNTER — Other Ambulatory Visit: Payer: Self-pay

## 2023-01-15 ENCOUNTER — Ambulatory Visit
Admission: EM | Admit: 2023-01-15 | Discharge: 2023-01-15 | Disposition: A | Discharge status: Home / Self Care | Payer: Self-pay | Attending: Family Medicine | Admitting: Family Medicine

## 2023-01-15 ENCOUNTER — Inpatient Hospital Stay (HOSPITAL_COMMUNITY)
Admission: EM | Admit: 2023-01-15 | Discharge: 2023-01-26 | DRG: 854 | Disposition: A | Discharge status: Home / Self Care | Payer: Self-pay | Attending: Family Medicine | Admitting: Family Medicine

## 2023-01-15 ENCOUNTER — Encounter (HOSPITAL_COMMUNITY): Payer: Self-pay

## 2023-01-15 ENCOUNTER — Encounter: Payer: Self-pay | Admitting: Emergency Medicine

## 2023-01-15 ENCOUNTER — Ambulatory Visit (INDEPENDENT_AMBULATORY_CARE_PROVIDER_SITE_OTHER): Payer: Self-pay

## 2023-01-15 DIAGNOSIS — R509 Fever, unspecified: Secondary | ICD-10-CM

## 2023-01-15 DIAGNOSIS — R000 Tachycardia, unspecified: Secondary | ICD-10-CM

## 2023-01-15 DIAGNOSIS — Z89429 Acquired absence of other toe(s), unspecified side: Secondary | ICD-10-CM

## 2023-01-15 DIAGNOSIS — R059 Cough, unspecified: Secondary | ICD-10-CM | POA: Diagnosis present

## 2023-01-15 DIAGNOSIS — E1151 Type 2 diabetes mellitus with diabetic peripheral angiopathy without gangrene: Secondary | ICD-10-CM | POA: Diagnosis present

## 2023-01-15 DIAGNOSIS — R051 Acute cough: Secondary | ICD-10-CM

## 2023-01-15 DIAGNOSIS — N179 Acute kidney failure, unspecified: Secondary | ICD-10-CM

## 2023-01-15 DIAGNOSIS — L03115 Cellulitis of right lower limb: Principal | ICD-10-CM

## 2023-01-15 DIAGNOSIS — E1159 Type 2 diabetes mellitus with other circulatory complications: Secondary | ICD-10-CM | POA: Diagnosis present

## 2023-01-15 DIAGNOSIS — E1169 Type 2 diabetes mellitus with other specified complication: Secondary | ICD-10-CM

## 2023-01-15 DIAGNOSIS — A419 Sepsis, unspecified organism: Principal | ICD-10-CM | POA: Diagnosis present

## 2023-01-15 DIAGNOSIS — E871 Hypo-osmolality and hyponatremia: Secondary | ICD-10-CM | POA: Diagnosis present

## 2023-01-15 DIAGNOSIS — S91309A Unspecified open wound, unspecified foot, initial encounter: Secondary | ICD-10-CM

## 2023-01-15 DIAGNOSIS — E11621 Type 2 diabetes mellitus with foot ulcer: Secondary | ICD-10-CM

## 2023-01-15 DIAGNOSIS — E11622 Type 2 diabetes mellitus with other skin ulcer: Secondary | ICD-10-CM | POA: Diagnosis present

## 2023-01-15 DIAGNOSIS — L089 Local infection of the skin and subcutaneous tissue, unspecified: Secondary | ICD-10-CM

## 2023-01-15 DIAGNOSIS — Z91013 Allergy to seafood: Secondary | ICD-10-CM

## 2023-01-15 DIAGNOSIS — R5383 Other fatigue: Secondary | ICD-10-CM

## 2023-01-15 DIAGNOSIS — J984 Other disorders of lung: Secondary | ICD-10-CM

## 2023-01-15 DIAGNOSIS — L97519 Non-pressure chronic ulcer of other part of right foot with unspecified severity: Secondary | ICD-10-CM | POA: Diagnosis present

## 2023-01-15 DIAGNOSIS — I152 Hypertension secondary to endocrine disorders: Secondary | ICD-10-CM | POA: Diagnosis present

## 2023-01-15 DIAGNOSIS — Z794 Long term (current) use of insulin: Secondary | ICD-10-CM

## 2023-01-15 DIAGNOSIS — E1165 Type 2 diabetes mellitus with hyperglycemia: Secondary | ICD-10-CM | POA: Diagnosis present

## 2023-01-15 DIAGNOSIS — M869 Osteomyelitis, unspecified: Secondary | ICD-10-CM

## 2023-01-15 DIAGNOSIS — R7401 Elevation of levels of liver transaminase levels: Secondary | ICD-10-CM | POA: Insufficient documentation

## 2023-01-15 DIAGNOSIS — K219 Gastro-esophageal reflux disease without esophagitis: Secondary | ICD-10-CM | POA: Diagnosis present

## 2023-01-15 DIAGNOSIS — E11628 Type 2 diabetes mellitus with other skin complications: Secondary | ICD-10-CM | POA: Diagnosis present

## 2023-01-15 DIAGNOSIS — Z89422 Acquired absence of other left toe(s): Secondary | ICD-10-CM

## 2023-01-15 DIAGNOSIS — Z22330 Carrier of Group B streptococcus: Secondary | ICD-10-CM

## 2023-01-15 DIAGNOSIS — Z89421 Acquired absence of other right toe(s): Secondary | ICD-10-CM

## 2023-01-15 DIAGNOSIS — M79671 Pain in right foot: Secondary | ICD-10-CM

## 2023-01-15 DIAGNOSIS — E785 Hyperlipidemia, unspecified: Secondary | ICD-10-CM | POA: Diagnosis present

## 2023-01-15 DIAGNOSIS — Z79899 Other long term (current) drug therapy: Secondary | ICD-10-CM

## 2023-01-15 LAB — CBC WITH DIFFERENTIAL/PLATELET
Abs Immature Granulocytes: 0.05 10*3/uL (ref 0.00–0.07)
Basophils Absolute: 0 10*3/uL (ref 0.0–0.1)
Basophils Relative: 0 %
Eosinophils Absolute: 0 10*3/uL (ref 0.0–0.5)
Eosinophils Relative: 0 %
HCT: 34.2 % — ABNORMAL LOW (ref 39.0–52.0)
Hemoglobin: 11.1 g/dL — ABNORMAL LOW (ref 13.0–17.0)
Immature Granulocytes: 0 %
Lymphocytes Relative: 8 %
Lymphs Abs: 1.1 10*3/uL (ref 0.7–4.0)
MCH: 28.5 pg (ref 26.0–34.0)
MCHC: 32.5 g/dL (ref 30.0–36.0)
MCV: 87.9 fL (ref 80.0–100.0)
Monocytes Absolute: 1.1 10*3/uL — ABNORMAL HIGH (ref 0.1–1.0)
Monocytes Relative: 8 %
Neutro Abs: 10.9 10*3/uL — ABNORMAL HIGH (ref 1.7–7.7)
Neutrophils Relative %: 84 %
Platelets: 417 10*3/uL — ABNORMAL HIGH (ref 150–400)
RBC: 3.89 MIL/uL — ABNORMAL LOW (ref 4.22–5.81)
RDW: 12.6 % (ref 11.5–15.5)
WBC: 13.1 10*3/uL — ABNORMAL HIGH (ref 4.0–10.5)
nRBC: 0 % (ref 0.0–0.2)

## 2023-01-15 LAB — POCT INFLUENZA A/B
Influenza A, POC: NEGATIVE
Influenza B, POC: NEGATIVE

## 2023-01-15 LAB — GLUCOSE, CAPILLARY: Glucose-Capillary: 145 mg/dL — ABNORMAL HIGH (ref 70–99)

## 2023-01-15 LAB — LACTIC ACID, PLASMA
Lactic Acid, Venous: 1.3 mmol/L (ref 0.5–1.9)
Lactic Acid, Venous: 1.6 mmol/L (ref 0.5–1.9)

## 2023-01-15 LAB — COMPREHENSIVE METABOLIC PANEL
ALT: 27 U/L (ref 0–44)
AST: 25 U/L (ref 15–41)
Albumin: 3.2 g/dL — ABNORMAL LOW (ref 3.5–5.0)
Alkaline Phosphatase: 111 U/L (ref 38–126)
Anion gap: 13 (ref 5–15)
BUN: 28 mg/dL — ABNORMAL HIGH (ref 6–20)
CO2: 27 mmol/L (ref 22–32)
Calcium: 8.6 mg/dL — ABNORMAL LOW (ref 8.9–10.3)
Chloride: 93 mmol/L — ABNORMAL LOW (ref 98–111)
Creatinine, Ser: 1.5 mg/dL — ABNORMAL HIGH (ref 0.61–1.24)
GFR, Estimated: 56 mL/min — ABNORMAL LOW (ref >60)
Glucose, Bld: 175 mg/dL — ABNORMAL HIGH (ref 70–99)
Potassium: 3.5 mmol/L (ref 3.5–5.1)
Sodium: 133 mmol/L — ABNORMAL LOW (ref 135–145)
Total Bilirubin: 1.2 mg/dL (ref 0.3–1.2)
Total Protein: 7.5 g/dL (ref 6.5–8.1)

## 2023-01-15 MED ORDER — ATORVASTATIN CALCIUM 40 MG PO TABS
40.0000 mg | ORAL_TABLET | Freq: Every day | ORAL | Status: DC
  Administered 2023-01-16 – 2023-01-26 (×11): 40 mg via ORAL
  Filled 2023-01-15 (×11): qty 1

## 2023-01-15 MED ORDER — VANCOMYCIN HCL 750 MG/150ML IV SOLN
750.0000 mg | Freq: Once | INTRAVENOUS | Status: AC
  Administered 2023-01-16: 750 mg via INTRAVENOUS
  Filled 2023-01-15: qty 150

## 2023-01-15 MED ORDER — INSULIN ASPART 100 UNIT/ML IJ SOLN
0.0000 [IU] | Freq: Every day | INTRAMUSCULAR | Status: DC
  Administered 2023-01-17: 2 [IU] via SUBCUTANEOUS
  Administered 2023-01-18: 3 [IU] via SUBCUTANEOUS
  Administered 2023-01-19: 2 [IU] via SUBCUTANEOUS
  Administered 2023-01-22: 5 [IU] via SUBCUTANEOUS
  Administered 2023-01-23: 3 [IU] via SUBCUTANEOUS
  Administered 2023-01-25: 4 [IU] via SUBCUTANEOUS

## 2023-01-15 MED ORDER — ACETAMINOPHEN 325 MG PO TABS
650.0000 mg | ORAL_TABLET | Freq: Four times a day (QID) | ORAL | Status: DC
  Administered 2023-01-15 – 2023-01-26 (×25): 650 mg via ORAL
  Filled 2023-01-15 (×25): qty 2

## 2023-01-15 MED ORDER — GUAIFENESIN-DM 100-10 MG/5ML PO SYRP
5.0000 mL | ORAL_SOLUTION | ORAL | Status: DC | PRN
  Administered 2023-01-15 – 2023-01-17 (×5): 5 mL via ORAL
  Filled 2023-01-15 (×5): qty 5

## 2023-01-15 MED ORDER — HYDROCHLOROTHIAZIDE 12.5 MG PO TABS
12.5000 mg | ORAL_TABLET | Freq: Every day | ORAL | Status: DC
  Administered 2023-01-16 – 2023-01-26 (×11): 12.5 mg via ORAL
  Filled 2023-01-15 (×11): qty 1

## 2023-01-15 MED ORDER — ENOXAPARIN SODIUM 40 MG/0.4ML IJ SOSY: 40.0000 mg | PREFILLED_SYRINGE | INTRAMUSCULAR | Status: DC

## 2023-01-15 MED ORDER — ALBUTEROL SULFATE (2.5 MG/3ML) 0.083% IN NEBU: 2.5000 mg | INHALATION_SOLUTION | Freq: Once | RESPIRATORY_TRACT | Status: DC

## 2023-01-15 MED ORDER — ALBUTEROL SULFATE (2.5 MG/3ML) 0.083% IN NEBU
2.5000 mg | INHALATION_SOLUTION | Freq: Once | RESPIRATORY_TRACT | Status: AC
  Administered 2023-01-15: 2.5 mg via RESPIRATORY_TRACT
  Filled 2023-01-15: qty 3

## 2023-01-15 MED ORDER — ENOXAPARIN SODIUM 40 MG/0.4ML IJ SOSY
40.0000 mg | PREFILLED_SYRINGE | INTRAMUSCULAR | Status: DC
  Administered 2023-01-16 – 2023-01-17 (×2): 40 mg via SUBCUTANEOUS
  Filled 2023-01-15 (×2): qty 0.4

## 2023-01-15 MED ORDER — SODIUM CHLORIDE 0.9 % IV SOLN
2.0000 g | Freq: Once | INTRAVENOUS | Status: AC
  Administered 2023-01-15: 2 g via INTRAVENOUS
  Filled 2023-01-15: qty 12.5

## 2023-01-15 MED ORDER — AMLODIPINE BESYLATE 5 MG PO TABS
5.0000 mg | ORAL_TABLET | Freq: Every day | ORAL | Status: DC
  Administered 2023-01-16 – 2023-01-26 (×11): 5 mg via ORAL
  Filled 2023-01-15 (×11): qty 1

## 2023-01-15 MED ORDER — AMLODIPINE-VALSARTAN-HCTZ 5-160-12.5 MG PO TABS: 1.0000 | ORAL_TABLET | Freq: Every day | ORAL | Status: DC

## 2023-01-15 MED ORDER — ACETAMINOPHEN 500 MG PO TABS
1000.0000 mg | ORAL_TABLET | Freq: Once | ORAL | Status: AC
  Administered 2023-01-15: 1000 mg via ORAL

## 2023-01-15 MED ORDER — FAMOTIDINE 20 MG PO TABS
40.0000 mg | ORAL_TABLET | Freq: Two times a day (BID) | ORAL | Status: DC
  Administered 2023-01-15 – 2023-01-26 (×21): 40 mg via ORAL
  Filled 2023-01-15 (×21): qty 2

## 2023-01-15 MED ORDER — VANCOMYCIN HCL IN DEXTROSE 1-5 GM/200ML-% IV SOLN
1000.0000 mg | Freq: Once | INTRAVENOUS | Status: AC
  Administered 2023-01-15: 1000 mg via INTRAVENOUS
  Filled 2023-01-15: qty 200

## 2023-01-15 MED ORDER — IRBESARTAN 150 MG PO TABS: 150.0000 mg | ORAL_TABLET | Freq: Every day | ORAL | Status: DC

## 2023-01-15 MED ORDER — OXYCODONE HCL 5 MG PO TABS
5.0000 mg | ORAL_TABLET | Freq: Four times a day (QID) | ORAL | Status: DC | PRN
  Administered 2023-01-15: 5 mg via ORAL
  Filled 2023-01-15: qty 1

## 2023-01-15 MED ORDER — ALBUTEROL SULFATE (2.5 MG/3ML) 0.083% IN NEBU: 2.5000 mg | INHALATION_SOLUTION | Freq: Four times a day (QID) | RESPIRATORY_TRACT | Status: DC | PRN

## 2023-01-15 MED ORDER — DIPHENHYDRAMINE HCL 25 MG PO CAPS
25.0000 mg | ORAL_CAPSULE | Freq: Once | ORAL | Status: AC
  Administered 2023-01-15: 25 mg via ORAL
  Filled 2023-01-15: qty 1

## 2023-01-15 MED ORDER — INSULIN ASPART 100 UNIT/ML IJ SOLN
0.0000 [IU] | Freq: Three times a day (TID) | INTRAMUSCULAR | Status: DC
  Administered 2023-01-16: 3 [IU] via SUBCUTANEOUS
  Administered 2023-01-16: 2 [IU] via SUBCUTANEOUS
  Administered 2023-01-16: 3 [IU] via SUBCUTANEOUS
  Administered 2023-01-17: 5 [IU] via SUBCUTANEOUS
  Administered 2023-01-17: 3 [IU] via SUBCUTANEOUS
  Administered 2023-01-17: 8 [IU] via SUBCUTANEOUS
  Administered 2023-01-18 (×2): 2 [IU] via SUBCUTANEOUS
  Administered 2023-01-19: 3 [IU] via SUBCUTANEOUS

## 2023-01-15 MED ORDER — MONTELUKAST SODIUM 10 MG PO TABS
10.0000 mg | ORAL_TABLET | Freq: Every day | ORAL | Status: DC
  Administered 2023-01-15 – 2023-01-25 (×11): 10 mg via ORAL
  Filled 2023-01-15 (×11): qty 1

## 2023-01-15 NOTE — ED Notes (Addendum)
Messaged provider regarding elevated temperature. Awaiting orders.

## 2023-01-15 NOTE — Assessment & Plan Note (Addendum)
Stable. -Continue home Amlodipine 5 mg / HCTZ 12.5 mg -Consider adding back ARB if continues to rise

## 2023-01-15 NOTE — Assessment & Plan Note (Addendum)
A1c 7.2. Home meds Basaglar 28U daily and Humalog 8U TID. CBG worsened recently with recent Dexamethasone. Glargine increase will benefit him, may just need further mealtime dosing via sliding scale. Would benefit from CBGs<180-200 given active wound/infection. -Continue rSSI with QHS coverage as well as meal coverage -Continue home Semglee dose at 28u -CBG checks

## 2023-01-15 NOTE — H&P (Cosign Needed)
Hospital Admission History and Physical Service Pager: [phone]  Patient name: Sean Carter Medical record number: [id number] Date of Birth: 1971-05-27 Age: 52 y.o. Gender: male  Primary Care Provider: Gerrit Heck, MD Consultants: [facility] Code Status: Full Code  Preferred Emergency Contact:  Contact Information     Name Relation Home Work Mobile   Sean Carter other [phone]          Chief Complaint: R foot wound  Assessment and Plan: Sean Carter is a 52 y.o. male presenting with worsening R diabetic foot ulcer. Differential for this patient's presentation of this includes cellulitis (XR confirmed soft tissue swelling and gas formation), osteomyelitis (no evidence of osteo on XR) and septic joint (ulceration with erythema and warmth extending up the R LE)  * Sepsis ([name]) Febrile, tachycardic and leukocytosis upon admission meeting SIRS criteria with likely source of diabetic R foot wound with purulent drainage. XR confirm soft tissue infection and gas of R foot. Started on Vancomycin in the ED. [facility] consulted and recommended admission for IV antibiotics and observation over the weekend with likely intervention on 4/1 if not improving. Recommended against further imaging at this time. Upon exam, R foot with significant erythema, edema and warm with half-dollar size ulcer leaking purulent fluid. Erythema and warmth extend up L calf concerning for extended infection. -Admitted to [facility] service med tele, Dr. Erin Carter attending -[facility] consulted, appreciate recs -Add Cefepime for broader coverage and continue Vancomycin  -BC (obtained after initiation of Vancomycin by ED) -Pain regimen: Tylenol 650mg  q6h sch, Oxycodone 5mg  q6h prn -Trend fever curve -Cardiac monitoring -Consult PT/OT as needed when infection improved -Vital per routine  Hypertension associated with diabetes ([place]) Mildly hypertensive upon admission but had not taken medication today.  Compliance on Amlodipine-Valsartan-HCTZ combo pill. -Restart formulary alternative to home med  Restrictive lung disease Mild restrictive lung disease with response to albuterol on PFTs in 08/2022. Currently endorses persistent, non-productive cough. No wheezing noted. CXR negative for acute process. -Albuterol neb once then q6h prn -Continue home Montelukast  Diabetes mellitus type 2 with peripheral artery disease ([name]) A1c 6.8 in 06/2022. Compliant on Basaglar 28U daily and Humalog 8U TID. -mSSI + qhs coverage -CBG checks -F/u A1c   Chronic conditions: HLD: continue Lipitor GERD: continue Pepcid  FEN/GI: Carb modified diet VTE Prophylaxis: Lovenox  Disposition: Med tele  History of Present Illness:  [name] is a 52 y.o. male presenting with R foot ulcer with purulent drainage. Wife present via phone and provided additional information.   Wife has been wrapping patient's foot everyday due to chronic blister. Noted Wednesday there was blister with increasing redness on the R foot. Progressed to a tiny hole on Thursday. Today (Friday) patient woke up feeling ill with fever and nausea. Reports the redness and swelling was much worse and now had purulent drainage from the ulcer site. Reports pain started started Monday and has progressively worsened over the course of the week. 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 States he worked 12+ hour all week until today. Denies any CP, SOB, and was otherwise in normal health.   Sees [facility], Dr. Edison Carter for outpatient management. S/p R 1st and 2nd ray amputation on 07/22/2022. Was told to keep foot blister wrapped. States he has had swelling in his R leg since that surgery   In the ED, XR showed soft tissue  infection and gas without evidence of osteomyelitis. [facility] curbsided and recommended admission of IV antibiotics.  Review Of Systems: Per HPI above  Pertinent Past Medical  History: T2DM Sepsis L foot gangrene PAD HTN Remainder reviewed in history tab.   Pertinent Past Surgical History: L toe amputation R 1st and 2nd toe amputation Remainder reviewed in history tab.   Pertinent Social History: Tobacco use: None Alcohol use: None Other Substance use: None Lives with Wife Sean Carter)  Pertinent Family History: None pertinent Remainder reviewed in history tab.   Important Outpatient Medications: Albuterol prn Amlodipine-Valsartan-HCTZ qhs Lipitor 40mg  daily Zyretec 10mg  - not taking Pepcid 40mg  BID  Humalog 8U TID - has not had today Basaglar 28U daily - took today Singulair 10mg  - takes at night Claratin 10mg  - not taking Tramadol 50mg  q6h - not taking Takes multivitamin Takes benadryl at night for allergies Remainder reviewed in medication history.   Objective: BP 138/82 (BP Location: Left Arm)   Pulse 96   Temp (!) 102.1 F (38.9 C) (Oral)   Resp 20   Ht 5\' 8"  (1.727 m)   Wt 91.2 kg   SpO2 96%   BMI 30.56 kg/m  Exam: General: Well-appearing, alert, NAD Eyes: Anicteric sclera ENTM: Moist mucus membranes. Neck: Supple, non-tender Cardiovascular: RRR without murmur Respiratory: CTAB. Normal WOB on RA Gastrointestinal: Soft, obese abdomen, non-tender MSK: R foot with amputation site of 1st and 2nd toe. Large ulceration on plantar surface with purulent drainage and significant surrounding erythema, edema and warm. Additional site of purulent drainage on plantar surface of 3rd digit. Erythema and warmth extending up L LE to mid calf level. Derm: Warm, dry Neuro: Moves all 4 extremities spontaneously Psych: Cooperative, pleasant  .  Labs:  CBC BMET  Recent Labs  Lab 01/15/23 1553  WBC 13.1*  HGB 11.1*  HCT 34.2*  PLT 417*   Recent Labs  Lab 01/15/23 1553  NA 133*  K 3.5  CL 93*  CO2 27  BUN 28*  CREATININE 1.50*  GLUCOSE 175*  CALCIUM 8.6*    Pertinent additional labs: Lactic acid: 1.3>1.6  EKG: Sinus  tachycardia   Imaging Studies Performed: DG Chest 2 View Result Date: 01/15/2023 IMPRESSION: Clear lungs. Compression fracture at thoracolumbar junction question subacute to old though new since 2021.  DG Foot Complete Right Result Date: 01/15/2023 IMPRESSION: Soft tissue swelling and soft tissue gas at the third toe consistent with soft tissue infection and questioned corresponding to reported ulcer. No definite radiographic evidence of osteomyelitis identified. Prior amputations through the first and second rays through the distal metatarsals.    Colletta Maryland, MD 01/15/2023, 11:30 PM PGY-1, [facility] pager: [phone], text pages welcome Secure chat group [place]

## 2023-01-15 NOTE — Assessment & Plan Note (Deleted)
Febrile, tachycardic w/ leukocytosis w/ purulent diabetic foot wound meeting sepsis criteria on admission. XR confirm soft tissue infection and gas of R foot. Blood cultures collected after initiation of abx show no growth <12 hours. WBC today is 14.7 from 17.5, afebrile overnight. [facility] following considering TMA vs BKA pending angiogram 4/1.  -Continue Vancomycin (3/29-) -Continue Cefepime (3/29-) -Vascular surgery following, appreciate recs -Vitals per unit -Cardiac monitoring -F/u Bcx -Trend fever curve -AM CBC, BMP

## 2023-01-15 NOTE — Consult Note (Signed)
[facility] [place]  Patient name: Sean Carter MRN: [id number] DOB: 04-28-1971 Sex: male   REQUESTING PROVIDER:   ER   REASON FOR CONSULT:    Right foot wound  HISTORY OF PRESENT ILLNESS:   Sean Carter is a 52 y.o. male, who is well-known to our service having undergone amputation of his right great toe in 2021, and amputation of the right second toe in 2023.  He was recently seen in our office with a chronic wound.  He now returns to the hospital with a foul-smelling draining right third toe and amputation site.  We are asked to evaluate his foot.  The patient works as a Animal nutritionist.  He is a diabetic.  He is a non-smoker.  PAST MEDICAL HISTORY    Past Medical History:  Diagnosis Date   Blurry vision 07/10/2020   Diabetes mellitus without complication ([place])    Gangrene of toe of left foot ([place])    Sepsis due to Streptococcus, group B ([place]) 06/20/2020   Streptococcal bacteremia      FAMILY HISTORY   History reviewed. No pertinent family history.  SOCIAL HISTORY:   Social History   Socioeconomic History   Marital status: Married    Spouse name: Not on file   Number of children: Not on file   Years of education: Not on file   Highest education level: Not on file  Occupational History   Not on file  Tobacco Use   Smoking status: Never    Passive exposure: Never   Smokeless tobacco: Never  Vaping Use   Vaping Use: Never used  Substance and Sexual Activity   Alcohol use: Not on file   Drug use: Never   Sexual activity: Not on file  Other Topics Concern   Not on file  Social History Narrative   Not on file   Social Determinants of Health   Financial Resource Strain: Not on file  Food Insecurity: No Food Insecurity (07/23/2022)   Hunger Vital Sign    Worried About Running Out of Food in the Last Year: Never true    Ran Out of Food in the Last Year: Never true  Transportation  Needs: No Transportation Needs (07/23/2022)   PRAPARE - Hydrologist (Medical): No    Lack of Transportation (Non-Medical): No  Physical Activity: Not on file  Stress: Not on file  Social Connections: Not on file  Intimate Partner Violence: Not At Risk (07/23/2022)   Humiliation, Afraid, Rape, and Kick questionnaire    Fear of Current or Ex-Partner: No    Emotionally Abused: No    Physically Abused: No    Sexually Abused: No    ALLERGIES:    Allergies  Allergen Reactions   Scallops [Shellfish Allergy] Anaphylaxis   Penicillins Other (See Comments)    Unknown reaction as a child    CURRENT MEDICATIONS:    Current Facility-Administered Medications  Medication Dose Route Frequency Provider Last Rate Last Admin   acetaminophen (TYLENOL) tablet 650 mg  650 mg Oral Q6H Colletta Maryland, MD   650 mg at 01/15/23 2323   albuterol (PROVENTIL) (2.5 MG/3ML) 0.083% nebulizer solution 2.5 mg  2.5 mg Nebulization Q6H PRN Colletta Maryland, MD       albuterol (PROVENTIL) (2.5 MG/3ML) 0.083% nebulizer solution 2.5 mg  2.5 mg Nebulization Once Colletta Maryland, MD       amLODipine (NORVASC) tablet 5 mg  5 mg  Oral Daily Ventura Sellers, RPH       And   hydrochlorothiazide (HYDRODIURIL) tablet 12.5 mg  12.5 mg Oral Daily Ventura Sellers, RPH       And   irbesartan (AVAPRO) tablet 150 mg  150 mg Oral Daily Ventura Sellers, [facility]       [START ON 01/16/2023] atorvastatin (LIPITOR) tablet 40 mg  40 mg Oral Daily Colletta Maryland, MD       ceFEPIme (MAXIPIME) 2 g in sodium chloride 0.9 % 100 mL IVPB  2 g Intravenous Once Laren Everts, RPH       [START ON 01/16/2023] enoxaparin (LOVENOX) injection 40 mg  40 mg Subcutaneous Q24H Colletta Maryland, MD       famotidine (PEPCID) tablet 40 mg  40 mg Oral BID Colletta Maryland, MD   40 mg at 01/15/23 2324   [START ON 01/16/2023] insulin aspart (novoLOG) injection 0-15 Units  0-15 Units Subcutaneous TID WC Colletta Maryland, MD        insulin aspart (novoLOG) injection 0-5 Units  0-5 Units Subcutaneous QHS Colletta Maryland, MD       montelukast (SINGULAIR) tablet 10 mg  10 mg Oral QHS Colletta Maryland, MD   10 mg at 01/15/23 2324   oxyCODONE (Oxy IR/ROXICODONE) immediate release tablet 5 mg  5 mg Oral Q6H PRN Colletta Maryland, MD       vancomycin (VANCOREADY) IVPB 750 mg/150 mL  750 mg Intravenous Once Bryk, Veronda P, RPH        REVIEW OF SYSTEMS:   [X]  denotes positive finding, [ ]  denotes negative finding Cardiac  Comments:  Chest pain or chest pressure:    Shortness of breath upon exertion:    Short of breath when lying flat:    Irregular heart rhythm:        Vascular    Pain in calf, thigh, or hip brought on by ambulation:    Pain in feet at night that wakes you up from your sleep:     Blood clot in your veins:    Leg swelling:         Pulmonary    Oxygen at home:    Productive cough:     Wheezing:         Neurologic    Sudden weakness in arms or legs:     Sudden numbness in arms or legs:     Sudden onset of difficulty speaking or slurred speech:    Temporary loss of vision in one eye:     Problems with dizziness:         Gastrointestinal    Blood in stool:      Vomited blood:         Genitourinary    Burning when urinating:     Blood in urine:        Psychiatric    Major depression:         Hematologic    Bleeding problems:    Problems with blood clotting too easily:        Skin    Rashes or ulcers:        Constitutional    Fever or chills:     PHYSICAL EXAM:   Vitals:   01/15/23 2041 01/15/23 2043 01/15/23 2200 01/15/23 2309  BP: (!) 142/79   138/82  Pulse: (!) 104   96  Resp: 18   20  Temp:  (!) 101.8 F (38.8 C)  (!) 102.1 F (  38.9 C)  TempSrc:  Oral  Oral  SpO2: 96%   96%  Weight:   91.2 kg   Height:   5\' 8"  (1.727 m)     GENERAL: The patient is a well-nourished male, in no acute distress. The vital signs are documented above. CARDIAC: There is a regular rate and  rhythm.  VASCULAR: Right leg edema with cellulitis.  Brisk Doppler signals. PULMONARY: Nonlabored respirations ABDOMEN: Soft and non-tender with normal pitched bowel sounds.  MUSCULOSKELETAL: There are no major deformities or cyanosis. NEUROLOGIC: No focal weakness or paresthesias are detected. SKIN: See photo below. PSYCHIATRIC: The patient has a normal affect.     STUDIES:   Right foot x-rays: Soft tissue swelling and soft tissue gas at the third toe consistent with soft tissue infection and questioned corresponding to reported ulcer.   No definite radiographic evidence of osteomyelitis identified.   Prior amputations through the first and second rays through the distal metatarsals.  ASSESSMENT and PLAN   Diabetic right foot infection: The patient has been dealing with issues in his right foot secondary to poorly controlled diabetes since 2001.  He now comes in with an infected right third toe with drainage and erythema.  I discussed with the patient that he is likely going to require a transmetatarsal amputation.  However, before doing this I would like to get formal evaluation of his blood flow.  He has never had an arteriogram.  Therefore I am scheduling him for an angiogram on Monday.  This will help determine the level of his next amputation and to see if he has adequate perfusion to heal a TMA.  In the meantime, he should receive broad-spectrum antibiotics and strict blood glucose control.  He will need to be n.p.o. after midnight on Sunday for angiogram on Monday.  Should his clinical condition deteriorate, he would require more urgent debridement of his foot   Annamarie Major, IV, MD, FACS [facility] [phone] Pager [phone]

## 2023-01-15 NOTE — Assessment & Plan Note (Addendum)
Mild restrictive lung disease with response to albuterol on PFTs in 08/2022. Continues to have mild cough. No wheezing noted. CXR negative for acute process. -Albuterol q6h prn -Continue home Montelukast -Tessalon perles prn -Continue claritin daily

## 2023-01-15 NOTE — ED Provider Notes (Signed)
[facility] Provider Note   CSN: [id number] Arrival date & time: 01/15/23  1543     History  Chief Complaint  Patient presents with   Rt foot infection    Sean Carter is a 51 y.o. male.  52 year old male presents with pain and drainage to his right foot.  History of prior amputation there.  States that he has had ulcer on the bottom of his foot for some time.  Started having a fever with shaking chills today.  Also noted purulent drainage as well to.  Went to urgent care and had an x-ray which did show gas in the tissues.  Was sent here for further management       Home Medications Prior to Admission medications   Medication Sig Start Date End Date Taking? Authorizing Provider  acetaminophen (TYLENOL) 500 MG tablet Take 500 mg by mouth every 6 (six) hours as needed.    [provider]  albuterol (VENTOLIN HFA) 108 (90 Base) MCG/ACT inhaler INHALE 2 PUFFS BY MOUTH EVERY 6 HOURS AS NEEDED FOR WHEEZING OR SHORTNESS OF BREATH 11/25/22   Gerrit Heck, MD  amLODIPine-Valsartan-HCTZ 5-160-12.5 MG TABS Take 1 tablet by mouth daily at 8 pm. 09/08/22   Zenia Resides, MD  atorvastatin (LIPITOR) 40 MG tablet Take 1 tablet by mouth once daily 12/24/22   Dameron, Luna Fuse, DO  azithromycin (ZITHROMAX) 250 MG tablet Take 1 tablet (250 mg total) by mouth daily. Take first 2 tablets together, then 1 every day until finished. 10/10/22   Leath-Warren, Alda Lea, NP  Blood Gluc Meter Disp-Strips (RELION ALL-IN-ONE) DEVI 1 each by Does not apply route 3 (three) times daily as needed. 07/02/20   Brimage, Ronnette Juniper, DO  brompheniramine-pseudoephedrine-DM 30-2-10 MG/5ML syrup Take 5 mLs by mouth 4 (four) times daily as needed. 10/10/22   Leath-Warren, Alda Lea, NP  cetirizine (ZYRTEC) 10 MG tablet Take 1 tablet (10 mg total) by mouth daily. 11/06/22   Gerrit Heck, MD  diphenhydrAMINE (BENADRYL) 25 MG tablet Take 25 mg by mouth every 6 (six) hours  as needed for itching, allergies or sleep.    [provider]  famotidine (PEPCID) 40 MG tablet Take 1 tablet by mouth twice daily 12/30/22   Gerrit Heck, MD  HUMALOG KWIKPEN 100 UNIT/ML KwikPen DIAL AND INJECT 8 UNITS UNDER THE SKIN THREE TIMES A DAY. MAX DAILY DOSE IS 24 UNITS. 12/21/22   Dameron, Luna Fuse, DO  Insulin Glargine (BASAGLAR KWIKPEN) 100 UNIT/ML DIAL AND INJECT 28 UNITS UNDER THE SKIN ONCE DAILY IN THE MORNING. MAX DAILY DOSE 28 UNITS 10/16/22   Gerrit Heck, MD  insulin lispro (HUMALOG) 100 UNIT/ML injection Inject 0.08 mLs (8 Units total) into the skin 3 (three) times daily before meals. 11/20/21   Lyndee Hensen, DO  loratadine (CLARITIN) 10 MG tablet Take 1 tablet (10 mg total) by mouth daily. 09/07/22   Zenia Resides, MD  montelukast (SINGULAIR) 10 MG tablet TAKE 1 TABLET BY MOUTH AT BEDTIME 12/31/22   Gerrit Heck, MD  Multiple Vitamin (MULTIVITAMIN) tablet Take 1 tablet by mouth daily.    [provider]  ReliOn Lancet Devices 30G MISC 1 each by Does not apply route 3 (three) times daily as needed. 07/02/20   Brimage, Ronnette Juniper, DO  ReliOn Lancets Thin 26G MISC 1 each by Does not apply route 3 (three) times daily as needed. 10/04/20   Martyn Malay, MD  traMADol (ULTRAM) 50 MG tablet Take 1 tablet (50  mg total) by mouth every 6 (six) hours as needed for severe pain. 08/20/22   Dagoberto Ligas, PA-C  ULTICARE INSULIN SYRINGE 30G X 1/2" 0.3 ML MISC See admin instructions.  06/03/20   [provider]      Allergies    Scallops [shellfish allergy] and Penicillins    Review of Systems   Review of Systems  All other systems reviewed and are negative.   Physical Exam Updated Vital Signs BP 112/66 (BP Location: Right Arm)   Pulse (!) 102   Temp (!) 102.7 F (39.3 C) (Oral)   Resp 18   SpO2 95%  Physical Exam Vitals and nursing note reviewed.  Constitutional:      General: He is not in acute distress.    Appearance: Normal appearance.  He is well-developed. He is not toxic-appearing.  HENT:     Head: Normocephalic and atraumatic.  Eyes:     General: Lids are normal.     Conjunctiva/sclera: Conjunctivae normal.     Pupils: Pupils are equal, round, and reactive to light.  Neck:     Thyroid: No thyroid mass.     Trachea: No tracheal deviation.  Cardiovascular:     Rate and Rhythm: Normal rate and regular rhythm.     Heart sounds: Normal heart sounds. No murmur heard.    No gallop.  Pulmonary:     Effort: Pulmonary effort is normal. No respiratory distress.     Breath sounds: Normal breath sounds. No stridor. No decreased breath sounds, wheezing, rhonchi or rales.  Abdominal:     General: There is no distension.     Palpations: Abdomen is soft.     Tenderness: There is no abdominal tenderness. There is no rebound.  Musculoskeletal:        General: No tenderness. Normal range of motion.     Cervical back: Normal range of motion and neck supple.  Feet:     Comments: See attached photo--will be palpable pulse.  Large ulcer noted on dorsum of foot.  Erythema appreciated as well to with necrotic tissue. Skin:    General: Skin is warm and dry.     Findings: No abrasion or rash.  Neurological:     Mental Status: He is alert and oriented to person, place, and time. Mental status is at baseline.     GCS: GCS eye subscore is 4. GCS verbal subscore is 5. GCS motor subscore is 6.     Cranial Nerves: No cranial nerve deficit.     Sensory: No sensory deficit.     Motor: Motor function is intact.  Psychiatric:        Attention and Perception: Attention normal.        Speech: Speech normal.        Behavior: Behavior normal.     ED Results / Procedures / Treatments   Labs (all labs ordered are listed, but only abnormal results are displayed) Labs Reviewed  COMPREHENSIVE METABOLIC PANEL - Abnormal; Notable for the following components:      Result Value   Sodium 133 (*)    Chloride 93 (*)    Glucose, Bld 175 (*)    BUN  28 (*)    Creatinine, Ser 1.50 (*)    Calcium 8.6 (*)    Albumin 3.2 (*)    GFR, Estimated 56 (*)    All other components within normal limits  CBC WITH DIFFERENTIAL/PLATELET - Abnormal; Notable for the following components:   WBC 13.1 (*)  RBC 3.89 (*)    Hemoglobin 11.1 (*)    HCT 34.2 (*)    Platelets 417 (*)    Neutro Abs 10.9 (*)    Monocytes Absolute 1.1 (*)    All other components within normal limits  LACTIC ACID, PLASMA  LACTIC ACID, PLASMA  URINALYSIS, ROUTINE W REFLEX MICROSCOPIC    EKG None  Radiology DG Chest 2 View  Result Date: 01/15/2023 CLINICAL DATA:  RIGHT foot pain, blister, wound EXAM: CHEST - 2 VIEW COMPARISON:  07/20/2022 FINDINGS: Normal heart size, mediastinal contours, and pulmonary vascularity. Lungs clear. No pleural effusion or pneumothorax. Compression deformity at thoracolumbar junction new since 2021, presence of sclerosis however suggesting this is subacute to old. IMPRESSION: Clear lungs. Compression fracture at thoracolumbar junction question subacute to old though new since 2021. Electronically Signed   By: Lavonia Dana M.D.   On: 01/15/2023 14:53   DG Foot Complete Right  Result Date: 01/15/2023 CLINICAL DATA:  RIGHT foot pain, ulcer, fever, blister/wound EXAM: RIGHT FOOT COMPLETE - 3+ VIEW COMPARISON:  07/20/2022 FINDINGS: Interval amputation of first and second rays through the distal metatarsals. Soft tissue swelling at stump. Osseous demineralization. Narrowing of third MCP joint. Soft tissue wound at plantar aspect at the toes, favor deep to the third toe, where small foci of gas and marked soft tissue swelling are seen. No acute fracture or dislocation. No definite bone destruction identified to suggest osteomyelitis. IMPRESSION: Soft tissue swelling and soft tissue gas at the third toe consistent with soft tissue infection and questioned corresponding to reported ulcer. No definite radiographic evidence of osteomyelitis identified. Prior  amputations through the first and second rays through the distal metatarsals. Electronically Signed   By: Lavonia Dana M.D.   On: 01/15/2023 14:51    Procedures Procedures    Medications Ordered in ED Medications  vancomycin (VANCOCIN) IVPB 1000 mg/200 mL premix (has no administration in time range)    ED Course/ Medical Decision Making/ A&P                             Medical Decision Making Amount and/or Complexity of Data Reviewed Labs: ordered.  Risk Prescription drug management.   Patient's right foot with severe infection noted.  Discussed with vascular surgeon and recommends admission with IV antibiotics.  He will decide next course of treatment.  Discussed with family practice resident and patient to be admitted       Final Clinical Impression(s) / ED Diagnoses Final diagnoses:  None    Rx / DC Orders ED Discharge Orders     None         Lacretia Leigh, MD 01/15/23 2113

## 2023-01-15 NOTE — ED Notes (Signed)
Messaged Sean Carter regarding patient temp. Awaiting orders.l

## 2023-01-15 NOTE — ED Notes (Signed)
ED TO INPATIENT HANDOFF REPORT  ED Nurse Name and Phone #: Elmo Putt [phone]  S Name/Age/Gender Sean Carter 52 y.o. male Room/Bed: 031C/031C  Code Status   Code Status: Prior  Home/SNF/Other Home Patient oriented to: self, place, time, and situation Is this baseline? Yes   Triage Complete: Triage complete  Chief Complaint Sepsis [A41.9]  Triage Note Pt came in via POV d/t infection around a blister that developed under that Rt foot, that Rt foot had 2 toes amputated back in October. Pt reports fevers up to 102.9, reports pain 10/10.    Allergies Allergies  Allergen Reactions   Scallops [Shellfish Allergy] Anaphylaxis   Penicillins Other (See Comments)    Unknown reaction as a child    Level of Care/Admitting Diagnosis ED Disposition     ED Disposition  Admit   Condition  --   [place]: [facility] [[id number]]  Level of Care: Progressive [102]  Admit to Progressive based on following criteria: MULTISYSTEM THREATS such as stable sepsis, metabolic/electrolyte imbalance with or without encephalopathy that is responding to early treatment.  May place patient in observation at [facility] or [facility] if equivalent level of care is available:: No  Covid Evaluation: Asymptomatic - no recent exposure (last 10 days) testing not required  Diagnosis: Sepsis [id number]  Admitting Physician: [place], [place]  Attending Physician: Lind Covert [[id number]]          B Medical/Surgery History Past Medical History:  Diagnosis Date   Blurry vision 07/10/2020   Diabetes mellitus without complication ([place])    Gangrene of toe of left foot ([place])    Sepsis due to Streptococcus, group B ([place]) 06/20/2020   Streptococcal bacteremia    Past Surgical History:  Procedure Laterality Date   AMPUTATION Left 05/31/2020   Procedure: Ray Amputation of Left Great Toe with Negative Pressure Vac Placement;  Surgeon: Marty Heck, MD;   Location: [facility];  Service: Vascular;  Laterality: Left;   AMPUTATION Right 07/22/2022   Procedure: RIGHT FIRST AND SECOND RAY PARTIAL  AMPUTATION;  Surgeon: [name], MD;  Location: [facility];  Service: Vascular;  Laterality: Right;     A IV Location/Drains/Wounds Patient Lines/Drains/Airways Status     Active Line/Drains/Airways     Name Placement date Placement time Site Days   Peripheral IV 01/15/23 20 G Right Antecubital 01/15/23  1939  Antecubital  less than 1            Intake/Output Last 24 hours No intake or output data in the 24 hours ending 01/15/23 2132  Labs/Imaging Results for orders placed or performed during the hospital encounter of 01/15/23 (from the past 48 hour(s))  Lactic acid, plasma     Status: None   Collection Time: 01/15/23  3:53 PM  Result Value Ref Range   Lactic Acid, Venous 1.3 0.5 - 1.9 mmol/L    Comment: Performed at [facility], [address] [place], [place] [address]  Comprehensive metabolic panel     Status: Abnormal   Collection Time: 01/15/23  3:53 PM  Result Value Ref Range   Sodium 133 (L) 135 - 145 mmol/L   Potassium 3.5 3.5 - 5.1 mmol/L   Chloride 93 (L) 98 - 111 mmol/L   CO2 27 22 - 32 mmol/L   Glucose, Bld 175 (H) 70 - 99 mg/dL    Comment: Glucose reference range applies only to samples taken after fasting for at least  8 hours.   BUN 28 (H) 6 - 20 mg/dL   Creatinine, Ser 1.50 (H) 0.61 - 1.24 mg/dL   Calcium 8.6 (L) 8.9 - 10.3 mg/dL   Total Protein 7.5 6.5 - 8.1 g/dL   Albumin 3.2 (L) 3.5 - 5.0 g/dL   AST 25 15 - 41 U/L   ALT 27 0 - 44 U/L   Alkaline Phosphatase 111 38 - 126 U/L   Total Bilirubin 1.2 0.3 - 1.2 mg/dL   GFR, Estimated 56 (L) >60 mL/min    Comment: (NOTE) Calculated using the CKD-EPI Creatinine Equation (2021)    Anion gap 13 5 - 15    Comment: Performed at [facility], [place] [address], [place], [place] [address]  CBC with Differential     Status: Abnormal   Collection Time: 01/15/23  3:53  PM  Result Value Ref Range   WBC 13.1 (H) 4.0 - 10.5 K/uL   RBC 3.89 (L) 4.22 - 5.81 MIL/uL   Hemoglobin 11.1 (L) 13.0 - 17.0 g/dL   HCT 34.2 (L) 39.0 - 52.0 %   MCV 87.9 80.0 - 100.0 fL   MCH 28.5 26.0 - 34.0 pg   MCHC 32.5 30.0 - 36.0 g/dL   RDW 12.6 11.5 - 15.5 %   Platelets 417 (H) 150 - 400 K/uL   nRBC 0.0 0.0 - 0.2 %   Neutrophils Relative % 84 %   Neutro Abs 10.9 (H) 1.7 - 7.7 K/uL   Lymphocytes Relative 8 %   Lymphs Abs 1.1 0.7 - 4.0 K/uL   Monocytes Relative 8 %   Monocytes Absolute 1.1 (H) 0.1 - 1.0 K/uL   Eosinophils Relative 0 %   Eosinophils Absolute 0.0 0.0 - 0.5 K/uL   Basophils Relative 0 %   Basophils Absolute 0.0 0.0 - 0.1 K/uL   Immature Granulocytes 0 %   Abs Immature Granulocytes 0.05 0.00 - 0.07 K/uL    Comment: Performed at [facility], [place] [address] [place], [place] [address]  Lactic acid, plasma     Status: None   Collection Time: 01/15/23  7:36 PM  Result Value Ref Range   Lactic Acid, Venous 1.6 0.5 - 1.9 mmol/L    Comment: Performed at [facility] [address], [place], [place] [address]   DG Chest 2 View  Result Date: 01/15/2023 CLINICAL DATA:  RIGHT foot pain, blister, wound EXAM: CHEST - 2 VIEW COMPARISON:  07/20/2022 FINDINGS: Normal heart size, mediastinal contours, and pulmonary vascularity. Lungs clear. No pleural effusion or pneumothorax. Compression deformity at thoracolumbar junction new since 2021, presence of sclerosis however suggesting this is subacute to old. IMPRESSION: Clear lungs. Compression fracture at thoracolumbar junction question subacute to old though new since 2021. Electronically Signed   By: Lavonia Dana M.D.   On: 01/15/2023 14:53   DG Foot Complete Right  Result Date: 01/15/2023 CLINICAL DATA:  RIGHT foot pain, ulcer, fever, blister/wound EXAM: RIGHT FOOT COMPLETE - 3+ VIEW COMPARISON:  07/20/2022 FINDINGS: Interval amputation of first and second rays through the distal metatarsals. Soft tissue swelling  at stump. Osseous demineralization. Narrowing of third MCP joint. Soft tissue wound at plantar aspect at the toes, favor deep to the third toe, where small foci of gas and marked soft tissue swelling are seen. No acute fracture or dislocation. No definite bone destruction identified to suggest osteomyelitis. IMPRESSION: Soft tissue swelling and soft tissue gas at the third toe consistent with soft tissue infection and  questioned corresponding to reported ulcer. No definite radiographic evidence of osteomyelitis identified. Prior amputations through the first and second rays through the distal metatarsals. Electronically Signed   By: Lavonia Dana M.D.   On: 01/15/2023 14:51    Pending Labs Unresulted Labs (From admission, onward)     Start     Ordered   01/15/23 1550  Urinalysis, Routine w reflex microscopic -Urine, Clean Catch  Once,   URGENT       Question:  Specimen Source  Answer:  Urine, Clean Catch   01/15/23 1549            Vitals/Pain Today's Vitals   01/15/23 1920 01/15/23 1939 01/15/23 2041 01/15/23 2043  BP: (!) 159/85  (!) 142/79   Pulse: (!) 105  (!) 104   Resp:   18   Temp:  100.2 F (37.9 C)  (!) 101.8 F (38.8 C)  TempSrc:  Oral  Oral  SpO2: 99%  96%   PainSc:   8      Isolation Precautions No active isolations  Medications Medications  vancomycin (VANCOCIN) IVPB 1000 mg/200 mL premix (1,000 mg Intravenous New Bag/Given 01/15/23 2040)    Mobility walks     Focused Assessments Cardiac Assessment Handoff:    Lab Results  Component Value Date   CKTOTAL 109 01/10/2008   CKMB 3.0 01/10/2008   TROPONINI 0.01        NO INDICATION OF MYOCARDIAL INJURY. 01/10/2008   No results found for: "DDIMER" Does the Patient currently have chest pain? No   , Pulmonary Assessment Handoff:  Lung sounds:   O2 Device: Room Air      R Recommendations: See Admitting Provider Note  Report given to:   Additional Notes:

## 2023-01-15 NOTE — ED Triage Notes (Signed)
Pt came in via POV d/t infection around a blister that developed under that Rt foot, that Rt foot had 2 toes amputated back in October. Pt reports fevers up to 102.9, reports pain 10/10.

## 2023-01-15 NOTE — ED Notes (Addendum)
Patient is being discharged from the Urgent Care and sent to the Emergency Department via POV . Per PA, patient is in need of higher level of care due to diabetic foot ulcer/osteomylitis, abnormal vital signs. Patient is aware and verbalizes understanding of plan of care.  Vitals:   01/15/23 1418  BP: 137/71  Pulse: (!) 107  Temp: (!) 102.9 F (39.4 C)  SpO2: 95%

## 2023-01-15 NOTE — ED Triage Notes (Signed)
Pt reports had two toes amputated on right foot on 07/24/22. Pt reports area beside surgical site is now red/blistered. Pt reports is in physical therapy and reports does not have shoe inserts and reports fever/chills,cough x2 days.

## 2023-01-15 NOTE — ED Notes (Signed)
Ointment applied, non-adherent pad placed to site and secured with conform dressing. Pt tolerated well.   Site management and infection prevention education attempted. Pt and pt family reported "we do this everyday."

## 2023-01-16 DIAGNOSIS — E11628 Type 2 diabetes mellitus with other skin complications: Secondary | ICD-10-CM

## 2023-01-16 DIAGNOSIS — L03115 Cellulitis of right lower limb: Secondary | ICD-10-CM

## 2023-01-16 DIAGNOSIS — N179 Acute kidney failure, unspecified: Secondary | ICD-10-CM

## 2023-01-16 DIAGNOSIS — S91309A Unspecified open wound, unspecified foot, initial encounter: Secondary | ICD-10-CM

## 2023-01-16 DIAGNOSIS — S91301A Unspecified open wound, right foot, initial encounter: Secondary | ICD-10-CM

## 2023-01-16 DIAGNOSIS — A419 Sepsis, unspecified organism: Secondary | ICD-10-CM

## 2023-01-16 LAB — CBC
HCT: 33.1 % — ABNORMAL LOW (ref 39.0–52.0)
Hemoglobin: 10.9 g/dL — ABNORMAL LOW (ref 13.0–17.0)
MCH: 28.7 pg (ref 26.0–34.0)
MCHC: 32.9 g/dL (ref 30.0–36.0)
MCV: 87.1 fL (ref 80.0–100.0)
Platelets: 394 10*3/uL (ref 150–400)
RBC: 3.8 MIL/uL — ABNORMAL LOW (ref 4.22–5.81)
RDW: 12.8 % (ref 11.5–15.5)
WBC: 17.5 10*3/uL — ABNORMAL HIGH (ref 4.0–10.5)
nRBC: 0 % (ref 0.0–0.2)

## 2023-01-16 LAB — BASIC METABOLIC PANEL
Anion gap: 16 — ABNORMAL HIGH (ref 5–15)
BUN: 28 mg/dL — ABNORMAL HIGH (ref 6–20)
CO2: 27 mmol/L (ref 22–32)
Calcium: 8.7 mg/dL — ABNORMAL LOW (ref 8.9–10.3)
Chloride: 90 mmol/L — ABNORMAL LOW (ref 98–111)
Creatinine, Ser: 1.49 mg/dL — ABNORMAL HIGH (ref 0.61–1.24)
GFR, Estimated: 56 mL/min — ABNORMAL LOW (ref >60)
Glucose, Bld: 154 mg/dL — ABNORMAL HIGH (ref 70–99)
Potassium: 3.3 mmol/L — ABNORMAL LOW (ref 3.5–5.1)
Sodium: 133 mmol/L — ABNORMAL LOW (ref 135–145)

## 2023-01-16 LAB — GLUCOSE, CAPILLARY
Glucose-Capillary: 163 mg/dL — ABNORMAL HIGH (ref 70–99)
Glucose-Capillary: 152 mg/dL — ABNORMAL HIGH (ref 70–99)
Glucose-Capillary: 136 mg/dL — ABNORMAL HIGH (ref 70–99)
Glucose-Capillary: 187 mg/dL — ABNORMAL HIGH (ref 70–99)

## 2023-01-16 MED ORDER — POTASSIUM CHLORIDE 20 MEQ PO PACK
40.0000 meq | PACK | Freq: Once | ORAL | Status: AC
  Administered 2023-01-16: 40 meq via ORAL
  Filled 2023-01-16: qty 2

## 2023-01-16 MED ORDER — SODIUM CHLORIDE 0.9 % IV SOLN
2.0000 g | Freq: Three times a day (TID) | INTRAVENOUS | Status: DC
  Administered 2023-01-16 – 2023-01-23 (×22): 2 g via INTRAVENOUS
  Filled 2023-01-16 (×22): qty 12.5

## 2023-01-16 MED ORDER — VANCOMYCIN VARIABLE DOSE PER UNSTABLE RENAL FUNCTION (PHARMACIST DOSING): Status: DC

## 2023-01-16 NOTE — H&P (Incomplete)
Hospital Admission History and Physical Service Pager: [phone]  Patient name: Sean Carter Medical record number: [id number] Date of Birth: 01-Oct-1971 Age: 52 y.o. Gender: male  Primary Care Provider: Gerrit Heck, MD Consultants: VVS Code Status: Full Code  Preferred Emergency Contact:  Contact Information     Name Relation Home Work Mobile   Thompson, [place] other [phone]          Chief Complaint: R foot wound  Assessment and Plan: Sean Carter is a 52 y.o. male presenting with worsening R diabetic foot ulcer. Differential for this patient's presentation includes cellulitis (XR confirmed soft tissue swelling and gas formation), osteomyelitis (no evidence of osteo on XR) and septic joint (ulceration with erythema and warmth extending up the R LE)  * Sepsis ([name]) Febrile, tachycardic and leukocytosis upon admission meeting SIRS criteria with likely source of diabetic R foot wound with purulent drainage. XR confirm soft tissue infection and gas of R foot. Started on Vancomycin in the ED. VVS consulted and recommended admission for IV antibiotics and observation over the weekend with likely intervention on 4/1 if not improving. Recommended against further imaging at this time. Upon exam, R foot with significant erythema, edema and warm with half-dollar size ulcer leaking purulent fluid. Erythema and warmth extend up L calf concerning for extended infection. -Admitted to [facility]MT[facility] service med tele, Dr. Erin Hearing attending -VVS consulted, appreciate recs -Add Cefepime for broader coverage and continue Vancomycin  -BC (obtained after initiation of Vancomycin by ED) -Pain regimen: Tylenol 650mg  q6h sch, Oxycodone 5mg  q6h prn -Trend fever curve -Cardiac monitoring -Consult PT/OT as needed when infection improved -Vital per routine  Hypertension associated with diabetes ([place]) Mildly hypertensive upon admission but had not taken medication today. Compliance on  Amlodipine-Valsartan-HCTZ combo pill. -Restart formulary alternative to home med  Restrictive lung disease Mild restrictive lung disease with response to albuterol on PFTs in 08/2022. Currently endorses persistent, non-productive cough. No wheezing noted. CXR negative for acute process. -Albuterol neb once then q6h prn -Continue home Montelukast  Diabetes mellitus type 2 with peripheral artery disease ([name]) A1c 6.8 in 06/2022. Compliant on Basaglar 28U daily and Humalog 8U TID. -mSSI + qhs coverage -CBG checks -F/u A1c   Chronic conditions: HLD: continue Lipitor GERD: continue Pepcid  FEN/GI: Carb modified diet VTE Prophylaxis: Lovenox  Disposition: Med tele  History of Present Illness:  Sean Carter is a 52 y.o. male presenting with R foot ulcer with purulent drainage. Wife present via phone and provided additional information.   Wife has been wrapping patient's foot everyday due to chronic blister. Noted Wednesday there was blister with increasing redness on the R foot. Progressed to a tiny hole on Thursday. Today (Friday) patient woke up feeling ill with fever and nausea. Reports the redness and swelling was much worse and now had purulent drainage from the ulcer site. Reports pain started started Monday and has progressively worsened over the course of the week. 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 States he worked 12+ hour all week until today. Denies any CP, SOB, and was otherwise in normal health.   Sees [facility], Dr. Edison Pace for outpatient management. S/p R 1st and 2nd ray amputation on 07/22/2022. Was told to keep foot blister wrapped. States he has had swelling in his R leg since that surgery   In the ED, XR showed soft tissue infection and  gas without evidence of osteomyelitis. VVS curbsided and recommended admission of IV antibiotics.  Review Of Systems: Per HPI above  Pertinent Past Medical  History: T2DM Sepsis L foot gangrene PAD HTN Remainder reviewed in history tab.   Pertinent Past Surgical History: L toe amputation R 1st and 2nd toe amputation Remainder reviewed in history tab.   Pertinent Social History: Tobacco use: None Alcohol use: None Other Substance use: None Lives with Wife Dub Mikes)  Pertinent Family History: None pertinent Remainder reviewed in history tab.   Important Outpatient Medications: Albuterol prn Amlodipine-Valsartan-HCTZ qhs Lipitor 40mg  daily Zyretec 10mg  - not taking Pepcid 40mg  BID  Humalog 8U TID - has not had today Basaglar 28U daily - took today Singulair 10mg  - takes at night Claratin 10mg  - not taking Tramadol 50mg  q6h - not taking Takes multivitamin Takes benadryl at night for allergies Remainder reviewed in medication history.   Objective: BP 138/82 (BP Location: Left Arm)   Pulse 96   Temp (!) 102.1 F (38.9 C) (Oral)   Resp 20   Ht 5\' 8"  (1.727 m)   Wt 91.2 kg   SpO2 96%   BMI 30.56 kg/m  Exam: General: Well-appearing, alert, NAD Eyes: Anicteric sclera ENTM: Moist mucus membranes. Neck: Supple, non-tender Cardiovascular: RRR without murmur Respiratory: CTAB. Normal WOB on RA Gastrointestinal: Soft, obese abdomen, non-tender MSK: R foot with amputation site of 1st and 2nd toe. Large ulceration on plantar surface with purulent drainage and significant surrounding erythema, edema and warm. Additional site of purulent drainage on plantar surface of 3rd digit. Erythema and warmth extending up L LE to mid calf level. Derm: Warm, dry Neuro: Moves all 4 extremities spontaneously Psych: Cooperative, pleasant  .  Labs:  CBC BMET  Recent Labs  Lab 01/15/23 1553  WBC 13.1*  HGB 11.1*  HCT 34.2*  PLT 417*   Recent Labs  Lab 01/15/23 1553  NA 133*  K 3.5  CL 93*  CO2 27  BUN 28*  CREATININE 1.50*  GLUCOSE 175*  CALCIUM 8.6*    Pertinent additional labs: Lactic acid: 1.3>1.6  EKG: Sinus  tachycardia   Imaging Studies Performed: DG Chest 2 View Result Date: 01/15/2023 IMPRESSION: Clear lungs. Compression fracture at thoracolumbar junction question subacute to old though new since 2021.  DG Foot Complete Right Result Date: 01/15/2023 IMPRESSION: Soft tissue swelling and soft tissue gas at the third toe consistent with soft tissue infection and questioned corresponding to reported ulcer. No definite radiographic evidence of osteomyelitis identified. Prior amputations through the first and second rays through the distal metatarsals.    Colletta Maryland, MD 01/15/2023, 11:30 PM PGY-1, [place] Intern pager: [phone], text pages welcome Secure chat group [place]

## 2023-01-16 NOTE — Progress Notes (Signed)
Daily Progress Note Intern Pager: [phone]  Patient name: Sean Carter Medical record number: [id number] Date of birth: 12-May-1971 Age: 52 y.o. Gender: male  Primary Care Provider: Gerrit Heck, MD Consultants: VVS Code Status: Full Code  Pt Overview and Major Events to Date:  01/15/23 - admitted  Assessment and Plan:  Sean Carter is a 52 y.o. male presenting meeting sepsis criteria with worsening R diabetic foot ulcer. PMH significant for DM2, HTN, and PAD.  * Sepsis ([place]) Febrile, tachycardic w/ leukocytosis w/ purulent foot wound meeting sepsis criteria on admission. XR confirm soft tissue infection and gas of R foot. Started on Vancomycin and Cefepime. Blood cultures collected after initiation of abx. Febrile overnight, w/ otherwise stable VS. VVS following considering TMA vs BKA.  -Continue Vancomycin (3/29-) -Continue Cefepime (3/29-) -Vitals per unit -Cardiac monitoring -F/u Bcx -Trend fever curve -AM CBC & BMP   Diabetic infection of right foot ([name]) Patient w/ diabetic foot wound that evolved quickly over 2-3 days. XR confirmed infection w/o signs of osteomyelitis. VVS consulted did not want further imaging, and recommend arteriogram to determine need for BKA vs TMA. Plan for arteriogram 4/1, w/ later surgery to follow. Will continue abx until source control achieved.  -VVS following, appreciate recs -Continue abx -Arteriogram Monday 4/1 -NPO Sunday 3/31 at midnight -Pain regimen: Tylenol 650mg  q6h sch, Oxycodone 5mg  q6h prn -Consult PT/OT as needed when infection improved  AKI (acute kidney injury) ([name]) Patient noted to have Cr of 1.50 on admission. Baseline Cr ~ 1.1 in 2023. This could represent AKI or chronic worsening renal function. Likely pre-renal given hx of poor PO and malaise. Cr stable overnight, labs collected early overnight, prior to patient having improved PO/diet. Will continue to monitor. -Encourage PO fluids -AM BMP -Hold ARB for  AKI -Avoid nephrotoxic medications  Hypertension associated with diabetes ([name]) Mildly hypertensive upon admission but had not taken medication. BP WNL overnight.  -Continue Amlodipine 5 mg / HCTZ 12.5 mg -Hold Irbesartan 150 mg in setting of AKI  Diabetes mellitus type 2 with peripheral artery disease ([name]) A1c 6.8 in 06/2022. Home meds  Basaglar 28U daily and Humalog 8U TID. CBG WNL overnight.  -mSSI + qhs coverage -CBG checks -F/u A1c  Restrictive lung disease Mild restrictive lung disease with response to albuterol on PFTs in 08/2022. Currently endorses persistent, non-productive cough. No wheezing noted. CXR negative for acute process. -Albuterol neb once then q6h prn -Continue home Montelukast   FEN/GI: Carb Modified PPx: Lovenox Dispo: Pending clinical w/u .   Subjective:  Doing well this morning, pain well controlled.  Objective: Temp:  [98.9 F (37.2 C)-102.9 F (39.4 C)] 99 F (37.2 C) (03/30 0317) Pulse Rate:  [73-107] 73 (03/30 0317) Resp:  [15-20] 18 (03/30 0317) BP: (109-159)/(60-85) 109/60 (03/30 0317) SpO2:  [94 %-99 %] 94 % (03/30 0317) Weight:  [91.2 kg] 91.2 kg (03/29 2200) Physical Exam: General: NAD, laying in bed comfortably, conversant Cardiovascular: RRR, NRMG Respiratory: CTABL, no wheezes Abdomen: Soft, NTTP, non-distended Extremities: Moving extremities, right food bandaged in clean/dry/intact dressing  Laboratory: Most recent CBC Lab Results  Component Value Date   WBC 17.5 (H) 01/16/2023   HGB 10.9 (L) 01/16/2023   HCT 33.1 (L) 01/16/2023   MCV 87.1 01/16/2023   PLT 394 01/16/2023   Most recent BMP    Latest Ref Rng & Units 01/16/2023   12:01 AM  BMP  Glucose 70 - 99 mg/dL 154   BUN 6 - 20  mg/dL 28   Creatinine 0.61 - 1.24 mg/dL 1.49   Sodium 135 - 145 mmol/L 133   Potassium 3.5 - 5.1 mmol/L 3.3   Chloride 98 - 111 mmol/L 90   CO2 22 - 32 mmol/L 27   Calcium 8.9 - 10.3 mg/dL 8.7     [name], MD 01/16/2023, 7:06  AM  PGY-2, [place] Intern pager: [phone], text pages welcome Secure chat group [place]

## 2023-01-16 NOTE — Hospital Course (Addendum)
Sean Carter is a 52 y.o.male with a history of T2DM, PAD, HTN and h/o multiple toe amputations who was admitted to the [facility] Teaching Service at [facility] for diabetic R foot ulcer. His hospital course is detailed below:  Diabetic R foot wound Admitted with large ulceration on plantar surface of R foot with purulent drainage and significant erythema, edema, and warmth extending to the R LE. VVS consulted and recommended IV abx and arteriogram to determine amputation site. BC obtained after Vancomycin was started and then continued on Cefepime/Vanc. Arteriogram with occluded proximal posterior tibial artery. Transmetatarsal amputation performed on 4/3 and wound vac placed. He was taken for wound washout on 4/5 to remove residual infection. Wound cx collected which demonstrated rare GBS (positive B lactamase), rare prevotella bivia, few fusobacterium nucleatum, rare eikenella corrodens. He was transitioned to IV cefepime alone. By discharge, Bcx remained NGTD, his wound was healing well, and he had outpatient DM follow up scheduled.  AKI Presented with Cr 1.50 from baseline around 1.0-1.1. Likely prerenal. Cr improved with IVF and increasing PO. By discharge, Cr was ***.  Other chronic conditions were medically managed with home medications and formulary alternatives as necessary: HTN (held ARB given AKI), T2DM (given rSSI, 3u TID with meals, and ultimately home dose of LAI 28u), restrictive lung disease with allergies (continued home montelukast and added claritin, tessalon perles).  PCP Follow-up Recommendations:  Ensure continued surgical healing and abx completion *** Assess glucose control on insulin regimen Evaluate renal function

## 2023-01-16 NOTE — Progress Notes (Addendum)
  Progress Note    01/16/2023 10:05 AM * No surgery found *  Subjective: No complaints   Vitals:   01/16/23 0317 01/16/23 0735  BP: 109/60 113/61  Pulse: 73 78  Resp: 18 17  Temp: 99 F (37.2 C) 98.5 F (36.9 C)  SpO2: 94% 96%   Physical Exam: Lungs: Nonlabored Extremities: Dressing left in place right foot; unable to palpate pedal pulses however patient has a 2+ right popliteal pulse Neurologic: A&O  CBC    Component Value Date/Time   WBC 17.5 (H) 01/16/2023 0001   RBC 3.80 (L) 01/16/2023 0001   HGB 10.9 (L) 01/16/2023 0001   HCT 33.1 (L) 01/16/2023 0001   PLT 394 01/16/2023 0001   MCV 87.1 01/16/2023 0001   MCH 28.7 01/16/2023 0001   MCHC 32.9 01/16/2023 0001   RDW 12.8 01/16/2023 0001   LYMPHSABS 1.1 01/15/2023 1553   MONOABS 1.1 (H) 01/15/2023 1553   EOSABS 0.0 01/15/2023 1553   BASOSABS 0.0 01/15/2023 1553    BMET    Component Value Date/Time   NA 133 (L) 01/16/2023 0001   NA 142 11/17/2021 1732   K 3.3 (L) 01/16/2023 0001   CL 90 (L) 01/16/2023 0001   CO2 27 01/16/2023 0001   GLUCOSE 154 (H) 01/16/2023 0001   BUN 28 (H) 01/16/2023 0001   BUN 37 (H) 11/17/2021 1732   CREATININE 1.49 (H) 01/16/2023 0001   CREATININE 0.78 07/10/2020 0910   CALCIUM 8.7 (L) 01/16/2023 0001   GFRNONAA 56 (L) 01/16/2023 0001   GFRNONAA 106 07/10/2020 0910   GFRAA 123 07/10/2020 0910    INR    Component Value Date/Time   INR 1.1 05/30/2020 1049     Intake/Output Summary (Last 24 hours) at 01/16/2023 1005 Last data filed at 01/16/2023 0134 Gross per 24 hour  Intake 681.86 ml  Output --  Net 681.86 ml     Assessment/Plan:  52 y.o. male with infected right foot  Continue current wound care with at least daily dressing changes.  Agree with broad-spectrum IV antibiotic regimen.  Plan is for angiogram of the right lower extremity on Monday.  Patient will at least need a transmetatarsal amputation and possibly a leg amputation pending angiogram results.  Okay to  heel weight-bear   Dagoberto Ligas, PA-C [facility] [phone] 01/16/2023 10:05 AM  I agree with the above.  I have seen and evaluated the patient.  He should remain on IV antibiotics over the weekend with plans for angiography on Monday to further evaluate his blood flow and determine the level of amputation.  He will need to be n.p.o. after midnight on Sunday  Wells Calistro Rauf

## 2023-01-16 NOTE — Assessment & Plan Note (Addendum)
Waxing and waning. Cr this AM 1.08 (baseline around 1.1). -Continue to monitor, encourage good PO

## 2023-01-16 NOTE — Progress Notes (Signed)
Pharmacy Antibiotic Note  Sean Carter is a 52 y.o. male admitted on 01/15/2023 with diabetic foot wound infection.  Pharmacy has been consulted for vancomycin and cefepime dosing.  Pt with AKI; baseline SCr ~1, now 1.5.  Plan: Vancomycin 1750mg  IV x1; monitor SCr +/- vanc levels prior to redosing. Cefepime 2g IV Q8H.  Height: 5\' 8"  (172.7 cm) Weight: 91.2 kg (201 lb) IBW/kg (Calculated) : 68.4  Temp (24hrs), Avg:102 F (38.9 C), Min:100.2 F (37.9 C), Max:102.9 F (39.4 C)  Recent Labs  Lab 01/15/23 1553 01/15/23 1936 01/16/23 0001  WBC 13.1*  --  17.5*  CREATININE 1.50*  --  1.49*  LATICACIDVEN 1.3 1.6  --     Estimated Creatinine Clearance: 63.6 mL/min (A) (by C-G formula based on SCr of 1.49 mg/dL (H)).    Allergies  Allergen Reactions   Scallops [Shellfish Allergy] Anaphylaxis   Penicillins Other (See Comments)    Unknown reaction as a child    Thank you for allowing pharmacy to be a part of this patient's care.  Wynona Neat, PharmD, BCPS  01/16/2023 1:41 AM

## 2023-01-16 NOTE — Progress Notes (Signed)
Daily Progress Note Intern Pager: [phone]  Patient name: Sean Carter Medical record number: [id number] Date of birth: October 25, 1970 Age: 52 y.o. Gender: male  Primary Care Provider: Gerrit Heck, MD Consultants: Vascular surgery Code Status: FULL  Pt Overview and Major Events to Date:  3/29-admitted  Assessment and Plan: Sean Carter is a 52 y.o. male PMH significant for DM2, HTN, and PAD presenting meeting sepsis criteria with worsening R diabetic foot ulcer.  * Sepsis ([place]) Febrile, tachycardic w/ leukocytosis w/ purulent diabetic foot wound meeting sepsis criteria on admission. XR confirm soft tissue infection and gas of R foot. Blood cultures collected after initiation of abx show no growth <12 hours. WBC today is 14.7 from 17.5, afebrile overnight. VVS following considering TMA vs BKA pending angiogram 4/1.  -Continue Vancomycin (3/29-) -Continue Cefepime (3/29-) -Vascular surgery following, appreciate recs -Vitals per unit -Cardiac monitoring -F/u Bcx -Trend fever curve -AM CBC, BMP   Diabetic infection of right foot ([place]) Patient w/ diabetic foot wound that evolved quickly over 2-3 days. XR confirmed infection w/o signs of osteomyelitis. VVS recommend arteriogram to determine need for BKA vs TMA. Plan for angiogram 4/1, w/ later surgery to follow. Will continue abx until source control achieved.  -VVS following, appreciate recs -Continue abx -Angiogram Monday 4/1 -NPO Sunday 3/31 at midnight -Pain regimen: Tylenol 650mg  q6h sch, Oxycodone 5mg  q6h prn -Consult PT/OT as needed when infection improved  AKI (acute kidney injury) ([place]) Patient noted to have Cr of 1.50 on admission, today it is 1.39. Baseline Cr ~ 1.1 in 2023.  Likely prerenal.  AKI vs chronic worsening renal function.  -AM BMP -Hold ARB for AKI -Avoid nephrotoxic medications  Hypertension associated with diabetes ([place]) BP normotensive. -Continue home Amlodipine 5 mg / HCTZ 12.5 mg -Hold  Irbesartan 150 mg in setting of AKI  Diabetes mellitus type 2 with peripheral artery disease ([name]) A1c 6.8 in 06/2022. Home meds  Basaglar 28U daily and Humalog 8U TID. CBG range has been 136-219. A1c is in process. -mSSI + qhs coverage -CBG checks  Restrictive lung disease Mild restrictive lung disease with response to albuterol on PFTs in 08/2022. Currently endorses persistent, non-productive cough. No wheezing noted. CXR negative for acute process. -Albuterol neb once then q6h prn -Continue home Montelukast   FEN/GI: Carb modified, NPO at MN PPx: lovenox Dispo:pending angiography Monday 4/1 for level of amputation needed  Subjective:  Patient says that he felt like he was feverish overnight-lots of sweating/clamminess.  However did not have an actual fever.  Denies any vomiting or diarrhea.  He says the pain is okay but not fully controlled with the pain medications.  Objective: Temp:  [98.1 F (36.7 C)-99.3 F (37.4 C)] 98.1 F (36.7 C) (03/31 0330) Pulse Rate:  [75-96] 75 (03/31 0330) Resp:  [17-21] 18 (03/31 0330) BP: (113-139)/(60-72) 117/72 (03/31 0330) SpO2:  [93 %-100 %] 100 % (03/31 0330) Physical Exam: General: NAD, awake, alert, oriented and responsive to all questions Cardiovascular: RRR, no murmurs rubs or gallops Respiratory: Clear to auscultation bilaterally, no wheezes rales or crackles, no increased work of breathing on room air Abdomen: Soft, nontender, nondistended, normoactive bowel sounds Extremities: No lower extremity edema, right foot with bandage has some yellow strikethrough on the plantar surface of right foot, moving both extremities well  Laboratory: Most recent CBC Lab Results  Component Value Date   WBC 14.7 (H) 01/17/2023   HGB 9.5 (L) 01/17/2023   HCT 29.1 (L) 01/17/2023   MCV 86.9  01/17/2023   PLT 345 01/17/2023   Most recent BMP    Latest Ref Rng & Units 01/17/2023    1:29 AM  BMP  Glucose 70 - 99 mg/dL 219   BUN 6 - 20 mg/dL 39    Creatinine 0.61 - 1.24 mg/dL 1.39   Sodium 135 - 145 mmol/L 129   Potassium 3.5 - 5.1 mmol/L 3.9   Chloride 98 - 111 mmol/L 94   CO2 22 - 32 mmol/L 25   Calcium 8.9 - 10.3 mg/dL 8.3     Gerrit Heck, MD 01/17/2023, 6:06 AM  PGY-2, [place] Intern pager: [phone], text pages welcome Secure chat group [place]

## 2023-01-16 NOTE — Assessment & Plan Note (Addendum)
POD 6 TMA. Wound gram stain mod GNR and few GPC in pairs. Wound culture with rare GBS (positive B lactamase), rare prevotella bivia, few fusobacterium nucleatum, rare eikenella corrodens. Bcx no growth. WBC improving, afebrile. -VVS following, appreciate recs, planned for VAC change on Monday -De-escalate to cefepime only (3/29-4/6) and stop vanc given G- isolation (3/29-4-6). Then transitioned to Unasyn (4/6-). Plan for augmentin course for 4 weeks at discharge. -Pain regimen: Tylenol 650mg  q6h sch, Oxycodone 5mg  q6h prn -Consult PT/OT likely outpatient after wound closure

## 2023-01-17 DIAGNOSIS — N179 Acute kidney failure, unspecified: Secondary | ICD-10-CM

## 2023-01-17 DIAGNOSIS — I152 Hypertension secondary to endocrine disorders: Secondary | ICD-10-CM

## 2023-01-17 DIAGNOSIS — E1159 Type 2 diabetes mellitus with other circulatory complications: Secondary | ICD-10-CM

## 2023-01-17 DIAGNOSIS — L089 Local infection of the skin and subcutaneous tissue, unspecified: Secondary | ICD-10-CM

## 2023-01-17 DIAGNOSIS — E11628 Type 2 diabetes mellitus with other skin complications: Secondary | ICD-10-CM

## 2023-01-17 LAB — BASIC METABOLIC PANEL
Anion gap: 10 (ref 5–15)
BUN: 39 mg/dL — ABNORMAL HIGH (ref 6–20)
CO2: 25 mmol/L (ref 22–32)
Calcium: 8.3 mg/dL — ABNORMAL LOW (ref 8.9–10.3)
Chloride: 94 mmol/L — ABNORMAL LOW (ref 98–111)
Creatinine, Ser: 1.39 mg/dL — ABNORMAL HIGH (ref 0.61–1.24)
GFR, Estimated: >60 mL/min (ref >60)
Glucose, Bld: 219 mg/dL — ABNORMAL HIGH (ref 70–99)
Potassium: 3.9 mmol/L (ref 3.5–5.1)
Sodium: 129 mmol/L — ABNORMAL LOW (ref 135–145)

## 2023-01-17 LAB — CBC
HCT: 29.1 % — ABNORMAL LOW (ref 39.0–52.0)
Hemoglobin: 9.5 g/dL — ABNORMAL LOW (ref 13.0–17.0)
MCH: 28.4 pg (ref 26.0–34.0)
MCHC: 32.6 g/dL (ref 30.0–36.0)
MCV: 86.9 fL (ref 80.0–100.0)
Platelets: 345 10*3/uL (ref 150–400)
RBC: 3.35 MIL/uL — ABNORMAL LOW (ref 4.22–5.81)
RDW: 12.8 % (ref 11.5–15.5)
WBC: 14.7 10*3/uL — ABNORMAL HIGH (ref 4.0–10.5)
nRBC: 0 % (ref 0.0–0.2)

## 2023-01-17 LAB — VANCOMYCIN, RANDOM: Vancomycin Rm: 6 ug/mL

## 2023-01-17 LAB — GLUCOSE, CAPILLARY
Glucose-Capillary: 169 mg/dL — ABNORMAL HIGH (ref 70–99)
Glucose-Capillary: 231 mg/dL — ABNORMAL HIGH (ref 70–99)
Glucose-Capillary: 290 mg/dL — ABNORMAL HIGH (ref 70–99)
Glucose-Capillary: 224 mg/dL — ABNORMAL HIGH (ref 70–99)

## 2023-01-17 MED ORDER — BENZONATATE 100 MG PO CAPS
100.0000 mg | ORAL_CAPSULE | Freq: Two times a day (BID) | ORAL | Status: DC | PRN
  Administered 2023-01-17 – 2023-01-23 (×4): 100 mg via ORAL
  Filled 2023-01-17 (×4): qty 1

## 2023-01-17 MED ORDER — VANCOMYCIN HCL 1250 MG/250ML IV SOLN
1250.0000 mg | INTRAVENOUS | Status: DC
  Administered 2023-01-17 – 2023-01-19 (×3): 1250 mg via INTRAVENOUS
  Filled 2023-01-17 (×3): qty 250

## 2023-01-17 MED ORDER — TRAMADOL HCL 50 MG PO TABS
50.0000 mg | ORAL_TABLET | Freq: Two times a day (BID) | ORAL | Status: DC | PRN
  Administered 2023-01-19: 50 mg via ORAL
  Filled 2023-01-17: qty 1

## 2023-01-17 MED ORDER — DIPHENHYDRAMINE HCL 25 MG PO CAPS
25.0000 mg | ORAL_CAPSULE | Freq: Every day | ORAL | Status: DC | PRN
  Administered 2023-01-17: 25 mg via ORAL
  Filled 2023-01-17: qty 1

## 2023-01-17 NOTE — Progress Notes (Signed)
Pharmacy Antibiotic Note  Sean Carter is a 52 y.o. male admitted on 01/15/2023 with diabetic foot wound infection awaiting angiography for amputation.  Pharmacy has been consulted for vancomycin and cefepime dosing.  SCr improving.  Random vanc level 6 ~24h after loading dose.  Plan: Vancomycin 1250 mg IV Q24H. Goal AUC 400-550.  Expected AUC 475. Continue cefepime 2g IV Q8H.  Height: 5\' 8"  (172.7 cm) Weight: 91.2 kg (201 lb) IBW/kg (Calculated) : 68.4  Temp (24hrs), Avg:98.7 F (37.1 C), Min:98.3 F (36.8 C), Max:99.3 F (37.4 C)  Recent Labs  Lab 01/15/23 1553 01/15/23 1936 01/16/23 0001 01/17/23 0129  WBC 13.1*  --  17.5* 14.7*  CREATININE 1.50*  --  1.49* 1.39*  LATICACIDVEN 1.3 1.6  --   --   VANCORANDOM  --   --   --  6     Estimated Creatinine Clearance: 68.1 mL/min (A) (by C-G formula based on SCr of 1.39 mg/dL (H)).    Allergies  Allergen Reactions   Scallops [Shellfish Allergy] Anaphylaxis   Penicillins Other (See Comments)    Unknown reaction as a child    Thank you for allowing pharmacy to be a part of this patient's care.  Wynona Neat, PharmD, BCPS  01/17/2023 2:26 AM

## 2023-01-17 NOTE — H&P (View-Only) (Signed)
  Progress Note    01/17/2023 8:51 AM * No surgery found *  Subjective:  no complaints   Vitals:   01/17/23 0330 01/17/23 0828  BP: 117/72 129/68  Pulse: 75 70  Resp: 18 19  Temp: 98.1 F (36.7 C) 98 F (36.7 C)  SpO2: 100% 100%   Physical Exam: Lungs:  non labored Extremities:  palpable R popliteal pulse; palpable L femoral pulse Neurologic: A&O  CBC    Component Value Date/Time   WBC 14.7 (H) 01/17/2023 0129   RBC 3.35 (L) 01/17/2023 0129   HGB 9.5 (L) 01/17/2023 0129   HCT 29.1 (L) 01/17/2023 0129   PLT 345 01/17/2023 0129   MCV 86.9 01/17/2023 0129   MCH 28.4 01/17/2023 0129   MCHC 32.6 01/17/2023 0129   RDW 12.8 01/17/2023 0129   LYMPHSABS 1.1 01/15/2023 1553   MONOABS 1.1 (H) 01/15/2023 1553   EOSABS 0.0 01/15/2023 1553   BASOSABS 0.0 01/15/2023 1553    BMET    Component Value Date/Time   NA 129 (L) 01/17/2023 0129   NA 142 11/17/2021 1732   K 3.9 01/17/2023 0129   CL 94 (L) 01/17/2023 0129   CO2 25 01/17/2023 0129   GLUCOSE 219 (H) 01/17/2023 0129   BUN 39 (H) 01/17/2023 0129   BUN 37 (H) 11/17/2021 1732   CREATININE 1.39 (H) 01/17/2023 0129   CREATININE 0.78 07/10/2020 0910   CALCIUM 8.3 (L) 01/17/2023 0129   GFRNONAA >60 01/17/2023 0129   GFRNONAA 106 07/10/2020 0910   GFRAA 123 07/10/2020 0910    INR    Component Value Date/Time   INR 1.1 05/30/2020 1049     Intake/Output Summary (Last 24 hours) at 01/17/2023 0851 Last data filed at 01/17/2023 0600 Gross per 24 hour  Intake 610 ml  Output 351 ml  Net 259 ml     Assessment/Plan:  52 y.o. male with right foot wound  Continue current daily wound care.  Continue broad spectrum IV antibiotics.  Plan is for aortogram with right leg runoff and possible intervention tomorrow.  All questions were answered.  NPO past midnight.  Consent ordered.   Dagoberto Ligas, PA-C [facility] [phone] 01/17/2023 8:51 AM  I agree with the above.  Have seen and evaluated the  patient.  He has subjective fevers and chills.  I discussed proceeding with angiogram tomorrow with focus on the right leg to define his blood flow and help determine the level of amputation.  He will be n.p.o. after midnight.  Anticipate surgical intervention of the right foot on Tuesday with Dr. Janalyn Harder

## 2023-01-17 NOTE — Progress Notes (Addendum)
  Progress Note    01/17/2023 8:51 AM * No surgery found *  Subjective:  no complaints   Vitals:   01/17/23 0330 01/17/23 0828  BP: 117/72 129/68  Pulse: 75 70  Resp: 18 19  Temp: 98.1 F (36.7 C) 98 F (36.7 C)  SpO2: 100% 100%   Physical Exam: Lungs:  non labored Extremities:  palpable R popliteal pulse; palpable L femoral pulse Neurologic: A&O  CBC    Component Value Date/Time   WBC 14.7 (H) 01/17/2023 0129   RBC 3.35 (L) 01/17/2023 0129   HGB 9.5 (L) 01/17/2023 0129   HCT 29.1 (L) 01/17/2023 0129   PLT 345 01/17/2023 0129   MCV 86.9 01/17/2023 0129   MCH 28.4 01/17/2023 0129   MCHC 32.6 01/17/2023 0129   RDW 12.8 01/17/2023 0129   LYMPHSABS 1.1 01/15/2023 1553   MONOABS 1.1 (H) 01/15/2023 1553   EOSABS 0.0 01/15/2023 1553   BASOSABS 0.0 01/15/2023 1553    BMET    Component Value Date/Time   NA 129 (L) 01/17/2023 0129   NA 142 11/17/2021 1732   K 3.9 01/17/2023 0129   CL 94 (L) 01/17/2023 0129   CO2 25 01/17/2023 0129   GLUCOSE 219 (H) 01/17/2023 0129   BUN 39 (H) 01/17/2023 0129   BUN 37 (H) 11/17/2021 1732   CREATININE 1.39 (H) 01/17/2023 0129   CREATININE 0.78 07/10/2020 0910   CALCIUM 8.3 (L) 01/17/2023 0129   GFRNONAA >60 01/17/2023 0129   GFRNONAA 106 07/10/2020 0910   GFRAA 123 07/10/2020 0910    INR    Component Value Date/Time   INR 1.1 05/30/2020 1049     Intake/Output Summary (Last 24 hours) at 01/17/2023 0851 Last data filed at 01/17/2023 0600 Gross per 24 hour  Intake 610 ml  Output 351 ml  Net 259 ml     Assessment/Plan:  52 y.o. male with right foot wound  Continue current daily wound care.  Continue broad spectrum IV antibiotics.  Plan is for aortogram with right leg runoff and possible intervention tomorrow.  All questions were answered.  NPO past midnight.  Consent ordered.   Dagoberto Ligas, PA-C [facility] [phone] 01/17/2023 8:51 AM  I agree with the above.  Have seen and evaluated the  patient.  He has subjective fevers and chills.  I discussed proceeding with angiogram tomorrow with focus on the right leg to define his blood flow and help determine the level of amputation.  He will be n.p.o. after midnight.  Anticipate surgical intervention of the right foot on Tuesday with Dr. Janalyn Harder

## 2023-01-18 ENCOUNTER — Ambulatory Visit: Payer: No Typology Code available for payment source

## 2023-01-18 ENCOUNTER — Encounter (HOSPITAL_COMMUNITY)
Admission: EM | Disposition: A | Discharge status: Home / Self Care | Payer: Self-pay | Source: Home / Self Care | Attending: Family Medicine

## 2023-01-18 ENCOUNTER — Ambulatory Visit: Payer: Self-pay

## 2023-01-18 ENCOUNTER — Inpatient Hospital Stay (HOSPITAL_COMMUNITY): Payer: Self-pay

## 2023-01-18 DIAGNOSIS — L0390 Cellulitis, unspecified: Secondary | ICD-10-CM

## 2023-01-18 DIAGNOSIS — E11621 Type 2 diabetes mellitus with foot ulcer: Secondary | ICD-10-CM

## 2023-01-18 DIAGNOSIS — I739 Peripheral vascular disease, unspecified: Secondary | ICD-10-CM

## 2023-01-18 HISTORY — PX: PERIPHERAL VASCULAR BALLOON ANGIOPLASTY: CATH118281

## 2023-01-18 HISTORY — PX: ABDOMINAL AORTOGRAM W/LOWER EXTREMITY: CATH118223

## 2023-01-18 LAB — BASIC METABOLIC PANEL
Anion gap: 12 (ref 5–15)
BUN: 33 mg/dL — ABNORMAL HIGH (ref 6–20)
CO2: 24 mmol/L (ref 22–32)
Calcium: 8.6 mg/dL — ABNORMAL LOW (ref 8.9–10.3)
Chloride: 95 mmol/L — ABNORMAL LOW (ref 98–111)
Creatinine, Ser: 1.21 mg/dL (ref 0.61–1.24)
GFR, Estimated: >60 mL/min (ref >60)
Glucose, Bld: 198 mg/dL — ABNORMAL HIGH (ref 70–99)
Potassium: 3.8 mmol/L (ref 3.5–5.1)
Sodium: 131 mmol/L — ABNORMAL LOW (ref 135–145)

## 2023-01-18 LAB — HEMOGLOBIN A1C
Hgb A1c MFr Bld: 7.2 % — ABNORMAL HIGH (ref 4.8–5.6)
Mean Plasma Glucose: 160 mg/dL

## 2023-01-18 LAB — CBC
HCT: 28.2 % — ABNORMAL LOW (ref 39.0–52.0)
Hemoglobin: 9.3 g/dL — ABNORMAL LOW (ref 13.0–17.0)
MCH: 28.4 pg (ref 26.0–34.0)
MCHC: 33 g/dL (ref 30.0–36.0)
MCV: 86 fL (ref 80.0–100.0)
Platelets: 326 10*3/uL (ref 150–400)
RBC: 3.28 MIL/uL — ABNORMAL LOW (ref 4.22–5.81)
RDW: 12.7 % (ref 11.5–15.5)
WBC: 12.1 10*3/uL — ABNORMAL HIGH (ref 4.0–10.5)
nRBC: 0 % (ref 0.0–0.2)

## 2023-01-18 LAB — GLUCOSE, CAPILLARY
Glucose-Capillary: 153 mg/dL — ABNORMAL HIGH (ref 70–99)
Glucose-Capillary: 159 mg/dL — ABNORMAL HIGH (ref 70–99)
Glucose-Capillary: 136 mg/dL — ABNORMAL HIGH (ref 70–99)
Glucose-Capillary: 274 mg/dL — ABNORMAL HIGH (ref 70–99)

## 2023-01-18 LAB — POCT ACTIVATED CLOTTING TIME
Activated Clotting Time: 250 seconds
Activated Clotting Time: 196 seconds
Activated Clotting Time: 174 seconds

## 2023-01-18 LAB — VAS US ABI WITH/WO TBI
Left ABI: 1.5
Right ABI: 1.4

## 2023-01-18 SURGERY — ABDOMINAL AORTOGRAM W/LOWER EXTREMITY
Anesthesia: LOCAL

## 2023-01-18 MED ORDER — MIDAZOLAM HCL 2 MG/2ML IJ SOLN
INTRAMUSCULAR | Status: DC | PRN
  Administered 2023-01-18: 1 mg via INTRAVENOUS

## 2023-01-18 MED ORDER — FENTANYL CITRATE (PF) 100 MCG/2ML IJ SOLN
INTRAMUSCULAR | Status: DC | PRN
  Administered 2023-01-18: 25 ug via INTRAVENOUS

## 2023-01-18 MED ORDER — IODIXANOL 320 MG/ML IV SOLN
INTRAVENOUS | Status: DC | PRN
  Administered 2023-01-18: 85 mL

## 2023-01-18 MED ORDER — HEPARIN SODIUM (PORCINE) 1000 UNIT/ML IJ SOLN
INTRAMUSCULAR | Status: AC
  Filled 2023-01-18: qty 10

## 2023-01-18 MED ORDER — LIDOCAINE HCL (PF) 1 % IJ SOLN
INTRAMUSCULAR | Status: AC
  Filled 2023-01-18: qty 30

## 2023-01-18 MED ORDER — HEPARIN SODIUM (PORCINE) 1000 UNIT/ML IJ SOLN
INTRAMUSCULAR | Status: DC | PRN
  Administered 2023-01-18: 9000 [IU] via INTRAVENOUS

## 2023-01-18 MED ORDER — LORATADINE 10 MG PO TABS
10.0000 mg | ORAL_TABLET | Freq: Every day | ORAL | Status: DC
  Administered 2023-01-18 – 2023-01-26 (×8): 10 mg via ORAL
  Filled 2023-01-18 (×8): qty 1

## 2023-01-18 MED ORDER — MIDAZOLAM HCL 2 MG/2ML IJ SOLN
INTRAMUSCULAR | Status: AC
  Filled 2023-01-18: qty 2

## 2023-01-18 MED ORDER — ONDANSETRON HCL 4 MG/2ML IJ SOLN: 4.0000 mg | Freq: Four times a day (QID) | INTRAMUSCULAR | Status: DC | PRN

## 2023-01-18 MED ORDER — LABETALOL HCL 5 MG/ML IV SOLN: 10.0000 mg | INTRAVENOUS | Status: DC | PRN

## 2023-01-18 MED ORDER — SODIUM CHLORIDE 0.9% FLUSH: 3.0000 mL | INTRAVENOUS | Status: DC | PRN

## 2023-01-18 MED ORDER — HEPARIN SODIUM (PORCINE) 5000 UNIT/ML IJ SOLN
5000.0000 [IU] | Freq: Three times a day (TID) | INTRAMUSCULAR | Status: DC
  Administered 2023-01-18 – 2023-01-25 (×21): 5000 [IU] via SUBCUTANEOUS
  Filled 2023-01-18 (×21): qty 1

## 2023-01-18 MED ORDER — HEPARIN (PORCINE) IN NACL 1000-0.9 UT/500ML-% IV SOLN
INTRAVENOUS | Status: DC | PRN
  Administered 2023-01-18 (×2): 500 mL

## 2023-01-18 MED ORDER — SODIUM CHLORIDE 0.9 % IV SOLN: 250.0000 mL | INTRAVENOUS | Status: DC | PRN

## 2023-01-18 MED ORDER — ASPIRIN 81 MG PO TBEC
81.0000 mg | DELAYED_RELEASE_TABLET | Freq: Every day | ORAL | Status: DC
  Administered 2023-01-18 – 2023-01-26 (×8): 81 mg via ORAL
  Filled 2023-01-18 (×8): qty 1

## 2023-01-18 MED ORDER — HYDRALAZINE HCL 20 MG/ML IJ SOLN: 5.0000 mg | INTRAMUSCULAR | Status: DC | PRN

## 2023-01-18 MED ORDER — SODIUM CHLORIDE 0.9 % IV SOLN: INTRAVENOUS | Status: DC

## 2023-01-18 MED ORDER — SODIUM CHLORIDE 0.9% FLUSH
3.0000 mL | Freq: Two times a day (BID) | INTRAVENOUS | Status: DC
  Administered 2023-01-19 – 2023-01-25 (×10): 3 mL via INTRAVENOUS

## 2023-01-18 MED ORDER — FENTANYL CITRATE (PF) 100 MCG/2ML IJ SOLN
INTRAMUSCULAR | Status: AC
  Filled 2023-01-18: qty 2

## 2023-01-18 MED ORDER — ACETAMINOPHEN 325 MG PO TABS: 650.0000 mg | ORAL_TABLET | ORAL | Status: DC | PRN

## 2023-01-18 MED ORDER — SODIUM CHLORIDE 0.9 % WEIGHT BASED INFUSION
1.0000 mL/kg/h | INTRAVENOUS | Status: AC
  Administered 2023-01-18 (×2): 1 mL/kg/h via INTRAVENOUS

## 2023-01-18 SURGICAL SUPPLY — 18 items
CATH ANGIO 5F PIGTAIL 65CM (CATHETERS) IMPLANT
CATH CROSS OVER TEMPO 5F (CATHETERS) IMPLANT
CATH QUICKCROSS .018X135CM (MICROCATHETER) IMPLANT
CATH STRAIGHT 5FR 65CM (CATHETERS) IMPLANT
GLIDEWIRE ADV .035X260CM (WIRE) IMPLANT
KIT MICROPUNCTURE NIT STIFF (SHEATH) IMPLANT
KIT PV (KITS) ×3 IMPLANT
SHEATH CATAPULT 6FR 60 (SHEATH) IMPLANT
SHEATH PINNACLE 5F 10CM (SHEATH) IMPLANT
SHEATH PINNACLE 6F 10CM (SHEATH) IMPLANT
SHEATH PROBE COVER 6X72 (BAG) IMPLANT
STOPCOCK MORSE 400PSI 3WAY (MISCELLANEOUS) IMPLANT
SYR MEDRAD MARK 7 150ML (SYRINGE) ×3 IMPLANT
TRANSDUCER W/STOPCOCK (MISCELLANEOUS) ×3 IMPLANT
TRAY PV CATH (CUSTOM PROCEDURE TRAY) ×3 IMPLANT
TUBING CIL FLEX 10 FLL-RA (TUBING) IMPLANT
WIRE G V18X300CM (WIRE) IMPLANT
WIRE STARTER BENTSON 035X150 (WIRE) IMPLANT

## 2023-01-18 NOTE — Op Note (Signed)
PATIENT: Sean Carter      MRN: [id number] DOB: [birth year]    DATE OF PROCEDURE: 01/18/2023  INDICATIONS:    Sean Carter is a 52 y.o. male with a diabetic foot infection.  He has had a previous right first and second toe amputation and now has developed a wound on the third toe with a draining wound.  He presents for arteriography.  PROCEDURE:    Conscious sedation Ultrasound-guided access to the left common femoral artery Aortogram with bilateral iliac arteriogram Selective catheterization of the right external iliac artery with right lower extremity runoff Attempted angioplasty of the right posterior tibial artery  SURGEON: Judeth Cornfield. Scot Dock, MD, FACS  ANESTHESIA: Local with sedation  EBL: Minimal  TECHNIQUE: The patient was brought to the peripheral vascular lab and was sedated. The period of conscious sedation was 69 minutes.  During that time period, I was present face-to-face 100% of the time.  The patient was administered 1 mg of Versed and 50 mcg of fentanyl. The patient's heart rate, blood pressure, and oxygen saturation were monitored by the nurse continuously during the procedure.  Both groins were prepped and draped in the usual sterile fashion.  Under ultrasound guidance, after the skin was anesthetized, I cannulated the left common femoral artery with a micropuncture needle and a micropuncture sheath was introduced over a wire.  This was exchanged for a 5 [place] sheath over a Bentson wire.  By ultrasound the femoral artery was patent. A real-time image was obtained and sent to the server.  A pigtail catheter was positioned at the L1 vertebral body and flush aortogram obtained.  Catheter was in position above the aortic bifurcation and an oblique iliac projection was obtained.  Next this catheter was exchanged for a crossover catheter which was positioned in the proximal right common iliac artery.  A wire was advanced into the external iliac artery and the crossover  catheter exchanged for a straight catheter.  Selective right external iliac arteriogram was obtained with right lower extremity runoff.  The patient had an occluded posterior tibial artery in order to maximize perfusion given his risk for more proximal amputation I attempted angioplasty of the right posterior tibial artery.  A Glidewire advantage was passed through the straight catheter into the superficial femoral artery and then the 5 French sheath was exchanged for a 6 [place] catapulted sheath.  This was positioned in the superficial femoral artery.  The anterior tibial artery was patent onto the foot.  The peroneal artery had some mild disease proximally and ended above the ankle.  The posterior tibial artery was occluded proximally with reconstitution of a small posterior tibial at the ankle.  I was able to selectively cannulate the proximal right posterior tibial artery with a V18 wire using a quick cross catheter for support.  I was able to get down to the mid calf but ultimately met and obstruction which could not be traversed.  Despite multiple attempts this was not successful.  I injected a small amount of contrast which showed a microperforation of a branch.  At this point I elected to stop.  The sheath was retracted into the left external iliac artery and then exchanged for a short 6 French sheath.  The patient was transferred to the holding area for removal of the sheath.  No immediate complications were noted.  FINDINGS:   There are single renal arteries bilaterally with no significant renal artery stenosis identified.  The infrarenal aorta, bilateral common iliac arteries,  bilateral external iliac arteries, and bilateral hypogastric arteries are open. I only started the right leg given his mildly elevated creatinine.  The common femoral, deep femoral, superficial femoral, popliteal, anterior tibial, tibial peroneal trunk and peroneal arteries are patent.  The peroneal has some mild disease  proximally and becomes very small distally above the ankle.  The anterior tibial artery is widely patent onto the foot.  The dorsalis pedis is somewhat small but there is a patent plantar arch.  The posterior tibial artery is occluded proximally with reconstitution distally at the ankle although here the artery is very small.  I was unable to traverse the occluded left posterior tibial artery.  CLINICAL NOTE: Based on his arteriogram I think he has a good chance of healing a transmetatarsal amputation.  However certainly given his history of diabetes despite a patent anterior tibial artery and dorsalis pedis artery there is some risk of nonhealing.  We will look at the wound in the morning.  If the cellulitis continues to improve he could potentially have a right transmetatarsal amputation tomorrow.  Otherwise we will delay this until later in the week when the cellulitis is under better control.  [name], MD, FACS [facility]  DATE OF DICTATION:   01/18/2023

## 2023-01-18 NOTE — Progress Notes (Signed)
ACT 174 at 14:15.  71f sheath removed from left femoral artery @ 1430.  Manual pressure applied to left groin for 30 minutes.  Groin is level 0 pre and post sheath pull.  Doppled left DP.  BP 140/79, HR 75.  Post sheath removal instructions given.  Patient acknowledges and understands.  Tegaderm dressing applied to site.

## 2023-01-18 NOTE — Progress Notes (Signed)
ABI study completed.   Please see CV Procedures for preliminary results.  Sofya Moustafa, RVT  4:12 PM 01/18/23

## 2023-01-18 NOTE — Inpatient Diabetes Management (Signed)
Inpatient Diabetes Program Recommendations  AACE/ADA: New Consensus Statement on Inpatient Glycemic Control (2015)  Target Ranges:  Prepandial:   less than 140 mg/dL      Peak postprandial:   less than 180 mg/dL (1-2 hours)      Critically ill patients:  140 - 180 mg/dL   Lab Results  Component Value Date   GLUCAP 153 (H) 01/18/2023   HGBA1C 7.2 (H) 01/16/2023    Review of Glycemic Control  Diabetes history: DM 2 Outpatient Diabetes medications: Basaglar 28 units, Humalog 8 units tid Current orders for Inpatient glycemic control:  Novolog 0-15 units tid + hs  A1c 7.2% on 3/30  Inpatient Diabetes Program Recommendations:    Note: glucose trends increase after meal intake.  -   Consider adding Novolog 3 units tid meal coverage when eating >50% of meals  Thanks,  Tama Headings RN, MSN, BC-ADM Inpatient Diabetes Coordinator Team Pager [phone] (8a-5p)

## 2023-01-18 NOTE — Progress Notes (Signed)
Daily Progress Note Intern Pager: [phone]  Patient name: Sean Carter Medical record number: [id number] Date of birth: [birth year] Age: 52 y.o. Gender: male  Primary Care Provider: Gerrit Heck, MD Consultants: Vascular surgery Code Status: FULL   Pt Overview and Major Events to Date:  3/29-admitted   Assessment and Plan: Sean Carter is a 52 y.o. male PMH significant for DM2, HTN, and PAD presenting meeting sepsis criteria with worsening R diabetic foot ulcer.  Diabetic infection of right foot Patient w/ diabetic foot wound that evolved quickly over 2-3 days. XR confirmed infection w/o signs of osteomyelitis. VVS recommend arteriogram to determine need for BKA vs TMA. Plan for angiogram 4/1, w/ later surgery to follow. Will continue abx until source control achieved.  -VVS following, appreciate recs -Continue vanc and cefepime (3/29- ) -F/u Bcx and AM CBC, CMP -Angiogram today -NPO Sunday 3/31 at midnight -Pain regimen: Tylenol 650mg  q6h sch, Oxycodone 5mg  q6h prn -Consult PT/OT as needed when infection improved  AKI (acute kidney injury) Patient noted to have Cr of 1.50 on admission, today it is 1.21. Baseline Cr ~ 1.1 in 2023.  Likely prerenal.  AKI vs chronic worsening renal function.  -Trend BMP on IVF -Hold ARB for AKI -Avoid nephrotoxic medications  Hypertension associated with diabetes BP normotensive. -Continue home Amlodipine 5 mg / HCTZ 12.5 mg -Hold Irbesartan 150 mg in setting of AKI  Diabetes mellitus type 2 with peripheral artery disease A1c 7.2. Home meds Basaglar 28U daily and Humalog 8U TID. CBG range has been 136-219. -mSSI + qhs coverage given moderate glucose control -CBG checks  Restrictive lung disease Mild restrictive lung disease with response to albuterol on PFTs in 08/2022. Currently endorses persistent, non-productive cough. No wheezing noted. CXR negative for acute process. -Albuterol neb once then q6h prn -Continue home  Montelukast -Tessalon perles -Add claritin daily  FEN/GI: Carb modified, NPO PPx: lovenox Dispo:pending angiography today for level of amputation needed  Subjective:  Doing well this morning. No pain in RLE. Has still had a cough that is nagging and that he feels comes and goes when he gets sick like this.  Objective: Temp:  [97.9 F (36.6 C)-100.6 F (38.1 C)] 97.9 F (36.6 C) (04/01 0830) Pulse Rate:  [68-83] 68 (04/01 0830) Resp:  [16-21] 21 (04/01 0830) BP: (124-144)/(68-77) 137/77 (04/01 0830) SpO2:  [94 %-98 %] 98 % (04/01 0830) Physical Exam: General: Alert and oriented, in NAD HEENT: NCAT, EOM grossly normal, midline nasal septum Cardiac: RRR, no m/r/g appreciated Respiratory: CTAB, breathing and speaking comfortably on RA Abdominal: Soft, nontender, nondistended, normoactive bowel sounds Extremities: Moves all extremities grossly equally, right foot bandaged at the level of the ankle with mild swelling, purulence appreciated around second and third toes with serous fluid through bandages overlying plantar surface Neurological: No gross focal deficit Psychiatric: Appropriate mood and affect   Laboratory: Most recent CBC Lab Results  Component Value Date   WBC 12.1 (H) 01/18/2023   HGB 9.3 (L) 01/18/2023   HCT 28.2 (L) 01/18/2023   MCV 86.0 01/18/2023   PLT 326 01/18/2023   Most recent BMP    Latest Ref Rng & Units 01/18/2023    1:04 AM  BMP  Glucose 70 - 99 mg/dL 198   BUN 6 - 20 mg/dL 33   Creatinine 0.61 - 1.24 mg/dL 1.21   Sodium 135 - 145 mmol/L 131   Potassium 3.5 - 5.1 mmol/L 3.8   Chloride 98 - 111 mmol/L 95  CO2 22 - 32 mmol/L 24   Calcium 8.9 - 10.3 mg/dL 8.6    CBG 153  Ethelene Hal, MD 01/18/2023, 9:34 AM PGY-1, [place] Intern pager: [phone], text pages welcome Secure chat group [place]

## 2023-01-18 NOTE — Interval H&P Note (Signed)
History and Physical Interval Note:  01/18/2023 9:58 AM  Sean Carter  has presented today for surgery, with the diagnosis of claudication.  The various methods of treatment have been discussed with the patient and family. After consideration of risks, benefits and other options for treatment, the patient has consented to  Procedure(s): ABDOMINAL AORTOGRAM W/LOWER EXTREMITY (N/A) as a surgical intervention.  The patient's history has been reviewed, patient examined, no change in status, stable for surgery.  I have reviewed the patient's chart and labs.  Questions were answered to the patient's satisfaction.     [name]

## 2023-01-19 ENCOUNTER — Encounter (HOSPITAL_COMMUNITY): Payer: Self-pay | Admitting: Vascular Surgery

## 2023-01-19 ENCOUNTER — Inpatient Hospital Stay (HOSPITAL_COMMUNITY): Payer: Self-pay | Admitting: Anesthesiology

## 2023-01-19 ENCOUNTER — Other Ambulatory Visit: Payer: Self-pay

## 2023-01-19 ENCOUNTER — Encounter (HOSPITAL_COMMUNITY)
Admission: EM | Disposition: A | Discharge status: Home / Self Care | Payer: Self-pay | Source: Home / Self Care | Attending: Family Medicine

## 2023-01-19 DIAGNOSIS — L97529 Non-pressure chronic ulcer of other part of left foot with unspecified severity: Secondary | ICD-10-CM

## 2023-01-19 DIAGNOSIS — E1151 Type 2 diabetes mellitus with diabetic peripheral angiopathy without gangrene: Secondary | ICD-10-CM

## 2023-01-19 DIAGNOSIS — E11621 Type 2 diabetes mellitus with foot ulcer: Secondary | ICD-10-CM

## 2023-01-19 DIAGNOSIS — Z794 Long term (current) use of insulin: Secondary | ICD-10-CM

## 2023-01-19 DIAGNOSIS — I10 Essential (primary) hypertension: Secondary | ICD-10-CM

## 2023-01-19 HISTORY — PX: TRANSMETATARSAL AMPUTATION: SHX6197

## 2023-01-19 LAB — LIPID PANEL
Cholesterol: 103 mg/dL (ref 0–200)
HDL: 19 mg/dL — ABNORMAL LOW (ref >40)
LDL Cholesterol: 53 mg/dL (ref 0–99)
Total CHOL/HDL Ratio: 5.4 RATIO
Triglycerides: 154 mg/dL — ABNORMAL HIGH (ref <150)
VLDL: 31 mg/dL (ref 0–40)

## 2023-01-19 LAB — GLUCOSE, CAPILLARY
Glucose-Capillary: 180 mg/dL — ABNORMAL HIGH (ref 70–99)
Glucose-Capillary: 160 mg/dL — ABNORMAL HIGH (ref 70–99)
Glucose-Capillary: 147 mg/dL — ABNORMAL HIGH (ref 70–99)
Glucose-Capillary: 161 mg/dL — ABNORMAL HIGH (ref 70–99)
Glucose-Capillary: 222 mg/dL — ABNORMAL HIGH (ref 70–99)

## 2023-01-19 LAB — BASIC METABOLIC PANEL
Anion gap: 13 (ref 5–15)
BUN: 26 mg/dL — ABNORMAL HIGH (ref 6–20)
CO2: 23 mmol/L (ref 22–32)
Calcium: 8.5 mg/dL — ABNORMAL LOW (ref 8.9–10.3)
Chloride: 97 mmol/L — ABNORMAL LOW (ref 98–111)
Creatinine, Ser: 0.99 mg/dL (ref 0.61–1.24)
GFR, Estimated: >60 mL/min (ref >60)
Glucose, Bld: 182 mg/dL — ABNORMAL HIGH (ref 70–99)
Potassium: 4.4 mmol/L (ref 3.5–5.1)
Sodium: 133 mmol/L — ABNORMAL LOW (ref 135–145)

## 2023-01-19 LAB — CBC
HCT: 28.3 % — ABNORMAL LOW (ref 39.0–52.0)
Hemoglobin: 9.3 g/dL — ABNORMAL LOW (ref 13.0–17.0)
MCH: 28.4 pg (ref 26.0–34.0)
MCHC: 32.9 g/dL (ref 30.0–36.0)
MCV: 86.5 fL (ref 80.0–100.0)
Platelets: 363 10*3/uL (ref 150–400)
RBC: 3.27 MIL/uL — ABNORMAL LOW (ref 4.22–5.81)
RDW: 12.6 % (ref 11.5–15.5)
WBC: 11.9 10*3/uL — ABNORMAL HIGH (ref 4.0–10.5)
nRBC: 0 % (ref 0.0–0.2)

## 2023-01-19 SURGERY — AMPUTATION, FOOT, TRANSMETATARSAL
Anesthesia: General | Site: Toe | Laterality: Right

## 2023-01-19 MED ORDER — OXYCODONE HCL 5 MG PO TABS: 5.0000 mg | ORAL_TABLET | Freq: Once | ORAL | Status: DC | PRN

## 2023-01-19 MED ORDER — MIDAZOLAM HCL 2 MG/2ML IJ SOLN
INTRAMUSCULAR | Status: AC
  Filled 2023-01-19: qty 2

## 2023-01-19 MED ORDER — MIDAZOLAM HCL 2 MG/2ML IJ SOLN
INTRAMUSCULAR | Status: DC | PRN
  Administered 2023-01-19: 1 mg via INTRAVENOUS

## 2023-01-19 MED ORDER — PROPOFOL 10 MG/ML IV BOLUS
INTRAVENOUS | Status: DC | PRN
  Administered 2023-01-19: 180 mg via INTRAVENOUS

## 2023-01-19 MED ORDER — FENTANYL CITRATE (PF) 250 MCG/5ML IJ SOLN
INTRAMUSCULAR | Status: AC
  Filled 2023-01-19: qty 5

## 2023-01-19 MED ORDER — BACITRACIN ZINC 500 UNIT/GM EX OINT
TOPICAL_OINTMENT | CUTANEOUS | Status: AC
  Filled 2023-01-19: qty 28.35

## 2023-01-19 MED ORDER — 0.9 % SODIUM CHLORIDE (POUR BTL) OPTIME
TOPICAL | Status: DC | PRN
  Administered 2023-01-19: 1000 mL

## 2023-01-19 MED ORDER — LIDOCAINE 2% (20 MG/ML) 5 ML SYRINGE
INTRAMUSCULAR | Status: DC | PRN
  Administered 2023-01-19: 100 mg via INTRAVENOUS

## 2023-01-19 MED ORDER — CHLORHEXIDINE GLUCONATE 0.12 % MT SOLN
OROMUCOSAL | Status: AC
  Administered 2023-01-19: 15 mL via OROMUCOSAL
  Filled 2023-01-19: qty 15

## 2023-01-19 MED ORDER — MENTHOL 3 MG MT LOZG
1.0000 | LOZENGE | OROMUCOSAL | Status: DC | PRN
  Administered 2023-01-19 – 2023-01-25 (×2): 3 mg via ORAL
  Filled 2023-01-19: qty 9

## 2023-01-19 MED ORDER — ONDANSETRON HCL 4 MG/2ML IJ SOLN
4.0000 mg | Freq: Once | INTRAMUSCULAR | Status: AC
  Administered 2023-01-19: 4 mg via INTRAVENOUS
  Filled 2023-01-19: qty 2

## 2023-01-19 MED ORDER — PROPOFOL 10 MG/ML IV BOLUS
INTRAVENOUS | Status: AC
  Filled 2023-01-19: qty 20

## 2023-01-19 MED ORDER — ORAL CARE MOUTH RINSE: 15.0000 mL | Freq: Once | OROMUCOSAL | Status: AC

## 2023-01-19 MED ORDER — GLYCOPYRROLATE 0.2 MG/ML IJ SOLN
INTRAMUSCULAR | Status: DC | PRN
  Administered 2023-01-19 (×2): .2 mg via INTRAVENOUS

## 2023-01-19 MED ORDER — VANCOMYCIN HCL 1750 MG/350ML IV SOLN
1750.0000 mg | INTRAVENOUS | Status: DC
  Administered 2023-01-20: 1750 mg via INTRAVENOUS
  Filled 2023-01-19 (×2): qty 350

## 2023-01-19 MED ORDER — EPHEDRINE SULFATE-NACL 50-0.9 MG/10ML-% IV SOSY
PREFILLED_SYRINGE | INTRAVENOUS | Status: DC | PRN
  Administered 2023-01-19 (×2): 5 mg via INTRAVENOUS

## 2023-01-19 MED ORDER — ONDANSETRON HCL 4 MG/2ML IJ SOLN: 4.0000 mg | Freq: Once | INTRAMUSCULAR | Status: DC | PRN

## 2023-01-19 MED ORDER — LOPERAMIDE HCL 2 MG PO CAPS: 4.0000 mg | ORAL_CAPSULE | Freq: Once | ORAL | Status: DC

## 2023-01-19 MED ORDER — LIDOCAINE HCL (PF) 1 % IJ SOLN
INTRAMUSCULAR | Status: AC
  Filled 2023-01-19: qty 30

## 2023-01-19 MED ORDER — INSULIN ASPART 100 UNIT/ML IJ SOLN
0.0000 [IU] | Freq: Three times a day (TID) | INTRAMUSCULAR | Status: DC
  Administered 2023-01-19 – 2023-01-20 (×2): 4 [IU] via SUBCUTANEOUS
  Administered 2023-01-20: 11 [IU] via SUBCUTANEOUS
  Administered 2023-01-20: 7 [IU] via SUBCUTANEOUS
  Administered 2023-01-21: 4 [IU] via SUBCUTANEOUS
  Administered 2023-01-21: 7 [IU] via SUBCUTANEOUS
  Administered 2023-01-21: 4 [IU] via SUBCUTANEOUS
  Administered 2023-01-22 – 2023-01-23 (×3): 11 [IU] via SUBCUTANEOUS
  Administered 2023-01-23 – 2023-01-24 (×3): 7 [IU] via SUBCUTANEOUS
  Administered 2023-01-24 (×2): 11 [IU] via SUBCUTANEOUS
  Administered 2023-01-25 – 2023-01-26 (×4): 4 [IU] via SUBCUTANEOUS

## 2023-01-19 MED ORDER — OXYCODONE HCL 5 MG/5ML PO SOLN: 5.0000 mg | Freq: Once | ORAL | Status: DC | PRN

## 2023-01-19 MED ORDER — LACTATED RINGERS IV SOLN: INTRAVENOUS | Status: DC | PRN

## 2023-01-19 MED ORDER — LACTATED RINGERS IV SOLN: INTRAVENOUS | Status: DC

## 2023-01-19 MED ORDER — VANCOMYCIN HCL 500 MG/100ML IV SOLN
500.0000 mg | Freq: Once | INTRAVENOUS | Status: AC
  Administered 2023-01-19: 500 mg via INTRAVENOUS
  Filled 2023-01-19: qty 100

## 2023-01-19 MED ORDER — CHLORHEXIDINE GLUCONATE 0.12 % MT SOLN: 15.0000 mL | Freq: Once | OROMUCOSAL | Status: AC

## 2023-01-19 MED ORDER — FENTANYL CITRATE (PF) 250 MCG/5ML IJ SOLN
INTRAMUSCULAR | Status: DC | PRN
  Administered 2023-01-19 (×4): 50 ug via INTRAVENOUS

## 2023-01-19 MED ORDER — FENTANYL CITRATE (PF) 100 MCG/2ML IJ SOLN: 25.0000 ug | INTRAMUSCULAR | Status: DC | PRN

## 2023-01-19 SURGICAL SUPPLY — 44 items
BAG COUNTER SPONGE SURGICOUNT (BAG) ×2 IMPLANT
BAG SPNG CNTER NS LX DISP (BAG) ×1
BLADE AVERAGE 25X9 (BLADE) ×2 IMPLANT
BLADE SAW SGTL 81X20 HD (BLADE) IMPLANT
BNDG ELASTIC 4X5.8 VLCR STR LF (GAUZE/BANDAGES/DRESSINGS) ×2 IMPLANT
BNDG GAUZE DERMACEA FLUFF 4 (GAUZE/BANDAGES/DRESSINGS) ×2 IMPLANT
BNDG GZE 12X3 1 PLY HI ABS (GAUZE/BANDAGES/DRESSINGS)
BNDG GZE DERMACEA 4 6PLY (GAUZE/BANDAGES/DRESSINGS) ×1
BNDG STRETCH GAUZE 3IN X12FT (GAUZE/BANDAGES/DRESSINGS) IMPLANT
CANISTER SUCT 3000ML PPV (MISCELLANEOUS) ×2 IMPLANT
CLIP LIGATING EXTRA MED SLVR (CLIP) ×2 IMPLANT
CLIP LIGATING EXTRA SM BLUE (MISCELLANEOUS) ×2 IMPLANT
COVER SURGICAL LIGHT HANDLE (MISCELLANEOUS) ×2 IMPLANT
DRAPE DERMATAC (DRAPES) IMPLANT
DRAPE EXTREMITY T 121X128X90 (DISPOSABLE) IMPLANT
DRAPE HALF SHEET 40X57 (DRAPES) ×2 IMPLANT
DRAPE ORTHO SPLIT 77X108 STRL (DRAPES) ×2
DRAPE SURG ORHT 6 SPLT 77X108 (DRAPES) IMPLANT
DRSG VAC GRANUFOAM SM (GAUZE/BANDAGES/DRESSINGS) IMPLANT
ELECT REM PT RETURN 9FT ADLT (ELECTROSURGICAL) ×1
ELECTRODE REM PT RTRN 9FT ADLT (ELECTROSURGICAL) ×2 IMPLANT
GAUZE SPONGE 4X4 12PLY STRL (GAUZE/BANDAGES/DRESSINGS) ×2 IMPLANT
GLOVE BIO SURGEON STRL SZ7.5 (GLOVE) ×2 IMPLANT
GOWN STRL REUS W/ TWL LRG LVL3 (GOWN DISPOSABLE) ×4 IMPLANT
GOWN STRL REUS W/ TWL XL LVL3 (GOWN DISPOSABLE) ×2 IMPLANT
GOWN STRL REUS W/TWL LRG LVL3 (GOWN DISPOSABLE) ×2
GOWN STRL REUS W/TWL XL LVL3 (GOWN DISPOSABLE) ×1
KIT BASIN OR (CUSTOM PROCEDURE TRAY) ×2 IMPLANT
KIT TURNOVER KIT B (KITS) ×2 IMPLANT
NDL HYPO 25GX1X1/2 BEV (NEEDLE) IMPLANT
NEEDLE HYPO 25GX1X1/2 BEV (NEEDLE) IMPLANT
NS IRRIG 1000ML POUR BTL (IV SOLUTION) ×2 IMPLANT
PACK GENERAL/GYN (CUSTOM PROCEDURE TRAY) ×2 IMPLANT
PAD ARMBOARD 7.5X6 YLW CONV (MISCELLANEOUS) ×4 IMPLANT
POWDER SURGICEL 3.0 GRAM (HEMOSTASIS) IMPLANT
SPECIMEN JAR SMALL (MISCELLANEOUS) ×2 IMPLANT
SUT ETHILON 3 0 PS 1 (SUTURE) ×2 IMPLANT
SUT SILK 2 0 (SUTURE) ×1
SUT SILK 2-0 18XBRD TIE 12 (SUTURE) IMPLANT
SYR CONTROL 10ML LL (SYRINGE) IMPLANT
TOWEL GREEN STERILE (TOWEL DISPOSABLE) ×4 IMPLANT
TOWEL GREEN STERILE FF (TOWEL DISPOSABLE) ×2 IMPLANT
UNDERPAD 30X36 HEAVY ABSORB (UNDERPADS AND DIAPERS) ×2 IMPLANT
WATER STERILE IRR 1000ML POUR (IV SOLUTION) ×2 IMPLANT

## 2023-01-19 NOTE — Op Note (Signed)
    Patient name: Sean Carter MRN: [id number] DOB: 10-Aug-1971 Sex: male  01/19/2023 Pre-operative Diagnosis: Infected right foot diabetic ulceration Post-operative diagnosis:  Same Surgeon:  Erlene Quan C. Donzetta Matters, MD Assistant: Laurence Slate, PA Procedure Performed: Open right transmetatarsal amputation with wound VAC temporary closure  Indications: 52 year old male with history of first and second toe amputations on the right due to diabetic ulceration.  He now has cellulitis with purulent draining from infected diabetic ulceration overlying the third and fourth metatarsal head and he is indicated for transmetatarsal amputation.  We have discussed the need for possible closure by secondary intention or delayed primary closure.  An experienced assistant was necessary to facilitate exposure of the metatarsal bones as well as transection and placement of wound VAC.  Findings: There was purulence draining from the plantar aspect of the ulceration this was sent for aerobic and anaerobic culture.  Given the surrounding cellulitis and the underlying purulence I elected to not close the wound today and he will be brought back in 48 to 72 hours for wound washout and possible closure.   Procedure:  The patient was identified in the holding area and taken to the operating room where he was put supine operative table and LMA anesthesia was induced.  He was to the prepped draped in the right foot and ankle in the usual fashion, antibiotics were ministered a timeout was called.  With initial evaluation there was draining purulence and this was cultured both aerobic and anaerobic and sent for specimen.  I then made a fist mall type incision around the previously removed first and second toes as well as the ulceration.  We dissected down to the level of bone was these were transected with oscillating saw and the remaining toes and first and second metatarsal heads were excised and removed.  I then smoothed the bones with  rasp thoroughly irrigated the wound and debrided back to healthy appearing bleeding tissue and hemostasis was obtained with cautery.  I elected to not close the wound today given the underlying purulence that was initially present and the cellulitis.  I then fashioned a small wound VAC and placed this to -125 mmHg.  Patient was awakened from anesthesia having tolerated the procedure without immediate complication.  All counts were correct at completion.  EBL: 25 cc.  Specimen: Aerobic anaerobic culture.    Glorimar Stroope C. Donzetta Matters, MD [facility] [address] Office: [phone] Pager: [phone]

## 2023-01-19 NOTE — Anesthesia Postprocedure Evaluation (Signed)
Anesthesia Post Note  Patient: Sean Carter  Procedure(s) Performed: TRANSMETATARSAL AMPUTATION (Right: Toe)     Patient location during evaluation: PACU Anesthesia Type: General Level of consciousness: awake and alert Pain management: pain level controlled Vital Signs Assessment: post-procedure vital signs reviewed and stable Respiratory status: spontaneous breathing, nonlabored ventilation, respiratory function stable and patient connected to nasal cannula oxygen Cardiovascular status: blood pressure returned to baseline and stable Postop Assessment: no apparent nausea or vomiting Anesthetic complications: no  No notable events documented.  Last Vitals:  Vitals:   01/19/23 1207 01/19/23 1247  BP: (!) 167/81 (!) 150/84  Pulse: 98 84  Resp: 19 (!) 23  Temp: 36.9 C   SpO2: 97% (!) 89%    Last Pain:  Vitals:   01/19/23 1200  TempSrc:   PainSc: 0-No pain                 Darianna Amy S

## 2023-01-19 NOTE — Progress Notes (Signed)
Daily Progress Note Intern Pager: [phone]  Patient name: Sean Carter Medical record number: [id number] Date of birth: [birth year] Age: 52 y.o. Gender: male  Primary Care Provider: Gerrit Heck, MD Consultants: Vascular surgery Code Status: FULL   Pt Overview and Major Events to Date:  3/29-admitted   Assessment and Plan: SOULEYMANE Carter is a 52 y.o. male PMH significant for DM2, HTN, and PAD presenting meeting sepsis criteria with worsening R diabetic foot ulcer.  Diabetic infection of right foot Arteriogram with occluded proximal posterior tibial artery. VVS states he has good chance of healing transmetatarsal amputation with continued healing of cellulitis. Plan for surgery today. Bcx remain NGTDx3 days. -VVS following, appreciate recs -Continue vanc and cefepime (3/29- ) -Pain regimen: Tylenol 650mg  q6h sch, Oxycodone 5mg  q6h prn -Consult PT/OT as needed when infection improved  AKI (acute kidney injury) Resolved at 0.99; patient noted to have Cr of 1.50 on admission. Baseline Cr ~ 1.1 in 2023. Likely prerenal. AKI vs chronic worsening renal function.  -AM BMP -BP acceptable, consider adding back ARB -Consider stopping IVF once resumes PO after surgery  Hypertension associated with diabetes BP mildly hypertensive. Acceptable control while in hospital. -Continue home Amlodipine 5 mg / HCTZ 12.5 mg -Consider adding back ARB after surgery  Diabetes mellitus type 2 with peripheral artery disease A1c 7.2. Home meds Basaglar 28U daily and Humalog 8U TID. CBG range has been 136-274 on mSSI here in hospital while PO decreased and NPO for surgery. Started on statin here. -Consider switching to rSSI vs mSSI + qhs coverage for CBG control -CBG checks -Continue atorvastatin 40 mg daily  Restrictive lung disease Mild restrictive lung disease with response to albuterol on PFTs in 08/2022. Currently endorses persistent, non-productive cough. No wheezing noted. CXR negative for  acute process. -Albuterol q6h prn -Continue home Montelukast -Tessalon perles prn -Continue claritin daily  FEN/GI: Carb modified, NPO, will add one dose imodium for loose stool after dinner yesterday PPx: lovenox Dispo:pending VVS intervention  Subjective:  Doing well this morning. Would like to ensure his insulin dosing is correct. Is ready for surgery today without further questions.  Objective: Temp:  [97.9 F (36.6 C)-98.9 F (37.2 C)] 98.9 F (37.2 C) (04/02 0616) Pulse Rate:  [68-82] 72 (04/02 0616) Resp:  [13-24] 18 (04/02 0616) BP: (113-148)/(65-81) 145/78 (04/02 0616) SpO2:  [93 %-100 %] 93 % (04/02 0616) Physical Exam: General: Alert and oriented, in NAD Skin: Warm, dry, and intact without lesions HEENT: NCAT, EOM grossly normal, midline nasal septum Cardiac: RRR, no m/r/g appreciated Respiratory: CTAB, breathing and speaking comfortably on RA Abdominal: Soft, nontender, nondistended Extremities: Moves all extremities grossly equally, RLE bandaged well with gauze and ACE wrap Neurological: No gross focal deficit Psychiatric: Appropriate mood and affect   Laboratory: Most recent CBC Lab Results  Component Value Date   WBC 11.9 (H) 01/19/2023   HGB 9.3 (L) 01/19/2023   HCT 28.3 (L) 01/19/2023   MCV 86.5 01/19/2023   PLT 363 01/19/2023   Most recent BMP    Latest Ref Rng & Units 01/19/2023    4:43 AM  BMP  Glucose 70 - 99 mg/dL 182   BUN 6 - 20 mg/dL 26   Creatinine 0.61 - 1.24 mg/dL 0.99   Sodium 135 - 145 mmol/L 133   Potassium 3.5 - 5.1 mmol/L 4.4   Chloride 98 - 111 mmol/L 97   CO2 22 - 32 mmol/L 23   Calcium 8.9 - 10.3 mg/dL 8.5  Lipid Panel     Component Value Date/Time   CHOL 103 01/19/2023 0443   CHOL 171 11/17/2021 1732   TRIG 154 (H) 01/19/2023 0443   HDL 19 (L) 01/19/2023 0443   HDL 41 11/17/2021 1732   CHOLHDL 5.4 01/19/2023 0443   VLDL 31 01/19/2023 0443   LDLCALC 53 01/19/2023 0443   LDLCALC 90 11/17/2021 1732   LABVLDL 40  11/17/2021 1732   Sean Lilac Hoff, MD 01/19/2023, 7:49 AM PGY-1, [place] Intern pager: [phone], text pages welcome Secure chat group [place]

## 2023-01-19 NOTE — Progress Notes (Signed)
Pharmacy Antibiotic Note  MELBERN CEFALO is a 52 y.o. male admitted on 01/15/2023 with diabetic foot wound infection awaiting angiography for amputation.  Pharmacy has been consulted for vancomycin and cefepime dosing. Patient presented with AKI on admission, SCr improving and now at baseline.   Plan: Increase Vancomycin 1750 mg IV Q24H (eAUC 515.2, Scr 0.99) Continue cefepime 2g IV Q8H Monitor renal function, clinical progression and source control with surgical management  Height: 5\' 8"  (172.7 cm) Weight: 91.2 kg (201 lb) IBW/kg (Calculated) : 68.4  Temp (24hrs), Avg:98.3 F (36.8 C), Min:97.9 F (36.6 C), Max:98.9 F (37.2 C)  Recent Labs  Lab 01/15/23 1553 01/15/23 1936 01/16/23 0001 01/17/23 0129 01/18/23 0104 01/19/23 0443  WBC 13.1*  --  17.5* 14.7* 12.1* 11.9*  CREATININE 1.50*  --  1.49* 1.39* 1.21 0.99  LATICACIDVEN 1.3 1.6  --   --   --   --   VANCORANDOM  --   --   --  6  --   --      Estimated Creatinine Clearance: 95.7 mL/min (by C-G formula based on SCr of 0.99 mg/dL).    Allergies  Allergen Reactions   Scallops [Shellfish Allergy] Anaphylaxis   Penicillins Other (See Comments)    Unknown reaction as a child   Dose adjustments: Vancomycin 1250 Q24h > 1750 Q24h  Cultures: 3/29 Bcx X2: ngtd  Thank you for allowing pharmacy to be a part of this patient's care. Titus Dubin, PharmD PGY1 Pharmacy Resident 01/19/2023 7:48 AM

## 2023-01-19 NOTE — Transfer of Care (Signed)
Immediate Anesthesia Transfer of Care Note  Patient: Sean Carter  Procedure(s) Performed: TRANSMETATARSAL AMPUTATION (Right: Toe)  Patient Location: PACU  Anesthesia Type:General  Level of Consciousness: awake, alert , and oriented  Airway & Oxygen Therapy: Patient Spontanous Breathing and Patient connected to nasal cannula oxygen  Post-op Assessment: Report given to RN, Post -op Vital signs reviewed and stable, and Patient moving all extremities X 4  Post vital signs: Reviewed and stable  Last Vitals:  Vitals Value Taken Time  BP 146/84 01/19/23 1139  Temp    Pulse 96 01/19/23 1141  Resp 16 01/19/23 1141  SpO2 97 % 01/19/23 1141  Vitals shown include unvalidated device data.  Last Pain:  Vitals:   01/19/23 0915  TempSrc: Oral  PainSc:       Patients Stated Pain Goal: 4 ([phone] [id number])  Complications: No notable events documented.

## 2023-01-19 NOTE — Progress Notes (Addendum)
      Subjective  - He has no pain or new complaints   Objective (!) 145/78 72 98.9 F (37.2 C) (Oral) 18 93%  Intake/Output Summary (Last 24 hours) at 01/19/2023 0704 Last data filed at 01/19/2023 0600 Gross per 24 hour  Intake 1200 ml  Output 1500 ml  Net -300 ml         Assessment/Planning: Non healing third toe wound with DM and PAD  POD # 1 diagnostic angiogram   Continue current IV antibiotics Cefepime and Vancomyacin Leukocytosis continues to improve.  Cellulitis has improved    Dr. Nicole Cella CLINICAL NOTE: Based on his arteriogram I think he has a good chance of healing a transmetatarsal amputation.  However certainly given his history of diabetes despite a patent anterior tibial artery and dorsalis pedis artery there is some risk of nonhealing.  We will look at the wound in the morning.  If the cellulitis continues to improve he could potentially have a right transmetatarsal amputation tomorrow.  Otherwise we will delay this until later in the week when the cellulitis is under better control.   Pending surgical intervention and discussion with Dr. Fuller Plan 01/19/2023 7:04 AM --  Laboratory Lab Results: Recent Labs    01/18/23 0104 01/19/23 0443  WBC 12.1* 11.9*  HGB 9.3* 9.3*  HCT 28.2* 28.3*  PLT 326 363   BMET Recent Labs    01/18/23 0104 01/19/23 0443  NA 131* 133*  K 3.8 4.4  CL 95* 97*  CO2 24 23  GLUCOSE 198* 182*  BUN 33* 26*  CREATININE 1.21 0.99  CALCIUM 8.6* 8.5*    COAG Lab Results  Component Value Date   INR 1.1 05/30/2020   INR 0.9 01/10/2008   No results found for: "PTT"   I have independently interviewed and examined patient and agree with PA assessment and plan above. Plan for right TMA possibly to be left open for healing by secondary intention or delayed primary closure.   Caidyn Henricksen C. Donzetta Matters, MD [facility] [place] Office: [phone] Pager: [phone]

## 2023-01-19 NOTE — ED Provider Notes (Signed)
[facility]    CSN: [id number] Arrival date & time: 01/15/23  1252      History   Chief Complaint Chief Complaint  Patient presents with   Foot Pain    Entered by patient    HPI Sean Carter is a 52 y.o. male.   Patient presenting today with fever, significant lethargy, chills, sweats, cough and worsening right foot pain for the past 2 days.  States he had 2 toes amputated on 07/24/2022 and the area has been worsening over the past few days.  It is now red and blistered.  Denies chest pain, shortness of breath, congestion, sore throat, loss of range of motion in the foot.  Wife states she has been performing home wound care daily on the foot and that it just started draining yesterday.  He has a history of diabetes and gangrene to the toes of the foot.  History of restrictive lung disease on albuterol, has not used his inhaler or nebulizer today.   Past Medical History:  Diagnosis Date   Blurry vision 07/10/2020   Diabetes mellitus without complication    Gangrene of toe of left foot    Sepsis due to Streptococcus, group B 06/20/2020   Streptococcal bacteremia     Patient Active Problem List   Diagnosis Date Noted   AKI (acute kidney injury) 01/16/2023   Diabetic infection of right foot 01/16/2023   Cellulitis of right foot 01/16/2023   Open wound of plantar aspect of foot 01/16/2023   Restrictive lung disease 01/15/2023   History of wheezing 08/28/2022   Symptoms of gastroesophageal reflux 08/28/2022   Necrotic toes 07/20/2022   Gangrene of toe 07/20/2022   Leukocytosis 07/20/2022   Healthcare maintenance 06/29/2022   Diabetic retinopathy 06/29/2022   Mixed hyperlipidemia 11/20/2021   Blurry vision 07/10/2020   Sepsis due to Streptococcus, group B 06/20/2020   Superficial vein thrombosis 06/06/2020   Hypertension associated with diabetes 06/06/2020   Diabetes mellitus type 2 with peripheral artery disease 06/06/2020   Gangrene of toe of left foot     Amputation of left great toe    Hypokalemia 05/30/2020   Past Surgical History:  Procedure Laterality Date   ABDOMINAL AORTOGRAM W/LOWER EXTREMITY N/A 01/18/2023   Procedure: ABDOMINAL AORTOGRAM W/LOWER EXTREMITY;  Surgeon: Angelia Mould, MD;  Location: [facility];  Service: Cardiovascular;  Laterality: N/A;   AMPUTATION Left 05/31/2020   Procedure: Ray Amputation of Left Great Toe with Negative Pressure Vac Placement;  Surgeon: Marty Heck, MD;  Location: [facility];  Service: Vascular;  Laterality: Left;   AMPUTATION Right 07/22/2022   Procedure: RIGHT FIRST AND SECOND RAY PARTIAL  AMPUTATION;  Surgeon: [name], MD;  Location: [place];  Service: Vascular;  Laterality: Right;   PERIPHERAL VASCULAR BALLOON ANGIOPLASTY  01/18/2023   Procedure: PERIPHERAL VASCULAR BALLOON ANGIOPLASTY;  Surgeon: Angelia Mould, MD;  Location: [facility];  Service: Cardiovascular;;     Home Medications    Prior to Admission medications   Medication Sig Start Date End Date Taking? Authorizing Provider  acetaminophen (TYLENOL) 500 MG tablet Take 500 mg by mouth every 6 (six) hours as needed.    [provider]  albuterol (VENTOLIN HFA) 108 (90 Base) MCG/ACT inhaler INHALE 2 PUFFS BY MOUTH EVERY 6 HOURS AS NEEDED FOR WHEEZING OR SHORTNESS OF BREATH 11/25/22   Gerrit Heck, MD  amLODIPine-Valsartan-HCTZ 5-160-12.5 MG TABS Take 1 tablet by mouth daily at 8 pm. 09/08/22  Zenia Resides, MD  atorvastatin (LIPITOR) 40 MG tablet Take 1 tablet by mouth once daily 12/24/22   Dameron, Luna Fuse, DO  Blood Gluc Meter Disp-Strips (RELION ALL-IN-ONE) DEVI 1 each by Does not apply route 3 (three) times daily as needed. 07/02/20   Brimage, Ronnette Juniper, DO  brompheniramine-pseudoephedrine-DM 30-2-10 MG/5ML syrup Take 5 mLs by mouth 4 (four) times daily as needed. 10/10/22   Leath-Warren, Alda Lea, NP  cetirizine (ZYRTEC) 10 MG tablet Take 1 tablet (10 mg total) by mouth daily. 11/06/22    Gerrit Heck, MD  diphenhydrAMINE (BENADRYL) 25 MG tablet Take 25 mg by mouth every 6 (six) hours as needed for itching, allergies or sleep.    [provider]  famotidine (PEPCID) 40 MG tablet Take 1 tablet by mouth twice daily 12/30/22   Gerrit Heck, MD  Insulin Glargine (BASAGLAR KWIKPEN) 100 UNIT/ML DIAL AND INJECT 28 UNITS UNDER THE SKIN ONCE DAILY IN THE MORNING. MAX DAILY DOSE 28 UNITS 10/16/22   Gerrit Heck, MD  insulin lispro (HUMALOG) 100 UNIT/ML injection Inject 0.08 mLs (8 Units total) into the skin 3 (three) times daily before meals. 11/20/21   Lyndee Hensen, DO  loratadine (CLARITIN) 10 MG tablet Take 1 tablet (10 mg total) by mouth daily. 09/07/22   Zenia Resides, MD  montelukast (SINGULAIR) 10 MG tablet TAKE 1 TABLET BY MOUTH AT BEDTIME 12/31/22   Gerrit Heck, MD  Multiple Vitamin (MULTIVITAMIN) tablet Take 1 tablet by mouth daily.    [provider]  ReliOn Lancets Thin 26G MISC 1 each by Does not apply route 3 (three) times daily as needed. 10/04/20   Martyn Malay, MD  traMADol (ULTRAM) 50 MG tablet Take 1 tablet (50 mg total) by mouth every 6 (six) hours as needed for severe pain. 08/20/22   Dagoberto Ligas, PA-C  ULTICARE INSULIN SYRINGE 30G X 1/2" 0.3 ML MISC See admin instructions.  06/03/20   [provider]    Family History History reviewed. No pertinent family history.  Social History Social History   Tobacco Use   Smoking status: Never    Passive exposure: Never   Smokeless tobacco: Never  Vaping Use   Vaping Use: Never used  Substance Use Topics   Drug use: Never     Allergies   Scallops [shellfish allergy] and Penicillins   Review of Systems Review of Systems PER HPI  Physical Exam Triage Vital Signs ED Triage Vitals  Enc Vitals Group     BP 01/15/23 1418 137/71     Pulse Rate 01/15/23 1418 (!) 107     Resp --      Temp 01/15/23 1418 (!) 102.9 F (39.4 C)     Temp Source 01/15/23 1418 Oral      SpO2 01/15/23 1418 95 %     Weight --      Height --      Head Circumference --      Peak Flow --      Pain Score 01/15/23 1340 8     Pain Loc --      Pain Edu? --      Excl. in [place]? --    No data found.  Updated Vital Signs BP 137/71 (BP Location: Right Arm)   Pulse (!) 107   Temp (!) 102.9 F (39.4 C) (Oral)   SpO2 95%   Visual Acuity Right Eye Distance:   Left Eye Distance:   Bilateral Distance:    Right Eye Near:  Left Eye Near:    Bilateral Near:     Physical Exam Vitals and nursing note reviewed.  Constitutional:      Appearance: He is diaphoretic.     Comments: Lethargic, intermittently falling asleep in chair throughout exam  HENT:     Head: Atraumatic.     Mouth/Throat:     Mouth: Mucous membranes are moist.     Pharynx: Oropharynx is clear. No posterior oropharyngeal erythema.  Eyes:     Extraocular Movements: Extraocular movements intact.     Conjunctiva/sclera: Conjunctivae normal.  Cardiovascular:     Rate and Rhythm: Regular rhythm. Tachycardia present.  Pulmonary:     Effort: Pulmonary effort is normal.     Breath sounds: Wheezing (trace wheezes b/l) present.  Musculoskeletal:        General: Swelling present. Normal range of motion.     Cervical back: Normal range of motion and neck supple.  Skin:    General: Skin is warm.     Findings: Erythema and lesion present.     Comments: 3 cm deep ulceration to the plantar surface of the right foot at the base of the second and third toes.  Status post amputation of first and second digit of the right foot, now with significant fluctuance, abscesses across this area.  Foul odor from wound.  Erythema and edema of the remaining toes on the foot  Neurological:     Comments: Mildly lethargic but at baseline neurologic status overall  Psychiatric:        Mood and Affect: Mood normal.        Thought Content: Thought content normal.        Judgment: Judgment normal.      UC Treatments / Results   Labs (all labs ordered are listed, but only abnormal results are displayed) Labs Reviewed  POCT INFLUENZA A/B    EKG   Radiology VAS [place] ABI WITH/WO TBI  Result Date: 01/18/2023  LOWER EXTREMITY DOPPLER STUDY Patient Name:  QUAME VUKELICH  Date of Exam:   01/18/2023 Medical Rec #: [id number]      Accession #:    [id number] Date of Birth: 25-Sep-1971     Patient Gender: M Patient Age:   62 years Exam Location:  [facility] Procedure:      VAS [place] ABI WITH/WO TBI Referring Phys: Harrell Gave DICKSON --------------------------------------------------------------------------------  Indications: Claudication, and ulceration. High Risk Factors: Hypertension, hyperlipidemia, Diabetes.  Comparison Study: Previous 07/21/22. Performing Technologist: McKayla Maag RVT, [place]  Examination Guidelines: A complete evaluation includes at minimum, Doppler waveform signals and systolic blood pressure reading at the level of bilateral brachial, anterior tibial, and posterior tibial arteries, when vessel segments are accessible. Bilateral testing is considered an integral part of a complete examination. Photoelectric Plethysmograph (PPG) waveforms and toe systolic pressure readings are included as required and additional duplex testing as needed. Limited examinations for reoccurring indications may be performed as noted.  ABI Findings: +---------+-----------------+-----+-----------+--------------------------------+ Right    Rt Pressure      IndexWaveform   Comment                                   (mmHg)                                                            +---------+-----------------+-----+-----------+--------------------------------+  Brachial 137                   triphasic                                   +---------+-----------------+-----+-----------+--------------------------------+ PTA      159              1.16 multiphasic                                  +---------+-----------------+-----+-----------+--------------------------------+ DP       192              1.40 multiphasic                                 +---------+-----------------+-----+-----------+--------------------------------+ Great Toe                                 Amputation. Second toe is                                                  absent.                          +---------+-----------------+-----+-----------+--------------------------------+ +---------+------------------+-----+-----------+-------------------------------+ Left     Lt Pressure (mmHg)IndexWaveform   Comment                         +---------+------------------+-----+-----------+-------------------------------+ Brachial                        biphasic                                   +---------+------------------+-----+-----------+-------------------------------+ PTA      178               1.30 multiphasic                                +---------+------------------+-----+-----------+-------------------------------+ DP       205               1.50 multiphasic                                +---------+------------------+-----+-----------+-------------------------------+ Great Toe76                0.55 Abnormal   Obtained on second toe due to                                              great toe amputation            +---------+------------------+-----+-----------+-------------------------------+ +-------+-----------+-----------+------------+------------+ ABI/TBIToday's ABIToday's TBIPrevious ABIPrevious TBI +-------+-----------+-----------+------------+------------+ Right  1.40                                           +-------+-----------+-----------+------------+------------+  Left   1.50       0.55                                +-------+-----------+-----------+------------+------------+   Summary: Right: Resting right ankle-brachial index indicates  noncompressible right lower extremity arteries. Ampuation of right great toe. Left: Resting left ankle-brachial index indicates noncompressible left lower extremity arteries. The left toe-brachial index is abnormal. *See table(s) above for measurements and observations.  Electronically signed by Monica Martinez MD on 01/18/2023 at 5:13:38 PM.    Final    PERIPHERAL VASCULAR CATHETERIZATION  Result Date: 01/18/2023 Table formatting from the original result was not included.  PATIENT: ABAD EDGE                                                                    MRN: [id number] DOB: 1970-11-12                                            DATE OF PROCEDURE: 01/18/2023  INDICATIONS:   DEMITRI MESECHER is a 52 y.o. male with a diabetic foot infection.  He has had a previous right first and second toe amputation and now has developed a wound on the third toe with a draining wound.  He presents for arteriography.  PROCEDURE:   Conscious sedation Ultrasound-guided access to the left common femoral artery Aortogram with bilateral iliac arteriogram Selective catheterization of the right external iliac artery with right lower extremity runoff Attempted angioplasty of the right posterior tibial artery  SURGEON: Judeth Cornfield. Scot Dock, MD, FACS  ANESTHESIA: Local with sedation  EBL: Minimal  TECHNIQUE: The patient was brought to the peripheral vascular lab and was sedated. The period of conscious sedation was 69 minutes.  During that time period, I was present face-to-face 100% of the time.  The patient was administered 1 mg of Versed and 50 mcg of fentanyl. The patient's heart rate, blood pressure, and oxygen saturation were monitored by the nurse continuously during the procedure.  Both groins were prepped and draped in the usual sterile fashion.  Under ultrasound guidance, after the skin was anesthetized, I cannulated the left common femoral artery with a micropuncture needle and a micropuncture sheath was introduced over a  wire.  This was exchanged for a 5 [place] sheath over a Bentson wire.  By ultrasound the femoral artery was patent. A real-time image was obtained and sent to the server.  A pigtail catheter was positioned at the L1 vertebral body and flush aortogram obtained.  Catheter was in position above the aortic bifurcation and an oblique iliac projection was obtained.  Next this catheter was exchanged for a crossover catheter which was positioned in the proximal right common iliac artery.  A wire was advanced into the external iliac artery and the crossover catheter exchanged for a straight catheter.  Selective right external iliac arteriogram was obtained with right lower extremity runoff.  The patient had an occluded posterior tibial artery in order to maximize perfusion given his risk for more proximal amputation I attempted angioplasty of the right posterior tibial  artery.  A Glidewire advantage was passed through the straight catheter into the superficial femoral artery and then the 5 French sheath was exchanged for a 6 [place] catapulted sheath.  This was positioned in the superficial femoral artery.  The anterior tibial artery was patent onto the foot.  The peroneal artery had some mild disease proximally and ended above the ankle.  The posterior tibial artery was occluded proximally with reconstitution of a small posterior tibial at the ankle.  I was able to selectively cannulate the proximal right posterior tibial artery with a V18 wire using a quick cross catheter for support.  I was able to get down to the mid calf but ultimately met and obstruction which could not be traversed.  Despite multiple attempts this was not successful.  I injected a small amount of contrast which showed a microperforation of a branch.  At this point I elected to stop.  The sheath was retracted into the left external iliac artery and then exchanged for a short 6 French sheath.  The patient was transferred to the holding area for removal of  the sheath.  No immediate complications were noted.  FINDINGS:  There are single renal arteries bilaterally with no significant renal artery stenosis identified.  The infrarenal aorta, bilateral common iliac arteries, bilateral external iliac arteries, and bilateral hypogastric arteries are open. I only started the right leg given his mildly elevated creatinine.  The common femoral, deep femoral, superficial femoral, popliteal, anterior tibial, tibial peroneal trunk and peroneal arteries are patent.  The peroneal has some mild disease proximally and becomes very small distally above the ankle.  The anterior tibial artery is widely patent onto the foot.  The dorsalis pedis is somewhat small but there is a patent plantar arch.  The posterior tibial artery is occluded proximally with reconstitution distally at the ankle although here the artery is very small.  I was unable to traverse the occluded left posterior tibial artery.  CLINICAL NOTE: Based on his arteriogram I think he has a good chance of healing a transmetatarsal amputation.  However certainly given his history of diabetes despite a patent anterior tibial artery and dorsalis pedis artery there is some risk of nonhealing.  We will look at the wound in the morning.  If the cellulitis continues to improve he could potentially have a right transmetatarsal amputation tomorrow.  Otherwise we will delay this until later in the week when the cellulitis is under better control.  [name], MD, FACS [facility]    Procedures Procedures (including critical care time)  Medications Ordered in UC Medications  acetaminophen (TYLENOL) tablet 1,000 mg (1,000 mg Oral Given 01/15/23 1515)    Initial Impression / Assessment and Plan / UC Course  I have reviewed the triage vital signs and the nursing notes.  Pertinent labs & imaging results that were available during my care of the patient were reviewed by me and considered  in my medical decision making (see chart for details).     Significantly febrile in triage to 102.9 F and tachycardic.  Tylenol given for fever and pain.  Rapid flu was negative, low suspicion for his cough causing fever and chest x-ray was negative for acute cardiopulmonary abnormality.  Suspect his cough and wheezing is related to his underlying restrictive lung disease and seasonal allergies, suspect that the fever and generalized symptoms are coming from severely infected foot.  X-ray of the right foot showing soft tissue gas pattern and exam  reveals a significant soft tissue infection.  Given his entire clinical picture at this time, recommended going to the emergency department for more advanced management and intervention.  Patient is ultimately agreeable to this plan and wife wishes to take him via private vehicle to the emergency department.  Wound was dressed prior to discharge.  Final Clinical Impressions(s) / UC Diagnoses   Final diagnoses:  Diabetic ulcer of toe of right foot associated with type 2 diabetes mellitus, unspecified ulcer stage ([name])  Diabetic osteomyelitis ([place])  Fever, unspecified  Tachycardia  Acute cough  Lethargy  History of amputation of toe [facility])   Discharge Instructions   None    ED Prescriptions   None    PDMP not reviewed this encounter.   Volney American, [place] 01/19/23 1138

## 2023-01-19 NOTE — Anesthesia Procedure Notes (Signed)
Procedure Name: LMA Insertion Date/Time: 01/19/2023 10:32 AM  Performed by: Mariea Clonts, CRNAPre-anesthesia Checklist: Patient identified, Emergency Drugs available, Suction available and Patient being monitored Patient Re-evaluated:Patient Re-evaluated prior to induction Oxygen Delivery Method: Circle System Utilized Preoxygenation: Pre-oxygenation with 100% oxygen Induction Type: IV induction Ventilation: Mask ventilation without difficulty LMA: LMA inserted LMA Size: 4.0 Number of attempts: 1 Airway Equipment and Method: Bite block Placement Confirmation: positive ETCO2 Tube secured with: Tape Dental Injury: Teeth and Oropharynx as per pre-operative assessment

## 2023-01-19 NOTE — Discharge Instructions (Addendum)
Dear Sean Carter,  Thank you for letting us participate in your care. You were hospitalized for a foot infection related to diabetes and diagnosed with Diabetic infection of right foot. You were treated with fluids, antibiotics, and surgery to amputate the infected part of your foot.   POST-HOSPITAL & CARE INSTRUCTIONS Take the augmentin antibiotics for 4 weeks (ending May 7th, 2024). These will help attack the infection in your foot. Take your insulin as prescribed and keep up a healthy diet. Go to your follow up appointments (listed below)  DOCTOR'S APPOINTMENT   No future appointments.  Follow-up Information     Program, [facility]. Schedule an appointment as soon as possible for a visit.   Why: Make an appointment ASAP for hospital follow up. Contact information: [address] [place] [phone]         Sean Carter, Sean Christopher, MD Follow up in 2 week(s).   Specialties: Vascular Surgery, Cardiology Why: Office will call you to arrange your appt (sent) Contact information: [address] [place] [address] [phone]                Take care and be well!  Family Medicine Teaching Service Inpatient Team   [facility]  [address] [place], [place] [address] [phone]

## 2023-01-19 NOTE — Anesthesia Preprocedure Evaluation (Signed)
Anesthesia Evaluation  Patient identified by MRN, date of birth, ID band Patient awake    Reviewed: Allergy & Precautions, H&P , NPO status , Patient's Chart, lab work & pertinent test results  Airway Mallampati: II  TM Distance: >3 FB Neck ROM: Full    Dental no notable dental hx.    Pulmonary neg pulmonary ROS   Pulmonary exam normal breath sounds clear to auscultation       Cardiovascular hypertension, + Peripheral Vascular Disease  Normal cardiovascular exam Rhythm:Regular Rate:Normal     Neuro/Psych negative neurological ROS  negative psych ROS   GI/Hepatic negative GI ROS, Neg liver ROS,,,  Endo/Other  diabetes, Insulin Dependent    Renal/GU Renal InsufficiencyRenal disease  negative genitourinary   Musculoskeletal negative musculoskeletal ROS (+)    Abdominal   Peds negative pediatric ROS (+)  Hematology  (+) Blood dyscrasia, anemia   Anesthesia Other Findings   Reproductive/Obstetrics negative OB ROS                             Anesthesia Physical Anesthesia Plan  ASA: 3  Anesthesia Plan: General   Post-op Pain Management: Minimal or no pain anticipated   Induction: Intravenous  PONV Risk Score and Plan: 2 and Ondansetron and Treatment may vary due to age or medical condition  Airway Management Planned: LMA  Additional Equipment:   Intra-op Plan:   Post-operative Plan: Extubation in OR  Informed Consent: I have reviewed the patients History and Physical, chart, labs and discussed the procedure including the risks, benefits and alternatives for the proposed anesthesia with the patient or authorized representative who has indicated his/her understanding and acceptance.     Dental advisory given  Plan Discussed with: CRNA and Surgeon  Anesthesia Plan Comments:        Anesthesia Quick Evaluation

## 2023-01-20 LAB — CBC
HCT: 27 % — ABNORMAL LOW (ref 39.0–52.0)
Hemoglobin: 8.7 g/dL — ABNORMAL LOW (ref 13.0–17.0)
MCH: 28.2 pg (ref 26.0–34.0)
MCHC: 32.2 g/dL (ref 30.0–36.0)
MCV: 87.7 fL (ref 80.0–100.0)
Platelets: 422 10*3/uL — ABNORMAL HIGH (ref 150–400)
RBC: 3.08 MIL/uL — ABNORMAL LOW (ref 4.22–5.81)
RDW: 12.7 % (ref 11.5–15.5)
WBC: 14.1 10*3/uL — ABNORMAL HIGH (ref 4.0–10.5)
nRBC: 0 % (ref 0.0–0.2)

## 2023-01-20 LAB — GLUCOSE, CAPILLARY
Glucose-Capillary: 219 mg/dL — ABNORMAL HIGH (ref 70–99)
Glucose-Capillary: 261 mg/dL — ABNORMAL HIGH (ref 70–99)
Glucose-Capillary: 174 mg/dL — ABNORMAL HIGH (ref 70–99)
Glucose-Capillary: 161 mg/dL — ABNORMAL HIGH (ref 70–99)

## 2023-01-20 LAB — CULTURE, BLOOD (ROUTINE X 2)
Culture: NO GROWTH
Culture: NO GROWTH
Special Requests: ADEQUATE
Special Requests: ADEQUATE

## 2023-01-20 LAB — BASIC METABOLIC PANEL
Anion gap: 9 (ref 5–15)
BUN: 23 mg/dL — ABNORMAL HIGH (ref 6–20)
CO2: 22 mmol/L (ref 22–32)
Calcium: 8.1 mg/dL — ABNORMAL LOW (ref 8.9–10.3)
Chloride: 98 mmol/L (ref 98–111)
Creatinine, Ser: 1.13 mg/dL (ref 0.61–1.24)
GFR, Estimated: >60 mL/min (ref >60)
Glucose, Bld: 267 mg/dL — ABNORMAL HIGH (ref 70–99)
Potassium: 4.4 mmol/L (ref 3.5–5.1)
Sodium: 129 mmol/L — ABNORMAL LOW (ref 135–145)

## 2023-01-20 MED ORDER — INSULIN ASPART 100 UNIT/ML IJ SOLN
3.0000 [IU] | Freq: Three times a day (TID) | INTRAMUSCULAR | Status: DC
  Administered 2023-01-20 – 2023-01-23 (×8): 3 [IU] via SUBCUTANEOUS

## 2023-01-20 NOTE — Progress Notes (Addendum)
[facility] [place]  Subjective  - no complaints   Objective 120/67 63 98.3 F (36.8 C) (Oral) 19 93%  Intake/Output Summary (Last 24 hours) at 01/20/2023 0708 Last data filed at 01/19/2023 2300 Gross per 24 hour  Intake 1000 ml  Output 900 ml  Net 100 ml    Wound vac to good suction right open TMA 150 ml in vac canister  Lungs non labored breathing  1 d ago   Specimen Description WOUND  Special Requests NONE  Gram Stain NO WBC SEEN MODERATE GRAM NEGATIVE RODS FEW GRAM POSITIVE COCCI IN PAIRS Performed at [facility], [place] [address], [place],  [address]  Culture PENDING  Report Status PENDING    Assessment/Planning: POD # 1 right TMA DM with non healing infected wounds  Plan for return to OR Friday for irrigation and debridement with possible primary closure Elevated leukocytosis now 14.1.  Cont IV antibiotics.  Pending final Right LE heel weight bearing darco shoe ordered.  Pending when he can ambulate now verses after primary closure?  I will discuss this with Dr. Donzetta Matters.   Roxy Horseman 01/20/2023 7:08 AM --  Laboratory Lab Results: Recent Labs    01/19/23 0443 01/20/23 0127  WBC 11.9* 14.1*  HGB 9.3* 8.7*  HCT 28.3* 27.0*  PLT 363 422*   BMET Recent Labs    01/19/23 0443 01/20/23 0127  NA 133* 129*  K 4.4 4.4  CL 97* 98  CO2 23 22  GLUCOSE 182* 267*  BUN 26* 23*  CREATININE 0.99 1.13  CALCIUM 8.5* 8.1*    COAG Lab Results  Component Value Date   INR 1.1 05/30/2020   INR 0.9 01/10/2008   No results found for: "PTT"  I have interviewed the patient and examined the patient. I agree with the findings by the PA.  VAC has a good seal.  The foot is warm.  We may potentially closed the TMA on Friday.   Gae Gallop, MD

## 2023-01-20 NOTE — Progress Notes (Signed)
     Daily Progress Note Intern Pager: [phone]  Patient name: Sean Carter Medical record number: [id number] Date of birth: 05-24-1971 Age: 52 y.o. Gender: male  Primary Care Provider: Gerrit Heck, MD Consultants: Vascular surgery Code Status: FULL   Pt Overview and Major Events to Date:  3/29-admitted 4/2-TMA   Assessment and Plan: KAHEKILI GORBETT is a 52 y.o. male PMH significant for DM2, HTN, and PAD presenting meeting sepsis criteria with worsening R diabetic foot ulcer.  Diabetic infection of right foot POD 1 TMA. Wound gram stain mod GNR and few GPC in pairs. Wound culture pending. Bcx remain NGTDx5 days. -VVS following, appreciate recs, planned washout on Friday 4/5 -Continue vanc and cefepime (3/29- ) -Pain regimen: Tylenol 650mg  q6h sch, Oxycodone 5mg  q6h prn -Consult PT/OT when procedures finished  AKI (acute kidney injury) Resolved at 0.99>1.13 after stopped IVF. Baseline Cr ~ 1.1 in 2023. -AM BMP -Continue PO  Hypertension associated with diabetes BP mildly hypertensive. Acceptable control while in hospital. -Continue home Amlodipine 5 mg / HCTZ 12.5 mg -Consider adding back ARB if continues to rise  Diabetes mellitus type 2 with peripheral artery disease A1c 7.2. Home meds Basaglar 28U daily and Humalog 8U TID. CBG range has been 147-267 on rSSI here in hospital. -Continue rSSI with QHS coverage -Add 3u TID with meals -CBG checks  Restrictive lung disease Mild restrictive lung disease with response to albuterol on PFTs in 08/2022. Currently endorses persistent, non-productive cough. No wheezing noted. CXR negative for acute process. -Albuterol q6h prn -Continue home Montelukast -Tessalon perles prn -Continue claritin daily  FEN/GI: Carb modified diet PPx: Lovenox Dispo: Pending wound washout on Friday with VVS  Subjective:  Feeling better this morning. Did have a couple episodes of emesis overnight which he feels was due to anesthesia. Eating  well this morning. In no pain right now.  Objective: Temp:  [98.3 F (36.8 C)-100.5 F (38.1 C)] 98.3 F (36.8 C) (04/03 0421) Pulse Rate:  [63-99] 63 (04/03 0421) Resp:  [11-26] 19 (04/03 0421) BP: (120-167)/(62-94) 120/67 (04/03 0421) SpO2:  [89 %-99 %] 93 % (04/03 0421) Weight:  [91.6 kg] 91.6 kg (04/02 0915) Physical Exam: General: Alert and oriented, in NAD HEENT: NCAT, EOM grossly normal, midline nasal septum Cardiac: RRR, no m/r/g appreciated Respiratory: CTAB, breathing and speaking comfortably on RA Extremities: Moves all extremities grossly equally, LL extremity WWP with post-TMA changes and wound vac attached draining dark red blood Neurological: No gross focal deficit Psychiatric: Appropriate mood and affect   Laboratory: Most recent CBC Lab Results  Component Value Date   WBC 14.1 (H) 01/20/2023   HGB 8.7 (L) 01/20/2023   HCT 27.0 (L) 01/20/2023   MCV 87.7 01/20/2023   PLT 422 (H) 01/20/2023   Most recent BMP    Latest Ref Rng & Units 01/20/2023    1:27 AM  BMP  Glucose 70 - 99 mg/dL 267   BUN 6 - 20 mg/dL 23   Creatinine 0.61 - 1.24 mg/dL 1.13   Sodium 135 - 145 mmol/L 129   Potassium 3.5 - 5.1 mmol/L 4.4   Chloride 98 - 111 mmol/L 98   CO2 22 - 32 mmol/L 22   Calcium 8.9 - 10.3 mg/dL 8.1    Ethelene Hal, MD 01/20/2023, 8:06 AM PGY-1, [place] Intern pager: [phone], text pages welcome Secure chat group [place]

## 2023-01-20 NOTE — Inpatient Diabetes Management (Signed)
Inpatient Diabetes Program Recommendations  AACE/ADA: New Consensus Statement on Inpatient Glycemic Control (2015)  Target Ranges:  Prepandial:   less than 140 mg/dL      Peak postprandial:   less than 180 mg/dL (1-2 hours)      Critically ill patients:  140 - 180 mg/dL   Lab Results  Component Value Date   GLUCAP 261 (H) 01/20/2023   HGBA1C 7.2 (H) 01/16/2023    Latest Reference Range & Units 01/19/23 06:34 01/19/23 08:58 01/19/23 11:44 01/19/23 17:22 01/19/23 20:58 01/20/23 06:11 01/20/23 11:11  Glucose-Capillary 70 - 99 mg/dL 180 (H) Novolog 3 units 160 (H) 147 (H) 161 (H) Novolog 4 units 222 (H) Novolog 2 units 219 (H) Novolog 7 units 261 (H)  (H): Data is abnormally high  Diabetes history: DM 2 Outpatient Diabetes medications: Basaglar 28 units, Humalog 8 units tid Current orders for Inpatient glycemic control:  Novolog 0-15 units tid + hs  Inpatient Diabetes Program Recommendations:   Please consider: -Semglee 14 units now and qd (50% home basal insulin dose) -Add Novolog 3 units tid meal coverage if post prandial CBGs >180.  Thank you, Nani Gasser. Brielle Moro, RN, MSN, CDE  Diabetes Coordinator Inpatient Glycemic Control Team Team Pager [phone] (8am-5pm) 01/20/2023 11:34 AM

## 2023-01-20 NOTE — Progress Notes (Signed)
Orthopedic Tech Progress Note Patient Details:  Sean Carter [birth year] [id number] Darco shoe was delivered to the patient's room.  Ortho Devices Type of Ortho Device: Darco shoe Ortho Device/Splint Interventions: Ordered      Sean Carter Sean Carter 01/20/2023, 12:38 PM

## 2023-01-21 LAB — CBC
HCT: 26.2 % — ABNORMAL LOW (ref 39.0–52.0)
Hemoglobin: 8.5 g/dL — ABNORMAL LOW (ref 13.0–17.0)
MCH: 28.4 pg (ref 26.0–34.0)
MCHC: 32.4 g/dL (ref 30.0–36.0)
MCV: 87.6 fL (ref 80.0–100.0)
Platelets: 376 10*3/uL (ref 150–400)
RBC: 2.99 MIL/uL — ABNORMAL LOW (ref 4.22–5.81)
RDW: 12.7 % (ref 11.5–15.5)
WBC: 13.3 10*3/uL — ABNORMAL HIGH (ref 4.0–10.5)
nRBC: 0 % (ref 0.0–0.2)

## 2023-01-21 LAB — GLUCOSE, CAPILLARY
Glucose-Capillary: 223 mg/dL — ABNORMAL HIGH (ref 70–99)
Glucose-Capillary: 200 mg/dL — ABNORMAL HIGH (ref 70–99)
Glucose-Capillary: 196 mg/dL — ABNORMAL HIGH (ref 70–99)
Glucose-Capillary: 164 mg/dL — ABNORMAL HIGH (ref 70–99)

## 2023-01-21 LAB — BASIC METABOLIC PANEL
Anion gap: 11 (ref 5–15)
BUN: 27 mg/dL — ABNORMAL HIGH (ref 6–20)
CO2: 23 mmol/L (ref 22–32)
Calcium: 8.3 mg/dL — ABNORMAL LOW (ref 8.9–10.3)
Chloride: 97 mmol/L — ABNORMAL LOW (ref 98–111)
Creatinine, Ser: 1.28 mg/dL — ABNORMAL HIGH (ref 0.61–1.24)
GFR, Estimated: >60 mL/min (ref >60)
Glucose, Bld: 205 mg/dL — ABNORMAL HIGH (ref 70–99)
Potassium: 4.2 mmol/L (ref 3.5–5.1)
Sodium: 131 mmol/L — ABNORMAL LOW (ref 135–145)

## 2023-01-21 MED ORDER — VANCOMYCIN HCL 1250 MG/250ML IV SOLN
1250.0000 mg | INTRAVENOUS | Status: DC
  Administered 2023-01-21 – 2023-01-22 (×2): 1250 mg via INTRAVENOUS
  Filled 2023-01-21 (×3): qty 250

## 2023-01-21 NOTE — Inpatient Diabetes Management (Signed)
Inpatient Diabetes Program Recommendations  AACE/ADA: New Consensus Statement on Inpatient Glycemic Control (2015)  Target Ranges:  Prepandial:   less than 140 mg/dL      Peak postprandial:   less than 180 mg/dL (1-2 hours)      Critically ill patients:  140 - 180 mg/dL   Lab Results  Component Value Date   GLUCAP 223 (H) 01/21/2023   HGBA1C 7.2 (H) 01/16/2023    Latest Reference Range & Units 01/20/23 06:11 01/20/23 11:11 01/20/23 16:45 01/20/23 21:05 01/21/23 06:02  Glucose-Capillary 70 - 99 mg/dL 219 (H) 261 (H) 174 (H) 161 (H) 223 (H)  (H): Data is abnormally high  Diabetes history: DM 2 Outpatient Diabetes medications: Basaglar 28 units, Humalog 8 units tid Current orders for Inpatient glycemic control:  Novolog 3 units tid meal coverage Novolog 0-20 units tid + hs 0-5 units  Inpatient Diabetes Program Recommendations:   Semglee 14 units qd (50% home basal dose)  Thank you, Bethena Roys E. Alexy Heldt, RN, MSN, CDE  Diabetes Coordinator Inpatient Glycemic Control Team Team Pager [phone] (8am-5pm) 01/21/2023 10:29 AM

## 2023-01-21 NOTE — Plan of Care (Signed)
  Problem: Health Behavior/Discharge Planning: Goal: Ability to manage health-related needs will improve Outcome: Not Progressing   

## 2023-01-21 NOTE — Progress Notes (Addendum)
Pharmacy Antibiotic Note  Sean Carter is a 52 y.o. male admitted on 01/15/2023 with diabetic foot wound infection s/p transmetatarsal amputation on 4/2. He has a wound vac place and will return to OR for wound washout on 4/5.  Pharmacy has been consulted for vancomycin and cefepime dosing. Scr has increased to 1.28 over the past 2 days.   Plan: Decrease Vancomycin to 1250 mg IV Q24H (eAUC 465.7, Scr 1.28) Continue cefepime 2g IV Q8H Monitor renal function, clinical progression and source control with surgical management  Height: 5\' 8"  (172.7 cm) Weight: 91.6 kg (202 lb) IBW/kg (Calculated) : 68.4  Temp (24hrs), Avg:98.4 F (36.9 C), Min:98 F (36.7 C), Max:98.7 F (37.1 C)  Recent Labs  Lab 01/15/23 1553 01/15/23 1936 01/16/23 0001 01/17/23 0129 01/18/23 0104 01/19/23 0443 01/20/23 0127 01/21/23 0114  WBC 13.1*  --    < > 14.7* 12.1* 11.9* 14.1* 13.3*  CREATININE 1.50*  --    < > 1.39* 1.21 0.99 1.13 1.28*  LATICACIDVEN 1.3 1.6  --   --   --   --   --   --   VANCORANDOM  --   --   --  6  --   --   --   --    < > = values in this interval not displayed.     Estimated Creatinine Clearance: 74.2 mL/min (A) (by C-G formula based on SCr of 1.28 mg/dL (H)).    Allergies  Allergen Reactions   Scallops [Shellfish Allergy] Anaphylaxis   Penicillins Other (See Comments)    Unknown reaction as a child   Dose adjustments: 4/2 Vancomycin 1250 Q24h > 1750 Q24h 4/4 Vancomycin 1750 Q24h > 1250 Q24h  Cultures: 3/29 Bcx X2: ngtd 4/2 wound culture: GNR, GPC  Thank you for allowing pharmacy to be a part of this patient's care. Titus Dubin, PharmD PGY1 Pharmacy Resident 01/21/2023 9:54 AM

## 2023-01-21 NOTE — Progress Notes (Signed)
     Daily Progress Note Intern Pager: [phone]  Patient name: Sean Carter Medical record number: [id number] Date of birth: 1971/10/19 Age: 52 y.o. Gender: male  Primary Care Provider: Gerrit Heck, MD Consultants: Vascular surgery Code Status: FULL   Pt Overview and Major Events to Date:  3/29-admitted 4/2-TMA   Assessment and Plan: Sean Carter is a 52 y.o. male PMH significant for DM2, HTN, and PAD presenting meeting sepsis criteria with worsening R diabetic foot ulcer.  Diabetic infection of right foot POD 2 TMA. Wound gram stain mod GNR and few GPC in pairs. Wound culture pending. Bcx remain no growth. -VVS following, appreciate recs, planned washout on Friday 4/5 -Continue vanc and cefepime (3/29- ) -Pain regimen: Tylenol 650mg  q6h sch, Oxycodone 5mg  q6h prn -Consult PT/OT when procedures finished  AKI (acute kidney injury) Slightly bumped again this morning at 1.28 after stopped IVF. Baseline Cr ~ 1.1 in 2023. -AM BMP -Continue good PO  Hypertension associated with diabetes BP mildly hypertensive. Acceptable control while in hospital. -Continue home Amlodipine 5 mg / HCTZ 12.5 mg -Consider adding back ARB if continues to rise  Diabetes mellitus type 2 with peripheral artery disease A1c 7.2. Home meds Basaglar 28U daily and Humalog 8U TID. CBG range remains 100s-200s but TID meal coverage not initiated until today. -Continue rSSI with QHS coverage -Continue 3u TID with meals -CBG checks  Restrictive lung disease Mild restrictive lung disease with response to albuterol on PFTs in 08/2022. Currently endorses persistent, non-productive cough. No wheezing noted. CXR negative for acute process. -Albuterol q6h prn -Continue home Montelukast -Tessalon perles prn -Continue claritin daily  FEN/GI: Carb modified diet PPx: Lovenox Dispo: Pending wound washout on Friday with VVS  Subjective:  Doing well this morning without complaints.  Objective: Temp:  [98  F (36.7 C)-98.7 F (37.1 C)] 98.7 F (37.1 C) (04/04 0358) Pulse Rate:  [67-76] 67 (04/04 0358) Resp:  [15-20] 20 (04/04 0358) BP: (114-144)/(58-76) 144/76 (04/04 0358) SpO2:  [95 %-98 %] 98 % (04/04 0358) Physical Exam: General: Alert and oriented, in NAD HEENT: NCAT, EOM grossly normal, midline nasal septum Cardiac: RRR, no m/r/g appreciated Respiratory: CTAB, breathing and speaking comfortably on RA Extremities: Moves all extremities grossly equally, LL extremity WWP with post-TMA changes and wound vac attached draining dark red blood Neurological: No gross focal deficit Psychiatric: Appropriate mood and affect   Laboratory: Most recent CBC Lab Results  Component Value Date   WBC 13.3 (H) 01/21/2023   HGB 8.5 (L) 01/21/2023   HCT 26.2 (L) 01/21/2023   MCV 87.6 01/21/2023   PLT 376 01/21/2023   Most recent BMP    Latest Ref Rng & Units 01/21/2023    1:14 AM  BMP  Glucose 70 - 99 mg/dL 205   BUN 6 - 20 mg/dL 27   Creatinine 0.61 - 1.24 mg/dL 1.28   Sodium 135 - 145 mmol/L 131   Potassium 3.5 - 5.1 mmol/L 4.2   Chloride 98 - 111 mmol/L 97   CO2 22 - 32 mmol/L 23   Calcium 8.9 - 10.3 mg/dL 8.3    Sean Hal, MD 01/21/2023, 8:31 AM PGY-1, [place] Intern pager: [phone], text pages welcome Secure chat group [facility] Teaching Service

## 2023-01-21 NOTE — Progress Notes (Addendum)
  Progress Note    01/21/2023 7:36 AM 2 Days Post-Op  Subjective:  no complaints   Vitals:   01/20/23 2346 01/21/23 0358  BP: (!) 120/58 (!) 144/76  Pulse: 70 67  Resp: 20 20  Temp: 98.3 F (36.8 C) 98.7 F (37.1 C)  SpO2: 97% 98%   Physical Exam Lungs:  non labored Incisions:  R foot vac with good seal Extremities: no significant erythema around incision Neurologic: A&O  CBC    Component Value Date/Time   WBC 13.3 (H) 01/21/2023 0114   RBC 2.99 (L) 01/21/2023 0114   HGB 8.5 (L) 01/21/2023 0114   HCT 26.2 (L) 01/21/2023 0114   PLT 376 01/21/2023 0114   MCV 87.6 01/21/2023 0114   MCH 28.4 01/21/2023 0114   MCHC 32.4 01/21/2023 0114   RDW 12.7 01/21/2023 0114   LYMPHSABS 1.1 01/15/2023 1553   MONOABS 1.1 (H) 01/15/2023 1553   EOSABS 0.0 01/15/2023 1553   BASOSABS 0.0 01/15/2023 1553    BMET    Component Value Date/Time   NA 131 (L) 01/21/2023 0114   NA 142 11/17/2021 1732   K 4.2 01/21/2023 0114   CL 97 (L) 01/21/2023 0114   CO2 23 01/21/2023 0114   GLUCOSE 205 (H) 01/21/2023 0114   BUN 27 (H) 01/21/2023 0114   BUN 37 (H) 11/17/2021 1732   CREATININE 1.28 (H) 01/21/2023 0114   CREATININE 0.78 07/10/2020 0910   CALCIUM 8.3 (L) 01/21/2023 0114   GFRNONAA >60 01/21/2023 0114   GFRNONAA 106 07/10/2020 0910   GFRAA 123 07/10/2020 0910    INR    Component Value Date/Time   INR 1.1 05/30/2020 1049     Intake/Output Summary (Last 24 hours) at 01/21/2023 0736 Last data filed at 01/21/2023 0700 Gross per 24 hour  Intake 1166.21 ml  Output 2050 ml  Net -883.79 ml     Assessment/Plan:  52 y.o. male is s/p open TMA 2 Days Post-Op   R foot vac with good seal; minimal peri-incisional erythema Continue IV antibiotics Plan is for return to OR tomorrow 4/5 for washout and possible closure NPO past midnight Consent ordered    Dagoberto Ligas, PA-C [facility] [phone] 01/21/2023 7:36 AM   I have independently interviewed and  examined patient and agree with PA assessment and plan above. Plan for tomorrow OR as above.   Ameah Chanda C. Donzetta Matters, MD [facility] [place] Office: [phone] Pager: [phone]

## 2023-01-22 ENCOUNTER — Other Ambulatory Visit: Payer: Self-pay

## 2023-01-22 ENCOUNTER — Inpatient Hospital Stay (HOSPITAL_COMMUNITY): Payer: Self-pay | Admitting: Certified Registered"

## 2023-01-22 ENCOUNTER — Encounter (HOSPITAL_COMMUNITY): Payer: Self-pay | Admitting: Family Medicine

## 2023-01-22 ENCOUNTER — Encounter (HOSPITAL_COMMUNITY)
Admission: EM | Disposition: A | Discharge status: Home / Self Care | Payer: Self-pay | Source: Home / Self Care | Attending: Family Medicine

## 2023-01-22 DIAGNOSIS — E1151 Type 2 diabetes mellitus with diabetic peripheral angiopathy without gangrene: Secondary | ICD-10-CM

## 2023-01-22 DIAGNOSIS — E08621 Diabetes mellitus due to underlying condition with foot ulcer: Secondary | ICD-10-CM

## 2023-01-22 DIAGNOSIS — Z794 Long term (current) use of insulin: Secondary | ICD-10-CM

## 2023-01-22 DIAGNOSIS — T8743 Infection of amputation stump, right lower extremity: Secondary | ICD-10-CM

## 2023-01-22 DIAGNOSIS — I10 Essential (primary) hypertension: Secondary | ICD-10-CM

## 2023-01-22 HISTORY — PX: APPLICATION OF WOUND VAC: SHX5189

## 2023-01-22 HISTORY — PX: INCISION AND DRAINAGE OF WOUND: SHX1803

## 2023-01-22 HISTORY — PX: STUMP REVISION: SHX6102

## 2023-01-22 LAB — CBC WITH DIFFERENTIAL/PLATELET
Abs Immature Granulocytes: 0.54 10*3/uL — ABNORMAL HIGH (ref 0.00–0.07)
Basophils Absolute: 0.1 10*3/uL (ref 0.0–0.1)
Basophils Relative: 1 %
Eosinophils Absolute: 0.3 10*3/uL (ref 0.0–0.5)
Eosinophils Relative: 2 %
HCT: 26.6 % — ABNORMAL LOW (ref 39.0–52.0)
Hemoglobin: 8.9 g/dL — ABNORMAL LOW (ref 13.0–17.0)
Immature Granulocytes: 4 %
Lymphocytes Relative: 17 %
Lymphs Abs: 2.3 10*3/uL (ref 0.7–4.0)
MCH: 29 pg (ref 26.0–34.0)
MCHC: 33.5 g/dL (ref 30.0–36.0)
MCV: 86.6 fL (ref 80.0–100.0)
Monocytes Absolute: 0.8 10*3/uL (ref 0.1–1.0)
Monocytes Relative: 6 %
Neutro Abs: 9.1 10*3/uL — ABNORMAL HIGH (ref 1.7–7.7)
Neutrophils Relative %: 70 %
Platelets: 412 10*3/uL — ABNORMAL HIGH (ref 150–400)
RBC: 3.07 MIL/uL — ABNORMAL LOW (ref 4.22–5.81)
RDW: 12.8 % (ref 11.5–15.5)
WBC: 13.1 10*3/uL — ABNORMAL HIGH (ref 4.0–10.5)
nRBC: 0 % (ref 0.0–0.2)

## 2023-01-22 LAB — GLUCOSE, CAPILLARY
Glucose-Capillary: 276 mg/dL — ABNORMAL HIGH (ref 70–99)
Glucose-Capillary: 283 mg/dL — ABNORMAL HIGH (ref 70–99)
Glucose-Capillary: 217 mg/dL — ABNORMAL HIGH (ref 70–99)
Glucose-Capillary: 300 mg/dL — ABNORMAL HIGH (ref 70–99)
Glucose-Capillary: 448 mg/dL — ABNORMAL HIGH (ref 70–99)
Glucose-Capillary: 454 mg/dL — ABNORMAL HIGH (ref 70–99)

## 2023-01-22 LAB — BASIC METABOLIC PANEL
Anion gap: 10 (ref 5–15)
BUN: 25 mg/dL — ABNORMAL HIGH (ref 6–20)
CO2: 23 mmol/L (ref 22–32)
Calcium: 8.4 mg/dL — ABNORMAL LOW (ref 8.9–10.3)
Chloride: 97 mmol/L — ABNORMAL LOW (ref 98–111)
Creatinine, Ser: 1.11 mg/dL (ref 0.61–1.24)
GFR, Estimated: >60 mL/min (ref >60)
Glucose, Bld: 361 mg/dL — ABNORMAL HIGH (ref 70–99)
Potassium: 4.1 mmol/L (ref 3.5–5.1)
Sodium: 130 mmol/L — ABNORMAL LOW (ref 135–145)

## 2023-01-22 SURGERY — IRRIGATION AND DEBRIDEMENT WOUND
Anesthesia: General | Site: Foot | Laterality: Right

## 2023-01-22 MED ORDER — KETOROLAC TROMETHAMINE 30 MG/ML IJ SOLN: 30.0000 mg | Freq: Once | INTRAMUSCULAR | Status: DC | PRN

## 2023-01-22 MED ORDER — LIDOCAINE 2% (20 MG/ML) 5 ML SYRINGE
INTRAMUSCULAR | Status: AC
  Filled 2023-01-22: qty 5

## 2023-01-22 MED ORDER — ONDANSETRON HCL 4 MG/2ML IJ SOLN
INTRAMUSCULAR | Status: AC
  Filled 2023-01-22: qty 2

## 2023-01-22 MED ORDER — AMISULPRIDE (ANTIEMETIC) 5 MG/2ML IV SOLN: 10.0000 mg | Freq: Once | INTRAVENOUS | Status: DC | PRN

## 2023-01-22 MED ORDER — 0.9 % SODIUM CHLORIDE (POUR BTL) OPTIME
TOPICAL | Status: DC | PRN
  Administered 2023-01-22: 1000 mL

## 2023-01-22 MED ORDER — OXYCODONE HCL 5 MG/5ML PO SOLN: 5.0000 mg | Freq: Once | ORAL | Status: DC | PRN

## 2023-01-22 MED ORDER — EPHEDRINE 5 MG/ML INJ
INTRAVENOUS | Status: AC
  Filled 2023-01-22: qty 5

## 2023-01-22 MED ORDER — MIDAZOLAM HCL 2 MG/2ML IJ SOLN
INTRAMUSCULAR | Status: DC | PRN
  Administered 2023-01-22: 2 mg via INTRAVENOUS

## 2023-01-22 MED ORDER — LIDOCAINE 2% (20 MG/ML) 5 ML SYRINGE
INTRAMUSCULAR | Status: DC | PRN
  Administered 2023-01-22: 60 mg via INTRAVENOUS

## 2023-01-22 MED ORDER — LACTATED RINGERS IV SOLN: INTRAVENOUS | Status: DC

## 2023-01-22 MED ORDER — OXYCODONE HCL 5 MG PO TABS: 5.0000 mg | ORAL_TABLET | Freq: Once | ORAL | Status: DC | PRN

## 2023-01-22 MED ORDER — INSULIN ASPART 100 UNIT/ML IJ SOLN
5.0000 [IU] | Freq: Once | INTRAMUSCULAR | Status: AC
  Administered 2023-01-22: 5 [IU] via SUBCUTANEOUS

## 2023-01-22 MED ORDER — DEXAMETHASONE SODIUM PHOSPHATE 10 MG/ML IJ SOLN
INTRAMUSCULAR | Status: AC
  Filled 2023-01-22: qty 1

## 2023-01-22 MED ORDER — ROCURONIUM BROMIDE 10 MG/ML (PF) SYRINGE
PREFILLED_SYRINGE | INTRAVENOUS | Status: AC
  Filled 2023-01-22: qty 10

## 2023-01-22 MED ORDER — POLYETHYLENE GLYCOL 3350 17 G PO PACK
17.0000 g | PACK | Freq: Every day | ORAL | Status: DC
  Filled 2023-01-22 (×3): qty 1

## 2023-01-22 MED ORDER — FENTANYL CITRATE (PF) 250 MCG/5ML IJ SOLN
INTRAMUSCULAR | Status: AC
  Filled 2023-01-22: qty 5

## 2023-01-22 MED ORDER — POLYETHYLENE GLYCOL 3350 17 G PO PACK: 17.0000 g | PACK | Freq: Every day | ORAL | Status: DC | PRN

## 2023-01-22 MED ORDER — EPHEDRINE SULFATE-NACL 50-0.9 MG/10ML-% IV SOSY
PREFILLED_SYRINGE | INTRAVENOUS | Status: DC | PRN
  Administered 2023-01-22 (×2): 10 mg via INTRAVENOUS
  Administered 2023-01-22: 15 mg via INTRAVENOUS

## 2023-01-22 MED ORDER — INSULIN ASPART 100 UNIT/ML IJ SOLN
INTRAMUSCULAR | Status: AC
  Administered 2023-01-22: 8 [IU] via SUBCUTANEOUS
  Filled 2023-01-22: qty 1

## 2023-01-22 MED ORDER — MIDAZOLAM HCL 2 MG/2ML IJ SOLN
INTRAMUSCULAR | Status: AC
  Filled 2023-01-22: qty 2

## 2023-01-22 MED ORDER — INSULIN ASPART 100 UNIT/ML IJ SOLN: 0.0000 [IU] | INTRAMUSCULAR | Status: DC | PRN

## 2023-01-22 MED ORDER — PROMETHAZINE HCL 25 MG/ML IJ SOLN: 6.2500 mg | INTRAMUSCULAR | Status: DC | PRN

## 2023-01-22 MED ORDER — ORAL CARE MOUTH RINSE: 15.0000 mL | Freq: Once | OROMUCOSAL | Status: AC

## 2023-01-22 MED ORDER — ONDANSETRON HCL 4 MG/2ML IJ SOLN
INTRAMUSCULAR | Status: DC | PRN
  Administered 2023-01-22: 4 mg via INTRAVENOUS

## 2023-01-22 MED ORDER — ACETAMINOPHEN 10 MG/ML IV SOLN: 1000.0000 mg | Freq: Once | INTRAVENOUS | Status: DC | PRN

## 2023-01-22 MED ORDER — PROPOFOL 10 MG/ML IV BOLUS
INTRAVENOUS | Status: DC | PRN
  Administered 2023-01-22: 200 mg via INTRAVENOUS

## 2023-01-22 MED ORDER — INSULIN GLARGINE-YFGN 100 UNIT/ML ~~LOC~~ SOLN
14.0000 [IU] | Freq: Every day | SUBCUTANEOUS | Status: DC
  Administered 2023-01-22: 14 [IU] via SUBCUTANEOUS
  Filled 2023-01-22 (×2): qty 0.14

## 2023-01-22 MED ORDER — PROPOFOL 10 MG/ML IV BOLUS
INTRAVENOUS | Status: AC
  Filled 2023-01-22: qty 20

## 2023-01-22 MED ORDER — CHLORHEXIDINE GLUCONATE 0.12 % MT SOLN
15.0000 mL | Freq: Once | OROMUCOSAL | Status: AC
  Administered 2023-01-22: 15 mL via OROMUCOSAL
  Filled 2023-01-22: qty 15

## 2023-01-22 MED ORDER — DEXAMETHASONE SODIUM PHOSPHATE 10 MG/ML IJ SOLN
INTRAMUSCULAR | Status: DC | PRN
  Administered 2023-01-22: 5 mg via INTRAVENOUS

## 2023-01-22 MED ORDER — FENTANYL CITRATE (PF) 100 MCG/2ML IJ SOLN: 25.0000 ug | INTRAMUSCULAR | Status: DC | PRN

## 2023-01-22 MED ORDER — SUCCINYLCHOLINE CHLORIDE 200 MG/10ML IV SOSY
PREFILLED_SYRINGE | INTRAVENOUS | Status: AC
  Filled 2023-01-22: qty 10

## 2023-01-22 MED ORDER — FENTANYL CITRATE (PF) 250 MCG/5ML IJ SOLN
INTRAMUSCULAR | Status: DC | PRN
  Administered 2023-01-22 (×2): 25 ug via INTRAVENOUS

## 2023-01-22 SURGICAL SUPPLY — 27 items
BAG COUNTER SPONGE SURGICOUNT (BAG) ×2 IMPLANT
BAG SPNG CNTER NS LX DISP (BAG) ×1
BNDG ELASTIC 4X5.8 VLCR STR LF (GAUZE/BANDAGES/DRESSINGS) IMPLANT
BNDG GAUZE DERMACEA FLUFF 4 (GAUZE/BANDAGES/DRESSINGS) IMPLANT
BNDG GZE DERMACEA 4 6PLY (GAUZE/BANDAGES/DRESSINGS)
CANISTER SUCT 3000ML PPV (MISCELLANEOUS) ×2 IMPLANT
CANISTER WOUND CARE 500ML ATS (WOUND CARE) IMPLANT
COVER SURGICAL LIGHT HANDLE (MISCELLANEOUS) ×2 IMPLANT
DRAPE DERMATAC (DRAPES) IMPLANT
DRSG VAC GRANUFOAM SM (GAUZE/BANDAGES/DRESSINGS) IMPLANT
ELECT REM PT RETURN 9FT ADLT (ELECTROSURGICAL) ×1
ELECTRODE REM PT RTRN 9FT ADLT (ELECTROSURGICAL) ×2 IMPLANT
GAUZE SPONGE 4X4 12PLY STRL (GAUZE/BANDAGES/DRESSINGS) ×2 IMPLANT
GLOVE BIO SURGEON STRL SZ7.5 (GLOVE) ×2 IMPLANT
GOWN STRL REUS W/ TWL LRG LVL3 (GOWN DISPOSABLE) ×4 IMPLANT
GOWN STRL REUS W/ TWL XL LVL3 (GOWN DISPOSABLE) ×2 IMPLANT
GOWN STRL REUS W/TWL LRG LVL3 (GOWN DISPOSABLE) ×2
GOWN STRL REUS W/TWL XL LVL3 (GOWN DISPOSABLE) ×1
IV NS IRRIG 3000ML ARTHROMATIC (IV SOLUTION) ×2 IMPLANT
KIT BASIN OR (CUSTOM PROCEDURE TRAY) ×2 IMPLANT
KIT TURNOVER KIT B (KITS) ×2 IMPLANT
NS IRRIG 1000ML POUR BTL (IV SOLUTION) ×2 IMPLANT
PACK GENERAL/GYN (CUSTOM PROCEDURE TRAY) ×2 IMPLANT
PAD ARMBOARD 7.5X6 YLW CONV (MISCELLANEOUS) ×4 IMPLANT
SUT VIC AB 2-0 CT1 18 (SUTURE) IMPLANT
TOWEL GREEN STERILE (TOWEL DISPOSABLE) ×2 IMPLANT
WATER STERILE IRR 1000ML POUR (IV SOLUTION) ×2 IMPLANT

## 2023-01-22 NOTE — Plan of Care (Signed)
  Problem: Nutritional: Goal: Maintenance of adequate nutrition will improve Outcome: Progressing   Problem: Education: Goal: Knowledge of General Education information will improve Description: Including pain rating scale, medication(s)/side effects and non-pharmacologic comfort measures Outcome: Progressing   Problem: Activity: Goal: Risk for activity intolerance will decrease Outcome: Progressing   Problem: Coping: Goal: Level of anxiety will decrease Outcome: Progressing

## 2023-01-22 NOTE — Progress Notes (Addendum)
  Progress Note    01/22/2023 11:31 AM Day of Surgery  Subjective: No overnight issues  Vitals:   01/22/23 0805 01/22/23 1049  BP: (!) 146/76 (!) 153/73  Pulse: 73 66  Resp: 17 18  Temp: 98.4 F (36.9 C) 98.8 F (37.1 C)  SpO2: 98% 96%    Physical Exam: Awake alert oriented Nonlabored respirations Right foot vacuum seal with palpable dorsalis pedis pulse and no evidence of cellulitis  CBC    Component Value Date/Time   WBC 13.1 (H) 01/22/2023 0055   RBC 3.07 (L) 01/22/2023 0055   HGB 8.9 (L) 01/22/2023 0055   HCT 26.6 (L) 01/22/2023 0055   PLT 412 (H) 01/22/2023 0055   MCV 86.6 01/22/2023 0055   MCH 29.0 01/22/2023 0055   MCHC 33.5 01/22/2023 0055   RDW 12.8 01/22/2023 0055   LYMPHSABS 2.3 01/22/2023 0055   MONOABS 0.8 01/22/2023 0055   EOSABS 0.3 01/22/2023 0055   BASOSABS 0.1 01/22/2023 0055    BMET    Component Value Date/Time   NA 130 (L) 01/22/2023 0055   NA 142 11/17/2021 1732   K 4.1 01/22/2023 0055   CL 97 (L) 01/22/2023 0055   CO2 23 01/22/2023 0055   GLUCOSE 361 (H) 01/22/2023 0055   BUN 25 (H) 01/22/2023 0055   BUN 37 (H) 11/17/2021 1732   CREATININE 1.11 01/22/2023 0055   CREATININE 0.78 07/10/2020 0910   CALCIUM 8.4 (L) 01/22/2023 0055   GFRNONAA >60 01/22/2023 0055   GFRNONAA 106 07/10/2020 0910   GFRAA 123 07/10/2020 0910    INR    Component Value Date/Time   INR 1.1 05/30/2020 1049     Intake/Output Summary (Last 24 hours) at 01/22/2023 1131 Last data filed at 01/22/2023 0800 Gross per 24 hour  Intake 1273.79 ml  Output 1625 ml  Net -351.21 ml     Assessment:  52 y.o. male is s/p open right transmetatarsal amputation  Plan: OR today for possible closure possible partial closure of right transmetatarsal amputation.   Mace Weinberg C. Randie Heinz, MD [facility] [place] Office: [phone] Pager: [phone]  01/22/2023 11:31 AM

## 2023-01-22 NOTE — Transfer of Care (Signed)
Immediate Anesthesia Transfer of Care Note  Patient: Sean Carter  Procedure(s) Performed: WASHOUT OF RIGHT FOOT WOUND (Right: Foot) PARTIAL CLOSURE OF RIGHT FOOT WOUND (Right: Foot) APPLICATION OF WOUND VAC, RIGHT FOOT (Right: Foot)  Patient Location: PACU  Anesthesia Type:General  Level of Consciousness: awake, alert , and oriented  Airway & Oxygen Therapy: Patient Spontanous Breathing  Post-op Assessment: Report given to RN and Post -op Vital signs reviewed and stable  Post vital signs: Reviewed and stable  Last Vitals:  Vitals Value Taken Time  BP 152/73 01/22/23 1301  Temp 36.7 C 01/22/23 1301  Pulse 93 01/22/23 1307  Resp 27 01/22/23 1307  SpO2 90 % 01/22/23 1307  Vitals shown include unvalidated device data.  Last Pain:  Vitals:   01/22/23 1122  TempSrc:   PainSc: 0-No pain      Patients Stated Pain Goal: 2 (01/22/23 1122)  Complications: No notable events documented.

## 2023-01-22 NOTE — Anesthesia Preprocedure Evaluation (Addendum)
Anesthesia Evaluation  Patient identified by MRN, date of birth, ID band Patient awake    Reviewed: Allergy & Precautions, NPO status , Patient's Chart, lab work & pertinent test results  Airway Mallampati: III  TM Distance: >3 FB Neck ROM: Full    Dental no notable dental hx.    Pulmonary neg pulmonary ROS   Pulmonary exam normal        Cardiovascular hypertension, Pt. on medications + Peripheral Vascular Disease  Normal cardiovascular exam     Neuro/Psych negative neurological ROS  negative psych ROS   GI/Hepatic negative GI ROS, Neg liver ROS,,,  Endo/Other  diabetes, Insulin Dependent  hyponatremia  Renal/GU Renal disease bladder dysfunction      Musculoskeletal negative musculoskeletal ROS (+)    Abdominal   Peds  Hematology  (+) Blood dyscrasia, anemia   Anesthesia Other Findings Open right transmetatarsal amputation  Reproductive/Obstetrics                             Anesthesia Physical Anesthesia Plan  ASA: 3  Anesthesia Plan: General   Post-op Pain Management:    Induction: Intravenous  PONV Risk Score and Plan: 2 and Ondansetron, Dexamethasone, Midazolam and Treatment may vary due to age or medical condition  Airway Management Planned: LMA  Additional Equipment:   Intra-op Plan:   Post-operative Plan: Extubation in OR  Informed Consent: I have reviewed the patients History and Physical, chart, labs and discussed the procedure including the risks, benefits and alternatives for the proposed anesthesia with the patient or authorized representative who has indicated his/her understanding and acceptance.     Dental advisory given  Plan Discussed with: CRNA  Anesthesia Plan Comments:        Anesthesia Quick Evaluation

## 2023-01-22 NOTE — Op Note (Signed)
    Patient name: BECKET CIANCIULLI MRN: [id number] DOB: 08-Jul-1971 Sex: male  01/22/2023 Pre-operative Diagnosis: Open right transmetatarsal amputation Post-operative diagnosis:  Same Surgeon:  Apolinar Junes C. Randie Heinz, MD Assistant: Keri Swazil[place], RNFA Procedure Performed:  Washout and partial closure right transmetatarsal amputation with open incisional wound VAC placement  Indications: 52 year old male with history of previous toe amputations and was recently treated for infected diabetic foot ulceration with open transmetatarsal amputation.  He has been maintained on antibiotics and the infection appears to have cleared with improving leukocytosis and he is indicated for washout with possible closure of the transmetatarsal amputation site.  Findings: All of the wound appeared healthy.  I was able to close the tissue over the previously transected metatarsal bones but the skin was under too much tension for closure and given the recent infection with cellulitis I elected to leave the incision open and place wound VAC.   Procedure:  The patient was identified in the holding area and taken to the operating room where LMA anesthesia was induced.  He was sterilely prepped and draped in the right foot in usual fashion, antibiotics were minister timeout was called.  We began with washing out the wound all the wound appeared satisfactory we obtained hemostasis with cautery.  I was able to close tissue over the metatarsal bones with interrupted 2-0 Vicryl suture.  A wound VAC was then fashioned in the open skin and placed.  Patient tolerated the procedure without immediate complication.  All counts were correct at completion.  EBL: 10 cc    Jayson Waterhouse C. Randie Heinz, MD [facility] [place] Office: [phone] Pager: [phone]

## 2023-01-22 NOTE — Progress Notes (Signed)
  Transition of Care [facility]) Screening Note   Patient Details  Name: Sean Carter Date of Birth: 1971-04-21   Transition of Care [facility]) CM/SW Contact:    Darrold Span, RN Phone Number: 01/22/2023, 3:31 PM    Transition of Care Department [facility]) has reviewed patient and note referral for home wound VAC. Pt had partial wound closure in OR today with wound VAC re-placed for ongoing wound needs. Patient is un-insured and home VAC would need to be arranged under [employer] charity program- however HH would be a barrier to secure under charity for [facility] dressing needs in the home.  TOC to follow up first of next week regarding plan for wound or any further attempts for closure.   We will continue to monitor patient advancement through interdisciplinary progression rounds. If new patient transition needs arise, please place a TOC consult.

## 2023-01-22 NOTE — Progress Notes (Signed)
Pt's repeat blood sugar was 454 orders received from Dr. Claudean Severance to give additional 5 units of novolog insulin

## 2023-01-22 NOTE — Plan of Care (Signed)
  Problem: Nutritional: Goal: Maintenance of adequate nutrition will improve Outcome: Progressing   

## 2023-01-22 NOTE — Anesthesia Procedure Notes (Signed)
Procedure Name: LMA Insertion Date/Time: 01/22/2023 12:11 PM  Performed by: Stanton Kidney, CRNAPre-anesthesia Checklist: Patient identified, Patient being monitored, Timeout performed, Emergency Drugs available and Suction available Patient Re-evaluated:Patient Re-evaluated prior to induction Oxygen Delivery Method: Circle system utilized Preoxygenation: Pre-oxygenation with 100% oxygen Induction Type: IV induction Ventilation: Mask ventilation without difficulty LMA: LMA inserted LMA Size: 5.0 Tube type: Oral Number of attempts: 1 Placement Confirmation: positive ETCO2 and breath sounds checked- equal and bilateral Tube secured with: Tape Dental Injury: Teeth and Oropharynx as per pre-operative assessment

## 2023-01-22 NOTE — Plan of Care (Signed)
  Problem: Nutritional: Goal: Maintenance of adequate nutrition will improve 01/22/2023 0147 by Jill Side, RN Outcome: Progressing 01/22/2023 0145 by Jill Side, RN Outcome: Progressing

## 2023-01-22 NOTE — Anesthesia Postprocedure Evaluation (Signed)
Anesthesia Post Note  Patient: Sean Carter  Procedure(s) Performed: WASHOUT OF RIGHT FOOT WOUND (Right: Foot) PARTIAL CLOSURE OF RIGHT FOOT WOUND (Right: Foot) APPLICATION OF WOUND VAC, RIGHT FOOT (Right: Foot)     Patient location during evaluation: PACU Anesthesia Type: General Level of consciousness: awake Pain management: pain level controlled Vital Signs Assessment: post-procedure vital signs reviewed and stable Respiratory status: spontaneous breathing, nonlabored ventilation and respiratory function stable Cardiovascular status: blood pressure returned to baseline and stable Postop Assessment: no apparent nausea or vomiting Anesthetic complications: no   No notable events documented.  Last Vitals:  Vitals:   01/22/23 1330 01/22/23 1547  BP: (!) 163/78 (!) 144/83  Pulse: 89 78  Resp: 12 18  Temp: 36.8 C 36.9 C  SpO2: 98% 96%    Last Pain:  Vitals:   01/22/23 1547  TempSrc: Oral  PainSc:                  Lycia Sachdeva P Jamisen Hawes

## 2023-01-22 NOTE — Progress Notes (Signed)
Pt blood sugar 448 MD called new orders received will recheck in 1 hour

## 2023-01-22 NOTE — Progress Notes (Signed)
     Daily Progress Note Intern Pager: [phone]  Patient name: Sean Carter Medical record number: [id number] Date of birth: May 09, 1971 Age: 52 y.o. Gender: male  Primary Care Provider: Levin Erp, MD Consultants: Vascular surgery Code Status: FULL   Pt Overview and Major Events to Date:  3/29-admitted 4/2-TMA 4/5-wound washout   Assessment and Plan: Sean Carter is a 52 y.o. male PMH significant for DM2, HTN, and PAD presenting meeting sepsis criteria with worsening R diabetic foot ulcer, now s/p TMA.  Diabetic infection of right foot POD 3 TMA. Wound gram stain mod GNR and few GPC in pairs. Wound culture pending. Bcx no growth. -VVS following, appreciate recs, planned washout today -Continue vanc and cefepime (3/29- ) -Pain regimen: Tylenol 650mg  q6h sch, Oxycodone 5mg  q6h prn -Consult PT/OT when procedures finished  AKI (acute kidney injury) Cr at baseline, at 1.11. -AM BMP -Continue good PO  Hypertension associated with diabetes BP mildly hypertensive. Acceptable control while in hospital. -Continue home Amlodipine 5 mg / HCTZ 12.5 mg -Consider adding back ARB if continues to rise  Diabetes mellitus type 2 with peripheral artery disease A1c 7.2. Home meds Basaglar 28U daily and Humalog 8U TID. CBG range remains 100s-200s with one value 361 on 3u TID meal coverage. -Continue rSSI with QHS coverage as well as meal coverage -Add back 1/2 LAI after surgery -CBG checks  Restrictive lung disease Mild restrictive lung disease with response to albuterol on PFTs in 08/2022. Continues to have mild cough. No wheezing noted. CXR negative for acute process. -Albuterol q6h prn -Continue home Montelukast -Tessalon perles prn -Continue claritin daily  FEN/GI: Carb modified diet though NPO ON for washout today PPx: Lovenox Dispo: Pending wound washout today  Subjective:  Doing well this morning without complaints.  He is ready for wound washout  today.  Objective: Temp:  [97.9 F (36.6 C)-98.5 F (36.9 C)] 97.9 F (36.6 C) (04/05 0629) Pulse Rate:  [67-76] 74 (04/05 0629) Resp:  [16-19] 19 (04/05 0629) BP: (117-156)/(64-81) 154/75 (04/05 0629) SpO2:  [97 %-100 %] 99 % (04/05 0629) Physical Exam: General: Alert and oriented, in NAD HEENT: NCAT, EOM grossly normal, midline nasal septum Cardiac: RRR, no m/r/g appreciated Respiratory: CTAB, breathing and speaking comfortably on RA Extremities: Moves all extremities grossly equally, LL extremity WWP with post-TMA changes and wound vac attached draining dark red blood Neurological: No gross focal deficit Psychiatric: Appropriate mood and affect   Laboratory: Most recent CBC Lab Results  Component Value Date   WBC 13.1 (H) 01/22/2023   HGB 8.9 (L) 01/22/2023   HCT 26.6 (L) 01/22/2023   MCV 86.6 01/22/2023   PLT 412 (H) 01/22/2023   Most recent BMP    Latest Ref Rng & Units 01/22/2023   12:55 AM  BMP  Glucose 70 - 99 mg/dL 353   BUN 6 - 20 mg/dL 25   Creatinine 6.14 - 1.24 mg/dL 4.31   Sodium 540 - 086 mmol/L 130   Potassium 3.5 - 5.1 mmol/L 4.1   Chloride 98 - 111 mmol/L 97   CO2 22 - 32 mmol/L 23   Calcium 8.9 - 10.3 mg/dL 8.4    Janeal Holmes, MD 01/22/2023, 7:30 AM PGY-1, [facility] FPTS Intern pager: [phone], text pages welcome Secure chat group [facility] Teaching Service

## 2023-01-23 ENCOUNTER — Encounter (HOSPITAL_COMMUNITY): Payer: Self-pay | Admitting: Vascular Surgery

## 2023-01-23 LAB — AEROBIC/ANAEROBIC CULTURE W GRAM STAIN (SURGICAL/DEEP WOUND): Gram Stain: NONE SEEN

## 2023-01-23 LAB — BASIC METABOLIC PANEL
Anion gap: 9 (ref 5–15)
BUN: 24 mg/dL — ABNORMAL HIGH (ref 6–20)
CO2: 23 mmol/L (ref 22–32)
Calcium: 8.7 mg/dL — ABNORMAL LOW (ref 8.9–10.3)
Chloride: 97 mmol/L — ABNORMAL LOW (ref 98–111)
Creatinine, Ser: 1.14 mg/dL (ref 0.61–1.24)
GFR, Estimated: >60 mL/min (ref >60)
Glucose, Bld: 408 mg/dL — ABNORMAL HIGH (ref 70–99)
Potassium: 4.7 mmol/L (ref 3.5–5.1)
Sodium: 129 mmol/L — ABNORMAL LOW (ref 135–145)

## 2023-01-23 LAB — GLUCOSE, CAPILLARY
Glucose-Capillary: 222 mg/dL — ABNORMAL HIGH (ref 70–99)
Glucose-Capillary: 241 mg/dL — ABNORMAL HIGH (ref 70–99)
Glucose-Capillary: 270 mg/dL — ABNORMAL HIGH (ref 70–99)
Glucose-Capillary: 289 mg/dL — ABNORMAL HIGH (ref 70–99)
Glucose-Capillary: 351 mg/dL — ABNORMAL HIGH (ref 70–99)

## 2023-01-23 LAB — CBC
HCT: 27.4 % — ABNORMAL LOW (ref 39.0–52.0)
Hemoglobin: 9.3 g/dL — ABNORMAL LOW (ref 13.0–17.0)
MCH: 29.2 pg (ref 26.0–34.0)
MCHC: 33.9 g/dL (ref 30.0–36.0)
MCV: 85.9 fL (ref 80.0–100.0)
Platelets: 468 10*3/uL — ABNORMAL HIGH (ref 150–400)
RBC: 3.19 MIL/uL — ABNORMAL LOW (ref 4.22–5.81)
RDW: 12.6 % (ref 11.5–15.5)
WBC: 20 10*3/uL — ABNORMAL HIGH (ref 4.0–10.5)
nRBC: 0 % (ref 0.0–0.2)

## 2023-01-23 MED ORDER — INSULIN GLARGINE-YFGN 100 UNIT/ML ~~LOC~~ SOLN
28.0000 [IU] | Freq: Every day | SUBCUTANEOUS | Status: DC
  Administered 2023-01-23 – 2023-01-25 (×3): 28 [IU] via SUBCUTANEOUS
  Filled 2023-01-23 (×4): qty 0.28

## 2023-01-23 MED ORDER — SODIUM CHLORIDE 0.9 % IV SOLN
3.0000 g | Freq: Four times a day (QID) | INTRAVENOUS | Status: DC
  Administered 2023-01-23 – 2023-01-25 (×9): 3 g via INTRAVENOUS
  Filled 2023-01-23 (×9): qty 8

## 2023-01-23 MED ORDER — SODIUM CHLORIDE 0.9 % IV SOLN: 3.0000 g | Freq: Four times a day (QID) | INTRAVENOUS | Status: DC

## 2023-01-23 NOTE — Progress Notes (Signed)
Daily Progress Note Intern Pager: [phone]  Patient name: Sean Carter Medical record number: [id number] Date of birth: 1970-12-23 Age: 52 y.o. Gender: male  Primary Care Provider: Levin Erp, MD Consultants: Vascular surgery Code Status: FULL   Pt Overview and Major Events to Date:  3/29-admitted 4/2-TMA 4/5-wound washout without closure   Assessment and Plan: Sean Carter is a 52 y.o. male PMH significant for DM2, HTN, and PAD presenting meeting sepsis criteria with worsening R diabetic foot ulcer, now s/p TMA.  Diabetic infection of right foot POD 4 TMA. Wound gram stain mod GNR and few GPC in pairs. Wound culture with rare GBS (positive B lactamase), rare prevotella bivia, few fusobacterium nucleatum, rare eikenella corrodens. Bcx no growth. -VVS following, appreciate recs, planned washout today -De-escalate to cefepime only (3/29- ) and stop vanc (3/29-4/6) given wound speciation -Pain regimen: Tylenol 650mg  q6h sch, Oxycodone 5mg  q6h prn -Consult PT/OT when wound closed  AKI (acute kidney injury) Cr at baseline around 1.1. -AM BMP -Continue good PO  Hypertension associated with diabetes BP mildly hypertensive. Acceptable control while in hospital. -Continue home Amlodipine 5 mg / HCTZ 12.5 mg -Consider adding back ARB if continues to rise  Diabetes mellitus type 2 with peripheral artery disease A1c 7.2. Home meds Basaglar 28U daily and Humalog 8U TID. CBG range remains 100s-200s with one value 361 on 3u TID meal coverage. -Continue rSSI with QHS coverage as well as meal coverage -Restart home Semglee dose at 28u -CBG checks  Restrictive lung disease Mild restrictive lung disease with response to albuterol on PFTs in 08/2022. Continues to have mild cough. No wheezing noted. CXR negative for acute process. -Albuterol q6h prn -Continue home Montelukast -Tessalon perles prn -Continue claritin daily  FEN/GI: Carb modified diet PPx: Lovenox Dispo:  Pending VVS recs with wound closure  Subjective:  Doing well this morning. Did not sleep well overnight though because his blood sugar ran high, and this worried him.  Objective: Temp:  [98 F (36.7 C)-98.8 F (37.1 C)] 98 F (36.7 C) (04/06 0729) Pulse Rate:  [66-97] 73 (04/06 0729) Resp:  [12-20] 14 (04/06 0729) BP: (109-168)/(66-108) 139/66 (04/06 0729) SpO2:  [93 %-100 %] 100 % (04/06 0729) Weight:  [91.6 kg] 91.6 kg (04/05 1049) Physical Exam: General: Alert and oriented, in NAD Skin: Warm, dry HEENT: NCAT, EOM grossly normal, midline nasal septum Cardiac: Regular rate Respiratory: Breathing and speaking comfortably on RA Abdominal: Soft, nontender, nondistended Extremities: Moves all extremities grossly equally; RLE with bandage and wound vac overlying Neurological: No gross focal deficit Psychiatric: Appropriate mood and affect   Laboratory: Most recent CBC Lab Results  Component Value Date   WBC 20.0 (H) 01/23/2023   HGB 9.3 (L) 01/23/2023   HCT 27.4 (L) 01/23/2023   MCV 85.9 01/23/2023   PLT 468 (H) 01/23/2023   Most recent BMP    Latest Ref Rng & Units 01/23/2023   12:58 AM  BMP  Glucose 70 - 99 mg/dL 102   BUN 6 - 20 mg/dL 24   Creatinine 1.11 - 1.24 mg/dL 7.35   Sodium 670 - 141 mmol/L 129   Potassium 3.5 - 5.1 mmol/L 4.7   Chloride 98 - 111 mmol/L 97   CO2 22 - 32 mmol/L 23   Calcium 8.9 - 10.3 mg/dL 8.7    CBG 030>131>438>887  Sean Holmes, MD 01/23/2023, 8:16 AM PGY-1,  Family Medicine FPTS Intern pager: [phone], text pages welcome Secure chat group [facility] Teaching Service

## 2023-01-23 NOTE — Progress Notes (Signed)
  VASCULAR SURGERY ASSESSMENT & PLAN:   S/P OPEN RIGHT TRANSMETATARSAL AMPUTATION: His VAC has a good seal.  There was an occlusion so we changed the Lilly pad and that seems to have corrected the problem.  He is for [facility] change on Monday.  PERIPHERAL ARTERIAL DISEASE: His arteriogram showed inline flow through the anterior tibial artery on the right.  The posterior tibial artery was occluded but could not be recannulated.  Thus his circulation has been optimized on the right.  DVT PROPHYLAXIS: He is on subcu heparin.  SUBJECTIVE:   No complaints this morning.  PHYSICAL EXAM:   Vitals:   01/22/23 1547 01/22/23 1937 01/23/23 0310 01/23/23 0729  BP: (!) 144/83 109/71 (!) 160/83 139/66  Pulse: 78 89 76 73  Resp: 18 20 20 14   Temp: 98.4 F (36.9 C) 98.3 F (36.8 C) 98.5 F (36.9 C) 98 F (36.7 C)  TempSrc: Oral Oral Oral Oral  SpO2: 96% 99% 100% 100%  Weight:      Height:       As noted above we fixed this back. He has a brisk anterior tibial signal with the Doppler and a monophasic posterior tibial signal.  LABS:   Lab Results  Component Value Date   WBC 20.0 (H) 01/23/2023   HGB 9.3 (L) 01/23/2023   HCT 27.4 (L) 01/23/2023   MCV 85.9 01/23/2023   PLT 468 (H) 01/23/2023   Lab Results  Component Value Date   CREATININE 1.14 01/23/2023   Lab Results  Component Value Date   INR 1.1 05/30/2020   CBG (last 3)  Recent Labs    01/22/23 2222 01/22/23 2358 01/23/23 0550  GLUCAP 454* 351* 270*    PROBLEM LIST:    Active Problems:   Sepsis   Hypertension associated with diabetes   Diabetes mellitus type 2 with peripheral artery disease   Restrictive lung disease   AKI (acute kidney injury)   Diabetic infection of right foot   Cellulitis of right foot   Open wound of plantar aspect of foot   CURRENT MEDS:    acetaminophen  650 mg Oral Q6H   amLODipine  5 mg Oral Daily   And   hydrochlorothiazide  12.5 mg Oral Daily   aspirin EC  81 mg Oral Daily    atorvastatin  40 mg Oral Daily   famotidine  40 mg Oral BID   heparin  5,000 Units Subcutaneous Q8H   insulin aspart  0-20 Units Subcutaneous TID WC   insulin aspart  0-5 Units Subcutaneous QHS   insulin aspart  3 Units Subcutaneous TID WC   insulin glargine-yfgn  14 Units Subcutaneous QHS   loratadine  10 mg Oral Daily   montelukast  10 mg Oral QHS   polyethylene glycol  17 g Oral Daily   sodium chloride flush  3 mL Intravenous Q12H    Waverly Ferrari Office: [phone] 01/23/2023

## 2023-01-24 DIAGNOSIS — R7401 Elevation of levels of liver transaminase levels: Secondary | ICD-10-CM | POA: Insufficient documentation

## 2023-01-24 LAB — CBC WITH DIFFERENTIAL/PLATELET
Abs Immature Granulocytes: 0.45 10*3/uL — ABNORMAL HIGH (ref 0.00–0.07)
Basophils Absolute: 0.1 10*3/uL (ref 0.0–0.1)
Basophils Relative: 0 %
Eosinophils Absolute: 0.3 10*3/uL (ref 0.0–0.5)
Eosinophils Relative: 2 %
HCT: 26.9 % — ABNORMAL LOW (ref 39.0–52.0)
Hemoglobin: 9 g/dL — ABNORMAL LOW (ref 13.0–17.0)
Immature Granulocytes: 2 %
Lymphocytes Relative: 21 %
Lymphs Abs: 3.9 10*3/uL (ref 0.7–4.0)
MCH: 29 pg (ref 26.0–34.0)
MCHC: 33.5 g/dL (ref 30.0–36.0)
MCV: 86.8 fL (ref 80.0–100.0)
Monocytes Absolute: 1.3 10*3/uL — ABNORMAL HIGH (ref 0.1–1.0)
Monocytes Relative: 7 %
Neutro Abs: 12.5 10*3/uL — ABNORMAL HIGH (ref 1.7–7.7)
Neutrophils Relative %: 68 %
Platelets: 452 10*3/uL — ABNORMAL HIGH (ref 150–400)
RBC: 3.1 MIL/uL — ABNORMAL LOW (ref 4.22–5.81)
RDW: 12.9 % (ref 11.5–15.5)
WBC: 18.5 10*3/uL — ABNORMAL HIGH (ref 4.0–10.5)
nRBC: 0 % (ref 0.0–0.2)

## 2023-01-24 LAB — GLUCOSE, CAPILLARY
Glucose-Capillary: 273 mg/dL — ABNORMAL HIGH (ref 70–99)
Glucose-Capillary: 261 mg/dL — ABNORMAL HIGH (ref 70–99)
Glucose-Capillary: 202 mg/dL — ABNORMAL HIGH (ref 70–99)
Glucose-Capillary: 171 mg/dL — ABNORMAL HIGH (ref 70–99)

## 2023-01-24 LAB — COMPREHENSIVE METABOLIC PANEL
ALT: 159 U/L — ABNORMAL HIGH (ref 0–44)
AST: 96 U/L — ABNORMAL HIGH (ref 15–41)
Albumin: 2.4 g/dL — ABNORMAL LOW (ref 3.5–5.0)
Alkaline Phosphatase: 179 U/L — ABNORMAL HIGH (ref 38–126)
Anion gap: 7 (ref 5–15)
BUN: 27 mg/dL — ABNORMAL HIGH (ref 6–20)
CO2: 25 mmol/L (ref 22–32)
Calcium: 8.4 mg/dL — ABNORMAL LOW (ref 8.9–10.3)
Chloride: 98 mmol/L (ref 98–111)
Creatinine, Ser: 1.47 mg/dL — ABNORMAL HIGH (ref 0.61–1.24)
GFR, Estimated: 57 mL/min — ABNORMAL LOW (ref >60)
Glucose, Bld: 300 mg/dL — ABNORMAL HIGH (ref 70–99)
Potassium: 4 mmol/L (ref 3.5–5.1)
Sodium: 130 mmol/L — ABNORMAL LOW (ref 135–145)
Total Bilirubin: 0.4 mg/dL (ref 0.3–1.2)
Total Protein: 6.6 g/dL (ref 6.5–8.1)

## 2023-01-24 NOTE — Progress Notes (Signed)
Patient assisted with rolling walker to get out of bed to chair. Patient to chair to eat lunch. Areya Lemmerman, Randall An rN

## 2023-01-24 NOTE — Assessment & Plan Note (Addendum)
No elevation of AST/ALT 96/159 with elevated alk phos of 179. Unlikely related to infection given negative Bcx - monitor CMP

## 2023-01-24 NOTE — Progress Notes (Addendum)
  Progress Note    01/24/2023 8:16 AM 2 Days Post-Op  Subjective:  frustrated about missing work. No pain or issues overnight    Vitals:   01/24/23 0024 01/24/23 0622  BP: 127/72 (!) 141/76  Pulse: 74 80  Resp: 16 19  Temp: 98.5 F (36.9 C) 98.3 F (36.8 C)  SpO2: 99% 98%    Physical Exam: Cardiac:  regular Lungs:  nonlabored Extremities:  Brisk right AT doppler signal. Right open TMA with wound vac with good seal   CBC    Component Value Date/Time   WBC 18.5 (H) 01/24/2023 0102   RBC 3.10 (L) 01/24/2023 0102   HGB 9.0 (L) 01/24/2023 0102   HCT 26.9 (L) 01/24/2023 0102   PLT 452 (H) 01/24/2023 0102   MCV 86.8 01/24/2023 0102   MCH 29.0 01/24/2023 0102   MCHC 33.5 01/24/2023 0102   RDW 12.9 01/24/2023 0102   LYMPHSABS 3.9 01/24/2023 0102   MONOABS 1.3 (H) 01/24/2023 0102   EOSABS 0.3 01/24/2023 0102   BASOSABS 0.1 01/24/2023 0102    BMET    Component Value Date/Time   NA 130 (L) 01/24/2023 0102   NA 142 11/17/2021 1732   K 4.0 01/24/2023 0102   CL 98 01/24/2023 0102   CO2 25 01/24/2023 0102   GLUCOSE 300 (H) 01/24/2023 0102   BUN 27 (H) 01/24/2023 0102   BUN 37 (H) 11/17/2021 1732   CREATININE 1.47 (H) 01/24/2023 0102   CREATININE 0.78 07/10/2020 0910   CALCIUM 8.4 (L) 01/24/2023 0102   GFRNONAA 57 (L) 01/24/2023 0102   GFRNONAA 106 07/10/2020 0910   GFRAA 123 07/10/2020 0910    INR    Component Value Date/Time   INR 1.1 05/30/2020 1049     Intake/Output Summary (Last 24 hours) at 01/24/2023 0816 Last data filed at 01/24/2023 0028 Gross per 24 hour  Intake 420 ml  Output 1705 ml  Net -1285 ml      Assessment/Plan:  52 y.o. male is 2 days post op, s/p: right TMA washout with wound vac change   -His right foot is warm and well perfused with a brisk AT doppler signal -R open TMA with wound vac with good seal. I have placed orders for a home wound vac. He will also need HHRN for wound vac changes -Will perform right foot wound vac change at  beside tomorrow  Loel Dubonnet, PA-C [facility] [phone] 01/24/2023 8:16 AM  I have interviewed the patient and examined the patient. I agree with the findings by the PA.  His VAC has a good seal.  He was sound asleep so I did not wake him up.  Cari Caraway, MD 10:56 AM

## 2023-01-24 NOTE — Progress Notes (Signed)
Daily Progress Note Intern Pager: [phone]  Patient name: MALICAH LOEZA Medical record number: [id number] Date of birth: 1970-12-04 Age: 52 y.o. Gender: male  Primary Care Provider: Levin Erp, MD Consultants: Vascular surgery Code Status: Full  Pt Overview and Major Events to Date:  3/29-admitted 4/2-TMA 4/5-wound washout without closure  Assessment and Plan: IDA VENKATARAMAN is a 52 y.o. male PMH significant for DM2, HTN, and PAD presenting meeting sepsis criteria with worsening R diabetic foot ulcer, now s/p TMA.  * Diabetic infection of right foot POD 5 TMA. Wound gram stain mod GNR and few GPC in pairs. Wound culture with rare GBS (positive B lactamase), rare prevotella bivia, few fusobacterium nucleatum, rare eikenella corrodens. Bcx no growth. WBC improving, afebrile. -VVS following, appreciate recs, planned for VAC change on Monday -De-escalate to cefepime only (3/29-4/6) and stop vanc given G- isolation (3/29-4-6). Then transitioned to Unasyn on 4/6.  -Pain regimen: Tylenol 650mg  q6h sch, Oxycodone 5mg  q6h prn -Consult PT/OT when wound closed  AKI (acute kidney injury) Cr increased to 1.47 (baseline around 1.1) -Continue to monitor  Hypertension associated with diabetes Stable. -Continue home Amlodipine 5 mg / HCTZ 12.5 mg -Consider adding back ARB if continues to rise  Diabetes mellitus type 2 with peripheral artery disease A1c 7.2. Home meds Basaglar 28U daily and Humalog 8U TID. CBG worsened recently with recent Dexamethasone. Glargine increase will benefit him, may just need further mealtime dosing via sliding scale. Would benefit from CBGs<180-200 given active wound/infection. -Continue rSSI with QHS coverage as well as meal coverage -Continue home Semglee dose at 28u -CBG checks  Restrictive lung disease Mild restrictive lung disease with response to albuterol on PFTs in 08/2022. Continues to have mild cough. No wheezing noted. CXR negative for acute  process. -Albuterol q6h prn -Continue home Montelukast -Tessalon perles prn -Continue claritin daily  Transaminitis No elevation of AST/ALT 96/159 with elevated alk phos of 179. Unlikely related to infection given negative Bcx - monitor CMP  FEN/GI: Carb modified PPx: Heparin every 8 hours Dispo: pending recs with wound closure .  Subjective:  Patient states his pain is well-controlled, his appetite has not been affected.  He has been unable to sleep well in the hospital.  Objective: Temp:  [98 F (36.7 C)-98.5 F (36.9 C)] 98.3 F (36.8 C) (04/07 0622) Pulse Rate:  [67-80] 80 (04/07 0622) Resp:  [12-20] 19 (04/07 0622) BP: (106-141)/(56-76) 141/76 (04/07 0622) SpO2:  [97 %-100 %] 98 % (04/07 0622) Physical Exam: General: Alert and oriented, NAD Cardiovascular: RRR, no murmurs auscultated Respiratory: CTAB, normal WOB Extremities: Moves all extremities appropriately, RLE with bandage and wound VAC in place  Laboratory: Most recent CBC Lab Results  Component Value Date   WBC 18.5 (H) 01/24/2023   HGB 9.0 (L) 01/24/2023   HCT 26.9 (L) 01/24/2023   MCV 86.8 01/24/2023   PLT 452 (H) 01/24/2023   Most recent BMP    Latest Ref Rng & Units 01/24/2023    1:02 AM  BMP  Glucose 70 - 99 mg/dL 010   BUN 6 - 20 mg/dL 27   Creatinine 9.32 - 1.24 mg/dL 3.55   Sodium 732 - 202 mmol/L 130   Potassium 3.5 - 5.1 mmol/L 4.0   Chloride 98 - 111 mmol/L 98   CO2 22 - 32 mmol/L 25   Calcium 8.9 - 10.3 mg/dL 8.4    CBG (last 3)  Recent Labs    01/23/23 1623 01/23/23 2132 01/24/23 5427  GLUCAP 241* 289* 273*     Imaging/Diagnostic Tests: No recent imaging Shelby Mattocks, DO 01/24/2023, 7:04 AM  PGY-2, [facility] Family [facility] FPTS Intern pager: [phone], text pages welcome Secure chat group [facility] Teaching Service

## 2023-01-25 LAB — COMPREHENSIVE METABOLIC PANEL
ALT: 109 U/L — ABNORMAL HIGH (ref 0–44)
AST: 40 U/L (ref 15–41)
Albumin: 2.4 g/dL — ABNORMAL LOW (ref 3.5–5.0)
Alkaline Phosphatase: 161 U/L — ABNORMAL HIGH (ref 38–126)
Anion gap: 7 (ref 5–15)
BUN: 22 mg/dL — ABNORMAL HIGH (ref 6–20)
CO2: 26 mmol/L (ref 22–32)
Calcium: 8.6 mg/dL — ABNORMAL LOW (ref 8.9–10.3)
Chloride: 99 mmol/L (ref 98–111)
Creatinine, Ser: 1.08 mg/dL (ref 0.61–1.24)
GFR, Estimated: >60 mL/min (ref >60)
Glucose, Bld: 186 mg/dL — ABNORMAL HIGH (ref 70–99)
Potassium: 4.1 mmol/L (ref 3.5–5.1)
Sodium: 132 mmol/L — ABNORMAL LOW (ref 135–145)
Total Bilirubin: 0.5 mg/dL (ref 0.3–1.2)
Total Protein: 6.5 g/dL (ref 6.5–8.1)

## 2023-01-25 LAB — CBC WITH DIFFERENTIAL/PLATELET
Abs Immature Granulocytes: 0.56 10*3/uL — ABNORMAL HIGH (ref 0.00–0.07)
Basophils Absolute: 0.1 10*3/uL (ref 0.0–0.1)
Basophils Relative: 1 %
Eosinophils Absolute: 0.3 10*3/uL (ref 0.0–0.5)
Eosinophils Relative: 2 %
HCT: 27.8 % — ABNORMAL LOW (ref 39.0–52.0)
Hemoglobin: 9.3 g/dL — ABNORMAL LOW (ref 13.0–17.0)
Immature Granulocytes: 4 %
Lymphocytes Relative: 20 %
Lymphs Abs: 3.2 10*3/uL (ref 0.7–4.0)
MCH: 28.9 pg (ref 26.0–34.0)
MCHC: 33.5 g/dL (ref 30.0–36.0)
MCV: 86.3 fL (ref 80.0–100.0)
Monocytes Absolute: 1.2 10*3/uL — ABNORMAL HIGH (ref 0.1–1.0)
Monocytes Relative: 8 %
Neutro Abs: 10.8 10*3/uL — ABNORMAL HIGH (ref 1.7–7.7)
Neutrophils Relative %: 65 %
Platelets: 517 10*3/uL — ABNORMAL HIGH (ref 150–400)
RBC: 3.22 MIL/uL — ABNORMAL LOW (ref 4.22–5.81)
RDW: 12.9 % (ref 11.5–15.5)
WBC: 16.2 10*3/uL — ABNORMAL HIGH (ref 4.0–10.5)
nRBC: 0 % (ref 0.0–0.2)

## 2023-01-25 LAB — GLUCOSE, CAPILLARY
Glucose-Capillary: 158 mg/dL — ABNORMAL HIGH (ref 70–99)
Glucose-Capillary: 172 mg/dL — ABNORMAL HIGH (ref 70–99)
Glucose-Capillary: 195 mg/dL — ABNORMAL HIGH (ref 70–99)
Glucose-Capillary: 335 mg/dL — ABNORMAL HIGH (ref 70–99)

## 2023-01-25 LAB — HEPATITIS PANEL, ACUTE
HCV Ab: NONREACTIVE
Hep A IgM: NONREACTIVE
Hep B C IgM: NONREACTIVE
Hepatitis B Surface Ag: NONREACTIVE

## 2023-01-25 MED ORDER — ENOXAPARIN SODIUM 40 MG/0.4ML IJ SOSY
40.0000 mg | PREFILLED_SYRINGE | INTRAMUSCULAR | Status: DC
  Administered 2023-01-26: 40 mg via SUBCUTANEOUS
  Filled 2023-01-25: qty 0.4

## 2023-01-25 MED ORDER — AMOXICILLIN-POT CLAVULANATE 875-125 MG PO TABS
1.0000 | ORAL_TABLET | Freq: Two times a day (BID) | ORAL | Status: DC
  Administered 2023-01-25 – 2023-01-26 (×2): 1 via ORAL
  Filled 2023-01-25 (×2): qty 1

## 2023-01-25 NOTE — Progress Notes (Addendum)
      Subjective  - He states he will be living in an apartment if he is dicharged.  He doesn't want to be discharged with the vac.   Objective (!) 142/80 71 98.4 F (36.9 C) (Oral) 18 96%  Intake/Output Summary (Last 24 hours) at 01/25/2023 0743 Last data filed at 01/25/2023 0600 Gross per 24 hour  Intake 660 ml  Output 2200 ml  Net -1540 ml    Vac with good suction, minimal drainage Brisk AT signal (palpable right DP) Lungs non labored breathing    Assessment/Planning: Right TMA followed by I & D with signs of infection 01/22/23 returned to the OR with Dr. Randie Heinz and he was able to close partial tissue closure over the TMA Plan for Vac change at bedside today with Dr. Randie Heinz  Discharge planning pending based on vac change today Wet to dry dressing TMA Home health RN for wound checks. Non weight bearing knee rover scooter/walker   Mosetta Pigeon 01/25/2023 7:43 AM --  Laboratory Lab Results: Recent Labs    01/24/23 0102 01/25/23 0113  WBC 18.5* 16.2*  HGB 9.0* 9.3*  HCT 26.9* 27.8*  PLT 452* 517*   BMET Recent Labs    01/24/23 0102 01/25/23 0113  NA 130* 132*  K 4.0 4.1  CL 98 99  CO2 25 26  GLUCOSE 300* 186*  BUN 27* 22*  CREATININE 1.47* 1.08  CALCIUM 8.4* 8.6*    COAG Lab Results  Component Value Date   INR 1.1 05/30/2020   INR 0.9 01/10/2008   No results found for: "PTT"   I have independently interviewed and examined patient and agree with PA assessment and plan above.  Patient really hopes to avoid wound VAC given need to work.  He is okay for twice daily wet-to-dry dressings and needs to be heel weightbearing only and he demonstrates good understanding of this.  Okay for discharge from vascular standpoint will have him follow-up in a few weeks for wound check.  Dencil Cayson C. Randie Heinz, MD [facility] [place] Office: [phone] Pager: [phone]

## 2023-01-25 NOTE — Progress Notes (Signed)
Daily Progress Note Intern Pager: [phone]  Patient name: Sean Carter Medical record number: [id number] Date of birth: Feb 04, 1971 Age: 52 y.o. Gender: male  Primary Care Provider: Levin Erp, MD Consultants: Vascular surgery Code Status: Full   Pt Overview and Major Events to Date:  3/29-admitted 4/2-TMA 4/5-wound washout without closure 4/8-wound vac replaced   Assessment and Plan: Sean Carter is a 52 y.o. male PMH significant for DM2, HTN, and PAD presenting meeting sepsis criteria with worsening R diabetic foot ulcer, now s/p TMA.  * Diabetic infection of right foot POD 6 TMA. Wound gram stain mod GNR and few GPC in pairs. Wound culture with rare GBS (positive B lactamase), rare prevotella bivia, few fusobacterium nucleatum, rare eikenella corrodens. Bcx no growth. WBC improving, afebrile. -VVS following, appreciate recs, planned for VAC change on Monday -De-escalate to cefepime only (3/29-4/6) and stop vanc given G- isolation (3/29-4-6). Then transitioned to Unasyn (4/6-). Plan for augmentin course for 4 weeks at discharge. -Pain regimen: Tylenol 650mg  q6h sch, Oxycodone 5mg  q6h prn -Consult PT/OT likely outpatient after wound closure  AKI (acute kidney injury) Waxing and waning. Cr this AM 1.08 (baseline around 1.1). -Continue to monitor, encourage good PO  Hypertension associated with diabetes Stable. -Continue home Amlodipine 5 mg / HCTZ 12.5 mg -Consider adding back ARB if continues to rise  Diabetes mellitus type 2 with peripheral artery disease A1c 7.2. Home meds Basaglar 28U daily and Humalog 8U TID. CBG worsened recently with recent Dexamethasone. Glargine increase will benefit him, may just need further mealtime dosing via sliding scale. Would benefit from CBGs<180-200 given active wound/infection. -Continue rSSI with QHS coverage as well as meal coverage -Continue home Semglee dose at 28u -CBG checks  Restrictive lung disease Mild restrictive  lung disease with response to albuterol on PFTs in 08/2022. Continues to have mild cough. No wheezing noted. CXR negative for acute process. -Albuterol q6h prn -Continue home Montelukast -Tessalon perles prn -Continue claritin daily  Transaminitis Elevation of AST/ALT 96/159 with elevated alk phos of 179. Unlikely related to infection given negative Bcx. Consider transient ischemia during surgery. Improving. - monitor CMP  FEN/GI: Carb modified PPx: Heparin every 8 hours Dispo: Home likely tomorrow when transport is obtained  Subjective:  Doing well this morning. Does not want to go home on a wound vac.  Objective: Temp:  [98.2 F (36.8 C)-98.5 F (36.9 C)] 98.3 F (36.8 C) (04/08 0954) Pulse Rate:  [65-79] 73 (04/08 0954) Resp:  [15-20] 18 (04/08 0954) BP: (121-157)/(70-81) 121/70 (04/08 0954) SpO2:  [96 %-100 %] 97 % (04/08 0954) Physical Exam: General: Alert and oriented, in NAD Skin: Warm, dry HEENT: NCAT, EOM grossly normal, midline nasal septum Cardiac: RRR, no m/r/g appreciated Respiratory: CTAB, breathing and speaking comfortably on RA Abdominal: Soft, nontender, nondistended, normoactive bowel sounds Extremities: Moves all extremities grossly equally, RLE bandaged with wound vac intact draining dark fluid Neurological: No gross focal deficit Psychiatric: Appropriate mood and affect   Laboratory: Most recent CBC Lab Results  Component Value Date   WBC 16.2 (H) 01/25/2023   HGB 9.3 (L) 01/25/2023   HCT 27.8 (L) 01/25/2023   MCV 86.3 01/25/2023   PLT 517 (H) 01/25/2023   Most recent BMP    Latest Ref Rng & Units 01/25/2023    1:13 AM  BMP  Glucose 70 - 99 mg/dL 741   BUN 6 - 20 mg/dL 22   Creatinine 6.38 - 1.24 mg/dL 4.53   Sodium 646 -  145 mmol/L 132   Potassium 3.5 - 5.1 mmol/L 4.1   Chloride 98 - 111 mmol/L 99   CO2 22 - 32 mmol/L 26   Calcium 8.9 - 10.3 mg/dL 8.6    Janeal Holmes, MD 01/25/2023, 1:24 PM PGY-1, [facility] Family Medicine FPTS  Intern pager: [phone], text pages welcome Secure chat group [facility] Teaching Service

## 2023-01-25 NOTE — TOC Transition Note (Signed)
Transition of Care (TOC) - CM/SW Discharge Note Donn Pierini RN, BSN Transitions of Care Unit 4E- RN Case Manager See Treatment Team for direct phone #   Patient Details  Name: [name] MRN: [id number] Date of Birth: 1971/09/10  Transition of Care [facility]) CM/SW Contact:  Darrold Span, RN Phone Number: 01/25/2023, 12:41 PM   Clinical Narrative:    CM spoke with pt at bedside to discuss transition needs, Pt has order for [facility]. Wound VAC has been removed this am- and plan for w-d drsg changes.   Discussed with pt HHRN order and VSS office referral to [facility]- pt voiced he has had HH in past w/ [facility] and did not feel HH was helpful- Pt voiced his wife ended up doing drsg changes.   Per pt his wife will be able to assist with drsg changes this time as well, and pt voiced he is planning on returning to work this week. Pt is declining HH at this time.  CM has notified [facility] liaison who will also follow up with pt regarding HH needs.   Discussed DME needs as well- pt requesting a knee scooter, this DME is not in stock by in house provider- discussed with pt local DME providers that he could go to get one at. Pt to f/u and purchase from local DME store.   Pt also expressed that he wife would not be available later today for transport home, and he would need to transition home tomorrow after everything is taken care of today as far as abx needs, etc. Pt voiced that they are currently staying in hotel as they are in the process of moving and will not be in their house until next week.                Attending team and VSS updated on above.          Final next level of care: Home/Self Care Barriers to Discharge: Continued Medical Work up   Patient Goals and CMS Choice CMS Medicare.gov Compare Post Acute Care list provided to:: Patient Choice offered to / list presented to : Patient  Discharge Placement    home    Discharge Plan and Services Additional resources  added to the After Visit Summary for     Discharge Planning Services: CM Consult Post Acute Care Choice: Home Health, Durable Medical Equipment          DME Arranged: N/A DME Agency: NA       HH Arranged: RN, Patient Refused [facility] HH Agency: [facility] Date [facility] Agency Contacted: 01/25/23 Time HH Agency Contacted: 1020 Representative spoke with at [facility] Agency: Bjorn Loser  Social Determinants of Health (SDOH) Interventions SDOH Screenings   Food Insecurity: No Food Insecurity (01/18/2023)  Housing: Low Risk  (01/18/2023)  Transportation Needs: No Transportation Needs (01/18/2023)  Utilities: Not At Risk (01/18/2023)  Depression (PHQ2-9): Low Risk  (08/28/2022)  Tobacco Use: Low Risk  (01/23/2023)     Readmission Risk Interventions    01/25/2023   12:41 PM  Readmission Risk Prevention Plan  Transportation Screening Complete  PCP or Specialist Appt within 5-7 Days Complete  Home Care Screening Complete  Medication Review (RN CM) Complete

## 2023-01-26 ENCOUNTER — Other Ambulatory Visit (HOSPITAL_COMMUNITY): Payer: Self-pay

## 2023-01-26 LAB — COMPREHENSIVE METABOLIC PANEL
ALT: 87 U/L — ABNORMAL HIGH (ref 0–44)
AST: 33 U/L (ref 15–41)
Albumin: 2.4 g/dL — ABNORMAL LOW (ref 3.5–5.0)
Alkaline Phosphatase: 147 U/L — ABNORMAL HIGH (ref 38–126)
Anion gap: 11 (ref 5–15)
BUN: 22 mg/dL — ABNORMAL HIGH (ref 6–20)
CO2: 27 mmol/L (ref 22–32)
Calcium: 8.8 mg/dL — ABNORMAL LOW (ref 8.9–10.3)
Chloride: 95 mmol/L — ABNORMAL LOW (ref 98–111)
Creatinine, Ser: 0.99 mg/dL (ref 0.61–1.24)
GFR, Estimated: >60 mL/min (ref >60)
Glucose, Bld: 208 mg/dL — ABNORMAL HIGH (ref 70–99)
Potassium: 4 mmol/L (ref 3.5–5.1)
Sodium: 133 mmol/L — ABNORMAL LOW (ref 135–145)
Total Bilirubin: 0.3 mg/dL (ref 0.3–1.2)
Total Protein: 6.6 g/dL (ref 6.5–8.1)

## 2023-01-26 LAB — GLUCOSE, CAPILLARY: Glucose-Capillary: 162 mg/dL — ABNORMAL HIGH (ref 70–99)

## 2023-01-26 MED ORDER — ACETAMINOPHEN 325 MG PO TABS: 650.0000 mg | ORAL_TABLET | Freq: Four times a day (QID) | ORAL | Status: DC | PRN

## 2023-01-26 MED ORDER — AMOXICILLIN-POT CLAVULANATE 875-125 MG PO TABS
1.0000 | ORAL_TABLET | Freq: Two times a day (BID) | ORAL | 0 refills | Status: AC
  Filled 2023-01-26: qty 56, 28d supply, fill #0

## 2023-01-26 MED ORDER — TRAMADOL HCL 50 MG PO TABS: 50.0000 mg | ORAL_TABLET | Freq: Two times a day (BID) | ORAL | 0 refills | Status: DC | PRN

## 2023-01-26 MED ORDER — ASPIRIN 81 MG PO TBEC: 81.0000 mg | DELAYED_RELEASE_TABLET | Freq: Every day | ORAL | 12 refills | Status: DC

## 2023-01-26 NOTE — Progress Notes (Addendum)
      Subjective  - Comfortable and ready to go home   Objective (!) 162/82 80 98.4 F (36.9 C) (Oral) 19 100%  Intake/Output Summary (Last 24 hours) at 01/26/2023 3559 Last data filed at 01/26/2023 0600 Gross per 24 hour  Intake 1400 ml  Output 2200 ml  Net -800 ml   Wet to dry dressing right TMA  AT doppler signal intact Lungs non labored breathing   Assessment/Planning: S/P right tMA with Irrigation and partial closure Plan for wet to dry dressing changes daily Heel weight bearing in Darco shoe for mobility Patient refusing HH, plan for wife to do dressing changes Stable for discharge today Tramadol sent to pharmacy F/U in 2-3 weeks for wound check  Mosetta Pigeon 01/26/2023 7:11 AM --  Laboratory Lab Results: Recent Labs    01/24/23 0102 01/25/23 0113  WBC 18.5* 16.2*  HGB 9.0* 9.3*  HCT 26.9* 27.8*  PLT 452* 517*   BMET Recent Labs    01/25/23 0113 01/26/23 0306  NA 132* 133*  K 4.1 4.0  CL 99 95*  CO2 26 27  GLUCOSE 186* 208*  BUN 22* 22*  CREATININE 1.08 0.99  CALCIUM 8.6* 8.8*    COAG Lab Results  Component Value Date   INR 1.1 05/30/2020   INR 0.9 01/10/2008   No results found for: "PTT"   I have independently interviewed and examined patient and agree with PA assessment and plan above. Healing well. Needs to be heel wt bear only. Will f/u in a few weeks for wound check.   Seon Gaertner C. Randie Heinz, MD [facility] Office: [phone] Pager: [phone]

## 2023-01-26 NOTE — Discharge Summary (Cosign Needed Addendum)
Family Medicine Teaching [facility] Discharge Summary  Patient name: Sean Carter Medical record number: [id number] Date of birth: 1971-07-02 Age: 52 y.o. Gender: male Date of Admission: 01/15/2023  Date of Discharge: 01/26/2023 Admitting Physician: Carney Living, MD  Primary Care Provider: Levin Erp, MD Consultants: VVS  Indication for Hospitalization: Diabetic foot wound requiring TMA  Brief Hospital Course:  QUANTAVIUS REISINGER is a 52 y.o.male with a history of T2DM, PAD, HTN and h/o multiple toe amputations who was admitted to the [facility] Medicine Teaching Service at [facility] for diabetic R foot ulcer. His hospital course is detailed below:  Diabetic R foot wound Admitted with large ulceration on plantar surface of R foot with purulent drainage and significant erythema, edema, and warmth extending to the R LE. VVS consulted and recommended IV abx and arteriogram to determine amputation site. BC obtained after Vancomycin was started and then continued on Cefepime/Vanc. Arteriogram with occluded proximal posterior tibial artery. Transmetatarsal amputation performed on 4/3 and wound vac placed. He was taken for wound washout on 4/5 to remove residual infection. Wound cx collected which demonstrated rare GBS (positive B lactamase), rare prevotella bivia, few fusobacterium nucleatum, rare eikenella corrodens. Bcx had no growth. He was transitioned to IV Unasyn alone. By discharge, his wound was healing well, he was transitioned to augmentin for 4 weeks, and he was going to schedule outpatient diabetes/VVS follow up.  AKI Presented with Cr 1.50 from baseline around 1.0-1.1. Likely prerenal. Cr improved with IVF and increasing PO. By discharge, Cr was 0.99.  Other chronic conditions were medically managed with home medications and formulary alternatives as necessary: HTN (held ARB given AKI), T2DM (given rSSI, 3u TID with meals, and ultimately home dose of LAI 28u), restrictive lung  disease with allergies (continued home montelukast and added claritin, tessalon perles).  PCP Follow-up Recommendations:  Ensure continued surgical healing and abx completion  Assess glucose control on insulin regimen Evaluate renal function and LFTs Assess pain from subacute-chronic compression fracture appreciated on CXR  Discharge Diagnoses/Problem List:  Principal Problem for Admission: Diabetic foot wound requiring TMA Other Problems addressed during stay:  Present on Admission:  Diabetes mellitus type 2 with peripheral artery disease  Hypertension associated with diabetes  Sepsis  Disposition: Home  Discharge Condition: Stable  Discharge Exam:  Blood pressure 136/83, pulse 64, temperature 98.3 F (36.8 C), temperature source Oral, resp. rate 16, height 5\' 8"  (1.727 m), weight 91.6 kg, SpO2 100 %.  General: Alert and oriented, in NAD Skin: Warm, dry HEENT: NCAT, EOM grossly normal, midline nasal septum Cardiac: RRR, no m/r/g appreciated Respiratory: CTAB, breathing and speaking comfortably on RA Abdominal: Soft, nontender, nondistended, normoactive bowel sounds Extremities: Moves all extremities grossly equally, RLE bandaged, wound vac removed Neurological: No gross focal deficit Psychiatric: Appropriate mood and affect   Significant Procedures: TMA with wound washouts  Significant Labs and Imaging:  Recent Labs  Lab 01/25/23 0113  WBC 16.2*  HGB 9.3*  HCT 27.8*  PLT 517*   Recent Labs  Lab 01/25/23 0113 01/26/23 0306  NA 132* 133*  K 4.1 4.0  CL 99 95*  CO2 26 27  GLUCOSE 186* 208*  BUN 22* 22*  CREATININE 1.08 0.99  CALCIUM 8.6* 8.8*  ALKPHOS 161* 147*  AST 40 33  ALT 109* 87*  ALBUMIN 2.4* 2.4*   XR R foot IMPRESSION: Soft tissue swelling and soft tissue gas at the third toe consistent with soft tissue infection and questioned corresponding to reported ulcer.  No definite radiographic evidence of osteomyelitis identified. Prior amputations  through the first and second rays through the distal metatarsals.  CXR IMPRESSION: Clear lungs. Compression fracture at thoracolumbar junction question subacute to old though new since 2021.  Discharge Medications:  Allergies as of 01/26/2023       Reactions   Scallops [shellfish Allergy] Anaphylaxis        Medication List     TAKE these medications    acetaminophen 325 MG tablet Commonly known as: TYLENOL Take 2 tablets (650 mg total) by mouth every 6 (six) hours as needed for headache or mild pain.   albuterol 108 (90 Base) MCG/ACT inhaler Commonly known as: VENTOLIN HFA INHALE 2 PUFFS BY MOUTH EVERY 6 HOURS AS NEEDED FOR WHEEZING OR SHORTNESS OF BREATH What changed: See the new instructions.   amLODIPine-Valsartan-HCTZ 5-160-12.5 MG Tabs Take 1 tablet by mouth daily at 8 pm. What changed: when to take this   amoxicillin-clavulanate 875-125 MG tablet Commonly known as: AUGMENTIN Take 1 tablet by mouth every 12 (twelve) hours for 28 days. Start tonight (01/26/2023).   aspirin EC 81 MG tablet Take 1 tablet (81 mg total) by mouth daily. Swallow whole.   atorvastatin 40 MG tablet Commonly known as: LIPITOR Take 1 tablet by mouth once daily   Basaglar KwikPen 100 UNIT/ML DIAL AND INJECT 28 UNITS UNDER THE SKIN ONCE DAILY IN THE MORNING. MAX DAILY DOSE 28 UNITS What changed: See the new instructions.   brompheniramine-pseudoephedrine-DM 30-2-10 MG/5ML syrup Take 5 mLs by mouth 4 (four) times daily as needed.   cetirizine 10 MG tablet Commonly known as: ZYRTEC Take 1 tablet (10 mg total) by mouth daily.   diphenhydrAMINE 25 MG tablet Commonly known as: BENADRYL Take 25 mg by mouth at bedtime.   famotidine 40 MG tablet Commonly known as: PEPCID Take 1 tablet by mouth twice daily   insulin lispro 100 UNIT/ML injection Commonly known as: HUMALOG Inject 0.08 mLs (8 Units total) into the skin 3 (three) times daily before meals.   loratadine 10 MG  tablet Commonly known as: CLARITIN Take 1 tablet (10 mg total) by mouth daily.   montelukast 10 MG tablet Commonly known as: SINGULAIR TAKE 1 TABLET BY MOUTH AT BEDTIME   multivitamin tablet Take 1 tablet by mouth daily.   ReliOn All-In-One Devi 1 each by Does not apply route 3 (three) times daily as needed.   ReliOn Lancets Thin 26G Misc 1 each by Does not apply route 3 (three) times daily as needed.   traMADol 50 MG tablet Commonly known as: ULTRAM Take 1 tablet (50 mg total) by mouth every 12 (twelve) hours as needed for severe pain. What changed: when to take this   UltiCare Insulin Syringe 30G X 1/2" 0.3 ML Misc Generic drug: Insulin Syringe-Needle U-100 See admin instructions.       Discharge Instructions: Please refer to Patient Instructions section of EMR for full details.  Patient was counseled important signs and symptoms that should prompt return to medical care, changes in medications, dietary instructions, activity restrictions, and follow up appointments.   Follow-Up Appointments:  Follow-up Information     Program, [facility]. Schedule an appointment as soon as possible for a visit.   Why: Make an appointment ASAP for hospital follow up. Contact information: [address] [place] [phone]         Maeola Harmanain, Brandon Christopher, MD Follow up in 2 week(s).   Specialties: Vascular Surgery, Cardiology  Why: Office will call you to arrange your appt (sent) Contact information: [address] [place] [address] [phone]                 I reviewed medical decision making and verified the service and findings are accurately documented in the intern's note.  Erick Alley, DO 01/26/2023 12:53 PM  Janeal Holmes, MD 01/26/2023, 9:33 AM PGY-1, [facility]

## 2023-01-26 NOTE — Progress Notes (Addendum)
Discharge instructions reviewed with pt.  Copy of instructions given to pt. [facility] filled 1 script, and pt's home pharmacy filled another script, pt will pick up TOC med on the way out and will get other on way home.  Reviewed wound care with pt, pt has supplies, pt's wife will be assisting with dressing changes, pt states wife has been doing dressing changes for him.   Pt d/c'd via wheelchair with belongings, with wife at front entrance.              Escorted by staff.

## 2023-01-27 ENCOUNTER — Telehealth: Payer: Self-pay

## 2023-01-27 NOTE — Transitions of Care (Post Inpatient/ED Visit) (Unsigned)
   01/27/2023  Name: Sean Carter MRN: [id number] DOB: 27-Jun-1971  Today's TOC FU Call Status: Today's TOC FU Call Status:: Unsuccessul Call (1st Attempt) Unsuccessful Call (1st Attempt) Date: 01/27/23  Attempted to reach the patient regarding the most recent Inpatient/ED visit.  Follow Up Plan: Additional outreach attempts will be made to reach the patient to complete the Transitions of Care (Post Inpatient/ED visit) call.   Signature Karena Addison, LPN [facility] Nurse Health Advisor Direct Dial [phone]

## 2023-01-28 NOTE — Transitions of Care (Post Inpatient/ED Visit) (Signed)
   01/28/2023  Name: Sean Carter MRN: [id number] DOB: Jan 12, 1971  Today's TOC FU Call Status: Today's TOC FU Call Status:: Successful TOC FU Call Competed Unsuccessful Call (1st Attempt) Date: 01/27/23 [facility] FU Call Complete Date: 01/28/23  Transition Care Management Follow-up Telephone Call Date of Discharge: 01/26/23 Discharge Facility[facility]) Type of Discharge: Inpatient Admission Primary Inpatient Discharge Diagnosis:: cellulitis How have you been since you were released from the hospital?: Better Any questions or concerns?: No  Items Reviewed: Did you receive and understand the discharge instructions provided?: Yes Medications obtained and verified?: Yes (Medications Reviewed) Any new allergies since your discharge?: No Dietary orders reviewed?: Yes Do you have support at home?: Yes People in Home: spouse  Home Care and Equipment/Supplies: Were Home Health Services Ordered?: NA Any new equipment or medical supplies ordered?: NA  Functional Questionnaire: Do you need assistance with bathing/showering or dressing?: No Do you need assistance with meal preparation?: No Do you need assistance with eating?: No Do you have difficulty maintaining continence: No Do you need assistance with getting out of bed/getting out of a chair/moving?: No Do you have difficulty managing or taking your medications?: No  Follow up appointments reviewed: PCP Follow-up appointment confirmed?: Yes Date of PCP follow-up appointment?: 01/29/23 Follow-up Provider: [facility] Follow-up appointment confirmed?: Yes Date of Specialist follow-up appointment?: 02/15/23 Follow-Up Specialty Provider:: Dr Randie Heinz Do you need transportation to your follow-up appointment?: No Do you understand care options if your condition(s) worsen?: Yes-patient verbalized understanding    SIGNATURE Karena Addison, LPN [facility] Nurse Health Advisor Direct Dial [phone]

## 2023-01-29 ENCOUNTER — Ambulatory Visit (INDEPENDENT_AMBULATORY_CARE_PROVIDER_SITE_OTHER): Payer: Self-pay | Admitting: Student

## 2023-01-29 ENCOUNTER — Encounter: Payer: Self-pay | Admitting: Student

## 2023-01-29 VITALS — BP 126/72 | HR 73 | Ht 68.0 in | Wt 203.0 lb

## 2023-01-29 DIAGNOSIS — Z89431 Acquired absence of right foot: Secondary | ICD-10-CM

## 2023-01-29 DIAGNOSIS — L089 Local infection of the skin and subcutaneous tissue, unspecified: Secondary | ICD-10-CM

## 2023-01-29 DIAGNOSIS — R0982 Postnasal drip: Secondary | ICD-10-CM

## 2023-01-29 DIAGNOSIS — E1151 Type 2 diabetes mellitus with diabetic peripheral angiopathy without gangrene: Secondary | ICD-10-CM

## 2023-01-29 DIAGNOSIS — E11628 Type 2 diabetes mellitus with other skin complications: Secondary | ICD-10-CM

## 2023-01-29 MED ORDER — BLOOD GLUCOSE MONITOR KIT: PACK | 0 refills | Status: DC

## 2023-01-29 MED ORDER — TRAMADOL HCL 50 MG PO TABS: 50.0000 mg | ORAL_TABLET | Freq: Two times a day (BID) | ORAL | 0 refills | Status: DC | PRN

## 2023-01-29 MED ORDER — LANCET DEVICE MISC: 1.0000 | Freq: Three times a day (TID) | 0 refills | Status: AC

## 2023-01-29 MED ORDER — ALBUTEROL SULFATE HFA 108 (90 BASE) MCG/ACT IN AERS
2.0000 | INHALATION_SPRAY | Freq: Four times a day (QID) | RESPIRATORY_TRACT | 0 refills | Status: DC | PRN
Start: 2023-01-29 — End: 2023-08-03

## 2023-01-29 MED ORDER — CETIRIZINE HCL 10 MG PO TABS: 10.0000 mg | ORAL_TABLET | Freq: Every day | ORAL | 3 refills | Status: DC

## 2023-01-29 MED ORDER — BLOOD GLUCOSE MONITORING SUPPL DEVI
1.0000 | Freq: Three times a day (TID) | 0 refills | Status: DC
Start: 2023-01-29 — End: 2024-08-22

## 2023-01-29 MED ORDER — LANCETS MISC. MISC: 1.0000 | Freq: Three times a day (TID) | 0 refills | Status: AC

## 2023-01-29 MED ORDER — BLOOD GLUCOSE TEST VI STRP: 1.0000 | ORAL_STRIP | Freq: Three times a day (TID) | 2 refills | Status: AC

## 2023-01-29 NOTE — Progress Notes (Unsigned)
SUBJECTIVE:   CHIEF COMPLAINT / HPI:   Hospital f/u for diabetic foot wound -s/p transmetarsal amputation -Augmentin 4 wk for GBS infection Feeling and doing well. Wife changed bandage regularly, pain well controlled and taking tylenol.  Wound healing/pain Abx taking Bid, due to end May 7th. Pain well controlled and is being controlled by Tylenol.  Compression fracture Discussed imaging results, patient remember fall a couple years back that caused him to have back pain.   DM Not checking his sugars at home, but feels that it's been doing better.  Meds: 28 units basaglar CBG checks     PERTINENT  PMH / PSH: DM   Patient Care Team: Levin Erp, MD as PCP - General (Family Medicine) OBJECTIVE:  BP 126/72   Pulse 73   Ht 5\' 8"  (1.727 m)   Wt 203 lb (92.1 kg)   SpO2 99%   BMI 30.87 kg/m  Physical Exam Constitutional:      General: He is not in acute distress.    Appearance: Normal appearance. He is not ill-appearing, toxic-appearing or diaphoretic.  Cardiovascular:     Rate and Rhythm: Normal rate and regular rhythm.     Pulses: Normal pulses.     Heart sounds: Normal heart sounds. No murmur heard.    No friction rub. No gallop.  Pulmonary:     Effort: Pulmonary effort is normal. No respiratory distress.     Breath sounds: Normal breath sounds. No stridor. No wheezing, rhonchi or rales.  Abdominal:     General: Bowel sounds are normal. There is no distension.     Palpations: Abdomen is soft.     Tenderness: There is no abdominal tenderness.  Musculoskeletal:     Right Lower Extremity: Right leg is amputated below ankle.  Feet:     Comments: R. Foot TMA amputation w/ wound clean, no erythema, or bleeding, dressing changed in office Neurological:     Mental Status: He is alert.  Psychiatric:        Mood and Affect: Mood normal.        Behavior: Behavior normal.      ASSESSMENT/PLAN:  Diabetic infection of right foot -     Comprehensive metabolic  panel  Post-nasal drip -     Albuterol Sulfate HFA; Inhale 2 puffs into the lungs every 6 (six) hours as needed for wheezing or shortness of breath. INHALE 2 PUFFS BY MOUTH EVERY 6 HOURS AS NEEDED FOR WHEEZING OR SHORTNESS OF BREATH Strength: 108 (90 Base) MCG/ACT  Dispense: 8 g; Refill: 0  S/P transmetatarsal amputation of foot, right Assessment & Plan: Patient had TMA for diabetic foot wound w/ cellulitis. Patient reports he's doing well, w/ pain well controlled. Wound look clean w/ minimal serosanguinous drainage on old bandage. Bandage changed in office. Patient has f/u w/ vascular surgery. Patient to take Abx until May 7th. -Continue Augmentin until 02/23/23 -F/u w/ Vascular surgery -F/u prn   Diabetes mellitus type 2 with peripheral artery disease Assessment & Plan: Patient reports taking his 28 units insulin daily but not checking his sugars. Told patient we would be sending a glucometer and he was to notify me if sugars higher than 200 continuously.  -CBG checks at home -28 units basaglar daily    Other orders -     Cetirizine HCl; Take 1 tablet (10 mg total) by mouth daily.  Dispense: 90 tablet; Refill: 3 -     traMADol HCl; Take 1 tablet (50 mg total) by mouth every  12 (twelve) hours as needed for severe pain.  Dispense: 30 tablet; Refill: 0 -     blood glucose meter kit and supplies; Dispense based on patient and insurance preference. Use up to four times daily as directed.  Dispense: 1 each; Refill: 0 -     Blood Glucose Monitoring Suppl; 1 each by Does not apply route in the morning, at noon, and at bedtime. May substitute to any manufacturer covered by patient's insurance.  Dispense: 1 each; Refill: 0 -     Blood Glucose Test; 1 each by In Vitro route in the morning, at noon, and at bedtime. May substitute to any manufacturer covered by patient's insurance.  Dispense: 90 each; Refill: 2 -     Lancet Device; 1 each by Does not apply route in the morning, at noon, and at  bedtime. May substitute to any manufacturer covered by patient's insurance.  Dispense: 1 each; Refill: 0 -     Lancets Misc.; 1 each by Does not apply route in the morning, at noon, and at bedtime. May substitute to any manufacturer covered by patient's insurance.  Dispense: 100 each; Refill: 0   No follow-ups on file. Bess Kinds, MD 01/31/2023, 7:38 AM PGY-2, [facility]

## 2023-01-29 NOTE — Patient Instructions (Addendum)
It was great to see you! Thank you for allowing me to participate in your care!  I recommend that you always bring your medications to each appointment as this makes it easy to ensure we are on the correct medications and helps [place] not miss when refills are needed.  Our plans for today:  Wound healing - Check sugars at home, notify me if they ar above 200 - Follow up with vascular surgery - Continue Augmentin for 4 wks - Checking blood work for kidney and liver function  Breathing issues - Make follow up appointment in 1 month to check on breathing status - Refill albuterol, use as needed  Compression fracture - Keep an eye on back pain, take pain medicine as needed  We are checking some labs today, I will call you if they are abnormal will send you a MyChart message or a letter if they are normal.  If you do not hear about your labs in the next 2 weeks please let us know.  Take care and seek immediate care sooner if you develop any concerns.   Dr. Bess Kinds, MD [facility]

## 2023-01-30 ENCOUNTER — Other Ambulatory Visit: Payer: Self-pay | Admitting: Student

## 2023-01-30 DIAGNOSIS — E1151 Type 2 diabetes mellitus with diabetic peripheral angiopathy without gangrene: Secondary | ICD-10-CM

## 2023-01-30 LAB — COMPREHENSIVE METABOLIC PANEL
ALT: 61 IU/L — ABNORMAL HIGH (ref 0–44)
AST: 25 IU/L (ref 0–40)
Albumin/Globulin Ratio: 1.2 (ref 1.2–2.2)
Albumin: 3.8 g/dL (ref 3.8–4.9)
Alkaline Phosphatase: 189 IU/L — ABNORMAL HIGH (ref 44–121)
BUN/Creatinine Ratio: 31 — ABNORMAL HIGH (ref 9–20)
BUN: 28 mg/dL — ABNORMAL HIGH (ref 6–24)
Bilirubin Total: 0.2 mg/dL (ref 0.0–1.2)
CO2: 25 mmol/L (ref 20–29)
Calcium: 9.2 mg/dL (ref 8.7–10.2)
Chloride: 97 mmol/L (ref 96–106)
Creatinine, Ser: 0.9 mg/dL (ref 0.76–1.27)
Globulin, Total: 3.1 g/dL (ref 1.5–4.5)
Glucose: 231 mg/dL — ABNORMAL HIGH (ref 70–99)
Potassium: 5.4 mmol/L — ABNORMAL HIGH (ref 3.5–5.2)
Sodium: 138 mmol/L (ref 134–144)
Total Protein: 6.9 g/dL (ref 6.0–8.5)
eGFR: 103 mL/min/{1.73_m2} (ref >59)

## 2023-01-31 ENCOUNTER — Other Ambulatory Visit: Payer: Self-pay | Admitting: Student

## 2023-01-31 ENCOUNTER — Telehealth: Payer: Self-pay | Admitting: Student

## 2023-01-31 DIAGNOSIS — Z89431 Acquired absence of right foot: Secondary | ICD-10-CM | POA: Insufficient documentation

## 2023-01-31 DIAGNOSIS — R0982 Postnasal drip: Secondary | ICD-10-CM

## 2023-01-31 DIAGNOSIS — R198 Other specified symptoms and signs involving the digestive system and abdomen: Secondary | ICD-10-CM

## 2023-01-31 NOTE — Telephone Encounter (Signed)
Called to inform patient that potassium is 5.4 and is slightly elevated. Wanting patient to go have K checked at urgent care. Normal range is under 5.2, so K is not very high, but patient still at risk and provider wanting his K checked/treated/EKG

## 2023-01-31 NOTE — Assessment & Plan Note (Signed)
Patient reports taking his 28 units insulin daily but not checking his sugars. Told patient we would be sending a glucometer and he was to notify me if sugars higher than 200 continuously.  -CBG checks at home -28 units basaglar daily

## 2023-01-31 NOTE — Telephone Encounter (Signed)
Called to inform patient that his potassium was slightly elevated and I want him to go have it checked. Potassium was 5.4, normal is under 5.2, but want to be sure potasium is not rising.

## 2023-01-31 NOTE — Assessment & Plan Note (Signed)
Patient had TMA for diabetic foot wound w/ cellulitis. Patient reports he's doing well, w/ pain well controlled. Wound look clean w/ minimal serosanguinous drainage on old bandage. Bandage changed in office. Patient has f/u w/ vascular surgery. Patient to take Abx until May 7th. -Continue Augmentin until 02/23/23 -F/u w/ Vascular surgery -F/u prn

## 2023-02-01 ENCOUNTER — Telehealth: Payer: Self-pay

## 2023-02-01 NOTE — Telephone Encounter (Signed)
Patient calls nurse line, returning call to Dr. Barbaraann Faster.   Patient is unable to come into office today and is requesting appointment for tomorrow. Scheduled for follow up in ATC tomorrow at 10:30.  Patient is asymptomatic at this time. Discussed red flags and ED precautions.   Patient verbalizes understanding.   Veronda Prude, RN

## 2023-02-01 NOTE — Telephone Encounter (Signed)
Thank you for getting him set up!

## 2023-02-01 NOTE — Telephone Encounter (Signed)
Pt called asking when he can drive.  Reviewed pt's chart, spoke to [facility], [place], returned call for clarification, two identifiers used. Relayed PA's advice about not driving until he has his f/u appt on 5/1. Knowing that he is trying to work, she stated that if he absolutely must drive, he needs to wear the Darco shoe and use extra padding on the foot to prevent any injury if he hits it against something. Instructed him to be extremely cautious since he may not be aware of where the brake and gas pedals are in relation to his foot. Impressed the importance of protecting his amputation. Confirmed understanding.

## 2023-02-02 ENCOUNTER — Ambulatory Visit (INDEPENDENT_AMBULATORY_CARE_PROVIDER_SITE_OTHER): Payer: Self-pay | Admitting: Student

## 2023-02-02 VITALS — BP 140/78 | HR 71 | Temp 98.2°F | Ht 68.0 in | Wt 204.4 lb

## 2023-02-02 DIAGNOSIS — E875 Hyperkalemia: Secondary | ICD-10-CM

## 2023-02-02 NOTE — Progress Notes (Signed)
    SUBJECTIVE:   CHIEF COMPLAINT / HPI: Elevated potassium follow-up  Labs collected on 4/12 in office showed a potassium of 5.4.  Patient is on combination pill of amlodipine-valsartan-HCTZ (5-160-12.5).  Says he was told that his antibiotic could cause hyperkalemia although currently he is on Augmentin.  Does not appear that he was on Bactrim or penicillin V recently.  Denies any palpitations, racing heart, shortness of breath, dizziness.  PERTINENT  PMH / PSH: recent hospitalization for diabetic foot infection  OBJECTIVE:   BP (!) 154/85   Pulse 71   Temp 98.2 F (36.8 C)   Ht  (1.727 m)   Wt 204 lb 6.4 oz (92.7 kg)   SpO2 100%   BMI 31.08 kg/m   General: Well appearing, NAD, awake, alert, responsive to questions Head: Normocephalic atraumatic CV: Regular rate and rhythm no murmurs rubs or gallops Respiratory: Clear to ausculation bilaterally, no wheezes rales or crackles, chest rises symmetrically,  no increased work of breathing Extremities: Boot in place R foot  ASSESSMENT/PLAN:   Hyperkalemia Mild hyperkalemia to 5.4.  He is asymptomatic.  Some medications that could be causing it most notable valsartan.  Discussed with patient that we will recheck this lab today and if more elevated than I would give him a Lokelma dose.  If stable or lower I would recommend monitoring.  If elevated, would consider decreasing valsartan component of combination pill. - Basic Metabolic Panel   Levin Erp, MD [facility]

## 2023-02-02 NOTE — Patient Instructions (Addendum)
It was great to see you! Thank you for allowing me to participate in your care!   Our plans for today:  -We will recheck your potassium level today -If it is more elevated I will give you a medicine to bring your potassium down -If it is elevated still we may need to adjust her blood pressure medication to lower a component that increases potassium (valsartan) -I will message our clinic director about PCP switch  Take care and seek immediate care sooner if you develop any concerns.  Levin Erp, MD

## 2023-02-03 ENCOUNTER — Telehealth: Payer: Self-pay | Admitting: Family Medicine

## 2023-02-03 ENCOUNTER — Telehealth: Payer: Self-pay | Admitting: Student

## 2023-02-03 LAB — BASIC METABOLIC PANEL
BUN/Creatinine Ratio: 21 — ABNORMAL HIGH (ref 9–20)
BUN: 20 mg/dL (ref 6–24)
CO2: 26 mmol/L (ref 20–29)
Calcium: 9.7 mg/dL (ref 8.7–10.2)
Chloride: 99 mmol/L (ref 96–106)
Creatinine, Ser: 0.95 mg/dL (ref 0.76–1.27)
Glucose: 180 mg/dL — ABNORMAL HIGH (ref 70–99)
Potassium: 6.2 mmol/L — ABNORMAL HIGH (ref 3.5–5.2)
Sodium: 139 mmol/L (ref 134–144)
eGFR: 96 mL/min/{1.73_m2} (ref >59)

## 2023-02-03 MED ORDER — LOKELMA 10 G PO PACK
10.0000 g | PACK | Freq: Once | ORAL | 0 refills | Status: AC
Start: 2023-02-03 — End: 2023-02-03

## 2023-02-03 NOTE — Addendum Note (Signed)
Addended by: Levin Erp on: 02/03/2023 09:16 AM   Modules accepted: Orders

## 2023-02-03 NOTE — Telephone Encounter (Signed)
The patient submitted a PCP request. I contacted him to debrief.  Call participants: Janit Pagan, Terisa Starr, and Tyrone Nine.  Per the patient, he met Dr. Roselee Culver and felt he would better fit for him regarding healthcare provision. He appreciated the care provided by Dr. Laroy Apple. I discussed our PCP switch policy with him. Per our policy, each patient is allowed one PCP switch request based on incompatibility. He agreed with the plan. PCP switch made.

## 2023-02-03 NOTE — Telephone Encounter (Signed)
Discussed with patient on phone that K high at 6.2. He denies any palpitations or chest pain. I discussed plan with attending Dr. McDiarmid - should come in for an EKG. Patient unable to come in today. Will have him take the lokelma and see me in clinic tomorrow. Will likely split the combination pill and decrease the ARB portion. I gave patient strict return precautions to go to ED and risk of arrhythmia with K elevations. Patient aware and amenable to this.

## 2023-02-03 NOTE — Telephone Encounter (Signed)
-----   Message from Bess Kinds, MD sent at 02/03/2023  1:41 PM EDT ----- Regarding: RE: Yup! I'm ok with this switch!  -BS ----- Message ----- From: Doreene Eland, MD Sent: 02/03/2023   1:34 PM EDT To: Levin Erp, MD; Bess Kinds, MD  Hello Apolinar Junes,  Are you ok with this switch? ----- Message ----- From: Levin Erp, MD Sent: 02/02/2023  10:44 AM EDT To: Doreene Eland, MD; Bess Kinds, MD  Patient discussed that he would like PCP switch to Dr. Barbaraann Faster.  Let me know if any issues.

## 2023-02-04 ENCOUNTER — Ambulatory Visit (INDEPENDENT_AMBULATORY_CARE_PROVIDER_SITE_OTHER): Payer: Self-pay | Admitting: Student

## 2023-02-04 ENCOUNTER — Ambulatory Visit (HOSPITAL_COMMUNITY)
Admission: RE | Admit: 2023-02-04 | Discharge: 2023-02-04 | Disposition: A | Discharge status: Home / Self Care | Payer: Self-pay | Source: Ambulatory Visit | Attending: Family Medicine | Admitting: Family Medicine

## 2023-02-04 VITALS — BP 138/75 | HR 72 | Ht 68.0 in | Wt 206.4 lb

## 2023-02-04 DIAGNOSIS — E875 Hyperkalemia: Secondary | ICD-10-CM

## 2023-02-04 DIAGNOSIS — I152 Hypertension secondary to endocrine disorders: Secondary | ICD-10-CM

## 2023-02-04 DIAGNOSIS — E1159 Type 2 diabetes mellitus with other circulatory complications: Secondary | ICD-10-CM

## 2023-02-04 DIAGNOSIS — I10 Essential (primary) hypertension: Secondary | ICD-10-CM

## 2023-02-04 MED ORDER — HYDROCHLOROTHIAZIDE 25 MG PO TABS
25.0000 mg | ORAL_TABLET | Freq: Every day | ORAL | 0 refills | Status: DC
Start: 2023-02-04 — End: 2023-02-05

## 2023-02-04 MED ORDER — AMLODIPINE BESYLATE 10 MG PO TABS
10.0000 mg | ORAL_TABLET | Freq: Every day | ORAL | 0 refills | Status: DC
Start: 2023-02-04 — End: 2023-02-05

## 2023-02-04 NOTE — Progress Notes (Signed)
    SUBJECTIVE:   CHIEF COMPLAINT / HPI: Hyperkalemia follow-up  Repeat potassium on 4/16 was 6.2.  I called the patient and gave him a dose of 10 g of Lokelma.  Denies any palpitations, dizziness, chest pain, syncope.  A little bit of sweating yesterday but resolved after dose of lokelma. Has been taking his combination BP pill.   PERTINENT  PMH / PSH: HTN, T2DM, TMA right foot  OBJECTIVE:   BP (!) 158/83   Pulse 77   Ht  (1.727 m)   Wt 206 lb 6.4 oz (93.6 kg)   SpO2 100%   BMI 31.38 kg/m   General: Well appearing, NAD, awake, alert, responsive to questions Head: Normocephalic atraumatic CV: Regular rate and rhythm no murmurs rubs or gallops Respiratory: Clear to ausculation bilaterally, no wheezes rales or crackles, chest rises symmetrically,  no increased work of breathing  ASSESSMENT/PLAN:   Hyperkalemia EKG today without any peaked T waves.  Patient is asymptomatic overall and took his Lokelma dose yesterday.  Will repeat labs today and if still elevated will give additional Lokelma dose.  Will stop his ARB today - EKG 12-Lead - Basic Metabolic Panel  Hypertension associated with diabetes Will switch his combination pill of amlodipine-HCTZ-valsartan to amlodipine and hydrochlorothiazide alone.  Will increase amlodipine to 10 mg and increase hydrochlorothiazide to 25 mg.  Patient will follow-up in 2 weeks with PCP for blood pressure recheck - amLODipine (NORVASC) 10 MG tablet; Take 1 tablet (10 mg total) by mouth at bedtime.  Dispense: 30 tablet; Refill: 0 - hydrochlorothiazide (HYDRODIURIL) 25 MG tablet; Take 1 tablet (25 mg total) by mouth daily.  Dispense: 30 tablet; Refill: 0    Levin Erp, MD [facility]

## 2023-02-04 NOTE — Patient Instructions (Addendum)
It was great to see you! Thank you for allowing me to participate in your care!   Our plans for today:  - I am changing your BP medicine to try and decrease the potassium in your serum-I am switching you to amlodipine 10 mg daily and hctz 25 mg daily - Your EKG showed no peaked T waves -BMP again today make sure your potassium is gone down after the lokelma-check follow-up with PCP in 2 weeks for blood pressure recheck and repeat labs  Take care and seek immediate care sooner if you develop any concerns.  Levin Erp, MD

## 2023-02-04 NOTE — Assessment & Plan Note (Signed)
Will switch his combination pill of amlodipine-HCTZ-valsartan to amlodipine and hydrochlorothiazide alone.  Will increase amlodipine to 10 mg and increase hydrochlorothiazide to 25 mg.  Patient will follow-up in 2 weeks with PCP for blood pressure recheck - amLODipine (NORVASC) 10 MG tablet; Take 1 tablet (10 mg total) by mouth at bedtime.  Dispense: 30 tablet; Refill: 0 - hydrochlorothiazide (HYDRODIURIL) 25 MG tablet; Take 1 tablet (25 mg total) by mouth daily.  Dispense: 30 tablet; Refill: 0

## 2023-02-05 ENCOUNTER — Other Ambulatory Visit: Payer: Self-pay

## 2023-02-05 DIAGNOSIS — I10 Essential (primary) hypertension: Secondary | ICD-10-CM

## 2023-02-05 LAB — BASIC METABOLIC PANEL
BUN/Creatinine Ratio: 22 — ABNORMAL HIGH (ref 9–20)
BUN: 20 mg/dL (ref 6–24)
CO2: 27 mmol/L (ref 20–29)
Calcium: 9.4 mg/dL (ref 8.7–10.2)
Chloride: 96 mmol/L (ref 96–106)
Creatinine, Ser: 0.9 mg/dL (ref 0.76–1.27)
Glucose: 195 mg/dL — ABNORMAL HIGH (ref 70–99)
Potassium: 5.1 mmol/L (ref 3.5–5.2)
Sodium: 137 mmol/L (ref 134–144)
eGFR: 103 mL/min/{1.73_m2} (ref >59)

## 2023-02-05 MED ORDER — HYDROCHLOROTHIAZIDE 25 MG PO TABS
25.0000 mg | ORAL_TABLET | Freq: Every day | ORAL | 0 refills | Status: DC
Start: 2023-02-05 — End: 2023-03-08

## 2023-02-05 MED ORDER — AMLODIPINE BESYLATE 10 MG PO TABS
10.0000 mg | ORAL_TABLET | Freq: Every day | ORAL | 0 refills | Status: DC
Start: 2023-02-05 — End: 2023-03-08

## 2023-02-05 NOTE — Telephone Encounter (Signed)
Patient calls nurse line regarding amlodipine and HCTZ prescription.   Patient reports that prescriptions were not sent to the correct pharmacy. He is requesting that prescriptions are transferred to [facility] on [address].   Called and canceled prescriptions at [facility]. Resent to [facility] on [address].   Veronda Prude, RN

## 2023-02-17 ENCOUNTER — Ambulatory Visit (INDEPENDENT_AMBULATORY_CARE_PROVIDER_SITE_OTHER): Payer: Self-pay | Admitting: Vascular Surgery

## 2023-02-17 ENCOUNTER — Encounter: Payer: Self-pay | Admitting: Vascular Surgery

## 2023-02-17 ENCOUNTER — Ambulatory Visit (INDEPENDENT_AMBULATORY_CARE_PROVIDER_SITE_OTHER): Payer: Self-pay | Admitting: Student

## 2023-02-17 ENCOUNTER — Encounter: Payer: Self-pay | Admitting: Student

## 2023-02-17 ENCOUNTER — Other Ambulatory Visit: Payer: Self-pay

## 2023-02-17 VITALS — BP 146/86 | HR 78 | Temp 97.9°F | Wt 206.0 lb

## 2023-02-17 VITALS — BP 134/78 | HR 79 | Ht 68.0 in | Wt 207.4 lb

## 2023-02-17 DIAGNOSIS — E1159 Type 2 diabetes mellitus with other circulatory complications: Secondary | ICD-10-CM

## 2023-02-17 DIAGNOSIS — E1151 Type 2 diabetes mellitus with diabetic peripheral angiopathy without gangrene: Secondary | ICD-10-CM

## 2023-02-17 DIAGNOSIS — Z89431 Acquired absence of right foot: Secondary | ICD-10-CM

## 2023-02-17 DIAGNOSIS — Z1211 Encounter for screening for malignant neoplasm of colon: Secondary | ICD-10-CM

## 2023-02-17 DIAGNOSIS — E875 Hyperkalemia: Secondary | ICD-10-CM

## 2023-02-17 DIAGNOSIS — I739 Peripheral vascular disease, unspecified: Secondary | ICD-10-CM

## 2023-02-17 DIAGNOSIS — I152 Hypertension secondary to endocrine disorders: Secondary | ICD-10-CM

## 2023-02-17 MED ORDER — BLOOD GLUCOSE MONITOR KIT: PACK | 0 refills | Status: DC

## 2023-02-17 NOTE — Progress Notes (Signed)
  SUBJECTIVE:   CHIEF COMPLAINT / HPI:   Hyperkalemia -Thought to be related to Valsartan, d/c at last visit -Given lokelma to lower K at home -Last K 5.1 02/04/23 -Recheck today  HTN -BP check, switched to HCTZ 25 mg, amlodipine 10 mg, d/c valsartan Meds: HCTZ 25 mg, amlodipine 10 mg BP at goal today.    DM Meds: 28 units basaglar daily, 8 units humalog w/ meals TID, sometimes missing lunch dose. CBG range: Not checking   Foot wound Foot is healing well, saw vasc doc today and got good repot. Sometimes pain is high, but usually well tolerated.   HM Tdap Colonoscopy  PERTINENT  PMH / PSH: HTN, DM   Patient Care Team: Bess Kinds, MD as PCP - General (Family Medicine) OBJECTIVE:  BP 134/78   Pulse 79   Ht 5\' 8"  (1.727 m)   Wt 207 lb 6.4 oz (94.1 kg)   SpO2 99%   BMI 31.54 kg/m  Physical Exam Constitutional:      General: He is not in acute distress.    Appearance: Normal appearance. He is not ill-appearing.  Cardiovascular:     Rate and Rhythm: Normal rate and regular rhythm.     Pulses: Normal pulses.     Heart sounds: Normal heart sounds. No murmur heard.    No friction rub. No gallop.  Pulmonary:     Effort: Pulmonary effort is normal. No respiratory distress.     Breath sounds: Normal breath sounds. No stridor. No wheezing, rhonchi or rales.  Neurological:     Mental Status: He is alert.      ASSESSMENT/PLAN:  Hyperkalemia Assessment & Plan: Patient noted to have hyperkalemia, that resolved 02/04/23. Was given lokelma to lower K at home. Etiology thought to be 2/2 to Valsartan, which was d/c. Will check K today, patient also w/ transaminitis on last blood work, will order CMP. -CMP  Orders: -     Comprehensive metabolic panel  Screening for colon cancer -     Ambulatory referral to Gastroenterology  Hypertension associated with diabetes ([name]) Assessment & Plan: BP well controlled since d/c of valsartan. Reports good compliance w/  meds. -continue HCTZ 25 mg, amlodipine 10 mg   Diabetes mellitus type 2 with peripheral artery disease ([name]) Assessment & Plan: Patient not checking sugars at home, but willing to start. Was unable to afford glucometer in past. Report's good compliance w/ DM regimen, but sometimes missing lunch dose as not eating lunch. Will have patient check sugars at home for 1 wk and message me results to determine need to adjust DM regimen. -28 units basaglar -8 units humalog TID w/ meals -BID CBG checks -Patient to message provider CBG ranges after 1 wk   S/P transmetatarsal amputation of foot, right [facility]) Assessment & Plan: Patient recently saw vascular surgery yesterday who noted amputation is healing well. He will continue to follow up with them. Pain usually well controlled but sometimes can be an issue if patient overdoes it w/ effort/activity on feet at work. -Continue to follow w/ Vasc Surg   Other orders -     blood glucose meter kit and supplies; Dispense based on patient and insurance preference. Use up to four times daily as directed.  Dispense: 1 each; Refill: 0   No follow-ups on file. Bess Kinds, MD 02/18/2023, 8:53 PM PGY-2, [facility]

## 2023-02-17 NOTE — Progress Notes (Signed)
     Subjective:     [name], male   DOB: 04/18/1971, 52 y.o.   MRN: [id number]  HPI 52 year old male follows up status post right transmetatarsal amputation with partial closure initially with wound VAC now doing wet-to-dry dressings.  States the wound is healing well without any complaints.  He remains in a heel weightbearing shoe and has not returned to work.   Review of Systems No complaints today    Objective:   Physical Exam Vitals:   02/17/23 0841  BP: (!) 146/86  Pulse: 78  Temp: 97.9 F (36.6 C)  SpO2: 97%   Awake alert oriented Strongly palpable right dorsalis pedis pulse Right TMA healing well     Assessment:     52 year old male status post right transmetatarsal amputation now healing well palpable dorsalis pedis pulse    Plan:     Dry dressing daily Completed antibiotics Follow-up 4 weeks for wound check    Naara Kelty C. Randie Heinz, MD [facility] [place] Office: [phone] Pager: [phone]

## 2023-02-17 NOTE — Patient Instructions (Signed)
It was great to see you! Thank you for allowing me to participate in your care!  I recommend that you always bring your medications to each appointment as this makes it easy to ensure we are on the correct medications and helps [place] not miss when refills are needed.  Our plans for today:  - Diabetes Check sugars first thing in morning, and 2 hours after evening meal for 1 week and message me on MyChart with the results.  If sugars in good range (110-140), we can meet in July If sugars above good range (110-140), we should meet this month to adjust insulin  - Blood Pressure Check blood press ure at home twice a day for 1 week, and message me with results.  - Hyperkalemia (High Potassium) We will check your Potassium today. If elevated, I'll send in another dose of lokelma  We are checking some labs today, I will call you if they are abnormal will send you a MyChart message or a letter if they are normal.  If you do not hear about your labs in the next 2 weeks please let us know.  Take care and seek immediate care sooner if you develop any concerns.   Dr. Bess Kinds, MD [facility]

## 2023-02-18 DIAGNOSIS — E875 Hyperkalemia: Secondary | ICD-10-CM | POA: Insufficient documentation

## 2023-02-18 LAB — COMPREHENSIVE METABOLIC PANEL
ALT: 20 IU/L (ref 0–44)
AST: 20 IU/L (ref 0–40)
Albumin/Globulin Ratio: 1.6 (ref 1.2–2.2)
Albumin: 4.3 g/dL (ref 3.8–4.9)
Alkaline Phosphatase: 163 IU/L — ABNORMAL HIGH (ref 44–121)
BUN/Creatinine Ratio: 25 — ABNORMAL HIGH (ref 9–20)
BUN: 27 mg/dL — ABNORMAL HIGH (ref 6–24)
Bilirubin Total: 0.4 mg/dL (ref 0.0–1.2)
CO2: 24 mmol/L (ref 20–29)
Calcium: 9.3 mg/dL (ref 8.7–10.2)
Chloride: 99 mmol/L (ref 96–106)
Creatinine, Ser: 1.08 mg/dL (ref 0.76–1.27)
Globulin, Total: 2.7 g/dL (ref 1.5–4.5)
Glucose: 265 mg/dL — ABNORMAL HIGH (ref 70–99)
Potassium: 4.9 mmol/L (ref 3.5–5.2)
Sodium: 139 mmol/L (ref 134–144)
Total Protein: 7 g/dL (ref 6.0–8.5)
eGFR: 83 mL/min/{1.73_m2} (ref >59)

## 2023-02-18 NOTE — Assessment & Plan Note (Signed)
BP well controlled since d/c of valsartan. Reports good compliance w/ meds. -continue HCTZ 25 mg, amlodipine 10 mg

## 2023-02-18 NOTE — Assessment & Plan Note (Signed)
Patient noted to have hyperkalemia, that resolved 02/04/23. Was given lokelma to lower K at home. Etiology thought to be 2/2 to Valsartan, which was d/c. Will check K today, patient also w/ transaminitis on last blood work, will order CMP. -CMP

## 2023-02-18 NOTE — Assessment & Plan Note (Addendum)
Patient recently saw vascular surgery yesterday who noted amputation is healing well. He will continue to follow up with them. Pain usually well controlled but sometimes can be an issue if patient overdoes it w/ effort/activity on feet at work. -Continue to follow w/ Vasc Surg

## 2023-02-18 NOTE — Assessment & Plan Note (Signed)
Patient not checking sugars at home, but willing to start. Was unable to afford glucometer in past. Report's good compliance w/ DM regimen, but sometimes missing lunch dose as not eating lunch. Will have patient check sugars at home for 1 wk and message me results to determine need to adjust DM regimen. -28 units basaglar -8 units humalog TID w/ meals -BID CBG checks -Patient to message provider CBG ranges after 1 wk

## 2023-03-01 ENCOUNTER — Telehealth: Payer: Self-pay

## 2023-03-01 NOTE — Telephone Encounter (Signed)
Pt called with concerns that his incision has opened.  Reviewed pt's chart, returned call for clarification, two identifiers used. Pt stated that he is moving and did more walking than he has been over the past weekend. There is one area near where the 2nd toe used to be that has some drainage. There is no odor, drainage is clear, he's been wearing his Darco shoe. Instructed pt to keep clean and dry, change gauze often, and monitor. If any S&S of infection begin, he will call the office for an appt. Pt preferred this option instead of coming in before his 4 week f/u. Confirmed understanding.

## 2023-03-03 ENCOUNTER — Telehealth: Payer: Self-pay

## 2023-03-03 NOTE — Telephone Encounter (Signed)
Returned call to pt - pt inquired about being able to drive and if he would have restrictions when he returns to work. Pt is s/p r foot transmet amp and washout. Spoke with Dr. Randie Heinz and he advised that he take frequent breaks during the day to rest foot but did not suggest any restrictions. He stated that he may drive as long as he did not have any concerns. Advised pt to contact our office with any additional questions or concerns. Pt verbalized understanding and agreed with this plan.

## 2023-03-06 ENCOUNTER — Other Ambulatory Visit: Payer: Self-pay | Admitting: Student

## 2023-03-06 DIAGNOSIS — E785 Hyperlipidemia, unspecified: Secondary | ICD-10-CM

## 2023-03-06 DIAGNOSIS — I10 Essential (primary) hypertension: Secondary | ICD-10-CM

## 2023-03-10 ENCOUNTER — Other Ambulatory Visit: Payer: Self-pay | Admitting: Student

## 2023-03-10 DIAGNOSIS — E1151 Type 2 diabetes mellitus with diabetic peripheral angiopathy without gangrene: Secondary | ICD-10-CM

## 2023-03-13 ENCOUNTER — Other Ambulatory Visit: Payer: Self-pay | Admitting: Student

## 2023-03-13 DIAGNOSIS — R0982 Postnasal drip: Secondary | ICD-10-CM

## 2023-03-13 DIAGNOSIS — R198 Other specified symptoms and signs involving the digestive system and abdomen: Secondary | ICD-10-CM

## 2023-03-16 NOTE — Progress Notes (Unsigned)
POST OPERATIVE OFFICE NOTE    CC:  F/u for surgery  HPI:  This is a 52 y.o. male who is s/p open right TMA on 01/19/2023 by Dr. Randie Heinz.     He subsequently underwent washout and partial closure of right TMA with open incisional wound vac placement on 01/22/2023 by Dr. Randie Heinz.   Pt was seen back on 02/17/2023 and his wound was healing.  He was non weightbearing and had not returned to work.   He had completed his abx and was scheduled for 4 week follow up.    Pt returns today for follow up.  Pt states his foot is doing good.  He does not have any pain in the foot.  Says the left foot is doing good.    Allergies  Allergen Reactions   Scallops [Shellfish Allergy] Anaphylaxis    Current Outpatient Medications  Medication Sig Dispense Refill   acetaminophen (TYLENOL) 325 MG tablet Take 2 tablets (650 mg total) by mouth every 6 (six) hours as needed for headache or mild pain.     albuterol (VENTOLIN HFA) 108 (90 Base) MCG/ACT inhaler Inhale 2 puffs into the lungs every 6 (six) hours as needed for wheezing or shortness of breath. INHALE 2 PUFFS BY MOUTH EVERY 6 HOURS AS NEEDED FOR WHEEZING OR SHORTNESS OF BREATH Strength: 108 (90 Base) MCG/ACT 8 g 0   amLODipine (NORVASC) 10 MG tablet TAKE 1 TABLET BY MOUTH ONCE DAILY AT BEDTIME 90 tablet 2   aspirin EC 81 MG tablet Take 1 tablet (81 mg total) by mouth daily. Swallow whole. 30 tablet 12   atorvastatin (LIPITOR) 40 MG tablet Take 1 tablet by mouth once daily 60 tablet 0   Blood Gluc Meter Disp-Strips (RELION ALL-IN-ONE) DEVI 1 each by Does not apply route 3 (three) times daily as needed. 1 each 3   blood glucose meter kit and supplies KIT Dispense based on patient and insurance preference. Use up to four times daily as directed. 1 each 0   Blood Glucose Monitoring Suppl DEVI 1 each by Does not apply route in the morning, at noon, and at bedtime. May substitute to any manufacturer covered by patient's insurance. 1 each 0    brompheniramine-pseudoephedrine-DM 30-2-10 MG/5ML syrup Take 5 mLs by mouth 4 (four) times daily as needed. 140 mL 0   cetirizine (ZYRTEC) 10 MG tablet Take 1 tablet (10 mg total) by mouth daily. 90 tablet 3   diphenhydrAMINE (BENADRYL) 25 MG tablet Take 25 mg by mouth at bedtime.     famotidine (PEPCID) 40 MG tablet Take 1 tablet by mouth twice daily 60 tablet 0   Glucose Blood (BLOOD GLUCOSE TEST STRIPS) STRP 1 each by In Vitro route in the morning, at noon, and at bedtime. May substitute to any manufacturer covered by patient's insurance. 90 each 2   hydrochlorothiazide (HYDRODIURIL) 25 MG tablet Take 1 tablet by mouth once daily 90 tablet 2   Insulin Glargine (BASAGLAR KWIKPEN) 100 UNIT/ML DIAL AND INJECT 28 UNITS UNDER THE SKIN EVERY MORNING. 15 mL 0   insulin lispro (HUMALOG) 100 UNIT/ML injection Inject 0.08 mLs (8 Units total) into the skin 3 (three) times daily before meals. 10 mL 2   montelukast (SINGULAIR) 10 MG tablet TAKE 1 TABLET BY MOUTH AT BEDTIME 30 tablet 0   Multiple Vitamin (MULTIVITAMIN) tablet Take 1 tablet by mouth daily.     ReliOn Lancets Thin 26G MISC 1 each by Does not apply route 3 (three) times daily as  needed. 1 each 3   traMADol (ULTRAM) 50 MG tablet Take 1 tablet (50 mg total) by mouth every 12 (twelve) hours as needed for severe pain. 30 tablet 0   ULTICARE INSULIN SYRINGE 30G X 1/2" 0.3 ML MISC See admin instructions.      No current facility-administered medications for this visit.     ROS:  See HPI  Physical Exam:  Today's Vitals   03/17/23 0845  BP: (!) 155/94  Pulse: 73  Temp: 98 F (36.7 C)  TempSrc: Temporal  SpO2: 95%  Weight: 212 lb (96.2 kg)   Body mass index is 32.23 kg/m.   Incision:  healing nicely  Extremities:  brisk right AT/PT/pero     Assessment/Plan:  This is a 52 y.o. male who is s/p: right TMA on 01/19/2023 by Dr. Randie Heinz.     He subsequently underwent washout and partial closure of right TMA with open incisional wound vac  placement on 01/22/2023 by Dr. Randie Heinz  -pt doing well and incision is healing nicely.  He has brisk doppler flow right foot. -pt seen with Dr. Randie Heinz and we will send to [employer] for shoe insert.   -pt can follow up with [facility] as needed. He knows to call if he has any issues.    Doreatha Massed, [facility] [phone]   Clinic MD:  Randie Heinz

## 2023-03-17 ENCOUNTER — Ambulatory Visit (INDEPENDENT_AMBULATORY_CARE_PROVIDER_SITE_OTHER): Payer: Self-pay | Admitting: Physician Assistant

## 2023-03-17 ENCOUNTER — Other Ambulatory Visit: Payer: Self-pay | Admitting: Family Medicine

## 2023-03-17 VITALS — BP 155/94 | HR 73 | Temp 98.0°F | Wt 212.0 lb

## 2023-03-17 DIAGNOSIS — Z89431 Acquired absence of right foot: Secondary | ICD-10-CM

## 2023-03-18 MED ORDER — ACETAMINOPHEN 325 MG PO TABS: 650.0000 mg | ORAL_TABLET | Freq: Four times a day (QID) | ORAL | 2 refills | Status: DC | PRN

## 2023-03-29 NOTE — Progress Notes (Deleted)
  SUBJECTIVE:   CHIEF COMPLAINT / HPI:   F/u DM Meds: 28 units basaglar daily, 8 units humalog w/ meals TID   HTN Meds: HCTZ 25 mg, amlodipine 10 mg   PERTINENT  PMH / PSH: ***  Past Medical History:  Diagnosis Date   Blurry vision 07/10/2020   Diabetes mellitus without complication ([name])    Gangrene of toe of left foot ([name])    Sepsis due to Streptococcus, group B ([name]) 06/20/2020   Streptococcal bacteremia     Patient Care Team: Bess Kinds, MD as PCP - General (Family Medicine) OBJECTIVE:  There were no vitals taken for this visit. Physical Exam   ASSESSMENT/PLAN:  There are no diagnoses linked to this encounter. No follow-ups on file. Bess Kinds, MD 03/29/2023, 6:28 PM PGY-***, [facility] {    This will disappear when note is signed, click to select method of visit    :1}

## 2023-03-30 ENCOUNTER — Ambulatory Visit: Payer: Self-pay | Admitting: Student

## 2023-04-04 NOTE — Progress Notes (Deleted)
  SUBJECTIVE:   CHIEF COMPLAINT / HPI:   DM check up Meds: -28 units basaglar -8 units humalog TID w/ meals CBG range:   HTN Meds:   HCTZ 25 mg, amlodipine 10 mg     PERTINENT  PMH / PSH: ***  Past Medical History:  Diagnosis Date   Blurry vision 07/10/2020   Diabetes mellitus without complication ([name])    Gangrene of toe of left foot ([name])    Sepsis due to Streptococcus, group B ([name]) 06/20/2020   Streptococcal bacteremia     Patient Care Team: Bess Kinds, MD as PCP - General (Family Medicine) OBJECTIVE:  There were no vitals taken for this visit. Physical Exam   ASSESSMENT/PLAN:  There are no diagnoses linked to this encounter. No follow-ups on file. Bess Kinds, MD 04/04/2023, 6:27 PM PGY-***, [facility] {    This will disappear when note is signed, click to select method of visit    :1}

## 2023-04-05 ENCOUNTER — Ambulatory Visit: Payer: Self-pay | Admitting: Student

## 2023-04-06 ENCOUNTER — Other Ambulatory Visit: Payer: Self-pay | Admitting: Student

## 2023-04-06 DIAGNOSIS — E1151 Type 2 diabetes mellitus with diabetic peripheral angiopathy without gangrene: Secondary | ICD-10-CM

## 2023-04-13 ENCOUNTER — Telehealth: Payer: Self-pay | Admitting: Student

## 2023-04-13 NOTE — Telephone Encounter (Signed)
Called to speak with patient about diabetes and hypertension management.  Patient was to send provider glucose log, 1 to 2 weeks after last visit.  Provider is call patient to follow-up on glucose monitoring, patient recently moved, and was unable to find CGM at that time.  Patient was also unable to measure blood pressure at that time as they could not find BP cuff.  It is now a couple months post original visit, provider calling patient to reestablish care.  Patient has rescheduled follow-up appointments x 2.  Provider concerned about patient's diabetes regimen and blood pressure, wanting patient to come in to help manage his medical issues.  Will attempt to call patient back at another time.  Have left a message asking patient to call back the clinic, hoping he will receive this message

## 2023-04-19 ENCOUNTER — Other Ambulatory Visit (HOSPITAL_COMMUNITY): Payer: Self-pay

## 2023-04-20 ENCOUNTER — Telehealth: Payer: Self-pay

## 2023-04-20 DIAGNOSIS — E1151 Type 2 diabetes mellitus with diabetic peripheral angiopathy without gangrene: Secondary | ICD-10-CM

## 2023-04-20 NOTE — Telephone Encounter (Signed)
Mailed Consulting civil [employer] for humalog and basaglar medication assistance.

## 2023-05-03 ENCOUNTER — Ambulatory Visit: Payer: Self-pay

## 2023-05-03 ENCOUNTER — Ambulatory Visit: Payer: Self-pay | Admitting: Student

## 2023-05-05 ENCOUNTER — Other Ambulatory Visit: Payer: Self-pay | Admitting: Student

## 2023-05-05 DIAGNOSIS — E785 Hyperlipidemia, unspecified: Secondary | ICD-10-CM

## 2023-06-14 ENCOUNTER — Telehealth: Payer: Self-pay

## 2023-06-14 NOTE — Telephone Encounter (Signed)
Pt called c/o leg swelling with standing for long hours at work.  Reviewed pt's chart, returned call for clarification, two identifiers used. 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 Advised him to elevate as much as possible after work and at [employer]. Instructed him to come into office to be fitted for compression stockings which would benefit him and help with the swelling. Pt plans on doing this soon. Confirmed understanding.

## 2023-06-15 NOTE — Telephone Encounter (Signed)
Patient completed re-enrollment online.   Provider was out of office when [employer] reached out for provider pages. Will print and get them to pcp asap. Pt aware.

## 2023-06-17 DIAGNOSIS — M7989 Other specified soft tissue disorders: Secondary | ICD-10-CM

## 2023-06-18 ENCOUNTER — Ambulatory Visit: Payer: Self-pay | Admitting: Student

## 2023-06-22 NOTE — Telephone Encounter (Signed)
Faxed rx pages to [employer]

## 2023-06-23 ENCOUNTER — Telehealth: Payer: Self-pay

## 2023-06-23 NOTE — Telephone Encounter (Signed)
Pt called with lots of frustration. He states that he works and he "can't keep coming in and getting more taken off of his foot with problem after problem."  Reviewed pt's chart, returned call for clarification, two identifiers used. Pt c/o new blister on his foot that has been bleeding. He still doesn't have his shoe insert because [facility] states that they do not have the item available. He states that otherwise, he's doing well. The compression socks that he purchased last week feel "awesome." Since he doesn't have any other symptoms at this time, an office visit and/or labs did not seem warranted. It seems that his foot is rubbing on his shoe without a shoe insert. Instructed him to pack his shoe with gauze or a soft sock. Informed him that this office would reach out to [facility] to find out about the shoe insert and call him back. Confirmed understanding.

## 2023-06-25 ENCOUNTER — Other Ambulatory Visit: Payer: Self-pay

## 2023-06-25 ENCOUNTER — Telehealth: Payer: Self-pay

## 2023-06-25 ENCOUNTER — Encounter: Payer: Self-pay | Admitting: Student

## 2023-06-25 ENCOUNTER — Ambulatory Visit (INDEPENDENT_AMBULATORY_CARE_PROVIDER_SITE_OTHER): Payer: Self-pay | Admitting: Student

## 2023-06-25 VITALS — BP 119/75 | HR 83 | Ht 68.0 in | Wt 203.4 lb

## 2023-06-25 DIAGNOSIS — G2581 Restless legs syndrome: Secondary | ICD-10-CM

## 2023-06-25 DIAGNOSIS — E1151 Type 2 diabetes mellitus with diabetic peripheral angiopathy without gangrene: Secondary | ICD-10-CM

## 2023-06-25 DIAGNOSIS — S91309A Unspecified open wound, unspecified foot, initial encounter: Secondary | ICD-10-CM

## 2023-06-25 LAB — POCT GLYCOSYLATED HEMOGLOBIN (HGB A1C): HbA1c, POC (controlled diabetic range): 8.4 % — AB (ref 0.0–7.0)

## 2023-06-25 MED ORDER — ROPINIROLE HCL 1 MG PO TABS: 1.0000 mg | ORAL_TABLET | Freq: Every day | ORAL | 5 refills | Status: DC

## 2023-06-25 NOTE — Assessment & Plan Note (Signed)
Patient comes in for follow-up of diabetes.  Patient reports good compliance with medication.  Patient A1c elevated to 8.4 today, from 7.2 in March.  Patient notes he eats a diet heavy in carbs, eating multiple servings of carbs and 1 sitting.  Discussed proportions and cutting back on carbs.  Will also adjust insulin.  Patient also requesting Dexcom's, will reach out to Dr. Hildred Laser to see if patient can receive. - Basaglar increased to 33 units daily - Continue 8 units Humalog 3 times daily - Follow-up 3 months (we will follow-up sooner if patient able to monitor CBGs) - Consult Dr. Hildred Laser about Dexcom's

## 2023-06-25 NOTE — Telephone Encounter (Signed)
Pt called stating that his foot is still bleeding and requested an appt.  Reviewed pt's chart, returned call for clarification, two identifiers used. Informed him that Larita Fife had spoken to [employer].  In May, pt wanted a shoe insert for his L foot in addition to the R foot. [facility] told him he would have to get a referral from his PCP and he would have to pay OOP for it. The R shoe insert would be covered d/t the amputation [facility] did. [facility] stated that the pt was not willing to do that. Regardless, the pt is now past 90 days post-op and needs a f/u visit for the R shoe insert.  Pt stated that the blistered area is intermittently bleeding. Instructed him to keep the area protected, clean, and dry to prevent further injury from rubbing his shoe. He stated that he is doing that. Appt scheduled for 9/12 on a Dr. Randie Heinz office day. Instructed him to call if any concerns before that date. Confirmed understanding.

## 2023-06-25 NOTE — Progress Notes (Signed)
SUBJECTIVE:   CHIEF COMPLAINT / HPI:   Under the weather: Has been feeling ill for last week, taking dayquil. Note's he's tired, cough and wheezing. Had a fever, but it broke, and is now feeling better some. Still has cough in chest.  Patient also notes he has a wound on his foot however patient's wife wrapped it this morning and felt like it was not infected, as it did not have an odor and was not draining.  Patient and patient's wife believe they can identify when wound is infected, as this is been an issue in the past.  Restless legs Usually takes ropinarol 1 mg at night. Usually has good relief. Has run out of meds, and they are bothering him a lot at night.  DM Meds:28 units basaglar daily, 8 units humalog w/ meals TID  Lab Results  Component Value Date   HGBA1C 8.4 (A) 06/25/2023  Is taking meds regularly.  CBG: not checking at home, but is wanting a dexcom Notes he's been eating a lot of carbs.     PERTINENT  PMH / PSH:   OBJECTIVE:  BP 119/75   Pulse 83   Ht 5\' 8"  (1.727 m)   Wt 203 lb 6.4 oz (92.3 kg)   SpO2 99%   BMI 30.93 kg/m  Physical Exam Constitutional:      General: He is not in acute distress.    Appearance: Normal appearance. He is not ill-appearing.  Cardiovascular:     Rate and Rhythm: Normal rate and regular rhythm.     Pulses: Normal pulses.     Heart sounds: Normal heart sounds. No murmur heard.    No friction rub. No gallop.  Pulmonary:     Effort: Pulmonary effort is normal. No respiratory distress.     Breath sounds: Normal breath sounds. No stridor. No wheezing, rhonchi or rales.  Abdominal:     General: Bowel sounds are normal. There is no distension.     Palpations: Abdomen is soft. There is no mass.     Tenderness: There is no abdominal tenderness.  Skin:    Capillary Refill: Capillary refill takes less than 2 seconds.  Neurological:     Mental Status: He is alert.  Psychiatric:        Mood and Affect: Mood normal.        Behavior:  Behavior normal.      ASSESSMENT/PLAN:  Diabetes mellitus type 2 with peripheral artery disease ([name]) Assessment & Plan: Patient comes in for follow-up of diabetes.  Patient reports good compliance with medication.  Patient A1c elevated to 8.4 today, from 7.2 in March.  Patient notes he eats a diet heavy in carbs, eating multiple servings of carbs and 1 sitting.  Discussed proportions and cutting back on carbs.  Will also adjust insulin.  Patient also requesting Dexcom's, will reach out to Dr. Hildred Laser to see if patient can receive. - Basaglar increased to 33 units daily - Continue 8 units Humalog 3 times daily - Follow-up 3 months (we will follow-up sooner if patient able to monitor CBGs) - Consult Dr. Hildred Laser about Dexcom's  Orders: -     POCT glycosylated hemoglobin (Hb A1C)  Restless legs syndrome Assessment & Plan: Patient reports history of restless legs, notes that his legs have been more bothersome recently.  Patient notes he has been out of his medicine Requip.  Patient request refill. - Refill Requip 1 mg nightly   Open wound of plantar aspect of foot, unspecified laterality,  initial encounter Assessment & Plan: Patient comes in with blister on foot however did not want foot examined in clinic today.  Patient denies any fevers, or systemic symptoms, but has been feeling under the weather more recently.  Patient reports he has been feeling improved from his recent sickness which started a week ago.  Patient denies any worsening foot pain, drainage, smell from foot ulcer.  Patient has appointment with podiatry on Tuesday.  Patient plans to take a picture at home and send to provider, provider will determine if patient needs to follow-up in ED.  Patient did not want to undress foot today in clinic, but was willing to do so at home. - Plan to receive photo of wound from patient - Plan to determine if patient needs to follow-up in the ED, pending photos - Return precautions for fevers,  worsening pain, foot drainage, malaise, body aches, go to the ED   Other orders -     rOPINIRole HCl; Take 1 tablet (1 mg total) by mouth at bedtime.  Dispense: 30 tablet; Refill: 5   No follow-ups on file. Bess Kinds, MD 06/25/2023, 5:28 PM PGY-3, [facility]

## 2023-06-25 NOTE — Assessment & Plan Note (Signed)
Patient comes in with blister on foot however did not want foot examined in clinic today.  Patient denies any fevers, or systemic symptoms, but has been feeling under the weather more recently.  Patient reports he has been feeling improved from his recent sickness which started a week ago.  Patient denies any worsening foot pain, drainage, smell from foot ulcer.  Patient has appointment with podiatry on Tuesday.  Patient plans to take a picture at home and send to provider, provider will determine if patient needs to follow-up in ED.  Patient did not want to undress foot today in clinic, but was willing to do so at home. - Plan to receive photo of wound from patient - Plan to determine if patient needs to follow-up in the ED, pending photos - Return precautions for fevers, worsening pain, foot drainage, malaise, body aches, go to the ED

## 2023-06-25 NOTE — Patient Instructions (Addendum)
It was great to see you! Thank you for allowing me to participate in your care!  I recommend that you always bring your medications to each appointment as this makes it easy to ensure we are on the correct medications and helps [place] not miss when refills are needed.  Our plans for today:  - Foot Infection  Send me a photo through my chart, I will direct you to the ED if need. If you develop fever, worsening body aches, worsening chills, worsening head ache, or worsening foot pain/swelling/drainage  Go to Emergency Department Follow up with Podiatry on Tuesday  - Restless legs  Refill ropinirole 1 mg, to be taken before bed  - Diabetes  I will reach out to Dr. Raymondo Band about [facility]  Consider applying for Medicaid  Increase Basaglar to 33 units daily  Continue Humalog at 8 units a meal, three times a day/with meals Follow up appointment in 3 months (Will see you sooner if we can get the [facility]) Call about refilling insulin Let me know what I can do to help!    Take care and seek immediate care sooner if you develop any concerns.   Dr. Bess Kinds, MD [facility]

## 2023-06-25 NOTE — Assessment & Plan Note (Signed)
Patient reports history of restless legs, notes that his legs have been more bothersome recently.  Patient notes he has been out of his medicine Requip.  Patient request refill. - Refill Requip 1 mg nightly

## 2023-06-25 NOTE — Telephone Encounter (Signed)
Application pending. Sean Carter could not find patients online application. Reached out to pt for the WebID or pt can call himself to complete any missing info.

## 2023-06-26 ENCOUNTER — Encounter: Payer: Self-pay | Admitting: Vascular Surgery

## 2023-06-26 ENCOUNTER — Encounter: Payer: Self-pay | Admitting: Student

## 2023-06-28 ENCOUNTER — Telehealth: Payer: Self-pay | Admitting: Pharmacist

## 2023-06-28 ENCOUNTER — Telehealth: Payer: Self-pay | Admitting: Student

## 2023-06-28 NOTE — Telephone Encounter (Signed)
-----   Message from Bess Kinds sent at 06/25/2023  5:29 PM EDT ----- Regarding: Lenore Manner Dr. Hildred Laser, this patient was seen in clinic today, and was requesting Dexcom's.  I am not sure what it takes to get these, but I told the patient I would speak to you about trying to get CGM's for him.    Is there any way to get CGM's for this patient?  They are already on insulin, and uninsured.  Thank you for your help!  - BS

## 2023-06-28 NOTE — Telephone Encounter (Signed)
-----   Message from Bess Kinds sent at 06/25/2023  5:29 PM EDT ----- Regarding: Sean Carter Dr. Hildred Laser, this patient was seen in clinic today, and was requesting Dexcom's.  I am not sure what it takes to get these, but I told the patient I would speak to you about trying to get CGM's for him.    Is there any way to get CGM's for this patient?  They are already on insulin, and uninsured.  Thank you for your help!  - BS

## 2023-06-28 NOTE — Telephone Encounter (Signed)
Attempted to contact patient for follow-up of CGM request   Left HIPAA compliant voice mail requesting call back to direct phone: [phone]  Total time with patient call and documentation of interaction: 9 minutes.

## 2023-06-28 NOTE — Telephone Encounter (Signed)
Patient returned call.   Appears he will have [employer] in 2-3 weeks.   Plan to start him on Dexcom G7 app with use of his phone  Appt. Scheduled in 3 days 9/12

## 2023-06-28 NOTE — Telephone Encounter (Signed)
Spoke with patient about foot/picture he sent. He notes that he is feeling well, and has been feeling better since our appointment. He notes he got zapped of energy today, but he doesn't drink water, and thinks this may be contributing. He reports his foot is doing better and looks good/ better than it did in the picture, with no black spot, but a little bit of grey.   I told patient that I could not assess how his foot was actually doing and that the safest thing would be to go the ED. He notes he has an appointment with podiatry tomorrow in the afternoon and feels fine to wait. I asked that he discuss with his wife and make a final determination, but my official recommendation, was for him to go be seen.   Patient understood and was appreciative.   Also discussed that patient had spoken with Dr. Raymondo Band and was going to meet with him on Thursday to get CGM's for a month, followed by it kicking in with his insurance.   He disclosed that his diet was pretty poor and he drinks a case of coke zero's a day. I asked that he make an appointment with Dr. Danie Chandler on diabetes/diet counseling. Patient was in agreement.   Provider to message patient with Dr. Gerilyn Pilgrim phone contact.

## 2023-07-01 ENCOUNTER — Ambulatory Visit (INDEPENDENT_AMBULATORY_CARE_PROVIDER_SITE_OTHER): Payer: Self-pay | Admitting: Pharmacist

## 2023-07-01 ENCOUNTER — Ambulatory Visit (INDEPENDENT_AMBULATORY_CARE_PROVIDER_SITE_OTHER): Payer: Self-pay | Admitting: Physician Assistant

## 2023-07-01 ENCOUNTER — Encounter: Payer: Self-pay | Admitting: Pharmacist

## 2023-07-01 VITALS — BP 135/99 | HR 87 | Temp 98.1°F | Resp 18 | Ht 68.0 in | Wt 203.0 lb

## 2023-07-01 VITALS — BP 134/78 | HR 73 | Ht 68.0 in | Wt 203.0 lb

## 2023-07-01 DIAGNOSIS — E1151 Type 2 diabetes mellitus with diabetic peripheral angiopathy without gangrene: Secondary | ICD-10-CM

## 2023-07-01 DIAGNOSIS — L97519 Non-pressure chronic ulcer of other part of right foot with unspecified severity: Secondary | ICD-10-CM

## 2023-07-01 DIAGNOSIS — Z89431 Acquired absence of right foot: Secondary | ICD-10-CM

## 2023-07-01 MED ORDER — BASAGLAR KWIKPEN 100 UNIT/ML ~~LOC~~ SOPN
33.0000 [IU] | PEN_INJECTOR | Freq: Every day | SUBCUTANEOUS | Status: DC
Start: 2023-07-01 — End: 2023-07-13

## 2023-07-01 MED ORDER — SULFAMETHOXAZOLE-TRIMETHOPRIM 800-160 MG PO TABS: 1.0000 | ORAL_TABLET | Freq: Two times a day (BID) | ORAL | 0 refills | Status: AC

## 2023-07-01 NOTE — Patient Instructions (Signed)
Hope you enjoy your CGM!

## 2023-07-01 NOTE — Progress Notes (Signed)
Office Note     CC:  follow up Requesting Provider:  Bess Kinds, MD  HPI: Sean Carter is a 52 y.o. ([birth year]) male who presents for evaluation of new wound on the right foot.  He underwent an open right TMA on 01/19/2023 by Dr. Randie Heinz.  He subsequently underwent washout and partial closure with VAC placement on 01/22/2023.  He lost his prior job due to being out of work related to surgery and has been working at [employer] as a Investment banker, operational.  Over the past week he noticed an ulcer starting on his lateral dorsal right foot.  He states there is an order to it.  He states he has been taking an antibiotic prescribed to his girlfriend after wisdom teeth extraction.  He believes the ulcer is slightly improving.  He denies any fevers, chills, nausea/vomiting.  He denies tobacco use.  Recent hemoglobin A1c was 8.4.   Past Medical History:  Diagnosis Date   Blurry vision 07/10/2020   Diabetes mellitus without complication ([name])    Gangrene of toe of left foot ([name])    Sepsis due to Streptococcus, group B ([name]) 06/20/2020   Streptococcal bacteremia     Past Surgical History:  Procedure Laterality Date   ABDOMINAL AORTOGRAM W/LOWER EXTREMITY N/A 01/18/2023   Procedure: ABDOMINAL AORTOGRAM W/LOWER EXTREMITY;  Surgeon: Chuck Hint, MD;  Location: [facility] INVASIVE CV LAB;  Service: Cardiovascular;  Laterality: N/A;   AMPUTATION Left 05/31/2020   Procedure: Ray Amputation of Left Great Toe with Negative Pressure Vac Placement;  Surgeon: Cephus Shelling, MD;  Location: [facility] OR;  Service: Vascular;  Laterality: Left;   AMPUTATION Right 07/22/2022   Procedure: RIGHT FIRST AND SECOND RAY PARTIAL  AMPUTATION;  Surgeon: Leonie Douglas, MD;  Location: [facility] OR;  Service: Vascular;  Laterality: Right;   APPLICATION OF WOUND VAC Right 01/22/2023   Procedure: APPLICATION OF WOUND VAC, RIGHT FOOT;  Surgeon: Maeola Harman, MD;  Location: [facility] OR;  Service: Vascular;  Laterality: Right;   INCISION AND  DRAINAGE OF WOUND Right 01/22/2023   Procedure: WASHOUT OF RIGHT FOOT WOUND;  Surgeon: Maeola Harman, MD;  Location: [facility] OR;  Service: Vascular;  Laterality: Right;   PERIPHERAL VASCULAR BALLOON ANGIOPLASTY  01/18/2023   Procedure: PERIPHERAL VASCULAR BALLOON ANGIOPLASTY;  Surgeon: Chuck Hint, MD;  Location: [facility] INVASIVE CV LAB;  Service: Cardiovascular;;   STUMP REVISION Right 01/22/2023   Procedure: PARTIAL CLOSURE OF RIGHT FOOT WOUND;  Surgeon: Maeola Harman, MD;  Location: [facility] OR;  Service: Vascular;  Laterality: Right;   TRANSMETATARSAL AMPUTATION Right 01/19/2023   Procedure: TRANSMETATARSAL AMPUTATION;  Surgeon: Maeola Harman, MD;  Location: [facility] OR;  Service: Vascular;  Laterality: Right;    Social History   Socioeconomic History   Marital status: Married    Spouse name: Not on file   Number of children: Not on file   Years of education: Not on file   Highest education level: 12th grade  Occupational History   Not on file  Tobacco Use   Smoking status: Never    Passive exposure: Never   Smokeless tobacco: Never  Vaping Use   Vaping status: Never Used  Substance and Sexual Activity   Alcohol use: Not on file   Drug use: Never   Sexual activity: Not on file  Other Topics Concern   Not on file  Social History Narrative   Not on file   Social Determinants of Health   Financial  Resource Strain: Medium Risk (01/28/2023)   Overall Financial Resource Strain (CARDIA)    Difficulty of Paying Living Expenses: Somewhat hard  Food Insecurity: No Food Insecurity (01/28/2023)   Hunger Vital Sign    Worried About Running Out of Food in the Last Year: Never true    Ran Out of Food in the Last Year: Never true  Transportation Needs: No Transportation Needs (01/28/2023)   PRAPARE - Administrator, Civil Service (Medical): No    Lack of Transportation (Non-Medical): No  Physical Activity: Unknown (01/28/2023)   Exercise Vital Sign     Days of Exercise per Week: 6 days    Minutes of Exercise per Session: Patient declined  Stress: Patient Declined (01/28/2023)   [employer] of Occupational Health - Occupational Stress Questionnaire    Feeling of Stress : Patient declined  Social Connections: Moderately Integrated (01/28/2023)   Social Connection and Isolation Panel [NHANES]    Frequency of Communication with Friends and Family: More than three times a week    Frequency of Social Gatherings with Friends and Family: Once a week    Attends Religious Services: More than 4 times per year    Active Member of [employer] or Organizations: No    Attends Banker Meetings: Not on file    Marital Status: Married  Catering manager Violence: Not At Risk (01/18/2023)   Humiliation, Afraid, Rape, and Kick questionnaire    Fear of Current or Ex-Partner: No    Emotionally Abused: No    Physically Abused: No    Sexually Abused: No   History reviewed. No pertinent family history.  Current Outpatient Medications  Medication Sig Dispense Refill   sulfamethoxazole-trimethoprim (BACTRIM DS) 800-160 MG tablet Take 1 tablet by mouth 2 (two) times daily for 14 days. 28 tablet 0   acetaminophen (TYLENOL) 325 MG tablet Take 2 tablets (650 mg total) by mouth every 6 (six) hours as needed for headache or mild pain. 90 tablet 2   albuterol (VENTOLIN HFA) 108 (90 Base) MCG/ACT inhaler Inhale 2 puffs into the lungs every 6 (six) hours as needed for wheezing or shortness of breath. INHALE 2 PUFFS BY MOUTH EVERY 6 HOURS AS NEEDED FOR WHEEZING OR SHORTNESS OF BREATH Strength: 108 (90 Base) MCG/ACT 8 g 0   amLODipine (NORVASC) 10 MG tablet TAKE 1 TABLET BY MOUTH ONCE DAILY AT BEDTIME 90 tablet 2   aspirin EC 81 MG tablet Take 1 tablet (81 mg total) by mouth daily. Swallow whole. 30 tablet 12   atorvastatin (LIPITOR) 40 MG tablet Take 1 tablet by mouth once daily 90 tablet 1   Blood Gluc Meter Disp-Strips (RELION ALL-IN-ONE) DEVI 1 each by Does  not apply route 3 (three) times daily as needed. 1 each 3   blood glucose meter kit and supplies KIT Dispense based on patient and insurance preference. Use up to four times daily as directed. 1 each 0   Blood Glucose Monitoring Suppl DEVI 1 each by Does not apply route in the morning, at noon, and at bedtime. May substitute to any manufacturer covered by patient's insurance. 1 each 0   brompheniramine-pseudoephedrine-DM 30-2-10 MG/5ML syrup Take 5 mLs by mouth 4 (four) times daily as needed. 140 mL 0   cetirizine (ZYRTEC) 10 MG tablet Take 1 tablet (10 mg total) by mouth daily. 90 tablet 3   diphenhydrAMINE (BENADRYL) 25 MG tablet Take 25 mg by mouth at bedtime.     famotidine (PEPCID) 40 MG tablet Take  1 tablet by mouth twice daily 60 tablet 3   hydrochlorothiazide (HYDRODIURIL) 25 MG tablet Take 1 tablet by mouth once daily 90 tablet 2   Insulin Glargine (BASAGLAR KWIKPEN) 100 UNIT/ML DIAL AND INJECT 28 UNITS UNDER THE SKIN EVERY MORNING. 15 mL 3   insulin lispro (HUMALOG) 100 UNIT/ML injection Inject 0.08 mLs (8 Units total) into the skin 3 (three) times daily before meals. 10 mL 2   montelukast (SINGULAIR) 10 MG tablet TAKE 1 TABLET BY MOUTH AT BEDTIME 90 tablet 1   Multiple Vitamin (MULTIVITAMIN) tablet Take 1 tablet by mouth daily.     ReliOn Lancets Thin 26G MISC 1 each by Does not apply route 3 (three) times daily as needed. 1 each 3   rOPINIRole (REQUIP) 1 MG tablet Take 1 tablet (1 mg total) by mouth at bedtime. 30 tablet 5   traMADol (ULTRAM) 50 MG tablet Take 1 tablet (50 mg total) by mouth every 12 (twelve) hours as needed for severe pain. 30 tablet 0   ULTICARE INSULIN SYRINGE 30G X 1/2" 0.3 ML MISC See admin instructions.      No current facility-administered medications for this visit.    Allergies  Allergen Reactions   Scallops [Shellfish Allergy] Anaphylaxis     REVIEW OF SYSTEMS:   [X]  denotes positive finding, [ ]  denotes negative finding Cardiac  Comments:  Chest  pain or chest pressure:    Shortness of breath upon exertion:    Short of breath when lying flat:    Irregular heart rhythm:        Vascular    Pain in calf, thigh, or hip brought on by ambulation:    Pain in feet at night that wakes you up from your sleep:     Blood clot in your veins:    Leg swelling:         Pulmonary    Oxygen at home:    Productive cough:     Wheezing:         Neurologic    Sudden weakness in arms or legs:     Sudden numbness in arms or legs:     Sudden onset of difficulty speaking or slurred speech:    Temporary loss of vision in one eye:     Problems with dizziness:         Gastrointestinal    Blood in stool:     Vomited blood:         Genitourinary    Burning when urinating:     Blood in urine:        Psychiatric    Major depression:         Hematologic    Bleeding problems:    Problems with blood clotting too easily:        Skin    Rashes or ulcers:        Constitutional    Fever or chills:      PHYSICAL EXAMINATION:  Vitals:   07/01/23 1325  BP: (!) 135/99  Pulse: 87  Resp: 18  Temp: 98.1 F (36.7 C)  TempSrc: Temporal  SpO2: 96%  Weight: 203 lb (92.1 kg)  Height: 5\' 8"  (1.727 m)    General:  WDWN in NAD; vital signs documented above Gait: Not observed HENT: WNL, normocephalic Pulmonary: normal non-labored breathing , without Rales, rhonchi,  wheezing Cardiac: regular HR Abdomen: soft, NT, no masses Skin: without rashes Vascular Exam/Pulses: Palpable right anterior tibial artery pulse Extremities: Extensive ulcer  right lateral plantar foot with odor; necrotic wound bed with surrounding erythema and pitting edema to the midshin. Musculoskeletal: no muscle wasting or atrophy  Neurologic: A&O X 3 Psychiatric:  The pt has Normal affect.      ASSESSMENT/PLAN:: 52 y.o. male here for evaluation of new diabetic wound of his right foot  Mr. Sean Carter is a 52 year old male who underwent great toe amputation of the right  foot.  He subsequently required TMA which was slow to heal.  He states he developed a new ulcer about a week ago.  On exam he has an extensive necrotic ulcer on his plantar lateral right foot.  He continues to have a palpable anterior tibial artery pulse suggesting small vessel disease into the foot.  Dr. Randie Heinz also evaluated the patient today.  Unfortunately despite his adequate circulation he has an extensive diabetic ulcer on his right foot.  Time was spent with the patient discussing how wound care will likely not be able to heal this ulceration.  He will ultimately be best served with an below the knee amputation.  Currently he is unwilling to proceed with BKA as he has been out of work for a long period of time related to his TMA surgery.  He will be put on Bactrim for 2 weeks and follow-up in office for further discussion regarding below the knee amputation.   Emilie Rutter, PA-C [facility] [phone]  Clinic MD:   Randie Heinz

## 2023-07-01 NOTE — Assessment & Plan Note (Signed)
Diabetes longstanding currently uncontrolled and not checking blood sugars. Patient is able to verbalize appropriate hypoglycemia management plan. Patient appears adherent with medication and titrated up on his insulin glargine from 28 to 33 units daily. Dexcom G7 placed on patient's left arm successfully. -Continued basal insulin Basaglar (insulin glargine) 33 units daily. -Continued  rapid insulin Humalog (insulin lispro) 8 units with meals.

## 2023-07-01 NOTE — Progress Notes (Signed)
S:     Chief Complaint  Patient presents with   Patient Education    Dexcom G7 initiation    Patient arrives in good spirits. Presents for diabetes management and Dexcom G7 application.  Patient was referred and last seen by Primary Care Provider on 06/25/23.   Patient taking >1 gram acetaminophen every 6 hours: No patient take 650 mg every 6 hours PRN  Insurance coverage/medication affordability: None will have A[employer] next month (< 2 weeks)   Patient reports adherence with medications.  Current diabetes medications include: Basaglar (insulin glargine) 33 units daily, Humalog (insulin lispro) 8 units with meals Current hypertension medications include: amlodipine 10 mg daily, hydrochlorothiazide 25 mg daily Current hyperlipidemia medications include: atorvastatin 40 mg daily  Patient denies hypoglycemic events.  Patient reports self foot exams.    Dexcom G7 patient education  Instruction: Patient oriented to Dexcom G7 continuous glucose monitor (sensor, cell phone) -Dexcom G7 AND dexcom clarity app downloaded onto cellphone:Yes  -Patient educated that Dexom G7 app must always be running (patient should not close out of app) -If using Dexcom G7 app, patient may share blood glucose data with up to 10 followers on dexcom follow app.  CGM overview and set-up  1. Button, touch screen, and icons 2. Power supply and recharging 3. Home screen 4. Date and time 5. Set BG target range: 70-180 6. Set alarm/alert tone   -Urgent low (automatic): yes -Urgent Low Soon: yes -Low Glucose: yes -High Glucose: yes -Rise Rate: yes -Fall Rate: yes -No Readings Alert: yes 7. Interstitial vs. capillary blood glucose readings  8. When to verify sensor reading with fingerstick blood glucose 9. Blood glucose reading measured every five minutes. 10. Sensor will last 10 days 12. Sensor must be within 20 feet of cell phone.  Sensor application -- sensor placed on left arm 1. Site selection  and site prep with alcohol pad 2. Sensor prep-sensor pack and sensor applicator 3. Sensor applied to area away from waistband, scarring, tattoos, irritation, and bones 4. Transmitter sanitized with alcohol pad and inserted into sensor. 5. Starting the sensor: 25 min warm up before BG readings available 6. Sensor change every 10 days and rotate site 7. Call pharmacy clinic if sensor comes off before 10 days  Safety and Troubleshooting 1. Do a fingerstick blood glucose test if the sensor readings do not match how    you feel 2. Remove sensor prior to magnetic resonance imaging (MRI), computed tomography (CT) scan, or high-frequency electrical heat (diathermy) treatment. 3. Do not allow sun screen or insect repellant to come into contact with Dexcom G7. These skin care products may lead for the plastic used in the Dexcom G7 to crack. 4. Dexcom G7 may be worn through a Industrial/product designer. It may not be exposed to an advanced Imaging Technology (AIT) body scanner (also called a millimeter wave scanner) or the baggage x-ray machine. Instead, ask for hand-wanding or full-body pat-down and visual inspection.  5. Doses of acetaminophen (Tylenol) >1 gram every 6 hours may cause false high readings. 6. Store sensor kit between 36 and 86 degrees Farenheit. Can be refrigerated within this temperature range.  O:  Physical Exam Constitutional:      Appearance: Normal appearance.  Psychiatric:        Mood and Affect: Mood normal.        Behavior: Behavior normal.        Thought Content: Thought content normal.  Judgment: Judgment normal.    Review of Systems  All other systems reviewed and are negative.  Lab Results  Component Value Date   HGBA1C 8.4 (A) 06/25/2023   Vitals:   07/01/23 1427  BP: 134/78  Pulse: 73  SpO2: 100%    Lipid Panel     Component Value Date/Time   CHOL 103 01/19/2023 0443   CHOL 171 11/17/2021 1732   TRIG 154 (H) 01/19/2023 0443   HDL 19 (L)  01/19/2023 0443   HDL 41 11/17/2021 1732   CHOLHDL 5.4 01/19/2023 0443   VLDL 31 01/19/2023 0443   LDLCALC 53 01/19/2023 0443   LDLCALC 90 11/17/2021 1732   Clinical Atherosclerotic Cardiovascular Disease (ASCVD): No  The ASCVD Risk score (Arnett DK, et al., 2019) failed to calculate for the following reasons:   The valid HDL cholesterol range is 20 to 100 mg/dL   The valid total cholesterol range is 130 to 320 mg/dL   A/P: Diabetes  Diabetes longstanding currently uncontrolled and not checking blood sugars. Patient is able to verbalize appropriate hypoglycemia management plan. Patient appears adherent with medication and titrated up on his insulin glargine from 28 to 33 units daily. Dexcom G7 placed on patient's left arm successfully. -Continued basal insulin Basaglar (insulin glargine) 33 units daily. -Continued  rapid insulin Humalog (insulin lispro) 8 units with meals.  -Extensively discussed pathophysiology of diabetes, recommended lifestyle interventions, dietary effects on blood sugar control -Counseled on s/sx of and management of hypoglycemia -Next A1C anticipated 09/2023.   Written patient instructions provided.   Total time in face to face counseling 25 minutes.   Follow up Pharmacist: PRN PCP; PRN Patient seen with Alesia Banda, PharmD Candidate.

## 2023-07-02 ENCOUNTER — Telehealth: Payer: Self-pay | Admitting: Student

## 2023-07-02 NOTE — Telephone Encounter (Signed)
Called patient 2nd time to discuss concerns. See earlier phone note. Left VM asking for him to reach out over patient portal.

## 2023-07-02 NOTE — Progress Notes (Signed)
Reviewed and agree with Dr Koval's plan.   

## 2023-07-02 NOTE — Telephone Encounter (Signed)
Called to discuss visit with podiatry/VVS with patient. No answer, left VM that I would call back.

## 2023-07-03 ENCOUNTER — Telehealth: Payer: Self-pay | Admitting: Family Medicine

## 2023-07-03 NOTE — Telephone Encounter (Signed)
After Hours Call  Received after hours call, pt concerned about glucose readings rapidly changing on his new Dexcom. Got Dexcom from Dr. Raymondo Band recently. Woke up this morning and had reading of 126, but went up to 316 without doing anything. He reports having an active leg infection and being placed on antibiotics recently. Reports feeling totally normal and asymptomatic. Denies vision changes, headache.  - Reassured that pt feels asymptomatic. It is likely that pt's infection is leading to hyperglycemia, but there may also be issues with the new Dexcom. Recommended to check with his BG with a fingerstick glucose machine (he still has his), to confirm any concerning or abnormal readings.

## 2023-07-04 ENCOUNTER — Telehealth: Payer: Self-pay | Admitting: Student

## 2023-07-04 NOTE — Telephone Encounter (Signed)
Called to discuss patient's concerns and to follow up on his after hours call. Told patient I do not got messages over the weekend, but would try to call back. Also told patient in message that I'm switching to nights and may be hard to get ahold of.   Provider will try to call back

## 2023-07-09 ENCOUNTER — Telehealth: Payer: Self-pay | Admitting: Student

## 2023-07-09 NOTE — Telephone Encounter (Signed)
Called to discuss need for amputation with patient. Was told anytime after 3 pm would be good to call, but was forwarded to VM.  Will attempt to call back

## 2023-07-12 ENCOUNTER — Telehealth: Payer: Self-pay | Admitting: Pharmacist

## 2023-07-12 DIAGNOSIS — E1151 Type 2 diabetes mellitus with diabetic peripheral angiopathy without gangrene: Secondary | ICD-10-CM

## 2023-07-12 NOTE — Telephone Encounter (Signed)
Attempted to contact patient for follow-up of glucose readings on Dexcom and possible dose adjustment of insulin.     Left HIPAA compliant voice mail requesting call back to direct phone: [phone] Attempted second call 6 hours later.   Insulin titration anticipated given  Average glucose 188 mg/dl GMI 7.8 and minimal low readings 0% low Time in Range 50% Time above 180 = 50%   Total time with patient call and documentation of interaction: 13 minutes.  Follow-up phone call planned: 9.24

## 2023-07-13 ENCOUNTER — Other Ambulatory Visit: Payer: Self-pay

## 2023-07-13 ENCOUNTER — Other Ambulatory Visit: Payer: Self-pay | Admitting: Pharmacist

## 2023-07-13 DIAGNOSIS — E1151 Type 2 diabetes mellitus with diabetic peripheral angiopathy without gangrene: Secondary | ICD-10-CM

## 2023-07-13 MED ORDER — INSULIN LISPRO (1 UNIT DIAL) 100 UNIT/ML (KWIKPEN)
10.0000 [IU] | PEN_INJECTOR | Freq: Three times a day (TID) | SUBCUTANEOUS | 1 refills | Status: DC
  Filled 2023-07-13: qty 15, 50d supply, fill #0

## 2023-07-13 MED ORDER — INSULIN LISPRO 100 UNIT/ML IJ SOLN
10.0000 [IU] | Freq: Three times a day (TID) | INTRAMUSCULAR | 1 refills | Status: DC
Start: 2023-07-13 — End: 2023-07-13
  Filled 2023-07-13: qty 10, 28d supply, fill #0

## 2023-07-13 MED ORDER — BASAGLAR KWIKPEN 100 UNIT/ML ~~LOC~~ SOPN
35.0000 [IU] | PEN_INJECTOR | Freq: Every day | SUBCUTANEOUS | 1 refills | Status: DC
Start: 2023-07-13 — End: 2023-10-22
  Filled 2023-07-13: qty 12, 34d supply, fill #0

## 2023-07-13 NOTE — Telephone Encounter (Signed)
Patient coming by office to sign [employer] application.

## 2023-07-13 NOTE — Telephone Encounter (Signed)
Patient returns call from yesterday and reports he is doing well.  We discussed CGM report and option to increase insulin doses.   Current Medications include:  Humalog (insulin lispro) 8 units TID Basaglar (insulin glargine) 33 units daily Patient denies any significant medication related side effects.  Medication Plan: -increasing     Humalog (insulin lispro) from 8 to 10 units TID Basaglar (insulin glargine) from 33 to 35 units daily   Total time with patient call and documentation of interaction: 6 minutes.  F/U Phone call planned: 2-3 weeks for CGM review  New Rxs for short-term supply through [facility] pharmacy sent as well. See note from [facility].

## 2023-07-13 NOTE — Telephone Encounter (Signed)
Discussed insulin dose adjustment with Siri Cole, CPhT and sent new prescriptions as requested to [facility] pharmacy for short term fill Med assistance.   DOH - PAP pending. [employer].

## 2023-07-13 NOTE — Addendum Note (Signed)
Addended by: Kathrin Ruddy on: 07/13/2023 01:39 PM   Modules accepted: Orders

## 2023-07-13 NOTE — Addendum Note (Signed)
Addended by: Kathrin Ruddy on: 07/13/2023 12:25 PM   Modules accepted: Orders

## 2023-07-13 NOTE — Telephone Encounter (Signed)
Patients assistance application is still pending. He is currently out of medication.  Could a refill of Humalog and Basaglar be sent to [facility] pharmacy with a note to fill as "DOH - PAP pending"?   I will get patient to complete his portions of application again in the meantime.

## 2023-07-14 ENCOUNTER — Ambulatory Visit (INDEPENDENT_AMBULATORY_CARE_PROVIDER_SITE_OTHER): Payer: Self-pay | Admitting: Physician Assistant

## 2023-07-14 ENCOUNTER — Other Ambulatory Visit: Payer: Self-pay

## 2023-07-14 VITALS — BP 126/79 | Temp 98.0°F | Resp 18 | Ht 68.0 in | Wt 201.0 lb

## 2023-07-14 DIAGNOSIS — L97519 Non-pressure chronic ulcer of other part of right foot with unspecified severity: Secondary | ICD-10-CM

## 2023-07-14 NOTE — Telephone Encounter (Signed)
Reviewed and agree with Dr Koval's plan.   

## 2023-07-14 NOTE — Progress Notes (Signed)
HISTORY AND PHYSICAL     CC:  follow up. Requesting Provider:  Bess Kinds, MD  HPI: This is a 52 y.o. male who is here today for follow up for PAD.  Pt has hx of ray amputation of left great toe in 2021 by Dr. Chestine Spore.  He underwent right 1st and 2nd partial ray amputation on 07/22/2022 by Dr. Lenell Antu.   On 01/18/2023, he underwent arteriogram with attempted angioplasty of the right PTA by Dr. Edilia Bo.  On 01/19/2023, he underwent open right TMA with vac closure by Dr. Randie Heinz.   On 01/22/2023, he underwent washout and partial closure of his right TMA with vac placement also by Dr. Randie Heinz.   Pt was last seen 07/01/2023 and at that time, he had developed an ulcer on the lateral dorsum of the right foot.  He was taking abx that had been prescribed to his girlfriend after wisdom teeth extraction.  He felt the ulcer was getting better.  He was not having any fever or chills.  He was found to have an extensive necrotic ulcer on the plantar aspect of right foot.  He had a palpable ATA pulse suggesting small vessel disease.  Discussion about wound not healing was had and that he would best be served with amputation.  He was not willing at that time to proceed as he had been out of work for a long period of time due to TMA surgery.  He was placed on Bactrim and brought bac for re-evaluation.   He does have hx of diabetes and last hgbA1c was 8.4.  The pt returns today for follow up and here with his girlfriend.  He states he feels the wound is a little better.  He does not have any pain in his foot.  He has had a cold but has not had any fevers or chills.  He is getting a scooter to rest his knee on this coming Monday to help keep pressure off of his foot.  He did have a little swelling at the ankle today.  He stood more yesterday than normal.  He does not have any pain in the right calf.   The pt is on a statin for cholesterol management.    The pt is on an aspirin.    Other AC:  none The pt is on CCB, diuretic for  hypertension.  The pt is is on medication for diabetes. Tobacco hx:  never    Past Medical History:  Diagnosis Date   Blurry vision 07/10/2020   Diabetes mellitus without complication ([name])    Gangrene of toe of left foot ([name])    Sepsis due to Streptococcus, group B ([name]) 06/20/2020   Streptococcal bacteremia     Past Surgical History:  Procedure Laterality Date   ABDOMINAL AORTOGRAM W/LOWER EXTREMITY N/A 01/18/2023   Procedure: ABDOMINAL AORTOGRAM W/LOWER EXTREMITY;  Surgeon: Chuck Hint, MD;  Location: [facility] INVASIVE CV LAB;  Service: Cardiovascular;  Laterality: N/A;   AMPUTATION Left 05/31/2020   Procedure: Ray Amputation of Left Great Toe with Negative Pressure Vac Placement;  Surgeon: Cephus Shelling, MD;  Location: [facility] OR;  Service: Vascular;  Laterality: Left;   AMPUTATION Right 07/22/2022   Procedure: RIGHT FIRST AND SECOND RAY PARTIAL  AMPUTATION;  Surgeon: Leonie Douglas, MD;  Location: [facility] OR;  Service: Vascular;  Laterality: Right;   APPLICATION OF WOUND VAC Right 01/22/2023   Procedure: APPLICATION OF WOUND VAC, RIGHT FOOT;  Surgeon: Maeola Harman, MD;  Location:  [facility] OR;  Service: Vascular;  Laterality: Right;   INCISION AND DRAINAGE OF WOUND Right 01/22/2023   Procedure: WASHOUT OF RIGHT FOOT WOUND;  Surgeon: Maeola Harman, MD;  Location: [facility] OR;  Service: Vascular;  Laterality: Right;   PERIPHERAL VASCULAR BALLOON ANGIOPLASTY  01/18/2023   Procedure: PERIPHERAL VASCULAR BALLOON ANGIOPLASTY;  Surgeon: Chuck Hint, MD;  Location: [facility] INVASIVE CV LAB;  Service: Cardiovascular;;   STUMP REVISION Right 01/22/2023   Procedure: PARTIAL CLOSURE OF RIGHT FOOT WOUND;  Surgeon: Maeola Harman, MD;  Location: [facility] OR;  Service: Vascular;  Laterality: Right;   TRANSMETATARSAL AMPUTATION Right 01/19/2023   Procedure: TRANSMETATARSAL AMPUTATION;  Surgeon: Maeola Harman, MD;  Location: [facility] OR;  Service: Vascular;  Laterality: Right;     Allergies  Allergen Reactions   Scallops [Shellfish Allergy] Anaphylaxis    Current Outpatient Medications  Medication Sig Dispense Refill   acetaminophen (TYLENOL) 325 MG tablet Take 2 tablets (650 mg total) by mouth every 6 (six) hours as needed for headache or mild pain. 90 tablet 2   albuterol (VENTOLIN HFA) 108 (90 Base) MCG/ACT inhaler Inhale 2 puffs into the lungs every 6 (six) hours as needed for wheezing or shortness of breath. INHALE 2 PUFFS BY MOUTH EVERY 6 HOURS AS NEEDED FOR WHEEZING OR SHORTNESS OF BREATH Strength: 108 (90 Base) MCG/ACT 8 g 0   amLODipine (NORVASC) 10 MG tablet TAKE 1 TABLET BY MOUTH ONCE DAILY AT BEDTIME 90 tablet 2   aspirin EC 81 MG tablet Take 1 tablet (81 mg total) by mouth daily. Swallow whole. 30 tablet 12   atorvastatin (LIPITOR) 40 MG tablet Take 1 tablet by mouth once daily 90 tablet 1   Blood Gluc Meter Disp-Strips (RELION ALL-IN-ONE) DEVI 1 each by Does not apply route 3 (three) times daily as needed. (Patient not taking: Reported on 07/01/2023) 1 each 3   blood glucose meter kit and supplies KIT Dispense based on patient and insurance preference. Use up to four times daily as directed. (Patient not taking: Reported on 07/01/2023) 1 each 0   Blood Glucose Monitoring Suppl DEVI 1 each by Does not apply route in the morning, at noon, and at bedtime. May substitute to any manufacturer covered by patient's insurance. (Patient not taking: Reported on 07/01/2023) 1 each 0   brompheniramine-pseudoephedrine-DM 30-2-10 MG/5ML syrup Take 5 mLs by mouth 4 (four) times daily as needed. (Patient not taking: Reported on 07/01/2023) 140 mL 0   cetirizine (ZYRTEC) 10 MG tablet Take 1 tablet (10 mg total) by mouth daily. 90 tablet 3   diphenhydrAMINE (BENADRYL) 25 MG tablet Take 25 mg by mouth at bedtime.     famotidine (PEPCID) 40 MG tablet Take 1 tablet by mouth twice daily 60 tablet 3   hydrochlorothiazide (HYDRODIURIL) 25 MG tablet Take 1 tablet by mouth once daily  90 tablet 2   Insulin Glargine (BASAGLAR KWIKPEN) 100 UNIT/ML Inject 35 Units into the skin daily. 6 mL 1   insulin lispro (HUMALOG KWIKPEN) 100 UNIT/ML KwikPen Inject 10 Units into the skin 3 (three) times daily. 15 mL 1   montelukast (SINGULAIR) 10 MG tablet TAKE 1 TABLET BY MOUTH AT BEDTIME 90 tablet 1   Multiple Vitamin (MULTIVITAMIN) tablet Take 1 tablet by mouth daily. (Patient not taking: Reported on 07/01/2023)     ReliOn Lancets Thin 26G MISC 1 each by Does not apply route 3 (three) times daily as needed. (Patient not taking: Reported on 07/01/2023) 1 each 3  rOPINIRole (REQUIP) 1 MG tablet Take 1 tablet (1 mg total) by mouth at bedtime. 30 tablet 5   sulfamethoxazole-trimethoprim (BACTRIM DS) 800-160 MG tablet Take 1 tablet by mouth 2 (two) times daily for 14 days. 28 tablet 0   traMADol (ULTRAM) 50 MG tablet Take 1 tablet (50 mg total) by mouth every 12 (twelve) hours as needed for severe pain. (Patient not taking: Reported on 07/01/2023) 30 tablet 0   ULTICARE INSULIN SYRINGE 30G X 1/2" 0.3 ML MISC See admin instructions.  (Patient not taking: Reported on 07/01/2023)     No current facility-administered medications for this visit.    No family history on file.  Social History   Socioeconomic History   Marital status: Married    Spouse name: Not on file   Number of children: Not on file   Years of education: Not on file   Highest education level: 12th grade  Occupational History   Not on file  Tobacco Use   Smoking status: Never    Passive exposure: Never   Smokeless tobacco: Never  Vaping Use   Vaping status: Never Used  Substance and Sexual Activity   Alcohol use: Not on file   Drug use: Never   Sexual activity: Not on file  Other Topics Concern   Not on file  Social History Narrative   Not on file   Social Determinants of Health   Financial Resource Strain: Medium Risk (01/28/2023)   Overall Financial Resource Strain (CARDIA)    Difficulty of Paying Living  Expenses: Somewhat hard  Food Insecurity: No Food Insecurity (01/28/2023)   Hunger Vital Sign    Worried About Running Out of Food in the Last Year: Never true    Ran Out of Food in the Last Year: Never true  Transportation Needs: No Transportation Needs (01/28/2023)   PRAPARE - Administrator, Civil Service (Medical): No    Lack of Transportation (Non-Medical): No  Physical Activity: Unknown (01/28/2023)   Exercise Vital Sign    Days of Exercise per Week: 6 days    Minutes of Exercise per Session: Patient declined  Stress: Patient Declined (01/28/2023)   [employer] of Occupational Health - Occupational Stress Questionnaire    Feeling of Stress : Patient declined  Social Connections: Moderately Integrated (01/28/2023)   Social Connection and Isolation Panel [NHANES]    Frequency of Communication with Friends and Family: More than three times a week    Frequency of Social Gatherings with Friends and Family: Once a week    Attends Religious Services: More than 4 times per year    Active Member of [employer] or Organizations: No    Attends Engineer, structural: Not on file    Marital Status: Married  Catering manager Violence: Not At Risk (01/18/2023)   Humiliation, Afraid, Rape, and Kick questionnaire    Fear of Current or Ex-Partner: No    Emotionally Abused: No    Physically Abused: No    Sexually Abused: No     REVIEW OF SYSTEMS:   [X]  denotes positive finding, [ ]  denotes negative finding Cardiac  Comments:  Chest pain or chest pressure:    Shortness of breath upon exertion:    Short of breath when lying flat:    Irregular heart rhythm:        Vascular    Pain in calf, thigh, or hip brought on by ambulation:    Pain in feet at night that wakes you up from  your sleep:     Blood clot in your veins:    Leg swelling:         Pulmonary    Oxygen at home:    Productive cough:     Wheezing:         Neurologic    Sudden weakness in arms or legs:      Sudden numbness in arms or legs:     Sudden onset of difficulty speaking or slurred speech:    Temporary loss of vision in one eye:     Problems with dizziness:         Gastrointestinal    Blood in stool:     Vomited blood:         Genitourinary    Burning when urinating:     Blood in urine:        Psychiatric    Major depression:         Hematologic    Bleeding problems:    Problems with blood clotting too easily:        Skin    Rashes or ulcers: x       Constitutional    Fever or chills:      PHYSICAL EXAMINATION:  Today's Vitals   07/14/23 0949 07/14/23 0951  BP: 126/79   Resp: 18   Temp: 98 F (36.7 C)   TempSrc: Temporal   Weight: 201 lb (91.2 kg)   Height: 5\' 8"  (1.727 m)   PainSc: 0-No pain 0-No pain   Body mass index is 30.56 kg/m.   General:  WDWN in NAD; vital signs documented above Gait: Not observed HENT: WNL, normocephalic Pulmonary: normal non-labored breathing , without wheezing Cardiac: regular HR Skin: well healed TMA incision right foot.  Vascular Exam/Pulses: Easily palpable right AT pulse. Extremities:    Musculoskeletal: no muscle wasting or atrophy  Neurologic: A&O X 3 Psychiatric:  The pt has Normal affect.    ASSESSMENT/PLAN:: 52 y.o. male here for follow up for PAD with hx of  ray amputation of left great toe in 2021 by Dr. Chestine Spore.  He underwent right 1st and 2nd partial ray amputation on 07/22/2022 by Dr. Lenell Antu.   On 01/18/2023, he underwent arteriogram with attempted angioplasty of the right PTA by Dr. Edilia Bo.  On 01/19/2023, he underwent open right TMA with vac closure by Dr. Randie Heinz.   On 01/22/2023, he underwent washout and partial closure of his right TMA with vac placement also by Dr. Randie Heinz.    -pt does have palpable right AT pulse.  -continues to have plantar ulceration.  Still with some necrotic tissue but less than previous visit per photo.   -pt seen with Dr. Randie Heinz and he has concerns that this could involve bone.  Pt  understands he is at high risk for amputation.  Dr. Randie Heinz suggests referral to Dr. Lajoyce Corners for further recommendations for limb salvage.   -continue asa/statin -pt will f/u with [facility] in the future after seeing Dr. Erasmo Score, [facility] [phone]  Clinic MD:   Randie Heinz

## 2023-07-16 ENCOUNTER — Other Ambulatory Visit: Payer: Self-pay

## 2023-07-20 ENCOUNTER — Encounter: Payer: Self-pay | Admitting: Orthopedic Surgery

## 2023-07-20 ENCOUNTER — Ambulatory Visit (INDEPENDENT_AMBULATORY_CARE_PROVIDER_SITE_OTHER): Payer: Self-pay | Admitting: Orthopedic Surgery

## 2023-07-20 DIAGNOSIS — Z89431 Acquired absence of right foot: Secondary | ICD-10-CM

## 2023-07-20 DIAGNOSIS — M86471 Chronic osteomyelitis with draining sinus, right ankle and foot: Secondary | ICD-10-CM

## 2023-07-20 MED ORDER — DOXYCYCLINE HYCLATE 100 MG PO TABS
100.0000 mg | ORAL_TABLET | Freq: Two times a day (BID) | ORAL | 0 refills | Status: DC
Start: 2023-07-20 — End: 2023-08-26

## 2023-07-20 NOTE — Telephone Encounter (Signed)
Refaxed entire completed app 07/20/23

## 2023-07-20 NOTE — Progress Notes (Signed)
Office Visit Note   Patient: Sean Carter           Date of Birth: 23-Dec-1970           MRN: [id number] Visit Date: 07/20/2023              Requested by: Maeola Harman, MD [address] [place],  [place] [address] PCP: Bess Kinds, MD  Chief Complaint  Patient presents with   Right Foot - Wound Check      HPI: Patient is a 52 year old gentleman who is seen for initial evaluation for necrotic ulcer lateral right foot.  Patient is status post transmetatarsal amputation in April.  Patient is status post endovascular treatment in April with ankle-brachial indices showing noncompressible flow to the right lower extremity.  Assessment & Plan: Visit Diagnoses:  1. Chronic osteomyelitis of right foot with draining sinus ([name])   2. History of transmetatarsal amputation of right foot ([name])     Plan: Recommended proceeding with a transtibial amputation on the right.  Discussed with the necrotic ulcer and exposed bones laterally of the hindfoot that patient does not have foot salvage intervention options at this time.  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  A prescription is called in for doxycycline will follow-up in 4 weeks.  Discussed that if patient became septic he would need to go to [facility] emergency room and we would proceed with surgery urgently.  Follow-Up Instructions: Return in about 4 weeks (around 08/17/2023).   Ortho Exam  Patient is alert, oriented, no adenopathy, well-dressed, normal affect, normal respiratory effort. Examination of the right foot he has a well-healed transmetatarsal amputation laterally there is a necrotic wound that is 5 cm in diameter with exposed necrotic bone.  Hemoglobin A1c 8.4. Imaging: No results found. No images are attached to the encounter.  Labs: Lab Results  Component Value Date   HGBA1C 8.4 (A) 06/25/2023   HGBA1C 7.2 (H) 01/16/2023   HGBA1C 6.8 06/29/2022   ESRSEDRATE 29 (H)  07/10/2020   CRP 4.1 07/10/2020   REPTSTATUS 01/23/2023 FINAL 01/19/2023   GRAMSTAIN  01/19/2023    NO WBC SEEN MODERATE GRAM NEGATIVE RODS FEW GRAM POSITIVE COCCI IN PAIRS    CULT  01/19/2023    RARE GROUP B STREP(S.AGALACTIAE)ISOLATED RARE PREVOTELLA BIVIA FEW FUSOBACTERIUM NUCLEATUM RARE EIKENELLA CORRODENS RARE PASTEURELLA SPECIES IDENTIFIED AS P CANIS AT [facility] Usually susceptible to penicillin and other beta lactam agents,quinolones,macrolides and tetracyclines. TESTING AGAINST S. AGALACTIAE NOT ROUTINELY PERFORMED DUE TO PREDICTABILITY OF AMP/PEN/VAN SUSCEPTIBILITY. BETA LACTAMASE POSITIVE Performed at [facility], [address] [place], [place] [address]    LABORGA PROTEUS PENNERI 07/22/2022     Lab Results  Component Value Date   ALBUMIN 4.3 02/17/2023   ALBUMIN 3.8 01/29/2023   ALBUMIN 2.4 (L) 01/26/2023    Lab Results  Component Value Date   MG 1.8 07/24/2022   MG 1.8 07/23/2022   MG 2.0 07/22/2022   No results found for: "VD25OH"  No results found for: "PREALBUMIN"    Latest Ref Rng & Units 01/25/2023    1:13 AM 01/24/2023    1:02 AM 01/23/2023   12:58 AM  CBC EXTENDED  WBC 4.0 - 10.5 K/uL 16.2  18.5  20.0   RBC 4.22 - 5.81 MIL/uL 3.22  3.10  3.19   Hemoglobin 13.0 - 17.0 g/dL 9.3  9.0  9.3   HCT 08.6 - 52.0 %  27.8  26.9  27.4   Platelets 150 - 400 K/uL 517  452  468   NEUT# 1.7 - 7.7 K/uL 10.8  12.5    Lymph# 0.7 - 4.0 K/uL 3.2  3.9       There is no height or weight on file to calculate BMI.  Orders:  No orders of the defined types were placed in this encounter.  Meds ordered this encounter  Medications   doxycycline (VIBRA-TABS) 100 MG tablet    Sig: Take 1 tablet (100 mg total) by mouth 2 (two) times daily.    Dispense:  60 tablet    Refill:  0     Procedures: No procedures performed  Clinical Data: No additional findings.  ROS:  All other systems negative, except as noted in the HPI. Review of  Systems  Objective: Vital Signs: There were no vitals taken for this visit.  Specialty Comments:  No specialty comments available.  PMFS History: Patient Active Problem List   Diagnosis Date Noted   Restless legs syndrome 06/25/2023   Hyperkalemia 02/18/2023   S/P transmetatarsal amputation of foot, right ([name]) 01/31/2023   Transaminitis 01/24/2023   AKI (acute kidney injury) ([name]) 01/16/2023   Diabetic infection of right foot ([name]) 01/16/2023   Open wound of plantar aspect of foot 01/16/2023   Restrictive lung disease 01/15/2023   History of wheezing 08/28/2022   Symptoms of gastroesophageal reflux 08/28/2022   Leukocytosis 07/20/2022   Diabetic retinopathy ([name]) 06/29/2022   Mixed hyperlipidemia 11/20/2021   Blurry vision 07/10/2020   Superficial vein thrombosis 06/06/2020   Hypertension associated with diabetes ([name]) 06/06/2020   Diabetes mellitus type 2 with peripheral artery disease ([name]) 06/06/2020   Amputation of left great toe [facility])    Past Medical History:  Diagnosis Date   Blurry vision 07/10/2020   Diabetes mellitus without complication ([name])    Gangrene of toe of left foot ([name])    Sepsis due to Streptococcus, group B ([name]) 06/20/2020   Streptococcal bacteremia     History reviewed. No pertinent family history.  Past Surgical History:  Procedure Laterality Date   ABDOMINAL AORTOGRAM W/LOWER EXTREMITY N/A 01/18/2023   Procedure: ABDOMINAL AORTOGRAM W/LOWER EXTREMITY;  Surgeon: Chuck Hint, MD;  Location: [facility] INVASIVE CV LAB;  Service: Cardiovascular;  Laterality: N/A;   AMPUTATION Left 05/31/2020   Procedure: Ray Amputation of Left Great Toe with Negative Pressure Vac Placement;  Surgeon: Cephus Shelling, MD;  Location: [facility] OR;  Service: Vascular;  Laterality: Left;   AMPUTATION Right 07/22/2022   Procedure: RIGHT FIRST AND SECOND RAY PARTIAL  AMPUTATION;  Surgeon: Leonie Douglas, MD;  Location: [facility] OR;  Service: Vascular;  Laterality: Right;    APPLICATION OF WOUND VAC Right 01/22/2023   Procedure: APPLICATION OF WOUND VAC, RIGHT FOOT;  Surgeon: Maeola Harman, MD;  Location: [facility] OR;  Service: Vascular;  Laterality: Right;   INCISION AND DRAINAGE OF WOUND Right 01/22/2023   Procedure: WASHOUT OF RIGHT FOOT WOUND;  Surgeon: Maeola Harman, MD;  Location: [facility] OR;  Service: Vascular;  Laterality: Right;   PERIPHERAL VASCULAR BALLOON ANGIOPLASTY  01/18/2023   Procedure: PERIPHERAL VASCULAR BALLOON ANGIOPLASTY;  Surgeon: Chuck Hint, MD;  Location: [facility] INVASIVE CV LAB;  Service: Cardiovascular;;   STUMP REVISION Right 01/22/2023   Procedure: PARTIAL CLOSURE OF RIGHT FOOT WOUND;  Surgeon: Maeola Harman, MD;  Location: [facility] OR;  Service: Vascular;  Laterality: Right;   TRANSMETATARSAL AMPUTATION Right 01/19/2023   Procedure:  TRANSMETATARSAL AMPUTATION;  Surgeon: Maeola Harman, MD;  Location: [facility] OR;  Service: Vascular;  Laterality: Right;   Social History   Occupational History   Not on file  Tobacco Use   Smoking status: Never    Passive exposure: Never   Smokeless tobacco: Never  Vaping Use   Vaping status: Never Used  Substance and Sexual Activity   Alcohol use: Not on file   Drug use: Never   Sexual activity: Not on file

## 2023-07-22 NOTE — Telephone Encounter (Signed)
Received notification from [facility] [employer] regarding approval for [facility] & [facility]. Patient assistance approved from 07/21/23 to 07/20/24.  Medication will ship to patients home  Company phone: [phone]

## 2023-07-23 ENCOUNTER — Telehealth: Payer: Self-pay

## 2023-07-23 ENCOUNTER — Telehealth: Payer: Self-pay | Admitting: Pharmacist

## 2023-07-23 NOTE — Telephone Encounter (Signed)
Spoke to pt regarding his dissatisfaction after his referral appt. To orthopedics. MD has been made aware. Will proceed with scheduling f/u as advised by MD. Pt is aware of this.

## 2023-07-23 NOTE — Telephone Encounter (Signed)
Patient contacted for follow-up of need for Dexcom G7 sensors  Patient reports he has enjoyed having the CGM sensors.  His GMI - 7.9 and his average glucose is now < 200mg /dl.  He denies low readings.   Provided him 3 sensors to last until insurance coverage starts Early November.  Plans to pick-up 10/8   Total time with patient call and documentation of interaction: 11 minutes.

## 2023-07-26 NOTE — Telephone Encounter (Signed)
Reviewed and agree with Dr Koval's plan.   

## 2023-07-27 ENCOUNTER — Telehealth: Payer: Self-pay | Admitting: Pharmacist

## 2023-07-27 NOTE — Telephone Encounter (Signed)
Patient contacted our office for assistance with Dexcom G7 sensor which stopped working with "four days remaining" prior to scheduled time for replacing sensor.  He asks if there is anything he can due to this failing sensor.    Overall CGM is working well for him.   Review of his CGM data - Dexcom clarity report from previous 14 days GMI = 7.8  TIR 47%  Advised to simply replace sensor.  He plans to pick up additional sample sensors, previously prepared for him to pick-up - later today.  Total time with patient call and documentation of interaction: 11 minutes.  F/U Phone call planned: None.

## 2023-08-02 ENCOUNTER — Telehealth: Payer: Self-pay

## 2023-08-02 NOTE — Telephone Encounter (Signed)
Pt called to ask if he can try Ibuprofen, as the tylenol has not been adequately helping his R foot pain. He is going to try this and see if it helps. He has been advised to take with meals. No further questions/concerns at this time.

## 2023-08-02 NOTE — Telephone Encounter (Signed)
Returned pt's call regarding; left VM for him to call us back if he still needs assistance.

## 2023-08-03 ENCOUNTER — Other Ambulatory Visit: Payer: Self-pay | Admitting: Student

## 2023-08-03 ENCOUNTER — Other Ambulatory Visit: Payer: Self-pay

## 2023-08-03 DIAGNOSIS — R0982 Postnasal drip: Secondary | ICD-10-CM

## 2023-08-03 MED ORDER — ALBUTEROL SULFATE HFA 108 (90 BASE) MCG/ACT IN AERS
2.0000 | INHALATION_SPRAY | Freq: Four times a day (QID) | RESPIRATORY_TRACT | 0 refills | Status: DC | PRN
Start: 2023-08-03 — End: 2023-08-03

## 2023-08-03 MED ORDER — ALBUTEROL SULFATE HFA 108 (90 BASE) MCG/ACT IN AERS
2.0000 | INHALATION_SPRAY | Freq: Four times a day (QID) | RESPIRATORY_TRACT | 0 refills | Status: DC | PRN
Start: 2023-08-03 — End: 2023-08-04

## 2023-08-04 MED ORDER — ALBUTEROL SULFATE HFA 108 (90 BASE) MCG/ACT IN AERS: 2.0000 | INHALATION_SPRAY | Freq: Four times a day (QID) | RESPIRATORY_TRACT | 0 refills | Status: DC | PRN

## 2023-08-04 NOTE — Addendum Note (Signed)
Addended by: Bess Kinds T on: 08/04/2023 08:36 AM   Modules accepted: Orders

## 2023-08-09 ENCOUNTER — Telehealth: Payer: Self-pay | Admitting: Pharmacist

## 2023-08-09 MED ORDER — INSULIN LISPRO (1 UNIT DIAL) 100 UNIT/ML (KWIKPEN): 10.0000 [IU] | PEN_INJECTOR | Freq: Three times a day (TID) | SUBCUTANEOUS | Status: DC
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
## 2023-08-09 NOTE — Telephone Encounter (Signed)
Reviewed and agree with Dr Koval's plan.   

## 2023-08-11 ENCOUNTER — Ambulatory Visit: Payer: Self-pay | Admitting: Family Medicine

## 2023-08-17 ENCOUNTER — Ambulatory Visit: Payer: Self-pay | Admitting: Orthopedic Surgery

## 2023-08-25 ENCOUNTER — Encounter: Payer: Self-pay | Admitting: Vascular Surgery

## 2023-08-25 ENCOUNTER — Ambulatory Visit (INDEPENDENT_AMBULATORY_CARE_PROVIDER_SITE_OTHER): Payer: Self-pay | Admitting: Vascular Surgery

## 2023-08-25 VITALS — BP 147/73 | HR 78 | Temp 97.8°F | Resp 20 | Ht 68.0 in | Wt 205.0 lb

## 2023-08-25 DIAGNOSIS — L97519 Non-pressure chronic ulcer of other part of right foot with unspecified severity: Secondary | ICD-10-CM

## 2023-08-25 NOTE — Progress Notes (Signed)
[name], male   DOB: 1971-01-19, 52 y.o.   MRN: [id number]  Reason for Consult: Follow-up   Referred by Bess Kinds, MD  Subjective:     HPI:  Sean Carter is a 52 y.o. male history of right transmetatarsal amputation after nonhealing toe amputations.  He has been evaluated for the past 2 months with nonhealing wound on the plantar right foot.  He has been evaluated by Dr. Lajoyce Corners and recommended for amputation.  He now continues to work states that he is severe 10 out of 10 ankle pain and is on his feet for over 70 hours a week with work.  He denies any fevers or chills.  He has gained weight stating that he is eating without limitations.  He is not on antibiotics at this time.  Past Medical History:  Diagnosis Date   Blurry vision 07/10/2020   Diabetes mellitus without complication ([name])    Gangrene of toe of left foot ([name])    Sepsis due to Streptococcus, group B ([name]) 06/20/2020   Streptococcal bacteremia    History reviewed. No pertinent family history. Past Surgical History:  Procedure Laterality Date   ABDOMINAL AORTOGRAM W/LOWER EXTREMITY N/A 01/18/2023   Procedure: ABDOMINAL AORTOGRAM W/LOWER EXTREMITY;  Surgeon: Chuck Hint, MD;  Location: [facility] INVASIVE CV LAB;  Service: Cardiovascular;  Laterality: N/A;   AMPUTATION Left 05/31/2020   Procedure: Ray Amputation of Left Great Toe with Negative Pressure Vac Placement;  Surgeon: Cephus Shelling, MD;  Location: [facility] OR;  Service: Vascular;  Laterality: Left;   AMPUTATION Right 07/22/2022   Procedure: RIGHT FIRST AND SECOND RAY PARTIAL  AMPUTATION;  Surgeon: Leonie Douglas, MD;  Location: [facility] OR;  Service: Vascular;  Laterality: Right;   APPLICATION OF WOUND VAC Right 01/22/2023   Procedure: APPLICATION OF WOUND VAC, RIGHT FOOT;  Surgeon: Maeola Harman, MD;  Location: [facility] OR;  Service: Vascular;  Laterality: Right;   INCISION AND DRAINAGE OF WOUND Right 01/22/2023   Procedure: WASHOUT OF RIGHT  FOOT WOUND;  Surgeon: Maeola Harman, MD;  Location: [facility] OR;  Service: Vascular;  Laterality: Right;   PERIPHERAL VASCULAR BALLOON ANGIOPLASTY  01/18/2023   Procedure: PERIPHERAL VASCULAR BALLOON ANGIOPLASTY;  Surgeon: Chuck Hint, MD;  Location: [facility] INVASIVE CV LAB;  Service: Cardiovascular;;   STUMP REVISION Right 01/22/2023   Procedure: PARTIAL CLOSURE OF RIGHT FOOT WOUND;  Surgeon: Maeola Harman, MD;  Location: [facility] OR;  Service: Vascular;  Laterality: Right;   TRANSMETATARSAL AMPUTATION Right 01/19/2023   Procedure: TRANSMETATARSAL AMPUTATION;  Surgeon: Maeola Harman, MD;  Location: [facility] OR;  Service: Vascular;  Laterality: Right;    Short Social History:  Social History   Tobacco Use   Smoking status: Never    Passive exposure: Never   Smokeless tobacco: Never  Substance Use Topics   Alcohol use: Not on file    Allergies  Allergen Reactions   Scallops [Shellfish Allergy] Anaphylaxis    Current Outpatient Medications  Medication Sig Dispense Refill   acetaminophen (TYLENOL) 325 MG tablet Take 2 tablets (650 mg total) by mouth every 6 (six) hours as needed for headache or mild pain. 90 tablet 2   albuterol (VENTOLIN HFA) 108 (90 Base) MCG/ACT inhaler Inhale 2 puffs into the lungs every 6 (six) hours as needed for wheezing or shortness of breath. 8 g 0   amLODipine (NORVASC) 10 MG tablet TAKE 1 TABLET BY MOUTH ONCE DAILY AT BEDTIME 90 tablet 2  aspirin EC 81 MG tablet Take 1 tablet (81 mg total) by mouth daily. Swallow whole. 30 tablet 12   atorvastatin (LIPITOR) 40 MG tablet Take 1 tablet by mouth once daily 90 tablet 1   cetirizine (ZYRTEC) 10 MG tablet Take 1 tablet (10 mg total) by mouth daily. 90 tablet 3   Continuous Glucose Sensor (DEXCOM G7 SENSOR) MISC 3 Devices by Does not apply route as directed. Apply new sensor every 10 days     diphenhydrAMINE (BENADRYL) 25 MG tablet Take 25 mg by mouth at bedtime.     doxycycline (VIBRA-TABS) 100  MG tablet Take 1 tablet (100 mg total) by mouth 2 (two) times daily. 60 tablet 0   famotidine (PEPCID) 40 MG tablet Take 1 tablet by mouth twice daily 60 tablet 3   hydrochlorothiazide (HYDRODIURIL) 25 MG tablet Take 1 tablet by mouth once daily 90 tablet 2   Insulin Glargine (BASAGLAR KWIKPEN) 100 UNIT/ML Inject 35 Units into the skin daily. 6 mL 1   insulin lispro (HUMALOG KWIKPEN) 100 UNIT/ML KwikPen Inject 10-12 Units into the skin 3 (three) times daily.     montelukast (SINGULAIR) 10 MG tablet TAKE 1 TABLET BY MOUTH AT BEDTIME 90 tablet 1   rOPINIRole (REQUIP) 1 MG tablet Take 1 tablet (1 mg total) by mouth at bedtime. 30 tablet 5   Blood Gluc Meter Disp-Strips (RELION ALL-IN-ONE) DEVI 1 each by Does not apply route 3 (three) times daily as needed. (Patient not taking: Reported on 07/01/2023) 1 each 3   blood glucose meter kit and supplies KIT Dispense based on patient and insurance preference. Use up to four times daily as directed. (Patient not taking: Reported on 07/01/2023) 1 each 0   Blood Glucose Monitoring Suppl DEVI 1 each by Does not apply route in the morning, at noon, and at bedtime. May substitute to any manufacturer covered by patient's insurance. (Patient not taking: Reported on 07/01/2023) 1 each 0   brompheniramine-pseudoephedrine-DM 30-2-10 MG/5ML syrup Take 5 mLs by mouth 4 (four) times daily as needed. (Patient not taking: Reported on 07/01/2023) 140 mL 0   Multiple Vitamin (MULTIVITAMIN) tablet Take 1 tablet by mouth daily. (Patient not taking: Reported on 07/01/2023)     ReliOn Lancets Thin 26G MISC 1 each by Does not apply route 3 (three) times daily as needed. (Patient not taking: Reported on 07/01/2023) 1 each 3   traMADol (ULTRAM) 50 MG tablet Take 1 tablet (50 mg total) by mouth every 12 (twelve) hours as needed for severe pain. (Patient not taking: Reported on 07/01/2023) 30 tablet 0   ULTICARE INSULIN SYRINGE 30G X 1/2" 0.3 ML MISC See admin instructions.  (Patient not taking:  Reported on 07/01/2023)     No current facility-administered medications for this visit.    Review of Systems  Constitutional:  Constitutional negative. HENT: HENT negative.  Eyes: Eyes negative.  Cardiovascular: Cardiovascular negative.  GI: Gastrointestinal negative.  Musculoskeletal: Positive for leg pain and joint pain.  Skin: Positive for wound.  Neurological: Positive for numbness.  Hematologic: Hematologic/lymphatic negative.  Psychiatric: Psychiatric negative.        Objective:  Objective  Vitals:   08/25/23 0840  BP: (!) 147/73  Pulse: 78  Resp: 20  Temp: 97.8 F (36.6 C)  SpO2: 97%     Physical Exam HENT:     Head: Normocephalic.     Mouth/Throat:     Mouth: Mucous membranes are moist.  Eyes:     Pupils: Pupils are equal,  round, and reactive to light.  Cardiovascular:     Pulses: Normal pulses.  Pulmonary:     Effort: Pulmonary effort is normal.  Abdominal:     General: Abdomen is flat.  Musculoskeletal:     Right lower leg: Edema present.  Skin:    General: Skin is warm.     Capillary Refill: Capillary refill takes less than 2 seconds.  Neurological:     Mental Status: He is alert.          Assessment/Plan:     52 year old male with nonhealing gangrenous plantar diabetic foot ulcer with palpable pulses.  I have again recommended transtibial amputation but patient has limitations from work and wants to continue to work as long as possible.  I have discussed with him the signs and symptoms of infection for which she needs to urgently call or present to the emergency department and will require transtibial amputation.  He understands that amputation is imminent but wants to wait as long as possible.  I have not set a follow-up appointment with him today I have discussed with him the antibiotics and pain medicine that indicated in this situation is the only way to experience on the right.  Severe hypertension.  He demonstrates very good understanding  and will call when he is ready for amputation.     Maeola Harman MD [facility]

## 2023-08-26 ENCOUNTER — Ambulatory Visit (INDEPENDENT_AMBULATORY_CARE_PROVIDER_SITE_OTHER): Payer: Self-pay | Admitting: Student

## 2023-08-26 VITALS — BP 115/66 | HR 103 | Temp 102.7°F | Ht 68.0 in | Wt 209.5 lb

## 2023-08-26 DIAGNOSIS — R052 Subacute cough: Secondary | ICD-10-CM

## 2023-08-26 DIAGNOSIS — J3489 Other specified disorders of nose and nasal sinuses: Secondary | ICD-10-CM

## 2023-08-26 LAB — POC SOFIA 2 FLU + SARS ANTIGEN FIA
Influenza A, POC: NEGATIVE
Influenza B, POC: NEGATIVE
SARS Coronavirus 2 Ag: NEGATIVE

## 2023-08-26 MED ORDER — CEFDINIR 300 MG PO CAPS: 300.0000 mg | ORAL_CAPSULE | Freq: Two times a day (BID) | ORAL | 0 refills | Status: DC

## 2023-08-26 MED ORDER — DOXYCYCLINE HYCLATE 100 MG PO TABS: 100.0000 mg | ORAL_TABLET | Freq: Two times a day (BID) | ORAL | 0 refills | Status: DC

## 2023-08-26 MED ORDER — IPRATROPIUM BROMIDE 0.03 % NA SOLN: 2.0000 | Freq: Two times a day (BID) | NASAL | 12 refills | Status: DC

## 2023-08-26 NOTE — Patient Instructions (Addendum)
It was great to see you! Thank you for allowing me to participate in your care!   I recommend that you always bring your medications to each appointment as this makes it easy to ensure we are on the correct medications and helps [place] not miss when refills are needed.  Our plans for today:  - You can use Atrovent, 2 sprays in each nostril twice a day to help dry your nose.  Please take Tylenol and ibuprofen as needed for fever and pain.  I can send in [facility] for cough.  Please drink lots of fluid.  -Please go ahead and start your antibiotic regimen.  It consists of cefdinir and doxycycline.  You will take each these medications for 10 days.  An x-ray was ordered for you---you do not need an appointment to have this completed.  I recommend going to [facility] [address] [place] If the results are normal,I will send you a letter I will call you with results if anything is abnormal    -If your symptoms fail to improve over the next 5 to 7 days please return for care. - If you develop shortness of breath, difficulty breathing, fever lasting more than 5 days, unable to eat or drink, please go to the emergency room.  Take care and seek immediate care sooner if you develop any concerns. Please remember to show up 15 minutes before your scheduled appointment time!  Tiffany Kocher, DO [facility]

## 2023-08-26 NOTE — Progress Notes (Signed)
    SUBJECTIVE:   CHIEF COMPLAINT / HPI:   Subacute cough Patient with intermittent chronic cough, however for the past month he has had significant increased daily cough.  Coughing up phlegm.  No wheezing.  States fever started today.  Has been taking ibuprofen as needed.  Other symptoms include rhinorrhea.  No sore throat, shortness of breath, chest pain, vomiting, nausea, diarrhea.  Sometimes has pain in center of chest after coughing.  Does have history of GERD, currently taking famotidine but likely poorly controlled.  Has brought she done our office, which was suggestive of a restrictive lung disease however this has not been formally diagnosed.  PERTINENT  PMH / PSH: Hypertension, diabetes, diabetic retinopathy, history of great toe amputation  OBJECTIVE:   BP 115/66   Pulse (!) 103   Temp (!) 102.7 F (39.3 C) (Oral)   Ht 5\' 8"  (1.727 m)   Wt 209 lb 8 oz (95 kg)   SpO2 100%   BMI 31.85 kg/m    General: NAD, pleasant, repeated coughing HEENT: Normocephalic, atraumatic head. EOM intact and normal conjunctiva BL. Normal external nose.  Erythematous throat, postnasal drip. No exudate, no deviation.  Cardio: RRR, no MRG. Respiratory: CTAB, normal wob on RA GI: Abdomen is soft, not tender, not distended. BS present Skin: Warm and dry   ASSESSMENT/PLAN:   Assessment & Plan Subacute cough Febrile patient, coughing with significant comorbidities.  Non-smoker. Cannot completely rule out pneumonia at this time, given risk factors will treat with antibiotics below.  COVID and flu swab negative.  Differential includes viral URI, allergic rhinitis.  Lower suspicion for asthma exacerbation, COPD exacerbation, and bronchitis given clear lung sounds. - CXR - Cefdinir 300 mg twice daily, 10 days - Doxycycline 100 mg twice daily, 10 days - Atrovent nasal spray, for rhinorrhea - Avoid antitussives at this time - OTC analgesics - Return and ED precautions provided    Follow-up  recommendations Would consider pulmonology referral for PFTs and evaluation of chronic cough after symptoms resolve Would consider reevaluation of his GERD once acute symptoms resolve  Tiffany Kocher, DO [facility]

## 2023-08-27 ENCOUNTER — Telehealth: Payer: Self-pay | Admitting: Student

## 2023-08-27 LAB — BASIC METABOLIC PANEL
BUN/Creatinine Ratio: 22 — ABNORMAL HIGH (ref 9–20)
BUN: 30 mg/dL — ABNORMAL HIGH (ref 6–24)
CO2: 26 mmol/L (ref 20–29)
Calcium: 8.5 mg/dL — ABNORMAL LOW (ref 8.7–10.2)
Chloride: 92 mmol/L — ABNORMAL LOW (ref 96–106)
Creatinine, Ser: 1.38 mg/dL — ABNORMAL HIGH (ref 0.76–1.27)
Glucose: 220 mg/dL — ABNORMAL HIGH (ref 70–99)
Potassium: 3.3 mmol/L — ABNORMAL LOW (ref 3.5–5.2)
Sodium: 134 mmol/L (ref 134–144)
eGFR: 62 mL/min/{1.73_m2} (ref >59)

## 2023-08-27 NOTE — Telephone Encounter (Signed)
Called patient, confirmed identity.  Discussed recent BMP.  Creatinine elevated, concern for AKI.  Patient reports that he is able to keep fluids down, but overall has reduced appetite.  Encouraged p.o. intake today.  Additionally low potassium of 3.3, he is currently taking hydrochlorothiazide.  Discussed holding this medication while he is ill.  His blood pressure is well-controlled.  He has not obtained chest x-ray yet.  Follow-up with access to care pool, Dr. Claudean Severance scheduled for Monday.  At that time we will recheck electrolytes and creatinine, review chest x-ray, and ensure patient is well.

## 2023-08-30 ENCOUNTER — Ambulatory Visit (INDEPENDENT_AMBULATORY_CARE_PROVIDER_SITE_OTHER): Payer: Self-pay | Admitting: Student

## 2023-08-30 ENCOUNTER — Other Ambulatory Visit: Payer: Self-pay

## 2023-08-30 VITALS — BP 146/81 | HR 107 | Temp 100.0°F | Wt 205.4 lb

## 2023-08-30 DIAGNOSIS — S91301D Unspecified open wound, right foot, subsequent encounter: Secondary | ICD-10-CM

## 2023-08-30 DIAGNOSIS — S91309A Unspecified open wound, unspecified foot, initial encounter: Secondary | ICD-10-CM

## 2023-08-30 MED ORDER — CEFTRIAXONE SODIUM 1 G IJ SOLR
1.0000 g | Freq: Once | INTRAMUSCULAR | Status: AC
  Administered 2023-08-30: 1 g via INTRAMUSCULAR

## 2023-08-30 NOTE — Progress Notes (Signed)
    SUBJECTIVE:   CHIEF COMPLAINT / HPI:   Fever  follow-up Patient presented last week on 08/26/2023 with subacute cough and fever.  There was concern for pneumonia, and patient was placed on cefdinir/doxycycline and chest x-ray ordered.  Patient did not go to x-ray.  Today he states his cough is improved however he still had a fever over the weekend, and still feels overall fatigued.  He was last seen on 08/25/2023 for his foot ulcer at that time it was deemed that it was not infected.  He states his foot has been more painful.  Patient remains tachycardic and hypertensive today, despite having normal heart rate and prior controlled hypertension.  He reports compliance with his blood pressure medications.  Known chronic osteomyelitis.  Both vascular and orthopedic surgery recommended amputation, however patient is not agreeable to amputation this time.  We discussed given his symptoms despite current antibiotics, we highly recommend admission for IV antibiotics and consultation with vascular orthopedics for amputation.  Patient declines  admission.  Patient states he needs more time to get his affairs in order.  Resident physician and attending physician discussed this further with patient.  He states he needs to contact his insurance and workout some things including FMLA.  We discussed that most of this can be done retroactively, and should not delay admission.  Patient still declines admission..  OBJECTIVE:   BP (!) 150/70   Pulse (!) 107   Temp 100 F (37.8 C) (Oral)   Wt 205 lb 6 oz (93.2 kg)   SpO2 94%   BMI 31.23 kg/m    General: NAD, pleasant Cardio: RRR, no MRG. Respiratory: CTAB, normal wob on RA GI: Abdomen is soft, not tender, not distended. BS present MSK: Removed wrapping from right foot.  Large plantar ulcer present (previously known).  On exam wound is oozing, borders are more erythematous than prior, more sloughing and gangrene present (previously known), foot is warm  and tender. See image below. Skin: Warm and dry    ASSESSMENT/PLAN:   Assessment & Plan Open wound of plantar aspect of foot, right, subsequent encounter Suspect wound infection, given change in features from his prior visit and current symptoms of tachycardia (~1 week), intermittent fever (~1 week), fatigue. Unable to fully assess SIRS criteria, no CBC, however he is tachycardic. Cannot completely rule-out sepsis. - Recommend admission for IV antibiotics, sepsis r/o and Ortho/vascular consultation for amputation, patient declines. - Gave 1 g ceftriaxone as temporary measure to prevent spread and reduce harm - Continue current antibiotics, had mild improvement of symptoms - Patient states that he will return in 2 days, if he is not better he will be more agreeable to admission - ED precautions discussed thoroughly, patient expressed understanding and agreement - Appointment scheduled with Dr. Barbaraann Faster on Wednesday morning.    Tiffany Kocher, DO [facility]

## 2023-08-30 NOTE — Patient Instructions (Addendum)
It was great to see you! Thank you for allowing me to participate in your care!   I recommend that you always bring your medications to each appointment as this makes it easy to ensure we are on the correct medications and helps [place] not miss when refills are needed.  Our plans for today:  -We recommend admission for IV antibiotics, and we recommend speaking with orthopedic or vascular surgeon with regard to amputation. -You received 1 dose of ceftriaxone today, this is typically dosed every day, and would require hospital admission for more treatment. -Please continue your current antibiotics. -Please follow-up in the next 2 days. -If you experience worsening fevers, chills, night sweats, nausea, vomiting, shortness of breath please go to the emergency room.  Take care and seek immediate care sooner if you develop any concerns. Please remember to show up 15 minutes before your scheduled appointment time!  Tiffany Kocher, DO [facility]

## 2023-08-30 NOTE — Assessment & Plan Note (Addendum)
Suspect wound infection, given change in features from his prior visit and current symptoms of tachycardia (~1 week), intermittent fever (~1 week), fatigue. Unable to fully assess SIRS criteria, no CBC, however he is tachycardic. Cannot completely rule-out sepsis. - Recommend admission for IV antibiotics, sepsis r/o and Ortho/vascular consultation for amputation, patient declines. - Gave 1 g ceftriaxone as temporary measure to prevent spread and reduce harm - Continue current antibiotics, had mild improvement of symptoms - Patient states that he will return in 2 days, if he is not better he will be more agreeable to admission - ED precautions discussed thoroughly, patient expressed understanding and agreement - Appointment scheduled with Dr. Barbaraann Faster on Wednesday morning.

## 2023-08-31 ENCOUNTER — Telehealth: Payer: Self-pay

## 2023-08-31 NOTE — Telephone Encounter (Signed)
Received call from patient today, stating that he spoke with Dr. Randie Heinz on this morning and he has decided to proceed with below knee amputation but request for it to be done either today or tomorrow.   Contacted Dr. Randie Heinz in regards to patient's request, who advised that patient can be scheduled for surgery on this Friday, 11/15. However, if he feels like he cannot wait until due to pain, he can go to [facility] ER and be admitted to medical for pain management and scheduled for surgery at next available.   Informed patient of provider recommendations and he stated, after he gets his head around having an amputation, he would rather go to the ER either today or tomorrow. Dr. Randie Heinz made aware.

## 2023-09-01 ENCOUNTER — Ambulatory Visit: Payer: Self-pay | Admitting: Student

## 2023-09-01 ENCOUNTER — Emergency Department (HOSPITAL_COMMUNITY): Payer: Self-pay

## 2023-09-01 ENCOUNTER — Other Ambulatory Visit: Payer: Self-pay

## 2023-09-01 ENCOUNTER — Inpatient Hospital Stay (HOSPITAL_COMMUNITY)
Admission: EM | Admit: 2023-09-01 | Discharge: 2023-09-05 | DRG: 854 | Disposition: A | Discharge status: Home / Self Care | Payer: Self-pay | Attending: Family Medicine | Admitting: Family Medicine

## 2023-09-01 DIAGNOSIS — E785 Hyperlipidemia, unspecified: Secondary | ICD-10-CM | POA: Diagnosis present

## 2023-09-01 DIAGNOSIS — E119 Type 2 diabetes mellitus without complications: Secondary | ICD-10-CM

## 2023-09-01 DIAGNOSIS — Z79899 Other long term (current) drug therapy: Secondary | ICD-10-CM

## 2023-09-01 DIAGNOSIS — I70261 Atherosclerosis of native arteries of extremities with gangrene, right leg: Secondary | ICD-10-CM

## 2023-09-01 DIAGNOSIS — Z87892 Personal history of anaphylaxis: Secondary | ICD-10-CM

## 2023-09-01 DIAGNOSIS — J984 Other disorders of lung: Secondary | ICD-10-CM | POA: Diagnosis present

## 2023-09-01 DIAGNOSIS — E1152 Type 2 diabetes mellitus with diabetic peripheral angiopathy with gangrene: Secondary | ICD-10-CM | POA: Diagnosis present

## 2023-09-01 DIAGNOSIS — E860 Dehydration: Secondary | ICD-10-CM | POA: Diagnosis present

## 2023-09-01 DIAGNOSIS — L97519 Non-pressure chronic ulcer of other part of right foot with unspecified severity: Secondary | ICD-10-CM | POA: Diagnosis present

## 2023-09-01 DIAGNOSIS — E1142 Type 2 diabetes mellitus with diabetic polyneuropathy: Secondary | ICD-10-CM | POA: Diagnosis present

## 2023-09-01 DIAGNOSIS — I10 Essential (primary) hypertension: Secondary | ICD-10-CM | POA: Diagnosis present

## 2023-09-01 DIAGNOSIS — N179 Acute kidney failure, unspecified: Secondary | ICD-10-CM | POA: Diagnosis present

## 2023-09-01 DIAGNOSIS — Z7982 Long term (current) use of aspirin: Secondary | ICD-10-CM

## 2023-09-01 DIAGNOSIS — R6520 Severe sepsis without septic shock: Principal | ICD-10-CM | POA: Diagnosis present

## 2023-09-01 DIAGNOSIS — D649 Anemia, unspecified: Secondary | ICD-10-CM | POA: Insufficient documentation

## 2023-09-01 DIAGNOSIS — D638 Anemia in other chronic diseases classified elsewhere: Secondary | ICD-10-CM | POA: Diagnosis present

## 2023-09-01 DIAGNOSIS — Z89431 Acquired absence of right foot: Secondary | ICD-10-CM

## 2023-09-01 DIAGNOSIS — E1165 Type 2 diabetes mellitus with hyperglycemia: Secondary | ICD-10-CM | POA: Diagnosis not present

## 2023-09-01 DIAGNOSIS — E871 Hypo-osmolality and hyponatremia: Secondary | ICD-10-CM | POA: Diagnosis present

## 2023-09-01 DIAGNOSIS — G2581 Restless legs syndrome: Secondary | ICD-10-CM | POA: Diagnosis present

## 2023-09-01 DIAGNOSIS — Z8701 Personal history of pneumonia (recurrent): Secondary | ICD-10-CM

## 2023-09-01 DIAGNOSIS — R053 Chronic cough: Secondary | ICD-10-CM | POA: Diagnosis present

## 2023-09-01 DIAGNOSIS — R7989 Other specified abnormal findings of blood chemistry: Secondary | ICD-10-CM | POA: Insufficient documentation

## 2023-09-01 DIAGNOSIS — K219 Gastro-esophageal reflux disease without esophagitis: Secondary | ICD-10-CM | POA: Diagnosis present

## 2023-09-01 DIAGNOSIS — E11319 Type 2 diabetes mellitus with unspecified diabetic retinopathy without macular edema: Secondary | ICD-10-CM | POA: Diagnosis present

## 2023-09-01 DIAGNOSIS — Z91013 Allergy to seafood: Secondary | ICD-10-CM

## 2023-09-01 DIAGNOSIS — R052 Subacute cough: Secondary | ICD-10-CM | POA: Insufficient documentation

## 2023-09-01 DIAGNOSIS — Z794 Long term (current) use of insulin: Secondary | ICD-10-CM

## 2023-09-01 DIAGNOSIS — E872 Acidosis, unspecified: Secondary | ICD-10-CM | POA: Diagnosis present

## 2023-09-01 DIAGNOSIS — Z880 Allergy status to penicillin: Secondary | ICD-10-CM

## 2023-09-01 DIAGNOSIS — D509 Iron deficiency anemia, unspecified: Secondary | ICD-10-CM | POA: Diagnosis present

## 2023-09-01 DIAGNOSIS — K760 Fatty (change of) liver, not elsewhere classified: Secondary | ICD-10-CM | POA: Diagnosis present

## 2023-09-01 DIAGNOSIS — E11621 Type 2 diabetes mellitus with foot ulcer: Secondary | ICD-10-CM | POA: Diagnosis present

## 2023-09-01 DIAGNOSIS — L089 Local infection of the skin and subcutaneous tissue, unspecified: Secondary | ICD-10-CM | POA: Insufficient documentation

## 2023-09-01 DIAGNOSIS — Z89511 Acquired absence of right leg below knee: Secondary | ICD-10-CM

## 2023-09-01 DIAGNOSIS — A419 Sepsis, unspecified organism: Principal | ICD-10-CM | POA: Diagnosis present

## 2023-09-01 DIAGNOSIS — Z89421 Acquired absence of other right toe(s): Secondary | ICD-10-CM

## 2023-09-01 LAB — COMPREHENSIVE METABOLIC PANEL
ALT: 70 U/L — ABNORMAL HIGH (ref 0–44)
AST: 105 U/L — ABNORMAL HIGH (ref 15–41)
Albumin: 2.2 g/dL — ABNORMAL LOW (ref 3.5–5.0)
Alkaline Phosphatase: 245 U/L — ABNORMAL HIGH (ref 38–126)
Anion gap: 13 (ref 5–15)
BUN: 16 mg/dL (ref 6–20)
CO2: 28 mmol/L (ref 22–32)
Calcium: 8.6 mg/dL — ABNORMAL LOW (ref 8.9–10.3)
Chloride: 88 mmol/L — ABNORMAL LOW (ref 98–111)
Creatinine, Ser: 1.37 mg/dL — ABNORMAL HIGH (ref 0.61–1.24)
GFR, Estimated: >60 mL/min (ref >60)
Glucose, Bld: 118 mg/dL — ABNORMAL HIGH (ref 70–99)
Potassium: 3.8 mmol/L (ref 3.5–5.1)
Sodium: 129 mmol/L — ABNORMAL LOW (ref 135–145)
Total Bilirubin: 1.2 mg/dL — ABNORMAL HIGH (ref <1.2)
Total Protein: 6.7 g/dL (ref 6.5–8.1)

## 2023-09-01 LAB — I-STAT CG4 LACTIC ACID, ED
Lactic Acid, Venous: 2.8 mmol/L (ref 0.5–1.9)
Lactic Acid, Venous: 1 mmol/L (ref 0.5–1.9)

## 2023-09-01 LAB — CBC WITH DIFFERENTIAL/PLATELET
Abs Immature Granulocytes: 0 10*3/uL (ref 0.00–0.07)
Basophils Absolute: 0 10*3/uL (ref 0.0–0.1)
Basophils Relative: 0 %
Eosinophils Absolute: 0.3 10*3/uL (ref 0.0–0.5)
Eosinophils Relative: 1 %
HCT: 28.8 % — ABNORMAL LOW (ref 39.0–52.0)
Hemoglobin: 9.1 g/dL — ABNORMAL LOW (ref 13.0–17.0)
Lymphocytes Relative: 6 %
Lymphs Abs: 1.6 10*3/uL (ref 0.7–4.0)
MCH: 25.3 pg — ABNORMAL LOW (ref 26.0–34.0)
MCHC: 31.6 g/dL (ref 30.0–36.0)
MCV: 80.2 fL (ref 80.0–100.0)
Monocytes Absolute: 0.8 10*3/uL (ref 0.1–1.0)
Monocytes Relative: 3 %
Neutro Abs: 23.3 10*3/uL — ABNORMAL HIGH (ref 1.7–7.7)
Neutrophils Relative %: 90 %
Platelets: 518 10*3/uL — ABNORMAL HIGH (ref 150–400)
RBC: 3.59 MIL/uL — ABNORMAL LOW (ref 4.22–5.81)
RDW: 14.4 % (ref 11.5–15.5)
WBC: 25.9 10*3/uL — ABNORMAL HIGH (ref 4.0–10.5)
nRBC: 0 % (ref 0.0–0.2)
nRBC: 0 /100{WBCs}

## 2023-09-01 LAB — CBG MONITORING, ED: Glucose-Capillary: 113 mg/dL — ABNORMAL HIGH (ref 70–99)

## 2023-09-01 LAB — GLUCOSE, CAPILLARY
Glucose-Capillary: 167 mg/dL — ABNORMAL HIGH (ref 70–99)
Glucose-Capillary: 198 mg/dL — ABNORMAL HIGH (ref 70–99)

## 2023-09-01 MED ORDER — PIPERACILLIN-TAZOBACTAM 3.375 G IVPB 30 MIN
3.3750 g | Freq: Once | INTRAVENOUS | Status: AC
  Administered 2023-09-01: 3.375 g via INTRAVENOUS
  Filled 2023-09-01: qty 50

## 2023-09-01 MED ORDER — LACTATED RINGERS IV BOLUS
2000.0000 mL | Freq: Once | INTRAVENOUS | Status: AC
  Administered 2023-09-01: 2000 mL via INTRAVENOUS

## 2023-09-01 MED ORDER — ACETAMINOPHEN 325 MG PO TABS
650.0000 mg | ORAL_TABLET | Freq: Four times a day (QID) | ORAL | Status: DC | PRN
  Administered 2023-09-01: 650 mg via ORAL
  Filled 2023-09-01: qty 2

## 2023-09-01 MED ORDER — VANCOMYCIN HCL 1500 MG/300ML IV SOLN
1500.0000 mg | Freq: Once | INTRAVENOUS | Status: AC
  Administered 2023-09-01: 1500 mg via INTRAVENOUS
  Filled 2023-09-01: qty 300

## 2023-09-01 MED ORDER — ATORVASTATIN CALCIUM 40 MG PO TABS
40.0000 mg | ORAL_TABLET | Freq: Every day | ORAL | Status: DC
  Administered 2023-09-02 – 2023-09-05 (×4): 40 mg via ORAL
  Filled 2023-09-01 (×4): qty 1

## 2023-09-01 MED ORDER — LACTATED RINGERS IV BOLUS
1000.0000 mL | Freq: Once | INTRAVENOUS | Status: AC
  Administered 2023-09-01: 1000 mL via INTRAVENOUS

## 2023-09-01 MED ORDER — INSULIN GLARGINE-YFGN 100 UNIT/ML ~~LOC~~ SOLN
25.0000 [IU] | Freq: Every day | SUBCUTANEOUS | Status: DC
  Administered 2023-09-01 – 2023-09-02 (×2): 25 [IU] via SUBCUTANEOUS
  Filled 2023-09-01 (×6): qty 0.25

## 2023-09-01 MED ORDER — INSULIN ASPART 100 UNIT/ML IJ SOLN
0.0000 [IU] | Freq: Three times a day (TID) | INTRAMUSCULAR | Status: DC
  Administered 2023-09-01 – 2023-09-02 (×2): 3 [IU] via SUBCUTANEOUS

## 2023-09-01 MED ORDER — ACETAMINOPHEN 500 MG PO TABS
1000.0000 mg | ORAL_TABLET | Freq: Once | ORAL | Status: AC
  Administered 2023-09-01: 1000 mg via ORAL
  Filled 2023-09-01: qty 2

## 2023-09-01 MED ORDER — TRAMADOL HCL 50 MG PO TABS
50.0000 mg | ORAL_TABLET | Freq: Two times a day (BID) | ORAL | Status: DC | PRN
  Administered 2023-09-01: 50 mg via ORAL
  Filled 2023-09-01: qty 1

## 2023-09-01 MED ORDER — ALBUTEROL SULFATE (2.5 MG/3ML) 0.083% IN NEBU: 3.0000 mL | INHALATION_SOLUTION | Freq: Four times a day (QID) | RESPIRATORY_TRACT | Status: DC | PRN

## 2023-09-01 MED ORDER — VANCOMYCIN HCL 1250 MG/250ML IV SOLN
1250.0000 mg | INTRAVENOUS | Status: DC
  Administered 2023-09-02: 1250 mg via INTRAVENOUS
  Filled 2023-09-01 (×2): qty 250

## 2023-09-01 MED ORDER — FAMOTIDINE 20 MG PO TABS
40.0000 mg | ORAL_TABLET | Freq: Two times a day (BID) | ORAL | Status: DC
Start: 2023-09-01 — End: 2023-09-05
  Administered 2023-09-01 – 2023-09-05 (×8): 40 mg via ORAL
  Filled 2023-09-01 (×8): qty 2

## 2023-09-01 MED ORDER — SODIUM CHLORIDE 0.9 % IV SOLN
1.5000 g | INTRAVENOUS | Status: AC
  Administered 2023-09-02: 1.5 g via INTRAVENOUS
  Filled 2023-09-01 (×2): qty 1.5

## 2023-09-01 MED ORDER — PIPERACILLIN-TAZOBACTAM 3.375 G IVPB
3.3750 g | Freq: Three times a day (TID) | INTRAVENOUS | Status: DC
  Administered 2023-09-01 – 2023-09-03 (×6): 3.375 g via INTRAVENOUS
  Filled 2023-09-01 (×6): qty 50

## 2023-09-01 NOTE — ED Notes (Signed)
ED TO INPATIENT HANDOFF REPORT  ED Nurse Name and Phone #: Cherylin Waguespack [phone]  S Name/Age/Gender Sean Carter 52 y.o. male Room/Bed: 037C/037C  Code Status   Code Status: Full Code  Home/SNF/Other Home Patient oriented to: self, place, time, and situation Is this baseline? Yes   Triage Complete: Triage complete  Chief Complaint Sepsis [facility]) [A41.9]  Triage Note Pt. Stated, I was suppose to come here for a right partial amputee cause I have an infection a ulcer at the bottom of my foot. Ive not been able to eat or drink off and on for 4 weeks. I was told to come here by Dr. Gilmer Mor to have the surgery in this group.   Allergies Allergies  Allergen Reactions   Scallops [Shellfish Allergy] Anaphylaxis   Penicillins Other (See Comments)    Unknown reaction as a child    Level of Care/Admitting Diagnosis ED Disposition     ED Disposition  Admit   Condition  --   Comment  Hospital Area: [facility] [[id number]]  Level of Care: Med-Surg [16]  May admit patient to [facility] or [facility] if equivalent level of care is available:: No  Covid Evaluation: Asymptomatic - no recent exposure (last 10 days) testing not required  Diagnosis: Sepsis [facility]) [[id number]]  Admitting Physician: Celine Mans [[id number]]  Attending Physician: Carney Living [id number]  Certification:: I certify this patient will need inpatient services for at least 2 midnights  Expected Medical Readiness: 09/06/2023          B Medical/Surgery History Past Medical History:  Diagnosis Date   Blurry vision 07/10/2020   Diabetes mellitus without complication ([name])    Gangrene of toe of left foot ([name])    Sepsis due to Streptococcus, group B ([name]) 06/20/2020   Streptococcal bacteremia    Past Surgical History:  Procedure Laterality Date   ABDOMINAL AORTOGRAM W/LOWER EXTREMITY N/A 01/18/2023   Procedure: ABDOMINAL AORTOGRAM W/LOWER EXTREMITY;  Surgeon: Chuck Hint, MD;  Location: [facility]  INVASIVE CV LAB;  Service: Cardiovascular;  Laterality: N/A;   AMPUTATION Left 05/31/2020   Procedure: Ray Amputation of Left Great Toe with Negative Pressure Vac Placement;  Surgeon: Cephus Shelling, MD;  Location: [facility] OR;  Service: Vascular;  Laterality: Left;   AMPUTATION Right 07/22/2022   Procedure: RIGHT FIRST AND SECOND RAY PARTIAL  AMPUTATION;  Surgeon: Leonie Douglas, MD;  Location: [facility] OR;  Service: Vascular;  Laterality: Right;   APPLICATION OF WOUND VAC Right 01/22/2023   Procedure: APPLICATION OF WOUND VAC, RIGHT FOOT;  Surgeon: Maeola Harman, MD;  Location: [facility] OR;  Service: Vascular;  Laterality: Right;   INCISION AND DRAINAGE OF WOUND Right 01/22/2023   Procedure: WASHOUT OF RIGHT FOOT WOUND;  Surgeon: Maeola Harman, MD;  Location: [facility] OR;  Service: Vascular;  Laterality: Right;   PERIPHERAL VASCULAR BALLOON ANGIOPLASTY  01/18/2023   Procedure: PERIPHERAL VASCULAR BALLOON ANGIOPLASTY;  Surgeon: Chuck Hint, MD;  Location: [facility] INVASIVE CV LAB;  Service: Cardiovascular;;   STUMP REVISION Right 01/22/2023   Procedure: PARTIAL CLOSURE OF RIGHT FOOT WOUND;  Surgeon: Maeola Harman, MD;  Location: [facility] OR;  Service: Vascular;  Laterality: Right;   TRANSMETATARSAL AMPUTATION Right 01/19/2023   Procedure: TRANSMETATARSAL AMPUTATION;  Surgeon: Maeola Harman, MD;  Location: [facility] OR;  Service: Vascular;  Laterality: Right;     A IV Location/Drains/Wounds Patient Lines/Drains/Airways Status     Active Line/Drains/Airways     Name Placement date Placement  time Site Days   Peripheral IV 09/01/23 20 G Left Antecubital 09/01/23  0904  Antecubital  less than 1   Peripheral IV 09/01/23 20 G Distal;Posterior;Right Forearm 09/01/23  0928  Forearm  less than 1   Wound / Incision (Open or Dehisced) 01/15/23 Non-pressure wound Foot Right 01/15/23  2309  Foot  229            Intake/Output Last 24 hours No intake or output data in the 24 hours  ending 09/01/23 1132  Labs/Imaging Results for orders placed or performed during the hospital encounter of 09/01/23 (from the past 48 hour(s))  Comprehensive metabolic panel     Status: Abnormal   Collection Time: 09/01/23  7:38 AM  Result Value Ref Range   Sodium 129 (L) 135 - 145 mmol/L   Potassium 3.8 3.5 - 5.1 mmol/L   Chloride 88 (L) 98 - 111 mmol/L   CO2 28 22 - 32 mmol/L   Glucose, Bld 118 (H) 70 - 99 mg/dL    Comment: Glucose reference range applies only to samples taken after fasting for at least 8 hours.   BUN 16 6 - 20 mg/dL   Creatinine, Ser 1.91 (H) 0.61 - 1.24 mg/dL   Calcium 8.6 (L) 8.9 - 10.3 mg/dL   Total Protein 6.7 6.5 - 8.1 g/dL   Albumin 2.2 (L) 3.5 - 5.0 g/dL   AST 478 (H) 15 - 41 U/L   ALT 70 (H) 0 - 44 U/L   Alkaline Phosphatase 245 (H) 38 - 126 U/L   Total Bilirubin 1.2 (H) <1.2 mg/dL   GFR, Estimated >29 >56 mL/min    Comment: (NOTE) Calculated using the CKD-EPI Creatinine Equation (2021)    Anion gap 13 5 - 15    Comment: Performed at [facility], [address], [place], [place] [address]  CBC with Differential     Status: Abnormal   Collection Time: 09/01/23  7:38 AM  Result Value Ref Range   WBC 25.9 (H) 4.0 - 10.5 K/uL   RBC 3.59 (L) 4.22 - 5.81 MIL/uL   Hemoglobin 9.1 (L) 13.0 - 17.0 g/dL   HCT 65.7 (L) 84.6 - 96.2 %   MCV 80.2 80.0 - 100.0 fL   MCH 25.3 (L) 26.0 - 34.0 pg   MCHC 31.6 30.0 - 36.0 g/dL   RDW 95.2 84.1 - 32.4 %   Platelets 518 (H) 150 - 400 K/uL   nRBC 0.0 0.0 - 0.2 %   Neutrophils Relative % 90 %   Neutro Abs 23.3 (H) 1.7 - 7.7 K/uL   Lymphocytes Relative 6 %   Lymphs Abs 1.6 0.7 - 4.0 K/uL   Monocytes Relative 3 %   Monocytes Absolute 0.8 0.1 - 1.0 K/uL   Eosinophils Relative 1 %   Eosinophils Absolute 0.3 0.0 - 0.5 K/uL   Basophils Relative 0 %   Basophils Absolute 0.0 0.0 - 0.1 K/uL   WBC Morphology See Note     Comment: Dohle Bodies   nRBC 0 0 /100 WBC   Abs Immature Granulocytes 0.00 0.00 - 0.07 K/uL    Polychromasia PRESENT     Comment: Performed at [facility], [address], [place], [place] [address]  I-Stat Lactic Acid, ED     Status: Abnormal   Collection Time: 09/01/23  7:59 AM  Result Value Ref Range   Lactic Acid, Venous 2.8 (HH) 0.5 - 1.9 mmol/L   Comment NOTIFIED PHYSICIAN   I-Stat  Lactic Acid, ED     Status: None   Collection Time: 09/01/23 11:13 AM  Result Value Ref Range   Lactic Acid, Venous 1.0 0.5 - 1.9 mmol/L   No results found.  Pending Labs Unresulted Labs (From admission, onward)     Start     Ordered   09/02/23 0500  HIV Antibody (routine testing w rflx)  (HIV Antibody (Routine testing w reflex) panel)  Tomorrow morning,   R        09/01/23 1045   09/02/23 0500  Comprehensive metabolic panel  Tomorrow morning,   R        09/01/23 1045   09/02/23 0500  CBC  Tomorrow morning,   R        09/01/23 1045   09/01/23 0819  Blood culture (routine x 2)  BLOOD CULTURE X 2,   R (with STAT occurrences)      09/01/23 0820   09/01/23 0725  Urinalysis, w/ Reflex to Culture (Infection Suspected) -Urine, Random  Once,   URGENT       Question:  Specimen Source  Answer:  Urine, Random   09/01/23 0724            Vitals/Pain Today's Vitals   09/01/23 1030 09/01/23 1045 09/01/23 1053 09/01/23 1100  BP: 120/70 (!) 104/54    Pulse: 82 84    Resp:  20    Temp:   99.2 F (37.3 C)   TempSrc:   Oral   SpO2: 95% 99%    Weight:    93 kg  Height:    5\' 8"  (1.727 m)  PainSc:        Isolation Precautions No active isolations  Medications Medications  vancomycin (VANCOREADY) IVPB 1500 mg/300 mL (1,500 mg Intravenous New Bag/Given 09/01/23 1050)  cefUROXime (ZINACEF) 1.5 g in sodium chloride 0.9 % 100 mL IVPB (has no administration in time range)  acetaminophen (TYLENOL) tablet 650 mg (has no administration in time range)  traMADol (ULTRAM) tablet 50 mg (has no administration in time range)  atorvastatin (LIPITOR) tablet 40 mg (has no administration in time  range)  famotidine (PEPCID) tablet 40 mg (has no administration in time range)  albuterol (PROVENTIL) (2.5 MG/3ML) 0.083% nebulizer solution 3 mL (has no administration in time range)  insulin glargine-yfgn (SEMGLEE) injection 25 Units (has no administration in time range)  insulin aspart (novoLOG) injection 0-15 Units (has no administration in time range)  piperacillin-tazobactam (ZOSYN) IVPB 3.375 g (0 g Intravenous Stopped 09/01/23 0959)  lactated ringers bolus 2,000 mL (2,000 mLs Intravenous New Bag/Given 09/01/23 0908)  acetaminophen (TYLENOL) tablet 1,000 mg (1,000 mg Oral Given 09/01/23 0848)    Mobility walks     Focused Assessments Musculoskeletal    R Recommendations: See Admitting Provider Note  Report given to:   Additional Notes:

## 2023-09-01 NOTE — H&P (Addendum)
Hospital Admission History and Physical Service Pager: [phone]  Patient name: DREYTON SESLER Medical record number: [id number] Date of Birth: November 27, 1970 Age: 52 y.o. Gender: male  Primary Care Provider: Bess Kinds, MD Consultants: Vascular surgery Code Status: Full, which was confirmed with family if patient unable to confirm   Preferred Emergency Contact:  Contact Information     Name Relation Home Work Mobile   Thompson,Frances Significant other [phone]        Other Contacts   None on File      Chief Complaint: "Feels like shit"  Assessment and Plan: ELONZO VILLAR is a 52 y.o. male presenting with sepsis secondary to known diabetic foot ulcer. Differential for this patient's presentation of this includes other infectious causes including UTI, pneumonia, cellulitis.  Given worsening symptoms and known diabetic fold ulcer it is unlikely that other infections are causing this patient's symptoms.  Lab workup thus far supports this conclusion. Assessment & Plan Sepsis with acute organ dysfunction without septic shock, due to unspecified organism, unspecified organ dysfunction type ([name]) Febrile, vital signs otherwise stable.  Significantly elevated white blood cell count and mildly elevated lactic acid.  Suspect sepsis will resolve with antibiotic treatment and fluid resuscitation. -Admit to family medicine teaching service, attending Dr. Deirdre Priest, MedSurg -Vascular surgery consulted, recommendations appreciated -Empiric cefuroxime, vancomycin, Zosyn -Stop antibiotics after BKA -Status post 2 L LR bolus, repeat 1L bolus -Trend lactic acid -AM BMP, CMP -N.p.o. at midnight -Vitals per floor -Start DVT prophylaxis after surgery -Follow-up chest x-ray, UA AKI (acute kidney injury) ([name]) Mildly increased creatinine to 1.37 from a baseline of 0.9-1.0.  Suspect this is prerenal in the setting of sepsis.  Likely also component of dehydration, no indications of ATN  contributing at this time.  Expect this will improve with fluid resuscitation above. -Repeat 1L LR bolus -Follow-up UA Type 2 diabetes mellitus with diabetic polyneuropathy, with long-term current use of insulin ([name]) Last A1c 8.4.  Home regimen 35 units Basaglar daily, 12 to 14 units Humalog with meals. -Restarted LAI at 25 units daily, adjust as indicated -CBG checks with meals and at night -Moderate sliding scale -May need to add back meal coverage Elevated LFTs Suspect this is transient hepatitis due to sepsis.  Will trend, expect will improve. -AM CMP -If not improving can consider hepatitis panel and right upper quadrant ultrasound Subacute cough Visit with Dr. Claudean Severance on 08/26/2023 unlikely to be community-acquired pneumonia but covered with Omnicef and doxycycline. -Antitussives as needed -Antibiotic regimen as above -Follow-up chest x-ray Normocytic anemia Hgb 9.1, MCV 80.2. Per my chart has not had outpatient work-up since 2021. Will repeat iron studies. Has not had colonoscopy. Suspect ACD due to chronic diabetic skin infections.  -Ferritin, TIBC, Iron -Outpatient colonoscopy   Chronic and Stable Conditions: Restrictive lung disease - persistently noted throughout chart based on PFT function.  Per my limited review has not seen pulmonology.  Likely needs outpatient follow-up.  Will continue home inhaler regimen, Singulair, Zyrtec as needed in the hospital. Restless leg - continue home Requip 1 mg daily  FEN/GI: N.p.o. at midnight VTE Prophylaxis: Left leg SCD, restart DVT prophylaxis after surgery  Disposition: Med-Surg  History of Present Illness:  KANON RAUSCH is a 52 y.o. male presenting with fatigue and weakness.  Patient reports that he has been seen twice in the past week with his PCP provider for infected diabetic foot wound.  Per my chart review it was recommended that he go to the  ED for admission on Monday, 08/30/2023.  Patient reports that he has continued to  feel worse and worse with subjective chills at home and overall feeling very tired and weak.  He notes 1 episode of vomiting on last Friday, no abdominal pain, chest pain, difficulty breathing.  He does endorse significant pain in his right foot where the ulcer is.  Notes that he has been walking on it still.  Notes worsening pain in his foot as well as foul smell.  No known fevers at home.  Follows with vascular surgery Dr. Randie Heinz.  Had surgery scheduled for Friday for BKA for this ulcer.  Of note from patient clinic visit on Friday he was prescribed Omnicef and doxycycline for possible community-acquired pneumonia.  He has been taking these as prescribed but has not completed the course.   In the ED, patient was febrile to 100.8, vitals were otherwise stable.  Labs are notable for creatinine of 1.37, AST/ALT 105/70 respectively, alk phos 245, white blood cell count 25.9, lactic acid 2.8. Patient was started on empiric cefuroxime, vancomycin, and Zosyn.  Patient was given 2 L bolus of LR.  Vascular surgery was consulted and recommended for BKA tomorrow.  Review Of Systems: Per HPI.  Pertinent Past Medical History: Type 2 diabetes Hypertension Diabetic retinopathy GERD Restrictive lung disease Anemia  Remainder reviewed in history tab.   Pertinent Past Surgical History: Multiple toe amputations and stump revision.  Remainder reviewed in history tab.  Pertinent Social History: Tobacco use: No Alcohol use: No Other Substance use: No Lives with partner  Pertinent Family History: Reviewed.  Important Outpatient Medications: Tylenol Albuterol Amlodipine Aspirin Lipitor Omnicef Zyrtec Benadryl Doxycycline Pepcid Hydrochlorothiazide 35 units Basaglar daily 10-12 units with meals Humalog Singulair Requip Tramadol Remainder reviewed in medication history.   Objective: BP 139/68 (BP Location: Right Arm)   Pulse (!) 102   Temp (!) 100.8 F (38.2 C) (Oral)   Resp 20   SpO2  97%  Exam: General: A&O, NAD, lying comfortably in hospital bed HEENT: No sign of trauma, EOM grossly intact, dry moist mucous membranes Cardiac: RRR, no m/r/g Respiratory: CTAB, normal WOB, no w/c/r GI: Soft, NTTP, non-distended, protuberant, no rebound or guarding Extremities: NTTP, no peripheral edema, right dorsalis pedis pulse 2+, right LUE bandaged, ulcer present on dorsal midfoot Neuro: Moves all four extremities appropriately. Psych: Appropriate mood and affect   Labs:  CBC BMET  Recent Labs  Lab 09/01/23 0738  WBC 25.9*  HGB 9.1*  HCT 28.8*  PLT 518*   Recent Labs  Lab 09/01/23 0738  NA 129*  K 3.8  CL 88*  CO2 28  BUN 16  CREATININE 1.37*  GLUCOSE 118*  CALCIUM 8.6*     LA - 2.8  Blood cultures pending  EKG: Sinus rhythm   Imaging Studies Performed:  CXR - Pending  DG Foot - Read Pending, I have reviewed and note extensive plantar soft tissue swelling, hypoechoic plantar bony changes consistent with osteomyelitis, calcaneal spurs, midfoot OA, S/p Trans-metatarsal amputation   Celine Mans, MD 09/01/2023, 10:01 AM PGY-2,  Family Medicine  FPTS Intern pager: [phone], text pages welcome Secure chat group [facility] Teaching Service

## 2023-09-01 NOTE — H&P (View-Only) (Signed)
[facility] NOTE   MRN : [id number]  Reason for Consult: non healing right TMA Referring Physician: ED  History of Present Illness: Sean Carter is a 52 y.o. male history of right transmetatarsal amputation after nonhealing toe amputations.  He has been evaluated for the past 2 months with nonhealing wound on the plantar right foot.  He has maintained palpable pedal pulses.  He has been evaluated by Dr. Lajoyce Corners and recommended for amputation. He is followed by [facility] medicine.    He states he still works for a living and he has pain in the right foot everyday.           Current Facility-Administered Medications  Medication Dose Route Frequency Provider Last Rate Last Admin   acetaminophen (TYLENOL) tablet 1,000 mg  1,000 mg Oral Once Countryman, Chase, MD       lactated ringers bolus 2,000 mL  2,000 mL Intravenous Once Countryman, Chase, MD       piperacillin-tazobactam (ZOSYN) IVPB 3.375 g  3.375 g Intravenous Once Glyn Ade, MD       vancomycin (VANCOREADY) IVPB 1500 mg/300 mL  1,500 mg Intravenous Once Glyn Ade, MD       Current Outpatient Medications  Medication Sig Dispense Refill   acetaminophen (TYLENOL) 325 MG tablet Take 2 tablets (650 mg total) by mouth every 6 (six) hours as needed for headache or mild pain. 90 tablet 2   albuterol (VENTOLIN HFA) 108 (90 Base) MCG/ACT inhaler Inhale 2 puffs into the lungs every 6 (six) hours as needed for wheezing or shortness of breath. 8 g 0   amLODipine (NORVASC) 10 MG tablet TAKE 1 TABLET BY MOUTH ONCE DAILY AT BEDTIME 90 tablet 2   aspirin EC 81 MG tablet Take 1 tablet (81 mg total) by mouth daily. Swallow whole. 30 tablet 12   atorvastatin (LIPITOR) 40 MG tablet Take 1 tablet by mouth once daily 90 tablet 1   Blood Gluc Meter Disp-Strips (RELION ALL-IN-ONE) DEVI 1 each by Does not apply route 3 (three) times daily as needed. (Patient not taking: Reported on 07/01/2023) 1 each 3   blood  glucose meter kit and supplies KIT Dispense based on patient and insurance preference. Use up to four times daily as directed. (Patient not taking: Reported on 07/01/2023) 1 each 0   Blood Glucose Monitoring Suppl DEVI 1 each by Does not apply route in the morning, at noon, and at bedtime. May substitute to any manufacturer covered by patient's insurance. (Patient not taking: Reported on 07/01/2023) 1 each 0   brompheniramine-pseudoephedrine-DM 30-2-10 MG/5ML syrup Take 5 mLs by mouth 4 (four) times daily as needed. (Patient not taking: Reported on 07/01/2023) 140 mL 0   cefdinir (OMNICEF) 300 MG capsule Take 1 capsule (300 mg total) by mouth 2 (two) times daily for 10 days. 20 capsule 0   cetirizine (ZYRTEC) 10 MG tablet Take 1 tablet (10 mg total) by mouth daily. 90 tablet 3   Continuous Glucose Sensor (DEXCOM G7 SENSOR) MISC 3 Devices by Does not apply route as directed. Apply new sensor every 10 days     diphenhydrAMINE (BENADRYL) 25 MG tablet Take 25 mg by mouth at bedtime.     doxycycline (VIBRA-TABS) 100 MG tablet Take 1 tablet (100 mg total) by mouth 2 (two) times daily. 20 tablet 0   famotidine (PEPCID) 40 MG tablet Take 1 tablet by mouth twice daily 60 tablet 3   hydrochlorothiazide (HYDRODIURIL) 25 MG tablet Take  1 tablet by mouth once daily 90 tablet 2   Insulin Glargine (BASAGLAR KWIKPEN) 100 UNIT/ML Inject 35 Units into the skin daily. 6 mL 1   insulin lispro (HUMALOG KWIKPEN) 100 UNIT/ML KwikPen Inject 10-12 Units into the skin 3 (three) times daily.     ipratropium (ATROVENT) 0.03 % nasal spray Place 2 sprays into both nostrils every 12 (twelve) hours. 30 mL 12   montelukast (SINGULAIR) 10 MG tablet TAKE 1 TABLET BY MOUTH AT BEDTIME 90 tablet 1   Multiple Vitamin (MULTIVITAMIN) tablet Take 1 tablet by mouth daily. (Patient not taking: Reported on 07/01/2023)     ReliOn Lancets Thin 26G MISC 1 each by Does not apply route 3 (three) times daily as needed. (Patient not taking: Reported on  07/01/2023) 1 each 3   rOPINIRole (REQUIP) 1 MG tablet Take 1 tablet (1 mg total) by mouth at bedtime. 30 tablet 5   traMADol (ULTRAM) 50 MG tablet Take 1 tablet (50 mg total) by mouth every 12 (twelve) hours as needed for severe pain. (Patient not taking: Reported on 07/01/2023) 30 tablet 0   ULTICARE INSULIN SYRINGE 30G X 1/2" 0.3 ML MISC See admin instructions.  (Patient not taking: Reported on 07/01/2023)      Pt meds include: Statin :Yes Betablocker: No ASA: Yes Other anticoagulants/antiplatelets: none  Past Medical History:  Diagnosis Date   Blurry vision 07/10/2020   Diabetes mellitus without complication ([name])    Gangrene of toe of left foot ([name])    Sepsis due to Streptococcus, group B ([name]) 06/20/2020   Streptococcal bacteremia     Past Surgical History:  Procedure Laterality Date   ABDOMINAL AORTOGRAM W/LOWER EXTREMITY N/A 01/18/2023   Procedure: ABDOMINAL AORTOGRAM W/LOWER EXTREMITY;  Surgeon: Chuck Hint, MD;  Location: [facility] INVASIVE CV LAB;  Service: Cardiovascular;  Laterality: N/A;   AMPUTATION Left 05/31/2020   Procedure: Ray Amputation of Left Great Toe with Negative Pressure Vac Placement;  Surgeon: Cephus Shelling, MD;  Location: [facility] OR;  Service: Vascular;  Laterality: Left;   AMPUTATION Right 07/22/2022   Procedure: RIGHT FIRST AND SECOND RAY PARTIAL  AMPUTATION;  Surgeon: Leonie Douglas, MD;  Location: [facility] OR;  Service: Vascular;  Laterality: Right;   APPLICATION OF WOUND VAC Right 01/22/2023   Procedure: APPLICATION OF WOUND VAC, RIGHT FOOT;  Surgeon: Maeola Harman, MD;  Location: [facility] OR;  Service: Vascular;  Laterality: Right;   INCISION AND DRAINAGE OF WOUND Right 01/22/2023   Procedure: WASHOUT OF RIGHT FOOT WOUND;  Surgeon: Maeola Harman, MD;  Location: [facility] OR;  Service: Vascular;  Laterality: Right;   PERIPHERAL VASCULAR BALLOON ANGIOPLASTY  01/18/2023   Procedure: PERIPHERAL VASCULAR BALLOON ANGIOPLASTY;  Surgeon: Chuck Hint, MD;  Location: [facility] INVASIVE CV LAB;  Service: Cardiovascular;;   STUMP REVISION Right 01/22/2023   Procedure: PARTIAL CLOSURE OF RIGHT FOOT WOUND;  Surgeon: Maeola Harman, MD;  Location: [facility] OR;  Service: Vascular;  Laterality: Right;   TRANSMETATARSAL AMPUTATION Right 01/19/2023   Procedure: TRANSMETATARSAL AMPUTATION;  Surgeon: Maeola Harman, MD;  Location: [facility] OR;  Service: Vascular;  Laterality: Right;    Social History Social History   Tobacco Use   Smoking status: Never    Passive exposure: Never   Smokeless tobacco: Never  Vaping Use   Vaping status: Never Used  Substance Use Topics   Drug use: Never    Family History No family history on file.  Allergies  Allergen Reactions   Scallops [  Shellfish Allergy] Anaphylaxis   Penicillins Other (See Comments)    Unknown reaction as a child     REVIEW OF SYSTEMS  General: [ ]  Weight loss, [ ]  Fever, [ ]  chills Neurologic: [ ]  Dizziness, [ ]  Blackouts, [ ]  Seizure [ ]  Stroke, [ ]  "Mini stroke", [ ]  Slurred speech, [ ]  Temporary blindness; [ ]  weakness in arms or legs, [ ]  Hoarseness [ ]  Dysphagia Cardiac: [ ]  Chest pain/pressure, [ ]  Shortness of breath at rest [ ]  Shortness of breath with exertion, [ ]  Atrial fibrillation or irregular heartbeat  Vascular: [ ]  Pain in legs with walking, [ ]  Pain in legs at rest, [ ]  Pain in legs at night,  [x ] Non-healing ulcer, [ ]  Blood clot in vein/DVT,   Pulmonary: [ ]  Home oxygen, [ ]  Productive cough, [ ]  Coughing up blood, [ ]  Asthma,  [ ]  Wheezing [ ]  COPD Musculoskeletal:  [ ]  Arthritis, [ ]  Low back pain, [ ]  Joint pain Hematologic: [ ]  Easy Bruising, [ ]  Anemia; [ ]  Hepatitis Gastrointestinal: [ ]  Blood in stool, [ ]  Gastroesophageal Reflux/heartburn, Urinary: [ ]  chronic Kidney disease, [ ]  on HD - [ ]  MWF or [ ]  TTHS, [ ]  Burning with urination, [ ]  Difficulty urinating Skin: [ ]  Rashes, [x ] Wounds Psychological: [ ]  Anxiety, [ ]   Depression  Physical Examination Vitals:   09/01/23 0706  BP: 139/68  Pulse: (!) 102  Resp: 20  Temp: (!) 100.8 F (38.2 C)  TempSrc: Oral  SpO2: 97%   There is no height or weight on file to calculate BMI.  General:  WDWN in NAD Gait: Normal HENT: WNL Eyes: Pupils equal Pulmonary: normal non-labored breathing , without Rales, rhonchi,  wheezing Cardiac: RRR, without  Murmurs, rubs or gallops; No carotid bruits Abdomen: soft, NT, no masses Skin: no rashes, positive ulcers noted;  no Gangrene , no cellulitis; positive open wounds;   Vascular Exam/Pulses: palpable AT right LE   Musculoskeletal: no muscle wasting or atrophy; no edema  Neurologic: A&O X 3; Appropriate Affect ;  SENSATION: normal; MOTOR FUNCTION: 5/5 Symmetric Speech is fluent/normal   Significant Diagnostic Studies: CBC Lab Results  Component Value Date   WBC 25.9 (H) 09/01/2023   HGB 9.1 (L) 09/01/2023   HCT 28.8 (L) 09/01/2023   MCV 80.2 09/01/2023   PLT 518 (H) 09/01/2023    BMET    Component Value Date/Time   NA 129 (L) 09/01/2023 0738   NA 134 08/26/2023 1545   K 3.8 09/01/2023 0738   CL 88 (L) 09/01/2023 0738   CO2 28 09/01/2023 0738   GLUCOSE 118 (H) 09/01/2023 0738   BUN 16 09/01/2023 0738   BUN 30 (H) 08/26/2023 1545   CREATININE 1.37 (H) 09/01/2023 0738   CREATININE 0.78 07/10/2020 0910   CALCIUM 8.6 (L) 09/01/2023 0738   GFRNONAA >60 09/01/2023 0738   GFRNONAA 106 07/10/2020 0910   GFRAA 123 07/10/2020 0910   Estimated Creatinine Clearance: 69.9 mL/min (A) (by C-G formula based on SCr of 1.37 mg/dL (H)).  COAG Lab Results  Component Value Date   INR 1.1 05/30/2020   INR 0.9 01/10/2008     Non-Invasive Vascular Imaging:  ABI's 6 months ago demonstrated multiphasic inflow with calcified vessels  ASSESSMENT/PLAN:  52 year old male with nonhealing gangrenous plantar diabetic foot ulcer with palpable pulses.  Plan will be BKA during this admission.  He agrees to proceed  with right LE amputation and  his wife is with him.    Admission per Family medicine, labs pending. Will place order for NPO past MN and consent for Dr. Myra Gianotti to perform the amputation.     Sean Carter 09/01/2023 8:38 AM  I agree with the above.  I have seen and evaluated the patient.  He has a nonhealing right foot amputation despite maximal revascularization.  He has not been able to heal this wound for some time.  He is now admitted with sepsis and persistent pain.  The neck step is below-knee amputation on the right.  I discussed the risk of nonhealing and need for conversion to an above-knee amputation.  He understands all these risks and wants to proceed.  He will be n.p.o. after midnight.  Sean Carter

## 2023-09-01 NOTE — Assessment & Plan Note (Signed)
Mildly increased creatinine to 1.37 from a baseline of 0.9-1.0.  Suspect this is prerenal in the setting of sepsis.  Likely also component of dehydration, no indications of ATN contributing at this time.  Expect this will improve with fluid resuscitation above. -Repeat 1L LR bolus -Follow-up UA

## 2023-09-01 NOTE — Consult Note (Addendum)
[facility] NOTE   MRN : [id number]  Reason for Consult: non healing right TMA Referring Physician: ED  History of Present Illness: Sean Carter is a 52 y.o. male history of right transmetatarsal amputation after nonhealing toe amputations.  He has been evaluated for the past 2 months with nonhealing wound on the plantar right foot.  He has maintained palpable pedal pulses.  He has been evaluated by Dr. Lajoyce Corners and recommended for amputation. He is followed by [facility] medicine.    He states he still works for a living and he has pain in the right foot everyday.           Current Facility-Administered Medications  Medication Dose Route Frequency Provider Last Rate Last Admin   acetaminophen (TYLENOL) tablet 1,000 mg  1,000 mg Oral Once Countryman, Chase, MD       lactated ringers bolus 2,000 mL  2,000 mL Intravenous Once Countryman, Chase, MD       piperacillin-tazobactam (ZOSYN) IVPB 3.375 g  3.375 g Intravenous Once Glyn Ade, MD       vancomycin (VANCOREADY) IVPB 1500 mg/300 mL  1,500 mg Intravenous Once Glyn Ade, MD       Current Outpatient Medications  Medication Sig Dispense Refill   acetaminophen (TYLENOL) 325 MG tablet Take 2 tablets (650 mg total) by mouth every 6 (six) hours as needed for headache or mild pain. 90 tablet 2   albuterol (VENTOLIN HFA) 108 (90 Base) MCG/ACT inhaler Inhale 2 puffs into the lungs every 6 (six) hours as needed for wheezing or shortness of breath. 8 g 0   amLODipine (NORVASC) 10 MG tablet TAKE 1 TABLET BY MOUTH ONCE DAILY AT BEDTIME 90 tablet 2   aspirin EC 81 MG tablet Take 1 tablet (81 mg total) by mouth daily. Swallow whole. 30 tablet 12   atorvastatin (LIPITOR) 40 MG tablet Take 1 tablet by mouth once daily 90 tablet 1   Blood Gluc Meter Disp-Strips (RELION ALL-IN-ONE) DEVI 1 each by Does not apply route 3 (three) times daily as needed. (Patient not taking: Reported on 07/01/2023) 1 each 3   blood  glucose meter kit and supplies KIT Dispense based on patient and insurance preference. Use up to four times daily as directed. (Patient not taking: Reported on 07/01/2023) 1 each 0   Blood Glucose Monitoring Suppl DEVI 1 each by Does not apply route in the morning, at noon, and at bedtime. May substitute to any manufacturer covered by patient's insurance. (Patient not taking: Reported on 07/01/2023) 1 each 0   brompheniramine-pseudoephedrine-DM 30-2-10 MG/5ML syrup Take 5 mLs by mouth 4 (four) times daily as needed. (Patient not taking: Reported on 07/01/2023) 140 mL 0   cefdinir (OMNICEF) 300 MG capsule Take 1 capsule (300 mg total) by mouth 2 (two) times daily for 10 days. 20 capsule 0   cetirizine (ZYRTEC) 10 MG tablet Take 1 tablet (10 mg total) by mouth daily. 90 tablet 3   Continuous Glucose Sensor (DEXCOM G7 SENSOR) MISC 3 Devices by Does not apply route as directed. Apply new sensor every 10 days     diphenhydrAMINE (BENADRYL) 25 MG tablet Take 25 mg by mouth at bedtime.     doxycycline (VIBRA-TABS) 100 MG tablet Take 1 tablet (100 mg total) by mouth 2 (two) times daily. 20 tablet 0   famotidine (PEPCID) 40 MG tablet Take 1 tablet by mouth twice daily 60 tablet 3   hydrochlorothiazide (HYDRODIURIL) 25 MG tablet Take  1 tablet by mouth once daily 90 tablet 2   Insulin Glargine (BASAGLAR KWIKPEN) 100 UNIT/ML Inject 35 Units into the skin daily. 6 mL 1   insulin lispro (HUMALOG KWIKPEN) 100 UNIT/ML KwikPen Inject 10-12 Units into the skin 3 (three) times daily.     ipratropium (ATROVENT) 0.03 % nasal spray Place 2 sprays into both nostrils every 12 (twelve) hours. 30 mL 12   montelukast (SINGULAIR) 10 MG tablet TAKE 1 TABLET BY MOUTH AT BEDTIME 90 tablet 1   Multiple Vitamin (MULTIVITAMIN) tablet Take 1 tablet by mouth daily. (Patient not taking: Reported on 07/01/2023)     ReliOn Lancets Thin 26G MISC 1 each by Does not apply route 3 (three) times daily as needed. (Patient not taking: Reported on  07/01/2023) 1 each 3   rOPINIRole (REQUIP) 1 MG tablet Take 1 tablet (1 mg total) by mouth at bedtime. 30 tablet 5   traMADol (ULTRAM) 50 MG tablet Take 1 tablet (50 mg total) by mouth every 12 (twelve) hours as needed for severe pain. (Patient not taking: Reported on 07/01/2023) 30 tablet 0   ULTICARE INSULIN SYRINGE 30G X 1/2" 0.3 ML MISC See admin instructions.  (Patient not taking: Reported on 07/01/2023)      Pt meds include: Statin :Yes Betablocker: No ASA: Yes Other anticoagulants/antiplatelets: none  Past Medical History:  Diagnosis Date   Blurry vision 07/10/2020   Diabetes mellitus without complication ([name])    Gangrene of toe of left foot ([name])    Sepsis due to Streptococcus, group B ([name]) 06/20/2020   Streptococcal bacteremia     Past Surgical History:  Procedure Laterality Date   ABDOMINAL AORTOGRAM W/LOWER EXTREMITY N/A 01/18/2023   Procedure: ABDOMINAL AORTOGRAM W/LOWER EXTREMITY;  Surgeon: Chuck Hint, MD;  Location: [facility] INVASIVE CV LAB;  Service: Cardiovascular;  Laterality: N/A;   AMPUTATION Left 05/31/2020   Procedure: Ray Amputation of Left Great Toe with Negative Pressure Vac Placement;  Surgeon: Cephus Shelling, MD;  Location: [facility] OR;  Service: Vascular;  Laterality: Left;   AMPUTATION Right 07/22/2022   Procedure: RIGHT FIRST AND SECOND RAY PARTIAL  AMPUTATION;  Surgeon: Leonie Douglas, MD;  Location: [facility] OR;  Service: Vascular;  Laterality: Right;   APPLICATION OF WOUND VAC Right 01/22/2023   Procedure: APPLICATION OF WOUND VAC, RIGHT FOOT;  Surgeon: Maeola Harman, MD;  Location: [facility] OR;  Service: Vascular;  Laterality: Right;   INCISION AND DRAINAGE OF WOUND Right 01/22/2023   Procedure: WASHOUT OF RIGHT FOOT WOUND;  Surgeon: Maeola Harman, MD;  Location: [facility] OR;  Service: Vascular;  Laterality: Right;   PERIPHERAL VASCULAR BALLOON ANGIOPLASTY  01/18/2023   Procedure: PERIPHERAL VASCULAR BALLOON ANGIOPLASTY;  Surgeon: Chuck Hint, MD;  Location: [facility] INVASIVE CV LAB;  Service: Cardiovascular;;   STUMP REVISION Right 01/22/2023   Procedure: PARTIAL CLOSURE OF RIGHT FOOT WOUND;  Surgeon: Maeola Harman, MD;  Location: [facility] OR;  Service: Vascular;  Laterality: Right;   TRANSMETATARSAL AMPUTATION Right 01/19/2023   Procedure: TRANSMETATARSAL AMPUTATION;  Surgeon: Maeola Harman, MD;  Location: [facility] OR;  Service: Vascular;  Laterality: Right;    Social History Social History   Tobacco Use   Smoking status: Never    Passive exposure: Never   Smokeless tobacco: Never  Vaping Use   Vaping status: Never Used  Substance Use Topics   Drug use: Never    Family History No family history on file.  Allergies  Allergen Reactions   Scallops [  Shellfish Allergy] Anaphylaxis   Penicillins Other (See Comments)    Unknown reaction as a child     REVIEW OF SYSTEMS  General: [ ]  Weight loss, [ ]  Fever, [ ]  chills Neurologic: [ ]  Dizziness, [ ]  Blackouts, [ ]  Seizure [ ]  Stroke, [ ]  "Mini stroke", [ ]  Slurred speech, [ ]  Temporary blindness; [ ]  weakness in arms or legs, [ ]  Hoarseness [ ]  Dysphagia Cardiac: [ ]  Chest pain/pressure, [ ]  Shortness of breath at rest [ ]  Shortness of breath with exertion, [ ]  Atrial fibrillation or irregular heartbeat  Vascular: [ ]  Pain in legs with walking, [ ]  Pain in legs at rest, [ ]  Pain in legs at night,  [x ] Non-healing ulcer, [ ]  Blood clot in vein/DVT,   Pulmonary: [ ]  Home oxygen, [ ]  Productive cough, [ ]  Coughing up blood, [ ]  Asthma,  [ ]  Wheezing [ ]  COPD Musculoskeletal:  [ ]  Arthritis, [ ]  Low back pain, [ ]  Joint pain Hematologic: [ ]  Easy Bruising, [ ]  Anemia; [ ]  Hepatitis Gastrointestinal: [ ]  Blood in stool, [ ]  Gastroesophageal Reflux/heartburn, Urinary: [ ]  chronic Kidney disease, [ ]  on HD - [ ]  MWF or [ ]  TTHS, [ ]  Burning with urination, [ ]  Difficulty urinating Skin: [ ]  Rashes, [x ] Wounds Psychological: [ ]  Anxiety, [ ]   Depression  Physical Examination Vitals:   09/01/23 0706  BP: 139/68  Pulse: (!) 102  Resp: 20  Temp: (!) 100.8 F (38.2 C)  TempSrc: Oral  SpO2: 97%   There is no height or weight on file to calculate BMI.  General:  WDWN in NAD Gait: Normal HENT: WNL Eyes: Pupils equal Pulmonary: normal non-labored breathing , without Rales, rhonchi,  wheezing Cardiac: RRR, without  Murmurs, rubs or gallops; No carotid bruits Abdomen: soft, NT, no masses Skin: no rashes, positive ulcers noted;  no Gangrene , no cellulitis; positive open wounds;   Vascular Exam/Pulses: palpable AT right LE   Musculoskeletal: no muscle wasting or atrophy; no edema  Neurologic: A&O X 3; Appropriate Affect ;  SENSATION: normal; MOTOR FUNCTION: 5/5 Symmetric Speech is fluent/normal   Significant Diagnostic Studies: CBC Lab Results  Component Value Date   WBC 25.9 (H) 09/01/2023   HGB 9.1 (L) 09/01/2023   HCT 28.8 (L) 09/01/2023   MCV 80.2 09/01/2023   PLT 518 (H) 09/01/2023    BMET    Component Value Date/Time   NA 129 (L) 09/01/2023 0738   NA 134 08/26/2023 1545   K 3.8 09/01/2023 0738   CL 88 (L) 09/01/2023 0738   CO2 28 09/01/2023 0738   GLUCOSE 118 (H) 09/01/2023 0738   BUN 16 09/01/2023 0738   BUN 30 (H) 08/26/2023 1545   CREATININE 1.37 (H) 09/01/2023 0738   CREATININE 0.78 07/10/2020 0910   CALCIUM 8.6 (L) 09/01/2023 0738   GFRNONAA >60 09/01/2023 0738   GFRNONAA 106 07/10/2020 0910   GFRAA 123 07/10/2020 0910   Estimated Creatinine Clearance: 69.9 mL/min (A) (by C-G formula based on SCr of 1.37 mg/dL (H)).  COAG Lab Results  Component Value Date   INR 1.1 05/30/2020   INR 0.9 01/10/2008     Non-Invasive Vascular Imaging:  ABI's 6 months ago demonstrated multiphasic inflow with calcified vessels  ASSESSMENT/PLAN:  52 year old male with nonhealing gangrenous plantar diabetic foot ulcer with palpable pulses.  Plan will be BKA during this admission.  He agrees to proceed  with right LE amputation and  his wife is with him.    Admission per Family medicine, labs pending. Will place order for NPO past MN and consent for Dr. Myra Gianotti to perform the amputation.     Sean Carter 09/01/2023 8:38 AM  I agree with the above.  I have seen and evaluated the patient.  He has a nonhealing right foot amputation despite maximal revascularization.  He has not been able to heal this wound for some time.  He is now admitted with sepsis and persistent pain.  The neck step is below-knee amputation on the right.  I discussed the risk of nonhealing and need for conversion to an above-knee amputation.  He understands all these risks and wants to proceed.  He will be n.p.o. after midnight.  Durene Cal

## 2023-09-01 NOTE — ED Triage Notes (Signed)
Pt. Stated, I was suppose to come here for a right partial amputee cause I have an infection a ulcer at the bottom of my foot. Ive not been able to eat or drink off and on for 4 weeks. I was told to come here by Dr. Gilmer Mor to have the surgery in this group.

## 2023-09-01 NOTE — Assessment & Plan Note (Signed)
Febrile, vital signs otherwise stable.  Significantly elevated white blood cell count and mildly elevated lactic acid.  Suspect sepsis will resolve with antibiotic treatment and fluid resuscitation. -Admit to family medicine teaching service, attending Dr. Deirdre Priest, MedSurg -Vascular surgery consulted, recommendations appreciated -Empiric cefuroxime, vancomycin, Zosyn -Stop antibiotics after BKA -Status post 2 L LR bolus, repeat 1L bolus -Trend lactic acid -AM BMP, CMP -N.p.o. at midnight -Vitals per floor -Start DVT prophylaxis after surgery -Follow-up chest x-ray, UA

## 2023-09-01 NOTE — Progress Notes (Deleted)
  SUBJECTIVE:   CHIEF COMPLAINT / HPI:   Leg Surgery  PERTINENT  PMH / PSH: ***  Past Medical History:  Diagnosis Date   Blurry vision 07/10/2020   Diabetes mellitus without complication ([name])    Gangrene of toe of left foot ([name])    Sepsis due to Streptococcus, group B ([name]) 06/20/2020   Streptococcal bacteremia    OBJECTIVE:  There were no vitals taken for this visit. Physical Exam   ASSESSMENT/PLAN:   Assessment & Plan  No follow-ups on file. Bess Kinds, MD 09/01/2023, 7:30 AM PGY-***, [facility] {    This will disappear when note is signed, click to select method of visit    :1}

## 2023-09-01 NOTE — Consult Note (Signed)
Pharmacy Antibiotic Note  Sean Carter is a 52 y.o. male admitted on 09/01/2023 with sepsis due to right diabetic foot infection with osteomyelitis.  Pharmacy has been consulted for vancomycin & zosyn dosing. Plan is for BKA 11/14. Patient received loading doses of vancomycin and zosyn in the ED.   HPI: Several toe amputations of right foot that have not healed properly. Has been on cefdinir & doxycycline since 11/7.   Plan: Vancomycin 1250 mg IV every 24 hours starting 11/14 at 0800. Goal AUC 400 - 550. Calculated AUC: 488.3 using Scr 1.37 and Vd 0.5 LD: 1,500 mg IV once on 11/13 at 1050   Zosyn 3.375g IV q8h (4 hour infusion).  Follow up post-op with surgery to see further antibiotics once source control achieved.   Height: 5\' 8"  (172.7 cm) Weight: 93 kg (205 lb) IBW/kg (Calculated) : 68.4  Temp (24hrs), Avg:99.7 F (37.6 C), Min:99 F (37.2 C), Max:100.8 F (38.2 C)  Recent Labs  Lab 08/26/23 1545 09/01/23 0738 09/01/23 0759 09/01/23 1113  WBC  --  25.9*  --   --   CREATININE 1.38* 1.37*  --   --   LATICACIDVEN  --   --  2.8* 1.0    Estimated Creatinine Clearance: 69.8 mL/min (A) (by C-G formula based on SCr of 1.37 mg/dL (H)).    Allergies  Allergen Reactions   Scallops [Shellfish Allergy] Anaphylaxis   Penicillins Other (See Comments)    Unknown reaction as a child    Antimicrobials this admission: Zosyn 11/13 >>  Vancomycin 11/13 >>   Dose adjustments this admission: N/A  Microbiology results: 11/13 BCx: pending  Thank you for allowing pharmacy to be a part of this patient's care.  Darolyn Rua, PharmD Student [employer]

## 2023-09-01 NOTE — Plan of Care (Addendum)
FMTS Brief Progress Note  S: Saw patient during night rounds with Dr. Rexene Alberts.  On arrival patient was laying down comfortably in bed.  He said he is doing well with no acute concerns at this time.  Endorses still having pain over his wound which he rates 10 out of 10.  Patient reports he has been having diarrhea within the last few days shortly after starting antibiotics that was provided outpatient.   O: BP 135/68 (BP Location: Right Arm)   Pulse 95   Temp (!) 101.9 F (38.8 C) (Oral)   Resp 18   Ht 5\' 8"  (1.727 m)   Wt 93 kg   SpO2 95%   BMI 31.17 kg/m    General: Alert, well developed, NAD CV: RRR, well-perfused Pulm: CTAB, good WOB on RA, Skin: dry, warm   A/P: Admitted for concerns of sepsis secondary to diabetic foot ulcer.  Already started on broad-spectrum antibiotics and febrile tonight to 101.9 Fahrenheit.  - Rx Tylenol for fever. -Continue to monitor vitals per floor protocol -N.p.o. at midnight in anticipation for surgery - Orders reviewed. Labs for AM ordered, which was adjusted as needed.  -Continue plans per day team.  Diarrhea Suspected to be contributing to antibiotic initiation.  No red flag symptoms or signs of dehydration and vitals currently stable. -Could consider stool pathogen panel if no improvement or worsening symptoms.  Jerre Simon, MD 09/01/2023, 8:59 PM PGY-3, [facility] Night Resident  Please page [phone] with questions.

## 2023-09-01 NOTE — ED Provider Notes (Signed)
[place] EMERGENCY DEPARTMENT AT [facility] Provider Note   CSN: [id number] Arrival date & time: 09/01/23  0865     History Chief Complaint  Patient presents with  . Wound Infection  . Foot Pain  . unable to eat    HPI Sean Carter is a 52 y.o. male presenting for right foot ulcer.  Has a volar right foot ulcer has been present for weeks, follows with vascular surgeon outpatient setting was told that he would likely need an amputation but is been trying conservative care. The whole leg has become edematous and erythematous from the shin down over the last 5 days. Patient endorses fevers for the last week subjective chills nausea vomiting.   Patient's recorded medical, surgical, social, medication list and allergies were reviewed in the Snapshot window as part of the initial history.   Review of Systems   Review of Systems  Constitutional:  Positive for fever. Negative for chills.  HENT:  Positive for congestion. Negative for ear pain and sore throat.   Eyes:  Negative for pain and visual disturbance.  Respiratory:  Negative for cough and shortness of breath.   Cardiovascular:  Negative for chest pain and palpitations.  Gastrointestinal:  Negative for abdominal pain and vomiting.  Genitourinary:  Negative for dysuria and hematuria.  Musculoskeletal:  Negative for arthralgias and back pain.  Skin:  Negative for color change and rash.  Neurological:  Negative for seizures and syncope.  All other systems reviewed and are negative.   Physical Exam Updated Vital Signs BP 139/68 (BP Location: Right Arm)   Pulse (!) 102   Temp (!) 100.8 F (38.2 C) (Oral)   Resp 20   SpO2 97%  Physical Exam Vitals and nursing note reviewed.  Constitutional:      General: He is not in acute distress.    Appearance: He is well-developed.  HENT:     Head: Normocephalic and atraumatic.  Eyes:     Conjunctiva/sclera: Conjunctivae normal.  Cardiovascular:     Rate and Rhythm:  Normal rate and regular rhythm.  Pulmonary:     Effort: Pulmonary effort is normal. No respiratory distress.  Abdominal:     General: Abdomen is flat. There is no distension.  Musculoskeletal:        General: Deformity present. No swelling.  Skin:    General: Skin is warm and dry.     Capillary Refill: Capillary refill takes less than 2 seconds.  Neurological:     Mental Status: He is alert and oriented to person, place, and time. Mental status is at baseline.      ED Course/ Medical Decision Making/ A&P Clinical Course as of 09/01/23 0943  Wed Sep 01, 2023  7846 Pending medicine callback [CC]    Clinical Course User Index [CC] Glyn Ade, MD    Procedures .Critical Care  Performed by: Glyn Ade, MD Authorized by: Glyn Ade, MD   Critical care provider statement:    Critical care time (minutes):  95   Critical care was necessary to treat or prevent imminent or life-threatening deterioration of the following conditions:  Sepsis   Critical care was time spent personally by me on the following activities:  Development of treatment plan with patient or surrogate, discussions with consultants, evaluation of patient's response to treatment, examination of patient, ordering and review of laboratory studies, ordering and review of radiographic studies, ordering and performing treatments and interventions, pulse oximetry, re-evaluation of patient's condition and review of old  charts    Medications Ordered in ED Medications  vancomycin (VANCOREADY) IVPB 1500 mg/300 mL (has no administration in time range)  piperacillin-tazobactam (ZOSYN) IVPB 3.375 g (3.375 g Intravenous New Bag/Given 09/01/23 0929)  cefUROXime (ZINACEF) 1.5 g in sodium chloride 0.9 % 100 mL IVPB (has no administration in time range)  lactated ringers bolus 2,000 mL (2,000 mLs Intravenous New Bag/Given 09/01/23 0908)  acetaminophen (TYLENOL) tablet 1,000 mg (1,000 mg Oral Given 09/01/23 0848)    Medical Decision Making:   Sean Carter is a 52 y.o. male who presented to the ED today with multiple symptoms detailed above.    Additional history discussed with patient's family/caregivers.  Patient placed on continuous vitals and telemetry monitoring while in ED which was reviewed periodically.  Complete initial physical exam performed, notably the patient  was ill-appearing. During this initial exam, patient met criteria for activation of code sepsis due to presence of the following SIRS criteria as well as suspected infectious etiology: tachypnea,tachycardia, fever,triage CBC with leukocytosis. Reviewed and confirmed nursing documentation for past medical history, family history, social history.    Initial Assessment:   With the patient's presentation of signs and symptoms of sepsis, most likely diagnosis is bacteremia secondary to underlying infection.  Considerations for source were initiated including:Urinary tract infections, abdominal infections such as cholecystitis/cholangitis/appendicitis, pulmonary etiology, bacteremia, skin etiology such as cellulitis or fasciitis, neurologic etiology such as meningitis or encephalitis.  This is most consistent with an acute life/limb threatening illness complicated by underlying chronic conditions.  Initial Plan:  Activated hospital protocol code sepsis including blood cultures, lactic acid screening, and further diagnostic care and management. Therapeutically, resuscitation fluids were considered. Patient has no contraindication to fluid resuscitation and therefore 30 cc of IV fluids per kilogram were administered Therapeutically, antibiotics were administered on a broad-spectrum nature. Vanc and zosyn for DFU Screening labs including CBC and Metabolic panel to evaluate for infectious or metabolic etiology of disease.  Urinalysis with reflex culture ordered to evaluate for UTI or relevant urologic/nephrologic pathology.  X-ray for osseous  evaluation Objective evaluation as below reviewed   Initial Study Results:   Laboratory  Positive lactic acid, positive leukocytosis, AKI, mild LFT abnormalities.  EKG EKG was reviewed independently. Rate, rhythm, axis, intervals all examined and without medically relevant abnormality. ST segments without concerns for elevations.    Radiology:  All images reviewed independently. Agree with radiology report at this time.   No results found.    Consults: Case discussed with vascular surgery Dr. Myra Gianotti   Final Assessment and Plan:   Vascular surgery will plan for operative intervention tomorrow.  They recommended medical admission for stabilization.  Treated with vancomycin and piperacillin/tazobactam for broad-spectrum coverage in the setting of acute infection.  Fever treated, blood cultures and code sepsis protocol activated. Patient arranged for admission did not require further intervention while waiting for admission in the emergency room..  Clinical Impression:  1. Sepsis with acute organ dysfunction without septic shock, due to unspecified organism, unspecified organ dysfunction type ([name])      Admit   Final Clinical Impression(s) / ED Diagnoses Final diagnoses:  Sepsis with acute organ dysfunction without septic shock, due to unspecified organism, unspecified organ dysfunction type [facility])    Rx / DC Orders ED Discharge Orders     None         Glyn Ade, MD 09/01/23 [phone]

## 2023-09-01 NOTE — Plan of Care (Signed)

## 2023-09-02 ENCOUNTER — Inpatient Hospital Stay (HOSPITAL_COMMUNITY): Payer: Self-pay | Admitting: Anesthesiology

## 2023-09-02 ENCOUNTER — Encounter (HOSPITAL_COMMUNITY)
Admission: EM | Disposition: A | Discharge status: Home / Self Care | Payer: Self-pay | Source: Home / Self Care | Attending: Family Medicine

## 2023-09-02 ENCOUNTER — Inpatient Hospital Stay (HOSPITAL_COMMUNITY): Payer: Self-pay

## 2023-09-02 ENCOUNTER — Encounter (HOSPITAL_COMMUNITY): Payer: Self-pay | Admitting: Family Medicine

## 2023-09-02 ENCOUNTER — Other Ambulatory Visit: Payer: Self-pay

## 2023-09-02 DIAGNOSIS — T8143XA Infection following a procedure, organ and space surgical site, initial encounter: Secondary | ICD-10-CM

## 2023-09-02 DIAGNOSIS — E119 Type 2 diabetes mellitus without complications: Secondary | ICD-10-CM

## 2023-09-02 DIAGNOSIS — Z794 Long term (current) use of insulin: Secondary | ICD-10-CM

## 2023-09-02 DIAGNOSIS — R052 Subacute cough: Secondary | ICD-10-CM

## 2023-09-02 DIAGNOSIS — R7989 Other specified abnormal findings of blood chemistry: Secondary | ICD-10-CM | POA: Insufficient documentation

## 2023-09-02 DIAGNOSIS — D649 Anemia, unspecified: Secondary | ICD-10-CM | POA: Insufficient documentation

## 2023-09-02 DIAGNOSIS — E1142 Type 2 diabetes mellitus with diabetic polyneuropathy: Secondary | ICD-10-CM

## 2023-09-02 HISTORY — PX: AMPUTATION: SHX166

## 2023-09-02 LAB — TYPE AND SCREEN
ABO/RH(D): A POS
Antibody Screen: NEGATIVE

## 2023-09-02 LAB — COMPREHENSIVE METABOLIC PANEL
ALT: 57 U/L — ABNORMAL HIGH (ref 0–44)
AST: 73 U/L — ABNORMAL HIGH (ref 15–41)
Albumin: 1.8 g/dL — ABNORMAL LOW (ref 3.5–5.0)
Alkaline Phosphatase: 215 U/L — ABNORMAL HIGH (ref 38–126)
Anion gap: 11 (ref 5–15)
BUN: 17 mg/dL (ref 6–20)
CO2: 28 mmol/L (ref 22–32)
Calcium: 8 mg/dL — ABNORMAL LOW (ref 8.9–10.3)
Chloride: 92 mmol/L — ABNORMAL LOW (ref 98–111)
Creatinine, Ser: 0.92 mg/dL (ref 0.61–1.24)
GFR, Estimated: >60 mL/min (ref >60)
Glucose, Bld: 154 mg/dL — ABNORMAL HIGH (ref 70–99)
Potassium: 3.8 mmol/L (ref 3.5–5.1)
Sodium: 131 mmol/L — ABNORMAL LOW (ref 135–145)
Total Bilirubin: 1 mg/dL (ref <1.2)
Total Protein: 5.9 g/dL — ABNORMAL LOW (ref 6.5–8.1)

## 2023-09-02 LAB — HEMOGLOBIN A1C
Hgb A1c MFr Bld: 7.9 % — ABNORMAL HIGH (ref 4.8–5.6)
Mean Plasma Glucose: 180.03 mg/dL

## 2023-09-02 LAB — IRON AND TIBC
Iron: 8 ug/dL — ABNORMAL LOW (ref 45–182)
Saturation Ratios: 6 % — ABNORMAL LOW (ref 17.9–39.5)
TIBC: 127 ug/dL — ABNORMAL LOW (ref 250–450)
UIBC: 119 ug/dL

## 2023-09-02 LAB — CBC
HCT: 25 % — ABNORMAL LOW (ref 39.0–52.0)
Hemoglobin: 7.9 g/dL — ABNORMAL LOW (ref 13.0–17.0)
MCH: 25.4 pg — ABNORMAL LOW (ref 26.0–34.0)
MCHC: 31.6 g/dL (ref 30.0–36.0)
MCV: 80.4 fL (ref 80.0–100.0)
Platelets: 455 10*3/uL — ABNORMAL HIGH (ref 150–400)
RBC: 3.11 MIL/uL — ABNORMAL LOW (ref 4.22–5.81)
RDW: 14.3 % (ref 11.5–15.5)
WBC: 20.6 10*3/uL — ABNORMAL HIGH (ref 4.0–10.5)
nRBC: 0 % (ref 0.0–0.2)

## 2023-09-02 LAB — GLUCOSE, CAPILLARY
Glucose-Capillary: 120 mg/dL — ABNORMAL HIGH (ref 70–99)
Glucose-Capillary: 132 mg/dL — ABNORMAL HIGH (ref 70–99)
Glucose-Capillary: 164 mg/dL — ABNORMAL HIGH (ref 70–99)
Glucose-Capillary: 336 mg/dL — ABNORMAL HIGH (ref 70–99)

## 2023-09-02 LAB — ABO/RH: ABO/RH(D): A POS

## 2023-09-02 LAB — FERRITIN: Ferritin: 628 ng/mL — ABNORMAL HIGH (ref 24–336)

## 2023-09-02 LAB — HIV ANTIBODY (ROUTINE TESTING W REFLEX): HIV Screen 4th Generation wRfx: NONREACTIVE

## 2023-09-02 SURGERY — AMPUTATION BELOW KNEE
Anesthesia: Regional | Site: Knee | Laterality: Right

## 2023-09-02 MED ORDER — MIDAZOLAM HCL 2 MG/2ML IJ SOLN: 2.0000 mg | Freq: Once | INTRAMUSCULAR | Status: AC

## 2023-09-02 MED ORDER — ONDANSETRON HCL 4 MG/2ML IJ SOLN: 4.0000 mg | Freq: Once | INTRAMUSCULAR | Status: DC | PRN

## 2023-09-02 MED ORDER — HYDROMORPHONE HCL 1 MG/ML IJ SOLN: 0.5000 mg | INTRAMUSCULAR | Status: DC | PRN

## 2023-09-02 MED ORDER — OXYCODONE HCL 5 MG PO TABS: 5.0000 mg | ORAL_TABLET | ORAL | Status: DC | PRN

## 2023-09-02 MED ORDER — MIDAZOLAM HCL 2 MG/2ML IJ SOLN
INTRAMUSCULAR | Status: AC
  Filled 2023-09-02: qty 2

## 2023-09-02 MED ORDER — LACTATED RINGERS IV SOLN: INTRAVENOUS | Status: DC

## 2023-09-02 MED ORDER — FENTANYL CITRATE (PF) 250 MCG/5ML IJ SOLN
INTRAMUSCULAR | Status: AC
  Filled 2023-09-02: qty 5

## 2023-09-02 MED ORDER — AMLODIPINE BESYLATE 10 MG PO TABS
10.0000 mg | ORAL_TABLET | Freq: Every day | ORAL | Status: DC
  Filled 2023-09-02: qty 1

## 2023-09-02 MED ORDER — INSULIN ASPART 100 UNIT/ML IJ SOLN
0.0000 [IU] | Freq: Three times a day (TID) | INTRAMUSCULAR | Status: DC
Start: 2023-09-03 — End: 2023-09-05
  Administered 2023-09-03 (×3): 11 [IU] via SUBCUTANEOUS
  Administered 2023-09-04: 2 [IU] via SUBCUTANEOUS
  Administered 2023-09-04 – 2023-09-05 (×3): 3 [IU] via SUBCUTANEOUS

## 2023-09-02 MED ORDER — ONDANSETRON HCL 4 MG/2ML IJ SOLN
INTRAMUSCULAR | Status: AC
  Filled 2023-09-02: qty 2

## 2023-09-02 MED ORDER — LORATADINE 10 MG PO TABS
10.0000 mg | ORAL_TABLET | Freq: Every day | ORAL | Status: DC
  Administered 2023-09-02 – 2023-09-05 (×4): 10 mg via ORAL
  Filled 2023-09-02 (×4): qty 1

## 2023-09-02 MED ORDER — SODIUM CHLORIDE (PF) 0.9 % IJ SOLN
INTRAMUSCULAR | Status: DC | PRN
  Administered 2023-09-02: 70 mL

## 2023-09-02 MED ORDER — 0.9 % SODIUM CHLORIDE (POUR BTL) OPTIME
TOPICAL | Status: DC | PRN
  Administered 2023-09-02: 1000 mL

## 2023-09-02 MED ORDER — INSULIN ASPART 100 UNIT/ML IJ SOLN
0.0000 [IU] | Freq: Every day | INTRAMUSCULAR | Status: DC
  Administered 2023-09-02: 4 [IU] via SUBCUTANEOUS
  Administered 2023-09-03: 2 [IU] via SUBCUTANEOUS

## 2023-09-02 MED ORDER — GUAIFENESIN-DM 100-10 MG/5ML PO SYRP: 5.0000 mL | ORAL_SOLUTION | ORAL | Status: DC | PRN

## 2023-09-02 MED ORDER — INSULIN ASPART 100 UNIT/ML IJ SOLN: 0.0000 [IU] | INTRAMUSCULAR | Status: DC | PRN

## 2023-09-02 MED ORDER — BUPIVACAINE LIPOSOME 1.3 % IJ SUSP
INTRAMUSCULAR | Status: AC
  Filled 2023-09-02: qty 20

## 2023-09-02 MED ORDER — STERILE WATER FOR IRRIGATION IR SOLN
Status: DC | PRN
  Administered 2023-09-02: 1000 mL

## 2023-09-02 MED ORDER — ONDANSETRON HCL 4 MG/2ML IJ SOLN
INTRAMUSCULAR | Status: DC | PRN
  Administered 2023-09-02: 4 mg via INTRAVENOUS

## 2023-09-02 MED ORDER — ROPINIROLE HCL 1 MG PO TABS
1.0000 mg | ORAL_TABLET | Freq: Every day | ORAL | Status: DC
  Filled 2023-09-02 (×4): qty 1

## 2023-09-02 MED ORDER — GLYCOPYRROLATE PF 0.2 MG/ML IJ SOSY
PREFILLED_SYRINGE | INTRAMUSCULAR | Status: AC
  Filled 2023-09-02: qty 1

## 2023-09-02 MED ORDER — PROPOFOL 500 MG/50ML IV EMUL
INTRAVENOUS | Status: DC | PRN
  Administered 2023-09-02: 75 ug/kg/min via INTRAVENOUS
  Administered 2023-09-02: 20 mg via INTRAVENOUS

## 2023-09-02 MED ORDER — ROPIVACAINE HCL 5 MG/ML IJ SOLN
INTRAMUSCULAR | Status: DC | PRN
  Administered 2023-09-02: 10 mL via PERINEURAL
  Administered 2023-09-02: 30 mL via PERINEURAL

## 2023-09-02 MED ORDER — MIDAZOLAM HCL 5 MG/5ML IJ SOLN
INTRAMUSCULAR | Status: DC | PRN
  Administered 2023-09-02: 1 mg via INTRAVENOUS

## 2023-09-02 MED ORDER — PROPOFOL 10 MG/ML IV BOLUS
INTRAVENOUS | Status: AC
  Filled 2023-09-02: qty 20

## 2023-09-02 MED ORDER — CHLORHEXIDINE GLUCONATE 0.12 % MT SOLN: 15.0000 mL | Freq: Once | OROMUCOSAL | Status: AC

## 2023-09-02 MED ORDER — MIDAZOLAM HCL (PF) 10 MG/2ML IJ SOLN
INTRAMUSCULAR | Status: AC
  Filled 2023-09-02: qty 2

## 2023-09-02 MED ORDER — CHLORHEXIDINE GLUCONATE 0.12 % MT SOLN
OROMUCOSAL | Status: AC
  Administered 2023-09-02: 15 mL via OROMUCOSAL
  Filled 2023-09-02: qty 15

## 2023-09-02 MED ORDER — FENTANYL CITRATE (PF) 100 MCG/2ML IJ SOLN: 50.0000 ug | Freq: Once | INTRAMUSCULAR | Status: AC

## 2023-09-02 MED ORDER — LIDOCAINE 2% (20 MG/ML) 5 ML SYRINGE
INTRAMUSCULAR | Status: AC
  Filled 2023-09-02: qty 5

## 2023-09-02 MED ORDER — ORAL CARE MOUTH RINSE: 15.0000 mL | Freq: Once | OROMUCOSAL | Status: AC

## 2023-09-02 MED ORDER — AMISULPRIDE (ANTIEMETIC) 5 MG/2ML IV SOLN: 10.0000 mg | Freq: Once | INTRAVENOUS | Status: DC | PRN

## 2023-09-02 MED ORDER — MONTELUKAST SODIUM 10 MG PO TABS
10.0000 mg | ORAL_TABLET | Freq: Every day | ORAL | Status: DC
Start: 2023-09-02 — End: 2023-09-05
  Administered 2023-09-02 – 2023-09-04 (×3): 10 mg via ORAL
  Filled 2023-09-02 (×3): qty 1

## 2023-09-02 MED ORDER — LIDOCAINE 2% (20 MG/ML) 5 ML SYRINGE
INTRAMUSCULAR | Status: DC | PRN
  Administered 2023-09-02: 20 mg via INTRAVENOUS

## 2023-09-02 MED ORDER — HYDROCHLOROTHIAZIDE 25 MG PO TABS
25.0000 mg | ORAL_TABLET | Freq: Every day | ORAL | Status: DC
  Administered 2023-09-02: 25 mg via ORAL
  Filled 2023-09-02: qty 1

## 2023-09-02 MED ORDER — FENTANYL CITRATE (PF) 100 MCG/2ML IJ SOLN: 25.0000 ug | INTRAMUSCULAR | Status: DC | PRN

## 2023-09-02 MED ORDER — BUPIVACAINE HCL (PF) 0.5 % IJ SOLN
INTRAMUSCULAR | Status: AC
  Filled 2023-09-02: qty 30

## 2023-09-02 MED ORDER — FENTANYL CITRATE (PF) 100 MCG/2ML IJ SOLN
INTRAMUSCULAR | Status: AC
  Administered 2023-09-02: 50 ug via INTRAVENOUS
  Filled 2023-09-02: qty 2

## 2023-09-02 MED ORDER — GLYCOPYRROLATE PF 0.2 MG/ML IJ SOSY
PREFILLED_SYRINGE | INTRAMUSCULAR | Status: DC | PRN
  Administered 2023-09-02 (×2): .1 mg via INTRAVENOUS

## 2023-09-02 MED ORDER — MIDAZOLAM HCL 2 MG/2ML IJ SOLN
INTRAMUSCULAR | Status: AC
  Administered 2023-09-02: 2 mg via INTRAVENOUS
  Filled 2023-09-02: qty 2

## 2023-09-02 SURGICAL SUPPLY — 43 items
BAG COUNTER SPONGE SURGICOUNT (BAG) ×2 IMPLANT
BLADE SAW GIGLI 510 (BLADE) ×2 IMPLANT
BNDG COHESIVE 6X5 TAN ST LF (GAUZE/BANDAGES/DRESSINGS) ×2 IMPLANT
BNDG ELASTIC 4INX 5YD STR LF (GAUZE/BANDAGES/DRESSINGS) IMPLANT
BNDG ELASTIC 6INX 5YD STR LF (GAUZE/BANDAGES/DRESSINGS) IMPLANT
BNDG ELASTIC 6X5.8 VLCR STR LF (GAUZE/BANDAGES/DRESSINGS) ×2 IMPLANT
BNDG GAUZE DERMACEA FLUFF 4 (GAUZE/BANDAGES/DRESSINGS) IMPLANT
CANISTER SUCT 3000ML PPV (MISCELLANEOUS) ×2 IMPLANT
CLIP TI MEDIUM 6 (CLIP) IMPLANT
CNTNR URN SCR LID CUP LEK RST (MISCELLANEOUS) ×2 IMPLANT
CONT SPEC 4OZ STRL OR WHT (MISCELLANEOUS) ×1
COVER SURGICAL LIGHT HANDLE (MISCELLANEOUS) ×2 IMPLANT
DRAPE HALF SHEET 40X57 (DRAPES) ×2 IMPLANT
DRAPE SURG ORHT 6 SPLT 77X108 (DRAPES) ×4 IMPLANT
DRSG ADAPTIC 3X8 NADH LF (GAUZE/BANDAGES/DRESSINGS) IMPLANT
ELECT REM PT RETURN 9FT ADLT (ELECTROSURGICAL) ×1
ELECTRODE REM PT RTRN 9FT ADLT (ELECTROSURGICAL) ×2 IMPLANT
GAUZE SPONGE 4X4 12PLY STRL (GAUZE/BANDAGES/DRESSINGS) ×2 IMPLANT
GLOVE SURG SS PI 7.5 STRL IVOR (GLOVE) ×6 IMPLANT
GOWN STRL REUS W/ TWL LRG LVL3 (GOWN DISPOSABLE) ×4 IMPLANT
GOWN STRL REUS W/ TWL XL LVL3 (GOWN DISPOSABLE) ×2 IMPLANT
GOWN STRL REUS W/TWL LRG LVL3 (GOWN DISPOSABLE) ×3
GOWN STRL REUS W/TWL XL LVL3 (GOWN DISPOSABLE) ×1
KIT BASIN OR (CUSTOM PROCEDURE TRAY) ×2 IMPLANT
KIT TURNOVER KIT B (KITS) ×2 IMPLANT
NDL 18GX1X1/2 (RX/OR ONLY) (NEEDLE) IMPLANT
NEEDLE 18GX1X1/2 (RX/OR ONLY) (NEEDLE) ×1 IMPLANT
NS IRRIG 1000ML POUR BTL (IV SOLUTION) ×2 IMPLANT
PACK GENERAL/GYN (CUSTOM PROCEDURE TRAY) ×2 IMPLANT
PAD ARMBOARD 7.5X6 YLW CONV (MISCELLANEOUS) ×4 IMPLANT
SPONGE T-LAP 18X18 ~~LOC~~+RFID (SPONGE) IMPLANT
STAPLER VISISTAT 35W (STAPLE) ×2 IMPLANT
STOCKINETTE IMPERVIOUS LG (DRAPES) ×2 IMPLANT
SUT SILK 0 TIES 10X30 (SUTURE) ×2 IMPLANT
SUT SILK 2 0 SH CR/8 (SUTURE) ×2 IMPLANT
SUT SILK 3 0 (SUTURE) ×1
SUT SILK 3-0 18XBRD TIE 12 (SUTURE) IMPLANT
SUT VIC AB 2-0 CT1 18 (SUTURE) ×4 IMPLANT
SYR 50ML LL SCALE MARK (SYRINGE) IMPLANT
TAPE UMBILICAL 1/8X30 (MISCELLANEOUS) IMPLANT
TOWEL GREEN STERILE (TOWEL DISPOSABLE) ×4 IMPLANT
UNDERPAD 30X36 HEAVY ABSORB (UNDERPADS AND DIAPERS) ×2 IMPLANT
WATER STERILE IRR 1000ML POUR (IV SOLUTION) ×2 IMPLANT

## 2023-09-02 NOTE — Transfer of Care (Signed)
Immediate Anesthesia Transfer of Care Note  Patient: Philomena Doheny  Procedure(s) Performed: AMPUTATION BELOW KNEE (Right: Knee)  Patient Location: PACU  Anesthesia Type:MAC and Regional  Level of Consciousness: awake, alert , and oriented  Airway & Oxygen Therapy: Patient Spontanous Breathing  Post-op Assessment: Report given to RN and Post -op Vital signs reviewed and stable  Post vital signs: Reviewed and stable  Last Vitals:  Vitals Value Taken Time  BP 106/54   Temp    Pulse 92 09/02/23 1215  Resp 12   SpO2 92% 09/02/23 1215  Vitals shown include unfiled device data.  Last Pain:  Vitals:   09/02/23 0945  TempSrc:   PainSc: 0-No pain         Complications: There were no known notable events for this encounter.

## 2023-09-02 NOTE — Assessment & Plan Note (Addendum)
Hgb 9.1, MCV 80.2. Per my chart has not had outpatient work-up since 2021. Will repeat iron studies. Has not had colonoscopy. Suspect ACD due to chronic diabetic skin infections.  -f/u Ferritin, TIBC, Iron -Outpatient colonoscopy

## 2023-09-02 NOTE — Plan of Care (Addendum)
FMTS Interim Progress Note  Patient seen at bedside for post-op check.   S: Patient doing well following procedure today. No leg pain, chest pain, SOB, headache, or vision changes. No dizziness or lightheadedness. He has been able to eat and drink without issue. No nausea/vomiting/diarrhea.   O: BP 97/75 (BP Location: Right Arm)   Pulse 74   Temp 98.1 F (36.7 C) (Oral)   Resp 16   Ht 5\' 8"  (1.727 m)   Wt 93 kg   SpO2 92%   BMI 31.17 kg/m    General: Well-appearing. Resting comfortably in room. CV: Normal S1/S2. No extra heart sounds. Warm and well-perfused. Pulm: Breathing comfortably on room air. CTAB. No increased WOB. Abd: Soft, non-tender, non-distended. Ext: Wrapped RLE stump. No visible bleeding or drainage. No tenderness to palpation of upper R leg, warm and well-perfused. Non-tender, non-edematous LLE.  Skin:  Warm, dry. Psych: Pleasant and appropriate.   A/P: S/p R BKA Patient appears stable at this time. Vital signs stable. No signs of postsurgical bleeding or drainage. No post-op pain identified. Continue treatment plan as otherwise noted by FMTS team.   Ivery Quale, MD 09/02/2023, 7:10 PM PGY-1, [facility] pager [phone]

## 2023-09-02 NOTE — Assessment & Plan Note (Signed)
Visit with Dr. Claudean Severance on 08/26/2023 unlikely to be community-acquired pneumonia but covered with Omnicef and doxycycline. -Antitussives as needed -Antibiotic regimen as above -Follow-up chest x-ray

## 2023-09-02 NOTE — Anesthesia Preprocedure Evaluation (Addendum)
Anesthesia Evaluation  Patient identified by MRN, date of birth, ID band Patient awake    Reviewed: Allergy & Precautions, NPO status , Patient's Chart, lab work & pertinent test results  Airway Mallampati: III  TM Distance: >3 FB Neck ROM: Full    Dental  (+) Teeth Intact, Dental Advisory Given   Pulmonary neg pulmonary ROS   Pulmonary exam normal breath sounds clear to auscultation       Cardiovascular hypertension, Pt. on medications (-) angina + Peripheral Vascular Disease  (-) Past MI Normal cardiovascular exam Rhythm:Regular Rate:Normal     Neuro/Psych negative neurological ROS  negative psych ROS   GI/Hepatic Neg liver ROS,GERD  Medicated,,  Endo/Other  diabetes, Type 2, Insulin Dependent  Class 3 obesity  Renal/GU Renal disease (AKI)     Musculoskeletal negative musculoskeletal ROS (+)    Abdominal   Peds  Hematology  (+) Blood dyscrasia, anemia   Anesthesia Other Findings Day of surgery medications reviewed with the patient.  Reproductive/Obstetrics                             Anesthesia Physical Anesthesia Plan  ASA: 3  Anesthesia Plan: Regional   Post-op Pain Management: Regional block* and Tylenol PO (pre-op)*   Induction: Intravenous  PONV Risk Score and Plan: 1 and TIVA, Midazolam, Dexamethasone and Ondansetron  Airway Management Planned: Natural Airway and Simple Face Mask  Additional Equipment:   Intra-op Plan:   Post-operative Plan:   Informed Consent: I have reviewed the patients History and Physical, chart, labs and discussed the procedure including the risks, benefits and alternatives for the proposed anesthesia with the patient or authorized representative who has indicated his/her understanding and acceptance.     Dental advisory given  Plan Discussed with: CRNA and Anesthesiologist  Anesthesia Plan Comments:         Anesthesia Quick  Evaluation

## 2023-09-02 NOTE — Assessment & Plan Note (Addendum)
Febrile with leukocytosis to 25.9 and elevated lactic acid (quickly normalized) on admission with R foot ulcer as most likely source. S/p 3L LR bolus and started empiric ABX treatment with Zosyn, cefuroxime, and vanc. Vascular surgery planning for BKA. Fever overnight to 101.9 that improved with tylenol. -f/u vascular surgery recommendations post-op -f/u blood cultures - no growth <24 hours -Adjust pain regimen as needed post-op. Currently:  -tylenol 650mg  q6h PRN for mild pain -tramadol 50mg  q12 PRN for severe pain  -f/u AM CMP, CBC -Vitals per floor -Start DVT prophylaxis after surgery -Follow-up chest x-ray, UA  -PT/OT eval and treat

## 2023-09-02 NOTE — Assessment & Plan Note (Signed)
Last A1c 8.4.  Home regimen 35 units Basaglar daily, 12 to 14 units Humalog with meals. Got 25 units long acting yesterday. -redose long acting insulin post op -CBG checks with meals and at night -Moderate sliding scale -May need to add back meal coverage

## 2023-09-02 NOTE — Progress Notes (Signed)
PT Cancellation Note  Patient Details Name: BREKIN ASEL MRN: [id number] DOB: 02/14/1971   Cancelled Treatment:    Reason Eval/Treat Not Completed: Patient at procedure or test/unavailable  Taken to OR for Rt BKA. Please re-order PT as indicated post-op.  Kathlyn Sacramento, PT, DPT [facility]  Rehabilitation Services Physical Therapist Office: [phone] Website: [email or web address]  Berton Mount 09/02/2023, 8:48 AM

## 2023-09-02 NOTE — Assessment & Plan Note (Addendum)
Per visit with Dr. Claudean Severance on 08/26/2023, pt has intermittent chronic cough with increase over the past month. Unlikely to be community-acquired pneumonia but was treated with Omnicef and doxycycline. PFTs in 11/23 showed mild restrictive lung disease, though this should not cause cough. -Follow-up chest x-ray -Robitussin as needed -Antibiotic regimen as above

## 2023-09-02 NOTE — Progress Notes (Signed)
CCC Pre-op Review  Pre-op checklist:  incomplete at time of note  NPO:   unknown at time of note. Ordered  NPO at [place]  Labs:  11/15 am LABS PENDING  11/14 Lactic acid 2.8*  by iSTAT           WBC 25.9  h/h  9.1 / 28.8  plts 518       Na 129  cl 88 Cr 1.37   Alb 2.2         AST / ALT  105 / 70  Alk Phos 245  Tbil 1.2  Consent: incomplete  H&P:   Vitals:  @0355   t 99.0 p 89  135/77 9 93)  92% RA                    T Max 101.9 O2 requirements:     RA  MAR/PTA review:   Empiric cefuroxime, vancomycin, Zosyn    IV:    20g  RFA   20g L AC  Floor nurse name:    Additional info:   sepsis d/t DM foot ulcer                                   DM 2

## 2023-09-02 NOTE — Anesthesia Procedure Notes (Signed)
Anesthesia Regional Block: Popliteal block   Pre-Anesthetic Checklist: , timeout performed,  Correct Patient, Correct Site, Correct Laterality,  Correct Procedure, Correct Position, site marked,  Risks and benefits discussed,  Surgical consent,  Pre-op evaluation,  At surgeon's request and post-op pain management  Laterality: Right  Prep: chloraprep       Needles:  Injection technique: Single-shot  Needle Type: Echogenic Needle     Needle Length: 9cm  Needle Gauge: 21     Additional Needles:   Procedures:,,,, ultrasound used (permanent image in chart),,    Narrative:  Start time: 09/02/2023 9:34 AM End time: 09/02/2023 9:39 AM Injection made incrementally with aspirations every 5 mL.  Performed by: Personally  Anesthesiologist: Collene Schlichter, MD  Additional Notes: No pain on injection. No increased resistance to injection. Injection made in 5cc increments.  Good needle visualization.  Patient tolerated procedure well.

## 2023-09-02 NOTE — Op Note (Signed)
    Patient name: Sean Carter MRN: [id number] DOB: [birth year] Sex: male  09/02/2023 Pre-operative Diagnosis: Right foot infection Post-operative diagnosis:  Same Surgeon:  Durene Cal Assistants:  Antony Blackbird, PA Procedure:   Right below-knee amputation Anesthesia: Regional Blood Loss:  200 Specimens:  right leg  Findings: Healthy muscle and tissue at the amputation site.  There was a fair amount of edema.  No infection.  Exparel was placed in the subcutaneous tissue and nerve.  Indications: This is a 52 year old gentleman with known right foot infection for which we have been recommending below-knee amputation for some time.  He has been reluctant to proceed.  He presented to the emergency department with worsening pain and sepsis.  He has agreed to right below-knee amputation.  Procedure:  The patient was identified in the holding area and taken to [facility] OR ROOM 16  The patient was then placed supine on the table. regional anesthesia was administered.  The patient was prepped and draped in the usual sterile fashion.  A time out was called and antibiotics were administered.  A PA was necessary to expedite procedure and assist with technical details.  She help with exposure by providing suction and retraction.  She help with wound closure.  A two thirds, one third posterior flap incision was created 13 cm below the tibial tuberosity.  Cautery was used to divide the subcutaneous tissue down to the fascia which was opened with cautery.  I then circumferentially exposed the tibia and fibula.  A periosteal elevator was used to elevate the periosteum.  Next, a Gigli saw was used to transect the tibia, beveling the anterior surface.  Large bone cutters were used to transect the fibula, proximal to the cut edge of the tibia.  A rasp was used to smooth the bone surface.  An amputation knife was then used to divide the remaining muscle.  The leg was then removed as a specimen.  I placed clamps on the  neurovascular bundle.  I then individually isolated the nerve.  I infiltrated with Exparel rel.  It was ligated proximal to the cut edge of the tibia with a 2-0 silk tie.  Similarly, the artery and vein were ligated proximal to the cut edge of the tibia.  There were multiple arterial branches that were suture-ligated with 2-0 silk ties.  The wound was then irrigated.  Hemostasis was achieved.  I did have to remove some additional muscle with cautery in order to close the incision without tension.  The fascia was reapproximated with interrupted 2-0 Vicryl.  The skin was closed with staples.  Exparel was placed in the subcutaneous tissue around the incision.  Sterile dressings were applied.  Patient was extubated taken recovery in stable condition.  There were no immediate complications.   Disposition: To PACU stable.   Juleen China, M.D., [facility] [place] Office: [phone] Pager:  [phone]

## 2023-09-02 NOTE — Anesthesia Procedure Notes (Signed)
Anesthesia Regional Block: Adductor canal block   Pre-Anesthetic Checklist: , timeout performed,  Correct Patient, Correct Site, Correct Laterality,  Correct Procedure, Correct Position, site marked,  Risks and benefits discussed,  Surgical consent,  Pre-op evaluation,  At surgeon's request and post-op pain management  Laterality: Right  Prep: chloraprep       Needles:  Injection technique: Single-shot  Needle Type: Echogenic Needle     Needle Length: 9cm  Needle Gauge: 21     Additional Needles:   Procedures:,,,, ultrasound used (permanent image in chart),,    Narrative:  Start time: 09/02/2023 9:39 AM End time: 09/02/2023 9:44 AM Injection made incrementally with aspirations every 5 mL.  Performed by: Personally  Anesthesiologist: Collene Schlichter, MD  Additional Notes: No pain on injection. No increased resistance to injection. Injection made in 5cc increments.  Good needle visualization.  Patient tolerated procedure well.

## 2023-09-02 NOTE — Plan of Care (Signed)

## 2023-09-02 NOTE — Assessment & Plan Note (Addendum)
Suspect this is transient hepatitis due to sepsis. Pending repeat CMP. -f/u AM CMP -If not improving can consider hepatitis panel and right upper quadrant ultrasound

## 2023-09-02 NOTE — Progress Notes (Signed)
    I have spoken with and evaluated the patient this morning. He was admitted yesterday with sepsis due to a known, worsening right foot diabetic ulcer. He was scheduled for a right below knee amputation today.   He has been NPO since midnight. He is agreeable to right below knee amputation today. Will proceed with surgery.  Loel Dubonnet, PA-C [facility] 09/02/2023, 8:21AM

## 2023-09-02 NOTE — Assessment & Plan Note (Addendum)
Mildly increased creatinine to 1.37 from a baseline of 0.9-1.0.  Suspect this is prerenal in the setting of sepsis.  Likely also component of dehydration, no indications of ATN contributing at this time.  -Follow-up UA

## 2023-09-02 NOTE — Progress Notes (Signed)
OT Cancellation Note  Patient Details Name: Sean Carter MRN: [id number] DOB: 21-May-1971   Cancelled Treatment:    Reason Eval/Treat Not Completed: Patient at procedure or test/ unavailable (Pt in surgery for R BKA.)  Evern Bio 09/02/2023, 8:39 AM Berna Spare, OTR/L Acute Rehabilitation Services Office: [phone]

## 2023-09-02 NOTE — Progress Notes (Signed)
Daily Progress Note Intern Pager: [phone]  Patient name: Sean Carter Medical record number: [id number] Date of birth: 03/15/1971 Age: 52 y.o. Gender: male  Primary Care Provider: Bess Kinds, MD Consultants: vascular surgery Code Status: full code  Pt Overview and Major Events to Date:  11/13: admitted to FMTS  Assessment and Plan: Sean Carter is a 52 year old male presenting with sepsis due to a known diabetic right plantar foot ulcer. Going to the OR for BKA with vascular surgery today.   Pertinent PMH/PSH includes restrictive lung disease, restless leg.  Assessment & Plan Sepsis ([name]) Febrile with leukocytosis to 25.9 and elevated lactic acid (quickly normalized) on admission with R foot ulcer as most likely source. S/p 3L LR bolus and started empiric ABX treatment with Zosyn, cefuroxime, and vanc. Vascular surgery planning for BKA. Fever overnight to 101.9 that improved with tylenol. -f/u vascular surgery recommendations post-op -f/u blood cultures - no growth <24 hours -Adjust pain regimen as needed post-op. Currently:  -tylenol 650mg  q6h PRN for mild pain -tramadol 50mg  q12 PRN for severe pain  -f/u AM CMP, CBC -Vitals per floor -Start DVT prophylaxis after surgery -Follow-up chest x-ray, UA  -PT/OT eval and treat AKI (acute kidney injury) ([name]) Mildly increased creatinine to 1.37 from a baseline of 0.9-1.0.  Suspect this is prerenal in the setting of sepsis.  Likely also component of dehydration, no indications of ATN contributing at this time.  -Follow-up UA Type 2 diabetes mellitus ([name]) Last A1c 8.4.  Home regimen 35 units Basaglar daily, 12 to 14 units Humalog with meals. Got 25 units long acting yesterday. -redose long acting insulin post op -CBG checks with meals and at night -Moderate sliding scale -May need to add back meal coverage Elevated LFTs Suspect this is transient hepatitis due to sepsis. Pending repeat CMP. -f/u AM CMP -If not improving  can consider hepatitis panel and right upper quadrant ultrasound Normocytic anemia Hgb 9.1, MCV 80.2. Per my chart has not had outpatient work-up since 2021. Will repeat iron studies. Has not had colonoscopy. Suspect ACD due to chronic diabetic skin infections.  -f/u Ferritin, TIBC, Iron -Outpatient colonoscopy Subacute cough Per visit with Dr. Claudean Carter on 08/26/2023, pt has intermittent chronic cough with increase over the past month. Unlikely to be community-acquired pneumonia but was treated with Omnicef and doxycycline. PFTs in 11/23 showed mild restrictive lung disease, though this should not cause cough. -Follow-up chest x-ray -Robitussin as needed -Antibiotic regimen as above    Chronic and Stable Issues: HTN; home amlodipine, hydrochlorothiazide HLD: lipitor 40mg  GERD: pepcid 40mg  BID Restrictive lung disease: singulair 10mg  daily, claritin 10mg  daily  FEN/GI: NPO for OR PPx: held for OR Dispo: pending clinical improvement  Subjective:  Pt in OR for BKA.  Objective: Temp:  [98.3 F (36.8 C)-101.9 F (38.8 C)] 99 F (37.2 C) (11/14 0355) Pulse Rate:  [82-102] 89 (11/14 0355) Resp:  [18-25] 18 (11/13 1727) BP: (104-139)/(54-77) 135/77 (11/14 0355) SpO2:  [88 %-99 %] 92 % (11/14 0355) Weight:  [93 kg] 93 kg (11/13 1100) Physical Exam: Deferred as pt in OR  Laboratory: Most recent CBC Lab Results  Component Value Date   WBC 25.9 (H) 09/01/2023   HGB 9.1 (L) 09/01/2023   HCT 28.8 (L) 09/01/2023   MCV 80.2 09/01/2023   PLT 518 (H) 09/01/2023   Most recent BMP    Latest Ref Rng & Units 09/01/2023    7:38 AM  BMP  Glucose 70 - 99  mg/dL 409   BUN 6 - 20 mg/dL 16   Creatinine 8.11 - 1.24 mg/dL 9.14   Sodium 782 - 956 mmol/L 129   Potassium 3.5 - 5.1 mmol/L 3.8   Chloride 98 - 111 mmol/L 88   CO2 22 - 32 mmol/L 28   Calcium 8.9 - 10.3 mg/dL 8.6     Sean Carter, Tacey Ruiz, MD 09/02/2023, 7:01 AM  PGY-1, [facility] FPTS Intern pager: [phone], text  pages welcome Secure chat group [facility] Teaching Service

## 2023-09-02 NOTE — Hospital Course (Addendum)
Sean Carter is a 52 y.o.male with a history of HTN, DM2, diabetic foot wounds, restless leg, and HLD, who was admitted to the [facility] Teaching Service at [facility] for a R diabetic foot wound. His hospital course is detailed below:  Sepsis - R diabetic foot wound Presented with fever, leukocytosis, and elevated lactic acid most likely due to R foot ulcer. Started on broad spectrum ABX and got 3L fluid boluses. Vascular surgery consulted, recommended BKA which was done on 11/14. Blood cultures were negative for 2 days and ABX were discontinued.   AKI Cr increased to 1.37 from baseline around 1.0. Improved initially with fluids but increased back to 1.39 following BKA surgery.***  Elevated LFTs Transient increase on arrival, thought to be in setting of sepsis.  Repeat CMP***  Normocytic anemia Iron panel showed low iron, saturation ratio, TIBC, elevated ferritin.  Elevated ferritin thought to be acute phase reactant secondary to sepsis.  Postop hemoglobin 7.9.  Oral iron supplementation 325 mg was initiated.  Other chronic conditions were medically managed with home medications and formulary alternatives as necessary (hypertension, hyperlipidemia, GERD, RLS, restrictive lung disease)  PCP Follow-up Recommendations: Colonoscopy Follow-up Hepatic Steatosis seen on RUQ [place].

## 2023-09-02 NOTE — Interval H&P Note (Signed)
History and Physical Interval Note:  09/02/2023 9:47 AM  Sean Carter  has presented today for surgery, with the diagnosis of wound infection , foot pain.  The various methods of treatment have been discussed with the patient and family. After consideration of risks, benefits and other options for treatment, the patient has consented to  Procedure(s): AMPUTATION BELOW KNEE (Right) as a surgical intervention.  The patient's history has been reviewed, patient examined, no change in status, stable for surgery.  I have reviewed the patient's chart and labs.  Questions were answered to the patient's satisfaction.     Durene Cal

## 2023-09-02 NOTE — Assessment & Plan Note (Signed)
Last A1c 8.4.  Home regimen 35 units Basaglar daily, 12 to 14 units Humalog with meals. -Restarted LAI at 25 units daily, adjust as indicated -CBG checks with meals and at night -Moderate sliding scale -May need to add back meal coverage

## 2023-09-02 NOTE — Assessment & Plan Note (Signed)
Suspect this is transient hepatitis due to sepsis.  Will trend, expect will improve. -AM CMP -If not improving can consider hepatitis panel and right upper quadrant ultrasound

## 2023-09-02 NOTE — Assessment & Plan Note (Signed)
Hgb 9.1, MCV 80.2. Per my chart has not had outpatient work-up since 2021. Will repeat iron studies. Has not had colonoscopy. Suspect ACD due to chronic diabetic skin infections.  -Ferritin, TIBC, Iron -Outpatient colonoscopy

## 2023-09-03 ENCOUNTER — Telehealth: Payer: Self-pay

## 2023-09-03 ENCOUNTER — Encounter (HOSPITAL_COMMUNITY): Payer: Self-pay | Admitting: Surgery

## 2023-09-03 DIAGNOSIS — N179 Acute kidney failure, unspecified: Secondary | ICD-10-CM

## 2023-09-03 DIAGNOSIS — R7989 Other specified abnormal findings of blood chemistry: Secondary | ICD-10-CM

## 2023-09-03 DIAGNOSIS — L089 Local infection of the skin and subcutaneous tissue, unspecified: Secondary | ICD-10-CM | POA: Insufficient documentation

## 2023-09-03 LAB — GLUCOSE, CAPILLARY
Glucose-Capillary: 318 mg/dL — ABNORMAL HIGH (ref 70–99)
Glucose-Capillary: 341 mg/dL — ABNORMAL HIGH (ref 70–99)
Glucose-Capillary: 347 mg/dL — ABNORMAL HIGH (ref 70–99)
Glucose-Capillary: 316 mg/dL — ABNORMAL HIGH (ref 70–99)
Glucose-Capillary: 225 mg/dL — ABNORMAL HIGH (ref 70–99)

## 2023-09-03 LAB — BASIC METABOLIC PANEL
Anion gap: 13 (ref 5–15)
BUN: 28 mg/dL — ABNORMAL HIGH (ref 6–20)
CO2: 24 mmol/L (ref 22–32)
Calcium: 7.7 mg/dL — ABNORMAL LOW (ref 8.9–10.3)
Chloride: 85 mmol/L — ABNORMAL LOW (ref 98–111)
Creatinine, Ser: 1.39 mg/dL — ABNORMAL HIGH (ref 0.61–1.24)
GFR, Estimated: >60 mL/min (ref >60)
Glucose, Bld: 317 mg/dL — ABNORMAL HIGH (ref 70–99)
Potassium: 4 mmol/L (ref 3.5–5.1)
Sodium: 126 mmol/L — ABNORMAL LOW (ref 135–145)

## 2023-09-03 LAB — CBC
HCT: 23.9 % — ABNORMAL LOW (ref 39.0–52.0)
Hemoglobin: 7.6 g/dL — ABNORMAL LOW (ref 13.0–17.0)
MCH: 24.7 pg — ABNORMAL LOW (ref 26.0–34.0)
MCHC: 31.8 g/dL (ref 30.0–36.0)
MCV: 77.6 fL — ABNORMAL LOW (ref 80.0–100.0)
Platelets: 535 10*3/uL — ABNORMAL HIGH (ref 150–400)
RBC: 3.08 MIL/uL — ABNORMAL LOW (ref 4.22–5.81)
RDW: 14.2 % (ref 11.5–15.5)
WBC: 15.4 10*3/uL — ABNORMAL HIGH (ref 4.0–10.5)
nRBC: 0 % (ref 0.0–0.2)

## 2023-09-03 LAB — SURGICAL PATHOLOGY

## 2023-09-03 MED ORDER — INSULIN ASPART 100 UNIT/ML IJ SOLN
14.0000 [IU] | Freq: Every day | INTRAMUSCULAR | Status: DC
  Administered 2023-09-03 – 2023-09-04 (×2): 14 [IU] via SUBCUTANEOUS

## 2023-09-03 MED ORDER — INSULIN GLARGINE-YFGN 100 UNIT/ML ~~LOC~~ SOLN
33.0000 [IU] | Freq: Every day | SUBCUTANEOUS | Status: DC
  Administered 2023-09-03 – 2023-09-04 (×2): 33 [IU] via SUBCUTANEOUS
  Filled 2023-09-03 (×3): qty 0.33

## 2023-09-03 MED ORDER — INSULIN ASPART 100 UNIT/ML IJ SOLN
12.0000 [IU] | Freq: Two times a day (BID) | INTRAMUSCULAR | Status: DC
  Administered 2023-09-03 – 2023-09-05 (×4): 12 [IU] via SUBCUTANEOUS

## 2023-09-03 MED ORDER — ACETAMINOPHEN 325 MG PO TABS
650.0000 mg | ORAL_TABLET | Freq: Four times a day (QID) | ORAL | Status: DC
  Administered 2023-09-03 – 2023-09-05 (×5): 650 mg via ORAL
  Filled 2023-09-03 (×5): qty 2

## 2023-09-03 MED ORDER — TRAMADOL HCL 50 MG PO TABS
50.0000 mg | ORAL_TABLET | Freq: Three times a day (TID) | ORAL | Status: DC | PRN
  Administered 2023-09-03 – 2023-09-04 (×4): 50 mg via ORAL
  Filled 2023-09-03 (×4): qty 1

## 2023-09-03 MED ORDER — DIPHENHYDRAMINE HCL 25 MG PO CAPS
25.0000 mg | ORAL_CAPSULE | Freq: Once | ORAL | Status: AC
Start: 2023-09-03 — End: 2023-09-03
  Administered 2023-09-03: 25 mg via ORAL
  Filled 2023-09-03: qty 1

## 2023-09-03 MED ORDER — ENOXAPARIN SODIUM 40 MG/0.4ML IJ SOSY
40.0000 mg | PREFILLED_SYRINGE | Freq: Every day | INTRAMUSCULAR | Status: DC
  Administered 2023-09-03 – 2023-09-05 (×3): 40 mg via SUBCUTANEOUS
  Filled 2023-09-03 (×3): qty 0.4

## 2023-09-03 MED ORDER — FERROUS SULFATE 325 (65 FE) MG PO TABS
325.0000 mg | ORAL_TABLET | ORAL | Status: DC
  Administered 2023-09-03 – 2023-09-05 (×2): 325 mg via ORAL
  Filled 2023-09-03 (×2): qty 1

## 2023-09-03 MED ORDER — INSULIN ASPART 100 UNIT/ML IJ SOLN
4.0000 [IU] | Freq: Once | INTRAMUSCULAR | Status: AC
  Administered 2023-09-03: 4 [IU] via SUBCUTANEOUS

## 2023-09-03 NOTE — Telephone Encounter (Signed)
Patient LVM on nurse line requesting a call back to discuss paperwork.   He reports he is currently in the hospital due to amputation and will need FMLA paperwork for his employer.   I attempted to call him back, however no answer.   LVM asking to return my call to discuss.

## 2023-09-03 NOTE — Progress Notes (Signed)
Daily Progress Note Intern Pager: [phone]  Patient name: Sean Carter Medical record number: [id number] Date of birth: Jul 19, 1971 Age: 52 y.o. Gender: male  Primary Care Provider: Bess Kinds, MD Consultants: vascular surgery Code Status: full  Pt Overview and Major Events to Date:  11/13: admitted to FMTS 11/14: R BKA  Assessment and Plan: [name] is a 52 year old male presenting with sepsis due to a known diabetic right plantar foot ulcer. Doing well after R BKA with vascular surgery. Continuing to titrate insulin regimen.   Pertinent PMH/PSH includes restrictive lung disease, restless leg.  Assessment & Plan Wound infection Septic on admission with R diabetic foot wound as most likely source. S/p R BKA yesterday. Leukocytosis downtrending, now 15.4 from 25.9 on admission. Blood cultures with no growth in 48 hours. Afebrile overnight. Pain well controlled. --d/c ABX today --vascular surgery to change dressings tomorrow (11/16) --PT/OT eval and treat --scheduled tylenol 650mg  q6h --tramadol 50mg  q8h PRN AKI (acute kidney injury) ([name]) Mildly increased creatinine to 1.37 from a baseline of 0.9-1.0.  Suspect this is prerenal in the setting of sepsis.  Has not had transient drop in blood pressure to suggest ATN. Improved initially with fluids. Elevated again to 1.39 this morning, possibly due to minimal PO intake around surgery yesterday. --encourage PO intake --monitor Cr with AM CMP Type 2 diabetes mellitus ([name]) Last A1c 8.4.  Home regimen 35 units Basaglar daily, 12 to 14 units Humalog with meals. Got 25 units long acting and 22 units short acting yesterday. Hyperglycemic to the 300s following surgery. -increase long acting insulin to 33 units nightly -12 units short acting with breakfast and lunch, 14 units with dinner -moderate SSI -CBG checks TIDM Hyponatremia Na 129 on admission, stable at 131 (corrected for hyperglycemia) -continue to monitor with  CMP Elevated LFTs Likely transient elevation due to sepsis. Downtrended on repeat CMP. -AM CMP -consider hepatitis panel if remains elevated Normocytic anemia Iron studies with low iron, saturation ratio, TIBC, and elevated ferritin (likely due to acute phase reactant.2/2 sepsis).  Post-op Hgb 7.9, stable this AM at 7.6. -continue to monitor with CBC -oral Fe supplementation every other day -recommend outpatient colonoscopy as pt has not had one before Subacute cough Per visit with Dr. Claudean Severance on 08/26/2023, pt has intermittent chronic cough with increase over the past month. Unlikely to be community-acquired pneumonia but was treated with Omnicef and doxycycline. PFTs in 11/23 showed mild restrictive lung disease, though this should not cause cough. CXR without acute abnormallities -Robitussin as needed -Antibiotic regimen as above    Chronic and Stable Issues: HTN; hold home amlodipine, hydrochlorothiazide due to soft pressures HLD: lipitor 40mg  GERD: pepcid 40mg  BID Restrictive lung disease: singulair 10mg  daily, claritin 10mg  daily  FEN/GI: carb modified PPx: lovenox Dispo: pending clinical improvement  Subjective:  Pt states he is doing well this morning. Denies pain, states he has a high pain tolerance and does not like taking pain medication, especially oxycodone. Had a BM this morning. States he will work very hard to not have to use a wheelchair. States his cough was less frequent overnight.  Objective: Temp:  [97.8 F (36.6 C)-99 F (37.2 C)] 97.8 F (36.6 C) (11/15 0336) Pulse Rate:  [67-92] 69 (11/15 0336) Resp:  [15-26] 20 (11/15 0336) BP: (94-131)/(55-75) 99/72 (11/15 0336) SpO2:  [84 %-100 %] 100 % (11/15 0336) Physical Exam: General: sitting up in bed, awake and conversant, in NAD Cardiovascular: RRR, normal S1/S2 Respiratory: CTAB, normal  WOB on RA Abdomen: normoactive bowel sounds, soft, nontender, nondistended Extremities: R BKA with clean, dry, intact  dressing, LLE with SCD in place  Laboratory: Most recent CBC Lab Results  Component Value Date   WBC 15.4 (H) 09/03/2023   HGB 7.6 (L) 09/03/2023   HCT 23.9 (L) 09/03/2023   MCV 77.6 (L) 09/03/2023   PLT 535 (H) 09/03/2023   Most recent BMP    Latest Ref Rng & Units 09/03/2023    4:05 AM  BMP  Glucose 70 - 99 mg/dL 295   BUN 6 - 20 mg/dL 28   Creatinine 6.21 - 1.24 mg/dL 3.08   Sodium 657 - 846 mmol/L 126   Potassium 3.5 - 5.1 mmol/L 4.0   Chloride 98 - 111 mmol/L 85   CO2 22 - 32 mmol/L 24   Calcium 8.9 - 10.3 mg/dL 7.7     Imaging/Diagnostic Tests: CXR: no consolidation or increased pulmonary vascular markings  Lorayne Bender, MD 09/03/2023, 8:09 AM  PGY-1, [facility] FPTS Intern pager: [phone], text pages welcome Secure chat group [facility] Teaching Service

## 2023-09-03 NOTE — Assessment & Plan Note (Deleted)
 Febrile with leukocytosis to 25.9 and elevated lactic acid (quickly normalized) on admission with R foot ulcer as most likely source. S/p 3L LR bolus and started empiric ABX treatment with Zosyn, cefuroxime, and vanc. Vascular surgery planning for BKA. Fever overnight to 101.9 that improved with tylenol. -f/u vascular surgery recommendations post-op -f/u blood cultures - no growth <24 hours -Adjust pain regimen as needed post-op. Currently:  -tylenol 650mg  q6h PRN for mild pain -tramadol 50mg  q12 PRN for severe pain  -f/u AM CMP, CBC -Vitals per floor -Start DVT prophylaxis after surgery -Follow-up chest x-ray, UA  -PT/OT eval and treat

## 2023-09-03 NOTE — Assessment & Plan Note (Addendum)
Na 129 on admission, stable at 131 (corrected for hyperglycemia) -continue to monitor with CMP

## 2023-09-03 NOTE — Progress Notes (Signed)
Inpatient Rehab Admissions Coordinator Note:   Per therapy recommendations patient was screened for CIR candidacy by Stephania Fragmin, PT. At this time, pt appears to be a potential candidate for CIR. I will place an order for rehab consult for full assessment, per our protocol.  Please contact me any with questions.Estill Dooms, PT, DPT [phone] 09/03/23 12:13 PM

## 2023-09-03 NOTE — Plan of Care (Signed)
  Problem: Education: Goal: Ability to describe self-care measures that may prevent or decrease complications (Diabetes Survival Skills Education) will improve Outcome: Progressing Goal: Individualized Educational Video(s) Outcome: Progressing   Problem: Education: Goal: Knowledge of General Education information will improve Description: Including pain rating scale, medication(s)/side effects and non-pharmacologic comfort measures Outcome: Progressing   Problem: Health Behavior/Discharge Planning: Goal: Ability to manage health-related needs will improve Outcome: Progressing   Problem: Clinical Measurements: Goal: Ability to maintain clinical measurements within normal limits will improve Outcome: Progressing Goal: Will remain free from infection Outcome: Progressing Goal: Diagnostic test results will improve Outcome: Progressing Goal: Respiratory complications will improve Outcome: Progressing Goal: Cardiovascular complication will be avoided Outcome: Progressing   Problem: Activity: Goal: Risk for activity intolerance will decrease Outcome: Progressing   Problem: Nutrition: Goal: Adequate nutrition will be maintained Outcome: Progressing   Problem: Coping: Goal: Level of anxiety will decrease Outcome: Progressing   Problem: Elimination: Goal: Will not experience complications related to bowel motility Outcome: Progressing

## 2023-09-03 NOTE — Assessment & Plan Note (Addendum)
Last A1c 8.4.  Home regimen 35 units Basaglar daily, 12 to 14 units Humalog with meals. Got 25 units long acting and 22 units short acting yesterday. Hyperglycemic to the 300s following surgery. -increase long acting insulin to 33 units nightly -12 units short acting with breakfast and lunch, 14 units with dinner -moderate SSI -CBG checks TIDM

## 2023-09-03 NOTE — Assessment & Plan Note (Addendum)
Septic on admission with R diabetic foot wound as most likely source. S/p R BKA yesterday. Leukocytosis downtrending, now 15.4 from 25.9 on admission. Blood cultures with no growth in 48 hours. Afebrile overnight. Pain well controlled. --d/c ABX today --vascular surgery to change dressings tomorrow (11/16) --PT/OT eval and treat --scheduled tylenol 650mg  q6h --tramadol 50mg  q8h PRN

## 2023-09-03 NOTE — Progress Notes (Addendum)
  Progress Note    09/03/2023 7:39 AM 1 Day Post-Op  Subjective:  no major complaints   Vitals:   09/02/23 2319 09/03/23 0336  BP: 112/66 99/72  Pulse: 67 69  Resp: 15 20  Temp: 98.2 F (36.8 C) 97.8 F (36.6 C)  SpO2: 98% 100%   Physical Exam: Cardiac:  regular Lungs:  non labored Incisions:  Right BKA dressings are clean, dry and intact Neurologic: alert and oriented  CBC    Component Value Date/Time   WBC 15.4 (H) 09/03/2023 0405   RBC 3.08 (L) 09/03/2023 0405   HGB 7.6 (L) 09/03/2023 0405   HCT 23.9 (L) 09/03/2023 0405   PLT 535 (H) 09/03/2023 0405   MCV 77.6 (L) 09/03/2023 0405   MCH 24.7 (L) 09/03/2023 0405   MCHC 31.8 09/03/2023 0405   RDW 14.2 09/03/2023 0405   LYMPHSABS 1.6 09/01/2023 0738   MONOABS 0.8 09/01/2023 0738   EOSABS 0.3 09/01/2023 0738   BASOSABS 0.0 09/01/2023 0738    BMET    Component Value Date/Time   NA 126 (L) 09/03/2023 0405   NA 134 08/26/2023 1545   K 4.0 09/03/2023 0405   CL 85 (L) 09/03/2023 0405   CO2 24 09/03/2023 0405   GLUCOSE 317 (H) 09/03/2023 0405   BUN 28 (H) 09/03/2023 0405   BUN 30 (H) 08/26/2023 1545   CREATININE 1.39 (H) 09/03/2023 0405   CREATININE 0.78 07/10/2020 0910   CALCIUM 7.7 (L) 09/03/2023 0405   GFRNONAA >60 09/03/2023 0405   GFRNONAA 106 07/10/2020 0910   GFRAA 123 07/10/2020 0910    INR    Component Value Date/Time   INR 1.1 05/30/2020 1049     Intake/Output Summary (Last 24 hours) at 09/03/2023 0739 Last data filed at 09/03/2023 0336 Gross per 24 hour  Intake 1636.78 ml  Output 1375 ml  Net 261.78 ml     Assessment/Plan:  52 y.o. male is s/p right BKA 1 Day Post-Op   Pain well controlled Right BKA dressings c/d/I Plan for dressing take down tomorrow Hgb 7.6. Asymptomatic PT/OT to eval with recs  Graceann Congress, PA-C [facility] [phone] 09/03/2023 7:39 AM   I have interviewed and examined patient with PA and agree with assessment and plan above.    Legion Discher C. Randie Heinz, MD [facility] [place] Office: [phone] Pager: [phone]

## 2023-09-03 NOTE — Assessment & Plan Note (Addendum)
Likely transient elevation due to sepsis. Downtrended on repeat CMP. -AM CMP -consider hepatitis panel if remains elevated

## 2023-09-03 NOTE — Plan of Care (Signed)
FMTS Brief Progress Note  PCP Social Visit  Went bedside to see patient, for social visit.  Provider is PCP to patient.  Patient doing well this morning, in good spirits.  Patient appreciates provider's visit.  Discussed pain well-controlled, feels she is doing well.  Patient planning to be in rehab for 2 to 3 months/recovery.  Discussed that he would like to file for FMLA.  Discussed I thought this was a good idea, and would assist with paperwork.  Plan for patient to send paperwork directly to clinic via fax.  Asked day team to provide clinic fax number to patient.  Patient otherwise doing well, left patient in good spirits.  Bess Kinds, MD 09/03/2023, 10:33 AM PGY-3, [facility] Night Resident  Please page [phone] with questions.

## 2023-09-03 NOTE — Assessment & Plan Note (Addendum)
Iron studies with low iron, saturation ratio, TIBC, and elevated ferritin (likely due to acute phase reactant.2/2 sepsis).  Post-op Hgb 7.9, stable this AM at 7.6. -continue to monitor with CBC -oral Fe supplementation every other day -recommend outpatient colonoscopy as pt has not had one before

## 2023-09-03 NOTE — Assessment & Plan Note (Addendum)
Mildly increased creatinine to 1.37 from a baseline of 0.9-1.0.  Suspect this is prerenal in the setting of sepsis.  Has not had transient drop in blood pressure to suggest ATN. Improved initially with fluids. Elevated again to 1.39 this morning, possibly due to minimal PO intake around surgery yesterday. --encourage PO intake --monitor Cr with AM CMP

## 2023-09-03 NOTE — Assessment & Plan Note (Addendum)
Per visit with Dr. Claudean Severance on 08/26/2023, pt has intermittent chronic cough with increase over the past month. Unlikely to be community-acquired pneumonia but was treated with Omnicef and doxycycline. PFTs in 11/23 showed mild restrictive lung disease, though this should not cause cough. CXR without acute abnormallities -Robitussin as needed -Antibiotic regimen as above

## 2023-09-03 NOTE — Evaluation (Signed)
Physical Therapy Evaluation Patient Details Name: Sean Carter MRN: [id number] DOB: 1971/07/30 Today's Date: 09/03/2023  History of Present Illness  Pt is 52 yo presenting to [facility] ED on 09/01/23 for R foot ulcer that has been present for weeks. Pt was trying conservative care. Whole leg had become edematous and erythematous from shin down over the last 5 days with fevers, chills, nausea and vomiting. Pt currently s/p R BKA on 09/02/23. PMH: DM II, Grangrene of L foot, sepsis.  Clinical Impression  Pt is presenting below baseline level of functioning. Currently pt is Min-Mod A for sit to stand and transfers and unable to progress gait. Pt has good family support form spouse at home. Pt was working and highly independent prior to hospitalization. Due to pt current functional status, home set up and available assistance at home recommending skilled physical therapy services 5-6x weekly on discharge from acute care hospital setting > 3 hours/day in order to improve functional mobility, decrease risk for falls and improve pt independence. Pt tolerated treatment session well.       If plan is discharge home, recommend the following: Assistance with cooking/housework;A little help with walking and/or transfers     Equipment Recommendations Wheelchair cushion (measurements PT);Wheelchair (measurements PT);Rolling walker (2 wheels);BSC/3in1;Hospital bed  Recommendations for Other Services  Rehab consult    Functional Status Assessment Patient has had a recent decline in their functional status and demonstrates the ability to make significant improvements in function in a reasonable and predictable amount of time.     Precautions / Restrictions Precautions Precautions: Fall Restrictions Weight Bearing Restrictions: Yes RLE Weight Bearing: Non weight bearing      Mobility  Bed Mobility     General bed mobility comments: Pt up in recliner on arrival and departure    Transfers Overall  transfer level: Needs assistance Equipment used: Rolling walker (2 wheels) Transfers: Sit to/from Stand, Bed to chair/wheelchair/BSC Sit to Stand: Min assist   Step pivot transfers: Mod assist Squat pivot transfers: Contact guard assist     General transfer comment: very slow movements with very low foot clearance for step pivot transfer, LLE with 1x buckling. CGA for squat pivot transfer from EOB to recliner.    Ambulation/Gait     Pre-gait activities: Pt performed hop to steps with small turns and very low foot clearance for transfer, buckling at the L knee. Unable to progress further for gait.       Balance Overall balance assessment: Needs assistance Sitting-balance support: Feet supported, No upper extremity supported Sitting balance-Leahy Scale: Good     Standing balance support: Reliant on assistive device for balance Standing balance-Leahy Scale: Poor         Pertinent Vitals/Pain Pain Assessment Pain Assessment: No/denies pain    Home Living Family/patient expects to be discharged to:: Private residence Living Arrangements: Spouse/significant other Available Help at Discharge: Family;Available 24 hours/day Type of Home: House Home Access: Stairs to enter Entrance Stairs-Rails: Right;Left;Can reach both Entrance Stairs-Number of Steps: 2 Alternate Level Stairs-Number of Steps: 16 Home Layout: Two level Home Equipment: Rolling Walker (2 wheels) Additional Comments: No sure where it is, but spouse is checking    Prior Function Prior Level of Function : Independent/Modified Independent;Working/employed;Driving             Mobility Comments: Pt stats he has navigated stairs before per home set up with NWB on the RLE reports he went forward 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 Minimal improvement during session. Time spent during session education pt and spouse on possible discharge and equipment including OPPT v AIR v HHPT, and W/C, Walker knee scooter. Time was spent with pt problem solving how to navigate stairs at home per home set up.        Assessment/Plan    PT Assessment Patient needs continued PT services  PT Problem List Decreased strength;Decreased mobility;Decreased activity tolerance;Decreased balance       PT Treatment Interventions DME instruction;Therapeutic exercise;Gait training;Balance training;Stair training;Functional mobility training;Therapeutic activities;Patient/family education    PT Goals (Current goals can be found in the Care Plan section)  Acute Rehab PT Goals Patient Stated Goal: To go home and get to walking as soon as possible PT Goal Formulation: With patient Time For Goal Achievement: 09/17/23 Potential to Achieve Goals: Good    Frequency Min 1X/week        AM-PAC PT "6 Clicks" Mobility  Outcome Measure Help needed turning from your back to your side while in a flat bed without using bedrails?: A Little Help needed moving from lying on your back to sitting on the side of a flat bed without using bedrails?: A Little Help needed moving to and from a bed to a chair (including a wheelchair)?: A Little Help needed standing up from a chair using your arms (e.g., wheelchair or bedside chair)?: A Lot Help needed to walk in hospital room?: Total Help needed climbing 3-5 steps with a railing? : Total 6  Click Score: 13    End of Session Equipment Utilized During Treatment: Gait belt Activity Tolerance: Patient tolerated treatment well Patient left: in chair;with call bell/phone within reach;with family/visitor present Nurse Communication: Mobility status PT Visit Diagnosis: Other abnormalities of gait and mobility (R26.89)    Time: 1478-2956 PT Time Calculation (min) (ACUTE ONLY): 38 min   Charges:   PT Evaluation $PT Eval Low Complexity: 1 Low PT Treatments $Therapeutic Activity: 23-37 mins PT General Charges $$ ACUTE PT VISIT: 1 Visit         Harrel Carina, DPT, CLT  Acute Rehabilitation Services Office: [phone] (Secure chat preferred)   Sean Carter 09/03/2023, 11:50 AM

## 2023-09-03 NOTE — Evaluation (Signed)
Occupational Therapy Evaluation Patient Details Name: Sean Carter MRN: [id number] DOB: Apr 09, 1971 Today's Date: 09/03/2023   History of Present Illness 52 yo M s/p R BKA.  PMH includes:DM II, Grangrene of L foot, sepsis.   Clinical Impression   Patient admitted for the diagnosis above.  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  Currently needing up to Mod A for ADL completion and mobility at RW level, Patient will benefit from intensive inpatient follow up therapy, >3 hours/day.  OT will continue efforts in the acute setting to address deficits.          If plan is discharge home, recommend the following: A lot of help with bathing/dressing/bathroom;Assist for transportation;Assistance with cooking/housework;A lot of help with walking and/or transfers    Functional Status Assessment  Patient has had a recent decline in their functional status and demonstrates the ability to make significant improvements in function in a reasonable and predictable amount of time.  Equipment Recommendations  BSC/3in1;Wheelchair (measurements OT);Wheelchair cushion (measurements OT);Tub/shower bench    Recommendations for Other Services Rehab consult     Precautions / Restrictions Precautions Precautions: Fall Restrictions Weight Bearing Restrictions: Yes RLE Weight Bearing: Non weight bearing      Mobility Bed Mobility Overal bed mobility: Needs Assistance Bed Mobility: Supine to Sit     Supine to sit: Supervision, HOB elevated          Transfers Overall transfer level: Needs assistance Equipment used: Rolling walker (2 wheels) Transfers: Sit to/from Stand, Bed to chair/wheelchair/BSC Sit to Stand: Mod assist     Step pivot transfers: Min assist, Mod assist            Balance Overall balance assessment: Needs assistance Sitting-balance support: Feet supported, No upper extremity  supported Sitting balance-Leahy Scale: Good     Standing balance support: Reliant on assistive device for balance Standing balance-Leahy Scale: Poor                             ADL either performed or assessed with clinical judgement   ADL       Grooming: Wash/dry hands;Wash/dry face;Set up;Sitting   Upper Body Bathing: Set up;Sitting   Lower Body Bathing: Moderate assistance;Sitting/lateral leans;Bed level   Upper Body Dressing : Set up;Sitting   Lower Body Dressing: Moderate assistance;Bed level;Sitting/lateral leans   Toilet Transfer: Moderate assistance;Stand-pivot;Rolling walker (2 wheels);BSC/3in1;Minimal assistance                   Vision Baseline Vision/History: 1 Wears glasses Patient Visual Report: No change from baseline       Perception Perception: Within Functional Limits       Praxis Praxis: WFL       Pertinent Vitals/Pain Pain Assessment Pain Assessment: No/denies pain     Extremity/Trunk Assessment Upper Extremity Assessment Upper Extremity Assessment: Overall WFL for tasks assessed   Lower Extremity Assessment Lower Extremity Assessment: Defer to PT evaluation   Cervical / Trunk Assessment Cervical / Trunk Assessment: Normal   Communication     Cognition Arousal: Alert Behavior During Therapy: WFL for tasks assessed/performed Overall Cognitive Status: Within Functional Limits for tasks assessed  General Comments   BP 112/60    Exercises     Shoulder Instructions      Home Living Family/patient expects to be discharged to:: Private residence Living Arrangements: Spouse/significant other Available Help at Discharge: Family;Available 24 hours/day Type of Home: House Home Access: Stairs to enter [employer] of Steps: 2 Entrance Stairs-Rails: Right;Left;Can reach both Home Layout: Two level Alternate Level Stairs-Number of Steps: 16 Alternate  Level Stairs-Rails: Right;Left Bathroom Shower/Tub: Chief Strategy Officer: Standard Bathroom Accessibility: Yes How Accessible: Accessible via walker Home Equipment: Rolling Walker (2 wheels)   Additional Comments: No sure where it is, but spouse is checking      Prior Functioning/Environment Prior Level of Function : Independent/Modified Independent;Working/employed;Driving                        OT Problem List: Decreased strength;Decreased activity tolerance;Impaired balance (sitting and/or standing)      OT Treatment/Interventions: Self-care/ADL training;Therapeutic activities;Balance training;DME and/or AE instruction;Patient/family education;Therapeutic exercise    OT Goals(Current goals can be found in the care plan section) Acute Rehab OT Goals Patient Stated Goal: Get stronger and go back to work OT Goal Formulation: With patient Time For Goal Achievement: 09/17/23 Potential to Achieve Goals: Good ADL Goals Pt Will Perform Grooming: with modified independence;standing Pt Will Perform Lower Body Dressing: with supervision;sit to/from stand Pt Will Transfer to Toilet: with supervision;ambulating;regular height toilet Pt/caregiver will Perform Home Exercise Program: Increased strength;Both right and left upper extremity;With theraband;With written HEP provided  OT Frequency: Min 1X/week    Co-evaluation              AM-PAC OT "6 Clicks" Daily Activity     Outcome Measure Help from another person eating meals?: None Help from another person taking care of personal grooming?: None Help from another person toileting, which includes using toliet, bedpan, or urinal?: A Lot Help from another person bathing (including washing, rinsing, drying)?: A Lot Help from another person to put on and taking off regular upper body clothing?: None Help from another person to put on and taking off regular lower body clothing?: A Lot 6 Click Score: 18   End of  Session Equipment Utilized During Treatment: Rolling walker (2 wheels);Gait belt Nurse Communication: Mobility status  Activity Tolerance: Patient tolerated treatment well Patient left: in chair;with call bell/phone within reach  OT Visit Diagnosis: Unsteadiness on feet (R26.81);Muscle weakness (generalized) (M62.81)                Time: 8469-6295 OT Time Calculation (min): 22 min Charges:  OT General Charges $OT Visit: 1 Visit OT Evaluation $OT Eval Moderate Complexity: 1 Mod  09/03/2023  RP, OTR/L  Acute Rehabilitation Services  Office:  [phone]   Suzanna Obey 09/03/2023, 10:16 AM

## 2023-09-03 NOTE — Progress Notes (Signed)
Inpatient Rehab Admissions:  Inpatient Rehab Consult received.  I met with pt at the bedside for rehabilitation assessment and to discuss goals and expectations of an inpatient rehab admission.  Discussed average length of stay, insurance authorization requirement and discharge home after completion of CIR. Pt acknowledged understanding. Pt would prefer to receive OP therapy. TOC made aware.  Signed: Wolfgang Phoenix, MS, CCC-SLP Admissions Coordinator [phone]

## 2023-09-04 ENCOUNTER — Inpatient Hospital Stay (HOSPITAL_COMMUNITY): Payer: Self-pay

## 2023-09-04 LAB — COMPREHENSIVE METABOLIC PANEL
ALT: 119 U/L — ABNORMAL HIGH (ref 0–44)
AST: 181 U/L — ABNORMAL HIGH (ref 15–41)
Albumin: 1.7 g/dL — ABNORMAL LOW (ref 3.5–5.0)
Alkaline Phosphatase: 163 U/L — ABNORMAL HIGH (ref 38–126)
Anion gap: 9 (ref 5–15)
BUN: 29 mg/dL — ABNORMAL HIGH (ref 6–20)
CO2: 30 mmol/L (ref 22–32)
Calcium: 7.9 mg/dL — ABNORMAL LOW (ref 8.9–10.3)
Chloride: 93 mmol/L — ABNORMAL LOW (ref 98–111)
Creatinine, Ser: 1.54 mg/dL — ABNORMAL HIGH (ref 0.61–1.24)
GFR, Estimated: 54 mL/min — ABNORMAL LOW (ref >60)
Glucose, Bld: 181 mg/dL — ABNORMAL HIGH (ref 70–99)
Potassium: 4.5 mmol/L (ref 3.5–5.1)
Sodium: 132 mmol/L — ABNORMAL LOW (ref 135–145)
Total Bilirubin: 0.5 mg/dL (ref <1.2)
Total Protein: 5.4 g/dL — ABNORMAL LOW (ref 6.5–8.1)

## 2023-09-04 LAB — CBC
HCT: 22.2 % — ABNORMAL LOW (ref 39.0–52.0)
HCT: 22.7 % — ABNORMAL LOW (ref 39.0–52.0)
Hemoglobin: 7.1 g/dL — ABNORMAL LOW (ref 13.0–17.0)
Hemoglobin: 7.2 g/dL — ABNORMAL LOW (ref 13.0–17.0)
MCH: 25.4 pg — ABNORMAL LOW (ref 26.0–34.0)
MCH: 25.3 pg — ABNORMAL LOW (ref 26.0–34.0)
MCHC: 32 g/dL (ref 30.0–36.0)
MCHC: 31.7 g/dL (ref 30.0–36.0)
MCV: 79.3 fL — ABNORMAL LOW (ref 80.0–100.0)
MCV: 79.6 fL — ABNORMAL LOW (ref 80.0–100.0)
Platelets: 556 10*3/uL — ABNORMAL HIGH (ref 150–400)
Platelets: 557 10*3/uL — ABNORMAL HIGH (ref 150–400)
RBC: 2.8 MIL/uL — ABNORMAL LOW (ref 4.22–5.81)
RBC: 2.85 MIL/uL — ABNORMAL LOW (ref 4.22–5.81)
RDW: 14 % (ref 11.5–15.5)
RDW: 14.2 % (ref 11.5–15.5)
WBC: 17.1 10*3/uL — ABNORMAL HIGH (ref 4.0–10.5)
WBC: 14.4 10*3/uL — ABNORMAL HIGH (ref 4.0–10.5)
nRBC: 0 % (ref 0.0–0.2)
nRBC: 0 % (ref 0.0–0.2)

## 2023-09-04 LAB — HEPATITIS PANEL, ACUTE
HCV Ab: NONREACTIVE
Hep A IgM: NONREACTIVE
Hep B C IgM: NONREACTIVE
Hepatitis B Surface Ag: NONREACTIVE

## 2023-09-04 LAB — URINALYSIS, ROUTINE W REFLEX MICROSCOPIC
Bilirubin Urine: NEGATIVE
Glucose, UA: NEGATIVE mg/dL
Hgb urine dipstick: NEGATIVE
Ketones, ur: NEGATIVE mg/dL
Leukocytes,Ua: NEGATIVE
Nitrite: NEGATIVE
Protein, ur: NEGATIVE mg/dL
Specific Gravity, Urine: 1.012 (ref 1.005–1.030)
pH: 7 (ref 5.0–8.0)

## 2023-09-04 LAB — GLUCOSE, CAPILLARY
Glucose-Capillary: 171 mg/dL — ABNORMAL HIGH (ref 70–99)
Glucose-Capillary: 143 mg/dL — ABNORMAL HIGH (ref 70–99)
Glucose-Capillary: 164 mg/dL — ABNORMAL HIGH (ref 70–99)
Glucose-Capillary: 197 mg/dL — ABNORMAL HIGH (ref 70–99)

## 2023-09-04 NOTE — Progress Notes (Addendum)
  Progress Note    09/04/2023 7:50 AM 2 Days Post-Op  Subjective:  no major complaints. Asking about why he is getting ultrasound today   Vitals:   09/03/23 2342 09/04/23 0434  BP: 120/68 126/80  Pulse: 65 66  Resp: 20 20  Temp: 98.4 F (36.9 C) 98.3 F (36.8 C)  SpO2: 97% 95%   Physical Exam: Cardiac:  regular Lungs:  non labored Incisions:  right BKA incision with staples intact, well appearing, Kerlix and ACE reapplied   Neurologic: alert and oriented  CBC    Component Value Date/Time   WBC 17.1 (H) 09/04/2023 0326   RBC 2.80 (L) 09/04/2023 0326   HGB 7.1 (L) 09/04/2023 0326   HCT 22.2 (L) 09/04/2023 0326   PLT 556 (H) 09/04/2023 0326   MCV 79.3 (L) 09/04/2023 0326   MCH 25.4 (L) 09/04/2023 0326   MCHC 32.0 09/04/2023 0326   RDW 14.0 09/04/2023 0326   LYMPHSABS 1.6 09/01/2023 0738   MONOABS 0.8 09/01/2023 0738   EOSABS 0.3 09/01/2023 0738   BASOSABS 0.0 09/01/2023 0738    BMET    Component Value Date/Time   NA 132 (L) 09/04/2023 0326   NA 134 08/26/2023 1545   K 4.5 09/04/2023 0326   CL 93 (L) 09/04/2023 0326   CO2 30 09/04/2023 0326   GLUCOSE 181 (H) 09/04/2023 0326   BUN 29 (H) 09/04/2023 0326   BUN 30 (H) 08/26/2023 1545   CREATININE 1.54 (H) 09/04/2023 0326   CREATININE 0.78 07/10/2020 0910   CALCIUM 7.9 (L) 09/04/2023 0326   GFRNONAA 54 (L) 09/04/2023 0326   GFRNONAA 106 07/10/2020 0910   GFRAA 123 07/10/2020 0910    INR    Component Value Date/Time   INR 1.1 05/30/2020 1049     Intake/Output Summary (Last 24 hours) at 09/04/2023 0751 Last data filed at 09/04/2023 0439 Gross per 24 hour  Intake --  Output 1350 ml  Net -1350 ml     Assessment/Plan:  52 y.o. male is s/p Right BKA 2 Days Post-Op   Pain overall well controlled Right BKA dressings removed. Stump looks great Hgb 7.1. Asymptomatic. No signs of bleeding Will order stump stocking Continue PT/OT PT/OT recommending CIR however with insurance coverage pt likely home  with outpatient PT/OT   Dory Horn [facility] [phone] 09/04/2023 7:51 AM  I have independently interviewed and examined patient and agree with PA assessment and plan above.   Aaliyah Cancro C. Randie Heinz, MD [facility] [place] Office: [phone] Pager: [phone]

## 2023-09-04 NOTE — Assessment & Plan Note (Addendum)
Cr 1.38 on admission, baseline around 0.9, thought to be prerenal initially secondary to sepsis. Improved with fluids, then became elevated again after surgery likely due to decreased PO intake. This morning creatinine is 1.54.  -Renal ultrasound today -U/A today --encourage PO intake --monitor Cr with AM BMP

## 2023-09-04 NOTE — Assessment & Plan Note (Addendum)
Per visit with Dr. Claudean Severance on 08/26/2023, pt has intermittent chronic cough with increase over the past month. Unlikely to be community-acquired pneumonia but was treated with Omnicef and doxycycline. PFTs in 11/23 showed mild restrictive lung disease, though this should not cause cough. CXR without acute abnormallities -Robitussin as needed

## 2023-09-04 NOTE — Progress Notes (Signed)
Daily Progress Note Intern Pager: [phone]  Patient name: Sean Carter Medical record number: [id number] Date of birth: 11/06/1970 Age: 52 y.o. Gender: male  Primary Care Provider: Bess Kinds, MD Consultants: vascular surgery Code Status: Full  Pt Overview and Major Events to Date:  11/13 - admitted 11/14 - R BKA  Assessment and Plan:  52yo male with PMHx HTN, HLD, GERD, restrictive lung disease, restless leg syndrome who presented with sepsis in setting of diabetic ulcer to right foot. Pt had R BKA 09/02/23 and has been doing well since.  Vascular to change dressings today Working up liver enzyme elevation and elevated creatinine today. Assessment & Plan Wound infection Septic on admission with R diabetic foot wound as most likely source, s/p R BKA. Leukocytosis downtrending, now 17.1 from 25.9 on admission. Blood cultures with no growth x2d.  Afebrile overnight. Pain well controlled. --vascular surgery to change dressings today --PT/OT eval and treat --scheduled tylenol 650mg  q6h --tramadol 50mg  q8h PRN AKI (acute kidney injury) ([name]) Cr 1.38 on admission, baseline around 0.9, thought to be prerenal initially secondary to sepsis. Improved with fluids, then became elevated again after surgery likely due to decreased PO intake. This morning creatinine is 1.54.  -Renal ultrasound today -U/A today --encourage PO intake --monitor Cr with AM BMP Elevated LFTs AST, ALT elevated initially at 73, 57, thought to be transient in the setting of sepsis. Increased this morning to 181, 119 respectively.  -hepatitis panel today -RUQ ultrasound today -AM CMP Normocytic anemia Iron studies with low iron, saturation ratio, TIBC, and elevated ferritin (likely due to acute phase reactant 2/2 sepsis).  Post-op Hgb 7.9, now 7.1 this morning. -continue to monitor with CBC -pm Hgb today -oral Fe supplementation every other day 325mg  -recommend outpatient colonoscopy as pt has not had  one before Type 2 diabetes mellitus ([name]) Last A1c 8.4.  Home regimen 35 units Basaglar daily, 12 to 14 units Humalog with meals. Became hyperglycemic after surgery. -33U Semglee at night -12 units short acting with breakfast and lunch, 14 units with dinner -moderate SSI -CBG checks TIDM Hyponatremia Na 129 on admission, stable at 134 this morning (corrected for hyperglycemia) -continue to monitor with BMP Subacute cough Per visit with Dr. Claudean Severance on 08/26/2023, pt has intermittent chronic cough with increase over the past month. Unlikely to be community-acquired pneumonia but was treated with Omnicef and doxycycline. PFTs in 11/23 showed mild restrictive lung disease, though this should not cause cough. CXR without acute abnormallities -Robitussin as needed   Chronic and Stable Problems: HTN; hold home amlodipine, hydrochlorothiazide due to soft pressures HLD: continue home atorvastatin 40mg  GERD: continue home pepcid 40mg  BID Restrictive lung disease: continue home singulair 10mg  daily, claritin 10mg  daily, albuterol q6h PRN RLS: continue home requip 1mg  at bedtime   FEN/GI: regular PPx: lovenox Dispo: pending clinical improvement   Subjective:  No acute events overnight, feeling well today.  Objective: Temp:  [98 F (36.7 C)-98.4 F (36.9 C)] 98.3 F (36.8 C) (11/16 0434) Pulse Rate:  [65-80] 66 (11/16 0434) Resp:  [18-20] 20 (11/16 0434) BP: (109-126)/(62-80) 126/80 (11/16 0434) SpO2:  [95 %-100 %] 95 % (11/16 0434) Physical Exam: General: No acute distress, resting comfortably Cardiovascular: Regular rate and rhythm Respiratory: No increased work of breathing on room air Extremities: Right stump wrapped which appears clean dry and intact  Laboratory: Most recent CBC Lab Results  Component Value Date   WBC 17.1 (H) 09/04/2023   HGB 7.1 (L) 09/04/2023  HCT 22.2 (L) 09/04/2023   MCV 79.3 (L) 09/04/2023   PLT 556 (H) 09/04/2023   Most recent BMP    Latest Ref  Rng & Units 09/04/2023    3:26 AM  BMP  Glucose 70 - 99 mg/dL 621   BUN 6 - 20 mg/dL 29   Creatinine 3.08 - 1.24 mg/dL 6.57   Sodium 846 - 962 mmol/L 132   Potassium 3.5 - 5.1 mmol/L 4.5   Chloride 98 - 111 mmol/L 93   CO2 22 - 32 mmol/L 30   Calcium 8.9 - 10.3 mg/dL 7.9     Kasidee Voisin, DO 09/04/2023, 7:43 AM  PGY-1, [facility] FPTS Intern pager: [phone], text pages welcome Secure chat group [facility] Teaching Service

## 2023-09-04 NOTE — Assessment & Plan Note (Addendum)
Septic on admission with R diabetic foot wound as most likely source, s/p R BKA. Leukocytosis downtrending, now 17.1 from 25.9 on admission. Blood cultures with no growth x2d.  Afebrile overnight. Pain well controlled. --vascular surgery to change dressings today --PT/OT eval and treat --scheduled tylenol 650mg  q6h --tramadol 50mg  q8h PRN

## 2023-09-04 NOTE — Assessment & Plan Note (Addendum)
Iron studies with low iron, saturation ratio, TIBC, and elevated ferritin (likely due to acute phase reactant 2/2 sepsis).  Post-op Hgb 7.9, now 7.1 this morning. -continue to monitor with CBC -pm Hgb today -oral Fe supplementation every other day 325mg  -recommend outpatient colonoscopy as pt has not had one before

## 2023-09-04 NOTE — Assessment & Plan Note (Addendum)
Last A1c 8.4.  Home regimen 35 units Basaglar daily, 12 to 14 units Humalog with meals. Became hyperglycemic after surgery. -33U Semglee at night -12 units short acting with breakfast and lunch, 14 units with dinner -moderate SSI -CBG checks TIDM

## 2023-09-04 NOTE — Progress Notes (Signed)
Orthopedic Tech Progress Note Patient Details:  Sean Carter 09/11/1971 [id number] Right BKA Retention Sock has been ordered from [facility].  [name], male   DOB: 12-19-1970, 52 y.o.   MRN: [id number]  Sean Carter 09/04/2023, 8:09 AM

## 2023-09-04 NOTE — Progress Notes (Signed)
Physical Therapy Treatment Patient Details Name: Sean Carter MRN: [id number] DOB: 06/04/1971 Today's Date: 09/04/2023   History of Present Illness Pt is 52 yo presenting to [facility] ED on 09/01/23 for R foot ulcer that has been present for weeks. Pt was trying conservative care. Whole leg had become edematous and erythematous from shin down over the last 5 days with fevers, chills, nausea and vomiting. Pt currently s/p R BKA on 09/02/23. PMH: DM II, Grangrene of L foot, sepsis.    PT Comments  Pt received in supine and agreeable to session. Pt demonstrates good progress towards functional mobility goals this session, however continues to be limited by impaired activity tolerance and balance. Pt able to progress to CGA for standing and demonstrate improved balance with being able to stand with single UE support during blood sugar check. Pt able to tolerate 2 short gait trials using hop-to gait pattern, however pt demonstrates difficulty offloading LLE with BUE for swing through. Pt demonstrates increased instability and x2 posterior LOB due to hopping on LLE with low foot clearance. Distance limited by BUE fatigue. Pt encouraged to perform chair push ups when sitting in the recliner for BUE strengthening. Education provided on OPPT, use of equipment, and preparation for prosthesis. Pt continues to benefit from PT services to progress toward functional mobility goals.    If plan is discharge home, recommend the following: Assistance with cooking/housework;A little help with walking and/or transfers   Can travel by private Theme park manager cushion (measurements PT);Wheelchair (measurements PT);Rolling walker (2 wheels);BSC/3in1;Hospital bed    Recommendations for Other Services       Precautions / Restrictions Precautions Precautions: Fall Restrictions Weight Bearing Restrictions: Yes RLE Weight Bearing: Non weight bearing     Mobility  Bed  Mobility Overal bed mobility: Needs Assistance Bed Mobility: Supine to Sit, Sit to Supine     Supine to sit: Supervision, HOB elevated Sit to supine: Supervision, HOB elevated   General bed mobility comments: increased time    Transfers Overall transfer level: Needs assistance Equipment used: Rolling walker (2 wheels) Transfers: Sit to/from Stand Sit to Stand: Min assist, Contact guard assist, From elevated surface           General transfer comment: from elevated EOB with initial min A progressing to CGA. Cues for hand placement    Ambulation/Gait Ambulation/Gait assistance: Min assist Gait Distance (Feet): 6 Feet (x2) Assistive device: Rolling walker (2 wheels) Gait Pattern/deviations: Trunk flexed (hop-to)       General Gait Details: Hop-to pattern with low LLE foot clearance and pt hopping with LLE instead of supporting with BUE and swinging LLE through despite cues and demonstration. pt demonstrating quick BUE fatigue requiring seated rest break. 1 initial posterior LOB back to sitting EOB and one requiring assist to correct. no L knee bucking noted       Balance Overall balance assessment: Needs assistance Sitting-balance support: Feet supported, No upper extremity supported Sitting balance-Leahy Scale: Good Sitting balance - Comments: EOB   Standing balance support: Reliant on assistive device for balance, Bilateral upper extremity supported, During functional activity, Single extremity supported Standing balance-Leahy Scale: Poor Standing balance comment: with RW support. Pt able to static stand with single UE support and CGA without LOB                            Cognition Arousal: Alert Behavior During Therapy:  WFL for tasks assessed/performed Overall Cognitive Status: Within Functional Limits for tasks assessed                                          Exercises      General Comments        Pertinent Vitals/Pain Pain  Assessment Pain Assessment: Faces Faces Pain Scale: Hurts little more Pain Location: R residual limb Pain Descriptors / Indicators: Throbbing, Aching Pain Intervention(s): Limited activity within patient's tolerance, Monitored during session, Repositioned     PT Goals (current goals can now be found in the care plan section) Acute Rehab PT Goals Patient Stated Goal: To go home and get to walking as soon as possible PT Goal Formulation: With patient Time For Goal Achievement: 09/17/23 Progress towards PT goals: Progressing toward goals    Frequency    Min 1X/week       AM-PAC PT "6 Clicks" Mobility   Outcome Measure  Help needed turning from your back to your side while in a flat bed without using bedrails?: None Help needed moving from lying on your back to sitting on the side of a flat bed without using bedrails?: A Little Help needed moving to and from a bed to a chair (including a wheelchair)?: A Little Help needed standing up from a chair using your arms (e.g., wheelchair or bedside chair)?: A Little Help needed to walk in hospital room?: Total Help needed climbing 3-5 steps with a railing? : Total 6 Click Score: 15    End of Session Equipment Utilized During Treatment: Gait belt Activity Tolerance: Patient tolerated treatment well;Patient limited by fatigue Patient left: with call bell/phone within reach;in bed Nurse Communication: Mobility status PT Visit Diagnosis: Other abnormalities of gait and mobility (R26.89)     Time: 1610-9604 PT Time Calculation (min) (ACUTE ONLY): 48 min  Charges:    $Gait Training: 23-37 mins $Therapeutic Activity: 8-22 mins PT General Charges $$ ACUTE PT VISIT: 1 Visit                     Johny Shock, PTA Acute Rehabilitation Services Secure Chat Preferred  [phone]    Johny Shock 09/04/2023, 1:05 PM

## 2023-09-04 NOTE — Plan of Care (Signed)
  Problem: Education: Goal: Ability to describe self-care measures that may prevent or decrease complications (Diabetes Survival Skills Education) will improve Outcome: Progressing   Problem: Coping: Goal: Ability to adjust to condition or change in health will improve Outcome: Progressing   Problem: Fluid Volume: Goal: Ability to maintain a balanced intake and output will improve Outcome: Progressing   Problem: Health Behavior/Discharge Planning: Goal: Ability to identify and utilize available resources and services will improve Outcome: Progressing Goal: Ability to manage health-related needs will improve Outcome: Progressing   Problem: Metabolic: Goal: Ability to maintain appropriate glucose levels will improve Outcome: Progressing   Problem: Nutritional: Goal: Maintenance of adequate nutrition will improve Outcome: Progressing Goal: Progress toward achieving an optimal weight will improve Outcome: Progressing   Problem: Skin Integrity: Goal: Risk for impaired skin integrity will decrease Outcome: Progressing   Problem: Tissue Perfusion: Goal: Adequacy of tissue perfusion will improve Outcome: Progressing   Problem: Education: Goal: Knowledge of General Education information will improve Description: Including pain rating scale, medication(s)/side effects and non-pharmacologic comfort measures Outcome: Progressing   Problem: Health Behavior/Discharge Planning: Goal: Ability to manage health-related needs will improve Outcome: Progressing   Problem: Clinical Measurements: Goal: Will remain free from infection Outcome: Progressing   Problem: Activity: Goal: Risk for activity intolerance will decrease Outcome: Progressing   Problem: Nutrition: Goal: Adequate nutrition will be maintained Outcome: Progressing   Problem: Elimination: Goal: Will not experience complications related to bowel motility Outcome: Progressing   Problem: Pain Management: Goal:  General experience of comfort will improve Outcome: Progressing   Problem: Skin Integrity: Goal: Risk for impaired skin integrity will decrease Outcome: Progressing

## 2023-09-04 NOTE — Assessment & Plan Note (Addendum)
Na 129 on admission, stable at 134 this morning (corrected for hyperglycemia) -continue to monitor with BMP

## 2023-09-04 NOTE — Assessment & Plan Note (Addendum)
AST, ALT elevated initially at 73, 57, thought to be transient in the setting of sepsis. Increased this morning to 181, 119 respectively.  -hepatitis panel today -RUQ ultrasound today -AM CMP

## 2023-09-05 LAB — COMPREHENSIVE METABOLIC PANEL
ALT: 84 U/L — ABNORMAL HIGH (ref 0–44)
AST: 80 U/L — ABNORMAL HIGH (ref 15–41)
Albumin: 1.8 g/dL — ABNORMAL LOW (ref 3.5–5.0)
Alkaline Phosphatase: 149 U/L — ABNORMAL HIGH (ref 38–126)
Anion gap: 7 (ref 5–15)
BUN: 23 mg/dL — ABNORMAL HIGH (ref 6–20)
CO2: 29 mmol/L (ref 22–32)
Calcium: 7.8 mg/dL — ABNORMAL LOW (ref 8.9–10.3)
Chloride: 98 mmol/L (ref 98–111)
Creatinine, Ser: 1.14 mg/dL (ref 0.61–1.24)
GFR, Estimated: >60 mL/min (ref >60)
Glucose, Bld: 186 mg/dL — ABNORMAL HIGH (ref 70–99)
Potassium: 3.8 mmol/L (ref 3.5–5.1)
Sodium: 134 mmol/L — ABNORMAL LOW (ref 135–145)
Total Bilirubin: 0.3 mg/dL (ref <1.2)
Total Protein: 5.5 g/dL — ABNORMAL LOW (ref 6.5–8.1)

## 2023-09-05 LAB — CBC
HCT: 23.4 % — ABNORMAL LOW (ref 39.0–52.0)
Hemoglobin: 7.2 g/dL — ABNORMAL LOW (ref 13.0–17.0)
MCH: 24.9 pg — ABNORMAL LOW (ref 26.0–34.0)
MCHC: 30.8 g/dL (ref 30.0–36.0)
MCV: 81 fL (ref 80.0–100.0)
Platelets: 560 10*3/uL — ABNORMAL HIGH (ref 150–400)
RBC: 2.89 MIL/uL — ABNORMAL LOW (ref 4.22–5.81)
RDW: 14.4 % (ref 11.5–15.5)
WBC: 12.3 10*3/uL — ABNORMAL HIGH (ref 4.0–10.5)
nRBC: 0 % (ref 0.0–0.2)

## 2023-09-05 LAB — GLUCOSE, CAPILLARY
Glucose-Capillary: 171 mg/dL — ABNORMAL HIGH (ref 70–99)
Glucose-Capillary: 111 mg/dL — ABNORMAL HIGH (ref 70–99)

## 2023-09-05 MED ORDER — INSULIN ASPART 100 UNIT/ML IJ SOLN: 14.0000 [IU] | Freq: Two times a day (BID) | INTRAMUSCULAR | Status: DC

## 2023-09-05 MED ORDER — INSULIN GLARGINE-YFGN 100 UNIT/ML ~~LOC~~ SOLN
35.0000 [IU] | Freq: Every day | SUBCUTANEOUS | Status: DC
  Filled 2023-09-05: qty 0.35

## 2023-09-05 MED ORDER — FERROUS SULFATE 325 (65 FE) MG PO TABS
325.0000 mg | ORAL_TABLET | ORAL | 3 refills | Status: DC
  Filled 2023-09-05: qty 30, 60d supply, fill #0

## 2023-09-05 MED ORDER — INSULIN LISPRO (1 UNIT DIAL) 100 UNIT/ML (KWIKPEN): 14.0000 [IU] | PEN_INJECTOR | Freq: Three times a day (TID) | SUBCUTANEOUS | Status: DC

## 2023-09-05 NOTE — Discharge Instructions (Addendum)
Dear Sean Carter,  Thank you so much for allowing [place] to be part of your care!  You were admitted to [facility] for diabetic foot ulcer and underwent right leg below knee amputation.  DISCHARGE MEDICATION CHANGES See your attached documents for details Start oral iron pills. We are asking you to hold your hydrochlorothiazide and amlodipine until your follow up with PCP. We recommend that you stop taking Benadryl as it is not recommended for sleep due to negative side effects.  POST-HOSPITAL & CARE INSTRUCTIONS Please call your vascular surgeon's office at [phone] to schedule follow up for your wound care as soon as possible if you do not have an appointment already planned. For wound care: Please keep your wound well wrapped in ACE bandage or fitted stocking until your Vascular Surgery follow up appointment.  Please call Vascular for specific questions if needed, phone provided below. Our clinic fax number is [phone] for your FMLA paperwork. Please let PCP/Specialists know of any changes that were made. Please see medications section of this packet for any medication changes.  DOCTOR'S APPOINTMENT & FOLLOW UP CARE INSTRUCTIONS  Future Appointments  Date Time Provider Department Center  09/09/2023  1:30 PM Tiffany Kocher, DO [facility]   RETURN PRECAUTIONS: Please go to the ED if you have pus drainage, significant bleeding, or swelling of the surgical area.  Please also follow up if you are experiencing fevers. If you are having any lightheadedness, weakness, or dizziness, please go to the ED as well.  Take care and be well!  Family Medicine Teaching Service Inpatient Team [facility]  [address] [place], [place] [address] [phone]

## 2023-09-05 NOTE — Assessment & Plan Note (Deleted)
Brief creatinine bump from yesterday now downtrending to 1.14, baseline thought to be ~0.9.  Renal US and UA unremarkable yesterday. -AM BMP***

## 2023-09-05 NOTE — Plan of Care (Signed)
POC progressing.  

## 2023-09-05 NOTE — Assessment & Plan Note (Deleted)
Septic on admission with R diabetic foot wound as most likely source, s/p R BKA. Leukocytosis continues downtrending.  Pain well controlled.  Vascular Surgery evaluated BKA yesterday and stated stump appears well, ordered stump stocking, ready for discharge pending CIR placement***. -Pain regimen: Scheduled tylenol 650mg  q6h, tramadol 50mg  q8h PRN -Blood Cx NGTD@4d 

## 2023-09-05 NOTE — Assessment & Plan Note (Deleted)
Intermittent chronic cough, was treated with Omnicef and doxycycline. PFTs in 11/23 showed mild restrictive lung disease. -Robitussin as needed -Follow up with PCP

## 2023-09-05 NOTE — Assessment & Plan Note (Deleted)
Brief increase in AST/ALT from yesterday now downtrending, expect resolution.  Hepatitis panel unremarkable.  RUQ [place] with echogenic liver suggesting hepatic steatosis. -AM CMP

## 2023-09-05 NOTE — Discharge Summary (Cosign Needed Addendum)
Family Medicine Teaching [facility] Discharge Summary  Patient name: Sean Carter Medical record number: [id number] Date of birth: 13-Oct-1971 Age: 52 y.o. Gender: male Date of Admission: 09/01/2023  Date of Discharge: 09/05/2023 Admitting Physician: Celine Mans, MD  Primary Care Provider: Bess Kinds, MD Consultants: Vascular Surgery  Indication for Hospitalization: Sepsis 2/2 infected diabetic foot ulcer  Discharge Diagnoses/Problem List:  Principal Problem for Admission: Sepsis Other Problems addressed during stay:  Active Problems:   AKI (acute kidney injury) ([name])   Hyponatremia   Sepsis with acute organ dysfunction without septic shock ([name])   Type 2 diabetes mellitus ([name])   Elevated LFTs   Normocytic anemia   Subacute cough   Wound infection  Brief Hospital Course:  Sean Carter is a 52 y.o.male with a history of HTN, DM2, diabetic foot wounds, restless leg, and HLD, who was admitted to the [facility] Medicine Teaching Service at [facility] for a R diabetic foot wound. His hospital course is detailed below:  Sepsis  Right diabetic foot wound S/p Right BKA Presented with fever, leukocytosis, and elevated lactic acid most likely due to right foot ulcer. Started on broad spectrum Abx and got 3 L fluid boluses. Vascular surgery consulted, recommended BKA which was done on 11/14. Blood cultures were negative for 2 days and Abx were discontinued.  Hemoglobin decreased from 9.1 preop to 7.9 but remained stable and he was discharged with hemoglobin 7.2. Ordered outpatient PT and OT at discharge, unclear if order will go through, may need to reorder outpatient. On day of discharge, patient stated he has wheelchair at home, provided by relative recently. Care instructions per phone call to Dr. Randie Heinz of Vascular Surgery are to wear stump sleeve until follow up appointment.  AKI Cr increased to 1.37 from baseline around 1.0. Improved initially with fluids but briefly  increased back to 1.39 following BKA surgery.  Downtrending at discharge.  Renal ultrasound normal.  Elevated LFTs Transient increase on arrival, thought to be in setting of sepsis.  Downtrended with brief increase postop and then down trended at discharge.  RUQ [place] with evidence of hepatic steatosis  Normocytic anemia Iron panel showed low iron, saturation ratio, TIBC, elevated ferritin.  Elevated ferritin thought to be acute phase reactant secondary to sepsis.  Postop hemoglobin 7.9.  Likely multifactorial, anemia of chronic disease and iron deficiency.  Oral iron supplementation 325 mg was initiated.  Cough Was being treated for CAP prior to admission with omnicef and doxycycline.  CXR without acute abnormalities.  He was treated with Robitussin as needed.  HTN Home amlodipine and HCTZ held during hospitalization due to soft pressures.  Other chronic conditions were medically managed with home medications and formulary alternatives as necessary (hyperlipidemia, GERD, RLS, restrictive lung disease)  PCP Follow-up Recommendations: BMP and CBC, note hemoglobin 7.2 at discharge.  Follow up about outpatient PT/OT to ensure referral correctly done and patient connected. Held hydrochlorothiazide and amlodipine at discharge as normotensive, restart as appropriate.  Consider ARB for diabetes if not contraindicated. Colonoscopy per PCP outpatient given normocytic anemia (even prior to surgery). Follow-up Hepatic Steatosis seen on RUQ [place].   Disposition: Home with outpatient PT/OT, patient notes that he has wheelchair at home  Discharge Condition: Stable s/p BKA; discussed with patient recommendation to stay until tomorrow to adjust insulin, but patient states that he would like to leave today before 2 pm, as his wife is unable to pick him up the following day and has a bowling tournament after 2  pm today.  Discharge Exam:  Vitals:   09/05/23 0309 09/05/23 0800  BP: (!) 141/74 122/65  Pulse: 69  90  Resp: 18 18  Temp: 98.5 F (36.9 C) 98.3 F (36.8 C)  SpO2: 96% 96%   Physical Exam: General: Resting comfortably in bed, NAD, WNWD, alert and at baseline. Cardiovascular: Regular rate and rhythm. Normal S1/S2. No murmurs, rubs, or gallops appreciated. 2+ radial pulses. Pulmonary: Clear bilaterally to ascultation. No increased WOB, no accessory muscle usage on room air. No wheezes, rales, or crackles. Abdominal: No tenderness to deep or light palpation. No rebound or guarding. No HSM. Skin: Warm and dry.  No rashes grossly. Extremities: No L peripheral edema.  R BKA wrapped in ACE bandage tightly.  Under wrap, incision with multiple staples, well approximated.  No erythema, drainage, edema surrounding incision.  Significant Procedures: -R BKA 11/14  Significant Labs and Imaging:  Recent Labs  Lab 09/04/23 0326 09/04/23 1613 09/05/23 0349  WBC 17.1* 14.4* 12.3*  HGB 7.1* 7.2* 7.2*  HCT 22.2* 22.7* 23.4*  PLT 556* 557* 560*   Recent Labs  Lab 09/04/23 0326 09/05/23 0349  NA 132* 134*  K 4.5 3.8  CL 93* 98  CO2 30 29  GLUCOSE 181* 186*  BUN 29* 23*  CREATININE 1.54* 1.14  CALCIUM 7.9* 7.8*  ALKPHOS 163* 149*  AST 181* 80*  ALT 119* 84*  ALBUMIN 1.7* 1.8*   - Blood Cx x4: No growth @ 4 days  Results/Tests Pending at Time of Discharge: None  Discharge Medications:  Allergies as of 09/05/2023       Reactions   Scallops [shellfish Allergy] Anaphylaxis        Medication List     STOP taking these medications    amLODipine 10 MG tablet Commonly known as: NORVASC   cefdinir 300 MG capsule Commonly known as: OMNICEF   diphenhydrAMINE 25 MG tablet Commonly known as: BENADRYL   doxycycline 100 MG tablet Commonly known as: VIBRA-TABS   hydrochlorothiazide 25 MG tablet Commonly known as: HYDRODIURIL       TAKE these medications    acetaminophen 325 MG tablet Commonly known as: TYLENOL Take 2 tablets (650 mg total) by mouth every 6 (six) hours  as needed for headache or mild pain.   albuterol 108 (90 Base) MCG/ACT inhaler Commonly known as: VENTOLIN HFA Inhale 2 puffs into the lungs every 6 (six) hours as needed for wheezing or shortness of breath.   aspirin EC 81 MG tablet Take 1 tablet (81 mg total) by mouth daily. Swallow whole.   atorvastatin 40 MG tablet Commonly known as: LIPITOR Take 1 tablet by mouth once daily   Basaglar KwikPen 100 UNIT/ML Inject 35 Units into the skin daily.   blood glucose meter kit and supplies Kit Dispense based on patient and insurance preference. Use up to four times daily as directed.   Blood Glucose Monitoring Suppl Devi 1 each by Does not apply route in the morning, at noon, and at bedtime. May substitute to any manufacturer covered by patient's insurance.   cetirizine 10 MG tablet Commonly known as: ZYRTEC Take 1 tablet (10 mg total) by mouth daily.   Dexcom G7 Sensor Misc 3 Devices by Does not apply route as directed. Apply new sensor every 10 days   famotidine 40 MG tablet Commonly known as: PEPCID Take 1 tablet by mouth twice daily   ferrous sulfate 325 (65 FE) MG tablet Take 1 tablet (325 mg total) by mouth every  other day. Start taking on: September 07, 2023   insulin lispro 100 UNIT/ML KwikPen Commonly known as: HumaLOG KwikPen Inject 14 Units into the skin with breakfast, with lunch, and with evening meal. What changed:  how much to take when to take this   ipratropium 0.03 % nasal spray Commonly known as: ATROVENT Place 2 sprays into both nostrils every 12 (twelve) hours.   montelukast 10 MG tablet Commonly known as: SINGULAIR TAKE 1 TABLET BY MOUTH AT BEDTIME   ReliOn All-In-One Devi 1 each by Does not apply route 3 (three) times daily as needed.   ReliOn Lancets Thin 26G Misc 1 each by Does not apply route 3 (three) times daily as needed.   rOPINIRole 1 MG tablet Commonly known as: REQUIP Take 1 tablet (1 mg total) by mouth at bedtime.   UltiCare  Insulin Syringe 30G X 1/2" 0.3 ML Misc Generic drug: Insulin Syringe-Needle U-100 See admin instructions.        Discharge Instructions: Please refer to Patient Instructions section of EMR for full details.  Patient was counseled important signs and symptoms that should prompt return to medical care, changes in medications, dietary instructions, activity restrictions, and follow up appointments.   Follow-Up Appointments: Future Appointments  Date Time Provider Department Center  09/09/2023  1:30 PM Tiffany Kocher, DO [facility]    Sharion Dove, Jeson Camacho, MD 09/05/2023, 2:45 PM PGY-1, [facility]  I reviewed the medical decision making and verified the service and findings are accurately documented in the intern's note.  Erick Alley, DO 09/05/2023 7:47 PM

## 2023-09-05 NOTE — Assessment & Plan Note (Deleted)
A1c 7.9 this admission.  Home regimen 35 units Basaglar daily, 12 to 14 units Humalog with meals.  CBG goal <150 to promote wound healing, fasting 170 this AM.  Received 43 units SA yesterday.*** -33 units LA at night, 12 units SA breakfast/lunc, 14 units with dinner -Increase to 35 units LA and 14 units with meals -Moderate SSI -CBG checks ACHS

## 2023-09-05 NOTE — Assessment & Plan Note (Deleted)
Improved to 135 (corrected) from admission 129.  Resolved.

## 2023-09-05 NOTE — Assessment & Plan Note (Deleted)
Iron studies with low iron, saturation ratio, TIBC, and elevated ferritin (likely due to acute phase reactant 2/2 sepsis).  Post-op Hgb 7.9, stable at 7.2 for past 2 days. -Continue to monitor with CBC -PO Fe supplementation every other day 325 mg*** -Recommend outpatient colonoscopy as pt has not had one before

## 2023-09-06 ENCOUNTER — Telehealth: Payer: Self-pay | Admitting: Pharmacist

## 2023-09-06 ENCOUNTER — Other Ambulatory Visit: Payer: Self-pay | Admitting: Physician Assistant

## 2023-09-06 ENCOUNTER — Telehealth: Payer: Self-pay

## 2023-09-06 ENCOUNTER — Other Ambulatory Visit (HOSPITAL_COMMUNITY): Payer: Self-pay

## 2023-09-06 LAB — CULTURE, BLOOD (ROUTINE X 2)
Culture: NO GROWTH
Culture: NO GROWTH
Special Requests: ADEQUATE
Special Requests: ADEQUATE

## 2023-09-06 MED ORDER — TRAMADOL HCL 50 MG PO TABS: 50.0000 mg | ORAL_TABLET | Freq: Four times a day (QID) | ORAL | 0 refills | Status: DC | PRN

## 2023-09-06 MED ORDER — DEXCOM G7 SENSOR MISC: 3.0000 | 11 refills | Status: DC

## 2023-09-06 NOTE — Telephone Encounter (Signed)
Reviewed and agree with Dr Koval's plan.   

## 2023-09-06 NOTE — Telephone Encounter (Signed)
Pt called stating that he was d/c'd without paperwork or pain meds.  Reviewed pt's chart, returned call for clarification, two identifiers used. Pt reviewed several complaints during his d/c. Informed him that those would be passed onto the 4E unit director so he would be receiving a call. Spoke to [place], [place] who sent in the requested Tramadol to pt's pharmacy. Sent msg to schedulers to sch his 1 month post-op appt. Confirmed understanding.

## 2023-09-06 NOTE — Telephone Encounter (Signed)
Patient contacts the office in follow-up for need of clarification of medications following recent hospitalization for lower limb amputation. He had a request for CGM sensors, both samples and new prescription.  He states that his blood pressure medications were all stopped and his insulin dosing was unclear for his long-acting. Additionally, he was less clear on the plan for atorvastatin, famotidine and cetirizine.   Patient reports he is doing fairly well post-amputation.  He states that he checks blood pressures at home.  I asked him to check his blood pressures routinely over the next several days and asked him to contact us if blood pressure readings were concerning to him (suggested calling us if systolic readings are > systolic).  Did not recommendestarting BP medications at this time.   RE basal insulin, previously taking 35 units daily.  Prior to recent dose increased related to more aggressive management pre-surgery, patient reports taking 24-28 of his basal insulin chronically.  Suggested resumption of 24 units once daily at this time and to continue monitoring with CGM.   RE prandial insulin, previously (immediately pre-surgery) taking 09/29/13 units prior to his 3 meals daily.  We discussed and agreed to 8 units prior to meals at this time.   Advised continuation of atorvastatin, famotidine and cetirizine.   #2 Dexcom G7 sensors placed at front desk for pick-up.  New prescription for sensors sent to local pharmacy.    Total time with patient call and documentation of interaction: 22 minutes.  F/U Phone call planned: 7-10 days for CGM review.

## 2023-09-06 NOTE — Anesthesia Postprocedure Evaluation (Signed)
Anesthesia Post Note  Patient: Sean Carter  Procedure(s) Performed: AMPUTATION BELOW KNEE (Right: Knee)     Patient location during evaluation: PACU Anesthesia Type: Regional Level of consciousness: awake and alert Pain management: pain level controlled Vital Signs Assessment: post-procedure vital signs reviewed and stable Respiratory status: spontaneous breathing, nonlabored ventilation, respiratory function stable and patient connected to nasal cannula oxygen Cardiovascular status: stable and blood pressure returned to baseline Postop Assessment: no apparent nausea or vomiting Anesthetic complications: no   There were no known notable events for this encounter.  Last Vitals:    Last Pain:   Pain Goal:                  Collene Schlichter

## 2023-09-07 ENCOUNTER — Telehealth: Payer: Self-pay | Admitting: Family Medicine

## 2023-09-07 ENCOUNTER — Telehealth (INDEPENDENT_AMBULATORY_CARE_PROVIDER_SITE_OTHER): Payer: Self-pay | Admitting: Family Medicine

## 2023-09-07 DIAGNOSIS — Z89511 Acquired absence of right leg below knee: Secondary | ICD-10-CM | POA: Insufficient documentation

## 2023-09-07 NOTE — Telephone Encounter (Signed)
I called and discussed this situation with him. He will come to our office to pick up crutches with the nurse clinic tomorrow.  I apologize for the inconvenience and appreciate the feedback provided.  He was appreciative of the call.

## 2023-09-07 NOTE — Progress Notes (Signed)
Will no charge this visit as he comes in again in 2 days for HFU.

## 2023-09-07 NOTE — Assessment & Plan Note (Addendum)
Need mobility equipment He'll come in to pick up crutches from the office tomorrow which he needs for ambulation F/U with Vascular surgery as instructed Continue home health PT to help with mobility strength and training F/U HFU as scheduled this Thursday with Dr. Claudean Severance.  NB: I called him to inform him about his 8:30 am appointment for tomorrow - HIPAA compliant message left as I was unable to reach him this time.

## 2023-09-07 NOTE — Patient Instructions (Signed)
Leg Amputation, Care After The following information offers guidance on how to care for yourself after your procedure. Your health care provider may also give you more specific instructions. If you have problems or questions, contact your health care provider. What can I expect after the procedure? After the procedure, it is common to have: A little blood or fluid coming from your incision. Pain from your incision. Pain that feels like it is coming from the leg that has been removed (phantom pain). This can last for a year or longer. Skin breakdown on your stump (residual limb). Feelings of depression, anxiety, and fear. Follow these instructions at home: Medicines Take over-the-counter and prescription medicines only as told by your health care provider. If you were prescribed an antibiotic medicine, take it as told by your health care provider. Do not stop taking the antibiotic even if you start to feel better. Ask your health care provider if the medicine prescribed to you: Requires you to avoid driving or using machinery. Can cause constipation. You may need to take these actions to prevent or treat constipation: Take over-the-counter or prescription medicines. Eat foods that are high in fiber, such as beans, whole grains, and fresh fruits and vegetables. Limit foods that are high in fat and processed sugars, such as fried or sweet foods. Bathing Do not take baths, swim, use a hot tub, or get your residual limb wet until your health care provider approves. You may only be allowed to take sponge baths. Ask your health care provider when you may start taking showers. After taking a shower, make sure to rinse and dry your residual limb carefully. Incision care  Check your residual limb, especially your incision area, every day. Check for: Blisters. Scrapes. Signs of infection, such as: More redness, swelling, or pain. More fluid or blood. Warmth. Pus or a bad smell. Follow  instructions from your health care provider about how to take care of your incision. Make sure you: Wash your hands with soap and water for at least 20 seconds before and after you change your bandage (dressing). If soap and water are not available, use hand sanitizer. Change your dressing as told by your health care provider. Leave stitches (sutures), staples, skin glue, or adhesive strips in place. These skin closures may need to stay in place for 2 weeks or longer. If adhesive strip edges start to loosen and curl up, you may trim the loose edges. Do not remove adhesive strips completely unless your health care provider tells you to do that. Activity Return to your normal activities as told by your health care provider. Ask your health care provider what activities are safe for you. Do physical therapy exercises as told by your health care provider. If you have been fitted with an artificial leg (prosthesis) or have been given crutches or another assistive device, use them as told by your health care provider. Eating and drinking Eat a healthy diet that includes whole grains, fruits and vegetables, low-fat dairy products, and lean proteins. Drink enough fluid to keep your urine pale yellow. General instructions Do not use oils, lotion, cream, or rubbing alcohol on the remaining part of your leg. Wear compression stockings as told by your health care provider. These stockings help to prevent blood clots and reduce swelling in your legs. Work with an occupational therapist to learn new strategies for safe driving with an amputation. If you have trouble coping with your amputation, contact your health care provider. Some feelings of depression, anxiety,  or fear are normal after an amputation, but if you struggle with these feelings or if they get overwhelming, your provider may be able to recommend a therapist or support group to help you. Do not use any products that contain nicotine or tobacco.  These products include cigarettes, chewing tobacco, and vaping devices, such as e-cigarettes. These can delay bone and wound healing. If you need help quitting, ask your health care provider. Keep all follow-up visits. This is important. Contact a health care provider if: You have a fever. You have more tenderness in your residual limb. You have a rash or itchy skin. You have a cough or chills and you feel achy and weak. You have trouble coping with your amputation. You have blisters or scrapes on your residual limb. You have any of these signs of infection: More redness, swelling, or pain around your incision. More fluid or blood coming from your incision. The incision feels warm to the touch, tender, and painful. Pus or a bad smell coming from your incision. Get help right away if: You have severe pain in your residual limb. You feel light-headed and have shortness of breath. You have blood-soaked bandages. You have chest pain or pain when taking a deep breath or coughing. These symptoms may represent a serious problem that is an emergency. Do not wait to see if the symptoms will go away. Get medical help right away. Call your local emergency services (911 in the [place] Do not drive yourself to the hospital. If you ever feel like you may hurt yourself or others, or have thoughts about taking your own life, get help right away. Go to your nearest emergency department or: Call your local emergency services (911 in the [place] Call a suicide crisis helpline, such as the National Suicide Prevention Lifeline at (505)261-3165 or 988 in the [place] This is open 24 hours a day in the [place] Text the Crisis Text Line at 516-231-2570 (in the [place] Summary After a leg amputation, you may have pain that feels like it is coming from the leg that was removed (phantom pain). This can last for a year or longer. Follow instructions from your health care provider about how to take care of your incision. Check your  residual limb, especially your incision area, every day. More redness, swelling, or pain may be a sign of infection. Contact your health care provider if you have trouble coping with your amputation. This information is not intended to replace advice given to you by your health care provider. Make sure you discuss any questions you have with your health care provider. Document Revised: 04/30/2021 Document Reviewed: 02/06/2021 Elsevier Patient Education  2024 [employer].

## 2023-09-07 NOTE — Progress Notes (Signed)
[facility] Telemedicine Visit  Patient consented to have virtual visit and was identified by name and date of birth. Method of visit: Telephone  Encounter participants: Patient: Sean Carter - located at Home  Provider: Janit Pagan - located at [facility] office Others (if applicable): N/A  Chief Complaint: F/U BKA  HPI:  He was discharged from the hospital on Sunday but did not have a wheelchair or crutches at home. His brother was able to get him a wheelchair. However, he prefers crutches. He has an in-person f/u appointment with Dr. Claudean Severance on Thursday for a full HFU appointment. He has no additional questions.   ROS: per HPI  Pertinent PMHx: Reviewed  Exam:  There were no vitals taken for this visit.  Respiratory: He could complete a full sentence   Assessment/Plan:  S/P BKA (below knee amputation) unilateral, right ([name]) Need mobility equipment He'll come in to pick up crutches from the office tomorrow which he needs for ambulation F/U with Vascular surgery as instructed Continue home health PT to help with mobility strength and training F/U HFU as scheduled this Thursday with Dr. Claudean Severance.  NB: I called him to inform him about his 8:30 am appointment for tomorrow - HIPAA compliant message left as I was unable to reach him this time.     Time spent during visit with patient: 10 minutes

## 2023-09-07 NOTE — Telephone Encounter (Signed)
-----   Message from Nurse Dannette Barbara sent at 09/07/2023  2:57 PM EST ----- Regarding: Patient complaint Patient called into nurse line requesting to speak with someone regarding care he received at the hospital.   He voiced frustrations with his discharge. States that he did not receive instructions on how he was to care for himself, no crutches, felt like he was "thrown to the wolves."   He was unable to give me the name of the provider that he was referring to, only that provider was a male that came in early on Sunday morning.   Advised patient that I would forward this along to supervising provider.

## 2023-09-08 ENCOUNTER — Ambulatory Visit: Payer: Self-pay

## 2023-09-08 ENCOUNTER — Telehealth: Payer: Self-pay | Admitting: Family Medicine

## 2023-09-08 ENCOUNTER — Telehealth: Payer: Self-pay | Admitting: Student

## 2023-09-08 ENCOUNTER — Telehealth: Payer: Self-pay

## 2023-09-08 DIAGNOSIS — S88111D Complete traumatic amputation at level between knee and ankle, right lower leg, subsequent encounter: Secondary | ICD-10-CM

## 2023-09-08 NOTE — Telephone Encounter (Signed)
Sent on-call physician a page to obtain dressing change instructions. Dr. Lenell Antu replied.  Called Sean Carter, pt's significant other, as directed to give amputation site dressing instructions. She refused to get any advice from Dr. Lenell Antu. Reassured her and instructed her to wash with mild soap and water, pat dry, cover with clean gauze, Kerlix, ACE wrap, and use shrinker. She stated that she would have to get some soap and gauze at the store, but she felt confident in her abilities since she'd been helping him with other dressing changes in the past with other amputations. Instructed her to monitor and call back with any issues related to amp site care. Informed her that [facility] would be arranged and she should hear something from them tomorrow. Confirmed understanding.

## 2023-09-08 NOTE — Telephone Encounter (Signed)
-----   Message from Leonie Douglas sent at 09/08/2023  4:45 PM EST ----- Keep BKA stump covered with clean gauze and kerlix. Wrap with ACE wrap. Change daily and as needed for dirty dressing.

## 2023-09-08 NOTE — Telephone Encounter (Signed)
Dr. Lum Babe and I called the patient and his wife to discuss the grievances with his recent discharge after he was hospitalized for right BKA in setting of diabetic foot wound.  Patient and wife both expressed frustration regarding the following:  -Patient states he was told by a male doctor that he would be discharged on Sunday, 09/05/2023.  This is accurate, however, following this the inpatient team discussed that it would be better for patient to stay one more night to monitor his blood sugars as we were changing his insulin regimen for better control.  Dr. Lum Babe reminded patient today that she called him on 11/17 to advise that he stay until Monday, 11/18 to which he responded he would not have a ride and preferred to be discharged on 11/17 and he felt comfortable managing his insulin regimen at home with follow-up outpatient.  Today, patient states he does recall that conversation. -Patient and wife state they were told by the vascular doctors that he needed to be hospitalized until Monday or Tuesday (11/18 or 11/19).  They also expressed frustration that they were not advised of any wound care to do at home after the hospitalization.  I informed them that I was in the room on 11/17 with Dr. Sharion Dove when he called and spoke with Dr. Randie Heinz (vascular) on speaker phone who stated patient could be discharged and that he should not change his stump bandage until he follows up with vascular outpatient which was very clearly documented in the discharge instructions. -Patient and wife expressed frustration that he was not discharged with crutches or wheelchair.  Crutches were never recommended by PT.  A wheelchair was recommended by PT and Dr. Sharion Dove documented that the patient said he had a wheelchair at home already.  We discussed that our office could order crutches to be mailed to his home if he decides he wants to use them although they were never recommended by PT.

## 2023-09-08 NOTE — Telephone Encounter (Signed)
Today, the patient's wife visited the clinic to pick up crutches for him. Unfortunately, she was unable to sign for them, which caused her frustration. She requested that I call her and [place] later to discuss the matter. As planned, we called at 3:30 PM. I introduced myself and Dr. Yetta Barre to [place] and his wife. During the conversation, the patient's wife expressed confusion about why he was not discharged with crutches. We clarified that the physical therapist had recommended a wheelchair, a decision the patient understood and agreed with at the time. He had informed the intern that he already had a wheelchair at home and did not require another one. The patient mentioned that he contacted the vascular team, who stated that he should have been discharged on Monday instead of Sunday. However, we did not receive this update from the vascular team.  Dr. Sharion Dove (intern) proactively discussed the discharge plan with the vascular team to ensure coordinated care. I reminded the patient that I had advised him to stay until Monday to monitor his blood pressure and hemoglobin levels, but he insisted on being discharged Sunday. The patient was visibly frustrated throughout the call. Later in the conversation, the patient's wife informed us that he could not attend his appointment tomorrow and requested the appointment be canceled. She also requested a DME order for a wheelchair. I reiterated that the physical therapist recommended a wheelchair, but crutches would be ordered instead per their request. The patient's wife expressed appreciation for the follow-up call.

## 2023-09-08 NOTE — Telephone Encounter (Signed)
I called to check on the patient. He was unable to come in for his nurse appointment today. He is aware of his appointment tomorrow with Dr. Claudean Severance. He mentioned he has a different physician's name on his hospital discharge note other than our team. As discussed, I see our provider's name on his d/c summary. He will reach out to [facility] regarding this.  He has no additional questions.

## 2023-09-09 ENCOUNTER — Other Ambulatory Visit: Payer: Self-pay | Admitting: Student

## 2023-09-09 ENCOUNTER — Ambulatory Visit: Payer: Self-pay | Admitting: Student

## 2023-09-09 ENCOUNTER — Inpatient Hospital Stay: Payer: Self-pay | Admitting: Student

## 2023-09-09 NOTE — Telephone Encounter (Signed)
Receipt confirmed by [employer].   Beyounce Dickens C Christopherjame Carnell, RN  

## 2023-09-09 NOTE — Telephone Encounter (Signed)
Community message sent to [employer]. Will await response.   Jozlyn Schatz C January Bergthold, RN  

## 2023-09-13 ENCOUNTER — Telehealth: Payer: Self-pay | Admitting: Pharmacist

## 2023-09-13 NOTE — Telephone Encounter (Signed)
Patient contacted for follow-up of post-amputation glycemic control.  Dexcom G7 sensor report reviewed.   Average glucose 192 GMI 7.9 and 0% low Variation remains high at 37%  Since last contact patient reports doing well with good wound healing.  No change insulin therapy at this time.  Continue same dosing of insulin.  Shared that low reading may appear in future as stress of healing amputation diminishes.  Asked him to call us in the future if he was concerned about low reading.   While on the phone, patient inquired about restarting anti-HTN medications.  He is monitoring BP at home and reports systolic readings 120-140 mm Hg since surgery.  No restart at this time suggested.  Advised him that we would like consider restarting if top number exceeds 140 mmHg  Asked him to follow. Likely we could restart either amlodipine or hydrochlorothiazide in the future.   Total time with patient call and documentation of interaction: 14 minutes.  F/U Phone call planned: 6-8 weeks.

## 2023-09-13 NOTE — Telephone Encounter (Signed)
Reviewed and agree with Dr Koval's plan.   

## 2023-09-22 ENCOUNTER — Ambulatory Visit (INDEPENDENT_AMBULATORY_CARE_PROVIDER_SITE_OTHER): Payer: Self-pay | Admitting: Physician Assistant

## 2023-09-22 ENCOUNTER — Other Ambulatory Visit: Payer: Self-pay | Admitting: Student

## 2023-09-22 VITALS — BP 155/90 | HR 78 | Temp 98.1°F | Ht 68.0 in | Wt 205.0 lb

## 2023-09-22 DIAGNOSIS — Z89511 Acquired absence of right leg below knee: Secondary | ICD-10-CM

## 2023-09-22 DIAGNOSIS — I739 Peripheral vascular disease, unspecified: Secondary | ICD-10-CM

## 2023-09-22 DIAGNOSIS — R0982 Postnasal drip: Secondary | ICD-10-CM

## 2023-09-22 MED ORDER — TRAMADOL HCL 50 MG PO TABS: 50.0000 mg | ORAL_TABLET | Freq: Four times a day (QID) | ORAL | 0 refills | Status: DC | PRN

## 2023-09-22 NOTE — Progress Notes (Signed)
POST OPERATIVE OFFICE NOTE    CC:  F/u for surgery  HPI:  This is a 52 y.o. male who is s/p right below knee amputation on 09/02/23 by Dr. Myra Gianotti. He did very well post operatively   Pt returns today for follow up wit his significant other.  Pt states he has overall been doing well. They voiced their frustration with his discharge from hospital without any home health or DME items that he needed like crutches or a wheel chair. They since have obtained these things and are suppose to start [facility] PT tomorrow. Otherwise he says he has been fairly independent doing most of his ADLs. He is currently using wheel chair. He has struggled with stairs and has to scoot up them but otherwise gets around fairly easily. He has been having some phantom pains and also "cutting" incisional pain. This occurs mostly at night. He continues to take Tramadol at night only to help with this. He otherwise does not report any incisional concerns. No drainage or bleeding. He is very eager to get prosthesis and get back to work.   Allergies  Allergen Reactions   Scallops [Shellfish Allergy] Anaphylaxis   Penicillins Other (See Comments)    Unknown reaction as a child    Current Outpatient Medications  Medication Sig Dispense Refill   acetaminophen (TYLENOL) 325 MG tablet Take 2 tablets (650 mg total) by mouth every 6 (six) hours as needed for headache or mild pain. 90 tablet 2   aspirin EC 81 MG tablet Take 1 tablet (81 mg total) by mouth daily. Swallow whole. 30 tablet 12   atorvastatin (LIPITOR) 40 MG tablet Take 1 tablet by mouth once daily 90 tablet 1   cetirizine (ZYRTEC) 10 MG tablet Take 1 tablet (10 mg total) by mouth daily. 90 tablet 3   Continuous Glucose Sensor (DEXCOM G7 SENSOR) MISC 3 Devices by Does not apply route as directed. Apply new sensor every 10 days 3 each 11   famotidine (PEPCID) 40 MG tablet Take 1 tablet by mouth twice daily 60 tablet 3   ferrous sulfate 325 (65 FE) MG tablet Take 1 tablet  (325 mg total) by mouth every other day. 30 tablet 3   Insulin Glargine (BASAGLAR KWIKPEN) 100 UNIT/ML Inject 35 Units into the skin daily. 6 mL 1   insulin lispro (HUMALOG KWIKPEN) 100 UNIT/ML KwikPen Inject 14 Units into the skin with breakfast, with lunch, and with evening meal.     montelukast (SINGULAIR) 10 MG tablet TAKE 1 TABLET BY MOUTH AT BEDTIME 90 tablet 1   rOPINIRole (REQUIP) 1 MG tablet Take 1 tablet (1 mg total) by mouth at bedtime. 30 tablet 5   traMADol (ULTRAM) 50 MG tablet Take 1 tablet (50 mg total) by mouth every 6 (six) hours as needed. 20 tablet 0   albuterol (VENTOLIN HFA) 108 (90 Base) MCG/ACT inhaler Inhale 2 puffs into the lungs every 6 (six) hours as needed for wheezing or shortness of breath. (Patient not taking: Reported on 09/01/2023) 8 g 0   Blood Gluc Meter Disp-Strips (RELION ALL-IN-ONE) DEVI 1 each by Does not apply route 3 (three) times daily as needed. (Patient not taking: Reported on 07/01/2023) 1 each 3   blood glucose meter kit and supplies KIT Dispense based on patient and insurance preference. Use up to four times daily as directed. (Patient not taking: Reported on 07/01/2023) 1 each 0   Blood Glucose Monitoring Suppl DEVI 1 each by Does not apply route in the  morning, at noon, and at bedtime. May substitute to any manufacturer covered by patient's insurance. (Patient not taking: Reported on 07/01/2023) 1 each 0   ipratropium (ATROVENT) 0.03 % nasal spray Place 2 sprays into both nostrils every 12 (twelve) hours. (Patient not taking: Reported on 09/01/2023) 30 mL 12   ReliOn Lancets Thin 26G MISC 1 each by Does not apply route 3 (three) times daily as needed. (Patient not taking: Reported on 07/01/2023) 1 each 3   ULTICARE INSULIN SYRINGE 30G X 1/2" 0.3 ML MISC See admin instructions.  (Patient not taking: Reported on 07/01/2023)     No current facility-administered medications for this visit.    ROS:  See HPI  Physical Exam:  Vitals:   09/22/23 0934  BP:  (!) 155/90  Pulse: 78  Temp: 98.1 F (36.7 C)  SpO2: 97%   General: well appearing, well nourished Incision:  right BKA incision is healing very well. Staples removed. Dry Dressings and ACE reapplied Extremities:  BLE well perfused and warm  Neuro: alert and oriented  Assessment/Plan:  This is a 52 y.o. male who is s/p: right BKA on 09/02/23 by Dr. Myra Gianotti. He overall is doing very well. He starts his outpatient PT tomorrow 09/23/23. Staples were removed at today's visit. His BKA is healing very well. Continue to clean daily with mild soap and water. He is okay to keep it open to air as needed or place dressings if any concerns of keeping it clean and protected   He does continue to have some phantom and incisional pain. This occurs  mostly at night. He has been taking Tramadol to help with this as needed. He has only 3 pills left. I did provide him with a refill of his tramadol #20. No refills. I advised him that we would not be able to give him any more after this visit. He voiced his understanding.   He will follow up with [place] in 3 months with ABI that way we can continue to follow his PAD in the [place]. He knows to call prior if he has any concerns. I will make referral to hanger to start prosthesis process.   -The patient has a right Below Knee Amputation. The patient is well motivated to return to their prior functional status by utilizing a prosthesis to perform ADL's and maintain a healthy lifestyle. The patient has the physical and cognitive capacity to function with a prosthesis.   Functional Level: K3 Community Ambulator: Has the ability or potential for ambulation with variable cadence, to traverse most environmental barriers, and may have vocational, therapeutic, or exercise activity that demands prosthetic utilization beyond simple locomotion. Pt may benefit from Human resources officer.   Residual Limb History: The skin condition of the residual limb is healthy. The patient will  continue to monitor the skin of the residual limb and follow hygiene instructions. The patient is experiencing phantom limb pain  and residual limb pain  Prosthetic Prescription Plan: Counseling and education regarding prosthetic management will be provided to the patient via a certified prosthetist. A multi-discipline team, including physical therapy, will manage the prosthetic fabrication, fitting and prosthetic gait training.    Nathanial Rancher, [facility] [phone]   Clinic MD:  Randie Heinz

## 2023-09-23 ENCOUNTER — Ambulatory Visit: Payer: Self-pay

## 2023-09-24 ENCOUNTER — Other Ambulatory Visit: Payer: Self-pay

## 2023-09-24 DIAGNOSIS — I739 Peripheral vascular disease, unspecified: Secondary | ICD-10-CM

## 2023-09-27 ENCOUNTER — Telehealth: Payer: Self-pay | Admitting: Pharmacist

## 2023-09-27 NOTE — Telephone Encounter (Signed)
Reviewed and agree with Dr Koval's plan.   

## 2023-09-27 NOTE — Telephone Encounter (Signed)
Patient contacted for follow-up of CGM request and dosing guidance.   Since last contact patient reports glucose readings are doing better with many readings in the 100-200 range.   He requested CGM - Dexcom G7 assistance due to his insurance being in transition.  3 sensors placed at front desk for pick-up 12/7  Also clarified that hydrochlorothiazide max dose per day is 25mg  and he should not increase dose past 25mg .  If his non-surgical foot remains swollen despite use of hydrochlorothiazide 25mg  I suggested he return for evaluation.  He agreed with plans above.   Total time with patient call and documentation of interaction: 14 minutes.  F/U Phone call planned: None

## 2023-10-01 ENCOUNTER — Ambulatory Visit (INDEPENDENT_AMBULATORY_CARE_PROVIDER_SITE_OTHER): Payer: Self-pay | Admitting: Student

## 2023-10-01 ENCOUNTER — Ambulatory Visit: Payer: Self-pay | Attending: Family Medicine | Admitting: Physical Therapy

## 2023-10-01 VITALS — BP 134/76 | HR 82 | Ht 68.0 in

## 2023-10-01 DIAGNOSIS — I152 Hypertension secondary to endocrine disorders: Secondary | ICD-10-CM

## 2023-10-01 DIAGNOSIS — E1159 Type 2 diabetes mellitus with other circulatory complications: Secondary | ICD-10-CM

## 2023-10-01 DIAGNOSIS — S88111D Complete traumatic amputation at level between knee and ankle, right lower leg, subsequent encounter: Secondary | ICD-10-CM

## 2023-10-01 DIAGNOSIS — Z7985 Long-term (current) use of injectable non-insulin antidiabetic drugs: Secondary | ICD-10-CM

## 2023-10-01 MED ORDER — HYDROCHLOROTHIAZIDE 25 MG PO TABS: 25.0000 mg | ORAL_TABLET | Freq: Every day | ORAL | 3 refills | Status: DC

## 2023-10-01 MED ORDER — AMLODIPINE BESYLATE 10 MG PO TABS: 10.0000 mg | ORAL_TABLET | Freq: Every day | ORAL | 1 refills | Status: DC

## 2023-10-01 NOTE — Patient Instructions (Signed)
It was great to see you! Thank you for allowing me to participate in your care!   I recommend that you always bring your medications to each appointment as this makes it easy to ensure we are on the correct medications and helps [place] not miss when refills are needed.  Our plans for today:  - Please take 5.0 mg of amlodipine daily, and 25 mg of hydrochlorothiazide - Follow-up in ~2 weeks for blood pressure recheck - I have provided the paper work for disability placard - If you have any concerns, do not hesitate to call or schedule an appointment   Take care and seek immediate care sooner if you develop any concerns. Please remember to show up 15 minutes before your scheduled appointment time!  Tiffany Kocher, DO [facility]

## 2023-10-01 NOTE — Progress Notes (Signed)
    SUBJECTIVE:   CHIEF COMPLAINT / HPI:   Hospital follow-up Patient presents for hospital follow-up after a right BKA.  Recently saw vascular surgery, who removed the staples.  BKA is healing well, and he was cleared from vascular surgery standpoint.  He has a follow-up in 3 months for ABI to continue to follow his PAD and LLE.  He has an appointment on December 23 for orthotics.  He is requesting handicap placard.  Since his discharge she has had no: Fevers, chills, cough, chest pain, shortness of breath, NVD.  Hypertension During hospitalization patient has low blood pressures and blood pressure medication was discontinued.  He reports that he has restarted his 10 mg of amlodipine and 25 mg of hydrochlorothiazide at home, and his blood pressure today is within goal.    OBJECTIVE:   BP 134/76   Pulse 82   Ht 5\' 8"  (1.727 m)   SpO2 100%   BMI 31.17 kg/m    General: NAD, pleasant Cardio: RRR, no MRG. Cap Refill <2s. Respiratory: CTAB, normal wob on RA GI: Abdomen is soft, not tender, not distended. BS present MSK: Right BKA. Incision site is clean, dry and healing well. Skin: Warm and dry  ASSESSMENT/PLAN:   Assessment & Plan Hypertension associated with diabetes ([name]) Blood pressure well-controlled today.  Patient has restarted blood pressure medications at home. - Refill of amlodipine 10 mg and hydrochlorothiazide 25 mg daily Unilateral complete BKA, right, subsequent encounter ([name]) Well healed right BKA.  Has follow-up with vascular for ABIs in 3 months, and appointment with orthotics. - Handicap placard paperwork completed - Follow-up with vascular surgery - Follow-up with PCP as needed   Tiffany Kocher, DO [facility]

## 2023-10-01 NOTE — Assessment & Plan Note (Signed)
Blood pressure well-controlled today.  Patient has restarted blood pressure medications at home. - Refill of amlodipine 10 mg and hydrochlorothiazide 25 mg daily

## 2023-10-01 NOTE — Therapy (Incomplete)
OUTPATIENT PHYSICAL THERAPY LOWER EXTREMITY EVALUATION   Patient Name: Sean Carter MRN: [id number] DOB:08-07-1971, 52 y.o., male Today's Date: 10/01/2023  END OF SESSION:   Past Medical History:  Diagnosis Date   Blurry vision 07/10/2020   Diabetes mellitus without complication ([name])    Gangrene of toe of left foot ([name])    Peripheral arterial disease ([name])    Sepsis due to Streptococcus, group B ([name]) 06/20/2020   Streptococcal bacteremia    Past Surgical History:  Procedure Laterality Date   ABDOMINAL AORTOGRAM W/LOWER EXTREMITY N/A 01/18/2023   Procedure: ABDOMINAL AORTOGRAM W/LOWER EXTREMITY;  Surgeon: Chuck Hint, MD;  Location: [facility] INVASIVE CV LAB;  Service: Cardiovascular;  Laterality: N/A;   AMPUTATION Left 05/31/2020   Procedure: Ray Amputation of Left Great Toe with Negative Pressure Vac Placement;  Surgeon: Cephus Shelling, MD;  Location: [facility] OR;  Service: Vascular;  Laterality: Left;   AMPUTATION Right 07/22/2022   Procedure: RIGHT FIRST AND SECOND RAY PARTIAL  AMPUTATION;  Surgeon: Leonie Douglas, MD;  Location: [facility] OR;  Service: Vascular;  Laterality: Right;   AMPUTATION Right 09/02/2023   Procedure: AMPUTATION BELOW KNEE;  Surgeon: Nada Libman, MD;  Location: [facility] OR;  Service: Vascular;  Laterality: Right;   APPLICATION OF WOUND VAC Right 01/22/2023   Procedure: APPLICATION OF WOUND VAC, RIGHT FOOT;  Surgeon: Maeola Harman, MD;  Location: [facility] OR;  Service: Vascular;  Laterality: Right;   INCISION AND DRAINAGE OF WOUND Right 01/22/2023   Procedure: WASHOUT OF RIGHT FOOT WOUND;  Surgeon: Maeola Harman, MD;  Location: [facility] OR;  Service: Vascular;  Laterality: Right;   PERIPHERAL VASCULAR BALLOON ANGIOPLASTY  01/18/2023   Procedure: PERIPHERAL VASCULAR BALLOON ANGIOPLASTY;  Surgeon: Chuck Hint, MD;  Location: [facility] INVASIVE CV LAB;  Service: Cardiovascular;;   STUMP REVISION Right 01/22/2023   Procedure: PARTIAL CLOSURE OF RIGHT  FOOT WOUND;  Surgeon: Maeola Harman, MD;  Location: [facility] OR;  Service: Vascular;  Laterality: Right;   TRANSMETATARSAL AMPUTATION Right 01/19/2023   Procedure: TRANSMETATARSAL AMPUTATION;  Surgeon: Maeola Harman, MD;  Location: [facility] OR;  Service: Vascular;  Laterality: Right;   Patient Active Problem List   Diagnosis Date Noted   S/P BKA (below knee amputation) unilateral, right ([name]) 09/07/2023   Wound infection 09/03/2023   Type 2 diabetes mellitus ([name]) 09/02/2023   Elevated LFTs 09/02/2023   Normocytic anemia 09/02/2023   Subacute cough 09/02/2023   Sepsis with acute organ dysfunction without septic shock ([name]) 09/01/2023   Restless legs syndrome 06/25/2023   Hyperkalemia 02/18/2023   S/P transmetatarsal amputation of foot, right ([name]) 01/31/2023   Transaminitis 01/24/2023   AKI (acute kidney injury) ([name]) 01/16/2023   Diabetic infection of right foot ([name]) 01/16/2023   Open wound of plantar aspect of foot 01/16/2023   Restrictive lung disease 01/15/2023   History of wheezing 08/28/2022   Symptoms of gastroesophageal reflux 08/28/2022   Leukocytosis 07/20/2022   Diabetic retinopathy ([name]) 06/29/2022   Mixed hyperlipidemia 11/20/2021   Blurry vision 07/10/2020   Superficial vein thrombosis 06/06/2020   Hypertension associated with diabetes ([name]) 06/06/2020   Diabetes mellitus type 2 with peripheral artery disease ([name]) 06/06/2020   Amputation of left great toe ([name])    Hyponatremia 05/30/2020    PCP: Barbaraann Faster, MD  REFERRING PROVIDER: Lum Babe, MD  REFERRING DIAG: Debility, weakness, S/P BKA  THERAPY DIAG:  No diagnosis found.  Rationale for Evaluation and Treatment: Rehabilitation  ONSET DATE: 09/02/23  SUBJECTIVE:  SUBJECTIVE STATEMENT: ***  PERTINENT HISTORY:     Brief Hospital Course:  Sean Carter is a 52 y.o.male with a history of HTN, DM2, diabetic foot wounds, restless leg, and HLD, who was admitted to the [facility] Medicine Teaching  Service at [facility] for a R diabetic foot wound. His hospital course is detailed below:   Sepsis  Right diabetic foot wound S/p Right BKA Presented with fever, leukocytosis, and elevated lactic acid most likely due to right foot ulcer. Started on broad spectrum Abx and got 3 L fluid boluses. Vascular surgery consulted, recommended BKA which was done on 11/14.   PAIN:  Are you having pain? Yes: NPRS scale: ***/10 Pain location: *** Pain description: *** Aggravating factors: *** Relieving factors: ***  PRECAUTIONS: {Therapy precautions:24002}  RED FLAGS: {PT Red Flags:29287}   WEIGHT BEARING RESTRICTIONS: {Yes ***/No:24003}  FALLS:  Has patient fallen in last 6 months? {fallsyesno:27318}  LIVING ENVIRONMENT: Lives with: {OPRC lives with:25569::"lives with their family"} Lives in: {Lives in:25570} Stairs: {opstairs:27293} Has following equipment at home: {Assistive devices:23999}  OCCUPATION: ***  PLOF: {PLOF:24004}  PATIENT GOALS: ***  NEXT MD VISIT: ***  OBJECTIVE:  Note: Objective measures were completed at Evaluation unless otherwise noted.  DIAGNOSTIC FINDINGS: ***  PATIENT SURVEYS:  {rehab surveys:24030}  COGNITION: Overall cognitive status: {cognition:24006}     SENSATION: {sensation:27233}  EDEMA:  {edema:24020}  MUSCLE LENGTH: Hamstrings: Right *** deg; Left *** deg Sean Carter test: Right *** deg; Left *** deg  POSTURE: {posture:25561}  PALPATION: ***  LOWER EXTREMITY ROM:  Active ROM Right eval Left eval  Hip flexion    Hip extension    Hip abduction    Hip adduction    Hip internal rotation    Hip external rotation    Knee flexion    Knee extension    Ankle dorsiflexion    Ankle plantarflexion    Ankle inversion    Ankle eversion     (Blank rows = not tested)  LOWER EXTREMITY MMT:  MMT Right eval Left eval  Hip flexion    Hip extension    Hip abduction    Hip adduction    Hip internal rotation    Hip external rotation     Knee flexion    Knee extension    Ankle dorsiflexion    Ankle plantarflexion    Ankle inversion    Ankle eversion     (Blank rows = not tested)  LOWER EXTREMITY SPECIAL TESTS:  {LEspecialtests:26242}  FUNCTIONAL TESTS:  {Functional tests:24029}  GAIT: Distance walked: *** Assistive device utilized: {Assistive devices:23999} Level of assistance: {Levels of assistance:24026} Comments: ***   TODAY'S TREATMENT:                                                                                                                              DATE: ***    PATIENT EDUCATION:  Education details: *** Person educated: {Person educated:25204} Education method: {Education Method:25205} Education comprehension: {  Education Comprehension:25206}  HOME EXERCISE PROGRAM: ***  ASSESSMENT:  CLINICAL IMPRESSION: Patient is a 52 y.o. male who was seen today for physical therapy evaluation and treatment for ***.   OBJECTIVE IMPAIRMENTS: Abnormal gait, cardiopulmonary status limiting activity, decreased activity tolerance, decreased balance, decreased coordination, decreased endurance, decreased knowledge of condition, decreased mobility, difficulty walking, decreased ROM, decreased strength, increased muscle spasms, impaired flexibility, postural dysfunction, and pain.  REHAB POTENTIAL: Good  CLINICAL DECISION MAKING: Evolving/moderate complexity  EVALUATION COMPLEXITY: Moderate   GOALS: Goals reviewed with patient? Yes  SHORT TERM GOALS: Target date: 10/19/23 Independent with initial HEP Goal status: INITIAL  LONG TERM GOALS: Target date: ***  *** Baseline:  Goal status: INITIAL  2.  *** Baseline:  Goal status: INITIAL  3.  *** Baseline:  Goal status: INITIAL  4.  *** Baseline:  Goal status: INITIAL  5.  *** Baseline:  Goal status: INITIAL  6.  *** Baseline:  Goal status: INITIAL   PLAN:  PT FREQUENCY: 1-2x/week  PT DURATION: 12 weeks  PLANNED  INTERVENTIONS: 97164- PT Re-evaluation, 97110-Therapeutic exercises, 97530- Therapeutic activity, 97112- Neuromuscular re-education, 97535- Self Care, 16109- Manual therapy, [id number]- Gait training, Patient/Family education, and Balance training  PLAN FOR NEXT SESSION: Jearld Lesch, PT 10/01/2023, 7:41 AM

## 2023-10-04 ENCOUNTER — Telehealth: Payer: Self-pay

## 2023-10-04 ENCOUNTER — Ambulatory Visit: Payer: Self-pay | Admitting: Occupational Therapy

## 2023-10-04 NOTE — Therapy (Unsigned)
OUTPATIENT OCCUPATIONAL THERAPY ORTHO EVALUATION  Patient Name: Sean Carter MRN: [id number] DOB:11/22/1970, 52 y.o., male Today's Date: 10/04/2023  PCP: *** REFERRING PROVIDER: ***  END OF SESSION:   Past Medical History:  Diagnosis Date   Blurry vision 07/10/2020   Diabetes mellitus without complication ([name])    Gangrene of toe of left foot ([name])    Peripheral arterial disease ([name])    Sepsis due to Streptococcus, group B ([name]) 06/20/2020   Streptococcal bacteremia    Past Surgical History:  Procedure Laterality Date   ABDOMINAL AORTOGRAM W/LOWER EXTREMITY N/A 01/18/2023   Procedure: ABDOMINAL AORTOGRAM W/LOWER EXTREMITY;  Surgeon: Chuck Hint, MD;  Location: [facility] INVASIVE CV LAB;  Service: Cardiovascular;  Laterality: N/A;   AMPUTATION Left 05/31/2020   Procedure: Ray Amputation of Left Great Toe with Negative Pressure Vac Placement;  Surgeon: Cephus Shelling, MD;  Location: [facility] OR;  Service: Vascular;  Laterality: Left;   AMPUTATION Right 07/22/2022   Procedure: RIGHT FIRST AND SECOND RAY PARTIAL  AMPUTATION;  Surgeon: Leonie Douglas, MD;  Location: [facility] OR;  Service: Vascular;  Laterality: Right;   AMPUTATION Right 09/02/2023   Procedure: AMPUTATION BELOW KNEE;  Surgeon: Nada Libman, MD;  Location: [facility] OR;  Service: Vascular;  Laterality: Right;   APPLICATION OF WOUND VAC Right 01/22/2023   Procedure: APPLICATION OF WOUND VAC, RIGHT FOOT;  Surgeon: Maeola Harman, MD;  Location: [facility] OR;  Service: Vascular;  Laterality: Right;   INCISION AND DRAINAGE OF WOUND Right 01/22/2023   Procedure: WASHOUT OF RIGHT FOOT WOUND;  Surgeon: Maeola Harman, MD;  Location: [facility] OR;  Service: Vascular;  Laterality: Right;   PERIPHERAL VASCULAR BALLOON ANGIOPLASTY  01/18/2023   Procedure: PERIPHERAL VASCULAR BALLOON ANGIOPLASTY;  Surgeon: Chuck Hint, MD;  Location: [facility] INVASIVE CV LAB;  Service: Cardiovascular;;   STUMP REVISION Right 01/22/2023    Procedure: PARTIAL CLOSURE OF RIGHT FOOT WOUND;  Surgeon: Maeola Harman, MD;  Location: [facility] OR;  Service: Vascular;  Laterality: Right;   TRANSMETATARSAL AMPUTATION Right 01/19/2023   Procedure: TRANSMETATARSAL AMPUTATION;  Surgeon: Maeola Harman, MD;  Location: [facility] OR;  Service: Vascular;  Laterality: Right;   Patient Active Problem List   Diagnosis Date Noted   S/P BKA (below knee amputation) unilateral, right ([name]) 09/07/2023   Wound infection 09/03/2023   Type 2 diabetes mellitus ([name]) 09/02/2023   Elevated LFTs 09/02/2023   Normocytic anemia 09/02/2023   Subacute cough 09/02/2023   Sepsis with acute organ dysfunction without septic shock ([name]) 09/01/2023   Restless legs syndrome 06/25/2023   Hyperkalemia 02/18/2023   S/P transmetatarsal amputation of foot, right ([name]) 01/31/2023   Transaminitis 01/24/2023   AKI (acute kidney injury) ([name]) 01/16/2023   Diabetic infection of right foot ([name]) 01/16/2023   Open wound of plantar aspect of foot 01/16/2023   Restrictive lung disease 01/15/2023   History of wheezing 08/28/2022   Symptoms of gastroesophageal reflux 08/28/2022   Leukocytosis 07/20/2022   Diabetic retinopathy ([name]) 06/29/2022   Mixed hyperlipidemia 11/20/2021   Blurry vision 07/10/2020   Superficial vein thrombosis 06/06/2020   Hypertension associated with diabetes ([name]) 06/06/2020   Diabetes mellitus type 2 with peripheral artery disease ([name]) 06/06/2020   Amputation of left great toe ([name])    Hyponatremia 05/30/2020    ONSET DATE: ***  REFERRING DIAG:  Diagnosis  Z89.511 (ICD-10-CM) - S/P below knee amputation, right ([name])    THERAPY DIAG:  No diagnosis found.  Rationale for Evaluation and  Treatment: {HABREHAB:27488}  SUBJECTIVE:   SUBJECTIVE STATEMENT: *** Pt accompanied by: {accompnied:27141}  PERTINENT HISTORY: ***  PRECAUTIONS: {Therapy precautions:24002} Pt is 52 yo presenting to [facility] ED on 09/01/23 for R foot ulcer that has  been present for weeks. Pt was trying conservative care. Whole leg had become edematous and erythematous from shin down over the last 5 days with fevers, chills, nausea and vomiting. Pt currently s/p R BKA on 09/02/23. PMH: DM II, Grangrene of L foot, sepsis.  RED FLAGS: {PT Red Flags:29287}   WEIGHT BEARING RESTRICTIONS: {Yes ***/No:24003}  PAIN:  Are you having pain? {OPRCPAIN:27236}  FALLS: Has patient fallen in last 6 months? {fallsyesno:27318}  LIVING ENVIRONMENT: Lives with: {OPRC lives with:25569::"lives with their family"} Lives in: {Lives in:25570} Stairs: {opstairs:27293} Has following equipment at home: {Assistive devices:23999}  PLOF: {PLOF:24004}  PATIENT GOALS: ***  NEXT MD VISIT: ***  OBJECTIVE:  Note: Objective measures were completed at Evaluation unless otherwise noted.  HAND DOMINANCE: {MISC; OT HAND DOMINANCE:[phone]}  ADLs: {ADLs OT:31716}  FUNCTIONAL OUTCOME MEASURES: {OTFUNCTIONALMEASURES:27238}  UPPER EXTREMITY ROM:     {AROM/PROM:27142} ROM Right eval Left eval  Shoulder flexion    Shoulder abduction    Shoulder adduction    Shoulder extension    Shoulder internal rotation    Shoulder external rotation    Elbow flexion    Elbow extension    Wrist flexion    Wrist extension    Wrist ulnar deviation    Wrist radial deviation    Wrist pronation    Wrist supination    (Blank rows = not tested)  {AROM/PROM:27142} ROM Right eval Left eval  Thumb MCP (0-60)    Thumb IP (0-80)    Thumb Radial abd/add (0-55)     Thumb Palmar abd/add (0-45)     Thumb Opposition to Small Finger     Index MCP (0-90)     Index PIP (0-100)     Index DIP (0-70)      Long MCP (0-90)      Long PIP (0-100)      Long DIP (0-70)      Ring MCP (0-90)      Ring PIP (0-100)      Ring DIP (0-70)      Little MCP (0-90)      Little PIP (0-100)      Little DIP (0-70)      (Blank rows = not tested)   UPPER EXTREMITY MMT:     MMT Right eval Left eval   Shoulder flexion    Shoulder abduction    Shoulder adduction    Shoulder extension    Shoulder internal rotation    Shoulder external rotation    Middle trapezius    Lower trapezius    Elbow flexion    Elbow extension    Wrist flexion    Wrist extension    Wrist ulnar deviation    Wrist radial deviation    Wrist pronation    Wrist supination    (Blank rows = not tested)  HAND FUNCTION: {handfunction:27230}  COORDINATION: {otcoordination:27237}  SENSATION: {sensation:27233}  EDEMA: ***  COGNITION: Overall cognitive status: {cognition:24006} Areas of impairment: {impairedcognition:27234}  OBSERVATIONS: ***   TODAY'S TREATMENT:  DATE: ***    PATIENT EDUCATION: Education details: *** Person educated: {Person educated:25204} Education method: {Education Method:25205} Education comprehension: {Education Comprehension:25206}  HOME EXERCISE PROGRAM: ***  GOALS: Goals reviewed with patient? {yes/no:20286}  SHORT TERM GOALS: Target date: ***  *** Baseline: Goal status: INITIAL  2.  *** Baseline:  Goal status: INITIAL  3.  *** Baseline:  Goal status: INITIAL  4.  *** Baseline:  Goal status: INITIAL  5.  *** Baseline:  Goal status: INITIAL  6.  *** Baseline:  Goal status: INITIAL  LONG TERM GOALS: Target date: ***  *** Baseline:  Goal status: INITIAL  2.  *** Baseline:  Goal status: INITIAL  3.  *** Baseline:  Goal status: INITIAL  4.  *** Baseline:  Goal status: INITIAL  5.  *** Baseline:  Goal status: INITIAL  6.  *** Baseline:  Goal status: INITIAL  ASSESSMENT:  CLINICAL IMPRESSION: Patient is a *** y.o. *** who was seen today for occupational therapy evaluation for ***.   PERFORMANCE DEFICITS: in functional skills including {OT physical skills:25468}, cognitive skills including {OT cognitive  skills:25469}, and psychosocial skills including {OT psychosocial skills:25470}.   IMPAIRMENTS: are limiting patient from {OT performance deficits:25471}.   COMORBIDITIES: {Comorbidities:25485} that affects occupational performance. Patient will benefit from skilled OT to address above impairments and improve overall function.  MODIFICATION OR ASSISTANCE TO COMPLETE EVALUATION: {OT modification:25474}  OT OCCUPATIONAL PROFILE AND HISTORY: {OT PROFILE AND HISTORY:25484}  CLINICAL DECISION MAKING: {OT CDM:25475}  REHAB POTENTIAL: {rehabpotential:25112}  EVALUATION COMPLEXITY: {Evaluation complexity:25115}      PLAN:  OT FREQUENCY: {rehab frequency:25116}  OT DURATION: {rehab duration:25117}  PLANNED INTERVENTIONS: {OT Interventions:25467}  RECOMMENDED OTHER SERVICES: ***  CONSULTED AND AGREED WITH PLAN OF CARE: {VWU:98119}  PLAN FOR NEXT SESSION: ***   Keene Breath, OT 10/04/2023, 12:07 PM

## 2023-10-04 NOTE — Telephone Encounter (Signed)
Pt called stating that he has been waiting on PT/OT and hasn't received anything more than 10 min in the hospital, which is "ridiculous". He didn't know which company was given his case, but he hadn't heard from anyone.  Called Aggie Cosier Dabbs with [facility] who had been given the referral. She stated that she had called the pt on Thurs, 11/21 and the pt told him that he had BCBS from [facility], but couldn't provide her the ID/Group #. She called him on Fri, 11/22, but he asked her to call back after 3 PM. She did, she stated that someone picked up, but hung up immediately. She called right back, but no answer, and left a vm. The pt never called back with the information needed, so the referral was closed.  Called the pt, two identifiers used. Informed the pt that Clarise Cruz had tried to contact him and she needed his insurance information. Gave him her name and number, as directed by Aggie Cosier. Pt stated that he would call her.

## 2023-10-22 ENCOUNTER — Telehealth: Payer: Self-pay | Admitting: Pharmacist

## 2023-10-22 DIAGNOSIS — E1151 Type 2 diabetes mellitus with diabetic peripheral angiopathy without gangrene: Secondary | ICD-10-CM

## 2023-10-22 DIAGNOSIS — Z794 Long term (current) use of insulin: Secondary | ICD-10-CM

## 2023-10-22 MED ORDER — INSULIN LISPRO (1 UNIT DIAL) 100 UNIT/ML (KWIKPEN): 12.0000 [IU] | PEN_INJECTOR | Freq: Three times a day (TID) | SUBCUTANEOUS | Status: DC

## 2023-10-22 MED ORDER — BASAGLAR KWIKPEN 100 UNIT/ML ~~LOC~~ SOPN: 33.0000 [IU] | PEN_INJECTOR | Freq: Every day | SUBCUTANEOUS | Status: DC

## 2023-10-22 NOTE — Telephone Encounter (Signed)
 Patient calls with concern related to highly variable glucose readings.   Review of Dexcom % Time CGM is active: 98.5% Average Glucose: 217 mg/dL Glucose Management Indicator: 8.5  Glucose Variability: 28.1% (goal <36%) Time in Goal:  - Time in range 70-180: 30% - Time above range: 70% (41% 180-250) - Time below range: 0% Observed patterns:  Late evening elevation, intermittent AM elevation (likely related to increased intake - breakfast with eggs and meat)   Since last contact patient reports feeling well, wound healing  Current Medications include: Basaglar  33 units once daily Humalog  12-14 units prior to meals Patient denies any significant medication related side effects.  Medication Plan: -Continue Basaglar  (insulin  glargine)  33 units daily.  - Increase Humalog  (insulin  lispro) 12-14 units with breakfast, 12 units with lunch, and 16 units with dinner.  Asked patient to adjust (slide) as appropriate with meal/carbohydrate intake and also asked him to more consistently dose prandial insulin  closer to meal time.    Total time with patient call and documentation of interaction: 12 minutes.  F/U Phone call planned: none Patient plans to schedule back with PCP

## 2023-10-22 NOTE — Assessment & Plan Note (Signed)
 Average Glucose: 217 mg/dL Glucose Management Indicator: 8.5  Glucose Variability: 28.1% (goal <36%) Time in Goal:  - Time in range 70-180: 30% - Time above range: 70% (41% 180-250) - Time below range: 0% Observed patterns:  Late evening elevation, intermittent AM elevation (likely related to increased intake - breakfast with eggs and meat)   Since last contact patient reports feeling well, wound healing  Current Medications include: Basaglar  33 units once daily Humalog  12-14 units prior to meals Patient denies any significant medication related side effects.  Medication Plan: -Continue Basaglar  (insulin  glargine)  33 units daily.  - Increase Humalog  (insulin  lispro) 12-14 units with breakfast, 12 units with lunch, and 16 units with dinner.  Asked patient to adjust (slide) as appropriate with meal/carbohydrate intake and also asked him to more consistently dose prandial insulin  closer to meal time.

## 2023-10-24 ENCOUNTER — Encounter: Payer: Self-pay | Admitting: Student

## 2023-10-25 NOTE — Telephone Encounter (Signed)
 Reviewed and agree with Dr Macky Lower plan.

## 2023-10-27 ENCOUNTER — Encounter: Payer: Self-pay | Admitting: Student

## 2023-11-01 ENCOUNTER — Telehealth: Payer: Self-pay | Admitting: Pharmacist

## 2023-11-01 DIAGNOSIS — E1159 Type 2 diabetes mellitus with other circulatory complications: Secondary | ICD-10-CM

## 2023-11-01 DIAGNOSIS — E1151 Type 2 diabetes mellitus with diabetic peripheral angiopathy without gangrene: Secondary | ICD-10-CM

## 2023-11-01 MED ORDER — BASAGLAR KWIKPEN 100 UNIT/ML ~~LOC~~ SOPN: 38.0000 [IU] | PEN_INJECTOR | Freq: Every day | SUBCUTANEOUS | Status: DC

## 2023-11-01 NOTE — Telephone Encounter (Signed)
 Patient contacted for follow-up of request for CGM - Dexcom G7   Since last contact patient reports his leg is healing well and he anticipates getting his prosthesis in about 1-2 weeks  Current Medications include:  Bolus Insulin  12-14/ 12/ 14-16 with the 3 meals of the day. Basal insulin  33 units daily.  Patient denies any significant medication related side effects.  CGM data review reveals low variability of ~ 27% and  Glucose Average ~ 212  Medication Plan: -Increase dose of Basaglar  from 33 to 38 units in the morning.   Two additional sensors supplied for pick-up at front desk.    Total time with patient call and documentation of interaction: 9 minutes.  F/U Phone call planned: PRN if low readings.  Reviewed if low readings (<100) happen prior to next visit - asked to call.

## 2023-11-01 NOTE — Assessment & Plan Note (Signed)
 Patient contacted for follow-up of request for CGM - Dexcom G7   Since last contact patient reports his leg is healing well and he anticipates getting his prosthesis in about 1-2 weeks  Current Medications include:  Bolus Insulin  12-14/ 12/ 14-16 with the 3 meals of the day. Basal insulin  33 units daily.  Patient denies any significant medication related side effects.  CGM data review reveals low variability of ~ 27% and  Glucose Average ~ 212  Medication Plan: -Increase dose of Basaglar  from 33 to 38 units in the morning.   Two additional sensors supplied for pick-up at front desk.

## 2023-11-01 NOTE — Telephone Encounter (Signed)
 Reviewed and agree with Dr Macky Lower plan.

## 2023-11-05 ENCOUNTER — Other Ambulatory Visit: Payer: Self-pay | Admitting: Student

## 2023-11-05 DIAGNOSIS — E785 Hyperlipidemia, unspecified: Secondary | ICD-10-CM

## 2023-11-10 ENCOUNTER — Telehealth: Payer: Self-pay | Admitting: Pharmacist

## 2023-11-10 DIAGNOSIS — E1151 Type 2 diabetes mellitus with diabetic peripheral angiopathy without gangrene: Secondary | ICD-10-CM

## 2023-11-10 MED ORDER — BASAGLAR KWIKPEN 100 UNIT/ML ~~LOC~~ SOPN: 40.0000 [IU] | PEN_INJECTOR | Freq: Every day | SUBCUTANEOUS | Status: DC

## 2023-11-10 NOTE — Telephone Encounter (Signed)
Patient contacted for follow-up of glucose control post amputation.   Since last contact patient reports he is scheduled for P/T with his prosthesis starting on Friday.  States he has had his fitting and has walked with this prosthesis briefly.      Following review of Dexcom CGM report GMI = 8.0 and variability of 27% Average glucose 198  Patient agreed to increase long-acting basaglar from 38 to 40 units once daily.  Continued same doses with meals.   Scheduled visit with PCP, Dr. Barbaraann Faster 2/12  Does not need updated prescription yet as he has adequate supply of insulin through [facility].   Likely needs update in the next few months to ensure adequate supply.    Total time with patient call and documentation of interaction: 11 minutes.

## 2023-11-11 NOTE — Telephone Encounter (Signed)
Reviewed and agree with Dr Koval's plan.   

## 2023-11-24 ENCOUNTER — Telehealth: Payer: Self-pay | Admitting: Pharmacist

## 2023-11-24 NOTE — Telephone Encounter (Signed)
 Patient contacts office requesting samples of Dexcom G7  Since last contact patient reports eating better less snacking  CGM review  GMI = 7.2 Avg.   =  164  TIR = 62%  Encouraged continued effort with diet.  No medication changes today.   3 sample sensors placed at front desk for pick-up Patient will try to pick-up today.   Total time with patient call and documentation of interaction: 8 minutes.

## 2023-11-24 NOTE — Telephone Encounter (Signed)
 Reviewed and agree with Dr Macky Lower plan.

## 2023-12-01 ENCOUNTER — Ambulatory Visit: Payer: Self-pay | Admitting: Student

## 2023-12-09 ENCOUNTER — Telehealth: Payer: Self-pay | Admitting: Pharmacist

## 2023-12-09 NOTE — Telephone Encounter (Signed)
Patient contacted for follow-up of CGM review Noteworthy that late afternoon lows have been present on multiple days over the last two weeks.  Patient reports that he is trying to eat less.   Since last contact CGM data shows improvement of GMI to 6.6 No low readings overnight.   No changes today to regimen.  Patient was encourage to review glucose readings in the later PM and consider a small snack if readings < 100 and there was more than 1 hour until a planned meal.  Patient will try to review glucose values more consistently in the later PM   Total time with patient call and documentation of interaction: 12 minutes.  F/U Phone call planned: 1 month.

## 2023-12-09 NOTE — Telephone Encounter (Signed)
-----   Message from Madelon Lips sent at 11/29/2023  7:47 AM EST ----- Regarding: FW: CGM review and insulin regimen assessment GMI - 7.2 on 2/10 ----- Message ----- From: Kathrin Ruddy, RPH-CPP Sent: 11/29/2023  12:00 AM EST To: Kathrin Ruddy, RPH-CPP Subject: FW: CGM review and insulin regimen assessment  Review dosing ----- Message ----- From: Kathrin Ruddy, RPH-CPP Sent: 11/17/2023  12:00 AM EST To: Kathrin Ruddy, RPH-CPP Subject: CGM review and insulin regimen assessment

## 2023-12-14 NOTE — Telephone Encounter (Signed)
 Reviewed and agree with Dr Macky Lower plan.

## 2023-12-20 ENCOUNTER — Ambulatory Visit (INDEPENDENT_AMBULATORY_CARE_PROVIDER_SITE_OTHER): Payer: Self-pay | Admitting: Surgery

## 2023-12-20 ENCOUNTER — Encounter: Payer: Self-pay | Admitting: Surgery

## 2023-12-20 ENCOUNTER — Ambulatory Visit (HOSPITAL_COMMUNITY)
Admission: RE | Admit: 2023-12-20 | Discharge: 2023-12-20 | Disposition: A | Discharge status: Home / Self Care | Payer: Self-pay | Source: Ambulatory Visit | Attending: Vascular Surgery | Admitting: Vascular Surgery

## 2023-12-20 ENCOUNTER — Encounter (HOSPITAL_COMMUNITY): Payer: Self-pay

## 2023-12-20 VITALS — BP 126/83 | HR 68 | Temp 98.3°F | Ht 68.0 in | Wt 205.0 lb

## 2023-12-20 DIAGNOSIS — Z89511 Acquired absence of right leg below knee: Secondary | ICD-10-CM

## 2023-12-20 DIAGNOSIS — I739 Peripheral vascular disease, unspecified: Secondary | ICD-10-CM

## 2023-12-20 NOTE — Progress Notes (Signed)
 [facility] [place]  Patient name: Sean Carter MRN: [id number] DOB: 1970/12/23 Sex: male   REASON FOR VISIT:    Follow-up  HISOTRY OF PRESENT ILLNESS:    Sean Carter is a 53 y.o. male who is status post right below-knee amputation on 09/02/2023.  His amputation site has healed.  He is scheduled to get his prosthetic in the next week or 2.  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  He has no issues with his left leg.  He is working on control of his diabetes.   PAST MEDICAL HISTORY:   Past Medical History:  Diagnosis Date   Blurry vision 07/10/2020   Diabetes mellitus without complication ([name])    Gangrene of toe of left foot ([name])    Peripheral arterial disease ([name])    Sepsis due to Streptococcus, group B ([name]) 06/20/2020   Streptococcal bacteremia      FAMILY HISTORY:   History reviewed. No pertinent family history.  SOCIAL HISTORY:   Social History   Tobacco Use   Smoking status: Never    Passive exposure: Never   Smokeless tobacco: Never  Substance Use Topics   Alcohol use: Not on file     ALLERGIES:   Allergies  Allergen Reactions   Scallops [Shellfish Allergy] Anaphylaxis   Penicillins Other (See Comments)    Unknown reaction as a child     CURRENT MEDICATIONS:   Current Outpatient Medications  Medication Sig Dispense Refill   acetaminophen (TYLENOL) 325 MG tablet Take 2 tablets (650 mg total) by mouth every 6 (six) hours as needed for headache or mild pain. 90 tablet 2   albuterol (VENTOLIN HFA) 108 (90 Base) MCG/ACT inhaler Inhale 2 puffs into the lungs every 6 (six) hours as needed for wheezing or shortness of breath. 8 g 0   amLODipine (NORVASC) 10 MG tablet Take 1 tablet (10 mg total) by mouth at bedtime. 60 tablet 1   aspirin EC 81 MG tablet Take 1 tablet (81 mg total) by mouth daily. Swallow whole. 30 tablet 12   atorvastatin (LIPITOR) 40 MG tablet Take  1 tablet by mouth once daily 90 tablet 2   cetirizine (ZYRTEC) 10 MG tablet Take 1 tablet (10 mg total) by mouth daily. 90 tablet 3   Continuous Glucose Sensor (DEXCOM G7 SENSOR) MISC 3 Devices by Does not apply route as directed. Apply new sensor every 10 days 3 each 11   famotidine (PEPCID) 40 MG tablet Take 1 tablet by mouth twice daily 60 tablet 3   hydrochlorothiazide (HYDRODIURIL) 25 MG tablet Take 1 tablet (25 mg total) by mouth daily. 90 tablet 3   Insulin Glargine (BASAGLAR KWIKPEN) 100 UNIT/ML Inject 40 Units into the skin daily.     insulin lispro (HUMALOG KWIKPEN) 100 UNIT/ML KwikPen Inject 12-16 Units into the skin with breakfast, with lunch, and with evening meal.     montelukast (SINGULAIR) 10 MG tablet TAKE 1 TABLET BY MOUTH AT BEDTIME 90 tablet 0   ReliOn Lancets Thin 26G MISC 1 each by Does not apply route 3 (three) times daily as needed. 1 each 3   rOPINIRole (REQUIP) 1 MG tablet Take 1 tablet (1 mg total) by mouth at bedtime. 30 tablet 5   ULTICARE INSULIN SYRINGE 30G X 1/2" 0.3 ML MISC See admin instructions.     Blood Gluc Meter Disp-Strips (RELION ALL-IN-ONE) DEVI 1 each by Does not apply route 3 (three)  times daily as needed. (Patient not taking: Reported on 12/20/2023) 1 each 3   blood glucose meter kit and supplies KIT Dispense based on patient and insurance preference. Use up to four times daily as directed. (Patient not taking: Reported on 12/20/2023) 1 each 0   Blood Glucose Monitoring Suppl DEVI 1 each by Does not apply route in the morning, at noon, and at bedtime. May substitute to any manufacturer covered by patient's insurance. (Patient not taking: Reported on 12/20/2023) 1 each 0   ferrous sulfate 325 (65 FE) MG tablet Take 1 tablet (325 mg total) by mouth every other day. (Patient not taking: Reported on 12/20/2023) 30 tablet 3   ipratropium (ATROVENT) 0.03 % nasal spray Place 2 sprays into both nostrils every 12 (twelve) hours. (Patient not taking: Reported on 12/20/2023) 30  mL 12   traMADol (ULTRAM) 50 MG tablet Take 1 tablet (50 mg total) by mouth every 6 (six) hours as needed. (Patient not taking: Reported on 12/20/2023) 20 tablet 0   No current facility-administered medications for this visit.    REVIEW OF SYSTEMS:   [X]  denotes positive finding, [ ]  denotes negative finding Cardiac  Comments:  Chest pain or chest pressure:    Shortness of breath upon exertion:    Short of breath when lying flat:    Irregular heart rhythm:        Vascular    Pain in calf, thigh, or hip brought on by ambulation:    Pain in feet at night that wakes you up from your sleep:     Blood clot in your veins:    Leg swelling:         Pulmonary    Oxygen at home:    Productive cough:     Wheezing:         Neurologic    Sudden weakness in arms or legs:     Sudden numbness in arms or legs:     Sudden onset of difficulty speaking or slurred speech:    Temporary loss of vision in one eye:     Problems with dizziness:         Gastrointestinal    Blood in stool:     Vomited blood:         Genitourinary    Burning when urinating:     Blood in urine:        Psychiatric    Major depression:         Hematologic    Bleeding problems:    Problems with blood clotting too easily:        Skin    Rashes or ulcers:        Constitutional    Fever or chills:      PHYSICAL EXAM:   Vitals:   12/20/23 0952  BP: 126/83  Pulse: 68  Temp: 98.3 F (36.8 C)  TempSrc: Temporal  SpO2: 95%  Weight: 205 lb (93 kg)  Height: 5\' 8"  (1.727 m)    GENERAL: The patient is a well-nourished male, in no acute distress. The vital signs are documented above. CARDIAC: There is a regular rate and rhythm.  VASCULAR: Right BKA well-healed PULMONARY: Non-labored respirations NEUROLOGIC: No focal weakness or paresthesias are detected. SKIN: There are no ulcers or rashes noted. PSYCHIATRIC: The patient has a normal affect.  STUDIES:   None  MEDICAL ISSUES:   Status post right  BKA: The patient is getting his prosthesis in the immediate future.  He  is eager to get back to work.  I offered him the option of returning for follow-up to evaluate his left leg or just contacting us if he has any issues.  He will plan on calling us as needed.    Charlena Cross, MD, FACS [facility] [phone] Pager [phone]

## 2023-12-23 ENCOUNTER — Telehealth: Payer: Self-pay | Admitting: Pharmacist

## 2023-12-23 NOTE — Telephone Encounter (Signed)
 Patient contacted for follow-up of CGM and glucose control.   Since last contact patient reports overall good control however patient has had both low readings of concern < 70 and elevated readings > 250 over the last 2-3 days.  We discussed stress as a potential source for elevation.   Current Medications - no change.   Sample sensor - Dexcom G7 placed at front desk for pick-up.    Total time with patient call and documentation of interaction: 12 minutes.

## 2023-12-27 NOTE — Telephone Encounter (Signed)
 Reviewed and agree with Dr Macky Lower plan.

## 2023-12-30 ENCOUNTER — Telehealth: Payer: Self-pay | Admitting: Student

## 2023-12-30 ENCOUNTER — Telehealth: Payer: Self-pay

## 2023-12-30 ENCOUNTER — Telehealth: Payer: Self-pay | Admitting: Orthopedic Surgery

## 2023-12-30 NOTE — Telephone Encounter (Signed)
 Pt called requesting an referral to PT here in the office. Best call back number [phone]

## 2023-12-30 NOTE — Telephone Encounter (Signed)
 Referral: -pt called requesting a referral for PT to [facility]. -pt is s/p BKA.  Referral in process.  Pt notified

## 2023-12-30 NOTE — Telephone Encounter (Signed)
 Pt left VM on PT line; I called and spoke with pt that PT referral needs to come from [facility] or PCP.  Clarita Crane, PT, DPT 12/30/23 9:57 AM

## 2023-12-30 NOTE — Telephone Encounter (Signed)
 Patient calls seeking a referral for his diabetic retinopathy. He would like a referral to someone who does the eye injections and doesn't know how to go about finding an eye doctor to do that. Please let patient know when this referral has been placed and where to.

## 2023-12-31 ENCOUNTER — Other Ambulatory Visit: Payer: Self-pay | Admitting: Student

## 2023-12-31 ENCOUNTER — Telehealth: Payer: Self-pay | Admitting: Pharmacist

## 2023-12-31 DIAGNOSIS — E1151 Type 2 diabetes mellitus with diabetic peripheral angiopathy without gangrene: Secondary | ICD-10-CM

## 2023-12-31 MED ORDER — INSULIN LISPRO (1 UNIT DIAL) 100 UNIT/ML (KWIKPEN): 18.0000 [IU] | PEN_INJECTOR | Freq: Three times a day (TID) | SUBCUTANEOUS | Status: DC

## 2023-12-31 NOTE — Progress Notes (Signed)
 Pt wanting referral to ophthalmologist

## 2023-12-31 NOTE — Telephone Encounter (Signed)
 Patient calls reporting higher sugars following initial days of using new prosthesis.    Reports consistent readings 200-300 over the last several days.   Review of CGM - DEXCOM data reveals Avg Glucose 211 GMI 8.4  Called patient back and discussed readings and symptoms.  Following discussion recommended dose increase during this period of getting starting with prosthetic of: Increase Humalog from 16 to either 18 or 20 prior to meals  Discussed sliding scale.   No change today in long-acting - continue Basaglar (insulin glargine) 40 units daily.   Also agreed to supply two Dexcom G7 sensors for patient use.  Placed at front desk for pick-up.

## 2024-01-02 NOTE — Telephone Encounter (Signed)
 Called to let patient know that referral for opthomalagist had been placed and he should hear something in the next 2 weeks. Patient was to call the clinic and ask about the referral if he had not heard anything.   Patient in agreement. Patient also updated provider that he had just gotten his prosthetic and was walking on it some, and getting some PT done with it. [place] Spaniel it was  going well, with a normal amount of discomfort.   Provider was excited and encouraged patient for their progress!

## 2024-01-03 NOTE — Telephone Encounter (Signed)
 Reviewed and agree with Dr Macky Lower plan.

## 2024-01-10 DIAGNOSIS — Z89511 Acquired absence of right leg below knee: Secondary | ICD-10-CM | POA: Diagnosis not present

## 2024-01-12 ENCOUNTER — Telehealth: Payer: Self-pay | Admitting: Pharmacist

## 2024-01-12 NOTE — Telephone Encounter (Signed)
 Reviewed and agree with Dr Macky Lower plan.

## 2024-01-12 NOTE — Telephone Encounter (Signed)
 Attempted to contact patient for follow-up of Diabetes Control following review of CGM data which did not show any new data since 3/16.  Last discussion with patient was on 3/14 at which time we increased mealtime insulin.   Left HIPAA compliant voice mail requesting call back to direct phone: [phone]    Total time with patient call and documentation of interaction: 6 minutes.  Follow-up phone call planned: 10-14 days for another CGM review.

## 2024-01-20 ENCOUNTER — Other Ambulatory Visit: Payer: Self-pay | Admitting: Student

## 2024-01-20 ENCOUNTER — Telehealth: Payer: Self-pay | Admitting: Pharmacist

## 2024-01-20 DIAGNOSIS — E1151 Type 2 diabetes mellitus with diabetic peripheral angiopathy without gangrene: Secondary | ICD-10-CM

## 2024-01-20 MED ORDER — BASAGLAR KWIKPEN 100 UNIT/ML ~~LOC~~ SOPN: 40.0000 [IU] | PEN_INJECTOR | Freq: Every day | SUBCUTANEOUS | 2 refills | Status: DC

## 2024-01-20 MED ORDER — INSULIN LISPRO (1 UNIT DIAL) 100 UNIT/ML (KWIKPEN)
18.0000 [IU] | PEN_INJECTOR | Freq: Three times a day (TID) | SUBCUTANEOUS | 8 refills | Status: DC
Start: 2024-01-20 — End: 2024-01-25

## 2024-01-20 NOTE — Telephone Encounter (Signed)
 Patient contacts office and reports that is glucose has been variable in control.  He also requested additional CGM sensors.  Returned call and since last contact patient reports that he has noticed more variable readings with several lows.  Reviewed his CGM readings and patient agreed that he needs to be more aware of readings and not have extended periods of fasting glucose.   GMI: 8.3    He also noted that he needs an adjustment to his insulin doses for his Lilly Cares MAP supply.  I shared that I would communicate this change with Siri Cole, CPhT for adjustment.   Current Medications include:  Basaglar (insulin glargine) 40 units once daily Humalog (insulin lispro)  20 units prior to meals 3x daily.  Patient denies any significant medication related side effects.  Medication Plan: - Continue insulin therapy at same doses  - Provided Dexcom G7 sensors for continued use during PT and transition to using his prosethesis.   Sensors #3 placed at front desk for pick-up later today.    Total time with patient call and documentation of interaction: 22 minutes.  F/U Phone call planned: 1 month

## 2024-01-20 NOTE — Telephone Encounter (Signed)
 Sent Rx of both meds to [facility]

## 2024-01-25 ENCOUNTER — Telehealth: Payer: Self-pay | Admitting: Pharmacist

## 2024-01-25 DIAGNOSIS — E1151 Type 2 diabetes mellitus with diabetic peripheral angiopathy without gangrene: Secondary | ICD-10-CM

## 2024-01-25 MED ORDER — BASAGLAR KWIKPEN 100 UNIT/ML ~~LOC~~ SOPN: 40.0000 [IU] | PEN_INJECTOR | Freq: Every day | SUBCUTANEOUS | 3 refills | Status: DC

## 2024-01-25 MED ORDER — INSULIN LISPRO (1 UNIT DIAL) 100 UNIT/ML (KWIKPEN): 20.0000 [IU] | PEN_INJECTOR | Freq: Three times a day (TID) | SUBCUTANEOUS | 3 refills | Status: DC

## 2024-01-25 NOTE — Telephone Encounter (Signed)
 Patient contacts office to request assistance with his indigent care supply of Basaglar and Humalog.  He states that he needs new prescriptions with updated doses sent to pharmacy - [facility].   Since last contact patient reports glucose readings have been doing well.   CGM review - GMI is 8.3 He did notice that when he is in more pain with his prosthesis that his glucose readings are impacted negatively.   Medication Plan: -Continue  Humalog (insulin lispro) 20 units TID prior to meals Basaglar (insulin glargine) 40 units once daily.   New prescriptions provided Total time with patient call and documentation of interaction: 13 minutes.

## 2024-01-26 NOTE — Telephone Encounter (Signed)
 Reviewed and agree with Dr Macky Lower plan.

## 2024-02-07 ENCOUNTER — Telehealth: Payer: Self-pay | Admitting: Pharmacist

## 2024-02-07 NOTE — Telephone Encounter (Signed)
 Reviewed and agree with Dr Macky Lower plan.

## 2024-02-07 NOTE — Telephone Encounter (Signed)
 Request for CGM supplies and assistance for glucose control.   Patient contacted for follow-up and note that CGM data is not in sharing mode.     Since last contact patient reports elevated readings with high variability.  Patient expressed frustration with variability and number of high readings.   Current Medications include: insulin  glargine 40 units daily Humalgo 20 units TID.  Patient denies any significant medication related side effects. Denies any low readings.   Medication Plan: - no change today.  - - 3 sample sensors supplied for pick-up.   Follow-up in 1-2 weeks.    Total time with patient call and documentation of interaction: 11 minutes.

## 2024-02-09 ENCOUNTER — Telehealth: Payer: Self-pay | Admitting: Pharmacist

## 2024-02-09 NOTE — Telephone Encounter (Signed)
 Patient contacted for follow-up of CGM - data sharing issue with Dexcom Clarity portal.   Provided Clinic Code for connectivity.  [email or web address]  Asked patient to input code which should correct data sharing access with online portal/[facility].   Total time with patient call and documentation of interaction: 8 minutes.

## 2024-02-18 ENCOUNTER — Other Ambulatory Visit: Payer: Self-pay | Admitting: Student

## 2024-02-18 ENCOUNTER — Encounter: Payer: Self-pay | Admitting: Student

## 2024-02-18 DIAGNOSIS — I152 Hypertension secondary to endocrine disorders: Secondary | ICD-10-CM

## 2024-02-18 DIAGNOSIS — R198 Other specified symptoms and signs involving the digestive system and abdomen: Secondary | ICD-10-CM

## 2024-02-18 NOTE — Progress Notes (Signed)
 Pt needing BMP to see kidney function for consideration of starting ibuprofen  for pain control. Last bmp a year prior.

## 2024-02-23 ENCOUNTER — Other Ambulatory Visit

## 2024-03-14 ENCOUNTER — Telehealth: Payer: Self-pay | Admitting: Pharmacist

## 2024-03-14 NOTE — Telephone Encounter (Unsigned)
 Patient contacts office to requests assistance with medication access / sample of Dexcom Sensor stating supply would be $$$.   CGM review showed GMI of 7.9  Contacted pharmacy and they shared that the prescription was $0 and there a supply awaiting his pick-up currently.   Contacted patient and shared news of $0 copay and availability of sensors.  He will call to verify presence of sensors due to limited supply issue reported to him earlier today by the pharmacy.   I asked patient to call me back if he still needed sensor for short-term if supply issue exists.   Total time with patient call and documentation of interaction: 22 minutes.

## 2024-03-15 NOTE — Telephone Encounter (Signed)
 Reviewed and agree with Dr Macky Lower plan.

## 2024-03-21 ENCOUNTER — Other Ambulatory Visit: Payer: Self-pay

## 2024-03-21 DIAGNOSIS — R0982 Postnasal drip: Secondary | ICD-10-CM

## 2024-03-21 MED ORDER — MONTELUKAST SODIUM 10 MG PO TABS: 10.0000 mg | ORAL_TABLET | Freq: Every day | ORAL | 1 refills | Status: DC

## 2024-03-25 ENCOUNTER — Other Ambulatory Visit: Payer: Self-pay | Admitting: Student

## 2024-03-28 ENCOUNTER — Encounter: Payer: Self-pay | Admitting: *Deleted

## 2024-04-14 ENCOUNTER — Telehealth: Payer: Self-pay | Admitting: Pharmacist

## 2024-04-14 NOTE — Telephone Encounter (Signed)
 Patient contacted for follow-up of request for call-back related to low CGM readings.  Since last contact patient reports that his CGM readings were very low yesterday and is concerned.   Review of his CGM readings show: 2 weeks Average glucose of 186 GMI 7.8 TIR 46% wit 41% in the 180-250 range All lows appear to be yesterday - reading at the time of my return call was 154  Patient reports placing new sensor just yesterday.  We discussed slow warm-up of sensor vs. Faulty sensor.   We agreed to check finger sticks if readings did not match symptoms - he states he has glucometer and supplies.   Plan is to allow new sensor to function for 12-24 hours and if readings stabilize, continue use of this sensor.  However, if more erroneous readings that are inconsistent with finger sticks by > 50 points routinely, patient was instructed to initiate a new sensor - patient has one on-hand.   Encouraged to call back if inconsistent readings persist.   Total time with patient call and documentation of interaction: 16 minutes.  F/U Phone call planned: None

## 2024-04-16 ENCOUNTER — Other Ambulatory Visit: Payer: Self-pay | Admitting: Student

## 2024-04-16 DIAGNOSIS — E1159 Type 2 diabetes mellitus with other circulatory complications: Secondary | ICD-10-CM

## 2024-04-17 NOTE — Telephone Encounter (Signed)
 Reviewed and agree with Dr Macky Lower plan.

## 2024-05-03 ENCOUNTER — Ambulatory Visit: Payer: Self-pay | Admitting: Student

## 2024-05-03 ENCOUNTER — Ambulatory Visit (INDEPENDENT_AMBULATORY_CARE_PROVIDER_SITE_OTHER): Payer: Self-pay | Admitting: Student

## 2024-05-03 VITALS — BP 146/84 | HR 90 | Ht 68.0 in | Wt 232.0 lb

## 2024-05-03 DIAGNOSIS — R7989 Other specified abnormal findings of blood chemistry: Secondary | ICD-10-CM

## 2024-05-03 DIAGNOSIS — Z0000 Encounter for general adult medical examination without abnormal findings: Secondary | ICD-10-CM

## 2024-05-03 DIAGNOSIS — E1159 Type 2 diabetes mellitus with other circulatory complications: Secondary | ICD-10-CM

## 2024-05-03 DIAGNOSIS — E1151 Type 2 diabetes mellitus with diabetic peripheral angiopathy without gangrene: Secondary | ICD-10-CM

## 2024-05-03 DIAGNOSIS — I152 Hypertension secondary to endocrine disorders: Secondary | ICD-10-CM

## 2024-05-03 DIAGNOSIS — E782 Mixed hyperlipidemia: Secondary | ICD-10-CM

## 2024-05-03 DIAGNOSIS — Z89511 Acquired absence of right leg below knee: Secondary | ICD-10-CM

## 2024-05-03 DIAGNOSIS — Z794 Long term (current) use of insulin: Secondary | ICD-10-CM

## 2024-05-03 LAB — POCT GLYCOSYLATED HEMOGLOBIN (HGB A1C): HbA1c, POC (controlled diabetic range): 7.6 % — AB (ref 0.0–7.0)

## 2024-05-03 NOTE — Patient Instructions (Addendum)
 It was great to see you! Thank you for allowing me to participate in your care!  I recommend that you always bring your medications to each appointment as this makes it easy to ensure we are on the correct medications and helps us  not miss when refills are needed.  Our plans for today:  - Dentist  Cosette Hai DDS  [facility]  Address: [address], [place], [place] [address] Phone: [phone]  - Sport's Medicine  [facility]  Address: [address] [place], [place], [place] [address] Phone: [phone]  [facility]  Address: [address] [place], [place], [place] [address] Phone: [phone]  - Check Up  Checking A1c, Lipid panel, Liver function, and anemia   Diabetes Your A1c looks good, it's just a hair high. We will have you follow up with Dr. Koval  We are checking some labs today, I will call you if they are abnormal will send you a MyChart message or a letter if they are normal.  If you do not hear about your labs in the next 2 weeks please let us  know.  Take care and seek immediate care sooner if you develop any concerns.   Dr. Penne Rhein, MD [facility]

## 2024-05-03 NOTE — Assessment & Plan Note (Addendum)
 Checking lipid panel today.  Patient reports good compliance with medication. - Lipid panel - Continue atorvastatin  40 mg daily

## 2024-05-03 NOTE — Assessment & Plan Note (Addendum)
 Patient comes in for follow-up of elevated LFTs.  Patient noted to have elevated LFTs for unsure reason.  Will recheck today.  Otherwise feeling fine asymptomatic.  Patient also noted to have elevated alk phos, we will recheck. - CMP - GGT - Vitamin D - Alk phos

## 2024-05-03 NOTE — Assessment & Plan Note (Addendum)
 Patient comes in for follow-up of BKA.  Patient reports doing well, good experience with prosthesis.  Patient reports he is able to walk, without pain, and work.  Patient is returned to work, reports having been able to walk 300 m plus with no issue.  Patient reports he walks better with prosthesis that he did while having toe amputations.  Will continue to follow, however patient doing well. - Follow-up as needed

## 2024-05-03 NOTE — Assessment & Plan Note (Addendum)
 Patient comes in for follow-up of diabetes.  Patient reports good compliance with medications, noting fasting sugars ranging from 130-180.  Patient A1c 7.6 today, nearly at goal.  Will have patient to follow-up with Dr. Koval for medication management.  Consider SGLT2, and adjusting short acting insulin , will hold off for now given risk of hypoglycemia. - Follow-up - Glargine 40 units daily - Humalog  20 units 3 times daily with meals - Consider SGLT2 in future

## 2024-05-03 NOTE — Progress Notes (Signed)
 SUBJECTIVE:   CHIEF COMPLAINT / HPI:   DM2 Meds: insulin  glargine 40 units daily Humalgo 20 units TID Today taking meds as prescribed. Sugars usually ranging between 130-180. Sometimes high after meals around 200. Has cut back on sugar in diet. Had one episode of hypoglycemia to the 40's that was difficult to raise to normal level.   BKA 09/02/23 No longer has pain, able to walk 300 m w/o issue, feels he walks better with prosthetic than he did with toe amputation. Able to work without complaint/issue and has restarted back at work.   HTN Meds: Amlodipine  10, hydrochlorothiazide  25 Taking both daily  HLD Meds: Atorvastatin  40 Taking daily  Check up Coming for check up on liver enzymes and anemia, eating doing well, normal state of health.  PERTINENT  PMH / PSH:   OBJECTIVE:  BP (!) 146/84   Pulse 90   Ht 5' 8 (1.727 m)   Wt 232 lb (105.2 kg)   SpO2 97%   BMI 35.28 kg/m  Physical Exam Constitutional:      General: He is not in acute distress.    Appearance: Normal appearance. He is not ill-appearing.  Cardiovascular:     Rate and Rhythm: Normal rate and regular rhythm.     Pulses: Normal pulses.     Heart sounds: Normal heart sounds. No murmur heard.    No friction rub. No gallop.  Pulmonary:     Effort: Pulmonary effort is normal. No respiratory distress.     Breath sounds: Normal breath sounds. No stridor. No wheezing, rhonchi or rales.  Neurological:     Mental Status: He is alert.  Psychiatric:        Mood and Affect: Mood normal.        Behavior: Behavior normal.        Thought Content: Thought content normal.      ASSESSMENT/PLAN:   Assessment & Plan Hypertension associated with diabetes [facility]) Patient comes in for follow-up of blood pressure.  Patient reports compliance with medication.  Blood pressure slightly elevated today, will recheck at next visit.  Will check CMP for electrolytes, and renal function.  If elevated at next visit we will  consider adjusting meds. - Continue HCTZ 25 mg daily - Continue amlodipine  10 mg daily - CMP - Recheck BP at next visit Mixed hyperlipidemia Checking lipid panel today.  Patient reports good compliance with medication. - Lipid panel - Continue atorvastatin  40 mg daily Elevated LFTs Patient comes in for follow-up of elevated LFTs.  Patient noted to have elevated LFTs for unsure reason.  Will recheck today.  Otherwise feeling fine asymptomatic.  Patient also noted to have elevated alk phos, we will recheck. - CMP - GGT - Vitamin D - Alk phos Annual physical exam Patient comes in for checkup.  Patient noted to have anemia, for unknown reason.  Will reevaluate.  Will also check liver function. - CBC -Liver enzymes/labs S/P BKA (below knee amputation) unilateral, right [facility]) Patient comes in for follow-up of BKA.  Patient reports doing well, good experience with prosthesis.  Patient reports he is able to walk, without pain, and work.  Patient is returned to work, reports having been able to walk 300 m plus with no issue.  Patient reports he walks better with prosthesis that he did while having toe amputations.  Will continue to follow, however patient doing well. - Follow-up as needed Type 2 diabetes mellitus with other circulatory complication, with long-term current use of insulin  ([name])  Patient comes in for follow-up of diabetes.  Patient reports good compliance with medications, noting fasting sugars ranging from 130-180.  Patient A1c 7.6 today, nearly at goal.  Will have patient to follow-up with Dr. Koval for medication management.  Consider SGLT2, and adjusting short acting insulin , will hold off for now given risk of hypoglycemia. - Follow-up - Glargine 40 units daily - Humalog  20 units 3 times daily with meals - Consider SGLT2 in future No follow-ups on file. Penne Rhein, MD 05/03/2024, 10:06 AM PGY-3, [facility]

## 2024-05-03 NOTE — Assessment & Plan Note (Addendum)
 Patient comes in for follow-up of blood pressure.  Patient reports compliance with medication.  Blood pressure slightly elevated today, will recheck at next visit.  Will check CMP for electrolytes, and renal function.  If elevated at next visit we will consider adjusting meds. - Continue HCTZ 25 mg daily - Continue amlodipine  10 mg daily - CMP - Recheck BP at next visit

## 2024-05-04 ENCOUNTER — Telehealth: Payer: Self-pay | Admitting: Pharmacist

## 2024-05-04 LAB — CBC WITH DIFFERENTIAL/PLATELET
Basophils Absolute: 0 x10E3/uL (ref 0.0–0.2)
Basos: 1 %
EOS (ABSOLUTE): 0.2 x10E3/uL (ref 0.0–0.4)
Eos: 3 %
Hematocrit: 45.7 % (ref 37.5–51.0)
Hemoglobin: 15 g/dL (ref 13.0–17.7)
Immature Grans (Abs): 0 x10E3/uL (ref 0.0–0.1)
Immature Granulocytes: 0 %
Lymphocytes Absolute: 2.3 x10E3/uL (ref 0.7–3.1)
Lymphs: 28 %
MCH: 28.3 pg (ref 26.6–33.0)
MCHC: 32.8 g/dL (ref 31.5–35.7)
MCV: 86 fL (ref 79–97)
Monocytes Absolute: 0.7 x10E3/uL (ref 0.1–0.9)
Monocytes: 9 %
Neutrophils Absolute: 4.9 x10E3/uL (ref 1.4–7.0)
Neutrophils: 59 %
Platelets: 327 x10E3/uL (ref 150–450)
RBC: 5.3 x10E6/uL (ref 4.14–5.80)
RDW: 13.3 % (ref 11.6–15.4)
WBC: 8.2 x10E3/uL (ref 3.4–10.8)

## 2024-05-04 LAB — COMPREHENSIVE METABOLIC PANEL WITH GFR
ALT: 35 IU/L (ref 0–44)
AST: 30 IU/L (ref 0–40)
Albumin: 4.8 g/dL (ref 3.8–4.9)
Alkaline Phosphatase: 107 IU/L (ref 44–121)
BUN/Creatinine Ratio: 21 — ABNORMAL HIGH (ref 9–20)
BUN: 21 mg/dL (ref 6–24)
Bilirubin Total: 0.5 mg/dL (ref 0.0–1.2)
CO2: 24 mmol/L (ref 20–29)
Calcium: 9.9 mg/dL (ref 8.7–10.2)
Chloride: 98 mmol/L (ref 96–106)
Creatinine, Ser: 0.98 mg/dL (ref 0.76–1.27)
Globulin, Total: 2.6 g/dL (ref 1.5–4.5)
Glucose: 132 mg/dL — ABNORMAL HIGH (ref 70–99)
Potassium: 5.2 mmol/L (ref 3.5–5.2)
Sodium: 138 mmol/L (ref 134–144)
Total Protein: 7.4 g/dL (ref 6.0–8.5)
eGFR: 92 mL/min/1.73 (ref >59)

## 2024-05-04 LAB — LIPID PANEL
Chol/HDL Ratio: 4.4 ratio (ref 0.0–5.0)
Cholesterol, Total: 186 mg/dL (ref 100–199)
HDL: 42 mg/dL (ref >39)
LDL Chol Calc (NIH): 105 mg/dL — ABNORMAL HIGH (ref 0–99)
Triglycerides: 229 mg/dL — ABNORMAL HIGH (ref 0–149)
VLDL Cholesterol Cal: 39 mg/dL (ref 5–40)

## 2024-05-04 NOTE — Telephone Encounter (Signed)
 Patient contacts office to request assistance with CGM (continuous glucose monitor) supply.  Review of A1C shows 7.6 yesterday and GMI of 7.5 over the last two weeks.  Congratulated patient on greatly improved control of glucose.   2 Dexcom G7 sensors labeled and placed at front desk for pick-up.   Patient thanked me for the support while his insurance situation is resolved.   Total time with patient call and documentation of interaction: 12 minutes.

## 2024-05-05 NOTE — Telephone Encounter (Signed)
 Reviewed and agree with Dr Macky Lower plan.

## 2024-05-12 ENCOUNTER — Encounter: Payer: Self-pay | Admitting: Student

## 2024-05-16 ENCOUNTER — Other Ambulatory Visit: Payer: Self-pay | Admitting: Student

## 2024-05-16 DIAGNOSIS — R198 Other specified symptoms and signs involving the digestive system and abdomen: Secondary | ICD-10-CM

## 2024-06-20 ENCOUNTER — Other Ambulatory Visit: Payer: Self-pay

## 2024-06-20 DIAGNOSIS — R198 Other specified symptoms and signs involving the digestive system and abdomen: Secondary | ICD-10-CM

## 2024-06-20 MED ORDER — FAMOTIDINE 40 MG PO TABS: 40.0000 mg | ORAL_TABLET | Freq: Two times a day (BID) | ORAL | 2 refills | Status: DC

## 2024-06-30 ENCOUNTER — Other Ambulatory Visit (HOSPITAL_COMMUNITY): Payer: Self-pay

## 2024-08-12 ENCOUNTER — Encounter: Payer: Self-pay | Admitting: Vascular Surgery

## 2024-08-14 ENCOUNTER — Other Ambulatory Visit: Payer: Self-pay | Admitting: Surgery

## 2024-08-14 ENCOUNTER — Telehealth: Payer: Self-pay

## 2024-08-14 DIAGNOSIS — L97529 Non-pressure chronic ulcer of other part of left foot with unspecified severity: Secondary | ICD-10-CM

## 2024-08-14 NOTE — Telephone Encounter (Signed)
 Patient called reporting new ulcer to left foot that is hurting and swelling starting 08/08/24.  Appts made for L ABI and appt with PA.  Pt. aware.

## 2024-08-16 ENCOUNTER — Ambulatory Visit (HOSPITAL_COMMUNITY): Admission: RE | Admit: 2024-08-16

## 2024-08-16 NOTE — Progress Notes (Deleted)
 Office Note     CC:  follow up Requesting Provider:  Toma Matas, MD  HPI: Sean Carter is a 53 y.o. (September 18, 1971) male who presents as a triage for a new left foot wound. He says he fell at work a couple weeks ago and the wound just started hurting on 08/08/24. He called to report this to our office. He has not had issues with the LLE in the past. He does have a right below-knee amputation on 09/02/2023 that is well healed. He is ambulating with a prosthesis.   Past Medical History:  Diagnosis Date   Blurry vision 07/10/2020   Diabetes mellitus without complication ([name])    Gangrene of toe of left foot ([name])    Peripheral arterial disease    Sepsis due to Streptococcus, group B ([name]) 06/20/2020   Streptococcal bacteremia     Past Surgical History:  Procedure Laterality Date   ABDOMINAL AORTOGRAM W/LOWER EXTREMITY N/A 01/18/2023   Procedure: ABDOMINAL AORTOGRAM W/LOWER EXTREMITY;  Surgeon: Eliza Lonni RAMAN, MD;  Location: [facility] INVASIVE CV LAB;  Service: Cardiovascular;  Laterality: N/A;   AMPUTATION Left 05/31/2020   Procedure: Ray Amputation of Left Great Toe with Negative Pressure Vac Placement;  Surgeon: Gretta Lonni PARAS, MD;  Location: [facility] OR;  Service: Vascular;  Laterality: Left;   AMPUTATION Right 07/22/2022   Procedure: RIGHT FIRST AND SECOND RAY PARTIAL  AMPUTATION;  Surgeon: Magda Debby SAILOR, MD;  Location: [facility] OR;  Service: Vascular;  Laterality: Right;   AMPUTATION Right 09/02/2023   Procedure: AMPUTATION BELOW KNEE;  Surgeon: Serene Gaile ORN, MD;  Location: [facility] OR;  Service: Vascular;  Laterality: Right;   APPLICATION OF WOUND VAC Right 01/22/2023   Procedure: APPLICATION OF WOUND VAC, RIGHT FOOT;  Surgeon: Sheree Penne Lonni, MD;  Location: [facility] OR;  Service: Vascular;  Laterality: Right;   INCISION AND DRAINAGE OF WOUND Right 01/22/2023   Procedure: WASHOUT OF RIGHT FOOT WOUND;  Surgeon: Sheree Penne Lonni, MD;  Location: [facility] OR;  Service: Vascular;   Laterality: Right;   PERIPHERAL VASCULAR BALLOON ANGIOPLASTY  01/18/2023   Procedure: PERIPHERAL VASCULAR BALLOON ANGIOPLASTY;  Surgeon: Eliza Lonni RAMAN, MD;  Location: [facility] INVASIVE CV LAB;  Service: Cardiovascular;;   STUMP REVISION Right 01/22/2023   Procedure: PARTIAL CLOSURE OF RIGHT FOOT WOUND;  Surgeon: Sheree Penne Lonni, MD;  Location: [facility] OR;  Service: Vascular;  Laterality: Right;   TRANSMETATARSAL AMPUTATION Right 01/19/2023   Procedure: TRANSMETATARSAL AMPUTATION;  Surgeon: Sheree Penne Lonni, MD;  Location: [facility] OR;  Service: Vascular;  Laterality: Right;    Social History   Socioeconomic History   Marital status: Married    Spouse name: Not on file   Number of children: Not on file   Years of education: Not on file   Highest education level: 12th grade  Occupational History   Not on file  Tobacco Use   Smoking status: Never    Passive exposure: Never   Smokeless tobacco: Never  Vaping Use   Vaping status: Never Used  Substance and Sexual Activity   Alcohol  use: Not on file   Drug use: Never   Sexual activity: Not on file  Other Topics Concern   Not on file  Social History Narrative   Not on file   Social Drivers of Health   Financial Resource Strain: High Risk (05/02/2024)   Overall Financial Resource Strain (CARDIA)    Difficulty of Paying Living Expenses: Hard  Food Insecurity: No Food Insecurity (05/02/2024)  Hunger Vital Sign    Worried About Running Out of Food in the Last Year: Never true    Ran Out of Food in the Last Year: Never true  Transportation Needs: Unmet Transportation Needs (05/02/2024)   PRAPARE - Transportation    Lack of Transportation (Medical): Yes    Lack of Transportation (Non-Medical): Yes  Physical Activity: Insufficiently Active (05/02/2024)   Exercise Vital Sign    Days of Exercise per Week: 2 days    Minutes of Exercise per Session: 30 min  Stress: No Stress Concern Present (05/02/2024)   [employer] of  Occupational Health - Occupational Stress Questionnaire    Feeling of Stress: Only a little  Social Connections: Moderately Integrated (05/02/2024)   Social Connection and Isolation Panel    Frequency of Communication with Friends and Family: More than three times a week    Frequency of Social Gatherings with Friends and Family: Once a week    Attends Religious Services: More than 4 times per year    Active Member of [employer] or Organizations: No    Attends Banker Meetings: Not on file    Marital Status: Living with partner  Intimate Partner Violence: Not At Risk (09/01/2023)   Humiliation, Afraid, Rape, and Kick questionnaire    Fear of Current or Ex-Partner: No    Emotionally Abused: No    Physically Abused: No    Sexually Abused: No   ***No family history on file.  Current Outpatient Medications  Medication Sig Dispense Refill   acetaminophen  (TYLENOL ) 325 MG tablet Take 2 tablets (650 mg total) by mouth every 6 (six) hours as needed for headache or mild pain. 90 tablet 2   albuterol  (VENTOLIN  HFA) 108 (90 Base) MCG/ACT inhaler Inhale 2 puffs into the lungs every 6 (six) hours as needed for wheezing or shortness of breath. 8 g 0   amLODipine  (NORVASC ) 10 MG tablet TAKE 1 TABLET BY MOUTH AT BEDTIME 60 tablet 2   aspirin  EC 81 MG tablet Take 1 tablet (81 mg total) by mouth daily. Swallow whole. 30 tablet 12   atorvastatin  (LIPITOR) 40 MG tablet Take 1 tablet by mouth once daily 90 tablet 2   Blood Gluc Meter Disp-Strips (RELION ALL-IN-ONE) DEVI 1 each by Does not apply route 3 (three) times daily as needed. (Patient not taking: Reported on 12/20/2023) 1 each 3   blood glucose meter kit and supplies KIT Dispense based on patient and insurance preference. Use up to four times daily as directed. (Patient not taking: Reported on 12/20/2023) 1 each 0   Blood Glucose Monitoring Suppl DEVI 1 each by Does not apply route in the morning, at noon, and at bedtime. May substitute to any  manufacturer covered by patient's insurance. (Patient not taking: Reported on 12/20/2023) 1 each 0   cetirizine  (ZYRTEC ) 10 MG tablet Take 1 tablet by mouth once daily 30 tablet 10   Continuous Glucose Sensor (DEXCOM G7 SENSOR) MISC 3 Devices by Does not apply route as directed. Apply new sensor every 10 days 3 each 11   famotidine  (PEPCID ) 40 MG tablet Take 1 tablet (40 mg total) by mouth 2 (two) times daily. 60 tablet 2   ferrous sulfate  325 (65 FE) MG tablet Take 1 tablet (325 mg total) by mouth every other day. (Patient not taking: Reported on 12/20/2023) 30 tablet 3   hydrochlorothiazide  (HYDRODIURIL ) 25 MG tablet Take 1 tablet (25 mg total) by mouth daily. 90 tablet 3   Insulin  Glargine (BASAGLAR   KWIKPEN) 100 UNIT/ML Inject 40 Units into the skin daily. 36 mL 3   insulin  lispro (HUMALOG  KWIKPEN) 100 UNIT/ML KwikPen Inject 20 Units into the skin with breakfast, with lunch, and with evening meal. 54 mL 3   ipratropium (ATROVENT ) 0.03 % nasal spray Place 2 sprays into both nostrils every 12 (twelve) hours. (Patient not taking: Reported on 12/20/2023) 30 mL 12   montelukast  (SINGULAIR ) 10 MG tablet Take 1 tablet (10 mg total) by mouth at bedtime. 90 tablet 1   ReliOn Lancets Thin 26G MISC 1 each by Does not apply route 3 (three) times daily as needed. 1 each 3   rOPINIRole  (REQUIP ) 1 MG tablet TAKE 1 TABLET BY MOUTH AT BEDTIME 30 tablet 2   traMADol  (ULTRAM ) 50 MG tablet Take 1 tablet (50 mg total) by mouth every 6 (six) hours as needed. (Patient not taking: Reported on 12/20/2023) 20 tablet 0   ULTICARE INSULIN  SYRINGE 30G X 1/2 0.3 ML MISC See admin instructions.     No current facility-administered medications for this visit.    Allergies  Allergen Reactions   Scallops [Shellfish Allergy] Anaphylaxis   Penicillins Other (See Comments)    Unknown reaction as a child     REVIEW OF SYSTEMS:  Negative unless noted in HPI [X]  denotes positive finding, [ ]  denotes negative finding Cardiac   Comments:  Chest pain or chest pressure:    Shortness of breath upon exertion:    Short of breath when lying flat:    Irregular heart rhythm:        Vascular    Pain in calf, thigh, or hip brought on by ambulation:    Pain in feet at night that wakes you up from your sleep:     Blood clot in your veins:    Leg swelling:         Pulmonary    Oxygen at home:    Productive cough:     Wheezing:         Neurologic    Sudden weakness in arms or legs:     Sudden numbness in arms or legs:     Sudden onset of difficulty speaking or slurred speech:    Temporary loss of vision in one eye:     Problems with dizziness:         Gastrointestinal    Blood in stool:     Vomited blood:         Genitourinary    Burning when urinating:     Blood in urine:        Psychiatric    Major depression:         Hematologic    Bleeding problems:    Problems with blood clotting too easily:        Skin    Rashes or ulcers:        Constitutional    Fever or chills:      PHYSICAL EXAMINATION:  There were no vitals filed for this visit.***  General:  WDWN in NAD; vital signs documented above Gait: Not observed HENT: WNL, normocephalic Pulmonary: normal non-labored breathing Cardiac: {Desc; regular/irreg:14544} HR Abdomen: soft, NT, no masses Skin: {With/Without:20273} rashes Vascular Exam/Pulses: *** Extremities: {With/Without:20273} ischemic changes, {With/Without:20273} Gangrene , {With/Without:20273} cellulitis; {With/Without:20273} open wounds;  Musculoskeletal: no muscle wasting or atrophy  Neurologic: A&O X 3 Psychiatric:  The pt has Normal affect.   Non-Invasive Vascular Imaging:   ***    ASSESSMENT/PLAN:: 53 y.o. male here  for follow up for ***   -***   Teretha Damme, PA-C [facility] [phone]  Clinic MD:   [place]

## 2024-08-17 ENCOUNTER — Ambulatory Visit: Attending: Family Medicine

## 2024-08-21 ENCOUNTER — Emergency Department (HOSPITAL_COMMUNITY)

## 2024-08-21 ENCOUNTER — Inpatient Hospital Stay (HOSPITAL_COMMUNITY)
Admission: EM | Admit: 2024-08-21 | Discharge: 2024-08-24 | DRG: 854 | Disposition: A | Discharge status: Rehabilitation Facility | Attending: Family Medicine | Admitting: Family Medicine

## 2024-08-21 ENCOUNTER — Other Ambulatory Visit: Payer: Self-pay

## 2024-08-21 ENCOUNTER — Telehealth: Payer: Self-pay | Admitting: Surgery

## 2024-08-21 ENCOUNTER — Encounter: Payer: Self-pay | Admitting: Radiology

## 2024-08-21 ENCOUNTER — Encounter (HOSPITAL_COMMUNITY): Payer: Self-pay

## 2024-08-21 DIAGNOSIS — I96 Gangrene, not elsewhere classified: Secondary | ICD-10-CM | POA: Diagnosis not present

## 2024-08-21 DIAGNOSIS — M19072 Primary osteoarthritis, left ankle and foot: Secondary | ICD-10-CM | POA: Diagnosis not present

## 2024-08-21 DIAGNOSIS — Z789 Other specified health status: Secondary | ICD-10-CM

## 2024-08-21 DIAGNOSIS — M7989 Other specified soft tissue disorders: Secondary | ICD-10-CM | POA: Diagnosis not present

## 2024-08-21 DIAGNOSIS — F54 Psychological and behavioral factors associated with disorders or diseases classified elsewhere: Secondary | ICD-10-CM | POA: Diagnosis not present

## 2024-08-21 DIAGNOSIS — N179 Acute kidney failure, unspecified: Secondary | ICD-10-CM | POA: Diagnosis present

## 2024-08-21 DIAGNOSIS — I10 Essential (primary) hypertension: Secondary | ICD-10-CM | POA: Diagnosis not present

## 2024-08-21 DIAGNOSIS — Z5982 Transportation insecurity: Secondary | ICD-10-CM

## 2024-08-21 DIAGNOSIS — E1140 Type 2 diabetes mellitus with diabetic neuropathy, unspecified: Secondary | ICD-10-CM | POA: Diagnosis not present

## 2024-08-21 DIAGNOSIS — Z91013 Allergy to seafood: Secondary | ICD-10-CM | POA: Diagnosis not present

## 2024-08-21 DIAGNOSIS — I70262 Atherosclerosis of native arteries of extremities with gangrene, left leg: Secondary | ICD-10-CM | POA: Diagnosis not present

## 2024-08-21 DIAGNOSIS — H538 Other visual disturbances: Secondary | ICD-10-CM | POA: Diagnosis not present

## 2024-08-21 DIAGNOSIS — E785 Hyperlipidemia, unspecified: Secondary | ICD-10-CM | POA: Diagnosis present

## 2024-08-21 DIAGNOSIS — E11319 Type 2 diabetes mellitus with unspecified diabetic retinopathy without macular edema: Secondary | ICD-10-CM | POA: Diagnosis present

## 2024-08-21 DIAGNOSIS — E860 Dehydration: Secondary | ICD-10-CM | POA: Diagnosis not present

## 2024-08-21 DIAGNOSIS — Z833 Family history of diabetes mellitus: Secondary | ICD-10-CM | POA: Diagnosis not present

## 2024-08-21 DIAGNOSIS — Z89511 Acquired absence of right leg below knee: Secondary | ICD-10-CM | POA: Diagnosis not present

## 2024-08-21 DIAGNOSIS — R7989 Other specified abnormal findings of blood chemistry: Secondary | ICD-10-CM | POA: Diagnosis present

## 2024-08-21 DIAGNOSIS — S88112S Complete traumatic amputation at level between knee and ankle, left lower leg, sequela: Secondary | ICD-10-CM | POA: Diagnosis not present

## 2024-08-21 DIAGNOSIS — B955 Unspecified streptococcus as the cause of diseases classified elsewhere: Secondary | ICD-10-CM | POA: Diagnosis not present

## 2024-08-21 DIAGNOSIS — D72829 Elevated white blood cell count, unspecified: Secondary | ICD-10-CM | POA: Diagnosis not present

## 2024-08-21 DIAGNOSIS — A419 Sepsis, unspecified organism: Secondary | ICD-10-CM | POA: Diagnosis not present

## 2024-08-21 DIAGNOSIS — S88112A Complete traumatic amputation at level between knee and ankle, left lower leg, initial encounter: Secondary | ICD-10-CM | POA: Diagnosis not present

## 2024-08-21 DIAGNOSIS — M869 Osteomyelitis, unspecified: Secondary | ICD-10-CM | POA: Diagnosis not present

## 2024-08-21 DIAGNOSIS — M549 Dorsalgia, unspecified: Secondary | ICD-10-CM | POA: Diagnosis not present

## 2024-08-21 DIAGNOSIS — E1169 Type 2 diabetes mellitus with other specified complication: Secondary | ICD-10-CM | POA: Diagnosis not present

## 2024-08-21 DIAGNOSIS — Z89412 Acquired absence of left great toe: Secondary | ICD-10-CM

## 2024-08-21 DIAGNOSIS — Z59868 Other specified financial insecurity: Secondary | ICD-10-CM

## 2024-08-21 DIAGNOSIS — I70202 Unspecified atherosclerosis of native arteries of extremities, left leg: Secondary | ICD-10-CM | POA: Diagnosis not present

## 2024-08-21 DIAGNOSIS — R112 Nausea with vomiting, unspecified: Secondary | ICD-10-CM | POA: Diagnosis not present

## 2024-08-21 DIAGNOSIS — R7881 Bacteremia: Secondary | ICD-10-CM | POA: Diagnosis not present

## 2024-08-21 DIAGNOSIS — I152 Hypertension secondary to endocrine disorders: Secondary | ICD-10-CM | POA: Diagnosis not present

## 2024-08-21 DIAGNOSIS — Z89512 Acquired absence of left leg below knee: Secondary | ICD-10-CM | POA: Diagnosis not present

## 2024-08-21 DIAGNOSIS — R7401 Elevation of levels of liver transaminase levels: Secondary | ICD-10-CM | POA: Diagnosis not present

## 2024-08-21 DIAGNOSIS — E1152 Type 2 diabetes mellitus with diabetic peripheral angiopathy with gangrene: Secondary | ICD-10-CM | POA: Diagnosis not present

## 2024-08-21 DIAGNOSIS — D649 Anemia, unspecified: Secondary | ICD-10-CM | POA: Insufficient documentation

## 2024-08-21 DIAGNOSIS — K219 Gastro-esophageal reflux disease without esophagitis: Secondary | ICD-10-CM | POA: Diagnosis present

## 2024-08-21 DIAGNOSIS — L03116 Cellulitis of left lower limb: Secondary | ICD-10-CM | POA: Diagnosis present

## 2024-08-21 DIAGNOSIS — M47814 Spondylosis without myelopathy or radiculopathy, thoracic region: Secondary | ICD-10-CM | POA: Diagnosis not present

## 2024-08-21 DIAGNOSIS — K760 Fatty (change of) liver, not elsewhere classified: Secondary | ICD-10-CM | POA: Diagnosis not present

## 2024-08-21 DIAGNOSIS — L089 Local infection of the skin and subcutaneous tissue, unspecified: Secondary | ICD-10-CM | POA: Diagnosis not present

## 2024-08-21 DIAGNOSIS — A401 Sepsis due to streptococcus, group B: Secondary | ICD-10-CM | POA: Diagnosis not present

## 2024-08-21 DIAGNOSIS — B951 Streptococcus, group B, as the cause of diseases classified elsewhere: Secondary | ICD-10-CM | POA: Diagnosis not present

## 2024-08-21 DIAGNOSIS — R531 Weakness: Secondary | ICD-10-CM | POA: Diagnosis not present

## 2024-08-21 DIAGNOSIS — Z79899 Other long term (current) drug therapy: Secondary | ICD-10-CM | POA: Diagnosis not present

## 2024-08-21 DIAGNOSIS — E876 Hypokalemia: Secondary | ICD-10-CM | POA: Diagnosis not present

## 2024-08-21 DIAGNOSIS — M87075 Idiopathic aseptic necrosis of left foot: Secondary | ICD-10-CM | POA: Diagnosis not present

## 2024-08-21 DIAGNOSIS — R000 Tachycardia, unspecified: Secondary | ICD-10-CM | POA: Diagnosis not present

## 2024-08-21 DIAGNOSIS — F40240 Claustrophobia: Secondary | ICD-10-CM | POA: Diagnosis present

## 2024-08-21 DIAGNOSIS — M79673 Pain in unspecified foot: Secondary | ICD-10-CM | POA: Diagnosis not present

## 2024-08-21 DIAGNOSIS — S98112A Complete traumatic amputation of left great toe, initial encounter: Secondary | ICD-10-CM | POA: Diagnosis present

## 2024-08-21 DIAGNOSIS — D62 Acute posthemorrhagic anemia: Secondary | ICD-10-CM | POA: Diagnosis not present

## 2024-08-21 DIAGNOSIS — E1165 Type 2 diabetes mellitus with hyperglycemia: Secondary | ICD-10-CM | POA: Diagnosis not present

## 2024-08-21 DIAGNOSIS — Z794 Long term (current) use of insulin: Secondary | ICD-10-CM

## 2024-08-21 DIAGNOSIS — Z880 Allergy status to penicillin: Secondary | ICD-10-CM | POA: Diagnosis not present

## 2024-08-21 DIAGNOSIS — G8929 Other chronic pain: Secondary | ICD-10-CM | POA: Diagnosis not present

## 2024-08-21 DIAGNOSIS — Z89422 Acquired absence of other left toe(s): Secondary | ICD-10-CM | POA: Diagnosis not present

## 2024-08-21 DIAGNOSIS — Z7982 Long term (current) use of aspirin: Secondary | ICD-10-CM

## 2024-08-21 DIAGNOSIS — Z4781 Encounter for orthopedic aftercare following surgical amputation: Secondary | ICD-10-CM | POA: Diagnosis not present

## 2024-08-21 DIAGNOSIS — M5124 Other intervertebral disc displacement, thoracic region: Secondary | ICD-10-CM | POA: Diagnosis not present

## 2024-08-21 DIAGNOSIS — E875 Hyperkalemia: Secondary | ICD-10-CM | POA: Diagnosis not present

## 2024-08-21 DIAGNOSIS — L97819 Non-pressure chronic ulcer of other part of right lower leg with unspecified severity: Secondary | ICD-10-CM | POA: Diagnosis not present

## 2024-08-21 DIAGNOSIS — J984 Other disorders of lung: Secondary | ICD-10-CM | POA: Diagnosis present

## 2024-08-21 DIAGNOSIS — E1159 Type 2 diabetes mellitus with other circulatory complications: Secondary | ICD-10-CM | POA: Diagnosis present

## 2024-08-21 DIAGNOSIS — G8918 Other acute postprocedural pain: Secondary | ICD-10-CM | POA: Diagnosis not present

## 2024-08-21 DIAGNOSIS — E871 Hypo-osmolality and hyponatremia: Secondary | ICD-10-CM | POA: Diagnosis present

## 2024-08-21 DIAGNOSIS — M86372 Chronic multifocal osteomyelitis, left ankle and foot: Secondary | ICD-10-CM | POA: Diagnosis not present

## 2024-08-21 LAB — COMPREHENSIVE METABOLIC PANEL WITH GFR
ALT: 50 U/L — ABNORMAL HIGH (ref 0–44)
AST: 55 U/L — ABNORMAL HIGH (ref 15–41)
Albumin: 2.7 g/dL — ABNORMAL LOW (ref 3.5–5.0)
Alkaline Phosphatase: 160 U/L — ABNORMAL HIGH (ref 38–126)
Anion gap: 15 (ref 5–15)
BUN: 36 mg/dL — ABNORMAL HIGH (ref 6–20)
CO2: 29 mmol/L (ref 22–32)
Calcium: 8.4 mg/dL — ABNORMAL LOW (ref 8.9–10.3)
Chloride: 87 mmol/L — ABNORMAL LOW (ref 98–111)
Creatinine, Ser: 1.36 mg/dL — ABNORMAL HIGH (ref 0.61–1.24)
GFR, Estimated: >60 mL/min (ref >60)
Glucose, Bld: 284 mg/dL — ABNORMAL HIGH (ref 70–99)
Potassium: 3 mmol/L — ABNORMAL LOW (ref 3.5–5.1)
Sodium: 131 mmol/L — ABNORMAL LOW (ref 135–145)
Total Bilirubin: 1.6 mg/dL — ABNORMAL HIGH (ref 0.0–1.2)
Total Protein: 7.5 g/dL (ref 6.5–8.1)

## 2024-08-21 LAB — CBC WITH DIFFERENTIAL/PLATELET
Abs Immature Granulocytes: 0.16 K/uL — ABNORMAL HIGH (ref 0.00–0.07)
Basophils Absolute: 0 K/uL (ref 0.0–0.1)
Basophils Relative: 0 %
Eosinophils Absolute: 0 K/uL (ref 0.0–0.5)
Eosinophils Relative: 0 %
HCT: 34.3 % — ABNORMAL LOW (ref 39.0–52.0)
Hemoglobin: 11.5 g/dL — ABNORMAL LOW (ref 13.0–17.0)
Immature Granulocytes: 1 %
Lymphocytes Relative: 4 %
Lymphs Abs: 0.7 K/uL (ref 0.7–4.0)
MCH: 27.2 pg (ref 26.0–34.0)
MCHC: 33.5 g/dL (ref 30.0–36.0)
MCV: 81.1 fL (ref 80.0–100.0)
Monocytes Absolute: 1.1 K/uL — ABNORMAL HIGH (ref 0.1–1.0)
Monocytes Relative: 6 %
Neutro Abs: 17.7 K/uL — ABNORMAL HIGH (ref 1.7–7.7)
Neutrophils Relative %: 89 %
Platelets: 411 K/uL — ABNORMAL HIGH (ref 150–400)
RBC: 4.23 MIL/uL (ref 4.22–5.81)
RDW: 13.4 % (ref 11.5–15.5)
WBC: 19.7 K/uL — ABNORMAL HIGH (ref 4.0–10.5)
nRBC: 0 % (ref 0.0–0.2)

## 2024-08-21 LAB — PROTIME-INR
INR: 1.2 (ref 0.8–1.2)
Prothrombin Time: 15.5 s — ABNORMAL HIGH (ref 11.4–15.2)

## 2024-08-21 LAB — CBG MONITORING, ED: Glucose-Capillary: 273 mg/dL — ABNORMAL HIGH (ref 70–99)

## 2024-08-21 LAB — GLUCOSE, CAPILLARY
Glucose-Capillary: 291 mg/dL — ABNORMAL HIGH (ref 70–99)
Glucose-Capillary: 220 mg/dL — ABNORMAL HIGH (ref 70–99)

## 2024-08-21 LAB — I-STAT CG4 LACTIC ACID, ED: Lactic Acid, Venous: 1.6 mmol/L (ref 0.5–1.9)

## 2024-08-21 MED ORDER — FAMOTIDINE 20 MG PO TABS
40.0000 mg | ORAL_TABLET | Freq: Two times a day (BID) | ORAL | Status: DC
  Administered 2024-08-21 – 2024-08-24 (×7): 40 mg via ORAL
  Filled 2024-08-21 (×7): qty 2

## 2024-08-21 MED ORDER — VANCOMYCIN HCL 2000 MG/400ML IV SOLN
2000.0000 mg | Freq: Once | INTRAVENOUS | Status: AC
Start: 2024-08-21 — End: 2024-08-21
  Administered 2024-08-21: 2000 mg via INTRAVENOUS
  Filled 2024-08-21: qty 400

## 2024-08-21 MED ORDER — ATORVASTATIN CALCIUM 40 MG PO TABS
40.0000 mg | ORAL_TABLET | Freq: Every day | ORAL | Status: DC
  Administered 2024-08-21 – 2024-08-24 (×4): 40 mg via ORAL
  Filled 2024-08-21 (×4): qty 1

## 2024-08-21 MED ORDER — SODIUM CHLORIDE 0.9 % IV SOLN
3.0000 g | Freq: Once | INTRAVENOUS | Status: AC
  Administered 2024-08-21: 3 g via INTRAVENOUS
  Filled 2024-08-21: qty 8

## 2024-08-21 MED ORDER — LINEZOLID 600 MG/300ML IV SOLN
600.0000 mg | Freq: Once | INTRAVENOUS | Status: AC
  Administered 2024-08-21: 600 mg via INTRAVENOUS
  Filled 2024-08-21: qty 300

## 2024-08-21 MED ORDER — SODIUM CHLORIDE 0.9 % IV SOLN
INTRAVENOUS | Status: AC
Start: 2024-08-21 — End: 2024-08-22

## 2024-08-21 MED ORDER — VANCOMYCIN HCL 1250 MG/250ML IV SOLN
1250.0000 mg | INTRAVENOUS | Status: DC
  Filled 2024-08-21: qty 250

## 2024-08-21 MED ORDER — KETOROLAC TROMETHAMINE 15 MG/ML IJ SOLN: 15.0000 mg | Freq: Four times a day (QID) | INTRAMUSCULAR | Status: DC | PRN

## 2024-08-21 MED ORDER — ACETAMINOPHEN 325 MG PO TABS
650.0000 mg | ORAL_TABLET | Freq: Three times a day (TID) | ORAL | Status: DC
  Administered 2024-08-21 – 2024-08-22 (×2): 650 mg via ORAL
  Filled 2024-08-21 (×8): qty 2

## 2024-08-21 MED ORDER — ONDANSETRON 4 MG PO TBDP: 4.0000 mg | ORAL_TABLET | Freq: Three times a day (TID) | ORAL | Status: DC | PRN

## 2024-08-21 MED ORDER — MONTELUKAST SODIUM 10 MG PO TABS
10.0000 mg | ORAL_TABLET | Freq: Every day | ORAL | Status: DC
  Administered 2024-08-21 – 2024-08-23 (×3): 10 mg via ORAL
  Filled 2024-08-21 (×3): qty 1

## 2024-08-21 MED ORDER — INSULIN ASPART 100 UNIT/ML IJ SOLN
0.0000 [IU] | Freq: Three times a day (TID) | INTRAMUSCULAR | Status: DC
  Administered 2024-08-21: 8 [IU] via SUBCUTANEOUS
  Administered 2024-08-22: 5 [IU] via SUBCUTANEOUS
  Administered 2024-08-22: 2 [IU] via SUBCUTANEOUS
  Administered 2024-08-23: 5 [IU] via SUBCUTANEOUS
  Filled 2024-08-21: qty 5
  Filled 2024-08-21: qty 8
  Filled 2024-08-21: qty 2
  Filled 2024-08-21: qty 5

## 2024-08-21 MED ORDER — ALBUTEROL SULFATE (2.5 MG/3ML) 0.083% IN NEBU: 2.5000 mg | INHALATION_SOLUTION | Freq: Four times a day (QID) | RESPIRATORY_TRACT | Status: DC | PRN

## 2024-08-21 MED ORDER — INSULIN GLARGINE-YFGN 100 UNIT/ML ~~LOC~~ SOLN
15.0000 [IU] | Freq: Every day | SUBCUTANEOUS | Status: DC
  Administered 2024-08-21: 15 [IU] via SUBCUTANEOUS
  Filled 2024-08-21 (×2): qty 0.15

## 2024-08-21 MED ORDER — ACETAMINOPHEN 325 MG PO TABS
650.0000 mg | ORAL_TABLET | Freq: Once | ORAL | Status: AC | PRN
  Administered 2024-08-21: 650 mg via ORAL
  Filled 2024-08-21: qty 2

## 2024-08-21 MED ORDER — ENOXAPARIN SODIUM 40 MG/0.4ML IJ SOSY
40.0000 mg | PREFILLED_SYRINGE | INTRAMUSCULAR | Status: AC
  Administered 2024-08-21: 40 mg via SUBCUTANEOUS
  Filled 2024-08-21: qty 0.4

## 2024-08-21 MED ORDER — POTASSIUM CHLORIDE 20 MEQ PO PACK
40.0000 meq | PACK | Freq: Once | ORAL | Status: AC
  Administered 2024-08-21: 40 meq via ORAL
  Filled 2024-08-21: qty 2

## 2024-08-21 MED ORDER — ENOXAPARIN SODIUM 40 MG/0.4ML IJ SOSY: 40.0000 mg | PREFILLED_SYRINGE | INTRAMUSCULAR | Status: DC

## 2024-08-21 MED ORDER — PIPERACILLIN-TAZOBACTAM 3.375 G IVPB
3.3750 g | Freq: Three times a day (TID) | INTRAVENOUS | Status: DC
  Administered 2024-08-21 – 2024-08-22 (×3): 3.375 g via INTRAVENOUS
  Filled 2024-08-21 (×3): qty 50

## 2024-08-21 MED ORDER — SODIUM CHLORIDE 0.9 % IV BOLUS
1000.0000 mL | Freq: Once | INTRAVENOUS | Status: AC
Start: 2024-08-21 — End: 2024-08-21
  Administered 2024-08-21: 1000 mL via INTRAVENOUS

## 2024-08-21 NOTE — ED Provider Triage Note (Signed)
 Emergency Medicine Provider Triage Evaluation Note  Sean Carter , a 53 y.o. male  was evaluated in triage.  Pt complains of foot infection.    Review of Systems  Positive: Fever foot pain   Physical Exam  BP 127/71 (BP Location: Right Arm)   Pulse (!) 108   Temp (!) 102.2 F (39 C)   Resp 18   Ht 5' 7 (1.702 m)   Wt 99.8 kg   SpO2 93%   BMI 34.46 kg/m  Left lower extremity wrapped.  Some oozing through dressing.  Medical Decision Making  Medically screening exam initiated at 1:18 PM.  Appropriate orders placed.  Sean Carter was informed that the remainder of the evaluation will be completed by another provider, this initial triage assessment does not replace that evaluation, and the importance of remaining in the ED until their evaluation is complete.  Patient febrile with foot infection.  Potential sepsis.  Started antibiotics and lower extremity order set.  Cultures and lactic acid ordered.  Waiting to get to room.   Patsey Lot, MD 08/21/24 1319

## 2024-08-21 NOTE — Assessment & Plan Note (Signed)
 Hgb of 11.5 on admission. - Continue to monitor as above

## 2024-08-21 NOTE — Plan of Care (Signed)
 FMTS Brief Progress Note  S: Patient was seen and evaluated at bedside by myself and Dr. Howell. He states he is doing well and has no complaints.   O: BP (!) 121/59 (BP Location: Left Arm)   Pulse 84   Temp 99.2 F (37.3 C)   Resp 17   Ht 5' 7 (1.702 m)   Wt 99.8 kg   SpO2 95%   BMI 34.46 kg/m   GEN: well appearing male sitting up comfortably in hospital bed in NAD CV: RRR, no m/r/g RESP: CTAB, no w/r/r EXT: R BKA, L great toe amputation, L foot necrotic, see media tab for photos  A/P: - Continue plan as outlined by day team H&P - NPO at midnight for L BKA tomorrow, 11/04 - Orders reviewed. Labs for AM ordered, which was adjusted as needed.  - If condition changes, plan includes pain management, vascular consult.   Sean Credit, DO 08/21/2024, 8:29 PM PGY-1,  Family Medicine Night Resident  Please page [phone] with questions.

## 2024-08-21 NOTE — Assessment & Plan Note (Addendum)
 T2DM: 15u glargine daily with moderate SSI. (home insulin  glargine 40 units daily, Humalog  20 units TID) HTN: Held home Amlodipine  10 mg daily, hydrochlorothiazide  25 mg daily given normal Bps on admission HLD: Continuing home Atorvastatin  40 mg daily Restrictive Lung Disease: Continuing home montelukast  10 mg daily, albuterol  prn GERD: Continuing home famotidine  40 mg BID

## 2024-08-21 NOTE — ED Triage Notes (Signed)
 Pt BIB [facility] from home d/;t possible Lt foot infection, does have an odor to that foot. Has had n/v/d & not able to stomach his HTN meds, CBG 340, 110 bpm, 22-24 resp, 91% on RA & 98% on 2L via n/c, 101.2 temp. Did get 500cc LR & 4mg  zofran  via 20g Ltr hand PIV.

## 2024-08-21 NOTE — Plan of Care (Signed)

## 2024-08-21 NOTE — Assessment & Plan Note (Signed)
 K of 3.0 on admission. Repleted with 40 mEq once. - Continue to monitor

## 2024-08-21 NOTE — H&P (Addendum)
 Hospital Admission History and Physical Service Pager: [phone]  Patient name: Sean Carter Medical record number: [id number] Date of Birth: Aug 23, 1971 Age: 53 y.o. Gender: male  Primary Care Provider: Toma Matas, MD Consultants: Vascular surgery Code Status: Full Preferred Emergency Contact:  Contact Information     Name Relation Home Work Mobile   Thompson,Frances Significant other [phone]     Chief Complaint: left foot infection  Differential and Medical Decision Making:  Sean Carter is a 53 y.o. male presenting with left foot infection. PMHx significant for PAD s/p right BKA (08/2023), s/p left great toe amputation 2/2 gangrene; T2DM, HTN, HLD, restrictive lung disease, GERD  Most concerning for osteomyelitis given depth and appearance of wound along with Xray, patient history and elevated white count to 19.7. Also with sepsis; patient with fever up to 102.2, HR mildly elevated to 108, white count; thankfully, with normal lactic acid at 1.6, not requiring O2 at this time, BP, RR, O2 stable. Low concern for DVT given doppler signal present on exam by VVS. Treating with antibiotics, vascular on board for likely amputation.  Assessment & Plan Foot osteomyelitis, left ([name]) Sepsis ([name]) Strong suspicion that foot will require amputation, vascular aware and to follow. - Admit to FMTS, attending Dr. Gwynneth Keeling - Vascular consulted, appreciate recs - Considering angiogram - Med Surg level of care, Vital signs per floor - Carb modified diet  - VTE prophylaxis: Lovenox  - Fluids: s/p 1 L NS, maintenance with NS at 125 mL/hr - Antibiotics: s/p one-time IV Unasyn  3 g and IV linezolid 600 mg; START Zosyn  for pseudomonal coverage (11/3- ); will plan to start Vanc tomorrow 11/4 - Pain: PO tylenol  650 mg q8h (scheduled), PRN IV Ketorolac  15 mg q6h (for moderate pain) - Zofran  ODT 4 mg q8h prn for N/V - Holding aspirin  81 mg daily pending vascular recs - Labs: - Blood  cultures pending - AM: CBC, BMP, A1c - Fall precautions - PT/OT to treat AKI (acute kidney injury) Cr of 1.36 on admission. Baseline appears around 1-1.1. Suspect prerenal in setting of acute illness with poor po intake and N/V. - Fluids as above Anemia Hgb of 11.5 on admission. - Continue to monitor as above Hypokalemia K of 3.0 on admission. Repleted with 40 mEq once. - Continue to monitor Hyponatremia Na of 131. Suspect in setting of recent N/V. Receiving NS fluids as above - Continue to monitor Chronic health problem T2DM: 15u glargine daily with moderate SSI. (home insulin  glargine 40 units daily, Humalog  20 units TID) HTN: Held home Amlodipine  10 mg daily, hydrochlorothiazide  25 mg daily given normal Bps on admission HLD: Continuing home Atorvastatin  40 mg daily Restrictive Lung Disease: Continuing home montelukast  10 mg daily, albuterol  prn GERD: Continuing home famotidine  40 mg BID   FEN/GI: Carb modified VTE Prophylaxis: Lovenox   Disposition: Med Surg  History of Present Illness:  TAHMID Carter is a 53 y.o. male presenting with left foot wound concerning for osteomyelitis.  Ongoing 3 weeks worsening foot pain and appearance following a fall at work; also got his foot wet at work (with tap water ). Been using sock given by podiatrist. Fever and chills for a week. No trouble breathing. No chest pain, palpitations. Vomiting past 2 days with eating/drinking; has been able to keep down water  today. No blood in vomit. No diarrhea, constipation. No urinary symptoms reported.  In the ED, given IV Unasyn  3 g and IV linezolid 600 mg once. Bolused with 1000 mL NS,  maintenance with NS at 125 mL/hr. Xray of left foot showed possible osteomyelitis. VSS performed bedside doppler of L foot with +PT pulse  Review Of Systems: Per HPI  Pertinent Past Medical History: - PAD s/p right BKA (08/2023), s/p left great toe amputation 2/2 gangrene - T2DM - HTN - HLD - restrictive lung  disease - GERD Remainder reviewed in history tab.   Pertinent Past Surgical History: - 09/02/2023: Right BKA  Remainder reviewed in history tab.   Pertinent Social History: Tobacco use: None Alcohol  use: None Other Substance use: None Lives with partner  Pertinent Family History: N/A  Important Outpatient Medications: - insulin  glargine 40 units daily - Humalog  20 units TID - Amlodipine  10 mg daily - hydrochlorothiazide  25 mg daily - Atorvastatin  40 mg daily - montelukast  10 mg daily - albuterol  prn - aspirin  81 mg daily - Famotidine  40 mg BID - ropinirole  1 mg daily at bedtime   Objective: BP 119/66   Pulse 93   Temp (!) 102.2 F (39 C)   Resp 18   Ht 5' 7 (1.702 m)   Wt 99.8 kg   SpO2 100%   BMI 34.46 kg/m  Exam: General: Patient lying back in bed, no acute distress Eyes: Sclera nonicteric bilaterally, conjunctiva noninjected bilaterally Neck: Supple Cardiovascular: Regular rate and rhythm, no murmurs/rubs/gallops Respiratory: Normal work of breathing on room air, normal lung sounds without wheezing/crackles Gastrointestinal: Bowel sounds present and normoactive bilaterally. Soft, nondistended, nontender. EXT: Grossly normal strength and sensation of upper extremities.  S/p right BKA; site covered by sock.  Left foot with necrosis of midfoot, surrounded by erythema and edema, painful to palpation (see pictures in media).  Left PT pulse not palpated at bedside, but was present with bedside Doppler done by VSS.  Patient able to wiggle toes of left foot, reports still has sensation in this foot.  Left leg without significant edema, erythema. Neuro: Alert and appropriately responding to questions.  No focal deficits. Psych: Appropriate affect.  Labs:  CBC BMET  Recent Labs  Lab 08/21/24 1308  WBC 19.7*  HGB 11.5*  HCT 34.3*  PLT 411*   Recent Labs  Lab 08/21/24 1308  NA 131*  K 3.0*  CL 87*  CO2 29  BUN 36*  CREATININE 1.36*  GLUCOSE 284*  CALCIUM   8.4*    Lactic acid: 1.6 Blood cultures pending  EKG: My own interpretation (not copied from electronic read): Sinus rhythm    Imaging Studies Performed:  Xray Left Foot: Possible osteomyelitis of the medial cortex of the left navicular bone with overlying soft tissue gas.   Larraine Palma, MD 08/21/2024, 3:55 PM PGY-1, [facility] Family Medicine  FPTS Intern pager: [phone], text pages welcome Secure chat group [facility] Teaching Service   I have verified that the service and findings are accurately documented in the resident's note above.  Damien Cassis, MD                  08/21/2024, 6:33 PM

## 2024-08-21 NOTE — Consult Note (Addendum)
 Hospital Consult    Reason for Consult:  gangrene left foot Requesting Physician:  ER MRN #:  [id number]  History of Present Illness: This is a 53 y.o. male who presented to the hospital with left foot infection with fevers.  He has a leukocytosis of 19.7k.  His creatinine today is 1.36.    Pt has hx of  ray amputation of left great toe in 2021 by Dr. Gretta.  He underwent right 1st and 2nd partial ray amputation on 07/22/2022 by Dr. Magda.   On 01/18/2023, he underwent arteriogram with attempted angioplasty of the right PTA by Dr. Eliza.  On 01/19/2023, he underwent open right TMA with vac closure by Dr. Sheree.   On 01/22/2023, he underwent washout and partial closure of his right TMA with vac placement also by Dr. Sheree.  He subsequently underwent left BKA on 09/02/2023 by Dr. Serene.   He was last seen on 12/20/2023 and his amp was healing well.  He was offered return to keep follow up on the left foot but he would contact us  if he had any issues.  He called our office last week for concerns of his left foot but was not seen.  He states he has been working many hours a week and states he did not have a ride to our office.  He states his foot has been painful for about 3 weeks.  He states he fell.    He states he has been using a Duda sock and wrapping it with an ace wrap.  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   He states he has had fever and chills and been vomiting as well over the past 2-3 days.  His significant other, Cathlean at bedside and tearful as she did not know the extent of what has been going on with his foot.  He states he has been walking with prosthesis.    He has hx of DM and last A1c on 05/03/2024 was 7.6.  The pt is on a statin for cholesterol management.  The pt is on a daily aspirin .   Other AC:  none The pt is  on diuretic for hypertension.   The pt is  on medication for diabetes PTA. Tobacco hx:  never  Past Medical History:  Diagnosis Date   Blurry vision  07/10/2020   Diabetes mellitus without complication ([name])    Gangrene of toe of left foot ([name])    Peripheral arterial disease    Sepsis due to Streptococcus, group B ([name]) 06/20/2020   Streptococcal bacteremia     Past Surgical History:  Procedure Laterality Date   ABDOMINAL AORTOGRAM W/LOWER EXTREMITY N/A 01/18/2023   Procedure: ABDOMINAL AORTOGRAM W/LOWER EXTREMITY;  Surgeon: Eliza Lonni RAMAN, MD;  Location: [facility] INVASIVE CV LAB;  Service: Cardiovascular;  Laterality: N/A;   AMPUTATION Left 05/31/2020   Procedure: Ray Amputation of Left Great Toe with Negative Pressure Vac Placement;  Surgeon: Gretta Lonni PARAS, MD;  Location: [facility] OR;  Service: Vascular;  Laterality: Left;   AMPUTATION Right 07/22/2022   Procedure: RIGHT FIRST AND SECOND RAY PARTIAL  AMPUTATION;  Surgeon: Magda Debby SAILOR, MD;  Location: [facility] OR;  Service: Vascular;  Laterality: Right;   AMPUTATION Right 09/02/2023   Procedure: AMPUTATION BELOW KNEE;  Surgeon: Serene Gaile ORN, MD;  Location: [facility] OR;  Service: Vascular;  Laterality: Right;   APPLICATION OF WOUND VAC Right 01/22/2023   Procedure: APPLICATION OF WOUND VAC, RIGHT FOOT;  Surgeon:  Sheree Penne Bruckner, MD;  Location: [facility] OR;  Service: Vascular;  Laterality: Right;   INCISION AND DRAINAGE OF WOUND Right 01/22/2023   Procedure: WASHOUT OF RIGHT FOOT WOUND;  Surgeon: Sheree Penne Bruckner, MD;  Location: [facility] OR;  Service: Vascular;  Laterality: Right;   PERIPHERAL VASCULAR BALLOON ANGIOPLASTY  01/18/2023   Procedure: PERIPHERAL VASCULAR BALLOON ANGIOPLASTY;  Surgeon: Eliza Bruckner RAMAN, MD;  Location: [facility] INVASIVE CV LAB;  Service: Cardiovascular;;   STUMP REVISION Right 01/22/2023   Procedure: PARTIAL CLOSURE OF RIGHT FOOT WOUND;  Surgeon: Sheree Penne Bruckner, MD;  Location: [facility] OR;  Service: Vascular;  Laterality: Right;   TRANSMETATARSAL AMPUTATION Right 01/19/2023   Procedure: TRANSMETATARSAL AMPUTATION;  Surgeon: Sheree Penne Bruckner, MD;   Location: [facility] OR;  Service: Vascular;  Laterality: Right;    Allergies  Allergen Reactions   Scallops [Shellfish Allergy] Anaphylaxis   Penicillins Other (See Comments)    Unknown reaction as a child    Prior to Admission medications   Medication Sig Start Date End Date Taking? Authorizing Provider  acetaminophen  (TYLENOL ) 325 MG tablet Take 2 tablets (650 mg total) by mouth every 6 (six) hours as needed for headache or mild pain. 03/18/23   Jennelle Penne, MD  albuterol  (VENTOLIN  HFA) 108 (90 Base) MCG/ACT inhaler Inhale 2 puffs into the lungs every 6 (six) hours as needed for wheezing or shortness of breath. 08/04/23   Jennelle Penne, MD  amLODipine  (NORVASC ) 10 MG tablet TAKE 1 TABLET BY MOUTH AT BEDTIME 04/17/24   Jennelle Penne, MD  aspirin  EC 81 MG tablet Take 1 tablet (81 mg total) by mouth daily. Swallow whole. 01/26/23   Gerome Maurilio HERO, PA-C  atorvastatin  (LIPITOR) 40 MG tablet Take 1 tablet by mouth once daily 11/05/23   Sowell, Brandon, MD  Blood Gluc Meter Disp-Strips (RELION ALL-IN-ONE) DEVI 1 each by Does not apply route 3 (three) times daily as needed. Patient not taking: Reported on 12/20/2023 07/02/20   Brimage, Vondra, DO  blood glucose meter kit and supplies KIT Dispense based on patient and insurance preference. Use up to four times daily as directed. Patient not taking: Reported on 12/20/2023 02/17/23   Sowell, Brandon, MD  Blood Glucose Monitoring Suppl DEVI 1 each by Does not apply route in the morning, at noon, and at bedtime. May substitute to any manufacturer covered by patient's insurance. Patient not taking: Reported on 12/20/2023 01/29/23   Jennelle Penne, MD  cetirizine  (ZYRTEC ) 10 MG tablet Take 1 tablet by mouth once daily 02/18/24   Sowell, Brandon, MD  Continuous Glucose Sensor (DEXCOM G7 SENSOR) MISC 3 Devices by Does not apply route as directed. Apply new sensor every 10 days 09/06/23   McDiarmid, Krystal BIRCH, MD  famotidine  (PEPCID ) 40 MG tablet Take 1 tablet (40 mg total)  by mouth 2 (two) times daily. 06/20/24   Shitarev, Dimitry, MD  ferrous sulfate  325 (65 FE) MG tablet Take 1 tablet (325 mg total) by mouth every other day. Patient not taking: Reported on 12/20/2023 09/07/23   Shitarev, Dimitry, MD  hydrochlorothiazide  (HYDRODIURIL ) 25 MG tablet Take 1 tablet (25 mg total) by mouth daily. 10/01/23   Howell Lunger, DO  Insulin  Glargine (BASAGLAR  KWIKPEN) 100 UNIT/ML Inject 40 Units into the skin daily. 01/25/24   McDiarmid, Krystal BIRCH, MD  insulin  lispro (HUMALOG  KWIKPEN) 100 UNIT/ML KwikPen Inject 20 Units into the skin with breakfast, with lunch, and with evening meal. 01/25/24   McDiarmid, Krystal BIRCH, MD  ipratropium (ATROVENT ) 0.03 % nasal spray  Place 2 sprays into both nostrils every 12 (twelve) hours. Patient not taking: Reported on 12/20/2023 08/26/23   Howell Lunger, DO  montelukast  (SINGULAIR ) 10 MG tablet Take 1 tablet (10 mg total) by mouth at bedtime. 03/21/24   Jennelle Riis, MD  ReliOn Lancets Thin 26G MISC 1 each by Does not apply route 3 (three) times daily as needed. 10/04/20   Delores Suzann HERO, MD  rOPINIRole  (REQUIP ) 1 MG tablet TAKE 1 TABLET BY MOUTH AT BEDTIME 03/27/24   Jennelle Riis, MD  traMADol  (ULTRAM ) 50 MG tablet Take 1 tablet (50 mg total) by mouth every 6 (six) hours as needed. Patient not taking: Reported on 12/20/2023 09/22/23 09/21/24  Baglia, Teretha, PA-C  ULTICARE INSULIN  SYRINGE 30G X 1/2 0.3 ML MISC See admin instructions. 06/03/20   [provider]    Social History   Socioeconomic History   Marital status: Married    Spouse name: Not on file   Number of children: Not on file   Years of education: Not on file   Highest education level: 12th grade  Occupational History   Not on file  Tobacco Use   Smoking status: Never    Passive exposure: Never   Smokeless tobacco: Never  Vaping Use   Vaping status: Never Used  Substance and Sexual Activity   Alcohol  use: Not on file   Drug use: Never   Sexual activity: Not on file   Other Topics Concern   Not on file  Social History Narrative   Not on file   Social Drivers of Health   Financial Resource Strain: High Risk (05/02/2024)   Overall Financial Resource Strain (CARDIA)    Difficulty of Paying Living Expenses: Hard  Food Insecurity: No Food Insecurity (05/02/2024)   Hunger Vital Sign    Worried About Running Out of Food in the Last Year: Never true    Ran Out of Food in the Last Year: Never true  Transportation Needs: Unmet Transportation Needs (05/02/2024)   PRAPARE - Transportation    Lack of Transportation (Medical): Yes    Lack of Transportation (Non-Medical): Yes  Physical Activity: Insufficiently Active (05/02/2024)   Exercise Vital Sign    Days of Exercise per Week: 2 days    Minutes of Exercise per Session: 30 min  Stress: No Stress Concern Present (05/02/2024)   [employer] of Occupational Health - Occupational Stress Questionnaire    Feeling of Stress: Only a little  Social Connections: Moderately Integrated (05/02/2024)   Social Connection and Isolation Panel    Frequency of Communication with Friends and Family: More than three times a week    Frequency of Social Gatherings with Friends and Family: Once a week    Attends Religious Services: More than 4 times per year    Active Member of [employer] or Organizations: No    Attends Engineer, Structural: Not on file    Marital Status: Living with partner  Intimate Partner Violence: Not At Risk (09/01/2023)   Humiliation, Afraid, Rape, and Kick questionnaire    Fear of Current or Ex-Partner: No    Emotionally Abused: No    Physically Abused: No    Sexually Abused: No     ROS: [x]  Positive   [ ]  Negative   [ ]  All sytems reviewed and are negative  Cardiac: []  chest pain/pressure []  hx MI []  SOB   Vascular: [x]  non-healing ulcers  Pulmonary: []  asthma/wheezing []  home O2  Neurologic: []  hx of CVA []  mini  stroke   Hematologic: []  hx of cancer  Endocrine:   [x]   diabetes   GI []  GERD  GU: []  CKD/renal failure []  HD--[]  M/W/F or []  T/T/S  Psychiatric: []  anxiety []  depression  Musculoskeletal: []  arthritis []  joint pain  Integumentary: []  rashes []  ulcers  Constitutional: [x]  fever  [x]  chills [x]  N/V  Physical Examination  Vitals:   08/21/24 1228  BP: 127/71  Pulse: (!) 108  Resp: 18  Temp: (!) 102.2 F (39 C)  SpO2: 93%   Body mass index is 34.46 kg/m.  General:  WDWN in NAD Gait: Not observed HENT: WNL, normocephalic Pulmonary: normal non-labored breathing Cardiac: regular Abdomen:  soft Skin: without rashes Vascular Exam/Pulses: Bilateral femoral pulses are palpable Left PT and AT doppler signals present Extremities:   Gangrenous changes to most of foot and is malodorous.      Musculoskeletal: no muscle wasting or atrophy  Neurologic: A&O X 3 Psychiatric:  The pt has Normal affect.   CBC    Component Value Date/Time   WBC 19.7 (H) 08/21/2024 1308   RBC 4.23 08/21/2024 1308   HGB 11.5 (L) 08/21/2024 1308   HGB 15.0 05/03/2024 1039   HCT 34.3 (L) 08/21/2024 1308   HCT 45.7 05/03/2024 1039   PLT 411 (H) 08/21/2024 1308   PLT 327 05/03/2024 1039   MCV 81.1 08/21/2024 1308   MCV 86 05/03/2024 1039   MCH 27.2 08/21/2024 1308   MCHC 33.5 08/21/2024 1308   RDW 13.4 08/21/2024 1308   RDW 13.3 05/03/2024 1039   LYMPHSABS 0.7 08/21/2024 1308   LYMPHSABS 2.3 05/03/2024 1039   MONOABS 1.1 (H) 08/21/2024 1308   EOSABS 0.0 08/21/2024 1308   EOSABS 0.2 05/03/2024 1039   BASOSABS 0.0 08/21/2024 1308   BASOSABS 0.0 05/03/2024 1039    BMET    Component Value Date/Time   NA 131 (L) 08/21/2024 1308   NA 138 05/03/2024 1039   K 3.0 (L) 08/21/2024 1308   CL 87 (L) 08/21/2024 1308   CO2 29 08/21/2024 1308   GLUCOSE 284 (H) 08/21/2024 1308   BUN 36 (H) 08/21/2024 1308   BUN 21 05/03/2024 1039   CREATININE 1.36 (H) 08/21/2024 1308   CREATININE 0.78 07/10/2020 0910   CALCIUM  8.4 (L) 08/21/2024 1308    GFRNONAA >60 08/21/2024 1308   GFRNONAA 106 07/10/2020 0910   GFRAA 123 07/10/2020 0910    COAGS: Lab Results  Component Value Date   INR 1.2 08/21/2024   INR 1.1 05/30/2020   INR 0.9 01/10/2008     Non-Invasive Vascular Imaging:   ABI 01/18/2023 +-------+-----------+-----------+------------+------------+  ABI/TBIToday's ABIToday's TBIPrevious ABIPrevious TBI  +-------+-----------+-----------+------------+------------+  Right 1.40                                            +-------+-----------+-----------+------------+------------+  Left  1.50       0.55                                 +-------+-----------+-----------+------------+------------+     ASSESSMENT/PLAN: This is a 53 y.o. male with hx of DM and now with gangrenous changes to the left foot.  He has left BKA last year by Dr. Serene.   -left foot is malodorous and does not appear salvageable and may be septic given fever and N/V.  He does have doppler flow in the left PT and AT.  Discussed he most likely will need amputation.  Discussed possibility of angiogram to determine level of amputation but he does have doppler signals.  He also has an AKI with creatinine of 1.36 probably from dehydration.   -Dr. Gretta to evaluate pt and determine further plan   Lucie Apt, PA-C [facility] [phone]  I have seen and evaluated the patient. I agree with the PA note as documented above.  53 year old male well-known to vascular surgery having previously undergone a right BKA with Dr. Serene last year.  Now with significant ischemic and necrotic changes to the left foot that he states started about 2 weeks ago.  He does have a palpable popliteal pulse on the left on exam.  I discussed I do not think the foot is salvageable.  I have recommended left below-knee amputation.  I will post him tomorrow for Dr. Sheree.  N.p.o. after midnight.  Consent order placed.  Lonni DOROTHA Gretta,  MD [facility] [place] Office: [phone]

## 2024-08-21 NOTE — Assessment & Plan Note (Signed)
 Cr of 1.36 on admission. Baseline appears around 1-1.1. Suspect prerenal in setting of acute illness with poor po intake and N/V. - Fluids as above

## 2024-08-21 NOTE — ED Provider Notes (Signed)
 [place] EMERGENCY DEPARTMENT AT [facility] Provider Note   CSN: [id number] Arrival date & time: 08/21/24  1221     Patient presents with: Wound Infection   Sean Carter is a 53 y.o. male.   HPI Patient presents with left foot infection.  History of gangrene.  Now with fevers.  Increased pain.  Has been seen by vascular surgery previously who took off the great toe on that foot but also his right lower leg.  Sees family practice.   Past Medical History:  Diagnosis Date   Blurry vision 07/10/2020   Diabetes mellitus without complication ([name])    Gangrene of toe of left foot ([name])    Peripheral arterial disease    Sepsis due to Streptococcus, group B ([name]) 06/20/2020   Streptococcal bacteremia     Prior to Admission medications   Medication Sig Start Date End Date Taking? Authorizing Provider  acetaminophen  (TYLENOL ) 325 MG tablet Take 2 tablets (650 mg total) by mouth every 6 (six) hours as needed for headache or mild pain. 03/18/23   Jennelle Riis, MD  albuterol  (VENTOLIN  HFA) 108 (90 Base) MCG/ACT inhaler Inhale 2 puffs into the lungs every 6 (six) hours as needed for wheezing or shortness of breath. 08/04/23   Jennelle Riis, MD  amLODipine  (NORVASC ) 10 MG tablet TAKE 1 TABLET BY MOUTH AT BEDTIME 04/17/24   Jennelle Riis, MD  aspirin  EC 81 MG tablet Take 1 tablet (81 mg total) by mouth daily. Swallow whole. 01/26/23   Gerome Maurilio HERO, PA-C  atorvastatin  (LIPITOR) 40 MG tablet Take 1 tablet by mouth once daily 11/05/23   Sowell, Brandon, MD  Blood Gluc Meter Disp-Strips (RELION ALL-IN-ONE) DEVI 1 each by Does not apply route 3 (three) times daily as needed. Patient not taking: Reported on 12/20/2023 07/02/20   Brimage, Vondra, DO  blood glucose meter kit and supplies KIT Dispense based on patient and insurance preference. Use up to four times daily as directed. Patient not taking: Reported on 12/20/2023 02/17/23   Sowell, Brandon, MD  Blood Glucose Monitoring Suppl DEVI  1 each by Does not apply route in the morning, at noon, and at bedtime. May substitute to any manufacturer covered by patient's insurance. Patient not taking: Reported on 12/20/2023 01/29/23   Jennelle Riis, MD  cetirizine  (ZYRTEC ) 10 MG tablet Take 1 tablet by mouth once daily 02/18/24   Sowell, Brandon, MD  Continuous Glucose Sensor (DEXCOM G7 SENSOR) MISC 3 Devices by Does not apply route as directed. Apply new sensor every 10 days 09/06/23   McDiarmid, Krystal BIRCH, MD  famotidine  (PEPCID ) 40 MG tablet Take 1 tablet (40 mg total) by mouth 2 (two) times daily. 06/20/24   Shitarev, Dimitry, MD  ferrous sulfate  325 (65 FE) MG tablet Take 1 tablet (325 mg total) by mouth every other day. Patient not taking: Reported on 12/20/2023 09/07/23   Shitarev, Dimitry, MD  hydrochlorothiazide  (HYDRODIURIL ) 25 MG tablet Take 1 tablet (25 mg total) by mouth daily. 10/01/23   Howell Lunger, DO  Insulin  Glargine (BASAGLAR  KWIKPEN) 100 UNIT/ML Inject 40 Units into the skin daily. 01/25/24   McDiarmid, Krystal BIRCH, MD  insulin  lispro (HUMALOG  KWIKPEN) 100 UNIT/ML KwikPen Inject 20 Units into the skin with breakfast, with lunch, and with evening meal. 01/25/24   McDiarmid, Krystal BIRCH, MD  ipratropium (ATROVENT ) 0.03 % nasal spray Place 2 sprays into both nostrils every 12 (twelve) hours. Patient not taking: Reported on 12/20/2023 08/26/23   Howell Lunger, DO  montelukast  (  SINGULAIR ) 10 MG tablet Take 1 tablet (10 mg total) by mouth at bedtime. 03/21/24   Jennelle Riis, MD  ReliOn Lancets Thin 26G MISC 1 each by Does not apply route 3 (three) times daily as needed. 10/04/20   Delores Suzann HERO, MD  rOPINIRole  (REQUIP ) 1 MG tablet TAKE 1 TABLET BY MOUTH AT BEDTIME 03/27/24   Jennelle Riis, MD  traMADol  (ULTRAM ) 50 MG tablet Take 1 tablet (50 mg total) by mouth every 6 (six) hours as needed. Patient not taking: Reported on 12/20/2023 09/22/23 09/21/24  Charlyne Reed, PA-C  ULTICARE INSULIN  SYRINGE 30G X 1/2 0.3 ML MISC See admin instructions.  06/03/20   [provider]    Allergies: Scallops [shellfish allergy] and Penicillins    Review of Systems  Updated Vital Signs BP 127/71 (BP Location: Right Arm)   Pulse (!) 108   Temp (!) 102.2 F (39 C)   Resp 18   Ht 5' 7 (1.702 m)   Wt 99.8 kg   SpO2 93%   BMI 34.46 kg/m   Physical Exam Constitutional:      Appearance: Normal appearance.  Cardiovascular:     Rate and Rhythm: Tachycardia present.  Pulmonary:     Breath sounds: No rhonchi.  Abdominal:     Tenderness: There is no abdominal tenderness.  Musculoskeletal:     Comments: Left foot with medial gangrene.  Does have some exposed bone.  Neurological:     Mental Status: He is alert.        (all labs ordered are listed, but only abnormal results are displayed) Labs Reviewed  CBC WITH DIFFERENTIAL/PLATELET - Abnormal; Notable for the following components:      Result Value   WBC 19.7 (*)    Hemoglobin 11.5 (*)    HCT 34.3 (*)    Platelets 411 (*)    Neutro Abs 17.7 (*)    Monocytes Absolute 1.1 (*)    Abs Immature Granulocytes 0.16 (*)    All other components within normal limits  PROTIME-INR - Abnormal; Notable for the following components:   Prothrombin Time 15.5 (*)    All other components within normal limits  CBG MONITORING, ED - Abnormal; Notable for the following components:   Glucose-Capillary 273 (*)    All other components within normal limits  CULTURE, BLOOD (ROUTINE X 2)  CULTURE, BLOOD (ROUTINE X 2)  COMPREHENSIVE METABOLIC PANEL WITH GFR  I-STAT CG4 LACTIC ACID, ED    EKG: None  Radiology: No results found.   Procedures   Medications Ordered in the ED  sodium chloride  0.9 % bolus 1,000 mL (has no administration in time range)    And  0.9 %  sodium chloride  infusion (has no administration in time range)  Ampicillin -Sulbactam (UNASYN ) 3 g in sodium chloride  0.9 % 100 mL IVPB (has no administration in time range)    And  linezolid (ZYVOX) IVPB 600 mg (has no  administration in time range)  acetaminophen  (TYLENOL ) tablet 650 mg (650 mg Oral Given 08/21/24 1311)                                    Medical Decision Making Amount and/or Complexity of Data Reviewed Labs: ordered. Radiology: ordered.  Risk OTC drugs. Prescription drug management.   Patient with fever and likely left lower extremity infection.  Foul-smelling.  X-ray done and does show exposed bone.  I think likely has  an osteomyelitis.  White count is elevated.  Antibiotics given per lower extremity wound protocol for severe infection.  Culture sent.  Will discuss with family practice for admission and vascular surgery.  Reviewed previous PCP note.  CRITICAL CARE Performed by: [place] Total critical care time: 30 minutes Critical care time was exclusive of separately billable procedures and treating other patients. Critical care was necessary to treat or prevent imminent or life-threatening deterioration. Critical care was time spent personally by me on the following activities: development of treatment plan with patient and/or surrogate as well as nursing, discussions with consultants, evaluation of patient's response to treatment, examination of patient, obtaining history from patient or surrogate, ordering and performing treatments and interventions, ordering and review of laboratory studies, ordering and review of radiographic studies, pulse oximetry and re-evaluation of patient's condition.  Patient's lactic acid is normal which decreases likelihood of severe sepsis.    Final diagnoses:  Left foot infection    ED Discharge Orders     None          River Rankin, MD 08/21/24 1355

## 2024-08-21 NOTE — Progress Notes (Addendum)
 Pharmacy Antibiotic Note  Sean Carter is a 53 y.o. male admitted on 08/21/2024 with wound infection.  Pharmacy has been consulted for vancomycin  dosing.  Presenting with L foot infection w/ fevers - has hx of PVD, last undergoing R BKA on 09/02/2023. WBC 19.7, LA 1.6, Tmax 102.2. Received 1 dose unasyn /linezolid in ED. Scr 1.36 (CrCl 70 mL/min). Has hx DM w/ last A1c 7.6% (04/2024).   Has PCN allergy listed as childhood allergy - received and tolerated earlier dose of unasyn .  Plan: Vancomycin  2g IV once then 1250 mg IV every 24 hours (estAUC 471, Vd 0.5) Zosyn  3.375g IV every 8 hours Monitor renal fx, clinical pic, cx results, and vanc level prn  Height: 5' 7 (170.2 cm) Weight: 99.8 kg (220 lb) IBW/kg (Calculated) : 66.1  Temp (24hrs), Avg:102 F (38.9 C), Min:101.7 F (38.7 C), Max:102.2 F (39 C)  Recent Labs  Lab 08/21/24 1308 08/21/24 1313  WBC 19.7*  --   CREATININE 1.36*  --   LATICACIDVEN  --  1.6    Estimated Creatinine Clearance: 70.7 mL/min (A) (by C-G formula based on SCr of 1.36 mg/dL (H)).    Allergies  Allergen Reactions   Scallops [Shellfish Allergy] Anaphylaxis   Penicillins Other (See Comments)    Unknown reaction as a child    Antimicrobials this admission: Unasyn /linezolid 11/3 x1 Vancomycin  11/3 >>  Zosyn  11/3 >>   Dose adjustments this admission: N/A  Microbiology results: 11/3 BCx: sent   Thank you for allowing pharmacy to participate in this patient's care,  Suzen Sour, PharmD, BCCCP Clinical Pharmacist  Phone: [phone] 08/21/2024 4:53 PM  Please check AMION for all [facility] Pharm[facility] phone numbers After 10:00 PM, call Main Pharmacy [phone]

## 2024-08-21 NOTE — Hospital Course (Addendum)
 Sean Carter is a 53 y.o. year old with a history of PAD s/p right BKA (08/2023), s/p left great toe amputation 2/2 gangrene; T2DM, HTN, HLD, restrictive lung disease, GERD who presented with left foot wound and was admitted to the [facility] Medicine Teaching Service for osteomyelitis with sepsis.  Left foot osteomyelitis Sepsis Patient with 3 weeks of foot pain, 1 week of fever prior to admission. Met sepsis criteria with fever, tachycardia, elevated white count of 19.7, and source of infection. In the ED, had a left foot Xray showing likely osteomyelitis of the medial cortex of the left navicular bone. Started on abx with IV Unasyn  and Linezolid (11/3). Also started on Zosyn  (11/3-***) and Vancomycin  (11/3-***). Vascular surgery was consulted, and recommended left BKA which was done 08/22/24.  AKI Cr elevated to 1.36 on admission, suspected prerenal 2/2 poor po intake and N/V in setting of illness. Improved with fluids and had normalized by time of discharge.  Hypokalemia K initially 3.0 on admission, suspected 2/2 poor po intake and N/V. Repleted. At time of discharge, had normalized.   Other chronic conditions were medically managed with home medications and formulary alternatives as necessary (T2DM, HTN, HLD, Restrictive Lung Disease, GERD).  PCP Follow-up Recommendations: Ensure follow-up with vascular surgery

## 2024-08-21 NOTE — Assessment & Plan Note (Signed)
 Na of 131. Suspect in setting of recent N/V. Receiving NS fluids as above - Continue to monitor

## 2024-08-21 NOTE — Assessment & Plan Note (Signed)
 Strong suspicion that foot will require amputation, vascular aware and to follow. - Admit to FMTS, attending Dr. Gwynneth Keeling - Vascular consulted, appreciate recs - Considering angiogram - Med Surg level of care, Vital signs per floor - Carb modified diet  - VTE prophylaxis: Lovenox  - Fluids: s/p 1 L NS, maintenance with NS at 125 mL/hr - Antibiotics: s/p one-time IV Unasyn  3 g and IV linezolid 600 mg; START Zosyn  for pseudomonal coverage (11/3- ); will plan to start Vanc tomorrow 11/4 - Pain: PO tylenol  650 mg q8h (scheduled), PRN IV Ketorolac  15 mg q6h (for moderate pain) - Zofran  ODT 4 mg q8h prn for N/V - Holding aspirin  81 mg daily pending vascular recs - Labs: - Blood cultures pending - AM: CBC, BMP, A1c - Fall precautions - PT/OT to treat

## 2024-08-22 ENCOUNTER — Inpatient Hospital Stay (HOSPITAL_COMMUNITY): Admitting: Certified Registered"

## 2024-08-22 ENCOUNTER — Encounter (HOSPITAL_COMMUNITY): Payer: Self-pay

## 2024-08-22 ENCOUNTER — Encounter (HOSPITAL_COMMUNITY)
Admission: EM | Disposition: A | Discharge status: Rehabilitation Facility | Payer: Self-pay | Source: Home / Self Care | Attending: Family Medicine

## 2024-08-22 DIAGNOSIS — Z89511 Acquired absence of right leg below knee: Secondary | ICD-10-CM

## 2024-08-22 DIAGNOSIS — M86372 Chronic multifocal osteomyelitis, left ankle and foot: Secondary | ICD-10-CM

## 2024-08-22 DIAGNOSIS — I10 Essential (primary) hypertension: Secondary | ICD-10-CM | POA: Diagnosis not present

## 2024-08-22 DIAGNOSIS — A401 Sepsis due to streptococcus, group B: Secondary | ICD-10-CM

## 2024-08-22 DIAGNOSIS — I96 Gangrene, not elsewhere classified: Secondary | ICD-10-CM

## 2024-08-22 HISTORY — PX: AMPUTATION: SHX166

## 2024-08-22 LAB — CBC
HCT: 29.1 % — ABNORMAL LOW (ref 39.0–52.0)
Hemoglobin: 9.7 g/dL — ABNORMAL LOW (ref 13.0–17.0)
MCH: 27.1 pg (ref 26.0–34.0)
MCHC: 33.3 g/dL (ref 30.0–36.0)
MCV: 81.3 fL (ref 80.0–100.0)
Platelets: 347 K/uL (ref 150–400)
RBC: 3.58 MIL/uL — ABNORMAL LOW (ref 4.22–5.81)
RDW: 13.3 % (ref 11.5–15.5)
WBC: 19.3 K/uL — ABNORMAL HIGH (ref 4.0–10.5)
nRBC: 0 % (ref 0.0–0.2)

## 2024-08-22 LAB — BLOOD CULTURE ID PANEL (REFLEXED) - BCID2

## 2024-08-22 LAB — BASIC METABOLIC PANEL WITH GFR
Anion gap: 13 (ref 5–15)
Anion gap: 13 (ref 5–15)
BUN: 27 mg/dL — ABNORMAL HIGH (ref 6–20)
BUN: 26 mg/dL — ABNORMAL HIGH (ref 6–20)
CO2: 27 mmol/L (ref 22–32)
CO2: 27 mmol/L (ref 22–32)
Calcium: 8 mg/dL — ABNORMAL LOW (ref 8.9–10.3)
Calcium: 7.9 mg/dL — ABNORMAL LOW (ref 8.9–10.3)
Chloride: 92 mmol/L — ABNORMAL LOW (ref 98–111)
Chloride: 96 mmol/L — ABNORMAL LOW (ref 98–111)
Creatinine, Ser: 1.19 mg/dL (ref 0.61–1.24)
Creatinine, Ser: 1.24 mg/dL (ref 0.61–1.24)
GFR, Estimated: >60 mL/min (ref >60)
GFR, Estimated: >60 mL/min (ref >60)
Glucose, Bld: 245 mg/dL — ABNORMAL HIGH (ref 70–99)
Glucose, Bld: 163 mg/dL — ABNORMAL HIGH (ref 70–99)
Potassium: 2.7 mmol/L — CL (ref 3.5–5.1)
Potassium: 3.3 mmol/L — ABNORMAL LOW (ref 3.5–5.1)
Sodium: 132 mmol/L — ABNORMAL LOW (ref 135–145)
Sodium: 136 mmol/L (ref 135–145)

## 2024-08-22 LAB — GLUCOSE, CAPILLARY
Glucose-Capillary: 230 mg/dL — ABNORMAL HIGH (ref 70–99)
Glucose-Capillary: 219 mg/dL — ABNORMAL HIGH (ref 70–99)
Glucose-Capillary: 201 mg/dL — ABNORMAL HIGH (ref 70–99)
Glucose-Capillary: 194 mg/dL — ABNORMAL HIGH (ref 70–99)
Glucose-Capillary: 141 mg/dL — ABNORMAL HIGH (ref 70–99)
Glucose-Capillary: 242 mg/dL — ABNORMAL HIGH (ref 70–99)

## 2024-08-22 LAB — HEMOGLOBIN A1C
Hgb A1c MFr Bld: 6.5 % — ABNORMAL HIGH (ref 4.8–5.6)
Mean Plasma Glucose: 139.85 mg/dL

## 2024-08-22 LAB — MAGNESIUM: Magnesium: 2.3 mg/dL (ref 1.7–2.4)

## 2024-08-22 SURGERY — AMPUTATION BELOW KNEE
Anesthesia: General | Site: Knee | Laterality: Left

## 2024-08-22 MED ORDER — ROCURONIUM BROMIDE 10 MG/ML (PF) SYRINGE
PREFILLED_SYRINGE | INTRAVENOUS | Status: AC
  Filled 2024-08-22: qty 10

## 2024-08-22 MED ORDER — 0.9 % SODIUM CHLORIDE (POUR BTL) OPTIME
TOPICAL | Status: DC | PRN
  Administered 2024-08-22: 1000 mL

## 2024-08-22 MED ORDER — ACETAMINOPHEN 10 MG/ML IV SOLN
INTRAVENOUS | Status: DC | PRN
  Administered 2024-08-22: 1000 mg via INTRAVENOUS

## 2024-08-22 MED ORDER — HYDROCODONE-ACETAMINOPHEN 5-325 MG PO TABS
1.0000 | ORAL_TABLET | ORAL | Status: DC | PRN
  Administered 2024-08-23: 1 via ORAL
  Administered 2024-08-23 (×2): 2 via ORAL
  Filled 2024-08-22: qty 1
  Filled 2024-08-22 (×2): qty 2

## 2024-08-22 MED ORDER — PROPOFOL 10 MG/ML IV BOLUS
INTRAVENOUS | Status: DC | PRN
  Administered 2024-08-22: 130 mg via INTRAVENOUS

## 2024-08-22 MED ORDER — PHENYLEPHRINE HCL-NACL 20-0.9 MG/250ML-% IV SOLN
INTRAVENOUS | Status: DC | PRN
  Administered 2024-08-22: 20 ug/min via INTRAVENOUS

## 2024-08-22 MED ORDER — ORAL CARE MOUTH RINSE: 15.0000 mL | Freq: Once | OROMUCOSAL | Status: AC

## 2024-08-22 MED ORDER — LIDOCAINE 2% (20 MG/ML) 5 ML SYRINGE
INTRAMUSCULAR | Status: DC | PRN
  Administered 2024-08-22: 40 mg via INTRAVENOUS

## 2024-08-22 MED ORDER — LACTATED RINGERS IV SOLN: INTRAVENOUS | Status: DC

## 2024-08-22 MED ORDER — ACETAMINOPHEN 10 MG/ML IV SOLN
INTRAVENOUS | Status: AC
  Filled 2024-08-22: qty 100

## 2024-08-22 MED ORDER — ROCURONIUM BROMIDE 10 MG/ML (PF) SYRINGE
PREFILLED_SYRINGE | INTRAVENOUS | Status: DC | PRN
  Administered 2024-08-22: 30 mg via INTRAVENOUS

## 2024-08-22 MED ORDER — POTASSIUM CHLORIDE CRYS ER 20 MEQ PO TBCR
40.0000 meq | EXTENDED_RELEASE_TABLET | Freq: Once | ORAL | Status: AC
  Administered 2024-08-22: 40 meq via ORAL
  Filled 2024-08-22: qty 2

## 2024-08-22 MED ORDER — ONDANSETRON HCL 4 MG/2ML IJ SOLN
INTRAMUSCULAR | Status: DC | PRN
  Administered 2024-08-22: 4 mg via INTRAVENOUS

## 2024-08-22 MED ORDER — HYDROCODONE-ACETAMINOPHEN 7.5-325 MG PO TABS
1.0000 | ORAL_TABLET | ORAL | Status: DC | PRN
  Administered 2024-08-23 – 2024-08-24 (×3): 2 via ORAL
  Filled 2024-08-22 (×4): qty 2

## 2024-08-22 MED ORDER — SUGAMMADEX SODIUM 200 MG/2ML IV SOLN
INTRAVENOUS | Status: DC | PRN
Start: 2024-08-22 — End: 2024-08-22
  Administered 2024-08-22: 200 mg via INTRAVENOUS

## 2024-08-22 MED ORDER — SODIUM CHLORIDE 0.9 % IV SOLN
3.0000 g | Freq: Four times a day (QID) | INTRAVENOUS | Status: DC
  Administered 2024-08-22 – 2024-08-23 (×3): 3 g via INTRAVENOUS
  Filled 2024-08-22 (×3): qty 8

## 2024-08-22 MED ORDER — PHENYLEPHRINE 80 MCG/ML (10ML) SYRINGE FOR IV PUSH (FOR BLOOD PRESSURE SUPPORT)
PREFILLED_SYRINGE | INTRAVENOUS | Status: AC
  Filled 2024-08-22: qty 20

## 2024-08-22 MED ORDER — POTASSIUM CHLORIDE 10 MEQ/100ML IV SOLN
10.0000 meq | INTRAVENOUS | Status: AC
  Administered 2024-08-22 (×4): 10 meq via INTRAVENOUS
  Filled 2024-08-22: qty 100

## 2024-08-22 MED ORDER — SENNOSIDES-DOCUSATE SODIUM 8.6-50 MG PO TABS
1.0000 | ORAL_TABLET | Freq: Every day | ORAL | Status: DC
Start: 2024-08-22 — End: 2024-08-22

## 2024-08-22 MED ORDER — SUCCINYLCHOLINE CHLORIDE 200 MG/10ML IV SOSY
PREFILLED_SYRINGE | INTRAVENOUS | Status: DC | PRN
  Administered 2024-08-22: 120 mg via INTRAVENOUS

## 2024-08-22 MED ORDER — LIDOCAINE 2% (20 MG/ML) 5 ML SYRINGE
INTRAMUSCULAR | Status: AC
  Filled 2024-08-22: qty 5

## 2024-08-22 MED ORDER — ACETAMINOPHEN 325 MG PO TABS: 325.0000 mg | ORAL_TABLET | Freq: Four times a day (QID) | ORAL | Status: DC | PRN

## 2024-08-22 MED ORDER — ROPIVACAINE HCL 5 MG/ML IJ SOLN
INTRAMUSCULAR | Status: DC | PRN
  Administered 2024-08-22: 50 mL via PERINEURAL

## 2024-08-22 MED ORDER — PROPOFOL 10 MG/ML IV BOLUS
INTRAVENOUS | Status: AC
  Filled 2024-08-22: qty 20

## 2024-08-22 MED ORDER — EPHEDRINE 5 MG/ML INJ
INTRAVENOUS | Status: AC
  Filled 2024-08-22: qty 10

## 2024-08-22 MED ORDER — INSULIN GLARGINE-YFGN 100 UNIT/ML ~~LOC~~ SOLN
20.0000 [IU] | Freq: Every day | SUBCUTANEOUS | Status: DC
  Administered 2024-08-22: 20 [IU] via SUBCUTANEOUS
  Filled 2024-08-22 (×3): qty 0.2

## 2024-08-22 MED ORDER — CHLORHEXIDINE GLUCONATE 0.12 % MT SOLN
15.0000 mL | Freq: Once | OROMUCOSAL | Status: AC
  Administered 2024-08-22: 15 mL via OROMUCOSAL
  Filled 2024-08-22: qty 15

## 2024-08-22 MED ORDER — POLYETHYLENE GLYCOL 3350 17 G PO PACK
17.0000 g | PACK | Freq: Every day | ORAL | Status: DC
Start: 2024-08-22 — End: 2024-08-22
  Filled 2024-08-22: qty 1

## 2024-08-22 MED ORDER — POTASSIUM CHLORIDE CRYS ER 20 MEQ PO TBCR
40.0000 meq | EXTENDED_RELEASE_TABLET | ORAL | Status: AC
Start: 2024-08-22 — End: 2024-08-22
  Administered 2024-08-22 (×2): 40 meq via ORAL
  Filled 2024-08-22 (×2): qty 2

## 2024-08-22 MED ORDER — MIDAZOLAM HCL (PF) 2 MG/2ML IJ SOLN
INTRAMUSCULAR | Status: DC | PRN
Start: 2024-08-22 — End: 2024-08-22
  Administered 2024-08-22: 2 mg via INTRAVENOUS

## 2024-08-22 MED ORDER — BACITRACIN ZINC 500 UNIT/GM EX OINT
TOPICAL_OINTMENT | CUTANEOUS | Status: AC
  Filled 2024-08-22: qty 28.35

## 2024-08-22 MED ORDER — FENTANYL CITRATE (PF) 250 MCG/5ML IJ SOLN
INTRAMUSCULAR | Status: DC | PRN
  Administered 2024-08-22: 100 ug via INTRAVENOUS

## 2024-08-22 MED ORDER — FENTANYL CITRATE (PF) 100 MCG/2ML IJ SOLN
INTRAMUSCULAR | Status: AC
  Filled 2024-08-22: qty 2

## 2024-08-22 MED ORDER — POTASSIUM CHLORIDE CRYS ER 20 MEQ PO TBCR: 40.0000 meq | EXTENDED_RELEASE_TABLET | Freq: Every day | ORAL | Status: DC | PRN

## 2024-08-22 MED ORDER — PROPOFOL 500 MG/50ML IV EMUL
INTRAVENOUS | Status: DC | PRN
  Administered 2024-08-22: 150 ug/kg/min via INTRAVENOUS

## 2024-08-22 MED ORDER — MORPHINE SULFATE (PF) 2 MG/ML IV SOLN: 0.5000 mg | INTRAVENOUS | Status: DC | PRN

## 2024-08-22 MED ORDER — LOPERAMIDE HCL 2 MG PO CAPS
2.0000 mg | ORAL_CAPSULE | Freq: Once | ORAL | Status: AC
  Administered 2024-08-22: 2 mg via ORAL
  Filled 2024-08-22: qty 1

## 2024-08-22 MED ORDER — MIDAZOLAM HCL 2 MG/2ML IJ SOLN
INTRAMUSCULAR | Status: AC
  Filled 2024-08-22: qty 2

## 2024-08-22 MED ORDER — ONDANSETRON HCL 4 MG/2ML IJ SOLN
INTRAMUSCULAR | Status: AC
  Filled 2024-08-22: qty 2

## 2024-08-22 SURGICAL SUPPLY — 46 items
BAG COUNTER SPONGE SURGICOUNT (BAG) ×2 IMPLANT
BLADE SAW GIGLI 510 (BLADE) IMPLANT
BLADE SAW THK.89X75X18XSGTL (BLADE) ×2 IMPLANT
BNDG COHESIVE 6X5 TAN ST LF (GAUZE/BANDAGES/DRESSINGS) ×2 IMPLANT
BNDG COMPR ESMARK 6X3 LF (GAUZE/BANDAGES/DRESSINGS) IMPLANT
BNDG ELASTIC 4X5.8 VLCR NS LF (GAUZE/BANDAGES/DRESSINGS) IMPLANT
BNDG ELASTIC 4X5.8 VLCR STR LF (GAUZE/BANDAGES/DRESSINGS) IMPLANT
BNDG ELASTIC 6INX 5YD STR LF (GAUZE/BANDAGES/DRESSINGS) IMPLANT
BNDG GAUZE DERMACEA FLUFF 4 (GAUZE/BANDAGES/DRESSINGS) IMPLANT
BUR DISC 0.8X25 (BURR) IMPLANT
CANISTER SUCTION 3000ML PPV (SUCTIONS) ×2 IMPLANT
CLIP LIGATING EXTRA MED SLVR (CLIP) ×2 IMPLANT
CLIP LIGATING EXTRA SM BLUE (MISCELLANEOUS) ×2 IMPLANT
COVER SURGICAL LIGHT HANDLE (MISCELLANEOUS) ×2 IMPLANT
CUFF TOURN SGL QUICK 42 (TOURNIQUET CUFF) IMPLANT
CUFF TRNQT CYL 34X4.125X (TOURNIQUET CUFF) IMPLANT
DRAIN CHANNEL 19F RND (DRAIN) IMPLANT
DRAPE HALF SHEET 40X57 (DRAPES) ×2 IMPLANT
DRAPE SURG ORHT 6 SPLT 77X108 (DRAPES) ×4 IMPLANT
DRSG ADAPTIC 3X8 NADH LF (GAUZE/BANDAGES/DRESSINGS) ×2 IMPLANT
ELECTRODE REM PT RTRN 9FT ADLT (ELECTROSURGICAL) ×2 IMPLANT
EVACUATOR SILICONE 100CC (DRAIN) IMPLANT
GAUZE PAD ABD 8X10 STRL (GAUZE/BANDAGES/DRESSINGS) IMPLANT
GAUZE SPONGE 4X4 12PLY STRL (GAUZE/BANDAGES/DRESSINGS) ×2 IMPLANT
GLOVE BIO SURGEON STRL SZ7.5 (GLOVE) ×2 IMPLANT
GOWN STRL REUS W/ TWL LRG LVL3 (GOWN DISPOSABLE) ×4 IMPLANT
GOWN STRL REUS W/ TWL XL LVL3 (GOWN DISPOSABLE) ×2 IMPLANT
KIT BASIN OR (CUSTOM PROCEDURE TRAY) ×2 IMPLANT
KIT TURNOVER KIT B (KITS) ×2 IMPLANT
PACK GENERAL/GYN (CUSTOM PROCEDURE TRAY) ×2 IMPLANT
PAD ARMBOARD POSITIONER FOAM (MISCELLANEOUS) ×4 IMPLANT
POWDER SURGICEL 3.0 GRAM (HEMOSTASIS) IMPLANT
RASP HELIOCORDIAL MED (MISCELLANEOUS) IMPLANT
SOLN 0.9% NACL POUR BTL 1000ML (IV SOLUTION) ×2 IMPLANT
SOLN STERILE WATER BTL 1000 ML (IV SOLUTION) ×2 IMPLANT
STAPLER SKIN PROX 35W (STAPLE) ×2 IMPLANT
STOCKINETTE IMPERVIOUS LG (DRAPES) ×2 IMPLANT
SUT BONE WAX W31G (SUTURE) IMPLANT
SUT ETHILON 3 0 PS 1 (SUTURE) IMPLANT
SUT SILK 0 TIES 10X30 (SUTURE) ×2 IMPLANT
SUT SILK 2-0 18XBRD TIE 12 (SUTURE) ×2 IMPLANT
SUT SILK 3-0 18XBRD TIE 12 (SUTURE) IMPLANT
SUT VIC AB 2-0 CT1 18 (SUTURE) ×4 IMPLANT
SUT VICRYL AB 2 0 TIES (SUTURE) ×2 IMPLANT
TOWEL GREEN STERILE (TOWEL DISPOSABLE) ×4 IMPLANT
UNDERPAD 30X36 HEAVY ABSORB (UNDERPADS AND DIAPERS) ×2 IMPLANT

## 2024-08-22 NOTE — Assessment & Plan Note (Addendum)
 T2DM: 15u glargine daily with moderate SSI; increase to 20u today (home insulin  glargine 40 units daily, Humalog  20 units TID) HTN: Held home Amlodipine  10 mg daily, hydrochlorothiazide  25 mg daily given normal Bps on admission HLD: Continuing home Atorvastatin  40 mg daily Restrictive Lung Disease: Continuing home montelukast  10 mg daily, albuterol  prn GERD: Continuing home famotidine  40 mg BID

## 2024-08-22 NOTE — Assessment & Plan Note (Signed)
 Hgb of 9.7 today, down from 11.5 yesterday. - Continue to monitor - Transfusion threshold of 7

## 2024-08-22 NOTE — Anesthesia Preprocedure Evaluation (Addendum)
 Anesthesia Evaluation  Patient identified by MRN, date of birth, ID band Patient awake    Reviewed: Allergy & Precautions, NPO status , Patient's Chart, lab work & pertinent test results  Airway Mallampati: III  TM Distance: >3 FB Neck ROM: Full    Dental  (+) Teeth Intact, Dental Advisory Given   Pulmonary neg pulmonary ROS   Pulmonary exam normal breath sounds clear to auscultation       Cardiovascular hypertension, Pt. on medications (-) angina + Peripheral Vascular Disease  (-) Past MI Normal cardiovascular exam Rhythm:Regular Rate:Normal     Neuro/Psych negative neurological ROS  negative psych ROS   GI/Hepatic Neg liver ROS,GERD  Medicated,,  Endo/Other  diabetes, Type 2, Insulin  Dependent  Class 3 obesity  Renal/GU Renal disease (AKI)     Musculoskeletal negative musculoskeletal ROS (+)    Abdominal  (+) + obese  Peds  Hematology  (+) Blood dyscrasia, anemia   Anesthesia Other Findings Day of surgery medications reviewed with the patient.  Reproductive/Obstetrics                              Anesthesia Physical Anesthesia Plan  ASA: 3  Anesthesia Plan: General   Post-op Pain Management: Regional block*   Induction: Intravenous  PONV Risk Score and Plan: 2 and TIVA, Midazolam , Ondansetron  and Treatment may vary due to age or medical condition  Airway Management Planned: Oral ETT  Additional Equipment:   Intra-op Plan:   Post-operative Plan: Extubation in OR  Informed Consent: I have reviewed the patients History and Physical, chart, labs and discussed the procedure including the risks, benefits and alternatives for the proposed anesthesia with the patient or authorized representative who has indicated his/her understanding and acceptance.     Dental advisory given  Plan Discussed with: CRNA and Anesthesiologist  Anesthesia Plan Comments:           Anesthesia Quick Evaluation

## 2024-08-22 NOTE — Assessment & Plan Note (Signed)
 Na of 132 today, stable from 131 yesterday. - Receiving fluids as above, continue to monitor

## 2024-08-22 NOTE — Inpatient Diabetes Management (Signed)
 Inpatient Diabetes Program Recommendations  AACE/ADA: New Consensus Statement on Inpatient Glycemic Control (2015)  Target Ranges:  Prepandial:   less than 140 mg/dL      Peak postprandial:   less than 180 mg/dL (1-2 hours)      Critically ill patients:  140 - 180 mg/dL   Lab Results  Component Value Date   GLUCAP 219 (H) 08/22/2024   HGBA1C 6.5 (H) 08/22/2024    Review of Glycemic Control  Latest Reference Range & Units 08/21/24 13:10 08/21/24 16:18 08/21/24 21:17 08/22/24 06:13 08/22/24 07:58  Glucose-Capillary 70 - 99 mg/dL 726 (H) 708 (H) 779 (H) 230 (H) 219 (H)   Diabetes history: DM 2 Outpatient Diabetes medications: Basaglar  40 units Daily, Humalog  20 units tid Current orders for Inpatient glycemic control:  Novolog  0-15 units tid  Semglee  15 units Daily A1c 6.5% on 11/4  Inpatient Diabetes Program Recommendations:    -   Increase Semglee  to 20 units (1/2 home dose) -   Add Novolog  hs scale  Thanks,  Clotilda Bull RN, MSN, BC-ADM Inpatient Diabetes Coordinator Team Pager [phone] (8a-5p)

## 2024-08-22 NOTE — Op Note (Signed)
    Patient name: Sean Carter MRN: [id number] DOB: Feb 03, 1971 Sex: male  08/22/2024 Pre-operative Diagnosis: Necrotic left foot Post-operative diagnosis:  Same Surgeon:  Penne C. Sheree, MD Assistant: Curry Damme, PA Procedure Performed: Left below-knee amputation  Indications: 53 year old male with history of right below-knee amputation which is well-healed.  He now has a necrotic left foot and is indicated for left below-knee amputation.  An experienced assistant was necessary to facilitate creation of anterior and posterior flaps as well as ligation of the major neurovascular bundles division of the bones and reapproximation of tissue.  Findings: All tissue did appear healthy however there was significant edema and woody appearance of the skin over the distal leg.  We were able to reapproximate the posterior flap anteriorly with some tension but all tissue appeared viable at completion.   Procedure:  The patient was identified in the holding area and taken to the operating room where he was placed upon operative table and general anesthesia was induced.  He was sterilely prepped and draped in the left lower extremity usual fashion, antibiotics were administered and a timeout was called.  We first exsanguinated the left lower extremity inflated the tourniquet to 250 mmHg above the knee.  We then created 2/3-1/3 incision approximately 12 cm below the tibial tuberosity.  We dissected down first the tibia and the fibula and transected these.  A posterior flap was created with cautery and the amputation knife.  The vessels were clamped and the tourniquet was let down.  The neurovascular bundles were oversewn.  The tibial nerve was pulled on tension tied off and divided high in the wound bed.  The bones were smoothed with rasp.  Hemostasis was obtained with cautery.  The anterior posterior flaps were reapproximated with interrupted 2-0 Vicryl suture and the skin was reapproximated with staples.  A  sterile dressing and ambushield were applied and he was awakened from anesthesia and transferred recovery in stable condition.  All counts were correct at completion.   EBL: 100cc   Winifred Bodiford C. Sheree, MD [facility] Office: [phone] Pager: [phone]

## 2024-08-22 NOTE — Progress Notes (Signed)
  Progress Note    08/22/2024 9:17 AM * Day of Surgery *  Subjective: He states that he really does not feel well and has no appetite  Vitals:   08/22/24 0757 08/22/24 0842  BP: 137/65 135/70  Pulse: 86 82  Resp: 20 15  Temp: 100.1 F (37.8 C) 99.5 F (37.5 C)  SpO2: 91% 91%    Physical Exam: Awake alert and oriented Nonlabored respirations Necrotic left foot Well-healed right below-knee amputation  CBC    Component Value Date/Time   WBC 19.3 (H) 08/22/2024 0448   RBC 3.58 (L) 08/22/2024 0448   HGB 9.7 (L) 08/22/2024 0448   HGB 15.0 05/03/2024 1039   HCT 29.1 (L) 08/22/2024 0448   HCT 45.7 05/03/2024 1039   PLT 347 08/22/2024 0448   PLT 327 05/03/2024 1039   MCV 81.3 08/22/2024 0448   MCV 86 05/03/2024 1039   MCH 27.1 08/22/2024 0448   MCHC 33.3 08/22/2024 0448   RDW 13.3 08/22/2024 0448   RDW 13.3 05/03/2024 1039   LYMPHSABS 0.7 08/21/2024 1308   LYMPHSABS 2.3 05/03/2024 1039   MONOABS 1.1 (H) 08/21/2024 1308   EOSABS 0.0 08/21/2024 1308   EOSABS 0.2 05/03/2024 1039   BASOSABS 0.0 08/21/2024 1308   BASOSABS 0.0 05/03/2024 1039    BMET    Component Value Date/Time   NA 132 (L) 08/22/2024 0448   NA 138 05/03/2024 1039   K 2.7 (LL) 08/22/2024 0448   CL 92 (L) 08/22/2024 0448   CO2 27 08/22/2024 0448   GLUCOSE 245 (H) 08/22/2024 0448   BUN 27 (H) 08/22/2024 0448   BUN 21 05/03/2024 1039   CREATININE 1.19 08/22/2024 0448   CREATININE 0.78 07/10/2020 0910   CALCIUM  8.0 (L) 08/22/2024 0448   GFRNONAA >60 08/22/2024 0448   GFRNONAA 106 07/10/2020 0910   GFRAA 123 07/10/2020 0910    INR    Component Value Date/Time   INR 1.2 08/21/2024 1308     Intake/Output Summary (Last 24 hours) at 08/22/2024 0917 Last data filed at 08/22/2024 0600 Gross per 24 hour  Intake 418.47 ml  Output 700 ml  Net -281.53 ml     Assessment:  53 y.o. male is here with necrotic left foot status post right below-knee amputation  Plan: We discussed proceeding with  left below-knee amputation.  He is in agreement given the appearance of necrotic left foot.  He understands that he will likely require rehab and that his ability to walk will possibly be limited.  He demonstrates good understanding of below-knee amputation and the risk and benefits and consent was signed.   Vertis Scheib C. Sheree, MD [facility] Office: [phone] Pager: [phone]  08/22/2024 9:17 AM

## 2024-08-22 NOTE — Plan of Care (Signed)

## 2024-08-22 NOTE — Progress Notes (Addendum)
 Sepsis 2/2 osteomyelitis and cellulitis of left foot with gangrene. Hx of PAD, s/p right BKA (08/2023), s/p left great toe amputation 2/2 gangrene; T2DM, HTN, HLD, restrictive lung disease, GERD    - S/P Left below-knee amputation, 11/4   08/22/24 1644  TOC Brief Assessment  Insurance and Status Reviewed  Patient has primary care physician Yes  Home environment has been reviewed From home.  Prior level of function: PTA independent with ADL's.  Prior/Current Home Services No current home services  Social Drivers of Health Review SDOH reviewed no interventions necessary  Readmission risk has been reviewed No  Transition of care needs transition of care needs identified, TOC will continue to follow   Resides with sgo, [place]. Pt owns RW, and  prosthesis. PT/OT pending.... Inpatient care management team following for needs. Jon Hoit RN,BSN,CM

## 2024-08-22 NOTE — Progress Notes (Signed)
 PHARMACY - PHYSICIAN COMMUNICATION CRITICAL VALUE ALERT - BLOOD CULTURE IDENTIFICATION (BCID)  EQUAN COGBILL is an 53 y.o. male who presented to [facility] on 08/21/2024 with a chief complaint of necrotic left foot now s/p L BKA  Assessment:   Patient with listed penicillin  allergy (childhood reaction unknown) but has tolerated amoxicillin  and Unasyn  on multiple occasions without issues or reported reactions   Name of physician contacted: FM teaching service Current antibiotics: Vancomycin  and Zosyn    Changes to prescribed antibiotics recommended:  Change antibiotics from Vancomycin /Zosyn  to Unasyn  Recommendations accepted by provider  Results for orders placed or performed during the hospital encounter of 08/21/24  Blood Culture ID Panel (Reflexed) (Collected: 08/21/2024  2:09 PM)  Result Value Ref Range   Enterococcus faecalis NOT DETECTED NOT DETECTED   Enterococcus Faecium NOT DETECTED NOT DETECTED   Listeria monocytogenes NOT DETECTED NOT DETECTED   Staphylococcus species NOT DETECTED NOT DETECTED   Staphylococcus aureus (BCID) NOT DETECTED NOT DETECTED   Staphylococcus epidermidis NOT DETECTED NOT DETECTED   Staphylococcus lugdunensis NOT DETECTED NOT DETECTED   Streptococcus species DETECTED (A) NOT DETECTED   Streptococcus agalactiae DETECTED (A) NOT DETECTED   Streptococcus pneumoniae NOT DETECTED NOT DETECTED   Streptococcus pyogenes NOT DETECTED NOT DETECTED   A.calcoaceticus-baumannii NOT DETECTED NOT DETECTED   Bacteroides fragilis NOT DETECTED NOT DETECTED   Enterobacterales NOT DETECTED NOT DETECTED   Enterobacter cloacae complex NOT DETECTED NOT DETECTED   Escherichia coli NOT DETECTED NOT DETECTED   Klebsiella aerogenes NOT DETECTED NOT DETECTED   Klebsiella oxytoca NOT DETECTED NOT DETECTED   Klebsiella pneumoniae NOT DETECTED NOT DETECTED   Proteus species NOT DETECTED NOT DETECTED   Salmonella species NOT DETECTED NOT DETECTED   Serratia marcescens NOT  DETECTED NOT DETECTED   Haemophilus influenzae NOT DETECTED NOT DETECTED   Neisseria meningitidis NOT DETECTED NOT DETECTED   Pseudomonas aeruginosa NOT DETECTED NOT DETECTED   Stenotrophomonas maltophilia NOT DETECTED NOT DETECTED   Candida albicans NOT DETECTED NOT DETECTED   Candida auris NOT DETECTED NOT DETECTED   Candida glabrata NOT DETECTED NOT DETECTED   Candida krusei NOT DETECTED NOT DETECTED   Candida parapsilosis NOT DETECTED NOT DETECTED   Candida tropicalis NOT DETECTED NOT DETECTED   Cryptococcus neoformans/gattii NOT DETECTED NOT DETECTED    Thank you for allowing pharmacy to be a part of this patient's care.  Shelba Collier, PharmD, BCPS Clinical Pharmacist

## 2024-08-22 NOTE — Assessment & Plan Note (Signed)
 Cr improved to 1.19. Baseline around 1-1.1. Suspect prerenal, rehydrating as above.

## 2024-08-22 NOTE — Anesthesia Procedure Notes (Signed)
 Anesthesia Regional Block: Popliteal block   Pre-Anesthetic Checklist: , timeout performed,  Correct Patient, Correct Site, Correct Laterality,  Correct Procedure, Correct Position, site marked,  Risks and benefits discussed,  Surgical consent,  Pre-op evaluation,  At surgeon's request and post-op pain management  Laterality: Left  Prep: chloraprep       Needles:  Injection technique: Single-shot  Needle Type: Stimiplex     Needle Length: 9cm  Needle Gauge: 21     Additional Needles:   Procedures:,,,, ultrasound used (permanent image in chart),,    Narrative:  Start time: 08/22/2024 9:40 AM End time: 08/22/2024 9:45 AM Injection made incrementally with aspirations every 5 mL.  Performed by: Personally  Anesthesiologist: Cleotilde Butler Dade, MD

## 2024-08-22 NOTE — Transfer of Care (Signed)
 Immediate Anesthesia Transfer of Care Note  Patient: Sean Carter  Procedure(s) Performed: LEFT AMPUTATION BELOW KNEE (Left: Knee)  Patient Location: PACU  Anesthesia Type:General  Level of Consciousness: awake, alert , and oriented  Airway & Oxygen Therapy: Patient Spontanous Breathing and Patient connected to face mask oxygen  Post-op Assessment: Report given to RN and Post -op Vital signs reviewed and stable  Post vital signs: Reviewed and stable  Last Vitals:  Vitals Value Taken Time  BP 104/60 08/22/24 11:35  Temp    Pulse 87 08/22/24 11:38  Resp 16 08/22/24 11:38  SpO2 96 % 08/22/24 11:38  Vitals shown include unfiled device data.  Last Pain:  Vitals:   08/22/24 0842  TempSrc: Oral  PainSc: 7          Complications: No notable events documented.

## 2024-08-22 NOTE — Progress Notes (Signed)
 Daily Progress Note Intern Pager: [phone]  Patient name: Sean Carter Medical record number: [id number] Date of birth: Jul 26, 1971 Age: 52 y.o. Gender: male  Primary Care Provider: Toma Matas, MD Consultants: Vascular surgery Code Status: Full  Pt Overview and Major Events to Date:  - 11/3: Admitted, VVS consulted - 11/4: Left BKA planned  Medical Decision Making:  Sean Carter is a 53 y.o. male with PMH/PSH of PAD s/p right BKA (08/2023), s/p left great toe amputation 2/2 gangrene; T2DM, HTN, HLD, restrictive lung disease, GERD. Admitted for left foot osteomyelitis with sepsis, currently being treated with abx and with VVS on board and planning left BKA today 11/4. Lab abnormalities as below, most notably hypoK (being repleted), but no longer febrile since starting abx. Assessment & Plan Foot osteomyelitis, left ([name]) Sepsis ([name]) Left BKA to be done today 11/4. - Vascular consulted, appreciate recs - Fluids: maintenance with NS at 125 mL/hr - Antibiotics: IV Zosyn  and Vanc (11/3- ), will plan to continue until no growth on culture x48h - s/p one-time IV Unasyn  3 g and IV linezolid 600 mg (11/3) - Pain: PO tylenol  650 mg q8h (scheduled), PRN IV Ketorolac  15 mg q6h (for moderate pain) - Zofran  ODT 4 mg q8h prn for N/V - Holding aspirin  81 mg daily pending vascular recs - Labs: - Blood cultures no growth <24h - AM: BMP, CBC - Fall precautions - PT/OT order post-procedure, start 11/5 AKI (acute kidney injury) Cr improved to 1.19. Baseline around 1-1.1. Suspect prerenal, rehydrating as above. Hypokalemia K of 2.7 this am, repleting with 40 mEq q3h x2. - Repeat K & Mag later today at 1600 - Consider stopping hydrochlorothiazide  on discharge given low K Anemia Hgb of 9.7 today, down from 11.5 yesterday. - Continue to monitor - Transfusion threshold of 7 Hyponatremia Na of 132 today, stable from 131 yesterday. - Receiving fluids as above, continue to  monitor Chronic health problem T2DM: 15u glargine daily with moderate SSI; increase to 20u today (home insulin  glargine 40 units daily, Humalog  20 units TID) HTN: Held home Amlodipine  10 mg daily, hydrochlorothiazide  25 mg daily given normal Bps on admission HLD: Continuing home Atorvastatin  40 mg daily Restrictive Lung Disease: Continuing home montelukast  10 mg daily, albuterol  prn GERD: Continuing home famotidine  40 mg BID  FEN/GI: NPO pending surgery PPx: Lovenox  pending surgery Dispo:Pending PT recommendations  pending BKA.  Subjective:  Reports he feels like death today.  Reports his foot pain is unchanged.  States he has been feeling feverish all night, though on review of labs has not had a fever since yesterday 11/3.  Reports some nausea after taking his potassium supplement this morning, but no vomiting. Does not report other specific symptoms.   Objective: Temp:  [98.2 F (36.8 C)-102.2 F (39 C)] 99.5 F (37.5 C) (11/04 0842) Pulse Rate:  [82-108] 82 (11/04 0842) Resp:  [15-20] 15 (11/04 0842) BP: (119-138)/(59-76) 135/70 (11/04 0842) SpO2:  [86 %-100 %] 91 % (11/04 0842) Weight:  [99.8 kg] 99.8 kg (11/03 1247) Physical Exam: General: Patient lying back in bed, no acute distress. Cardiovascular: Regular rate and rhythm, no murmurs/rubs/gallops. Respiratory: Normal work of breathing on room air. Clear to auscultation bilaterally; no wheezes, crackles. Abdomen: Bowel sounds present and normoactive bilaterally. Soft, nondistended, nontender. Extremities: S/p right BKA.  Left foot necrotic, unchanged in appearance from prior assessment.  Laboratory: Most recent CBC Lab Results  Component Value Date   WBC 19.3 (H) 08/22/2024   HGB  9.7 (L) 08/22/2024   HCT 29.1 (L) 08/22/2024   MCV 81.3 08/22/2024   PLT 347 08/22/2024   Most recent BMP    Latest Ref Rng & Units 08/22/2024    4:48 AM  BMP  Glucose 70 - 99 mg/dL 754   BUN 6 - 20 mg/dL 27   Creatinine 9.38 - 1.24  mg/dL 8.80   Sodium 864 - 854 mmol/L 132   Potassium 3.5 - 5.1 mmol/L 2.7   Chloride 98 - 111 mmol/L 92   CO2 22 - 32 mmol/L 27   Calcium  8.9 - 10.3 mg/dL 8.0    J8r: 6.5   Larraine Palma, MD 08/22/2024, 9:03 AM  PGY-1, [facility] FPTS Intern pager: [phone], text pages welcome Secure chat group [facility] Teaching Service

## 2024-08-22 NOTE — Progress Notes (Signed)
 Orthopedic Tech Progress Note Patient Details:  Sean Carter [birth year] [id number]  PACU RN stated patient has VIVE PROTOCOL BKA   [name], male   DOB: 11-Mar-1971, 53 y.o.   MRN: [id number]  Sean Carter Pac 08/22/2024, 11:59 AM

## 2024-08-22 NOTE — Anesthesia Procedure Notes (Signed)
 Procedure Name: Intubation Date/Time: 08/22/2024 10:03 AM  Performed by: Delores Dus, CRNAPre-anesthesia Checklist: Patient identified, Emergency Drugs available, Suction available and Patient being monitored Patient Re-evaluated:Patient Re-evaluated prior to induction Oxygen Delivery Method: Circle system utilized Preoxygenation: Pre-oxygenation with 100% oxygen Induction Type: IV induction Ventilation: Oral airway inserted - appropriate to patient size and Two handed mask ventilation required Laryngoscope Size: Glidescope Grade View: Grade I Tube type: Oral Tube size: 7.5 mm Number of attempts: 4 Airway Equipment and Method: Stylet and Oral airway Placement Confirmation: ETT inserted through vocal cords under direct vision, positive ETCO2 and breath sounds checked- equal and bilateral Secured at: 23 cm Tube secured with: Tape Dental Injury: Teeth and Oropharynx as per pre-operative assessment  Comments: 4 attempted, 2 with Mac 4 with SRNA and MDA, 1 with Cleotilde 2 CRNA with grade 4 view. 1 with Glidescope MAC 4 blade with SRNA, grade 1 view.

## 2024-08-22 NOTE — Assessment & Plan Note (Addendum)
 Left BKA to be done today 11/4. - Vascular consulted, appreciate recs - Fluids: maintenance with NS at 125 mL/hr - Antibiotics: IV Zosyn  and Vanc (11/3- ), will plan to continue until no growth on culture x48h - s/p one-time IV Unasyn  3 g and IV linezolid 600 mg (11/3) - Pain: PO tylenol  650 mg q8h (scheduled), PRN IV Ketorolac  15 mg q6h (for moderate pain) - Zofran  ODT 4 mg q8h prn for N/V - Holding aspirin  81 mg daily pending vascular recs - Labs: - Blood cultures no growth <24h - AM: BMP, CBC - Fall precautions - PT/OT order post-procedure, start 11/5

## 2024-08-22 NOTE — Consult Note (Addendum)
 WOC Nurse Consult Note: Reason for Consult: R stump wound  Wound type: full thickness R stump prior BKA 08/2023 2 separate areas 1 distal and one superior aspect  Pressure Injury POA: NA  Measurement: see nursing flowsheet  Wound bed: pink moist superior; distal aspect 50% pink 50% tan dry appearing  Drainage (amount, consistency, odor) see nursing flowsheet  Periwound: intact, dry hyperkeratotic skin  Dressing procedure/placement/frequency: Cleanse R stump wounds with Vashe, allow to air dry.  Apply a small piece of silver hydrofiber Soila [phone]) cut to fit wound beds daily and secure with silicone foam or dry gauze and Kerlix roll gauze whichever is preferred.   POC discussed with bedside nurse. WOC team will not follow. Re-consult if further needs arise.   Thank you,    Powell Bar MSN, RN-BC, [employer]

## 2024-08-22 NOTE — Anesthesia Procedure Notes (Signed)
 Anesthesia Regional Block: Adductor canal block   Pre-Anesthetic Checklist: , timeout performed,  Correct Patient, Correct Site, Correct Laterality,  Correct Procedure, Correct Position, site marked,  Risks and benefits discussed,  Surgical consent,  Pre-op evaluation,  At surgeon's request and post-op pain management  Laterality: Left  Prep: chloraprep       Needles:  Injection technique: Single-shot  Needle Type: Stimiplex     Needle Length: 9cm  Needle Gauge: 21     Additional Needles:   Procedures:,,,, ultrasound used (permanent image in chart),,    Narrative:  Start time: 08/22/2024 9:39 AM End time: 08/22/2024 9:44 AM Injection made incrementally with aspirations every 5 mL.  Performed by: Personally  Anesthesiologist: Cleotilde Butler Dade, MD

## 2024-08-22 NOTE — Plan of Care (Signed)
 FMTS Interim Progress Note  S: Patient was seen following left BKA.  Reports he is feeling well after this procedure, not currently in any pain.  No chest pain, no shortness of breath, no palpitations, no N/V.  Reports he is passing gas, no bowel movement yet.  O: BP 106/62   Pulse 72   Temp (!) 100.9 F (38.3 C)   Resp 20   Ht 5' 7 (1.702 m)   Wt 99.8 kg   SpO2 94%   BMI 34.46 kg/m    General: Patient lying back in bed, no acute distress Cardiovascular: Regular rate and rhythm, no murmurs/rubs/gallops. Respiratory: Normal work of breathing on 2 L O2 via [place]. Clear to auscultation bilaterally from front; no wheezes, crackles. Abdomen: Bowel sounds present and normoactive bilaterally. Soft, nondistended, nontender. Extremity: S/p right BKA.  Site of left BKA wrapped and in protective shell, no drainage seen through bandage.  A/P: Osteomyelitis s/p left BKA - Antibiotics: IV Zosyn  and Vanc (11/3- )  - Pain regimen: Acetaminophen  650 mg every 8 hour with additional 325-650 mg every 6 hours as needed for mild pain; hydrocodone-acetaminophen  5 or 7.5-325 1-2 tablets every 4 hours as needed; ketorolac  15 mg IV every 6 hours as needed; feeding 0.5-1 mg IV every 2 hours as needed - Bowel regimen: Start MiraLAX , senna daily - Repeat K and mag pending today for 1600 - Vascular recs pending postop - Surgical path pending - Otherwise, continue plan per HPI  Larraine Palma, MD 08/22/2024, 1:23 PM PGY-1, [facility] Family Medicine Service pager [phone]

## 2024-08-22 NOTE — Plan of Care (Signed)
  Problem: Education: Goal: Ability to describe self-care measures that may prevent or decrease complications (Diabetes Survival Skills Education) will improve Outcome: Progressing Goal: Individualized Educational Video(s) Outcome: Progressing   Problem: Coping: Goal: Ability to adjust to condition or change in health will improve Outcome: Progressing   Problem: Fluid Volume: Goal: Ability to maintain a balanced intake and output will improve Outcome: Progressing   Problem: Health Behavior/Discharge Planning: Goal: Ability to identify and utilize available resources and services will improve Outcome: Progressing Goal: Ability to manage health-related needs will improve Outcome: Progressing   Problem: Metabolic: Goal: Ability to maintain appropriate glucose levels will improve Outcome: Progressing   Problem: Nutritional: Goal: Maintenance of adequate nutrition will improve Outcome: Progressing Goal: Progress toward achieving an optimal weight will improve Outcome: Progressing   Problem: Skin Integrity: Goal: Risk for impaired skin integrity will decrease Outcome: Progressing   Problem: Tissue Perfusion: Goal: Adequacy of tissue perfusion will improve Outcome: Progressing   Problem: Education: Goal: Knowledge of General Education information will improve Description: Including pain rating scale, medication(s)/side effects and non-pharmacologic comfort measures Outcome: Progressing   Problem: Health Behavior/Discharge Planning: Goal: Ability to manage health-related needs will improve Outcome: Progressing   Problem: Clinical Measurements: Goal: Ability to maintain clinical measurements within normal limits will improve Outcome: Progressing Goal: Will remain free from infection Outcome: Progressing Goal: Diagnostic test results will improve Outcome: Progressing Goal: Respiratory complications will improve Outcome: Progressing Goal: Cardiovascular complication will  be avoided Outcome: Progressing   Problem: Activity: Goal: Risk for activity intolerance will decrease Outcome: Progressing   Problem: Nutrition: Goal: Adequate nutrition will be maintained Outcome: Progressing   Problem: Coping: Goal: Level of anxiety will decrease Outcome: Progressing   Problem: Elimination: Goal: Will not experience complications related to bowel motility Outcome: Progressing Goal: Will not experience complications related to urinary retention Outcome: Progressing   Problem: Pain Managment: Goal: General experience of comfort will improve and/or be controlled Outcome: Progressing   Problem: Safety: Goal: Ability to remain free from injury will improve Outcome: Progressing   Problem: Skin Integrity: Goal: Risk for impaired skin integrity will decrease Outcome: Progressing   Problem: Education: Goal: Knowledge of the prescribed therapeutic regimen will improve Outcome: Progressing Goal: Ability to verbalize activity precautions or restrictions will improve Outcome: Progressing Goal: Understanding of discharge needs will improve Outcome: Progressing   Problem: Activity: Goal: Ability to perform//tolerate increased activity and mobilize with assistive devices will improve Outcome: Progressing   Problem: Clinical Measurements: Goal: Postoperative complications will be avoided or minimized Outcome: Progressing   Problem: Self-Care: Goal: Ability to meet self-care needs will improve Outcome: Progressing   Problem: Self-Concept: Goal: Ability to maintain and perform role responsibilities to the fullest extent possible will improve Outcome: Progressing   Problem: Pain Management: Goal: Pain level will decrease with appropriate interventions Outcome: Progressing   Problem: Skin Integrity: Goal: Demonstration of wound healing without infection will improve Outcome: Progressing

## 2024-08-22 NOTE — Assessment & Plan Note (Addendum)
 K of 2.7 this am, repleting with 40 mEq q3h x2. - Repeat K & Mag later today at 1600 - Consider stopping hydrochlorothiazide  on discharge given low K

## 2024-08-23 ENCOUNTER — Inpatient Hospital Stay (HOSPITAL_COMMUNITY)

## 2024-08-23 ENCOUNTER — Encounter (HOSPITAL_COMMUNITY): Payer: Self-pay | Admitting: Vascular Surgery

## 2024-08-23 DIAGNOSIS — R7881 Bacteremia: Secondary | ICD-10-CM

## 2024-08-23 DIAGNOSIS — A401 Sepsis due to streptococcus, group B: Secondary | ICD-10-CM | POA: Diagnosis not present

## 2024-08-23 DIAGNOSIS — Z89511 Acquired absence of right leg below knee: Secondary | ICD-10-CM | POA: Diagnosis not present

## 2024-08-23 DIAGNOSIS — B951 Streptococcus, group B, as the cause of diseases classified elsewhere: Secondary | ICD-10-CM | POA: Diagnosis not present

## 2024-08-23 DIAGNOSIS — Z89512 Acquired absence of left leg below knee: Secondary | ICD-10-CM | POA: Diagnosis not present

## 2024-08-23 DIAGNOSIS — M47814 Spondylosis without myelopathy or radiculopathy, thoracic region: Secondary | ICD-10-CM | POA: Diagnosis not present

## 2024-08-23 DIAGNOSIS — M549 Dorsalgia, unspecified: Secondary | ICD-10-CM | POA: Diagnosis not present

## 2024-08-23 DIAGNOSIS — Z4889 Encounter for other specified surgical aftercare: Secondary | ICD-10-CM

## 2024-08-23 DIAGNOSIS — M5124 Other intervertebral disc displacement, thoracic region: Secondary | ICD-10-CM | POA: Diagnosis not present

## 2024-08-23 LAB — CBC
HCT: 29.3 % — ABNORMAL LOW (ref 39.0–52.0)
Hemoglobin: 9.5 g/dL — ABNORMAL LOW (ref 13.0–17.0)
MCH: 26.8 pg (ref 26.0–34.0)
MCHC: 32.4 g/dL (ref 30.0–36.0)
MCV: 82.8 fL (ref 80.0–100.0)
Platelets: 284 K/uL (ref 150–400)
RBC: 3.54 MIL/uL — ABNORMAL LOW (ref 4.22–5.81)
RDW: 13.8 % (ref 11.5–15.5)
WBC: 11.5 K/uL — ABNORMAL HIGH (ref 4.0–10.5)
nRBC: 0 % (ref 0.0–0.2)

## 2024-08-23 LAB — GLUCOSE, CAPILLARY
Glucose-Capillary: 219 mg/dL — ABNORMAL HIGH (ref 70–99)
Glucose-Capillary: 219 mg/dL — ABNORMAL HIGH (ref 70–99)
Glucose-Capillary: 199 mg/dL — ABNORMAL HIGH (ref 70–99)
Glucose-Capillary: 130 mg/dL — ABNORMAL HIGH (ref 70–99)
Glucose-Capillary: 111 mg/dL — ABNORMAL HIGH (ref 70–99)

## 2024-08-23 LAB — SEDIMENTATION RATE: Sed Rate: 122 mm/h — ABNORMAL HIGH (ref 0–16)

## 2024-08-23 LAB — BASIC METABOLIC PANEL WITH GFR
Anion gap: 10 (ref 5–15)
BUN: 25 mg/dL — ABNORMAL HIGH (ref 6–20)
CO2: 27 mmol/L (ref 22–32)
Calcium: 7.9 mg/dL — ABNORMAL LOW (ref 8.9–10.3)
Chloride: 96 mmol/L — ABNORMAL LOW (ref 98–111)
Creatinine, Ser: 1.16 mg/dL (ref 0.61–1.24)
GFR, Estimated: >60 mL/min (ref >60)
Glucose, Bld: 255 mg/dL — ABNORMAL HIGH (ref 70–99)
Potassium: 3.5 mmol/L (ref 3.5–5.1)
Sodium: 133 mmol/L — ABNORMAL LOW (ref 135–145)

## 2024-08-23 LAB — ECHOCARDIOGRAM COMPLETE
Area-P 1/2: 6.02 cm2
Height: 67 in
S' Lateral: 2.8 cm
Weight: 3520 [oz_av]

## 2024-08-23 LAB — MAGNESIUM: Magnesium: 2.2 mg/dL (ref 1.7–2.4)

## 2024-08-23 LAB — C-REACTIVE PROTEIN: CRP: 31.3 mg/dL — ABNORMAL HIGH (ref <1.0)

## 2024-08-23 MED ORDER — LORAZEPAM 0.5 MG PO TABS
0.5000 mg | ORAL_TABLET | Freq: Once | ORAL | Status: DC
Start: 2024-08-23 — End: 2024-08-24
  Filled 2024-08-23: qty 1

## 2024-08-23 MED ORDER — INSULIN ASPART 100 UNIT/ML IJ SOLN
0.0000 [IU] | Freq: Three times a day (TID) | INTRAMUSCULAR | Status: DC
  Administered 2024-08-23: 3 [IU] via SUBCUTANEOUS
  Filled 2024-08-23: qty 3

## 2024-08-23 MED ORDER — INSULIN GLARGINE-YFGN 100 UNIT/ML ~~LOC~~ SOLN
25.0000 [IU] | Freq: Every day | SUBCUTANEOUS | Status: DC
  Administered 2024-08-23: 25 [IU] via SUBCUTANEOUS
  Filled 2024-08-23 (×2): qty 0.25

## 2024-08-23 MED ORDER — PENICILLIN G POTASSIUM 20000000 UNITS IJ SOLR
24.0000 10*6.[IU] | INTRAVENOUS | Status: DC
  Administered 2024-08-23 – 2024-08-24 (×2): 24 10*6.[IU] via INTRAVENOUS
  Filled 2024-08-23 (×2): qty 20

## 2024-08-23 MED ORDER — INSULIN ASPART 100 UNIT/ML IJ SOLN
0.0000 [IU] | Freq: Three times a day (TID) | INTRAMUSCULAR | Status: DC
  Administered 2024-08-23: 2 [IU] via SUBCUTANEOUS
  Administered 2024-08-24: 5 [IU] via SUBCUTANEOUS
  Administered 2024-08-24 (×2): 3 [IU] via SUBCUTANEOUS
  Filled 2024-08-23: qty 3
  Filled 2024-08-23: qty 2
  Filled 2024-08-23: qty 3

## 2024-08-23 MED ORDER — ENOXAPARIN SODIUM 40 MG/0.4ML IJ SOSY
40.0000 mg | PREFILLED_SYRINGE | INTRAMUSCULAR | Status: DC
  Administered 2024-08-23 – 2024-08-24 (×2): 40 mg via SUBCUTANEOUS
  Filled 2024-08-23 (×2): qty 0.4

## 2024-08-23 MED ORDER — INSULIN ASPART 100 UNIT/ML IJ SOLN: 0.0000 [IU] | Freq: Every day | INTRAMUSCULAR | Status: DC

## 2024-08-23 MED ORDER — ASPIRIN 81 MG PO TBEC
81.0000 mg | DELAYED_RELEASE_TABLET | Freq: Every day | ORAL | Status: DC
  Administered 2024-08-23 – 2024-08-24 (×2): 81 mg via ORAL
  Filled 2024-08-23 (×2): qty 1

## 2024-08-23 NOTE — Progress Notes (Signed)
 Daily Progress Note Intern Pager: [phone]  Patient name: Sean Carter Medical record number: [id number] Date of birth: 31-May-1971 Age: 53 y.o. Gender: male  Primary Care Provider: Toma Matas, MD Consultants: Vasc Surg, ID Code Status: Full  Pt Overview and Major Events to Date:  - 11/3: Admitted, VVS consulted - 11/4: Left BKA performed; blood culture growing strep agalactiae - 11/5: ID consulted re: abx  Medical Decision Making:  Sean Carter is a 53 y.o. male with PMH/PSH of PAD s/p right BKA (08/2023), s/p left great toe amputation 2/2 gangrene; T2DM, HTN, HLD, restrictive lung disease, GERD. Admitted for left foot osteomyelitis with sepsis, s/p left BKA 11/4. Blood cultures did grow streptococcus agalactiae, currently being treated with IV antibiotics. White count has continued to improve s/p amputation and abx.  Still with significant leg pain. Assessment & Plan Sepsis due to Streptococcus agalactiae ([name]) Foot osteomyelitis, left s/p BKA ([name]) - Vascular surgery following, appreciate recs - Blood cultures growing streptococcus agalactiae - ID consulted re: abx regimen - Continue current abx regimen plus start Penicillin  G - Getting repeat blood cultures - Getting TTE - Getting MRI of thoracic and lumbar spine - Antibiotics: IV Unasyn  (11/4- ), IV Penicillin  G (11/5- ) - IV Zosyn  and Vanc (11/3-11/4) - IV Unasyn  3 g and IV linezolid 600 mg (11/3) - Pain regimen: - Acetaminophen  650 mg every 8 hour with additional 325-650 mg every 6 hours as needed for mild pain -> stop prn tylenol  given other tyenol - Hydrocodone-acetaminophen  5 or 7.5-325 1-2 tablets every 4 hours as needed - Ketorolac  15 mg IV every 6 hours as needed - Morphine 0.5-1 mg IV every 2 hours as needed -> discontinue as pt will not take - Bowel regimen: Holding Miralax , senna in setting of loose stools - Zofran  ODT 4 mg q8h prn for N/V - Restarting aspirin  81 mg per vasc surg (Dr. Sheree)  recommendations - AM Labs: CBC, CMP - Fall precautions - PT/OT eval and treat Hypokalemia K of 3.5 this morning. - Continue to follow - Consider stopping hydrochlorothiazide  on discharge given low K during hospitalization Anemia Stable Hgb of 9.5 today from 9.7 yesterday. - Continue to monitor - Transfusion threshold of 7 Hyponatremia Na of 133 today, stable. - Continue to monitor AKI (acute kidney injury) (Resolved: 08/23/2024) Cr normalized. Continued po intake encouraged. Chronic health problem T2DM: 20u glargine daily with moderate SSI; increase to 25u daily and add QHS (home insulin  glargine 40 units daily, Humalog  20 units TID) HTN: Held home Amlodipine  10 mg daily, hydrochlorothiazide  25 mg daily; consider adding back amlodipine  if BP remains elevated HLD: Continuing home Atorvastatin  40 mg daily Restrictive Lung Disease: Continuing home montelukast  10 mg daily, albuterol  prn GERD: Continuing home famotidine  40 mg BID   FEN/GI: Carb modified PPx: Lovenox  Dispo: Pending PT recommendations .  Subjective:  Patient reports left leg pain began around 3-4 this morning, was an 11 out of 10 in severity and received hydrocodone acetaminophen  for this.  Reports this did help his pain, though he continues to have significant pain currently.  Does not want to take morphine, reports he has taken tramadol  before in addition to hydrocodone/acetaminophen .  Denies further diarrhea this morning, no other concerns.  Objective: Temp:  [99.2 F (37.3 C)-100.9 F (38.3 C)] 99.2 F (37.3 C) (11/05 0400) Pulse Rate:  [72-91] 74 (11/05 0400) Resp:  [15-23] 17 (11/05 0400) BP: (103-143)/(55-84) 143/73 (11/05 0400) SpO2:  [91 %-97 %] 95 % (11/05  0400) Physical Exam: General: Patient lying back in bed, no acute distress Cardiovascular: Regular rate and rhythm, no murmurs/rubs/gallops. Respiratory: Normal work of breathing on room air. Clear to auscultation bilaterally; no wheezes,  crackles. Abdomen: Bowel sounds present and normoactive bilaterally. Soft, nondistended, nontender. Extremities: S/p right BKA, bandage at end of stump. S/p left BKA, leg wrapped, no drainage visible through wrap.  Laboratory: Most recent CBC Lab Results  Component Value Date   WBC 11.5 (H) 08/23/2024   HGB 9.5 (L) 08/23/2024   HCT 29.3 (L) 08/23/2024   MCV 82.8 08/23/2024   PLT 284 08/23/2024   Most recent BMP    Latest Ref Rng & Units 08/23/2024    3:11 AM  BMP  Glucose 70 - 99 mg/dL 744   BUN 6 - 20 mg/dL 25   Creatinine 9.38 - 1.24 mg/dL 8.83   Sodium 864 - 854 mmol/L 133   Potassium 3.5 - 5.1 mmol/L 3.5   Chloride 98 - 111 mmol/L 96   CO2 22 - 32 mmol/L 27   Calcium  8.9 - 10.3 mg/dL 7.9   Mag: 2.2   Sean Palma, MD 08/23/2024, 7:25 AM  PGY-1, [facility] Family Medicine FPTS Intern pager: [phone], text pages welcome Secure chat group [facility] Teaching Service

## 2024-08-23 NOTE — Evaluation (Signed)
 Occupational Therapy Evaluation Patient Details Name: Sean Carter MRN: [id number] DOB: 20-Dec-1970 Today's Date: 08/23/2024   History of Present Illness   Pt is 53 y.o. male admitted 08/21/24 for sepsis and left foot infection with gangrenous changes and malodorous. X-ray of L foot demonstrated possible osteomyelitis of the medial cortex of the left navicular bone with overlying soft tissue gas. Pt s/p L BKA 11/4. PMHx: T2DM, PAD, s/p L great toe amputation, s/p R 1st and 2nd partial ray amputation, s/p R TMA, s/p R BKA, HTN, HLD, restrictive lung disease, and GERD     Clinical Impressions Pt was independent, walking without AD and his R LE prosthesis prior to admission. He works as a investment banker, operational for [employer] and had just resumed driving with adaptations. Pt is highly motivated to return to independence. He demonstrates bed mobility and lateral transfer to drop arm commode in the absence of his prosthesis with CGA. He requires set up to min assist for ADLs. Pt has excellent potential to return to modified independence with intensive rehab. Patient will benefit from intensive inpatient follow-up therapy, >3 hours/day.     If plan is discharge home, recommend the following:   A little help with walking and/or transfers;A little help with bathing/dressing/bathroom;Assistance with cooking/housework;Assist for transportation;Help with stairs or ramp for entrance     Functional Status Assessment   Patient has had a recent decline in their functional status and demonstrates the ability to make significant improvements in function in a reasonable and predictable amount of time.     Equipment Recommendations    (drop arm BSC)     Recommendations for Other Services   Rehab consult     Precautions/Restrictions   Precautions Precautions: Fall Recall of Precautions/Restrictions: Intact Required Braces or Orthoses: Other Brace Other Brace: L limb protector Restrictions Weight Bearing  Restrictions Per Provider Order: Yes LLE Weight Bearing Per Provider Order: Non weight bearing     Mobility Bed Mobility Overal bed mobility: Needs Assistance Bed Mobility: Supine to Sit     Supine to sit: Contact guard     General bed mobility comments: + use of rail    Transfers Overall transfer level: Needs assistance Equipment used: None Transfers: Bed to chair/wheelchair/BSC            Lateral/Scoot Transfers: Contact guard assist General transfer comment: lateral scoot in absence of R LE prosthesis      Balance Overall balance assessment: Needs assistance   Sitting balance-Leahy Scale: Fair                                     ADL either performed or assessed with clinical judgement   ADL Overall ADL's : Needs assistance/impaired Eating/Feeding: Independent;Sitting   Grooming: Set up;Sitting   Upper Body Bathing: Set up;Sitting   Lower Body Bathing: Minimal assistance;Sitting/lateral leans   Upper Body Dressing : Set up;Sitting   Lower Body Dressing: Minimal assistance;Sitting/lateral leans   Toilet Transfer: Contact guard assist;Transfer board                   Vision Ability to See in Adequate Light: 0 Adequate Patient Visual Report: No change from baseline       Perception         Praxis         Pertinent Vitals/Pain Pain Assessment Pain Assessment: 0-10 Pain Score: 8  Pain Location: L residual limb Pain Descriptors /  Indicators: Operative site guarding, Grimacing Pain Intervention(s): Monitored during session, Repositioned, Premedicated before session     Extremity/Trunk Assessment Upper Extremity Assessment Upper Extremity Assessment: Overall WFL for tasks assessed   Lower Extremity Assessment Lower Extremity Assessment: Defer to PT evaluation   Cervical / Trunk Assessment Cervical / Trunk Assessment: Normal   Communication Communication Communication: No apparent difficulties   Cognition Arousal:  Alert Behavior During Therapy: WFL for tasks assessed/performed, Anxious Cognition: No apparent impairments                               Following commands: Intact       Cueing  General Comments   Cueing Techniques: Verbal cues      Exercises     Shoulder Instructions      Home Living Family/patient expects to be discharged to:: Private residence Living Arrangements: Spouse/significant other Available Help at Discharge: Family;Available 24 hours/day Type of Home: House (townhome) Home Access: Level entry     Home Layout: Two level;1/2 bath on main level;Bed/bath upstairs Alternate Level Stairs-Number of Steps: 16 Alternate Level Stairs-Rails: Right Bathroom Shower/Tub: Walk-in shower;Tub/shower unit   Bathroom Toilet: Standard Bathroom Accessibility: No   Home Equipment: Scientist, Research (medical) (4 wheels);Grab bars - tub/shower;Shower seat - built in          Prior Functioning/Environment Prior Level of Function : Independent/Modified Independent;Driving;Working/employed             Mobility Comments: Ambulates without AD but using R BKA prosthesis. 1 fall at work. ADLs Comments: Indep with ADLs/IADLs. He was working as a investment banker, operational, typically ~90hrs/wk. Now transitioned to [place] and will only work a max of ~50hrs/wk. Pt reports he was just begining to learn how to drive an adapted vehicle.    OT Problem List: Decreased strength;Impaired balance (sitting and/or standing);Pain   OT Treatment/Interventions: Self-care/ADL training;DME and/or AE instruction;Therapeutic activities;Patient/family education;Balance training      OT Goals(Current goals can be found in the care plan section)   Acute Rehab OT Goals OT Goal Formulation: With patient Time For Goal Achievement: 09/06/24 Potential to Achieve Goals: Good ADL Goals Pt Will Perform Lower Body Bathing: with modified independence;sitting/lateral leans Pt Will Perform Lower Body Dressing:  with modified independence;sitting/lateral leans Pt Will Transfer to Toilet: with supervision;ambulating;bedside commode Pt Will Perform Toileting - Clothing Manipulation and hygiene: with modified independence;sitting/lateral leans   OT Frequency:  Min 2X/week    Co-evaluation              AM-PAC OT 6 Clicks Daily Activity     Outcome Measure Help from another person eating meals?: None Help from another person taking care of personal grooming?: A Little Help from another person toileting, which includes using toliet, bedpan, or urinal?: A Little Help from another person bathing (including washing, rinsing, drying)?: A Little Help from another person to put on and taking off regular upper body clothing?: A Little Help from another person to put on and taking off regular lower body clothing?: A Little 6 Click Score: 19   End of Session    Activity Tolerance: Patient tolerated treatment well Patient left: in chair;with call bell/phone within reach;with chair alarm set  OT Visit Diagnosis: Pain;Muscle weakness (generalized) (M62.81)                Time: 9047-8983 OT Time Calculation (min): 24 min Charges:  OT General Charges $OT Visit: 1 Visit OT Evaluation $OT Eval  Moderate Complexity: 1 Mod  Mliss HERO, OTR/L Acute Rehabilitation Services Office: [phone]   Kennth Mliss Helling 08/23/2024, 11:47 AM

## 2024-08-23 NOTE — Evaluation (Signed)
 Physical Therapy Evaluation Patient Details Name: Sean Carter MRN: [id number] DOB: [birth year] Today's Date: 08/23/2024  History of Present Illness  Pt is 53 y.o. male admitted 08/21/24 for sepsis and left foot infection with gangrenous changes and malodorous. X-ray of L foot demonstrated possible osteomyelitis of the medial cortex of the left navicular bone with overlying soft tissue gas. Pt s/p L BKA 11/4. PMHx: T2DM, PAD, s/p L great toe amputation, s/p R 1st and 2nd partial ray amputation, s/p R TMA, s/p R BKA, HTN, HLD, restrictive lung disease, and GERD.   Clinical Impression  Pt admitted with above diagnosis. PTA, pt was independent for functional mobility using R BKA prosthesis, independent with ADLs/IADLs, and working as a investment banker, operational. He lives with his wife in a two story townhouse with a level entry and 16 steps to the bedroom and full bathroom. Pt currently with functional limitations due to the deficits listed below (see PT Problem List). He required CGA for bed mobility with heavy reliance on bed rails and CGA for bed>chair lateral scoot transfer. Further OOB mobility was limited by the absence of his prosthesis. He is highly motivated to regain his PLOF. Pt will benefit from acute skilled PT to increase his independence and safety with mobility to allow discharge. Recommend intensive inpatient follow-up therapy, >3 hours/day.    If plan is discharge home, recommend the following: A little help with walking and/or transfers;A little help with bathing/dressing/bathroom;Assistance with cooking/housework;Assist for transportation;Help with stairs or ramp for entrance   Can travel by private vehicle        Equipment Recommendations Hospital bed;Rolling walker (2 wheels);BSC/3in1  Recommendations for Other Services  Rehab consult    Functional Status Assessment Patient has had a recent decline in their functional status and demonstrates the ability to make significant improvements in  function in a reasonable and predictable amount of time.     Precautions / Restrictions Precautions Precautions: Fall Recall of Precautions/Restrictions: Intact Required Braces or Orthoses: Other Brace Other Brace: L limb protector (Pt declined to wear d/t discomfort and tenderness on residual limb) Restrictions Weight Bearing Restrictions Per Provider Order: Yes LLE Weight Bearing Per Provider Order: Non weight bearing      Mobility  Bed Mobility Overal bed mobility: Needs Assistance Bed Mobility: Supine to Sit     Supine to sit: Contact guard, Used rails, HOB elevated     General bed mobility comments: Pt pulled himself into long sitting using bilateral side rails on bed. Lowered rail in order for pt to pivot to EOB. He continued to use BUE support on railing.    Transfers Overall transfer level: Needs assistance Equipment used: None Transfers: Bed to chair/wheelchair/BSC            Lateral/Scoot Transfers: Contact guard assist General transfer comment: Pt transferred to drop arm recliner chair on his left. Educated pt on head-hips relationship. Cued pt on sequencing including lift, shift, lower. He was able to slowly scoot over to chair. CGA for safety.    Ambulation/Gait               General Gait Details: Deferred d/t absence of R BKA prosthesis.  Stairs            Wheelchair Mobility     Tilt Bed    Modified Rankin (Stroke Patients Only)       Balance Overall balance assessment: Needs assistance Sitting-balance support: Bilateral upper extremity supported, Feet unsupported Sitting balance-Leahy Scale: Fair Sitting balance - Comments: Pt  sat EOB with close supervision and engaged in scooting with CGA.                                     Pertinent Vitals/Pain Pain Assessment Pain Assessment: 0-10 Pain Score: 8  Pain Location: L residual limb Pain Descriptors / Indicators: Operative site guarding, Grimacing Pain  Intervention(s): Premedicated before session, Monitored during session, Repositioned    Home Living Family/patient expects to be discharged to:: Private residence Living Arrangements: Spouse/significant other Available Help at Discharge: Family;Available 24 hours/day Type of Home: House (townhome) Home Access: Level entry     Alternate Level Stairs-Number of Steps: 16 Home Layout: Two level;1/2 bath on main level;Bed/bath upstairs Home Equipment: Wheelchair - manual;Rollator (4 wheels);Grab bars - tub/shower;Shower seat - built in      Prior Function Prior Level of Function : Independent/Modified Independent;Driving;Working/employed             Mobility Comments: Ambulates without AD but using R BKA prosthesis. 1 fall at work. ADLs Comments: Indep with ADLs/IADLs. He was working as a investment banker, operational, typically ~90hrs/wk. Now transitioned to [place] and will only work a max of ~50hrs/wk. Pt reports he was just begining to learn how to drive an adapted vehicle.     Extremity/Trunk Assessment   Upper Extremity Assessment Upper Extremity Assessment: Defer to OT evaluation    Lower Extremity Assessment Lower Extremity Assessment: LLE deficits/detail LLE Deficits / Details: Pt POD 1 s/p BKA. Decreased hip and knee AROM. Grossly 3/5 strength. LLE: Unable to fully assess due to pain LLE Sensation: decreased proprioception LLE Coordination: decreased gross motor    Cervical / Trunk Assessment Cervical / Trunk Assessment: Normal  Communication   Communication Communication: No apparent difficulties    Cognition Arousal: Alert Behavior During Therapy: WFL for tasks assessed/performed, Anxious   PT - Cognitive impairments: No apparent impairments                       PT - Cognition Comments: Pt A,Ox4. He was initially nervous to attempt mobility d/t pain. Following commands: Intact       Cueing Cueing Techniques: Verbal cues     General Comments General comments (skin  integrity, edema, etc.): VSS on RA.    Exercises Other Exercises Other Exercises: Educated pt on L BKA HEP including quad sets, glute sets, hip abd/add, towel squeeze, LAQ, SLR, and chair push up. He declined handout as he remembers the exercises from his last procedure on the right side.   Assessment/Plan    PT Assessment Patient needs continued PT services  PT Problem List Decreased strength;Decreased activity tolerance;Decreased balance;Decreased mobility;Decreased safety awareness       PT Treatment Interventions DME instruction;Gait training;Stair training;Functional mobility training;Therapeutic activities;Therapeutic exercise;Balance training;Patient/family education    PT Goals (Current goals can be found in the Care Plan section)  Acute Rehab PT Goals Patient Stated Goal: Regain independence and return to work PT Goal Formulation: With patient Time For Goal Achievement: 09/06/24 Potential to Achieve Goals: Good    Frequency Min 3X/week     Co-evaluation               AM-PAC PT 6 Clicks Mobility  Outcome Measure Help needed turning from your back to your side while in a flat bed without using bedrails?: A Little Help needed moving from lying on your back to sitting on the side of a  flat bed without using bedrails?: A Little Help needed moving to and from a bed to a chair (including a wheelchair)?: A Little Help needed standing up from a chair using your arms (e.g., wheelchair or bedside chair)?: Total Help needed to walk in hospital room?: Total Help needed climbing 3-5 steps with a railing? : Total 6 Click Score: 12    End of Session Equipment Utilized During Treatment: Gait belt Activity Tolerance: Patient tolerated treatment well Patient left: in chair;with call bell/phone within reach Nurse Communication: Mobility status PT Visit Diagnosis: Other abnormalities of gait and mobility (R26.89);Difficulty in walking, not elsewhere classified  (R26.2);Unsteadiness on feet (R26.81)    Time: 9047-8982 PT Time Calculation (min) (ACUTE ONLY): 25 min   Charges:   PT Evaluation $PT Eval Moderate Complexity: 1 Mod   PT General Charges $$ ACUTE PT VISIT: 1 Visit         Randall SAUNDERS, PT, DPT Acute Rehabilitation Services Office: [phone] Secure Chat Preferred  Delon CHRISTELLA Callander 08/23/2024, 12:10 PM

## 2024-08-23 NOTE — PMR Pre-admission (Signed)
 PMR Admission Coordinator Pre-Admission Assessment  Patient: OVIE CORNELIO is an 53 y.o., male MRN: [id number] DOB: 05/18/1971 Height: 5' 7 (170.2 cm) Weight: 99.8 kg  Insurance Information HMO: Yes    PPO:      PCP:      IPA:      80/20:      OTHER:  PRIMARY: [employer]      Policy#: [id number]      Subscriber: patient CM Name:       Phone#: [phone]     Fax#: [phone] Pre-Cert#: [id number] approval for CIR from 08/24/24 to 08/28/24 with next review due on 08/29/24 at fax [phone]      Employer: [employer] Benefits:  Phone #: [phone]     Name: Verified on Availity 08/23/24 Eff. Date: 05/19/24     Deduct: $7500    Out of Pocket Max: $9200 (met $107.20)    Life Max: n/a CIR: 50% co-insurance      SNF: 50% con-insurance Outpatient:      Co-Pay: $50/visit Home Health:       Co-Pay: $50-100% per visit DME: 50%      Co-Pay: 50% Providers: in network  SECONDARY:       Policy#:      Phone#:   Artist:       Phone#:   The Data Processing Manager" for patients in Inpatient Rehabilitation Facilities with attached "Privacy Act Statement-Health Care Records" was provided and verbally reviewed with: Patient  Emergency Contact Information Contact Information     Name Relation Home Work Mobile   Thompson,Frances Significant other [phone]        Other Contacts   None on File     Current Medical History  Patient Admitting Diagnosis: New L BKA, old R BKA  History of Present Illness:  A 53 y.o. male admitted on11/3/25 to [facility] from the ED department for sepsis and left foot infection with gangrenous changes and malodorous. X-ray of L foot demonstrated possible osteomyelitis of the medial cortex of the left navicular bone with overlying soft tissue gas. Pt s/p L BKA 08/22/24. PMHx: T2DM, PAD, s/p L great toe amputation, s/p R 1st and 2nd partial ray amputation, s/p R TMA, s/p R BKA, HTN, HLD, restrictive lung disease, and GERD.  PT/OT  evaluations completed with recommendations for acute inpatient rehab admission.  Patient's medical record from [facility] has been reviewed by the rehabilitation admission coordinator and physician.  Past Medical History  Past Medical History:  Diagnosis Date   Blurry vision 07/10/2020   Diabetes mellitus without complication ([name])    Gangrene of toe of left foot ([name])    Peripheral arterial disease    Sepsis due to Streptococcus, group B ([name]) 06/20/2020   Streptococcal bacteremia     Has the patient had major surgery during 100 days prior to admission? Yes  Family History   family history is not on file.  Current Medications  Current Facility-Administered Medications:    albuterol  (PROVENTIL ) (2.5 MG/3ML) 0.083% nebulizer solution 2.5 mg, 2.5 mg, Inhalation, Q6H PRN, Baglia, Corrina, PA-C   amLODipine  (NORVASC ) tablet 10 mg, 10 mg, Oral, Daily, Diona Perkins, MD   aspirin  EC tablet 81 mg, 81 mg, Oral, Daily, Nemecek, Amanda, MD, 81 mg at 08/24/24 9062   atorvastatin  (LIPITOR) tablet 40 mg, 40 mg, Oral, Daily, Baglia, Corrina, PA-C, 40 mg at 08/24/24 9062   enoxaparin  (LOVENOX ) injection 40 mg, 40 mg, Subcutaneous, Q24H, Nemecek, Amanda, MD, 40 mg  at 08/24/24 0937   famotidine  (PEPCID ) tablet 40 mg, 40 mg, Oral, BID, Baglia, Corrina, PA-C, 40 mg at 08/24/24 0937   HYDROcodone-acetaminophen  (NORCO) 7.5-325 MG per tablet 1-2 tablet, 1-2 tablet, Oral, Q4H PRN, Baglia, Corrina, PA-C, 2 tablet at 08/24/24 1034   HYDROcodone-acetaminophen  (NORCO/VICODIN) 5-325 MG per tablet 1-2 tablet, 1-2 tablet, Oral, Q4H PRN, Baglia, Corrina, PA-C, 2 tablet at 08/23/24 1640   insulin  aspart (novoLOG ) injection 0-15 Units, 0-15 Units, Subcutaneous, TID WC, Pray, Margaret E, MD, 3 Units at 08/24/24 1138   insulin  aspart (novoLOG ) injection 0-5 Units, 0-5 Units, Subcutaneous, QHS, Pray, Rollene BRAVO, MD   insulin  glargine-yfgn (SEMGLEE ) injection 25 Units, 25 Units, Subcutaneous, Daily, Diona Perkins,  MD, 25 Units at 08/23/24 2116   ketorolac  (TORADOL ) 15 MG/ML injection 15 mg, 15 mg, Intravenous, Q6H PRN, Baglia, Corrina, PA-C   LORazepam  (ATIVAN ) tablet 0.5 mg, 0.5 mg, Oral, Once, Majeed, Sara, DO   montelukast  (SINGULAIR ) tablet 10 mg, 10 mg, Oral, QHS, Baglia, Corrina, PA-C, 10 mg at 08/23/24 2113   ondansetron  (ZOFRAN -ODT) disintegrating tablet 4 mg, 4 mg, Oral, Q8H PRN, Baglia, Corrina, PA-C   penicillin  G potassium 24 Million Units in dextrose  5 % 500 mL CONTINUOUS infusion, 24 Million Units, Intravenous, Q24H, Dixon, Stephanie N, NP, Last Rate: 20.8 mL/hr at 08/24/24 1035, 24 Million Units at 08/24/24 1035   potassium chloride  SA (KLOR-CON  M) CR tablet 40-60 mEq, 40-60 mEq, Oral, Daily PRN, Baglia, Corrina, PA-C  Patients Current Diet:  Diet Order             Diet Carb Modified Fluid consistency: Thin; Room service appropriate? Yes  Diet effective now                   Precautions / Restrictions Precautions Precautions: Fall Other Brace: L limb protector (Pt declined to wear d/t discomfort and tenderness on L residual limb) Restrictions Weight Bearing Restrictions Per Provider Order: Yes LLE Weight Bearing Per Provider Order: Non weight bearing   Has the patient had 2 or more falls or a fall with injury in the past year? Yes  Prior Activity Level Community (5-7x/wk): Went out daily.  Worked as a investment banker, operational.  Prior Functional Level Self Care: Did the patient need help bathing, dressing, using the toilet or eating? Independent  Indoor Mobility: Did the patient need assistance with walking from room to room (with or without device)? Independent  Stairs: Did the patient need assistance with internal or external stairs (with or without device)? Independent  Functional Cognition: Did the patient need help planning regular tasks such as shopping or remembering to take medications? Independent  Patient Information Are you of Hispanic, Latino/a,or Spanish origin?: A. No, not  of Hispanic, Latino/a, or Spanish origin What is your race?: A. White Do you need or want an interpreter to communicate with a doctor or health care staff?: 0. No Patient information obtained via proxy : No  Patient's Response To:  Health Literacy and Transportation Is the patient able to respond to health literacy and transportation needs?: Yes Health Literacy - How often do you need to have someone help you when you read instructions, pamphlets, or other written material from your doctor or pharmacy?: Never In the past 12 months, has lack of transportation kept you from medical appointments or from getting medications?: No In the past 12 months, has lack of transportation kept you from meetings, work, or from getting things needed for daily living?: No Health Literacy and Transportation obtained via proxy:  No  Home Assistive Devices / Equipment Home Equipment: Wheelchair - manual, Rollator (4 wheels), Grab bars - tub/shower, Shower seat - built in  Prior Device Use: Indicate devices/aids used by the patient prior to current illness, exacerbation or injury? Walker, Orthotics/Prosthetics, and 3-wheel rolly cart  Current Functional Level Cognition  Orientation Level: Oriented X4    Extremity Assessment (includes Sensation/Coordination)  Upper Extremity Assessment: Defer to OT evaluation  Lower Extremity Assessment: LLE deficits/detail LLE Deficits / Details: Pt POD 1 s/p BKA. Decreased hip and knee AROM. Grossly 3/5 strength. LLE: Unable to fully assess due to pain LLE Sensation: decreased proprioception LLE Coordination: decreased gross motor    ADLs  Overall ADL's : Needs assistance/impaired Eating/Feeding: Independent, Sitting Grooming: Set up, Sitting Upper Body Bathing: Set up, Sitting Lower Body Bathing: Minimal assistance, Sitting/lateral leans Upper Body Dressing : Set up, Sitting Lower Body Dressing: Minimal assistance, Sitting/lateral leans Toilet Transfer: Contact  guard assist, Transfer board    Mobility  Overal bed mobility: Needs Assistance Bed Mobility: Supine to Sit Supine to sit: Contact guard, Used rails, HOB elevated General bed mobility comments: Pt declined to sit EOB, engaging in scooting activity, or participate in repositioning higher up in bed and/or with sheets and pad fixed. Provided max encouragement with wife present and supportive of pt to move OOB. He continued to decline.    Transfers  Overall transfer level: Needs assistance Equipment used: None Transfers: Bed to chair/wheelchair/BSC Bed to/from chair/wheelchair/BSC transfer type:: Lateral/scoot transfer  Lateral/Scoot Transfers: Contact guard assist General transfer comment: Pt declined to transfer to recliner chair despite encouragement and education on the benefits.    Ambulation / Gait / Stairs / Wheelchair Mobility  Ambulation/Gait General Gait Details: Deferred d/t open wound on distal anterior R residual limb. Unable to apply prosthesis.    Posture / Balance Dynamic Sitting Balance Sitting balance - Comments: Pt sat EOB with close supervision and engaged in scooting with CGA. Balance Overall balance assessment: Needs assistance (Session remained bed level) Sitting-balance support: Bilateral upper extremity supported, Feet unsupported Sitting balance-Leahy Scale: Fair Sitting balance - Comments: Pt sat EOB with close supervision and engaged in scooting with CGA.    Special considerations/life events  Continuous Drip IV  IVPB antibiotics, Skin new post op incision left BKA with dressing and dressing to old right BKA stump area, Diabetic management yes h/o DM and Hgb A1c 6.5, and Special service needs None   Previous Home Environment (from acute therapy documentation) Living Arrangements: Spouse/significant other Available Help at Discharge: Family, Available 24 hours/day Type of Home: House (townhome) Home Layout: Two level, 1/2 bath on main level, Bed/bath  upstairs Alternate Level Stairs-Rails: Right Alternate Level Stairs-Number of Steps: 16 Home Access: Level entry Bathroom Shower/Tub: Psychologist, counselling, Engineer, Manufacturing Systems: Pharmacist, Community: No Home Care Services: No  Discharge Living Setting Plans for Discharge Living Setting: Patient's home, House, Lives with (comment) (Lives with SO in a townhouse.) Type of Home at Discharge: House Discharge Home Layout: Two level, 1/2 bath on main level, Bed/bath upstairs Alternate Level Stairs-Rails: Right Alternate Level Stairs-Number of Steps: 16-17 Discharge Home Access: Level entry Discharge Bathroom Shower/Tub: Tub/shower unit, Walk-in shower, Curtain Discharge Bathroom Toilet: Standard Discharge Bathroom Accessibility: Yes How Accessible: Accessible via walker Does the patient have any problems obtaining your medications?: No  Social/Family/Support Systems Patient Roles: Partner Contact Information: [name] - SO - [phone] Anticipated Caregiver: MYNA Cathlean Ability/Limitations of Caregiver: Caregiver SO works [employer] Caregiver Availability: Intermittent Discharge Plan Discussed  with Primary Caregiver: Yes Is Caregiver In Agreement with Plan?: Yes Does Caregiver/Family have Issues with Lodging/Transportation while Pt is in Rehab?: No  Goals Patient/Family Goal for Rehab: PT/OT mod I and supervision goals Expected length of stay: 7-10 days Pt/Family Agrees to Admission and willing to participate: Yes Program Orientation Provided & Reviewed with Pt/Caregiver Including Roles  & Responsibilities: Yes  Decrease burden of Care through IP rehab admission: N/A  Possible need for SNF placement upon discharge: Not planned  Patient Condition: I have reviewed medical records from [facility], spoken with CM, and patient and spouse. I met with patient at the bedside for inpatient rehabilitation assessment.  Patient will benefit from ongoing PT and OT, can  actively participate in 3 hours of therapy a day 5 days of the week, and can make measurable gains during the admission.  Patient will also benefit from the coordinated team approach during an Inpatient Acute Rehabilitation admission.  The patient will receive intensive therapy as well as Rehabilitation physician, nursing, social worker, and care management interventions.  Due to bladder management, bowel management, safety, skin/wound care, disease management, medication administration, pain management, and patient education the patient requires 24 hour a day rehabilitation nursing.  The patient is currently CGA to min assist with mobility and basic ADLs.  Discharge setting and therapy post discharge at home with home health is anticipated.  Patient has agreed to participate in the Acute Inpatient Rehabilitation Program and will admit today.  Preadmission Screen Completed By:  Lovett CHRISTELLA Ropes, 08/24/2024 12:51 PM ______________________________________________________________________   Discussed status with Dr. Cornelio on 08/24/24 at 1251 and received approval for admission today.  Admission Coordinator:  Lovett CHRISTELLA Ropes, RN, time 1251/Date 08/24/24   Assessment/Plan: Diagnosis:  New L BKA and old R BKA Does the need for close, 24 hr/day Medical supervision in concert with the patient's rehab needs make it unreasonable for this patient to be served in a less intensive setting? Yes Co-Morbidities requiring supervision/potential complications: DM, HTN, bacteremia- on IV ABX, transaminitis; need to switch pain meds due to LFT elevation; HLD, restrictive lung disease Due to bladder management, bowel management, safety, skin/wound care, disease management, medication administration, pain management, and patient education, does the patient require 24 hr/day rehab nursing? Yes Does the patient require coordinated care of a physician, rehab nurse, PT, OT, to address physical and functional deficits in the context  of the above medical diagnosis(es)? Yes Addressing deficits in the following areas: balance, endurance, locomotion, strength, transferring, bathing, dressing, feeding, grooming, and toileting Can the patient actively participate in an intensive therapy program of at least 3 hrs of therapy 5 days a week? Yes The potential for patient to make measurable gains while on inpatient rehab is good Anticipated functional outcomes upon discharge from inpatient rehab: modified independent and supervision PT, modified independent and supervision OT, n/a SLP Estimated rehab length of stay to reach the above functional goals is: 7-10 days at w/c level Anticipated discharge destination: Home 10. Overall Rehab/Functional Prognosis: good   MD Signature:

## 2024-08-23 NOTE — Progress Notes (Signed)
 Pt. Refused MRI due to claustrophobia MD notified

## 2024-08-23 NOTE — Assessment & Plan Note (Addendum)
 T2DM: 20u glargine daily with moderate SSI; increase to 25u daily and add QHS (home insulin  glargine 40 units daily, Humalog  20 units TID) HTN: Held home Amlodipine  10 mg daily, hydrochlorothiazide  25 mg daily; consider adding back amlodipine  if BP remains elevated HLD: Continuing home Atorvastatin  40 mg daily Restrictive Lung Disease: Continuing home montelukast  10 mg daily, albuterol  prn GERD: Continuing home famotidine  40 mg BID

## 2024-08-23 NOTE — Assessment & Plan Note (Signed)
 Cr normalized. Continued po intake encouraged.

## 2024-08-23 NOTE — Assessment & Plan Note (Signed)
 Na of 133 today, stable. - Continue to monitor

## 2024-08-23 NOTE — Consult Note (Signed)
 [facility]    Date of Admission:  08/21/2024     Total days of antibiotics 3  Linezoild   Unasyn                 Reason for Consult: Group B Strep Bacteremia     Referring Provider: Pray  Primary Care Provider: Toma Matas, MD   Assessment: Sean Carter is a 53 y.o. male admitted with:    Group B Streptococcus Bacteremia -  Necrotic Left DFU -  Secondary to infected gangrenous foot now since definitive amputation with vascular team on 11/4. With source control and 24h broader spectrum abx after completed, will narrow to IV penicillin  for bacteremia treatment. Given back pain will image with MRI T and L spine - he has no tenderness along vertebra but he states high pain threshold. He does not have any groin pain or radicular symptoms. Likely more chronic non-infectious process but warrants investigation which he would like to pursue while here.  - Ordered T/L spine MRI  - Repeat blood cultures to ensure clearance as he has ongoing sweating and subjective fevers.  - Transthoracic Echo (less likely endocarditis organism)   H/O Remote Childhood PCN Allergy -  Debunked and no longer relevant as he has tolerated unasyn  this admission. We discussed with him and his wife today.  - narrow to pcn - remove from allergy list   Diabetes -  Lab Results  Component Value Date   HGBA1C 6.5 (H) 08/22/2024  Glycemic control to help with healing. A1C in range!   Plan: - Ordered T/L spine MRI  - Repeat blood cultures to ensure clearance as he has ongoing sweating and subjective fevers.  - Transthoracic Echo (less likely endocarditis organism)  - narrow to pcn infusion  - removed PCN from allergy list     Principal Problem:   Sepsis due to Streptococcus agalactiae ([name]) Active Problems:   Hyponatremia   Hypokalemia   Sepsis with streptococcus agalactiae ([name])   Amputation of left great toe   Hypertension associated with diabetes ([name])    Foot osteomyelitis, left s/p BKA ([name])   Chronic health problem   Anemia   Osteomyelitis of left foot ([name])    acetaminophen   650 mg Oral Q8H   aspirin  EC  81 mg Oral Daily   atorvastatin   40 mg Oral Daily   enoxaparin  (LOVENOX ) injection  40 mg Subcutaneous Q24H   famotidine   40 mg Oral BID   insulin  aspart  0-15 Units Subcutaneous TID WC   insulin  glargine-yfgn  20 Units Subcutaneous Daily   montelukast   10 mg Oral QHS    HPI: Sean Carter is a 53 y.o. male admitted with:   PMHx PAD S/P Right BKA, T2DM, HTN, HLD, GERD, Lung disease.  Admitted with gangrenous foot changes and underwent Lt BKA 11/4. Also found to have group b streptococcus bacteremia with positive blood cultures on 11/3 growing in all 4 collected bottles.  He did a great job moving from bed to chair given now bilateral BKAs. He still has drenching sweats noted overnight. Describes low and mid back pain 12/10 pain. States it has been more chronic and did have fall 3.5 weeks ago. He has a high pain threshold and usually does not feel much regarding pain. He feels this amputation is different than last time and has affected him more severely. He does not recall being bacteremic last time.   Review of  Systems: ROS   Past Medical History:  Diagnosis Date   Blurry vision 07/10/2020   Diabetes mellitus without complication ([name])    Gangrene of toe of left foot ([name])    Peripheral arterial disease    Sepsis due to Streptococcus, group B ([name]) 06/20/2020   Streptococcal bacteremia     Social History   Tobacco Use   Smoking status: Never    Passive exposure: Never   Smokeless tobacco: Never  Vaping Use   Vaping status: Never Used  Substance Use Topics   Drug use: Never    History reviewed. No pertinent family history. Allergies  Allergen Reactions   Scallops [Shellfish Allergy] Anaphylaxis    OBJECTIVE: Blood pressure (!) 143/73, pulse 74, temperature 99.2 F (37.3 C), temperature source Oral, resp.  rate 17, height 5' 7 (1.702 m), weight 99.8 kg, SpO2 95%.  Physical Exam Constitutional:      Appearance: Normal appearance. He is not ill-appearing.  HENT:     Head: Normocephalic.     Mouth/Throat:     Mouth: Mucous membranes are moist.     Pharynx: Oropharynx is clear.  Eyes:     General: No scleral icterus. Cardiovascular:     Rate and Rhythm: Normal rate and regular rhythm.     Heart sounds: No murmur heard. Pulmonary:     Effort: Pulmonary effort is normal.  Musculoskeletal:     Cervical back: Normal range of motion.     Comments: Clean op dressing.   Skin:    Capillary Refill: Capillary refill takes less than 2 seconds.     Coloration: Skin is not jaundiced or pale.  Neurological:     Mental Status: He is alert and oriented to person, place, and time.  Psychiatric:        Mood and Affect: Mood normal.        Judgment: Judgment normal.     Lab Results Lab Results  Component Value Date   WBC 11.5 (H) 08/23/2024   HGB 9.5 (L) 08/23/2024   HCT 29.3 (L) 08/23/2024   MCV 82.8 08/23/2024   PLT 284 08/23/2024    Lab Results  Component Value Date   CREATININE 1.16 08/23/2024   BUN 25 (H) 08/23/2024   NA 133 (L) 08/23/2024   K 3.5 08/23/2024   CL 96 (L) 08/23/2024   CO2 27 08/23/2024    Lab Results  Component Value Date   ALT 50 (H) 08/21/2024   AST 55 (H) 08/21/2024   ALKPHOS 160 (H) 08/21/2024   BILITOT 1.6 (H) 08/21/2024     Microbiology: Recent Results (from the past 240 hours)  Culture, blood (Routine x 2)     Status: Abnormal (Preliminary result)   Collection Time: 08/21/24  1:08 PM   Specimen: BLOOD  Result Value Ref Range Status   Specimen Description BLOOD SITE NOT SPECIFIED  Final   Special Requests   Final    BOTTLES DRAWN AEROBIC AND ANAEROBIC Blood Culture results may not be optimal due to an inadequate volume of blood received in culture bottles   Culture  Setup Time   Final    GRAM POSITIVE COCCI IN CHAINS BOTTLES DRAWN AEROBIC  ONLY CRITICAL VALUE NOTED.  VALUE IS CONSISTENT WITH PREVIOUSLY REPORTED AND CALLED VALUE. Performed at [facility], [address] [place], [place] [address]    Culture GROUP B STREP(S.AGALACTIAE)ISOLATED (A)  Final   Report Status PENDING  Incomplete  Culture, blood (Routine x 2)  Status: Abnormal (Preliminary result)   Collection Time: 08/21/24  2:09 PM   Specimen: BLOOD  Result Value Ref Range Status   Specimen Description BLOOD SITE NOT SPECIFIED  Final   Special Requests   Final    BOTTLES DRAWN AEROBIC AND ANAEROBIC Blood Culture results may not be optimal due to an inadequate volume of blood received in culture bottles   Culture  Setup Time   Final    GRAM POSITIVE COCCI IN CHAINS ANAEROBIC BOTTLE ONLY CRITICAL RESULT CALLED TO, READ BACK BY AND VERIFIED WITH: ESABRA QUIET PHARMD, AT 1433 08/22/24 D. VANHOOK    Culture (A)  Final    GROUP B STREP(S.AGALACTIAE)ISOLATED SUSCEPTIBILITIES TO FOLLOW Performed at [facility], [address] [place], [place] [address]    Report Status PENDING  Incomplete  Blood Culture ID Panel (Reflexed)     Status: Abnormal   Collection Time: 08/21/24  2:09 PM  Result Value Ref Range Status   Enterococcus faecalis NOT DETECTED NOT DETECTED Final   Enterococcus Faecium NOT DETECTED NOT DETECTED Final   Listeria monocytogenes NOT DETECTED NOT DETECTED Final   Staphylococcus species NOT DETECTED NOT DETECTED Final   Staphylococcus aureus (BCID) NOT DETECTED NOT DETECTED Final   Staphylococcus epidermidis NOT DETECTED NOT DETECTED Final   Staphylococcus lugdunensis NOT DETECTED NOT DETECTED Final   Streptococcus species DETECTED (A) NOT DETECTED Final    Comment: CRITICAL RESULT CALLED TO, READ BACK BY AND VERIFIED WITH: ESABRA QUIET PHARMD, AT 1433 08/22/24 D. VANHOOK    Streptococcus agalactiae DETECTED (A) NOT DETECTED Final    Comment: CRITICAL RESULT CALLED TO, READ BACK BY AND VERIFIED WITH: ESABRA QUIET PHARMD, AT 1433  08/22/24 D. VANHOOK    Streptococcus pneumoniae NOT DETECTED NOT DETECTED Final   Streptococcus pyogenes NOT DETECTED NOT DETECTED Final   A.calcoaceticus-baumannii NOT DETECTED NOT DETECTED Final   Bacteroides fragilis NOT DETECTED NOT DETECTED Final   Enterobacterales NOT DETECTED NOT DETECTED Final   Enterobacter cloacae complex NOT DETECTED NOT DETECTED Final   Escherichia coli NOT DETECTED NOT DETECTED Final   Klebsiella aerogenes NOT DETECTED NOT DETECTED Final   Klebsiella oxytoca NOT DETECTED NOT DETECTED Final   Klebsiella pneumoniae NOT DETECTED NOT DETECTED Final   Proteus species NOT DETECTED NOT DETECTED Final   Salmonella species NOT DETECTED NOT DETECTED Final   Serratia marcescens NOT DETECTED NOT DETECTED Final   Haemophilus influenzae NOT DETECTED NOT DETECTED Final   Neisseria meningitidis NOT DETECTED NOT DETECTED Final   Pseudomonas aeruginosa NOT DETECTED NOT DETECTED Final   Stenotrophomonas maltophilia NOT DETECTED NOT DETECTED Final   Candida albicans NOT DETECTED NOT DETECTED Final   Candida auris NOT DETECTED NOT DETECTED Final   Candida glabrata NOT DETECTED NOT DETECTED Final   Candida krusei NOT DETECTED NOT DETECTED Final   Candida parapsilosis NOT DETECTED NOT DETECTED Final   Candida tropicalis NOT DETECTED NOT DETECTED Final   Cryptococcus neoformans/gattii NOT DETECTED NOT DETECTED Final    Comment: Performed at [facility], [address] [place], [place] [address]    Corean Fireman, MSN, NP-C [facility] Pager: [phone]  08/23/2024 9:46 AM    Total Encounter Time: 61m

## 2024-08-23 NOTE — Assessment & Plan Note (Addendum)
-   Vascular surgery following, appreciate recs - Blood cultures growing streptococcus agalactiae - ID consulted re: abx regimen - Continue current abx regimen plus start Penicillin  G - Getting repeat blood cultures - Getting TTE - Getting MRI of thoracic and lumbar spine - Antibiotics: IV Unasyn  (11/4- ), IV Penicillin  G (11/5- ) - IV Zosyn  and Vanc (11/3-11/4) - IV Unasyn  3 g and IV linezolid 600 mg (11/3) - Pain regimen: - Acetaminophen  650 mg every 8 hour with additional 325-650 mg every 6 hours as needed for mild pain -> stop prn tylenol  given other tyenol - Hydrocodone-acetaminophen  5 or 7.5-325 1-2 tablets every 4 hours as needed - Ketorolac  15 mg IV every 6 hours as needed - Morphine 0.5-1 mg IV every 2 hours as needed -> discontinue as pt will not take - Bowel regimen: Holding Miralax , senna in setting of loose stools - Zofran  ODT 4 mg q8h prn for N/V - Restarting aspirin  81 mg per vasc surg (Dr. Sheree) recommendations - AM Labs: CBC, CMP - Fall precautions - PT/OT eval and treat

## 2024-08-23 NOTE — Progress Notes (Signed)
  Progress Note    08/23/2024 7:24 AM 1 Day Post-Op  Subjective:  having left limb pain  Vitals:   08/22/24 2131 08/23/24 0400  BP: 115/84 (!) 143/73  Pulse: 74 74  Resp: 18 17  Temp: 99.6 F (37.6 C) 99.2 F (37.3 C)  SpO2: 97% 95%    Physical Exam: Aaox3 Left bka dressing cdi  CBC    Component Value Date/Time   WBC 11.5 (H) 08/23/2024 0311   RBC 3.54 (L) 08/23/2024 0311   HGB 9.5 (L) 08/23/2024 0311   HGB 15.0 05/03/2024 1039   HCT 29.3 (L) 08/23/2024 0311   HCT 45.7 05/03/2024 1039   PLT 284 08/23/2024 0311   PLT 327 05/03/2024 1039   MCV 82.8 08/23/2024 0311   MCV 86 05/03/2024 1039   MCH 26.8 08/23/2024 0311   MCHC 32.4 08/23/2024 0311   RDW 13.8 08/23/2024 0311   RDW 13.3 05/03/2024 1039   LYMPHSABS 0.7 08/21/2024 1308   LYMPHSABS 2.3 05/03/2024 1039   MONOABS 1.1 (H) 08/21/2024 1308   EOSABS 0.0 08/21/2024 1308   EOSABS 0.2 05/03/2024 1039   BASOSABS 0.0 08/21/2024 1308   BASOSABS 0.0 05/03/2024 1039    BMET    Component Value Date/Time   NA 133 (L) 08/23/2024 0311   NA 138 05/03/2024 1039   K 3.5 08/23/2024 0311   CL 96 (L) 08/23/2024 0311   CO2 27 08/23/2024 0311   GLUCOSE 255 (H) 08/23/2024 0311   BUN 25 (H) 08/23/2024 0311   BUN 21 05/03/2024 1039   CREATININE 1.16 08/23/2024 0311   CREATININE 0.78 07/10/2020 0910   CALCIUM  7.9 (L) 08/23/2024 0311   GFRNONAA >60 08/23/2024 0311   GFRNONAA 106 07/10/2020 0910   GFRAA 123 07/10/2020 0910    INR    Component Value Date/Time   INR 1.2 08/21/2024 1308     Intake/Output Summary (Last 24 hours) at 08/23/2024 0724 Last data filed at 08/23/2024 0600 Gross per 24 hour  Intake 100 ml  Output 600 ml  Net -500 ml     Assessment:  53 y.o. male is s/p L BKA  Plan:  Ace wrap down tomorrow unless his pain does not subside then can be removed today and replaced after allowing the leg to rest.   Ambushield when he is out of bed.    Albertia Carvin C. Sheree, MD [facility] [place] Office: [phone] Pager: [phone]  08/23/2024 7:24 AM

## 2024-08-23 NOTE — Anesthesia Postprocedure Evaluation (Signed)
 Anesthesia Post Note  Patient: Sean Carter  Procedure(s) Performed: LEFT AMPUTATION BELOW KNEE (Left: Knee)     Patient location during evaluation: PACU Anesthesia Type: General Level of consciousness: awake and alert Pain management: pain level controlled Vital Signs Assessment: post-procedure vital signs reviewed and stable Respiratory status: spontaneous breathing, nonlabored ventilation and respiratory function stable Cardiovascular status: blood pressure returned to baseline and stable Postop Assessment: no apparent nausea or vomiting Anesthetic complications: no   No notable events documented.  Last Vitals:  Vitals:   08/22/24 2131 08/23/24 0400  BP: 115/84 (!) 143/73  Pulse: 74 74  Resp: 18 17  Temp: 37.6 C 37.3 C  SpO2: 97% 95%    Last Pain:  Vitals:   08/23/24 0400  TempSrc: Oral  PainSc:                  Butler Levander Pinal

## 2024-08-23 NOTE — Progress Notes (Signed)
 IP rehab admissions - I met with patient and his SO, Sean Carter.  They would like CIR prior to home.  I will open the case with insurance today.  [phone]

## 2024-08-23 NOTE — Assessment & Plan Note (Signed)
 K of 3.5 this morning. - Continue to follow - Consider stopping hydrochlorothiazide  on discharge given low K during hospitalization

## 2024-08-23 NOTE — Assessment & Plan Note (Signed)
 Stable Hgb of 9.5 today from 9.7 yesterday. - Continue to monitor - Transfusion threshold of 7

## 2024-08-23 NOTE — Inpatient Diabetes Management (Signed)
 Inpatient Diabetes Program Recommendations  AACE/ADA: New Consensus Statement on Inpatient Glycemic Control  Target Ranges:  Prepandial:   less than 140 mg/dL      Peak postprandial:   less than 180 mg/dL (1-2 hours)      Critically ill patients:  140 - 180 mg/dL    Latest Reference Range & Units 08/22/24 07:58 08/22/24 10:17 08/22/24 11:37 08/22/24 17:38 08/22/24 22:06 08/23/24 05:45  Glucose-Capillary 70 - 99 mg/dL 780 (H) 798 (H) 805 (H) 141 (H) 242 (H) 219 (H)    Review of Glycemic Control  Diabetes history: DM2 Outpatient Diabetes medications: Basaglar  40 units daily, Humalog  20 units BID Current orders for Inpatient glycemic control: Semglee  20 units daily, Novolog  0-15 units TID  Inpatient Diabetes Program Recommendations:    Insulin : Please consider increasing Semglee  to 25 units daily, add Novolog  0-5 units at bedtime, and if post prandial glucose is consistently over 180 mg/dl, consider adding  Novolog  3 units TID with meals for meal coverage if patient eats at least 50% of meals.  Thanks, Earnie Gainer, RN, MSN, CDCES Diabetes Coordinator Inpatient Diabetes Program [phone] (Team Pager from 8am to 5pm)

## 2024-08-24 ENCOUNTER — Encounter (HOSPITAL_COMMUNITY): Payer: Self-pay | Admitting: Physical Medicine and Rehabilitation

## 2024-08-24 ENCOUNTER — Other Ambulatory Visit: Payer: Self-pay

## 2024-08-24 ENCOUNTER — Other Ambulatory Visit (HOSPITAL_COMMUNITY): Payer: Self-pay

## 2024-08-24 ENCOUNTER — Encounter (HOSPITAL_COMMUNITY): Payer: Self-pay

## 2024-08-24 ENCOUNTER — Inpatient Hospital Stay (HOSPITAL_COMMUNITY)
Admission: AD | Admit: 2024-08-24 | Discharge: 2024-09-04 | DRG: 560 | Disposition: A | Discharge status: Home / Self Care | Source: Intra-hospital | Attending: Physical Medicine and Rehabilitation | Admitting: Physical Medicine and Rehabilitation

## 2024-08-24 DIAGNOSIS — S88112A Complete traumatic amputation at level between knee and ankle, left lower leg, initial encounter: Secondary | ICD-10-CM | POA: Diagnosis not present

## 2024-08-24 DIAGNOSIS — E871 Hypo-osmolality and hyponatremia: Secondary | ICD-10-CM | POA: Diagnosis not present

## 2024-08-24 DIAGNOSIS — Z91013 Allergy to seafood: Secondary | ICD-10-CM

## 2024-08-24 DIAGNOSIS — E1152 Type 2 diabetes mellitus with diabetic peripheral angiopathy with gangrene: Secondary | ICD-10-CM | POA: Diagnosis present

## 2024-08-24 DIAGNOSIS — S88112S Complete traumatic amputation at level between knee and ankle, left lower leg, sequela: Secondary | ICD-10-CM | POA: Diagnosis not present

## 2024-08-24 DIAGNOSIS — Z89431 Acquired absence of right foot: Secondary | ICD-10-CM | POA: Diagnosis not present

## 2024-08-24 DIAGNOSIS — D649 Anemia, unspecified: Secondary | ICD-10-CM | POA: Diagnosis present

## 2024-08-24 DIAGNOSIS — B955 Unspecified streptococcus as the cause of diseases classified elsewhere: Secondary | ICD-10-CM | POA: Diagnosis not present

## 2024-08-24 DIAGNOSIS — E875 Hyperkalemia: Secondary | ICD-10-CM | POA: Diagnosis not present

## 2024-08-24 DIAGNOSIS — Z880 Allergy status to penicillin: Secondary | ICD-10-CM | POA: Diagnosis not present

## 2024-08-24 DIAGNOSIS — L089 Local infection of the skin and subcutaneous tissue, unspecified: Secondary | ICD-10-CM | POA: Diagnosis present

## 2024-08-24 DIAGNOSIS — K760 Fatty (change of) liver, not elsewhere classified: Secondary | ICD-10-CM | POA: Diagnosis present

## 2024-08-24 DIAGNOSIS — F40240 Claustrophobia: Secondary | ICD-10-CM | POA: Diagnosis present

## 2024-08-24 DIAGNOSIS — I10 Essential (primary) hypertension: Secondary | ICD-10-CM | POA: Diagnosis present

## 2024-08-24 DIAGNOSIS — E119 Type 2 diabetes mellitus without complications: Secondary | ICD-10-CM

## 2024-08-24 DIAGNOSIS — M869 Osteomyelitis, unspecified: Secondary | ICD-10-CM | POA: Diagnosis not present

## 2024-08-24 DIAGNOSIS — E1169 Type 2 diabetes mellitus with other specified complication: Secondary | ICD-10-CM | POA: Diagnosis not present

## 2024-08-24 DIAGNOSIS — S88112D Complete traumatic amputation at level between knee and ankle, left lower leg, subsequent encounter: Secondary | ICD-10-CM

## 2024-08-24 DIAGNOSIS — G8929 Other chronic pain: Secondary | ICD-10-CM | POA: Diagnosis not present

## 2024-08-24 DIAGNOSIS — R7881 Bacteremia: Secondary | ICD-10-CM | POA: Diagnosis present

## 2024-08-24 DIAGNOSIS — R7989 Other specified abnormal findings of blood chemistry: Secondary | ICD-10-CM | POA: Diagnosis not present

## 2024-08-24 DIAGNOSIS — E1159 Type 2 diabetes mellitus with other circulatory complications: Secondary | ICD-10-CM

## 2024-08-24 DIAGNOSIS — E1165 Type 2 diabetes mellitus with hyperglycemia: Secondary | ICD-10-CM | POA: Diagnosis present

## 2024-08-24 DIAGNOSIS — Z794 Long term (current) use of insulin: Secondary | ICD-10-CM

## 2024-08-24 DIAGNOSIS — E1140 Type 2 diabetes mellitus with diabetic neuropathy, unspecified: Secondary | ICD-10-CM | POA: Diagnosis not present

## 2024-08-24 DIAGNOSIS — Z89512 Acquired absence of left leg below knee: Secondary | ICD-10-CM | POA: Diagnosis not present

## 2024-08-24 DIAGNOSIS — Z89511 Acquired absence of right leg below knee: Secondary | ICD-10-CM | POA: Diagnosis not present

## 2024-08-24 DIAGNOSIS — R0989 Other specified symptoms and signs involving the circulatory and respiratory systems: Secondary | ICD-10-CM | POA: Diagnosis not present

## 2024-08-24 DIAGNOSIS — B951 Streptococcus, group B, as the cause of diseases classified elsewhere: Secondary | ICD-10-CM | POA: Diagnosis not present

## 2024-08-24 DIAGNOSIS — R198 Other specified symptoms and signs involving the digestive system and abdomen: Secondary | ICD-10-CM

## 2024-08-24 DIAGNOSIS — N179 Acute kidney failure, unspecified: Secondary | ICD-10-CM | POA: Diagnosis not present

## 2024-08-24 DIAGNOSIS — F54 Psychological and behavioral factors associated with disorders or diseases classified elsewhere: Secondary | ICD-10-CM

## 2024-08-24 DIAGNOSIS — Z79899 Other long term (current) drug therapy: Secondary | ICD-10-CM | POA: Diagnosis not present

## 2024-08-24 DIAGNOSIS — E11628 Type 2 diabetes mellitus with other skin complications: Secondary | ICD-10-CM

## 2024-08-24 DIAGNOSIS — L97819 Non-pressure chronic ulcer of other part of right lower leg with unspecified severity: Secondary | ICD-10-CM

## 2024-08-24 DIAGNOSIS — A401 Sepsis due to streptococcus, group B: Secondary | ICD-10-CM | POA: Diagnosis not present

## 2024-08-24 DIAGNOSIS — D62 Acute posthemorrhagic anemia: Secondary | ICD-10-CM | POA: Diagnosis present

## 2024-08-24 DIAGNOSIS — Z833 Family history of diabetes mellitus: Secondary | ICD-10-CM

## 2024-08-24 DIAGNOSIS — R7401 Elevation of levels of liver transaminase levels: Secondary | ICD-10-CM | POA: Diagnosis not present

## 2024-08-24 DIAGNOSIS — H538 Other visual disturbances: Secondary | ICD-10-CM | POA: Diagnosis not present

## 2024-08-24 DIAGNOSIS — E11319 Type 2 diabetes mellitus with unspecified diabetic retinopathy without macular edema: Secondary | ICD-10-CM | POA: Diagnosis present

## 2024-08-24 DIAGNOSIS — R059 Cough, unspecified: Secondary | ICD-10-CM | POA: Diagnosis not present

## 2024-08-24 DIAGNOSIS — M549 Dorsalgia, unspecified: Secondary | ICD-10-CM | POA: Diagnosis not present

## 2024-08-24 DIAGNOSIS — K5900 Constipation, unspecified: Secondary | ICD-10-CM | POA: Diagnosis not present

## 2024-08-24 DIAGNOSIS — Z4781 Encounter for orthopedic aftercare following surgical amputation: Principal | ICD-10-CM

## 2024-08-24 DIAGNOSIS — J984 Other disorders of lung: Secondary | ICD-10-CM | POA: Diagnosis not present

## 2024-08-24 DIAGNOSIS — D72829 Elevated white blood cell count, unspecified: Secondary | ICD-10-CM | POA: Diagnosis not present

## 2024-08-24 LAB — CULTURE, BLOOD (ROUTINE X 2)

## 2024-08-24 LAB — COMPREHENSIVE METABOLIC PANEL WITH GFR
ALT: 64 U/L — ABNORMAL HIGH (ref 0–44)
AST: 117 U/L — ABNORMAL HIGH (ref 15–41)
Albumin: 1.9 g/dL — ABNORMAL LOW (ref 3.5–5.0)
Alkaline Phosphatase: 140 U/L — ABNORMAL HIGH (ref 38–126)
Anion gap: 13 (ref 5–15)
BUN: 19 mg/dL (ref 6–20)
CO2: 28 mmol/L (ref 22–32)
Calcium: 7.9 mg/dL — ABNORMAL LOW (ref 8.9–10.3)
Chloride: 96 mmol/L — ABNORMAL LOW (ref 98–111)
Creatinine, Ser: 1 mg/dL (ref 0.61–1.24)
GFR, Estimated: >60 mL/min (ref >60)
Glucose, Bld: 112 mg/dL — ABNORMAL HIGH (ref 70–99)
Potassium: 3.5 mmol/L (ref 3.5–5.1)
Sodium: 137 mmol/L (ref 135–145)
Total Bilirubin: 0.6 mg/dL (ref 0.0–1.2)
Total Protein: 6.3 g/dL — ABNORMAL LOW (ref 6.5–8.1)

## 2024-08-24 LAB — CBC
HCT: 30.5 % — ABNORMAL LOW (ref 39.0–52.0)
Hemoglobin: 9.9 g/dL — ABNORMAL LOW (ref 13.0–17.0)
MCH: 26.9 pg (ref 26.0–34.0)
MCHC: 32.5 g/dL (ref 30.0–36.0)
MCV: 82.9 fL (ref 80.0–100.0)
Platelets: 337 K/uL (ref 150–400)
RBC: 3.68 MIL/uL — ABNORMAL LOW (ref 4.22–5.81)
RDW: 13.6 % (ref 11.5–15.5)
WBC: 10.9 K/uL — ABNORMAL HIGH (ref 4.0–10.5)
nRBC: 0 % (ref 0.0–0.2)

## 2024-08-24 LAB — GLUCOSE, CAPILLARY
Glucose-Capillary: 172 mg/dL — ABNORMAL HIGH (ref 70–99)
Glucose-Capillary: 174 mg/dL — ABNORMAL HIGH (ref 70–99)
Glucose-Capillary: 204 mg/dL — ABNORMAL HIGH (ref 70–99)
Glucose-Capillary: 206 mg/dL — ABNORMAL HIGH (ref 70–99)

## 2024-08-24 MED ORDER — FAMOTIDINE 20 MG PO TABS
40.0000 mg | ORAL_TABLET | Freq: Two times a day (BID) | ORAL | Status: DC
  Administered 2024-08-24 – 2024-09-04 (×22): 40 mg via ORAL
  Filled 2024-08-24 (×22): qty 2

## 2024-08-24 MED ORDER — INSULIN ASPART 100 UNIT/ML IJ SOLN
0.0000 [IU] | Freq: Three times a day (TID) | INTRAMUSCULAR | Status: DC
  Administered 2024-08-25 (×3): 2 [IU] via SUBCUTANEOUS
  Administered 2024-08-26: 3 [IU] via SUBCUTANEOUS
  Administered 2024-08-26: 5 [IU] via SUBCUTANEOUS
  Administered 2024-08-26: 2 [IU] via SUBCUTANEOUS
  Administered 2024-08-27: 3 [IU] via SUBCUTANEOUS
  Administered 2024-08-27: 5 [IU] via SUBCUTANEOUS
  Administered 2024-08-27: 7 [IU] via SUBCUTANEOUS
  Administered 2024-08-28: 3 [IU] via SUBCUTANEOUS
  Administered 2024-08-28: 7 [IU] via SUBCUTANEOUS
  Administered 2024-08-28: 9 [IU] via SUBCUTANEOUS
  Administered 2024-08-29: 3 [IU] via SUBCUTANEOUS
  Administered 2024-08-29 (×2): 5 [IU] via SUBCUTANEOUS
  Administered 2024-08-30: 3 [IU] via SUBCUTANEOUS
  Administered 2024-08-30 (×2): 2 [IU] via SUBCUTANEOUS
  Administered 2024-08-31: 1 [IU] via SUBCUTANEOUS
  Administered 2024-08-31: 2 [IU] via SUBCUTANEOUS
  Administered 2024-09-01 – 2024-09-02 (×3): 1 [IU] via SUBCUTANEOUS
  Administered 2024-09-02: 2 [IU] via SUBCUTANEOUS
  Administered 2024-09-03: 1 [IU] via SUBCUTANEOUS
  Administered 2024-09-03: 3 [IU] via SUBCUTANEOUS
  Administered 2024-09-04 (×2): 1 [IU] via SUBCUTANEOUS
  Filled 2024-08-24: qty 2
  Filled 2024-08-24: qty 1
  Filled 2024-08-24: qty 9
  Filled 2024-08-24: qty 2
  Filled 2024-08-24: qty 5
  Filled 2024-08-24 (×2): qty 3
  Filled 2024-08-24 (×2): qty 2
  Filled 2024-08-24: qty 5
  Filled 2024-08-24: qty 2
  Filled 2024-08-24: qty 3
  Filled 2024-08-24 (×3): qty 1
  Filled 2024-08-24 (×2): qty 5
  Filled 2024-08-24: qty 1
  Filled 2024-08-24: qty 3
  Filled 2024-08-24: qty 2
  Filled 2024-08-24: qty 3
  Filled 2024-08-24: qty 9
  Filled 2024-08-24: qty 7
  Filled 2024-08-24 (×2): qty 2
  Filled 2024-08-24: qty 7

## 2024-08-24 MED ORDER — AMLODIPINE BESYLATE 10 MG PO TABS
10.0000 mg | ORAL_TABLET | Freq: Every day | ORAL | Status: DC
  Administered 2024-08-24: 10 mg via ORAL
  Filled 2024-08-24: qty 1

## 2024-08-24 MED ORDER — INSULIN ASPART 100 UNIT/ML IJ SOLN: 0.0000 [IU] | Freq: Three times a day (TID) | INTRAMUSCULAR | Status: DC

## 2024-08-24 MED ORDER — ALUM & MAG HYDROXIDE-SIMETH 200-200-20 MG/5ML PO SUSP: 30.0000 mL | ORAL | Status: DC | PRN

## 2024-08-24 MED ORDER — INSULIN ASPART 100 UNIT/ML IJ SOLN: 0.0000 [IU] | Freq: Every day | INTRAMUSCULAR | Status: DC

## 2024-08-24 MED ORDER — MONTELUKAST SODIUM 10 MG PO TABS
10.0000 mg | ORAL_TABLET | Freq: Every day | ORAL | Status: DC
  Administered 2024-08-24 – 2024-09-03 (×11): 10 mg via ORAL
  Filled 2024-08-24 (×11): qty 1

## 2024-08-24 MED ORDER — PROCHLORPERAZINE MALEATE 5 MG PO TABS: 5.0000 mg | ORAL_TABLET | Freq: Four times a day (QID) | ORAL | Status: DC | PRN

## 2024-08-24 MED ORDER — ONDANSETRON 4 MG PO TBDP: 4.0000 mg | ORAL_TABLET | Freq: Three times a day (TID) | ORAL | Status: DC | PRN

## 2024-08-24 MED ORDER — TRAMADOL HCL 50 MG PO TABS
50.0000 mg | ORAL_TABLET | Freq: Two times a day (BID) | ORAL | 0 refills | Status: DC
  Filled 2024-08-24: qty 14, 7d supply, fill #0

## 2024-08-24 MED ORDER — AMLODIPINE BESYLATE 10 MG PO TABS
10.0000 mg | ORAL_TABLET | Freq: Every day | ORAL | Status: DC
  Administered 2024-08-25 – 2024-09-04 (×11): 10 mg via ORAL
  Filled 2024-08-24 (×11): qty 1

## 2024-08-24 MED ORDER — DIPHENHYDRAMINE HCL 25 MG PO CAPS
25.0000 mg | ORAL_CAPSULE | Freq: Four times a day (QID) | ORAL | Status: DC | PRN
Start: 2024-08-24 — End: 2024-09-04
  Administered 2024-08-24 – 2024-09-03 (×8): 25 mg via ORAL
  Filled 2024-08-24 (×7): qty 1

## 2024-08-24 MED ORDER — PROCHLORPERAZINE 25 MG RE SUPP: 12.5000 mg | Freq: Four times a day (QID) | RECTAL | Status: DC | PRN

## 2024-08-24 MED ORDER — BISACODYL 10 MG RE SUPP: 10.0000 mg | Freq: Every day | RECTAL | Status: DC | PRN

## 2024-08-24 MED ORDER — INSULIN GLARGINE-YFGN 100 UNIT/ML ~~LOC~~ SOLN
30.0000 [IU] | Freq: Every day | SUBCUTANEOUS | Status: DC
  Administered 2024-08-24 – 2024-08-27 (×4): 30 [IU] via SUBCUTANEOUS
  Filled 2024-08-24 (×5): qty 0.3

## 2024-08-24 MED ORDER — INSULIN ASPART 100 UNIT/ML IJ SOLN
0.0000 [IU] | Freq: Every day | INTRAMUSCULAR | Status: DC
  Administered 2024-08-24 – 2024-08-26 (×3): 2 [IU] via SUBCUTANEOUS
  Administered 2024-08-27: 4 [IU] via SUBCUTANEOUS
  Administered 2024-08-28: 10 [IU] via SUBCUTANEOUS
  Administered 2024-08-29: 4 [IU] via SUBCUTANEOUS
  Administered 2024-08-30: 2 [IU] via SUBCUTANEOUS
  Filled 2024-08-24: qty 2
  Filled 2024-08-24: qty 5
  Filled 2024-08-24 (×2): qty 2
  Filled 2024-08-24 (×2): qty 4

## 2024-08-24 MED ORDER — ASPIRIN 81 MG PO TBEC: 81.0000 mg | DELAYED_RELEASE_TABLET | Freq: Every day | ORAL | Status: DC

## 2024-08-24 MED ORDER — ALBUTEROL SULFATE (2.5 MG/3ML) 0.083% IN NEBU: 2.5000 mg | INHALATION_SOLUTION | Freq: Four times a day (QID) | RESPIRATORY_TRACT | Status: DC | PRN

## 2024-08-24 MED ORDER — INSULIN GLARGINE-YFGN 100 UNIT/ML ~~LOC~~ SOLN: 25.0000 [IU] | Freq: Every day | SUBCUTANEOUS | Status: DC

## 2024-08-24 MED ORDER — GUAIFENESIN-DM 100-10 MG/5ML PO SYRP
5.0000 mL | ORAL_SOLUTION | Freq: Four times a day (QID) | ORAL | Status: DC | PRN
  Administered 2024-08-28: 10 mL via ORAL
  Filled 2024-08-24: qty 10

## 2024-08-24 MED ORDER — TRAZODONE HCL 50 MG PO TABS
25.0000 mg | ORAL_TABLET | Freq: Every evening | ORAL | Status: DC | PRN
  Filled 2024-08-24 (×2): qty 1

## 2024-08-24 MED ORDER — TRAMADOL HCL 50 MG PO TABS
50.0000 mg | ORAL_TABLET | Freq: Four times a day (QID) | ORAL | Status: DC | PRN
  Administered 2024-08-24 – 2024-08-29 (×6): 50 mg via ORAL
  Filled 2024-08-24: qty 2
  Filled 2024-08-24 (×5): qty 1

## 2024-08-24 MED ORDER — PENICILLIN G POTASSIUM 20000000 UNITS IJ SOLR
24.0000 10*6.[IU] | INTRAVENOUS | Status: DC
  Administered 2024-08-25 – 2024-09-03 (×10): 24 10*6.[IU] via INTRAVENOUS
  Filled 2024-08-24 (×5): qty 20
  Filled 2024-08-24: qty 24
  Filled 2024-08-24 (×4): qty 20
  Filled 2024-08-24: qty 24

## 2024-08-24 MED ORDER — ASPIRIN 81 MG PO TBEC
81.0000 mg | DELAYED_RELEASE_TABLET | Freq: Every day | ORAL | Status: DC
  Administered 2024-08-25 – 2024-09-04 (×11): 81 mg via ORAL
  Filled 2024-08-24 (×11): qty 1

## 2024-08-24 MED ORDER — PENICILLIN G POTASSIUM 20000000 UNITS IJ SOLR: 24.0000 10*6.[IU] | INTRAVENOUS | Status: DC

## 2024-08-24 MED ORDER — KETOROLAC TROMETHAMINE 15 MG/ML IJ SOLN: 15.0000 mg | Freq: Four times a day (QID) | INTRAMUSCULAR | Status: AC | PRN

## 2024-08-24 MED ORDER — PROCHLORPERAZINE EDISYLATE 10 MG/2ML IJ SOLN: 5.0000 mg | Freq: Four times a day (QID) | INTRAMUSCULAR | Status: DC | PRN

## 2024-08-24 MED ORDER — ATORVASTATIN CALCIUM 40 MG PO TABS: 40.0000 mg | ORAL_TABLET | Freq: Every day | ORAL | Status: DC

## 2024-08-24 MED ORDER — ENOXAPARIN SODIUM 40 MG/0.4ML IJ SOSY
40.0000 mg | PREFILLED_SYRINGE | INTRAMUSCULAR | Status: DC
  Administered 2024-08-25 – 2024-09-04 (×11): 40 mg via SUBCUTANEOUS
  Filled 2024-08-24 (×11): qty 0.4

## 2024-08-24 MED ORDER — FLEET ENEMA RE ENEM: 1.0000 | ENEMA | Freq: Once | RECTAL | Status: DC | PRN

## 2024-08-24 NOTE — Plan of Care (Signed)

## 2024-08-24 NOTE — Progress Notes (Signed)
 Physical Therapy Treatment Patient Details Name: Sean Carter MRN: [id number] DOB: 1971-06-05 Today's Date: 08/24/2024   History of Present Illness Pt is 53 y.o. male admitted 08/21/24 for sepsis and left foot infection with gangrenous changes and malodorous. X-ray of L foot demonstrated possible osteomyelitis of the medial cortex of the left navicular bone with overlying soft tissue gas. Pt s/p L BKA 11/4. PMHx: T2DM, PAD, s/p L great toe amputation, s/p R 1st and 2nd partial ray amputation, s/p R TMA, s/p R BKA, HTN, HLD, restrictive lung disease, and GERD    PT Comments  Pt greeted supine in bed, pleasant and agreeable to PT session. His R BKA prosthesis was present in the room. Examined R residual limb and discovered a small open wound on the distal anterior shin with dressing applied. Deferred utilizing prosthesis to avoid delaying wound healing and/or worsening the skin integrity. Pt engaged in LLE exercises to maintain AROM and prevent deconditioning. Provided him with a handout (Medbridge Access Code: [email or web address]). Pt declined to engage in bed mobility or transfers. Educated pt on the importance of daily mobility and provided max encouragement with the support of his wife. Pt continued to decline. Encouraged him to get to recliner chair by scooting later today with nursing and/or mobility. Will continue to follow acutely and advance appropriately.      If plan is discharge home, recommend the following: A little help with walking and/or transfers;A little help with bathing/dressing/bathroom;Assistance with cooking/housework;Assist for transportation;Help with stairs or ramp for entrance   Can travel by private vehicle        Equipment Recommendations  Hospital bed;Rolling walker (2 wheels);BSC/3in1    Recommendations for Other Services       Precautions / Restrictions Precautions Precautions: Fall Recall of Precautions/Restrictions: Intact Required Braces or Orthoses: Other  Brace Other Brace: L limb protector (Pt declined to wear d/t discomfort and tenderness on L residual limb) Restrictions Weight Bearing Restrictions Per Provider Order: Yes LLE Weight Bearing Per Provider Order: Non weight bearing     Mobility  Bed Mobility               General bed mobility comments: Pt declined to sit EOB, engaging in scooting activity, or participate in repositioning higher up in bed and/or with sheets and pad fixed. Provided max encouragement with wife present and supportive of pt to move OOB. He continued to decline.    Transfers                   General transfer comment: Pt declined to transfer to recliner chair despite encouragement and education on the benefits.    Ambulation/Gait               General Gait Details: Deferred d/t open wound on distal anterior R residual limb. Unable to apply prosthesis.   Stairs             Wheelchair Mobility     Tilt Bed    Modified Rankin (Stroke Patients Only)       Balance Overall balance assessment: Needs assistance (Session remained bed level)                                          Communication Communication Communication: No apparent difficulties  Cognition Arousal: Alert Behavior During Therapy: WFL for tasks assessed/performed, Anxious   PT - Cognitive impairments: No apparent  impairments                         Following commands: Intact      Cueing Cueing Techniques: Verbal cues, Gestural cues  Exercises Amputee Exercises Quad Sets: Supine, Left, AROM, 10 reps (with 5 second hold) Gluteal Sets: Supine, Left, AROM, 10 reps (with 5 second hold) Hip ABduction/ADduction: Supine, Left, AROM, 10 reps Knee Flexion: Supine, Left, AROM, 10 reps Straight Leg Raises: Supine, Left, AROM, 10 reps Other Exercises Other Exercises: Provided pt with HEP handout - Medbridge Access Code: [email or web address]    General Comments        Pertinent Vitals/Pain  Pain Assessment Pain Assessment: Faces Faces Pain Scale: Hurts little more Pain Location: L residual limb Pain Descriptors / Indicators: Discomfort, Aching Pain Intervention(s): Monitored during session, RN gave pain meds during session, Repositioned    Home Living                          Prior Function            PT Goals (current goals can now be found in the care plan section) Acute Rehab PT Goals Patient Stated Goal: Regain independence and return to work PT Goal Formulation: With patient Time For Goal Achievement: 09/06/24 Potential to Achieve Goals: Good Progress towards PT goals: Progressing toward goals    Frequency    Min 3X/week      PT Plan      Co-evaluation              AM-PAC PT 6 Clicks Mobility   Outcome Measure  Help needed turning from your back to your side while in a flat bed without using bedrails?: A Little Help needed moving from lying on your back to sitting on the side of a flat bed without using bedrails?: A Little Help needed moving to and from a bed to a chair (including a wheelchair)?: A Little Help needed standing up from a chair using your arms (e.g., wheelchair or bedside chair)?: Total Help needed to walk in hospital room?: Total Help needed climbing 3-5 steps with a railing? : Total 6 Click Score: 12    End of Session Equipment Utilized During Treatment: Gait belt Activity Tolerance: Patient tolerated treatment well;Other (comment) (Treatment limited secondary to pt's willingness to participate) Patient left: in bed;with call bell/phone within reach;with family/visitor present Nurse Communication: Mobility status PT Visit Diagnosis: Other abnormalities of gait and mobility (R26.89);Difficulty in walking, not elsewhere classified (R26.2);Unsteadiness on feet (R26.81)     Time: 8981-8950 PT Time Calculation (min) (ACUTE ONLY): 31 min  Charges:    $Therapeutic Exercise: 23-37 mins PT General Charges $$  ACUTE PT VISIT: 1 Visit                     Randall SAUNDERS, PT, DPT Acute Rehabilitation Services Office: [phone] Secure Chat Preferred  Sean Carter 08/24/2024, 11:15 AM

## 2024-08-24 NOTE — Progress Notes (Signed)
 Daily Progress Note Intern Pager: [phone]  Patient name: Sean Carter Medical record number: [id number] Date of birth: [birth year] Age: 53 y.o. Gender: male  Primary Care Provider: Toma Matas, MD Consultants: Vasc Surg, ID Code Status: Full  Pt Overview and Major Events to Date:  - 11/3: Admitted, VVS consulted - 11/4: Left BKA performed; blood culture growing strep agalactiae - 11/5: ID consulted re: abx  Medical Decision Making:  Sean Carter is a 53 y.o. male with PMH/PSH of PAD s/p right BKA (08/2023), s/p left great toe amputation 2/2 gangrene; T2DM, HTN, HLD, restrictive lung disease, GERD. Admitted for left foot osteo now s/p left BKA with vascular following and plan for CIR to continue rehab.  Also septic on admission and found to have Streptococcus agalactiae bacteremia, currently being treated with IV antibiotics and ID following.  White count continues to improve and patient overall feeling improved.  Echo done yesterday unremarkable, unable to get spinal MRI given patient's severe claustrophobia. Assessment & Plan Sepsis due to Streptococcus agalactiae ([name]) Foot osteomyelitis, left s/p BKA ([name]) - Vascular surgery following, appreciate recs - ID consulted re: abx regimen - Antibiotics: IV Unasyn  (11/4- ), IV Penicillin  G (11/5- ) - IV Zosyn  and Vanc (11/3-11/4) - IV Unasyn  3 g and IV linezolid 600 mg (11/3) - Pain regimen: Will plan to circle-back to discuss with patient; continuing hydrocodone-acetaminophen  5 or 7.5-325 1-2 tablets every 4 hours as needed for now but given elevated LFTs will plan to switch to oxycodone  vs hydromorphone  only - STOP acetaminophen  in setting of elevated LFTs - Ketorolac  15 mg IV every 6 hours as needed - Bowel regimen: Discontinued Miralax , senna in setting of loose stools - Zofran  ODT 4 mg q8h prn for N/V - ASA 81 mg daily - Repeat blood cultures (drawn 11/5) with no growth < 24 hours - AM Labs: Lab holiday - Fall  precautions - PT/OT eval and treat; CIR placement pending Elevated LFTs AST of 117, ALT of 64 today.  Increased from initial labs drawn on admission.  Normal levels 3 months ago.  Negative hepatitis panel 08/2023. Suspect elevation in setting of Tylenol  use for pain control. - Adjusting pain medications as above - Will have continued monitoring of liver function in CIR; can consider further workup as indicated Hypokalemia K of 3.5 this am, stable from yesterday. - Continue to follow - Consider stopping hydrochlorothiazide  on discharge given low K during hospitalization Anemia Hgb of 9.9 today, stable from 9.5 yesterday. - Transfusion threshold of 7 Hyponatremia (Resolved: 08/24/2024) Na of 137 today, normalized. Chronic health problem T2DM: 25u glargine daily with moderate SSI + QHS (home insulin  glargine 40 units daily, Humalog  20 units TID) HTN: Restart home Amlodipine  10 mg daily; holding hydrochlorothiazide  25 mg daily HLD: Continuing home Atorvastatin  40 mg daily Restrictive Lung Disease: Continuing home montelukast  10 mg daily, albuterol  prn GERD: Continuing home famotidine  40 mg BID  FEN/GI: Carb modified PPx: Lovenox  Dispo:CIR pending insurance authorization and continued antibiotic management.  Subjective:  Patient reports he continues to feel better than initial presentation.  Reports pain is well-controlled on current regimen.  Was able to get out of bed yesterday with PT, and is planning to go to CIR.  No N/V.  No objective fevers, though patient reports subjectively feeling warm and sweaty.  No other reported concerns or symptoms.  Objective: Temp:  [99.2 F (37.3 C)-99.9 F (37.7 C)] 99.3 F (37.4 C) (11/06 0731) Pulse Rate:  [76-85] 76 (11/06  9268) Resp:  [16-18] 17 (11/06 0731) BP: (150-155)/(74-78) 154/74 (11/06 0731) SpO2:  [92 %-95 %] 95 % (11/06 0731) Physical Exam: General: Patient sitting up in bed, getting ready to eat breakfast, no acute  distress. Cardiovascular: Regular rate and rhythm, no murmurs/rubs/gallops. Respiratory: Normal work of breathing on room air. Clear to auscultation bilaterally; no wheezes, crackles. Abdomen: Bowel sounds present and normoactive bilaterally. Soft, nondistended, nontender. Extremities: Hx right BKA, with bandage over stump site.  Left BKA, wrapped, no drainage through wrap. Derm: Skin warm, pink, sweaty.  No rash of visible skin.  Laboratory: Most recent CBC Lab Results  Component Value Date   WBC 10.9 (H) 08/24/2024   HGB 9.9 (L) 08/24/2024   HCT 30.5 (L) 08/24/2024   MCV 82.9 08/24/2024   PLT 337 08/24/2024   Most recent BMP    Latest Ref Rng & Units 08/24/2024    3:13 AM  BMP  Glucose 70 - 99 mg/dL 887   BUN 6 - 20 mg/dL 19   Creatinine 9.38 - 1.24 mg/dL 8.99   Sodium 864 - 854 mmol/L 137   Potassium 3.5 - 5.1 mmol/L 3.5   Chloride 98 - 111 mmol/L 96   CO2 22 - 32 mmol/L 28   Calcium  8.9 - 10.3 mg/dL 7.9    Hepatic Function Panel     Component Value Date/Time   PROT 6.3 (L) 08/24/2024 0313   PROT 7.4 05/03/2024 1039   ALBUMIN 1.9 (L) 08/24/2024 0313   ALBUMIN 4.8 05/03/2024 1039   AST 117 (H) 08/24/2024 0313   ALT 64 (H) 08/24/2024 0313   ALKPHOS 140 (H) 08/24/2024 0313   BILITOT 0.6 08/24/2024 0313   BILITOT 0.5 05/03/2024 1039    Imaging/Diagnostic Tests: Echo 11/5: LVEF 60-65%.  Left ventricular diastolic parameters indeterminate.  Normal right ventricular systolic function.  Normal pulmonary artery systolic pressure.  No significant valve disease.  Larraine Palma, MD 08/24/2024, 8:58 AM  PGY-1, [facility] FPTS Intern pager: [phone], text pages welcome Secure chat group [facility] Teaching Service

## 2024-08-24 NOTE — Progress Notes (Signed)
 Report called to receiving nurse Aleck Ebbs, RN on . Nurse reporting patient room still being cleaned and she will call when ready to receive patient.

## 2024-08-24 NOTE — Progress Notes (Signed)
 IP rehab admissions - We have approval for inpatient rehab admission for today.  I have contacted attending MD and will await their response.  We do have a bed available on CIR today.  [phone]

## 2024-08-24 NOTE — Assessment & Plan Note (Addendum)
-   Vascular surgery following, appreciate recs - ID consulted re: abx regimen - Antibiotics: IV Unasyn  (11/4- ), IV Penicillin  G (11/5- ) - IV Zosyn  and Vanc (11/3-11/4) - IV Unasyn  3 g and IV linezolid 600 mg (11/3) - Pain regimen: Will plan to circle-back to discuss with patient; continuing hydrocodone-acetaminophen  5 or 7.5-325 1-2 tablets every 4 hours as needed for now but given elevated LFTs will plan to switch to oxycodone  vs hydromorphone  only - STOP acetaminophen  in setting of elevated LFTs - Ketorolac  15 mg IV every 6 hours as needed - Bowel regimen: Discontinued Miralax , senna in setting of loose stools - Zofran  ODT 4 mg q8h prn for N/V - ASA 81 mg daily - Repeat blood cultures (drawn 11/5) with no growth < 24 hours - AM Labs: Lab holiday - Fall precautions - PT/OT eval and treat; CIR placement pending

## 2024-08-24 NOTE — Assessment & Plan Note (Signed)
 Hgb of 9.9 today, stable from 9.5 yesterday. - Transfusion threshold of 7

## 2024-08-24 NOTE — Discharge Summary (Addendum)
 Family Medicine Teaching [facility] Discharge Summary  Patient name: Sean Carter Medical record number: [id number] Date of birth: [birth year] Age: 53 y.o. Gender: male Date of Admission: 08/21/2024  Date of Discharge: 08/24/24 Admitting Physician: Alan Flies, MD  Primary Care Provider: Howell Lunger, DO Consultants: Vasc Surg, ID   Indication for Hospitalization: Left foot osteomyelitis  Discharge Diagnoses/Problem List:  Principal Problem for Admission: Sepsis 2/2 streptococcus agalactiae Other Problems addressed during stay:  Principal Problem:   Sepsis due to Streptococcus agalactiae ([name]) Active Problems:   Hypertension associated with diabetes ([name])   Hypokalemia   Sepsis with streptococcus agalactiae ([name])   Elevated LFTs   Foot osteomyelitis, left s/p BKA ([name])   Anemia   Brief Hospital Course:  Sean Carter is a 53 y.o. year old with a history of PAD s/p right BKA (08/2023), s/p left great toe amputation 2/2 gangrene; T2DM, HTN, HLD, restrictive lung disease, GERD who presented with left foot wound and was admitted to the [facility] Medicine Teaching Service for osteomyelitis with sepsis.  Left foot osteomyelitis Sepsis with streptococcus agalactiae Patient with 3 weeks of foot pain, 1 week of fever prior to admission. Met sepsis criteria with fever, tachycardia, elevated white count of 19.7, and source of infection. In the ED, had a left foot Xray showing likely osteomyelitis of the medial cortex of the left navicular bone. Vascular surgery was consulted, and recommended left BKA which was done 08/22/24. For sepsis, s/p IV Unasyn  3 g and IV linezolid 600 mg (11/3) in the ED, then started on Zosyn  (11/3-11/4) and Vancomycin  (11/3-11/4) which was switched to Unasyn  on 11/4 after blood cultures grew streptococcus agalactiae. ID was consulted, who recommended starting IV penicillin  G (began 11/5). An echo showed no significant abnormalities. Unable to get MRI of spine  due to significant claustrophobia. Repeat blood cultures were drawn 11/5, which had not shown any growth at time of discharge. Given elevated LFTs, pain regimen switched from hydromorphone -acetaminophen  to tramadol  at discharge (patient with strong preference for no oxycodone , no morphine).  Elevated LFTs LFTs elevated to AST of 117, ALT of 64 on 11/6; increased from admission lab values. Has had elevation in past. Negative hepatitis panel 08/2023. Pain medications adjusted to exclude tylenol  as above.  AKI, Resolved Cr elevated to 1.36 on admission, suspected prerenal 2/2 poor po intake and N/V in setting of illness. Improved with fluids and had normalized by time of discharge.  Hypokalemia, Resolved K initially 3.0 on admission 11/3, suspected 2/2 poor po intake and N/V, repleted. Then had K of 2.7 on 11/4, repleted. Held hydrochlorothiazide . At time of discharge, had normalized.  Other chronic conditions were medically managed with home medications and formulary alternatives as necessary (T2DM, HTN, HLD, Restrictive Lung Disease, GERD).  PCP Follow-up Recommendations: Ensure follow-up with vascular surgery Follow hepatic function     Results/Tests Pending at Time of Discharge:  Unresulted Labs (From admission, onward)     Start     Ordered   Signed and Held  Comprehensive metabolic panel  Tomorrow morning,   R        Signed and Held   Signed and Held  Basic metabolic panel  Every Mon,Thu,   R      Signed and Held   Signed and Held  CBC with Differential/Platelet  Tomorrow morning,   R        Signed and Held   Signed and Held  CBC  (enoxaparin  (LOVENOX )    CrCl >/= 30  ml/min)  Every Mon,Thu,   R     Comments: Baseline for enoxaparin  therapy IF NOT ALREADY DRAWN.  Notify MD if PLT < 100 K.    Signed and Held             Disposition: CIR  Discharge Condition: Stable  Discharge Exam:  Vitals:   08/24/24 1259 08/24/24 1452  BP: 130/66 138/69  Pulse: 69 77  Resp:  17   Temp:    SpO2:  97%   See earlier progress note by Alan Flies for physical exam.  Significant Procedures:  - 11/4: Left BKA  Significant Labs and Imaging:   Blood Cultures Collected 11/3: Streptococcus agalactiae Blood Cultures Collected 11/5: No growth < 24 hours  Recent Labs  Lab 08/23/24 0311 08/24/24 0313  WBC 11.5* 10.9*  HGB 9.5* 9.9*  HCT 29.3* 30.5*  PLT 284 337   Recent Labs  Lab 08/22/24 1527 08/23/24 0311 08/24/24 0313  NA 136 133* 137  K 3.3* 3.5 3.5  CL 96* 96* 96*  CO2 27 27 28   GLUCOSE 163* 255* 112*  BUN 26* 25* 19  CREATININE 1.24 1.16 1.00  CALCIUM  7.9* 7.9* 7.9*  MG 2.3 2.2  --   ALKPHOS  --   --  140*  AST  --   --  117*  ALT  --   --  64*  ALBUMIN  --   --  1.9*    Left Foot Xray 11/3: Possible osteomyelitis of medial cortex of left navicular bone  Echo 11/5: LVEF 60-65%.  Left ventricular diastolic parameters indeterminate.  Normal right ventricular systolic function.  Normal pulmonary artery systolic pressure.  No significant valve disease.    Discharge Medications:  Allergies as of 08/24/2024       Reactions   Scallops [shellfish Allergy] Anaphylaxis        Medication List     STOP taking these medications    acetaminophen  500 MG tablet Commonly known as: TYLENOL    albuterol  108 (90 Base) MCG/ACT inhaler Commonly known as: VENTOLIN  HFA Replaced by: albuterol  (2.5 MG/3ML) 0.083% nebulizer solution   Basaglar  KwikPen 100 UNIT/ML   cetirizine  10 MG tablet Commonly known as: ZYRTEC    Dexcom G7 Sensor Misc   hydrochlorothiazide  25 MG tablet Commonly known as: HYDRODIURIL    ibuprofen  200 MG tablet Commonly known as: ADVIL    insulin  lispro 100 UNIT/ML KwikPen Commonly known as: HumaLOG  KwikPen   rOPINIRole  1 MG tablet Commonly known as: REQUIP        TAKE these medications    albuterol  (2.5 MG/3ML) 0.083% nebulizer solution Commonly known as: PROVENTIL  Inhale 3 mLs (2.5 mg total) into the lungs every 6  (six) hours as needed for wheezing or shortness of breath. Replaces: albuterol  108 (90 Base) MCG/ACT inhaler   amLODipine  10 MG tablet Commonly known as: NORVASC  TAKE 1 TABLET BY MOUTH AT BEDTIME What changed: when to take this   aspirin  EC 81 MG tablet Take 1 tablet (81 mg total) by mouth daily. Swallow whole. Start taking on: August 25, 2024   atorvastatin  40 MG tablet Commonly known as: LIPITOR Take 1 tablet by mouth once daily   famotidine  40 MG tablet Commonly known as: PEPCID  Take 1 tablet (40 mg total) by mouth 2 (two) times daily.   insulin  aspart 100 UNIT/ML injection Commonly known as: novoLOG  Inject 0-15 Units into the skin 3 (three) times daily with meals.   insulin  aspart 100 UNIT/ML injection Commonly known as: novoLOG  Inject 0-5 Units  into the skin at bedtime.   insulin  glargine-yfgn 100 UNIT/ML injection Commonly known as: SEMGLEE  Inject 0.25 mLs (25 Units total) into the skin daily.   montelukast  10 MG tablet Commonly known as: SINGULAIR  Take 1 tablet (10 mg total) by mouth at bedtime.   ondansetron  4 MG disintegrating tablet Commonly known as: ZOFRAN -ODT Take 1 tablet (4 mg total) by mouth every 8 (eight) hours as needed for nausea or vomiting.   penicillin  G potassium 24 Million Units in dextrose  5 % 500 mL Inject 24 Million Units into the vein daily. Start taking on: August 25, 2024   traMADol  50 MG tablet Commonly known as: Ultram  Take 1 tablet (50 mg total) by mouth 2 (two) times daily for 7 days.        Discharge Instructions: Please refer to Patient Instructions section of EMR for full details.  Patient was counseled important signs and symptoms that should prompt return to medical care, changes in medications, dietary instructions, activity restrictions, and follow up appointments.    Larraine Palma, MD 08/24/2024, 3:17 PM PGY-1, [facility]

## 2024-08-24 NOTE — Assessment & Plan Note (Addendum)
 T2DM: 25u glargine daily with moderate SSI + QHS (home insulin  glargine 40 units daily, Humalog  20 units TID) HTN: Restart home Amlodipine  10 mg daily; holding hydrochlorothiazide  25 mg daily HLD: Continuing home Atorvastatin  40 mg daily Restrictive Lung Disease: Continuing home montelukast  10 mg daily, albuterol  prn GERD: Continuing home famotidine  40 mg BID

## 2024-08-24 NOTE — Progress Notes (Signed)
 CSW spoke with pt regarding SDOH: Housing. Pt reports that he was having issues in the past paying his mortgage when he was out of work but that things have improved and this is no longer an issue.  CSW provided contact information to [employer] and [employer], discussed that these are potential resources if issues arise in the future.  Pt verbalized understanding. Cathlyn Ferry, MSW, LCSW 11/6/202511:37 AM

## 2024-08-24 NOTE — Progress Notes (Addendum)
  Progress Note    08/24/2024 7:51 AM 2 Days Post-Op  Subjective:  says pain is manageable with current regimen. Did well    Vitals:   08/24/24 0442 08/24/24 0731  BP: (!) 155/76 (!) 154/74  Pulse: 81 76  Resp: 18 17  Temp: 99.9 F (37.7 C) 99.3 F (37.4 C)  SpO2: 92% 95%   Physical Exam: Cardiac:  regular Lungs:  non labored Incisions:  Left BKA staples intact, no active bleeding; flaps appear viable, no appreciable fluid collections; skin somewhat macerated along staple line. Dry 4x4s, Kerlix and ACE applied  Neurologic: alert and oriented  CBC    Component Value Date/Time   WBC 10.9 (H) 08/24/2024 0313   RBC 3.68 (L) 08/24/2024 0313   HGB 9.9 (L) 08/24/2024 0313   HGB 15.0 05/03/2024 1039   HCT 30.5 (L) 08/24/2024 0313   HCT 45.7 05/03/2024 1039   PLT 337 08/24/2024 0313   PLT 327 05/03/2024 1039   MCV 82.9 08/24/2024 0313   MCV 86 05/03/2024 1039   MCH 26.9 08/24/2024 0313   MCHC 32.5 08/24/2024 0313   RDW 13.6 08/24/2024 0313   RDW 13.3 05/03/2024 1039   LYMPHSABS 0.7 08/21/2024 1308   LYMPHSABS 2.3 05/03/2024 1039   MONOABS 1.1 (H) 08/21/2024 1308   EOSABS 0.0 08/21/2024 1308   EOSABS 0.2 05/03/2024 1039   BASOSABS 0.0 08/21/2024 1308   BASOSABS 0.0 05/03/2024 1039    BMET    Component Value Date/Time   NA 137 08/24/2024 0313   NA 138 05/03/2024 1039   K 3.5 08/24/2024 0313   CL 96 (L) 08/24/2024 0313   CO2 28 08/24/2024 0313   GLUCOSE 112 (H) 08/24/2024 0313   BUN 19 08/24/2024 0313   BUN 21 05/03/2024 1039   CREATININE 1.00 08/24/2024 0313   CREATININE 0.78 07/10/2020 0910   CALCIUM  7.9 (L) 08/24/2024 0313   GFRNONAA >60 08/24/2024 0313   GFRNONAA 106 07/10/2020 0910   GFRAA 123 07/10/2020 0910    INR    Component Value Date/Time   INR 1.2 08/21/2024 1308     Intake/Output Summary (Last 24 hours) at 08/24/2024 0751 Last data filed at 08/23/2024 2300 Gross per 24 hour  Intake 610.47 ml  Output 450 ml  Net 160.47 ml      Assessment/Plan:  53 y.o. male is s/p L BKA 2 Days Post-Op   Left BKA intact. Viable flaps. Dry 4x4s, kerlix and ACE applied Pain overall manageable Continue to work with therapy teams He should be a good candidate for CIR  Teretha Damme, DEVONNA [facility] [phone] 08/24/2024 7:51 AM  I have independently interviewed and examined patient and agree with PA assessment and plan above.  Progressing as expected and okay for rehab from a vascular surgery standpoint.  Collier Bohnet C. Sheree, MD [facility] [place] Office: [phone] Pager: [phone]

## 2024-08-24 NOTE — H&P (Signed)
 Physical Medicine and Rehabilitation Admission H&P    Chief Complaint  Patient presents with   Functional deficits due to L-BKA. History of R-BKA   HPI:  Sean Carter is a 53 year old male with history of T2DM (A1c 6.5) with diabetic retinopathy, PAD s/p multiple R-BKA 08/2023 after multiple procedures and developed left foot pain for 3 weeks progressing to fever and chills 2-3 days PTA on 08/21/24. He was found to have gangrenous changes of most of his foot with malodor which was felt to be non-salvageable. He was started on IV Vanc/Zosyn  and underwent L-BKA on 11/04 by Dr. Sheree. Blood cultures done showed Group B strep and antibiotics changed to PCN with recommendations to rule out diskitis/OM of spine and TTE. He was unable to tolerate MRI (and refuses to get it done under anesthesia) and 2D echo done revealing EF 55-60% and mild thickening of AV/MV.   Leucocytosis resolving with WBC down form 19.7-->10.9 and ABLA noted. PT/OT consulted and patient requires CGA for ADLs and mobility.  He was working PTA and CIR recommended due to functional decline.   Pt reports will NEVER get an MRI again even under anaesthesia  Pt figured out with nursing today that IV was leaking and might not have received all his ABX yesterday.  Doesn't want Oxy - doesn't like how it makes him feel- willing to do Tramadol , since LFT's increasing- Cannot stay on Norco due to tylenol .   LBM this AM; voiding well.  Pain is 8/10- last got pain meds 2 hours ago- Is  a little rough.  Chronic hip pain as well.   Review of Systems  HENT:  Negative for hearing loss and tinnitus.   Eyes:  Positive for blurred vision (diabetic retinopathy--gets shots monthly). Negative for double vision.  Respiratory:  Positive for cough (since 2021).   Cardiovascular:  Negative for chest pain and palpitations.  Gastrointestinal:  Negative for constipation, heartburn and nausea.  Genitourinary:  Negative for dysuria, frequency and  urgency.  Musculoskeletal:  Positive for back pain (sharp pain left hip--since 2021). Negative for myalgias and neck pain.  Neurological:  Negative for dizziness, tingling, sensory change and headaches.  Psychiatric/Behavioral:  The patient is not nervous/anxious and does not have insomnia.   All other systems reviewed and are negative.    Past Medical History:  Diagnosis Date   Blurry vision 07/10/2020   Diabetes mellitus without complication ([name])    Gangrene of toe of left foot ([name])    Peripheral arterial disease    Sepsis due to Streptococcus, group B ([name]) 06/20/2020   Streptococcal bacteremia     Past Surgical History:  Procedure Laterality Date   ABDOMINAL AORTOGRAM W/LOWER EXTREMITY N/A 01/18/2023   Procedure: ABDOMINAL AORTOGRAM W/LOWER EXTREMITY;  Surgeon: Eliza Lonni RAMAN, MD;  Location[facility] INVASIVE CV LAB;  Service: Cardiovascular;  Laterality: N/A;   AMPUTATION Left 05/31/2020   Procedure: Ray Amputation of Left Great Toe with Negative Pressure Vac Placement;  Surgeon: Gretta Lonni PARAS, MD;  Location: [facility] OR;  Service: Vascular;  Laterality: Left;   AMPUTATION Right 07/22/2022   Procedure: RIGHT FIRST AND SECOND RAY PARTIAL  AMPUTATION;  Surgeon: Magda Debby SAILOR, MD;  Location: [facility] OR;  Service: Vascular;  Laterality: Right;   AMPUTATION Right 09/02/2023   Procedure: AMPUTATION BELOW KNEE;  Surgeon: Serene Gaile ORN, MD;  Location: [facility] OR;  Service: Vascular;  Laterality: Right;   AMPUTATION Left 08/22/2024   Procedure: LEFT AMPUTATION BELOW KNEE;  Surgeon: Sheree Penne Bruckner, MD;  Location: [facility] OR;  Service: Vascular;  Laterality: Left;   APPLICATION OF WOUND VAC Right 01/22/2023   Procedure: APPLICATION OF WOUND VAC, RIGHT FOOT;  Surgeon: Sheree Penne Bruckner, MD;  Location: [facility] OR;  Service: Vascular;  Laterality: Right;   INCISION AND DRAINAGE OF WOUND Right 01/22/2023   Procedure: WASHOUT OF RIGHT FOOT WOUND;  Surgeon: Sheree Penne Bruckner, MD;   Location: [facility] OR;  Service: Vascular;  Laterality: Right;   PERIPHERAL VASCULAR BALLOON ANGIOPLASTY  01/18/2023   Procedure: PERIPHERAL VASCULAR BALLOON ANGIOPLASTY;  Surgeon: Eliza Bruckner RAMAN, MD;  Location: [facility] INVASIVE CV LAB;  Service: Cardiovascular;;   STUMP REVISION Right 01/22/2023   Procedure: PARTIAL CLOSURE OF RIGHT FOOT WOUND;  Surgeon: Sheree Penne Bruckner, MD;  Location: [facility] OR;  Service: Vascular;  Laterality: Right;   TRANSMETATARSAL AMPUTATION Right 01/19/2023   Procedure: TRANSMETATARSAL AMPUTATION;  Surgeon: Sheree Penne Bruckner, MD;  Location: [facility] OR;  Service: Vascular;  Laterality: Right;    Family History  Problem Relation Age of Onset   Diabetes Father     Social History:  Married. Independent and was working as a Actuary. He reports that he has never smoked. He has never been exposed to tobacco smoke. He has never used smokeless tobacco. He reports that he does not use drugs. No history on file for alcohol  use.   Allergies  Allergen Reactions   Scallops [Shellfish Allergy] Anaphylaxis    Medications Prior to Admission  Medication Sig Dispense Refill   acetaminophen  (TYLENOL ) 500 MG tablet Take 1,000 mg by mouth 2 (two) times daily as needed for headache or fever (pain).     amLODipine  (NORVASC ) 10 MG tablet TAKE 1 TABLET BY MOUTH AT BEDTIME (Patient taking differently: Take 10 mg by mouth daily.) 60 tablet 2   atorvastatin  (LIPITOR) 40 MG tablet Take 1 tablet by mouth once daily 90 tablet 2   cetirizine  (ZYRTEC ) 10 MG tablet Take 1 tablet by mouth once daily (Patient taking differently: Take 10 mg by mouth at bedtime.) 30 tablet 10   Continuous Glucose Sensor (DEXCOM G7 SENSOR) MISC 3 Devices by Does not apply route as directed. Apply new sensor every 10 days 3 each 11   famotidine  (PEPCID ) 40 MG tablet Take 1 tablet (40 mg total) by mouth 2 (two) times daily. 60 tablet 2   hydrochlorothiazide  (HYDRODIURIL ) 25 MG tablet Take 1 tablet (25 mg total) by  mouth daily. 90 tablet 3   ibuprofen  (ADVIL ) 200 MG tablet Take 400 mg by mouth 2 (two) times daily as needed for headache (pain).     Insulin  Glargine (BASAGLAR  KWIKPEN) 100 UNIT/ML Inject 40 Units into the skin daily. 36 mL 3   insulin  lispro (HUMALOG  KWIKPEN) 100 UNIT/ML KwikPen Inject 20 Units into the skin with breakfast, with lunch, and with evening meal. (Patient taking differently: Inject 20 Units into the skin 2 (two) times daily before a meal.) 54 mL 3   montelukast  (SINGULAIR ) 10 MG tablet Take 1 tablet (10 mg total) by mouth at bedtime. 90 tablet 1   rOPINIRole  (REQUIP ) 1 MG tablet TAKE 1 TABLET BY MOUTH AT BEDTIME (Patient taking differently: Take 1 mg by mouth at bedtime as needed (restless legs).) 30 tablet 2    Home: Home Living Family/patient expects to be discharged to:: Private residence Living Arrangements: Spouse/significant other Available Help at Discharge: Family, Available 24 hours/day Type of Home: House (townhome) Home Access: Level entry Home Layout: Two level, 1/2 bath  on main level, Bed/bath upstairs Alternate Level Stairs-Number of Steps: 16 Alternate Level Stairs-Rails: Right Bathroom Shower/Tub: Psychologist, counselling, Engineer, Manufacturing Systems: Standard Bathroom Accessibility: No Home Equipment: Wheelchair - manual, Rollator (4 wheels), Grab bars - tub/shower, Shower seat - built in   Functional History: Prior Function Prior Level of Function : Independent/Modified Independent, Driving, Working/employed Mobility Comments: Ambulates without AD but using R BKA prosthesis. 1 fall at work. ADLs Comments: Indep with ADLs/IADLs. He was working as a investment banker, operational, typically ~90hrs/wk. Now transitioned to [place] and will only work a max of ~50hrs/wk. Pt reports he was just begining to learn how to drive an adapted vehicle.  Functional Status:  Mobility: Bed Mobility Overal bed mobility: Needs Assistance Bed Mobility: Supine to Sit Supine to sit: Contact guard, Used  rails, HOB elevated General bed mobility comments: Pt declined to sit EOB, engaging in scooting activity, or participate in repositioning higher up in bed and/or with sheets and pad fixed. Provided max encouragement with wife present and supportive of pt to move OOB. He continued to decline. Transfers Overall transfer level: Needs assistance Equipment used: None Transfers: Bed to chair/wheelchair/BSC Bed to/from chair/wheelchair/BSC transfer type:: Lateral/scoot transfer  Lateral/Scoot Transfers: Contact guard assist General transfer comment: Pt declined to transfer to recliner chair despite encouragement and education on the benefits. Ambulation/Gait General Gait Details: Deferred d/t open wound on distal anterior R residual limb. Unable to apply prosthesis.    ADL: ADL Overall ADL's : Needs assistance/impaired Eating/Feeding: Independent, Sitting Grooming: Set up, Sitting Upper Body Bathing: Set up, Sitting Lower Body Bathing: Minimal assistance, Sitting/lateral leans Upper Body Dressing : Set up, Sitting Lower Body Dressing: Minimal assistance, Sitting/lateral leans Toilet Transfer: Contact guard assist, Transfer board  Cognition: Cognition Orientation Level: Oriented X4 Cognition Arousal: Alert Behavior During Therapy: WFL for tasks assessed/performed, Anxious  Physical Exam: Blood pressure 130/66, pulse 69, temperature 99.3 F (37.4 C), temperature source Oral, resp. rate 17, height 5' 7 (1.702 m), weight 99.8 kg, SpO2 95%. Physical Exam Vitals and nursing note reviewed. Exam conducted with a chaperone present.  Constitutional:      General: He is not in acute distress.    Appearance: Normal appearance. He is obese.     Comments: Pt sitting up in bed; FM resident and nurse in room; as well as PA and pt and myself Pt awake, alert, appropriate, but kept closing eyes- closed ~ 75-80% of interview/exam, NAD  HENT:     Head: Normocephalic and atraumatic.     Right Ear:  External ear normal.     Left Ear: External ear normal.     Nose: Nose normal. No congestion.     Mouth/Throat:     Mouth: Mucous membranes are moist.     Pharynx: No oropharyngeal exudate.  Eyes:     General:        Right eye: No discharge.        Left eye: No discharge.     Extraocular Movements: Extraocular movements intact.  Cardiovascular:     Rate and Rhythm: Normal rate and regular rhythm.     Heart sounds: Normal heart sounds. No murmur heard.    No gallop.  Pulmonary:     Effort: Pulmonary effort is normal. No respiratory distress.     Breath sounds: Normal breath sounds. No wheezing, rhonchi or rales.  Abdominal:     Palpations: Abdomen is soft.     Comments: Very protuberant; non distended; normoactive; Soft  Musculoskeletal:  Cervical back: Neck supple. No tenderness.     Comments: Ue's 5/5 throughout B/L RLE_ HF/KE/KF 5/5- has R BKA- with scabs on R BKA from standing 9-12 hours/day at work LLE- HF/KE/KF at least 4/5- bulbous L BKA  Skin:    General: Skin is warm.     Comments: A couple of open and abraded areas on R-BKA. L-BKA with dry dressing.  Per last pic- has oozing from incision- dog ears (+) and bulbous L Forearm IV- not leaking anymore- constantly on IV Pen G  Neurological:     Mental Status: He is alert and oriented to person, place, and time.     Comments: Intact to light touch in all 4 extremities Ox3- keeps eyes closed due to fuzziness  Psychiatric:        Mood and Affect: Mood normal.        Behavior: Behavior normal.     Results for orders placed or performed during the hospital encounter of 08/21/24 (from the past 48 hours)  Basic metabolic panel     Status: Abnormal   Collection Time: 08/22/24  3:27 PM  Result Value Ref Range   Sodium 136 135 - 145 mmol/L   Potassium 3.3 (L) 3.5 - 5.1 mmol/L   Chloride 96 (L) 98 - 111 mmol/L   CO2 27 22 - 32 mmol/L   Glucose, Bld 163 (H) 70 - 99 mg/dL    Comment: Glucose reference range applies only to  samples taken after fasting for at least 8 hours.   BUN 26 (H) 6 - 20 mg/dL   Creatinine, Ser 8.75 0.61 - 1.24 mg/dL   Calcium  7.9 (L) 8.9 - 10.3 mg/dL   GFR, Estimated >39 >39 mL/min    Comment: (NOTE) Calculated using the CKD-EPI Creatinine Equation (2021)    Anion gap 13 5 - 15    Comment: Performed at [facility] Lab, [address], [place], [place] [address]  Magnesium     Status: None   Collection Time: 08/22/24  3:27 PM  Result Value Ref Range   Magnesium 2.3 1.7 - 2.4 mg/dL    Comment: Performed at [facility] Lab, [address] [place], [place] [address]  Glucose, capillary     Status: Abnormal   Collection Time: 08/22/24  5:38 PM  Result Value Ref Range   Glucose-Capillary 141 (H) 70 - 99 mg/dL    Comment: Glucose reference range applies only to samples taken after fasting for at least 8 hours.  Glucose, capillary     Status: Abnormal   Collection Time: 08/22/24 10:06 PM  Result Value Ref Range   Glucose-Capillary 242 (H) 70 - 99 mg/dL    Comment: Glucose reference range applies only to samples taken after fasting for at least 8 hours.  CBC     Status: Abnormal   Collection Time: 08/23/24  3:11 AM  Result Value Ref Range   WBC 11.5 (H) 4.0 - 10.5 K/uL   RBC 3.54 (L) 4.22 - 5.81 MIL/uL   Hemoglobin 9.5 (L) 13.0 - 17.0 g/dL   HCT 70.6 (L) 60.9 - 47.9 %   MCV 82.8 80.0 - 100.0 fL   MCH 26.8 26.0 - 34.0 pg   MCHC 32.4 30.0 - 36.0 g/dL   RDW 86.1 88.4 - 84.4 %   Platelets 284 150 - 400 K/uL   nRBC 0.0 0.0 - 0.2 %    Comment: Performed at [facility] Lab, [address] [place], [place] [address]  Basic metabolic panel     Status: Abnormal   Collection Time: 08/23/24  3:11 AM  Result Value Ref Range   Sodium 133 (L) 135 - 145 mmol/L   Potassium 3.5 3.5 - 5.1 mmol/L   Chloride 96 (L) 98 - 111 mmol/L   CO2 27 22 - 32 mmol/L   Glucose, Bld 255 (H) 70 - 99 mg/dL    Comment: Glucose reference range applies only to samples taken after fasting for at least 8  hours.   BUN 25 (H) 6 - 20 mg/dL   Creatinine, Ser 8.83 0.61 - 1.24 mg/dL   Calcium  7.9 (L) 8.9 - 10.3 mg/dL   GFR, Estimated >39 >39 mL/min    Comment: (NOTE) Calculated using the CKD-EPI Creatinine Equation (2021)    Anion gap 10 5 - 15    Comment: Performed at [facility] Lab, [address], [place], [place] [address]  Magnesium     Status: None   Collection Time: 08/23/24  3:11 AM  Result Value Ref Range   Magnesium 2.2 1.7 - 2.4 mg/dL    Comment: Performed at [facility] Lab, [address] [place], [place] [address]  Glucose, capillary     Status: Abnormal   Collection Time: 08/23/24  5:45 AM  Result Value Ref Range   Glucose-Capillary 219 (H) 70 - 99 mg/dL    Comment: Glucose reference range applies only to samples taken after fasting for at least 8 hours.  Glucose, capillary     Status: Abnormal   Collection Time: 08/23/24  9:30 AM  Result Value Ref Range   Glucose-Capillary 219 (H) 70 - 99 mg/dL    Comment: Glucose reference range applies only to samples taken after fasting for at least 8 hours.  Sedimentation rate     Status: Abnormal   Collection Time: 08/23/24 11:00 AM  Result Value Ref Range   Sed Rate 122 (H) 0 - 16 mm/hr    Comment: Performed at [facility] Lab, [address], [place], [place] [address]  C-reactive protein     Status: Abnormal   Collection Time: 08/23/24 11:00 AM  Result Value Ref Range   CRP 31.3 (H) <1.0 mg/dL    Comment: Performed at [facility] Lab, [address], [place], [place] [address]  Culture, blood (Routine X 2) w Reflex to ID Panel     Status: None (Preliminary result)   Collection Time: 08/23/24 11:00 AM   Specimen: BLOOD RIGHT HAND  Result Value Ref Range   Specimen Description BLOOD RIGHT HAND    Special Requests      AEROBIC BOTTLE ONLY Blood Culture results may not be optimal due to an inadequate volume of blood received in culture bottles   Culture      NO GROWTH < 24 HOURS Performed at [facility]  Lab, [address] [place], [place] [address]    Report Status PENDING   Culture, blood (Routine X 2) w Reflex to ID Panel     Status: None (Preliminary result)   Collection Time: 08/23/24 11:00 AM   Specimen: BLOOD LEFT ARM  Result Value Ref Range   Specimen Description BLOOD LEFT ARM    Special Requests      BOTTLES DRAWN AEROBIC AND ANAEROBIC Blood Culture adequate volume   Culture      NO GROWTH < 24 HOURS Performed at [facility] Lab, [address] [place], [place] [address]    Report Status  PENDING   Glucose, capillary     Status: Abnormal   Collection Time: 08/23/24 11:38 AM  Result Value Ref Range   Glucose-Capillary 199 (H) 70 - 99 mg/dL    Comment: Glucose reference range applies only to samples taken after fasting for at least 8 hours.  Glucose, capillary     Status: Abnormal   Collection Time: 08/23/24  4:27 PM  Result Value Ref Range   Glucose-Capillary 130 (H) 70 - 99 mg/dL    Comment: Glucose reference range applies only to samples taken after fasting for at least 8 hours.  Glucose, capillary     Status: Abnormal   Collection Time: 08/23/24  8:44 PM  Result Value Ref Range   Glucose-Capillary 111 (H) 70 - 99 mg/dL    Comment: Glucose reference range applies only to samples taken after fasting for at least 8 hours.  CBC     Status: Abnormal   Collection Time: 08/24/24  3:13 AM  Result Value Ref Range   WBC 10.9 (H) 4.0 - 10.5 K/uL   RBC 3.68 (L) 4.22 - 5.81 MIL/uL   Hemoglobin 9.9 (L) 13.0 - 17.0 g/dL   HCT 69.4 (L) 60.9 - 47.9 %   MCV 82.9 80.0 - 100.0 fL   MCH 26.9 26.0 - 34.0 pg   MCHC 32.5 30.0 - 36.0 g/dL   RDW 86.3 88.4 - 84.4 %   Platelets 337 150 - 400 K/uL   nRBC 0.0 0.0 - 0.2 %    Comment: Performed at [facility] Lab, [address] [place], [place] [address]  Comprehensive metabolic panel     Status: Abnormal   Collection Time: 08/24/24  3:13 AM  Result Value Ref Range   Sodium 137 135 - 145 mmol/L   Potassium 3.5 3.5 - 5.1 mmol/L    Chloride 96 (L) 98 - 111 mmol/L   CO2 28 22 - 32 mmol/L   Glucose, Bld 112 (H) 70 - 99 mg/dL    Comment: Glucose reference range applies only to samples taken after fasting for at least 8 hours.   BUN 19 6 - 20 mg/dL   Creatinine, Ser 8.99 0.61 - 1.24 mg/dL   Calcium  7.9 (L) 8.9 - 10.3 mg/dL   Total Protein 6.3 (L) 6.5 - 8.1 g/dL   Albumin 1.9 (L) 3.5 - 5.0 g/dL   AST 882 (H) 15 - 41 U/L   ALT 64 (H) 0 - 44 U/L   Alkaline Phosphatase 140 (H) 38 - 126 U/L   Total Bilirubin 0.6 0.0 - 1.2 mg/dL   GFR, Estimated >39 >39 mL/min    Comment: (NOTE) Calculated using the CKD-EPI Creatinine Equation (2021)    Anion gap 13 5 - 15    Comment: Performed at [facility] Lab, [address], [place], [place] [address]  Glucose, capillary     Status: Abnormal   Collection Time: 08/24/24  9:31 AM  Result Value Ref Range   Glucose-Capillary 172 (H) 70 - 99 mg/dL    Comment: Glucose reference range applies only to samples taken after fasting for at least 8 hours.  Glucose, capillary     Status: Abnormal   Collection Time: 08/24/24 11:11 AM  Result Value Ref Range   Glucose-Capillary 174 (H) 70 - 99 mg/dL    Comment: Glucose reference range applies only to samples taken after fasting for at least 8 hours.   MR THORACIC SPINE WO CONTRAST Result Date: 08/23/2024 EXAM: MR Thoracic Spine without  08/23/2024 07:52:26 PM TECHNIQUE: Multiplanar multisequence MRI of the thoracic spine was performed without the administration of intravenous contrast. COMPARISON: None available CLINICAL HISTORY: Back pain and bacteremia, concern for discitis. FINDINGS: Incomplete study due to termination secondary to patient claustrophobia. Only sagittal T2 and STIR performed. BONES AND ALIGNMENT: Normal alignment. Normal vertebral body heights. At T12-L1, endplate signal changes that are largely STIR hypointense without definite/obvious disc edema. This level is incompletely imaged. No marrow or disc edema at other levels. SPINAL  CORD: Normal spinal cord volume. Normal spinal cord signal. SOFT TISSUES: Unremarkable on limited assessment. DEGENERATIVE CHANGES: Limited evaluation without axial. Multiple small disc protrusions without high grade canal or foraminal stenosis. IMPRESSION: 1. Incomplete study with early termination secondary to patient claustrophobia. Only sagittal T2 and STIR performed. 2. At T12-L1, endplate signal changes which appear degenerative but are incompletely imaged. To fully exclude discitis, recommend completing MRI of the lumbar spine (preferably with contrast) if the patient is able. 3. No obvious evidence of discitis/osteomyelitis at other thoracic levels. 4. No high-grade canal or foraminal stenosis in the thoracic spine. Electronically signed by: Gilmore Molt MD 08/23/2024 08:32 PM EST RP Workstation: [id number]   ECHOCARDIOGRAM COMPLETE Result Date: 08/23/2024    ECHOCARDIOGRAM REPORT   Patient Name:   AMMAR MOFFATT Date of Exam: 08/23/2024 Medical Rec #:  [id number]     Height:       67.0 in Accession #:    [id number]    Weight:       220.0 lb Date of Birth:  Sep 16, 1971    BSA:          2.106 m Patient Age:    52 years      BP:           143/73 mmHg Patient Gender: M             HR:           77 bpm. Exam Location:  Inpatient Procedure: 2D Echo (Both Spectral and Color Flow Doppler were utilized during            procedure). Indications:    Bacteremia  History:        Patient has prior history of Echocardiogram examinations. Risk                 Factors:Hypertension.  Sonographer:    Charmaine Gaskins Referring Phys: COREAN LOISE FIREMAN IMPRESSIONS  1. Left ventricular ejection fraction, by estimation, is 60 to 65%. The left ventricle has normal function. The left ventricle has no regional wall motion abnormalities. Left ventricular diastolic parameters are indeterminate.  2. Right ventricular systolic function is normal. The right ventricular size is normal. There is normal pulmonary artery systolic pressure.   3. The mitral valve is normal in structure. Trivial mitral valve regurgitation. No evidence of mitral stenosis.  4. Aortic valve abnormalities are unchanged compared with the echo 05/2020. The aortic valve is tricuspid. There is mild calcification of the aortic valve. There is mild thickening of the aortic valve. Aortic valve regurgitation is not visualized. No aortic stenosis is present.  5. The inferior vena cava is normal in size with greater than 50% respiratory variability, suggesting right atrial pressure of 3 mmHg. FINDINGS  Left Ventricle: Left ventricular ejection fraction, by estimation, is 60 to 65%. The left ventricle has normal function. The left ventricle has no regional wall motion abnormalities. The left ventricular internal cavity size was normal in size. There is  no left ventricular hypertrophy.  Left ventricular diastolic parameters are indeterminate. Indeterminate filling pressures. Right Ventricle: The right ventricular size is normal. No increase in right ventricular wall thickness. Right ventricular systolic function is normal. There is normal pulmonary artery systolic pressure. The tricuspid regurgitant velocity is 2.25 m/s, and  with an assumed right atrial pressure of 3 mmHg, the estimated right ventricular systolic pressure is 23.2 mmHg. Left Atrium: Left atrial size was normal in size. Right Atrium: Right atrial size was normal in size. Pericardium: There is no evidence of pericardial effusion. Mitral Valve: The mitral valve is normal in structure. There is mild thickening of the mitral valve leaflet(s). Trivial mitral valve regurgitation. No evidence of mitral valve stenosis. Tricuspid Valve: The tricuspid valve is normal in structure. Tricuspid valve regurgitation is mild . No evidence of tricuspid stenosis. Aortic Valve: Aortic valve abnormalities are unchanged compared with the echo 05/2020. The aortic valve is tricuspid. There is mild calcification of the aortic valve. There is mild  thickening of the aortic valve. Aortic valve regurgitation is not visualized. No aortic stenosis is present. Pulmonic Valve: The pulmonic valve was normal in structure. Pulmonic valve regurgitation is not visualized. No evidence of pulmonic stenosis. Aorta: The aortic root is normal in size and structure. Venous: The inferior vena cava is normal in size with greater than 50% respiratory variability, suggesting right atrial pressure of 3 mmHg. IAS/Shunts: No atrial level shunt detected by color flow Doppler.  LEFT VENTRICLE PLAX 2D LVIDd:         5.00 cm   Diastology LVIDs:         2.80 cm   LV e' medial:    7.29 cm/s LV PW:         0.90 cm   LV E/e' medial:  14.5 LV IVS:        0.90 cm   LV e' lateral:   11.30 cm/s LVOT diam:     2.00 cm   LV E/e' lateral: 9.4 LVOT Area:     3.14 cm  RIGHT VENTRICLE RV Basal diam:  2.40 cm RV Mid diam:    3.00 cm RV S prime:     12.10 cm/s LEFT ATRIUM             Index        RIGHT ATRIUM           Index LA diam:        3.80 cm 1.80 cm/m   RA Area:     10.30 cm LA Vol (A2C):   61.5 ml 29.20 ml/m  RA Volume:   17.80 ml  8.45 ml/m LA Vol (A4C):   40.7 ml 19.33 ml/m LA Biplane Vol: 53.7 ml 25.50 ml/m   AORTA Ao Root diam: 3.00 cm Ao Asc diam:  3.30 cm MITRAL VALVE                TRICUSPID VALVE MV Area (PHT): 6.02 cm     TR Peak grad:   20.2 mmHg MV Decel Time: 126 msec     TR Vmax:        225.00 cm/s MV E velocity: 106.00 cm/s MV A velocity: 85.40 cm/s   SHUNTS MV E/A ratio:  1.24         Systemic Diam: 2.00 cm Annabella Scarce MD Electronically signed by Annabella Scarce MD Signature Date/Time: 08/23/2024/5:34:12 PM    Final       Blood pressure 130/66, pulse 69, temperature 99.3 F (37.4 C), temperature source Oral,  resp. rate 17, height 5' 7 (1.702 m), weight 99.8 kg, SpO2 95%.  Medical Problem List and Plan: 1. Functional deficits secondary to L BKA in setting of old R BKA due to osteomyelitis and gangrene  -patient may not shower  -ELOS/Goals: 7-10 days  supervision to mod I at w/c level Admit to CIR- appropriate 2.  Antithrombotics: -DVT/anticoagulation:  Pharmaceutical: Lovenox   -antiplatelet therapy: ASA 3. Pain Management: Hydrocodone--->changed to tramadol  prn.  --Ketorolac  X 5 days thru thru 11/08. Monitor renal status/BP stopped Norco due to LFT elevation 4. Mood/Behavior/Sleep: LCSW to follow for evaluation and support.   -antipsychotic agents: N/A 5. Neuropsych/cognition: This patient is capable of making decisions on his own behalf. 6. Skin/Wound Care: Routine pressure relief measures.  --Low carb protein supplements for wound healing.  7. Fluids/Electrolytes/Nutrition: Monitor I/O. Check CMET in am 8. Group B strep bactermia: Antibiotics changed to  PCN G  --MRI TL spine limited by claustrophobia.  --2 d echo OK 9. T2DM: Hgb A1c 6.5 and well controlled on Insulin  glargine 40 units w/20 units lispro bid.  --Will monitor BS ac/hs and use SSI for elevated BS  --Increase basal insulin  to 30 units. Change SSI to sensitive. May need to resume meal coverage.  10.  HTN: Monitor BP TID--on amlodipine  11. Abnormal LFTs: Likley multifactorial. Recheck in am 12. ABLA: Recheck CBC in am.  13. Acute renal failure: Due to infection and has resolved.      Sharlet GORMAN Schmitz, PA-C 08/24/2024  I have personally performed a face to face diagnostic evaluation of this patient and formulated the key components of the plan.  Additionally, I have personally reviewed laboratory data, imaging studies, as well as relevant notes and concur with the physician assistant's documentation above.   The patient's status has not changed from the original H&P.  Any changes in documentation from the acute care chart have been noted above.

## 2024-08-24 NOTE — Progress Notes (Signed)
 PMR Admission Coordinator Pre-Admission Assessment   Patient: Sean Carter is an 53 y.o., male MRN: [id number] DOB: April 19, 1971 Height: 5' 7 (170.2 cm) Weight: 99.8 kg   Insurance Information HMO: Yes    PPO:      PCP:      IPA:      80/20:      OTHER:  PRIMARY: [employer]      Policy#: [id number]      Subscriber: patient CM Name:       Phone#: [phone]     Fax#: [phone] Pre-Cert#: [id number] approval for CIR from 08/24/24 to 08/28/24 with next review due on 08/29/24 at fax [phone]      Employer: [employer] Benefits:  Phone #: [phone]     Name: Verified on Availity 08/23/24 Eff. Date: 05/19/24     Deduct: $7500    Out of Pocket Max: $9200 (met $107.20)    Life Max: n/a CIR: 50% co-insurance      SNF: 50% con-insurance Outpatient:      Co-Pay: $50/visit Home Health:       Co-Pay: $50-100% per visit DME: 50%      Co-Pay: 50% Providers: in network  SECONDARY:       Policy#:      Phone#:    Artist:       Phone#:    The Data Processing Manager" for patients in Inpatient Rehabilitation Facilities with attached "Privacy Act Statement-Health Care Records" was provided and verbally reviewed with: Patient   Emergency Contact Information Contact Information       Name Relation Home Work Mobile    Thompson,Frances Significant other [phone]             Other Contacts   None on File        Current Medical History  Patient Admitting Diagnosis: New L BKA, old R BKA   History of Present Illness:  A 53 y.o. male admitted on11/3/25 to [facility] from the ED department for sepsis and left foot infection with gangrenous changes and malodorous. X-ray of L foot demonstrated possible osteomyelitis of the medial cortex of the left navicular bone with overlying soft tissue gas. Pt s/p L BKA 08/22/24. PMHx: T2DM, PAD, s/p L great toe amputation, s/p R 1st and 2nd partial ray amputation, s/p R TMA, s/p R BKA, HTN, HLD, restrictive lung disease,  and GERD.  PT/OT evaluations completed with recommendations for acute inpatient rehab admission.   Patient's medical record from [facility] has been reviewed by the rehabilitation admission coordinator and physician.   Past Medical History      Past Medical History:  Diagnosis Date   Blurry vision 07/10/2020   Diabetes mellitus without complication ([name])     Gangrene of toe of left foot ([name])     Peripheral arterial disease     Sepsis due to Streptococcus, group B ([name]) 06/20/2020   Streptococcal bacteremia            Has the patient had major surgery during 100 days prior to admission? Yes   Family History   family history is not on file.   Current Medications  Current Medications    Current Facility-Administered Medications:    albuterol  (PROVENTIL ) (2.5 MG/3ML) 0.083% nebulizer solution 2.5 mg, 2.5 mg, Inhalation, Q6H PRN, Baglia, Corrina, PA-C   amLODipine  (NORVASC ) tablet 10 mg, 10 mg, Oral, Daily, Diona Perkins, MD   aspirin  EC tablet 81 mg, 81 mg, Oral, Daily, Larraine Palma, MD,  81 mg at 08/24/24 0937   atorvastatin  (LIPITOR) tablet 40 mg, 40 mg, Oral, Daily, Baglia, Corrina, PA-C, 40 mg at 08/24/24 9062   enoxaparin  (LOVENOX ) injection 40 mg, 40 mg, Subcutaneous, Q24H, Nemecek, Amanda, MD, 40 mg at 08/24/24 9062   famotidine  (PEPCID ) tablet 40 mg, 40 mg, Oral, BID, Baglia, Corrina, PA-C, 40 mg at 08/24/24 0937   HYDROcodone-acetaminophen  (NORCO) 7.5-325 MG per tablet 1-2 tablet, 1-2 tablet, Oral, Q4H PRN, Baglia, Corrina, PA-C, 2 tablet at 08/24/24 1034   HYDROcodone-acetaminophen  (NORCO/VICODIN) 5-325 MG per tablet 1-2 tablet, 1-2 tablet, Oral, Q4H PRN, Baglia, Corrina, PA-C, 2 tablet at 08/23/24 1640   insulin  aspart (novoLOG ) injection 0-15 Units, 0-15 Units, Subcutaneous, TID WC, Pray, Margaret E, MD, 3 Units at 08/24/24 1138   insulin  aspart (novoLOG ) injection 0-5 Units, 0-5 Units, Subcutaneous, QHS, Pray, Rollene BRAVO, MD   insulin  glargine-yfgn (SEMGLEE )  injection 25 Units, 25 Units, Subcutaneous, Daily, Diona Perkins, MD, 25 Units at 08/23/24 2116   ketorolac  (TORADOL ) 15 MG/ML injection 15 mg, 15 mg, Intravenous, Q6H PRN, Baglia, Corrina, PA-C   LORazepam  (ATIVAN ) tablet 0.5 mg, 0.5 mg, Oral, Once, Majeed, Sara, DO   montelukast  (SINGULAIR ) tablet 10 mg, 10 mg, Oral, QHS, Baglia, Corrina, PA-C, 10 mg at 08/23/24 2113   ondansetron  (ZOFRAN -ODT) disintegrating tablet 4 mg, 4 mg, Oral, Q8H PRN, Baglia, Corrina, PA-C   penicillin  G potassium 24 Million Units in dextrose  5 % 500 mL CONTINUOUS infusion, 24 Million Units, Intravenous, Q24H, Dixon, Stephanie N, NP, Last Rate: 20.8 mL/hr at 08/24/24 1035, 24 Million Units at 08/24/24 1035   potassium chloride  SA (KLOR-CON  M) CR tablet 40-60 mEq, 40-60 mEq, Oral, Daily PRN, Baglia, Corrina, PA-C     Patients Current Diet:  Diet Order                  Diet Carb Modified Fluid consistency: Thin; Room service appropriate? Yes  Diet effective now                         Precautions / Restrictions Precautions Precautions: Fall Other Brace: L limb protector (Pt declined to wear d/t discomfort and tenderness on L residual limb) Restrictions Weight Bearing Restrictions Per Provider Order: Yes LLE Weight Bearing Per Provider Order: Non weight bearing    Has the patient had 2 or more falls or a fall with injury in the past year? Yes   Prior Activity Level Community (5-7x/wk): Went out daily.  Worked as a investment banker, operational.   Prior Functional Level Self Care: Did the patient need help bathing, dressing, using the toilet or eating? Independent   Indoor Mobility: Did the patient need assistance with walking from room to room (with or without device)? Independent   Stairs: Did the patient need assistance with internal or external stairs (with or without device)? Independent   Functional Cognition: Did the patient need help planning regular tasks such as shopping or remembering to take medications? Independent    Patient Information Are you of Hispanic, Latino/a,or Spanish origin?: A. No, not of Hispanic, Latino/a, or Spanish origin What is your race?: A. White Do you need or want an interpreter to communicate with a doctor or health care staff?: 0. No Patient information obtained via proxy : No   Patient's Response To:  Health Literacy and Transportation Is the patient able to respond to health literacy and transportation needs?: Yes Health Literacy - How often do you need to have someone help you when you  read instructions, pamphlets, or other written material from your doctor or pharmacy?: Never In the past 12 months, has lack of transportation kept you from medical appointments or from getting medications?: No In the past 12 months, has lack of transportation kept you from meetings, work, or from getting things needed for daily living?: No Higher Education Careers Adviser obtained via proxy: No   Journalist, Newspaper / Equipment Home Equipment: Wheelchair - manual, Rollator (4 wheels), Grab bars - tub/shower, Shower seat - built in   Prior Device Use: Indicate devices/aids used by the patient prior to current illness, exacerbation or injury? Walker, Orthotics/Prosthetics, and 3-wheel rolly cart   Current Functional Level Cognition   Orientation Level: Oriented X4    Extremity Assessment (includes Sensation/Coordination)   Upper Extremity Assessment: Defer to OT evaluation  Lower Extremity Assessment: LLE deficits/detail LLE Deficits / Details: Pt POD 1 s/p BKA. Decreased hip and knee AROM. Grossly 3/5 strength. LLE: Unable to fully assess due to pain LLE Sensation: decreased proprioception LLE Coordination: decreased gross motor     ADLs   Overall ADL's : Needs assistance/impaired Eating/Feeding: Independent, Sitting Grooming: Set up, Sitting Upper Body Bathing: Set up, Sitting Lower Body Bathing: Minimal assistance, Sitting/lateral leans Upper Body Dressing : Set up,  Sitting Lower Body Dressing: Minimal assistance, Sitting/lateral leans Toilet Transfer: Contact guard assist, Transfer board     Mobility   Overal bed mobility: Needs Assistance Bed Mobility: Supine to Sit Supine to sit: Contact guard, Used rails, HOB elevated General bed mobility comments: Pt declined to sit EOB, engaging in scooting activity, or participate in repositioning higher up in bed and/or with sheets and pad fixed. Provided max encouragement with wife present and supportive of pt to move OOB. He continued to decline.     Transfers   Overall transfer level: Needs assistance Equipment used: None Transfers: Bed to chair/wheelchair/BSC Bed to/from chair/wheelchair/BSC transfer type:: Lateral/scoot transfer  Lateral/Scoot Transfers: Contact guard assist General transfer comment: Pt declined to transfer to recliner chair despite encouragement and education on the benefits.     Ambulation / Gait / Stairs / Wheelchair Mobility   Ambulation/Gait General Gait Details: Deferred d/t open wound on distal anterior R residual limb. Unable to apply prosthesis.     Posture / Balance Dynamic Sitting Balance Sitting balance - Comments: Pt sat EOB with close supervision and engaged in scooting with CGA. Balance Overall balance assessment: Needs assistance (Session remained bed level) Sitting-balance support: Bilateral upper extremity supported, Feet unsupported Sitting balance-Leahy Scale: Fair Sitting balance - Comments: Pt sat EOB with close supervision and engaged in scooting with CGA.     Special considerations/life events  Continuous Drip IV  IVPB antibiotics, Skin new post op incision left BKA with dressing and dressing to old right BKA stump area, Diabetic management yes h/o DM and Hgb A1c 6.5, and Special service needs None    Previous Home Environment (from acute therapy documentation) Living Arrangements: Spouse/significant other Available Help at Discharge: Family, Available 24  hours/day Type of Home: House (townhome) Home Layout: Two level, 1/2 bath on main level, Bed/bath upstairs Alternate Level Stairs-Rails: Right Alternate Level Stairs-Number of Steps: 16 Home Access: Level entry Bathroom Shower/Tub: Psychologist, counselling, Engineer, Manufacturing Systems: Standard Bathroom Accessibility: No Home Care Services: No   Discharge Living Setting Plans for Discharge Living Setting: Patient's home, House, Lives with (comment) (Lives with SO in a townhouse.) Type of Home at Discharge: House Discharge Home Layout: Two level, 1/2 bath on main level,  Bed/bath upstairs Alternate Level Stairs-Rails: Right Alternate Level Stairs-Number of Steps: 16-17 Discharge Home Access: Level entry Discharge Bathroom Shower/Tub: Tub/shower unit, Walk-in shower, Curtain Discharge Bathroom Toilet: Standard Discharge Bathroom Accessibility: Yes How Accessible: Accessible via walker Does the patient have any problems obtaining your medications?: No   Social/Family/Support Systems Patient Roles: Partner Contact Information: [name] - SO - [phone] Anticipated Caregiver: MYNA Cathlean Ability/Limitations of Caregiver: Caregiver SO works [employer] Caregiver Availability: Intermittent Discharge Plan Discussed with Primary Caregiver: Yes Is Caregiver In Agreement with Plan?: Yes Does Caregiver/Family have Issues with Lodging/Transportation while Pt is in Rehab?: No   Goals Patient/Family Goal for Rehab: PT/OT mod I and supervision goals Expected length of stay: 7-10 days Pt/Family Agrees to Admission and willing to participate: Yes Program Orientation Provided & Reviewed with Pt/Caregiver Including Roles  & Responsibilities: Yes   Decrease burden of Care through IP rehab admission: N/A   Possible need for SNF placement upon discharge: Not planned   Patient Condition: I have reviewed medical records from [facility], spoken with CM, and patient and spouse. I met with patient  at the bedside for inpatient rehabilitation assessment.  Patient will benefit from ongoing PT and OT, can actively participate in 3 hours of therapy a day 5 days of the week, and can make measurable gains during the admission.  Patient will also benefit from the coordinated team approach during an Inpatient Acute Rehabilitation admission.  The patient will receive intensive therapy as well as Rehabilitation physician, nursing, social worker, and care management interventions.  Due to bladder management, bowel management, safety, skin/wound care, disease management, medication administration, pain management, and patient education the patient requires 24 hour a day rehabilitation nursing.  The patient is currently CGA to min assist with mobility and basic ADLs.  Discharge setting and therapy post discharge at home with home health is anticipated.  Patient has agreed to participate in the Acute Inpatient Rehabilitation Program and will admit today.   Preadmission Screen Completed By:  Lovett CHRISTELLA Ropes, 08/24/2024 12:51 PM ______________________________________________________________________   Discussed status with Dr. Cornelio on 08/24/24 at 1251 and received approval for admission today.   Admission Coordinator:  Lovett CHRISTELLA Ropes, RN, time 1251/Date 08/24/24    Assessment/Plan: Diagnosis:  New L BKA and old R BKA Does the need for close, 24 hr/day Medical supervision in concert with the patient's rehab needs make it unreasonable for this patient to be served in a less intensive setting? Yes Co-Morbidities requiring supervision/potential complications: DM, HTN, bacteremia- on IV ABX, transaminitis; need to switch pain meds due to LFT elevation; HLD, restrictive lung disease Due to bladder management, bowel management, safety, skin/wound care, disease management, medication administration, pain management, and patient education, does the patient require 24 hr/day rehab nursing? Yes Does the patient require  coordinated care of a physician, rehab nurse, PT, OT, to address physical and functional deficits in the context of the above medical diagnosis(es)? Yes Addressing deficits in the following areas: balance, endurance, locomotion, strength, transferring, bathing, dressing, feeding, grooming, and toileting Can the patient actively participate in an intensive therapy program of at least 3 hrs of therapy 5 days a week? Yes The potential for patient to make measurable gains while on inpatient rehab is good Anticipated functional outcomes upon discharge from inpatient rehab: modified independent and supervision PT, modified independent and supervision OT, n/a SLP Estimated rehab length of stay to reach the above functional goals is: 7-10 days at w/c level Anticipated discharge destination: Home 10. Overall  Rehab/Functional Prognosis: good     MD Signature:            Revision History

## 2024-08-24 NOTE — H&P (Signed)
 Physical Medicine and Rehabilitation Admission H&P        Chief Complaint  Patient presents with   Functional deficits due to L-BKA. History of R-BKA    HPI:  Sean Carter is a 53 year old male with history of T2DM (A1c 6.5) with diabetic retinopathy, PAD s/p multiple R-BKA 08/2023 after multiple procedures and developed left foot pain for 3 weeks progressing to fever and chills 2-3 days PTA on 08/21/24. He was found to have gangrenous changes of most of his foot with malodor which was felt to be non-salvageable. He was started on IV Vanc/Zosyn  and underwent L-BKA on 11/04 by Dr. Sheree. Blood cultures done showed Group B strep and antibiotics changed to PCN with recommendations to rule out diskitis/OM of spine and TTE. He was unable to tolerate MRI (and refuses to get it done under anesthesia) and 2D echo done revealing EF 55-60% and mild thickening of AV/MV.   Leucocytosis resolving with WBC down form 19.7-->10.9 and ABLA noted. PT/OT consulted and patient requires CGA for ADLs and mobility.  He was working PTA and CIR recommended due to functional decline.    Pt reports will NEVER get an MRI again even under anaesthesia   Pt figured out with nursing today that IV was leaking and might not have received all his ABX yesterday.  Doesn't want Oxy - doesn't like how it makes him feel- willing to do Tramadol , since LFT's increasing- Cannot stay on Norco due to tylenol .    LBM this AM; voiding well.  Pain is 8/10- last got pain meds 2 hours ago- Is  a little rough.  Chronic hip pain as well.    Review of Systems  HENT:  Negative for hearing loss and tinnitus.   Eyes:  Positive for blurred vision (diabetic retinopathy--gets shots monthly). Negative for double vision.  Respiratory:  Positive for cough (since 2021).   Cardiovascular:  Negative for chest pain and palpitations.  Gastrointestinal:  Negative for constipation, heartburn and nausea.  Genitourinary:  Negative for dysuria,  frequency and urgency.  Musculoskeletal:  Positive for back pain (sharp pain left hip--since 2021). Negative for myalgias and neck pain.  Neurological:  Negative for dizziness, tingling, sensory change and headaches.  Psychiatric/Behavioral:  The patient is not nervous/anxious and does not have insomnia.   All other systems reviewed and are negative.          Past Medical History:  Diagnosis Date   Blurry vision 07/10/2020   Diabetes mellitus without complication ([name])     Gangrene of toe of left foot ([name])     Peripheral arterial disease     Sepsis due to Streptococcus, group B ([name]) 06/20/2020   Streptococcal bacteremia                 Past Surgical History:  Procedure Laterality Date   ABDOMINAL AORTOGRAM W/LOWER EXTREMITY N/A 01/18/2023    Procedure: ABDOMINAL AORTOGRAM W/LOWER EXTREMITY;  Surgeon: Eliza Lonni RAMAN, MD;  Location: [facility] INVASIVE CV LAB;  Service: Cardiovascular;  Laterality: N/A;   AMPUTATION Left 05/31/2020    Procedure: Ray Amputation of Left Great Toe with Negative Pressure Vac Placement;  Surgeon: Gretta Lonni PARAS, MD;  Location: [facility] OR;  Service: Vascular;  Laterality: Left;   AMPUTATION Right 07/22/2022    Procedure: RIGHT FIRST AND SECOND RAY PARTIAL  AMPUTATION;  Surgeon: Magda Debby SAILOR, MD;  Location: [facility] OR;  Service: Vascular;  Laterality: Right;   AMPUTATION Right 09/02/2023  Procedure: AMPUTATION BELOW KNEE;  Surgeon: Serene Gaile ORN, MD;  Location: [facility] OR;  Service: Vascular;  Laterality: Right;   AMPUTATION Left 08/22/2024    Procedure: LEFT AMPUTATION BELOW KNEE;  Surgeon: Sheree Penne Bruckner, MD;  Location: [facility] OR;  Service: Vascular;  Laterality: Left;   APPLICATION OF WOUND VAC Right 01/22/2023    Procedure: APPLICATION OF WOUND VAC, RIGHT FOOT;  Surgeon: Sheree Penne Bruckner, MD;  Location: [facility] OR;  Service: Vascular;  Laterality: Right;   INCISION AND DRAINAGE OF WOUND Right 01/22/2023    Procedure: WASHOUT OF RIGHT FOOT WOUND;   Surgeon: Sheree Penne Bruckner, MD;  Location: [facility] OR;  Service: Vascular;  Laterality: Right;   PERIPHERAL VASCULAR BALLOON ANGIOPLASTY   01/18/2023    Procedure: PERIPHERAL VASCULAR BALLOON ANGIOPLASTY;  Surgeon: Eliza Bruckner RAMAN, MD;  Location: [facility] INVASIVE CV LAB;  Service: Cardiovascular;;   STUMP REVISION Right 01/22/2023    Procedure: PARTIAL CLOSURE OF RIGHT FOOT WOUND;  Surgeon: Sheree Penne Bruckner, MD;  Location: [facility] OR;  Service: Vascular;  Laterality: Right;   TRANSMETATARSAL AMPUTATION Right 01/19/2023    Procedure: TRANSMETATARSAL AMPUTATION;  Surgeon: Sheree Penne Bruckner, MD;  Location: [facility] OR;  Service: Vascular;  Laterality: Right;               Family History  Problem Relation Age of Onset   Diabetes Father            Social History:  Married. Independent and was working as a Actuary. He reports that he has never smoked. He has never been exposed to tobacco smoke. He has never used smokeless tobacco. He reports that he does not use drugs. No history on file for alcohol  use.     Allergies      Allergies  Allergen Reactions   Scallops [Shellfish Allergy] Anaphylaxis              Medications Prior to Admission  Medication Sig Dispense Refill   acetaminophen  (TYLENOL ) 500 MG tablet Take 1,000 mg by mouth 2 (two) times daily as needed for headache or fever (pain).       amLODipine  (NORVASC ) 10 MG tablet TAKE 1 TABLET BY MOUTH AT BEDTIME (Patient taking differently: Take 10 mg by mouth daily.) 60 tablet 2   atorvastatin  (LIPITOR) 40 MG tablet Take 1 tablet by mouth once daily 90 tablet 2   cetirizine  (ZYRTEC ) 10 MG tablet Take 1 tablet by mouth once daily (Patient taking differently: Take 10 mg by mouth at bedtime.) 30 tablet 10   Continuous Glucose Sensor (DEXCOM G7 SENSOR) MISC 3 Devices by Does not apply route as directed. Apply new sensor every 10 days 3 each 11   famotidine  (PEPCID ) 40 MG tablet Take 1 tablet (40 mg total) by mouth 2 (two) times  daily. 60 tablet 2   hydrochlorothiazide  (HYDRODIURIL ) 25 MG tablet Take 1 tablet (25 mg total) by mouth daily. 90 tablet 3   ibuprofen  (ADVIL ) 200 MG tablet Take 400 mg by mouth 2 (two) times daily as needed for headache (pain).       Insulin  Glargine (BASAGLAR  KWIKPEN) 100 UNIT/ML Inject 40 Units into the skin daily. 36 mL 3   insulin  lispro (HUMALOG  KWIKPEN) 100 UNIT/ML KwikPen Inject 20 Units into the skin with breakfast, with lunch, and with evening meal. (Patient taking differently: Inject 20 Units into the skin 2 (two) times daily before a meal.) 54 mL 3   montelukast  (SINGULAIR ) 10 MG tablet Take 1 tablet (10 mg  total) by mouth at bedtime. 90 tablet 1   rOPINIRole  (REQUIP ) 1 MG tablet TAKE 1 TABLET BY MOUTH AT BEDTIME (Patient taking differently: Take 1 mg by mouth at bedtime as needed (restless legs).) 30 tablet 2          Home: Home Living Family/patient expects to be discharged to:: Private residence Living Arrangements: Spouse/significant other Available Help at Discharge: Family, Available 24 hours/day Type of Home: House (townhome) Home Access: Level entry Home Layout: Two level, 1/2 bath on main level, Bed/bath upstairs Alternate Level Stairs-Number of Steps: 16 Alternate Level Stairs-Rails: Right Bathroom Shower/Tub: Psychologist, counselling, Engineer, Manufacturing Systems: Standard Bathroom Accessibility: No Home Equipment: Wheelchair - manual, Rollator (4 wheels), Grab bars - tub/shower, Shower seat - built in   Functional History: Prior Function Prior Level of Function : Independent/Modified Independent, Driving, Working/employed Mobility Comments: Ambulates without AD but using R BKA prosthesis. 1 fall at work. ADLs Comments: Indep with ADLs/IADLs. He was working as a investment banker, operational, typically ~90hrs/wk. Now transitioned to [place] and will only work a max of ~50hrs/wk. Pt reports he was just begining to learn how to drive an adapted vehicle.   Functional Status:  Mobility: Bed  Mobility Overal bed mobility: Needs Assistance Bed Mobility: Supine to Sit Supine to sit: Contact guard, Used rails, HOB elevated General bed mobility comments: Pt declined to sit EOB, engaging in scooting activity, or participate in repositioning higher up in bed and/or with sheets and pad fixed. Provided max encouragement with wife present and supportive of pt to move OOB. He continued to decline. Transfers Overall transfer level: Needs assistance Equipment used: None Transfers: Bed to chair/wheelchair/BSC Bed to/from chair/wheelchair/BSC transfer type:: Lateral/scoot transfer  Lateral/Scoot Transfers: Contact guard assist General transfer comment: Pt declined to transfer to recliner chair despite encouragement and education on the benefits. Ambulation/Gait General Gait Details: Deferred d/t open wound on distal anterior R residual limb. Unable to apply prosthesis.   ADL: ADL Overall ADL's : Needs assistance/impaired Eating/Feeding: Independent, Sitting Grooming: Set up, Sitting Upper Body Bathing: Set up, Sitting Lower Body Bathing: Minimal assistance, Sitting/lateral leans Upper Body Dressing : Set up, Sitting Lower Body Dressing: Minimal assistance, Sitting/lateral leans Toilet Transfer: Contact guard assist, Transfer board   Cognition: Cognition Orientation Level: Oriented X4 Cognition Arousal: Alert Behavior During Therapy: WFL for tasks assessed/performed, Anxious   Physical Exam: Blood pressure 130/66, pulse 69, temperature 99.3 F (37.4 C), temperature source Oral, resp. rate 17, height 5' 7 (1.702 m), weight 99.8 kg, SpO2 95%. Physical Exam Vitals and nursing note reviewed. Exam conducted with a chaperone present.  Constitutional:      General: He is not in acute distress.    Appearance: Normal appearance. He is obese.     Comments: Pt sitting up in bed; FM resident and nurse in room; as well as PA and pt and myself Pt awake, alert, appropriate, but kept closing  eyes- closed ~ 75-80% of interview/exam, NAD  HENT:     Head: Normocephalic and atraumatic.     Right Ear: External ear normal.     Left Ear: External ear normal.     Nose: Nose normal. No congestion.     Mouth/Throat:     Mouth: Mucous membranes are moist.     Pharynx: No oropharyngeal exudate.  Eyes:     General:        Right eye: No discharge.        Left eye: No discharge.     Extraocular Movements: Extraocular  movements intact.  Cardiovascular:     Rate and Rhythm: Normal rate and regular rhythm.     Heart sounds: Normal heart sounds. No murmur heard.    No gallop.  Pulmonary:     Effort: Pulmonary effort is normal. No respiratory distress.     Breath sounds: Normal breath sounds. No wheezing, rhonchi or rales.  Abdominal:     Palpations: Abdomen is soft.     Comments: Very protuberant; non distended; normoactive; Soft  Musculoskeletal:     Cervical back: Neck supple. No tenderness.     Comments: Ue's 5/5 throughout B/L RLE_ HF/KE/KF 5/5- has R BKA- with scabs on R BKA from standing 9-12 hours/day at work LLE- HF/KE/KF at least 4/5- bulbous L BKA  Skin:    General: Skin is warm.     Comments: A couple of open and abraded areas on R-BKA. L-BKA with dry dressing.  Per last pic- has oozing from incision- dog ears (+) and bulbous L Forearm IV- not leaking anymore- constantly on IV Pen G  Neurological:     Mental Status: He is alert and oriented to person, place, and time.     Comments: Intact to light touch in all 4 extremities Ox3- keeps eyes closed due to fuzziness  Psychiatric:        Mood and Affect: Mood normal.        Behavior: Behavior normal.       Lab Results Last 48 Hours        Results for orders placed or performed during the hospital encounter of 08/21/24 (from the past 48 hours)  Basic metabolic panel     Status: Abnormal    Collection Time: 08/22/24  3:27 PM  Result Value Ref Range    Sodium 136 135 - 145 mmol/L    Potassium 3.3 (L) 3.5 - 5.1 mmol/L     Chloride 96 (L) 98 - 111 mmol/L    CO2 27 22 - 32 mmol/L    Glucose, Bld 163 (H) 70 - 99 mg/dL      Comment: Glucose reference range applies only to samples taken after fasting for at least 8 hours.    BUN 26 (H) 6 - 20 mg/dL    Creatinine, Ser 8.75 0.61 - 1.24 mg/dL    Calcium  7.9 (L) 8.9 - 10.3 mg/dL    GFR, Estimated >39 >39 mL/min      Comment: (NOTE) Calculated using the CKD-EPI Creatinine Equation (2021)      Anion gap 13 5 - 15      Comment: Performed at [facility], [address], [place], [place] [address]  Magnesium     Status: None    Collection Time: 08/22/24  3:27 PM  Result Value Ref Range    Magnesium 2.3 1.7 - 2.4 mg/dL      Comment: Performed at [facility], [address] [place], [place] [address]  Glucose, capillary     Status: Abnormal    Collection Time: 08/22/24  5:38 PM  Result Value Ref Range    Glucose-Capillary 141 (H) 70 - 99 mg/dL      Comment: Glucose reference range applies only to samples taken after fasting for at least 8 hours.  Glucose, capillary     Status: Abnormal    Collection Time: 08/22/24 10:06 PM  Result Value Ref Range    Glucose-Capillary 242 (H) 70 - 99 mg/dL      Comment: Glucose reference range applies only  to samples taken after fasting for at least 8 hours.  CBC     Status: Abnormal    Collection Time: 08/23/24  3:11 AM  Result Value Ref Range    WBC 11.5 (H) 4.0 - 10.5 K/uL    RBC 3.54 (L) 4.22 - 5.81 MIL/uL    Hemoglobin 9.5 (L) 13.0 - 17.0 g/dL    HCT 70.6 (L) 60.9 - 52.0 %    MCV 82.8 80.0 - 100.0 fL    MCH 26.8 26.0 - 34.0 pg    MCHC 32.4 30.0 - 36.0 g/dL    RDW 86.1 88.4 - 84.4 %    Platelets 284 150 - 400 K/uL    nRBC 0.0 0.0 - 0.2 %      Comment: Performed at [facility], [address] [place], [place] [address]  Basic metabolic panel     Status: Abnormal    Collection Time: 08/23/24  3:11 AM  Result Value Ref Range    Sodium 133 (L) 135 - 145 mmol/L    Potassium 3.5 3.5 - 5.1 mmol/L     Chloride 96 (L) 98 - 111 mmol/L    CO2 27 22 - 32 mmol/L    Glucose, Bld 255 (H) 70 - 99 mg/dL      Comment: Glucose reference range applies only to samples taken after fasting for at least 8 hours.    BUN 25 (H) 6 - 20 mg/dL    Creatinine, Ser 8.83 0.61 - 1.24 mg/dL    Calcium  7.9 (L) 8.9 - 10.3 mg/dL    GFR, Estimated >39 >39 mL/min      Comment: (NOTE) Calculated using the CKD-EPI Creatinine Equation (2021)      Anion gap 10 5 - 15      Comment: Performed at [facility], [address] [place], [place] [address]  Magnesium     Status: None    Collection Time: 08/23/24  3:11 AM  Result Value Ref Range    Magnesium 2.2 1.7 - 2.4 mg/dL      Comment: Performed at [facility], [address] [place], [place] [address]  Glucose, capillary     Status: Abnormal    Collection Time: 08/23/24  5:45 AM  Result Value Ref Range    Glucose-Capillary 219 (H) 70 - 99 mg/dL      Comment: Glucose reference range applies only to samples taken after fasting for at least 8 hours.  Glucose, capillary     Status: Abnormal    Collection Time: 08/23/24  9:30 AM  Result Value Ref Range    Glucose-Capillary 219 (H) 70 - 99 mg/dL      Comment: Glucose reference range applies only to samples taken after fasting for at least 8 hours.  Sedimentation rate     Status: Abnormal    Collection Time: 08/23/24 11:00 AM  Result Value Ref Range    Sed Rate 122 (H) 0 - 16 mm/hr      Comment: Performed at [facility], [address], [place], [place] [address]  C-reactive protein     Status: Abnormal    Collection Time: 08/23/24 11:00 AM  Result Value Ref Range    CRP 31.3 (H) <1.0 mg/dL      Comment: Performed at [facility], [address], [place], [place] [address]  Culture, blood (Routine X 2) w Reflex to ID Panel     Status: None (Preliminary result)  Collection Time: 08/23/24 11:00 AM    Specimen: BLOOD RIGHT HAND  Result Value Ref Range    Specimen Description BLOOD  RIGHT HAND      Special Requests          AEROBIC BOTTLE ONLY Blood Culture results may not be optimal due to an inadequate volume of blood received in culture bottles    Culture          NO GROWTH < 24 HOURS Performed at [facility], [address] [place], [place] [address]      Report Status PENDING    Culture, blood (Routine X 2) w Reflex to ID Panel     Status: None (Preliminary result)    Collection Time: 08/23/24 11:00 AM    Specimen: BLOOD LEFT ARM  Result Value Ref Range    Specimen Description BLOOD LEFT ARM      Special Requests          BOTTLES DRAWN AEROBIC AND ANAEROBIC Blood Culture adequate volume    Culture          NO GROWTH < 24 HOURS Performed at [facility], [address] [place], [place] [address]      Report Status PENDING    Glucose, capillary     Status: Abnormal    Collection Time: 08/23/24 11:38 AM  Result Value Ref Range    Glucose-Capillary 199 (H) 70 - 99 mg/dL      Comment: Glucose reference range applies only to samples taken after fasting for at least 8 hours.  Glucose, capillary     Status: Abnormal    Collection Time: 08/23/24  4:27 PM  Result Value Ref Range    Glucose-Capillary 130 (H) 70 - 99 mg/dL      Comment: Glucose reference range applies only to samples taken after fasting for at least 8 hours.  Glucose, capillary     Status: Abnormal    Collection Time: 08/23/24  8:44 PM  Result Value Ref Range    Glucose-Capillary 111 (H) 70 - 99 mg/dL      Comment: Glucose reference range applies only to samples taken after fasting for at least 8 hours.  CBC     Status: Abnormal    Collection Time: 08/24/24  3:13 AM  Result Value Ref Range    WBC 10.9 (H) 4.0 - 10.5 K/uL    RBC 3.68 (L) 4.22 - 5.81 MIL/uL    Hemoglobin 9.9 (L) 13.0 - 17.0 g/dL    HCT 69.4 (L) 60.9 - 52.0 %    MCV 82.9 80.0 - 100.0 fL    MCH 26.9 26.0 - 34.0 pg    MCHC 32.5 30.0 - 36.0 g/dL    RDW 86.3 88.4 - 84.4 %    Platelets 337 150 - 400 K/uL    nRBC  0.0 0.0 - 0.2 %      Comment: Performed at [facility], [address], [place], [place] [address]  Comprehensive metabolic panel     Status: Abnormal    Collection Time: 08/24/24  3:13 AM  Result Value Ref Range    Sodium 137 135 - 145 mmol/L    Potassium 3.5 3.5 - 5.1 mmol/L    Chloride 96 (L) 98 - 111 mmol/L    CO2 28 22 - 32 mmol/L    Glucose, Bld 112 (H) 70 - 99 mg/dL      Comment: Glucose reference range applies only to samples taken  after fasting for at least 8 hours.    BUN 19 6 - 20 mg/dL    Creatinine, Ser 8.99 0.61 - 1.24 mg/dL    Calcium  7.9 (L) 8.9 - 10.3 mg/dL    Total Protein 6.3 (L) 6.5 - 8.1 g/dL    Albumin 1.9 (L) 3.5 - 5.0 g/dL    AST 882 (H) 15 - 41 U/L    ALT 64 (H) 0 - 44 U/L    Alkaline Phosphatase 140 (H) 38 - 126 U/L    Total Bilirubin 0.6 0.0 - 1.2 mg/dL    GFR, Estimated >39 >39 mL/min      Comment: (NOTE) Calculated using the CKD-EPI Creatinine Equation (2021)      Anion gap 13 5 - 15      Comment: Performed at [facility], [address] [place], [place] [address]  Glucose, capillary     Status: Abnormal    Collection Time: 08/24/24  9:31 AM  Result Value Ref Range    Glucose-Capillary 172 (H) 70 - 99 mg/dL      Comment: Glucose reference range applies only to samples taken after fasting for at least 8 hours.  Glucose, capillary     Status: Abnormal    Collection Time: 08/24/24 11:11 AM  Result Value Ref Range    Glucose-Capillary 174 (H) 70 - 99 mg/dL      Comment: Glucose reference range applies only to samples taken after fasting for at least 8 hours.       Imaging Results (Last 48 hours)  MR THORACIC SPINE WO CONTRAST Result Date: 08/23/2024 EXAM: MR Thoracic Spine without 08/23/2024 07:52:26 PM TECHNIQUE: Multiplanar multisequence MRI of the thoracic spine was performed without the administration of intravenous contrast. COMPARISON: None available CLINICAL HISTORY: Back pain and bacteremia, concern for discitis. FINDINGS:  Incomplete study due to termination secondary to patient claustrophobia. Only sagittal T2 and STIR performed. BONES AND ALIGNMENT: Normal alignment. Normal vertebral body heights. At T12-L1, endplate signal changes that are largely STIR hypointense without definite/obvious disc edema. This level is incompletely imaged. No marrow or disc edema at other levels. SPINAL CORD: Normal spinal cord volume. Normal spinal cord signal. SOFT TISSUES: Unremarkable on limited assessment. DEGENERATIVE CHANGES: Limited evaluation without axial. Multiple small disc protrusions without high grade canal or foraminal stenosis. IMPRESSION: 1. Incomplete study with early termination secondary to patient claustrophobia. Only sagittal T2 and STIR performed. 2. At T12-L1, endplate signal changes which appear degenerative but are incompletely imaged. To fully exclude discitis, recommend completing MRI of the lumbar spine (preferably with contrast) if the patient is able. 3. No obvious evidence of discitis/osteomyelitis at other thoracic levels. 4. No high-grade canal or foraminal stenosis in the thoracic spine. Electronically signed by: Gilmore Molt MD 08/23/2024 08:32 PM EST RP Workstation: [id number]    ECHOCARDIOGRAM COMPLETE Result Date: 08/23/2024    ECHOCARDIOGRAM REPORT   Patient Name:   SABRE LEONETTI Date of Exam: 08/23/2024 Medical Rec #:  [id number]     Height:       67.0 in Accession #:    [id number]    Weight:       220.0 lb Date of Birth:  [birth year]    BSA:          2.106 m Patient Age:    52 years      BP:           143/73 mmHg Patient Gender: M  HR:           77 bpm. Exam Location:  Inpatient Procedure: 2D Echo (Both Spectral and Color Flow Doppler were utilized during            procedure). Indications:    Bacteremia  History:        Patient has prior history of Echocardiogram examinations. Risk                 Factors:Hypertension.  Sonographer:    Charmaine Gaskins Referring Phys: COREAN LOISE FIREMAN IMPRESSIONS   1. Left ventricular ejection fraction, by estimation, is 60 to 65%. The left ventricle has normal function. The left ventricle has no regional wall motion abnormalities. Left ventricular diastolic parameters are indeterminate.  2. Right ventricular systolic function is normal. The right ventricular size is normal. There is normal pulmonary artery systolic pressure.  3. The mitral valve is normal in structure. Trivial mitral valve regurgitation. No evidence of mitral stenosis.  4. Aortic valve abnormalities are unchanged compared with the echo 05/2020. The aortic valve is tricuspid. There is mild calcification of the aortic valve. There is mild thickening of the aortic valve. Aortic valve regurgitation is not visualized. No aortic stenosis is present.  5. The inferior vena cava is normal in size with greater than 50% respiratory variability, suggesting right atrial pressure of 3 mmHg. FINDINGS  Left Ventricle: Left ventricular ejection fraction, by estimation, is 60 to 65%. The left ventricle has normal function. The left ventricle has no regional wall motion abnormalities. The left ventricular internal cavity size was normal in size. There is  no left ventricular hypertrophy. Left ventricular diastolic parameters are indeterminate. Indeterminate filling pressures. Right Ventricle: The right ventricular size is normal. No increase in right ventricular wall thickness. Right ventricular systolic function is normal. There is normal pulmonary artery systolic pressure. The tricuspid regurgitant velocity is 2.25 m/s, and  with an assumed right atrial pressure of 3 mmHg, the estimated right ventricular systolic pressure is 23.2 mmHg. Left Atrium: Left atrial size was normal in size. Right Atrium: Right atrial size was normal in size. Pericardium: There is no evidence of pericardial effusion. Mitral Valve: The mitral valve is normal in structure. There is mild thickening of the mitral valve leaflet(s). Trivial mitral valve  regurgitation. No evidence of mitral valve stenosis. Tricuspid Valve: The tricuspid valve is normal in structure. Tricuspid valve regurgitation is mild . No evidence of tricuspid stenosis. Aortic Valve: Aortic valve abnormalities are unchanged compared with the echo 05/2020. The aortic valve is tricuspid. There is mild calcification of the aortic valve. There is mild thickening of the aortic valve. Aortic valve regurgitation is not visualized. No aortic stenosis is present. Pulmonic Valve: The pulmonic valve was normal in structure. Pulmonic valve regurgitation is not visualized. No evidence of pulmonic stenosis. Aorta: The aortic root is normal in size and structure. Venous: The inferior vena cava is normal in size with greater than 50% respiratory variability, suggesting right atrial pressure of 3 mmHg. IAS/Shunts: No atrial level shunt detected by color flow Doppler.  LEFT VENTRICLE PLAX 2D LVIDd:         5.00 cm   Diastology LVIDs:         2.80 cm   LV e' medial:    7.29 cm/s LV PW:         0.90 cm   LV E/e' medial:  14.5 LV IVS:        0.90 cm   LV e' lateral:  11.30 cm/s LVOT diam:     2.00 cm   LV E/e' lateral: 9.4 LVOT Area:     3.14 cm  RIGHT VENTRICLE RV Basal diam:  2.40 cm RV Mid diam:    3.00 cm RV S prime:     12.10 cm/s LEFT ATRIUM             Index        RIGHT ATRIUM           Index LA diam:        3.80 cm 1.80 cm/m   RA Area:     10.30 cm LA Vol (A2C):   61.5 ml 29.20 ml/m  RA Volume:   17.80 ml  8.45 ml/m LA Vol (A4C):   40.7 ml 19.33 ml/m LA Biplane Vol: 53.7 ml 25.50 ml/m   AORTA Ao Root diam: 3.00 cm Ao Asc diam:  3.30 cm MITRAL VALVE                TRICUSPID VALVE MV Area (PHT): 6.02 cm     TR Peak grad:   20.2 mmHg MV Decel Time: 126 msec     TR Vmax:        225.00 cm/s MV E velocity: 106.00 cm/s MV A velocity: 85.40 cm/s   SHUNTS MV E/A ratio:  1.24         Systemic Diam: 2.00 cm Annabella Scarce MD Electronically signed by Annabella Scarce MD Signature Date/Time: 08/23/2024/5:34:12  PM    Final            Blood pressure 130/66, pulse 69, temperature 99.3 F (37.4 C), temperature source Oral, resp. rate 17, height 5' 7 (1.702 m), weight 99.8 kg, SpO2 95%.   Medical Problem List and Plan: 1. Functional deficits secondary to L BKA in setting of old R BKA due to osteomyelitis and gangrene             -patient may not shower             -ELOS/Goals: 7-10 days supervision to mod I at w/c level Admit to CIR- appropriate 2.  Antithrombotics: -DVT/anticoagulation:  Pharmaceutical: Lovenox              -antiplatelet therapy: ASA 3. Pain Management: Hydrocodone--->changed to tramadol  prn.             --Ketorolac  X 5 days thru thru 11/08. Monitor renal status/BP stopped Norco due to LFT elevation 4. Mood/Behavior/Sleep: LCSW to follow for evaluation and support.              -antipsychotic agents: N/A 5. Neuropsych/cognition: This patient is capable of making decisions on his own behalf. 6. Skin/Wound Care: Routine pressure relief measures.  --Low carb protein supplements for wound healing.  7. Fluids/Electrolytes/Nutrition: Monitor I/O. Check CMET in am 8. Group B strep bactermia: Antibiotics changed to  PCN G             --MRI TL spine limited by claustrophobia.             --2 d echo OK 9. T2DM: Hgb A1c 6.5 and well controlled on Insulin  glargine 40 units w/20 units lispro bid.  --Will monitor BS ac/hs and use SSI for elevated BS             --Increase basal insulin  to 30 units. Change SSI to sensitive. May need to resume meal coverage.  10.  HTN: Monitor BP TID--on amlodipine  11. Abnormal LFTs: Likley multifactorial. Recheck in am  12. ABLA: Recheck CBC in am.  13. Acute renal failure: Due to infection and has resolved.         Sharlet GORMAN Schmitz, PA-C 08/24/2024   I have personally performed a face to face diagnostic evaluation of this patient and formulated the key components of the plan.  Additionally, I have personally reviewed laboratory data, imaging studies, as  well as relevant notes and concur with the physician assistant's documentation above.   The patient's status has not changed from the original H&P.  Any changes in documentation from the acute care chart have been noted above.             Revision History

## 2024-08-24 NOTE — Assessment & Plan Note (Signed)
 K of 3.5 this am, stable from yesterday. - Continue to follow - Consider stopping hydrochlorothiazide  on discharge given low K during hospitalization

## 2024-08-24 NOTE — Progress Notes (Signed)
 [facility]  Date of Admission:  08/21/2024      Total days of antibiotics 3   Penicillin  11/05 >         ASSESSMENT: Sean Carter is a 53 y.o. male admitted with   Group B Streptococcus Bacteremia -  Necrotic Left DFU , s/p BKA 11/04 - Secondary to infected gangrenous foot now since definitive amputation with vascular team on 11/4. Not able to do MRI d/t chlaustrophobia. We offered CT with contrast to see if that provides useful information vs MRI with anesthesia. He will consider options today and let us  know. He does not have any groin pain or radicular symptoms. Leukocytosis resolving. TMax 99.9. Incision looks very good  TTE reviewed with him and his wife - stable aortic valve calcification with no new findings.  - He will consider contrasted CT of T and L spine (will miss evaluating epidural space and not as sensitive for infection) vs MRI with anesthesia.  - Continue Penicillin  infusion  - Follow repeat blood cultures to ensure clearance  - PICC to be placed pending cultures - maybe Saturday.   Elevated LFTs -  This seems to be chronic (had one normal value 46m prior but that seems like an outlier) - likely r/t fatty liver. ?alcohol  use. I don't think this is related to tylenol .  U/S from 2024 showed echogenic liver c/w hepatic steatosis. May have a degree of fibrosis. Lab Results  Component Value Date   ALT 64 (H) 08/24/2024   AST 117 (H) 08/24/2024   ALKPHOS 140 (H) 08/24/2024   BILITOT 0.6 08/24/2024     H/O Remote Childhood PCN Allergy -  Debunked and no longer relevant as he has tolerated unasyn  this admission. We discussed with him and his wife today.  - tolerating w/o issues  - remove from allergy list   Discharge Planning -  CIR planned. Awaiting consultation and insurance investigation.    PLAN: - Continue Penicillin  infusion  - Follow repeat blood cultures to ensure clearance  - PICC to be placed pending cultures -  maybe Saturday.   Principal Problem:   Sepsis due to Streptococcus agalactiae ([name]) Active Problems:   Hypokalemia   Sepsis with streptococcus agalactiae ([name])   Amputation of left great toe   Hypertension associated with diabetes ([name])   Elevated LFTs   Foot osteomyelitis, left s/p BKA ([name])   Chronic health problem   Anemia   Osteomyelitis of left foot ([name])    amLODipine   10 mg Oral Daily   aspirin  EC  81 mg Oral Daily   atorvastatin   40 mg Oral Daily   enoxaparin  (LOVENOX ) injection  40 mg Subcutaneous Q24H   famotidine   40 mg Oral BID   insulin  aspart  0-15 Units Subcutaneous TID WC   insulin  aspart  0-5 Units Subcutaneous QHS   insulin  glargine-yfgn  25 Units Subcutaneous Daily   LORazepam   0.5 mg Oral Once   montelukast   10 mg Oral QHS    SUBJECTIVE: Still an episode of sweating overnight. No new complaints. Just does not feel right.  Was not able to get MRI done d/t claustrophobia    Review of Systems: Review of Systems  Constitutional:  Positive for diaphoresis.  Respiratory: Negative.    Cardiovascular: Negative.   Genitourinary: Negative.   Musculoskeletal:  Positive for back pain and joint pain.    Allergies  Allergen Reactions   Scallops [Shellfish Allergy] Anaphylaxis  OBJECTIVE: Vitals:   08/23/24 1547 08/24/24 0442 08/24/24 0731 08/24/24 1259  BP: (!) 150/78 (!) 155/76 (!) 154/74 130/66  Pulse: 85 81 76 69  Resp: 16 18 17    Temp: 99.2 F (37.3 C) 99.9 F (37.7 C) 99.3 F (37.4 C)   TempSrc: Oral  Oral   SpO2: 93% 92% 95%   Weight:      Height:       Body mass index is 34.46 kg/m.  Physical Exam Constitutional:      Appearance: Normal appearance. He is not ill-appearing.  HENT:     Head: Normocephalic.     Mouth/Throat:     Mouth: Mucous membranes are moist.     Pharynx: Oropharynx is clear.  Eyes:     General: No scleral icterus. Cardiovascular:     Rate and Rhythm: Normal rate and regular rhythm.  Pulmonary:     Effort:  Pulmonary effort is normal.  Abdominal:     General: Bowel sounds are normal. There is no distension.     Palpations: Abdomen is soft.  Musculoskeletal:     Cervical back: Normal range of motion.  Skin:    Coloration: Skin is not jaundiced or pale.  Neurological:     Mental Status: He is alert and oriented to person, place, and time.  Psychiatric:        Mood and Affect: Mood normal.        Judgment: Judgment normal.     Lab Results Lab Results  Component Value Date   WBC 10.9 (H) 08/24/2024   HGB 9.9 (L) 08/24/2024   HCT 30.5 (L) 08/24/2024   MCV 82.9 08/24/2024   PLT 337 08/24/2024    Lab Results  Component Value Date   CREATININE 1.00 08/24/2024   BUN 19 08/24/2024   NA 137 08/24/2024   K 3.5 08/24/2024   CL 96 (L) 08/24/2024   CO2 28 08/24/2024    Lab Results  Component Value Date   ALT 64 (H) 08/24/2024   AST 117 (H) 08/24/2024   ALKPHOS 140 (H) 08/24/2024   BILITOT 0.6 08/24/2024     Microbiology: Recent Results (from the past 240 hours)  Culture, blood (Routine x 2)     Status: Abnormal   Collection Time: 08/21/24  1:08 PM   Specimen: BLOOD  Result Value Ref Range Status   Specimen Description BLOOD SITE NOT SPECIFIED  Final   Special Requests   Final    BOTTLES DRAWN AEROBIC AND ANAEROBIC Blood Culture results may not be optimal due to an inadequate volume of blood received in culture bottles   Culture  Setup Time   Final    GRAM POSITIVE COCCI IN CHAINS BOTTLES DRAWN AEROBIC ONLY CRITICAL VALUE NOTED.  VALUE IS CONSISTENT WITH PREVIOUSLY REPORTED AND CALLED VALUE.    Culture (A)  Final    GROUP B STREP(S.AGALACTIAE)ISOLATED SUSCEPTIBILITIES PERFORMED ON PREVIOUS CULTURE WITHIN THE LAST 5 DAYS. Performed at [facility], [address], [place], [place] [address]    Report Status 08/24/2024 FINAL  Final  Culture, blood (Routine x 2)     Status: Abnormal   Collection Time: 08/21/24  2:09 PM   Specimen: BLOOD  Result Value Ref Range Status    Specimen Description BLOOD SITE NOT SPECIFIED  Final   Special Requests   Final    BOTTLES DRAWN AEROBIC AND ANAEROBIC Blood Culture results may not be optimal due to an inadequate volume of blood received in culture bottles  Culture  Setup Time   Final    GRAM POSITIVE COCCI IN CHAINS ANAEROBIC BOTTLE ONLY CRITICAL RESULT CALLED TO, READ BACK BY AND VERIFIED WITH: ESABRA QUIET PHARMD, AT 1433 08/22/24 D. VANHOOK Performed at [facility], [address] [place], [place] [address]    Culture GROUP B STREP(S.AGALACTIAE)ISOLATED (A)  Final   Report Status 08/24/2024 FINAL  Final   Organism ID, Bacteria GROUP B STREP(S.AGALACTIAE)ISOLATED  Final      Susceptibility   Group b strep(s.agalactiae)isolated - MIC*    CLINDAMYCIN >=1 RESISTANT Resistant     AMPICILLIN  <=0.25 SENSITIVE Sensitive     ERYTHROMYCIN >=8 RESISTANT Resistant     VANCOMYCIN  0.5 SENSITIVE Sensitive     CEFTRIAXONE  <=0.12 SENSITIVE Sensitive     LEVOFLOXACIN 1 SENSITIVE Sensitive     PENICILLIN  <=0.06 SENSITIVE Sensitive     * GROUP B STREP(S.AGALACTIAE)ISOLATED  Blood Culture ID Panel (Reflexed)     Status: Abnormal   Collection Time: 08/21/24  2:09 PM  Result Value Ref Range Status   Enterococcus faecalis NOT DETECTED NOT DETECTED Final   Enterococcus Faecium NOT DETECTED NOT DETECTED Final   Listeria monocytogenes NOT DETECTED NOT DETECTED Final   Staphylococcus species NOT DETECTED NOT DETECTED Final   Staphylococcus aureus (BCID) NOT DETECTED NOT DETECTED Final   Staphylococcus epidermidis NOT DETECTED NOT DETECTED Final   Staphylococcus lugdunensis NOT DETECTED NOT DETECTED Final   Streptococcus species DETECTED (A) NOT DETECTED Final    Comment: CRITICAL RESULT CALLED TO, READ BACK BY AND VERIFIED WITH: E. SINCLAIR PHARMD, AT 1433 08/22/24 D. VANHOOK    Streptococcus agalactiae DETECTED (A) NOT DETECTED Final    Comment: CRITICAL RESULT CALLED TO, READ BACK BY AND VERIFIED WITH: ESABRA QUIET PHARMD,  AT 1433 08/22/24 D. VANHOOK    Streptococcus pneumoniae NOT DETECTED NOT DETECTED Final   Streptococcus pyogenes NOT DETECTED NOT DETECTED Final   A.calcoaceticus-baumannii NOT DETECTED NOT DETECTED Final   Bacteroides fragilis NOT DETECTED NOT DETECTED Final   Enterobacterales NOT DETECTED NOT DETECTED Final   Enterobacter cloacae complex NOT DETECTED NOT DETECTED Final   Escherichia coli NOT DETECTED NOT DETECTED Final   Klebsiella aerogenes NOT DETECTED NOT DETECTED Final   Klebsiella oxytoca NOT DETECTED NOT DETECTED Final   Klebsiella pneumoniae NOT DETECTED NOT DETECTED Final   Proteus species NOT DETECTED NOT DETECTED Final   Salmonella species NOT DETECTED NOT DETECTED Final   Serratia marcescens NOT DETECTED NOT DETECTED Final   Haemophilus influenzae NOT DETECTED NOT DETECTED Final   Neisseria meningitidis NOT DETECTED NOT DETECTED Final   Pseudomonas aeruginosa NOT DETECTED NOT DETECTED Final   Stenotrophomonas maltophilia NOT DETECTED NOT DETECTED Final   Candida albicans NOT DETECTED NOT DETECTED Final   Candida auris NOT DETECTED NOT DETECTED Final   Candida glabrata NOT DETECTED NOT DETECTED Final   Candida krusei NOT DETECTED NOT DETECTED Final   Candida parapsilosis NOT DETECTED NOT DETECTED Final   Candida tropicalis NOT DETECTED NOT DETECTED Final   Cryptococcus neoformans/gattii NOT DETECTED NOT DETECTED Final    Comment: Performed at [facility], [address], [place], [place] [address]  Culture, blood (Routine X 2) w Reflex to ID Panel     Status: None (Preliminary result)   Collection Time: 08/23/24 11:00 AM   Specimen: BLOOD RIGHT HAND  Result Value Ref Range Status   Specimen Description BLOOD RIGHT HAND  Final   Special Requests   Final    AEROBIC BOTTLE ONLY  Blood Culture results may not be optimal due to an inadequate volume of blood received in culture bottles   Culture   Final    NO GROWTH < 24 HOURS Performed at [facility], [address], [place], [place] [address]    Report Status PENDING  Incomplete  Culture, blood (Routine X 2) w Reflex to ID Panel     Status: None (Preliminary result)   Collection Time: 08/23/24 11:00 AM   Specimen: BLOOD LEFT ARM  Result Value Ref Range Status   Specimen Description BLOOD LEFT ARM  Final   Special Requests   Final    BOTTLES DRAWN AEROBIC AND ANAEROBIC Blood Culture adequate volume   Culture   Final    NO GROWTH < 24 HOURS Performed at [facility], [address], [place], [place] [address]    Report Status PENDING  Incomplete    Corean Fireman, MSN, NP-C [facility]  [email or web address] Pager: [phone] Office: [phone] [facility] Main Line: [phone] *Secure Chat Communication Welcome  Total Encounter Time: 18m

## 2024-08-24 NOTE — Assessment & Plan Note (Addendum)
 AST of 117, ALT of 64 today.  Increased from initial labs drawn on admission.  Normal levels 3 months ago.  Negative hepatitis panel 08/2023. Suspect elevation in setting of Tylenol  use for pain control. - Adjusting pain medications as above - Will have continued monitoring of liver function in CIR; can consider further workup as indicated

## 2024-08-24 NOTE — Assessment & Plan Note (Signed)
 Na of 137 today, normalized.

## 2024-08-25 ENCOUNTER — Encounter: Payer: Self-pay | Admitting: *Deleted

## 2024-08-25 ENCOUNTER — Other Ambulatory Visit (HOSPITAL_COMMUNITY): Payer: Self-pay

## 2024-08-25 DIAGNOSIS — S88112A Complete traumatic amputation at level between knee and ankle, left lower leg, initial encounter: Secondary | ICD-10-CM

## 2024-08-25 LAB — CBC WITH DIFFERENTIAL/PLATELET
Basophils Absolute: 0 K/uL (ref 0.0–0.1)
Basophils Relative: 0 %
Eosinophils Absolute: 0.1 K/uL (ref 0.0–0.5)
Eosinophils Relative: 1 %
HCT: 31.5 % — ABNORMAL LOW (ref 39.0–52.0)
Hemoglobin: 10.2 g/dL — ABNORMAL LOW (ref 13.0–17.0)
Lymphocytes Relative: 12 %
Lymphs Abs: 1.5 K/uL (ref 0.7–4.0)
MCH: 26.8 pg (ref 26.0–34.0)
MCHC: 32.4 g/dL (ref 30.0–36.0)
MCV: 82.7 fL (ref 80.0–100.0)
Monocytes Absolute: 0.6 K/uL (ref 0.1–1.0)
Monocytes Relative: 5 %
Neutro Abs: 10.4 K/uL — ABNORMAL HIGH (ref 1.7–7.7)
Neutrophils Relative %: 82 %
Platelets: 424 K/uL — ABNORMAL HIGH (ref 150–400)
RBC: 3.81 MIL/uL — ABNORMAL LOW (ref 4.22–5.81)
RDW: 13.6 % (ref 11.5–15.5)
WBC: 12.7 K/uL — ABNORMAL HIGH (ref 4.0–10.5)
nRBC: 0 % (ref 0.0–0.2)

## 2024-08-25 LAB — COMPREHENSIVE METABOLIC PANEL WITH GFR
ALT: 83 U/L — ABNORMAL HIGH (ref 0–44)
AST: 128 U/L — ABNORMAL HIGH (ref 15–41)
Albumin: 1.9 g/dL — ABNORMAL LOW (ref 3.5–5.0)
Alkaline Phosphatase: 205 U/L — ABNORMAL HIGH (ref 38–126)
Anion gap: 13 (ref 5–15)
BUN: 20 mg/dL (ref 6–20)
CO2: 28 mmol/L (ref 22–32)
Calcium: 8 mg/dL — ABNORMAL LOW (ref 8.9–10.3)
Chloride: 92 mmol/L — ABNORMAL LOW (ref 98–111)
Creatinine, Ser: 1.23 mg/dL (ref 0.61–1.24)
GFR, Estimated: >60 mL/min (ref >60)
Glucose, Bld: 192 mg/dL — ABNORMAL HIGH (ref 70–99)
Potassium: 4.2 mmol/L (ref 3.5–5.1)
Sodium: 133 mmol/L — ABNORMAL LOW (ref 135–145)
Total Bilirubin: 0.7 mg/dL (ref 0.0–1.2)
Total Protein: 6.5 g/dL (ref 6.5–8.1)

## 2024-08-25 LAB — GLUCOSE, CAPILLARY
Glucose-Capillary: 194 mg/dL — ABNORMAL HIGH (ref 70–99)
Glucose-Capillary: 197 mg/dL — ABNORMAL HIGH (ref 70–99)
Glucose-Capillary: 195 mg/dL — ABNORMAL HIGH (ref 70–99)
Glucose-Capillary: 219 mg/dL — ABNORMAL HIGH (ref 70–99)

## 2024-08-25 LAB — SURGICAL PATHOLOGY

## 2024-08-25 MED ORDER — VITAMIN D 25 MCG (1000 UNIT) PO TABS
1000.0000 [IU] | ORAL_TABLET | Freq: Every day | ORAL | Status: DC
  Administered 2024-08-25 – 2024-08-27 (×3): 1000 [IU] via ORAL
  Filled 2024-08-25 (×3): qty 1

## 2024-08-25 MED ORDER — PROSOURCE PLUS PO LIQD
30.0000 mL | Freq: Two times a day (BID) | ORAL | Status: DC
  Administered 2024-08-25 – 2024-08-27 (×4): 30 mL via ORAL
  Filled 2024-08-25 (×4): qty 30

## 2024-08-25 MED ORDER — B COMPLEX-C PO TABS
1.0000 | ORAL_TABLET | Freq: Every day | ORAL | Status: DC
  Administered 2024-08-25 – 2024-09-04 (×11): 1 via ORAL
  Filled 2024-08-25 (×11): qty 1

## 2024-08-25 MED ORDER — ACARBOSE 25 MG PO TABS
25.0000 mg | ORAL_TABLET | Freq: Three times a day (TID) | ORAL | Status: DC
  Administered 2024-08-25 – 2024-08-27 (×7): 25 mg via ORAL
  Filled 2024-08-25 (×7): qty 1

## 2024-08-25 MED ORDER — JUVEN PO PACK
1.0000 | PACK | Freq: Two times a day (BID) | ORAL | Status: DC
  Administered 2024-08-25 – 2024-09-04 (×20): 1 via ORAL
  Filled 2024-08-25 (×17): qty 1

## 2024-08-25 MED ORDER — ATORVASTATIN CALCIUM 10 MG PO TABS
20.0000 mg | ORAL_TABLET | Freq: Every day | ORAL | Status: DC
  Administered 2024-08-26: 20 mg via ORAL
  Filled 2024-08-25: qty 2

## 2024-08-25 MED ORDER — L-METHYLFOLATE-B6-B12 3-35-2 MG PO TABS
1.0000 | ORAL_TABLET | Freq: Every day | ORAL | Status: DC
  Administered 2024-08-25 – 2024-09-04 (×11): 1 via ORAL
  Filled 2024-08-25 (×11): qty 1

## 2024-08-25 NOTE — Plan of Care (Signed)
  Problem: RH Eating Goal: LTG Patient will perform eating w/assist, cues/equip (OT) Description: LTG: Patient will perform eating with assist, with/without cues using equipment (OT) Flowsheets (Taken 08/25/2024 1801) LTG: Pt will perform eating with assistance level of: Independent   Problem: RH Grooming Goal: LTG Patient will perform grooming w/assist,cues/equip (OT) Description: LTG: Patient will perform grooming with assist, with/without cues using equipment (OT) Flowsheets (Taken 08/25/2024 1801) LTG: Pt will perform grooming with assistance level of: Independent with assistive device    Problem: RH Bathing Goal: LTG Patient will bathe all body parts with assist levels (OT) Description: LTG: Patient will bathe all body parts with assist levels (OT) Flowsheets (Taken 08/25/2024 1801) LTG: Pt will perform bathing with assistance level/cueing: Independent with assistive device    Problem: RH Dressing Goal: LTG Patient will perform upper body dressing (OT) Description: LTG Patient will perform upper body dressing with assist, with/without cues (OT). Flowsheets (Taken 08/25/2024 1801) LTG: Pt will perform upper body dressing with assistance level of: Independent with assistive device   Problem: RH Toilet Transfers Goal: LTG Patient will perform toilet transfers w/assist (OT) Description: LTG: Patient will perform toilet transfers with assist, with/without cues using equipment (OT) Flowsheets (Taken 08/25/2024 1801) LTG: Pt will perform toilet transfers with assistance level of: Independent with assistive device   Problem: RH Tub/Shower Transfers Goal: LTG Patient will perform tub/shower transfers w/assist (OT) Description: LTG: Patient will perform tub/shower transfers with assist, with/without cues using equipment (OT) Flowsheets (Taken 08/25/2024 1801) LTG: Pt will perform tub/shower stall transfers with assistance level of: Independent with assistive device

## 2024-08-25 NOTE — Progress Notes (Signed)
 Inpatient Rehabilitation Admission Medication Review by a Pharmacist  A complete drug regimen review was completed for this patient to identify any potential clinically significant medication issues.  High Risk Drug Classes Is patient taking? Indication by Medication  Antipsychotic Yes, as an intravenous medication Compazine - nausea  Anticoagulant Yes Enoxaparin  - VTE prophylaxis   Antibiotic Yes, as an intravenous medication Penicillin  G IV continuous infusion - group B strep bacteremia infection   Opioid Yes Tramadol  - pain  Antiplatelet Yes Aspirin  -  PAD  Hypoglycemics/insulin  Yes Acarbose, insulin  aspart, insulin  semglee  - T2DM  Vasoactive Medication Yes Amlodipine  -HTN  Chemotherapy No   Other Yes Albuterol , montelukast  - Restrictive lung disease Atorvastatin  - HLD B complex+C, metanx, vit D- supplements Bisacodyl , fleet enema- -constipation Famotidine  -GERD Ketorolac  - pain management Trazodone  - sleep     Type of Medication Issue Identified Description of Issue Recommendation(s)  Drug Interaction(s) (clinically significant)     Duplicate Therapy     Allergy     No Medication Administration End Date     Incorrect Dose     Additional Drug Therapy Needed     Significant med changes from prior encounter (inform family/care partners about these prior to discharge). PTA medications:  Cetirizine  hydrochlorothiazide  ibuprofen , APAP, and Requip .   Atorvastatin  dose reduced to 20mg  daily due to LFTs uptrending   Insulin  Lispro PTA>  substituting insulin  aspart here per P&T policy.  Insulin  glargine (Basaglar ) kwikpen PTA>   substituting insulin  semglee  here per P&T policy.  Restart PTA meds when and if necessary during CIR admission or at time of discharge, if warranted.   Resume PTA insulin  on discharge if appropriate   Communicate medication changes  to patient /family/ caregiver prior to discharge.   Other       Clinically significant medication issues were  identified that warrant physician communication and completion of prescribed/recommended actions by midnight of the next day:  No  Name of provider notified for urgent issues identified:   Provider Method of Notification:     Pharmacist comments: I spoke to Damien Quiet, ID . ID consulted on [facility] acute encouter and continuing to follow patient. Patient had listed penicillin  allergy (childhood reaction unknown) but has tolerated amoxicillin  and Unasyn  on multiple occasions without issues or reported reactions. Started PCN G potassium IV continuous infusion. No adverse reactions. ID pharmacist removed the PCN allergy from patient report during [facility] acute admissio and instructed patient that he should not report that he is allergic to PCN as he is no longer allergic to PCN. We have removed the PCN from his allergy profile. Only add PCN back as allergy if he develops a new adverse reaction.    Time spent performing this drug regimen review (minutes):  45   Levorn Gaskins, [place] Clinical Pharmacist 08/25/2024 10:42 AM

## 2024-08-25 NOTE — Progress Notes (Signed)
 [name], male   DOB: 1971/09/02, 53 y.o.   MRN: [id number] Met with the patient to review current medical situation, gangrene/OM with left BKA. Old right BKA; familiar with [employer] for prosthetic. Reviewed team conference, and plan of care. Discussed secondary risk management including DM (A1C 6.5), HTN, HLD (Trig 229) and low iron and calcium  level with medication and dietary modification recommendations. Concerned about IV abx.s; MD/PA made aware and declined MRI due to claustrophobia. Reports pain is managed.  Continue to follow along to address educational needs to faciltate preparation for discharge. Fredericka Barnie NOVAK

## 2024-08-25 NOTE — Evaluation (Signed)
 Occupational Therapy Assessment and Plan  Patient Details  Name: Sean Carter MRN: [id number] Date of Birth: 08-19-1971  OT Diagnosis: abnormal posture, acute pain, and muscle weakness (generalized) Rehab Potential: Rehab Potential (ACUTE ONLY): Good ELOS: 7-10 days   Today's Date: 08/25/2024 OT Individual Time: 9067-8967 OT Individual Time Calculation (min): 60 min     Hospital Problem: Principal Problem:   Below-knee amputation of left lower extremity ([name]) Active Problems:   S/P BKA (below knee amputation) unilateral, right ([name])   Past Medical History:  Past Medical History:  Diagnosis Date   Blurry vision 07/10/2020   Diabetes mellitus without complication ([name])    Gangrene of toe of left foot ([name])    Peripheral arterial disease    Sepsis due to Streptococcus, group B ([name]) 06/20/2020   Streptococcal bacteremia    Past Surgical History:  Past Surgical History:  Procedure Laterality Date   ABDOMINAL AORTOGRAM W/LOWER EXTREMITY N/A 01/18/2023   Procedure: ABDOMINAL AORTOGRAM W/LOWER EXTREMITY;  Surgeon: Eliza Lonni RAMAN, MD;  Location: [facility] INVASIVE CV LAB;  Service: Cardiovascular;  Laterality: N/A;   AMPUTATION Left 05/31/2020   Procedure: Ray Amputation of Left Great Toe with Negative Pressure Vac Placement;  Surgeon: Gretta Lonni PARAS, MD;  Location: [facility] OR;  Service: Vascular;  Laterality: Left;   AMPUTATION Right 07/22/2022   Procedure: RIGHT FIRST AND SECOND RAY PARTIAL  AMPUTATION;  Surgeon: Magda Debby SAILOR, MD;  Location: [facility] OR;  Service: Vascular;  Laterality: Right;   AMPUTATION Right 09/02/2023   Procedure: AMPUTATION BELOW KNEE;  Surgeon: Serene Gaile ORN, MD;  Location: [facility] OR;  Service: Vascular;  Laterality: Right;   AMPUTATION Left 08/22/2024   Procedure: LEFT AMPUTATION BELOW KNEE;  Surgeon: Sheree Penne Lonni, MD;  Location: [facility] OR;  Service: Vascular;  Laterality: Left;   APPLICATION OF WOUND VAC Right 01/22/2023   Procedure: APPLICATION OF  WOUND VAC, RIGHT FOOT;  Surgeon: Sheree Penne Lonni, MD;  Location: [facility] OR;  Service: Vascular;  Laterality: Right;   INCISION AND DRAINAGE OF WOUND Right 01/22/2023   Procedure: WASHOUT OF RIGHT FOOT WOUND;  Surgeon: Sheree Penne Lonni, MD;  Location: [facility] OR;  Service: Vascular;  Laterality: Right;   PERIPHERAL VASCULAR BALLOON ANGIOPLASTY  01/18/2023   Procedure: PERIPHERAL VASCULAR BALLOON ANGIOPLASTY;  Surgeon: Eliza Lonni RAMAN, MD;  Location: [facility] INVASIVE CV LAB;  Service: Cardiovascular;;   STUMP REVISION Right 01/22/2023   Procedure: PARTIAL CLOSURE OF RIGHT FOOT WOUND;  Surgeon: Sheree Penne Lonni, MD;  Location: [facility] OR;  Service: Vascular;  Laterality: Right;   TRANSMETATARSAL AMPUTATION Right 01/19/2023   Procedure: TRANSMETATARSAL AMPUTATION;  Surgeon: Sheree Penne Lonni, MD;  Location: [facility] OR;  Service: Vascular;  Laterality: Right;    Assessment & Plan Clinical Impression: Nazier Neyhart is a 53 year old male with history of T2DM (A1c 6.5) with diabetic retinopathy, PAD s/p multiple R-BKA 08/2023 after multiple procedures and developed left foot pain for 3 weeks progressing to fever and chills 2-3 days PTA on 08/21/24. He was found to have gangrenous changes of most of his foot with malodor which was felt to be non-salvageable. He was started on IV Vanc/Zosyn  and underwent L-BKA on 11/04 by Dr. Sheree. Blood cultures done showed Group B strep and antibiotics changed to PCN with recommendations to rule out diskitis/OM of spine and TTE. He was unable to tolerate MRI (and refuses to get it done under anesthesia) and 2D echo done revealing EF 55-60% and mild thickening of AV/MV. Leucocytosis resolving  with WBC down form 19.7-->10.9 and ABLA noted. PT/OT consulted and patient requires CGA for ADLs and mobility. He was working PTA and CIR recommended due to functional decline.  Patient transferred to CIR on 08/24/2024 .    Patient currently requires mod with basic self-care  skills and IADL secondary to muscle weakness, decreased cardiorespiratoy endurance, decreased coordination, and decreased sitting balance, decreased postural control, and decreased balance strategies.  Prior to hospitalization, patient could complete ADLs, IADLs, working full time, pt completed functional mobility with RLE prosthetic with modified independent .  Patient will benefit from skilled intervention to increase independence with basic self-care skills and increase level of independence with iADL prior to discharge home with care partner.  Anticipate patient will require minimal physical assistance and follow up outpatient.      OT Evaluation Precautions/Restrictions  Precautions Precautions: Fall Required Braces or Orthoses: Other Brace Other Brace: L limb protector Restrictions Weight Bearing Restrictions Per Provider Order: Yes LLE Weight Bearing Per Provider Order: Non weight bearing Home Living/Prior Functioning Home Living Family/patient expects to be discharged to:: Private residence Living Arrangements: Spouse/significant other Available Help at Discharge: Family, Available 24 hours/day Type of Home: House Home Access: Level entry Home Layout: Two level, Bed/bath upstairs, 1/2 bath on main level, Able to live on main level with bedroom/bathroom Alternate Level Stairs-Number of Steps: 16 Alternate Level Stairs-Rails: Right Bathroom Shower/Tub: Psychologist, counselling, Associate Professor: Yes  Lives With: Spouse, Family, Daughter Prior Function Level of Independence: Independent with basic ADLs, Independent with homemaking with ambulation, Independent with gait, Independent with transfers  Able to Take Stairs?: Yes Driving: Yes Vocation: Full time employment 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a restaurant Vision Baseline Vision/History: 1 Wears glasses;5 Retinopathy Ability to See in Adequate Light: 0 Adequate Patient Visual  Report: No change from baseline Vision Assessment?: No apparent visual deficits Perception  Perception: Within Functional Limits Praxis Praxis: WFL Cognition Cognition Overall Cognitive Status: Within Functional Limits for tasks assessed Arousal/Alertness: Awake/alert Orientation Level: Person;Place;Situation Person: Oriented Place: Oriented Situation: Oriented Memory: Appears intact Awareness: Appears intact Problem Solving: Appears intact Safety/Judgment: Appears intact Brief Interview for Mental Status (BIMS) Repetition of Three Words (First Attempt): 3 Temporal Orientation: Year: Correct Temporal Orientation: Month: Accurate within 5 days Temporal Orientation: Day: Correct Recall: Sock: Yes, no cue required Recall: Blue: Yes, no cue required Recall: Bed: Yes, no cue required BIMS Summary Score: 15 Sensation Sensation Light Touch: Appears Intact Hot/Cold: Not tested Proprioception: Appears Intact Stereognosis: Not tested Coordination Gross Motor Movements are Fluid and Coordinated: Yes Fine Motor Movements are Fluid and Coordinated: No Coordination and Movement Description: deficts d/t prior surgery Finger Nose Finger Test: [facility] Motor  Motor Motor: Within Functional Limits Motor - Skilled Clinical Observations: mainly limited by pain  Trunk/Postural Assessment  Cervical Assessment Cervical Assessment: Within Functional Limits Thoracic Assessment Thoracic Assessment: Within Functional Limits Lumbar Assessment Lumbar Assessment: Within Functional Limits Postural Control Postural Control: Within Functional Limits  Balance Balance Balance Assessed: Yes Static Sitting Balance Static Sitting - Balance Support: Left upper extremity supported;Right upper extremity supported Static Sitting - Level of Assistance: 5: Stand by assistance Dynamic Sitting Balance Dynamic Sitting - Balance Support: Left upper extremity supported;Right upper extremity  supported Dynamic Sitting - Level of Assistance: 5: Stand by assistance Dynamic Sitting - Balance Activities: Reaching for objects;Lateral lean/weight shifting Extremity/Trunk Assessment RUE Assessment RUE Assessment: Within Functional Limits LUE Assessment LUE Assessment: Within Functional Limits  Care Tool Care Tool Self Care Eating  Eating Assist Level: Independent    Oral Care    Oral Care Assist Level: Independent with assistive device    Bathing   Body parts bathed by patient: Right arm;Left arm;Chest;Abdomen;Front perineal area;Buttocks;Right upper leg;Left upper leg;Face   Body parts n/a: Left lower leg;Right lower leg Assist Level: Moderate Assistance - Patient 50 - 74%    Upper Body Dressing(including orthotics)   What is the patient wearing?: Hospital gown only   Assist Level: Minimal Assistance - Patient > 75%    Lower Body Dressing (excluding footwear)   What is the patient wearing?: Ace wrap/stump shrinker;Orthosis Assist for lower body dressing: Moderate Assistance - Patient 50 - 74%    Putting on/Taking off footwear Putting on/taking off footwear activity did not occur: Safety/medical concerns (B BKA)           Care Tool Toileting Toileting activity   Assist for toileting: Moderate Assistance - Patient 50 - 74%     Care Tool Bed Mobility Roll left and right activity   Roll left and right assist level: Independent with assistive device    Sit to lying activity   Sit to lying assist level: Independent with assistive device    Lying to sitting on side of bed activity   Lying to sitting on side of bed assist level: the ability to move from lying on the back to sitting on the side of the bed with no back support.: Independent with assistive device     Care Tool Transfers Sit to stand transfer Sit to stand activity did not occur: Safety/medical concerns (B BKA)      Chair/bed transfer   Chair/bed transfer assist level: Moderate Assistance - Patient  50 - 74%     Toilet transfer   Assist Level: Moderate Assistance - Patient 50 - 74%     Care Tool Cognition  Expression of Ideas and Wants Expression of Ideas and Wants: 4. Without difficulty (complex and basic) - expresses complex messages without difficulty and with speech that is clear and easy to understand  Understanding Verbal and Non-Verbal Content Understanding Verbal and Non-Verbal Content: 4. Understands (complex and basic) - clear comprehension without cues or repetitions   Memory/Recall Ability Memory/Recall Ability : Current season;Location of own room;Staff names and faces;That he or she is in a hospital/hospital unit   Refer to Care Plan for Long Term Goals  SHORT TERM GOAL WEEK 1 OT Short Term Goal 1 (Week 1): STG=LTG d/t ELOS  Recommendations for other services: Therapeutic Recreation  Pet therapy and Stress management   Skilled Therapeutic Intervention 1:1 evaluation and treatment session initiated this date. OT roles, goals and purpose discussed with pt as well as therapy schedule. ADL completed this date with levels of assist listed above. Pt completed all functional transfers this session lateral scoots with Mod A. Pt currently limited by pain in LLE, OT provided pain intervention with repositioning and rest breaks. Pt would benefit from skilled OT in IPR setting in order to maximize independence with ADLs upon D/C.   ADL ADL Eating: Modified independent Where Assessed-Eating: Bed level Grooming: Modified independent Where Assessed-Grooming: Wheelchair Upper Body Bathing: Minimal assistance Where Assessed-Upper Body Bathing: Wheelchair Lower Body Bathing: Moderate assistance Where Assessed-Lower Body Bathing: Wheelchair Upper Body Dressing: Setup Where Assessed-Upper Body Dressing: Wheelchair Lower Body Dressing: Moderate assistance Where Assessed-Lower Body Dressing: Wheelchair Toileting: Moderate assistance Where Assessed-Toileting: Public House Manager: Moderate assistance Toilet Transfer Method: Other (comment) (lateral scoot) Walk-In Shower Transfer: Unable to assess (Pt  unable to shower at this time) Mobility  Bed Mobility Bed Mobility: Rolling Right;Rolling Left;Supine to Sit;Sit to Supine Rolling Right: Independent with assistive device Rolling Left: Independent with assistive device Supine to Sit: Contact Guard/Touching assist Sit to Supine: Supervision/Verbal cueing   Discharge Criteria: Patient will be discharged from OT if patient refuses treatment 3 consecutive times without medical reason, if treatment goals not met, if there is a change in medical status, if patient makes no progress towards goals or if patient is discharged from hospital.  The above assessment, treatment plan, treatment alternatives and goals were discussed and mutually agreed upon: by patient  Alyzabeth Pontillo T [place] 08/25/2024, 12:58 PM

## 2024-08-25 NOTE — Progress Notes (Signed)
 Inpatient Rehabilitation  Patient information reviewed and entered into eRehab system by [facility]. Karen Kays., CCC/SLP, PPS Coordinator.  Information including medical coding, functional ability and quality indicators will be reviewed and updated through discharge.

## 2024-08-25 NOTE — Progress Notes (Signed)
 PROGRESS NOTE   Subjective/Complaints: Patient states he has PCN allergy, asked PA/nursing to contact pharmacy to change to alternative antibiotic He asks about timeframe for wound healing  ROS: pain is well controlled   Objective:   MR THORACIC SPINE WO CONTRAST Result Date: 08/23/2024 EXAM: MR Thoracic Spine without 08/23/2024 07:52:26 PM TECHNIQUE: Multiplanar multisequence MRI of the thoracic spine was performed without the administration of intravenous contrast. COMPARISON: None available CLINICAL HISTORY: Back pain and bacteremia, concern for discitis. FINDINGS: Incomplete study due to termination secondary to patient claustrophobia. Only sagittal T2 and STIR performed. BONES AND ALIGNMENT: Normal alignment. Normal vertebral body heights. At T12-L1, endplate signal changes that are largely STIR hypointense without definite/obvious disc edema. This level is incompletely imaged. No marrow or disc edema at other levels. SPINAL CORD: Normal spinal cord volume. Normal spinal cord signal. SOFT TISSUES: Unremarkable on limited assessment. DEGENERATIVE CHANGES: Limited evaluation without axial. Multiple small disc protrusions without high grade canal or foraminal stenosis. IMPRESSION: 1. Incomplete study with early termination secondary to patient claustrophobia. Only sagittal T2 and STIR performed. 2. At T12-L1, endplate signal changes which appear degenerative but are incompletely imaged. To fully exclude discitis, recommend completing MRI of the lumbar spine (preferably with contrast) if the patient is able. 3. No obvious evidence of discitis/osteomyelitis at other thoracic levels. 4. No high-grade canal or foraminal stenosis in the thoracic spine. Electronically signed by: Gilmore Molt MD 08/23/2024 08:32 PM EST RP Workstation: [id number]   ECHOCARDIOGRAM COMPLETE Result Date: 08/23/2024    ECHOCARDIOGRAM REPORT   Patient Name:   Sean Carter Date of Exam: 08/23/2024 Medical Rec #:  [id number]     Height:       67.0 in Accession #:    [id number]    Weight:       220.0 lb Date of Birth:  Feb 22, 1971    BSA:          2.106 m Patient Age:    53 years      BP:           143/73 mmHg Patient Gender: M             HR:           77 bpm. Exam Location:  Inpatient Procedure: 2D Echo (Both Spectral and Color Flow Doppler were utilized during            procedure). Indications:    Bacteremia  History:        Patient has prior history of Echocardiogram examinations. Risk                 Factors:Hypertension.  Sonographer:    Charmaine Gaskins Referring Phys: COREAN LOISE FIREMAN IMPRESSIONS  1. Left ventricular ejection fraction, by estimation, is 60 to 65%. The left ventricle has normal function. The left ventricle has no regional wall motion abnormalities. Left ventricular diastolic parameters are indeterminate.  2. Right ventricular systolic function is normal. The right ventricular size is normal. There is normal pulmonary artery systolic pressure.  3. The mitral valve is normal in structure. Trivial mitral valve regurgitation. No evidence of mitral stenosis.  4. Aortic valve abnormalities are unchanged  compared with the echo 05/2020. The aortic valve is tricuspid. There is mild calcification of the aortic valve. There is mild thickening of the aortic valve. Aortic valve regurgitation is not visualized. No aortic stenosis is present.  5. The inferior vena cava is normal in size with greater than 50% respiratory variability, suggesting right atrial pressure of 3 mmHg. FINDINGS  Left Ventricle: Left ventricular ejection fraction, by estimation, is 60 to 65%. The left ventricle has normal function. The left ventricle has no regional wall motion abnormalities. The left ventricular internal cavity size was normal in size. There is  no left ventricular hypertrophy. Left ventricular diastolic parameters are indeterminate. Indeterminate filling pressures. Right Ventricle:  The right ventricular size is normal. No increase in right ventricular wall thickness. Right ventricular systolic function is normal. There is normal pulmonary artery systolic pressure. The tricuspid regurgitant velocity is 2.25 m/s, and  with an assumed right atrial pressure of 3 mmHg, the estimated right ventricular systolic pressure is 23.2 mmHg. Left Atrium: Left atrial size was normal in size. Right Atrium: Right atrial size was normal in size. Pericardium: There is no evidence of pericardial effusion. Mitral Valve: The mitral valve is normal in structure. There is mild thickening of the mitral valve leaflet(s). Trivial mitral valve regurgitation. No evidence of mitral valve stenosis. Tricuspid Valve: The tricuspid valve is normal in structure. Tricuspid valve regurgitation is mild . No evidence of tricuspid stenosis. Aortic Valve: Aortic valve abnormalities are unchanged compared with the echo 05/2020. The aortic valve is tricuspid. There is mild calcification of the aortic valve. There is mild thickening of the aortic valve. Aortic valve regurgitation is not visualized. No aortic stenosis is present. Pulmonic Valve: The pulmonic valve was normal in structure. Pulmonic valve regurgitation is not visualized. No evidence of pulmonic stenosis. Aorta: The aortic root is normal in size and structure. Venous: The inferior vena cava is normal in size with greater than 50% respiratory variability, suggesting right atrial pressure of 3 mmHg. IAS/Shunts: No atrial level shunt detected by color flow Doppler.  LEFT VENTRICLE PLAX 2D LVIDd:         5.00 cm   Diastology LVIDs:         2.80 cm   LV e' medial:    7.29 cm/s LV PW:         0.90 cm   LV E/e' medial:  14.5 LV IVS:        0.90 cm   LV e' lateral:   11.30 cm/s LVOT diam:     2.00 cm   LV E/e' lateral: 9.4 LVOT Area:     3.14 cm  RIGHT VENTRICLE RV Basal diam:  2.40 cm RV Mid diam:    3.00 cm RV S prime:     12.10 cm/s LEFT ATRIUM             Index        RIGHT  ATRIUM           Index LA diam:        3.80 cm 1.80 cm/m   RA Area:     10.30 cm LA Vol (A2C):   61.5 ml 29.20 ml/m  RA Volume:   17.80 ml  8.45 ml/m LA Vol (A4C):   40.7 ml 19.33 ml/m LA Biplane Vol: 53.7 ml 25.50 ml/m   AORTA Ao Root diam: 3.00 cm Ao Asc diam:  3.30 cm MITRAL VALVE  TRICUSPID VALVE MV Area (PHT): 6.02 cm     TR Peak grad:   20.2 mmHg MV Decel Time: 126 msec     TR Vmax:        225.00 cm/s MV E velocity: 106.00 cm/s MV A velocity: 85.40 cm/s   SHUNTS MV E/A ratio:  1.24         Systemic Diam: 2.00 cm Annabella Scarce MD Electronically signed by Annabella Scarce MD Signature Date/Time: 08/23/2024/5:34:12 PM    Final    Recent Labs    08/24/24 0313 08/25/24 0523  WBC 10.9* 12.7*  HGB 9.9* 10.2*  HCT 30.5* 31.5*  PLT 337 424*   Recent Labs    08/24/24 0313 08/25/24 0523  NA 137 133*  K 3.5 4.2  CL 96* 92*  CO2 28 28  GLUCOSE 112* 192*  BUN 19 20  CREATININE 1.00 1.23  CALCIUM  7.9* 8.0*    Intake/Output Summary (Last 24 hours) at 08/25/2024 1028 Last data filed at 08/25/2024 0727 Gross per 24 hour  Intake 120 ml  Output 500 ml  Net -380 ml        Physical Exam: Vital Signs Blood pressure (!) 153/76, pulse 68, temperature 99.3 F (37.4 C), temperature source Oral, resp. rate 16, height 5' 8 (1.727 m), weight 104.3 kg, SpO2 97%. Gen: no distress, normal appearing HEENT: oral mucosa pink and moist, NCAT Cardio: Reg rate Chest: normal effort, normal rate of breathing Abdominal:     Palpations: Abdomen is soft.     Comments: Very protuberant; non distended; normoactive; Soft  Musculoskeletal:     Cervical back: Neck supple. No tenderness.     Comments: Ue's 5/5 throughout B/L RLE_ HF/KE/KF 5/5- has R BKA- with scabs on R BKA from standing 9-12 hours/day at work LLE- HF/KE/KF at least 4/5- bulbous L BKA  Skin:    General: Skin is warm.     Comments: A couple of open and abraded areas on R-BKA. L-BKA with dry dressing.  Per last pic- has  oozing from incision- dog ears (+) and bulbous L Forearm IV- not leaking anymore- constantly on IV Pen G  Neurological:     Mental Status: He is alert and oriented to person, place, and time.     Comments: Intact to light touch in all 4 extremities Ox3- keeps eyes closed due to fuzziness  Psychiatric:        Mood and Affect: Mood normal.        Behavior: Behavior normal.    Assessment/Plan: 1. Functional deficits which require 3+ hours per day of interdisciplinary therapy in a comprehensive inpatient rehab setting. Physiatrist is providing close team supervision and 24 hour management of active medical problems listed below. Physiatrist and rehab team continue to assess barriers to discharge/monitor patient progress toward functional and medical goals  Care Tool:  Bathing              Bathing assist       Upper Body Dressing/Undressing Upper body dressing        Upper body assist      Lower Body Dressing/Undressing Lower body dressing            Lower body assist       Toileting Toileting    Toileting assist       Transfers Chair/bed transfer  Transfers assist           Locomotion Ambulation   Ambulation assist  Walk 10 feet activity   Assist           Walk 50 feet activity   Assist           Walk 150 feet activity   Assist           Walk 10 feet on uneven surface  activity   Assist           Wheelchair     Assist               Wheelchair 50 feet with 2 turns activity    Assist            Wheelchair 150 feet activity     Assist          Blood pressure (!) 153/76, pulse 68, temperature 99.3 F (37.4 C), temperature source Oral, resp. rate 16, height 5' 8 (1.727 m), weight 104.3 kg, SpO2 97%.  Medical Problem List and Plan: 1. Functional deficits secondary to L BKA in setting of old R BKA due to osteomyelitis and gangrene             -patient may not shower              -ELOS/Goals: 7-10 days supervision to mod I at w/c level Chart and therapy notes reviewed, continue CIR, discussed patient's progress with therapy Grounds pass ordered Vitamin D3/Metanx/Vitamin B/C complex ordered F/u with me or Fidela in clinic within 1 month Would benefit from home health aide upon discharge   2.  Antithrombotics: -DVT/anticoagulation:  Pharmaceutical: Lovenox              -antiplatelet therapy: ASA 3. Pain Management: Hydrocodone--->changed to tramadol  prn.             --Ketorolac  X 5 days thru thru 11/08. Monitor renal status/BP stopped Norco due to LFT elevation 4. Mood/Behavior/Sleep: LCSW to follow for evaluation and support.              -antipsychotic agents: N/A 5. Neuropsych/cognition: This patient is capable of making decisions on his own behalf. 6. Skin/Wound Care: Routine pressure relief measures.  --Low carb protein supplements for wound healing.  7. Fluids/Electrolytes/Nutrition: Monitor I/O. Check CMET in am 8. Group B strep bactermia: Antibiotics changed to  PCN G             --MRI TL spine limited by claustrophobia.             --2 d echo OK  Asked PA/nursing to discuss alternative antibiotic with pharmacy as patient states he gets hives in response to PCN  9. T2DM: Hgb A1c 6.5 and well controlled on Insulin  glargine 40 units w/20 units lispro bid.  --Will monitor BS ac/hs and use SSI for elevated BS             --Increase basal insulin  to 30 units. Change SSI to sensitive. May need to resume meal coverage. Acarbose added  10.  HTN: Monitor BP TID--on amlodipine   11. Abnormal LFTs: decreased statin to 20mg  since uptrending, repeat tomorrow  12. ABLA: Recheck CBC in am.   13. Acute renal failure: Due to infection and has resolved.    LOS: 1 days A FACE TO FACE EVALUATION WAS PERFORMED  Sean Carter 08/25/2024, 10:28 AM

## 2024-08-25 NOTE — Progress Notes (Signed)
 ID Pharmacist Note   Went to check on patient from the ID team and answer any questions about his penicillin  allergy. He said he felt like he understood now but with everything going on just needed time to process the change since his mom and grandmother had told him he was allergic to penicillin  his whole life.   I also checked to see if he wanted to have a CT scan of his back. He opted not to at this time and is going to follow-up about images from his family physician that had worked up his back pain originally.    Damien Quiet, PharmD, BCPS, BCIDP Infectious Diseases Clinical Pharmacist Phone: [phone] 08/25/2024 2:08 PM

## 2024-08-25 NOTE — Progress Notes (Signed)
 Occupational Therapy Session Note  Patient Details  Name: Sean Carter MRN: [id number] Date of Birth: Nov 06, 1970  Today's Date: 08/25/2024 OT Individual Time: 8582-8471 OT Individual Time Calculation (min): 71 min    Short Term Goals: Week 1:  OT Short Term Goal 1 (Week 1): STG=LTG d/t ELOS  Skilled Therapeutic Interventions/Progress Updates:  Skilled OT session completed to address limb loss education and pain management. Pt received supine in bed, agreeable to participate in therapy. Pt reports 7/10 pain in LLE, OT provided pain intervention by repositioning.    Pt reporting cramping sensation in LLE, OT instructed pt on lying in prone to stretch hamstring muscles. Pt reporting decrease in pain, OT provided education on importance of stretching to reduce risk of contractures at hip flexors. Pt is unfamiliar with terminology regarding BKA and requesting more information. OT provided limb loss education on residual limb care, skin checks, shrinker care, ACE wrap, desensitization/ desensitization techniques, and timeline for prosthesis management. Following education, pt reporting not receiving similar support following R BKA and a lot of new information. OT reassures pt and will continue education throughout length of stay. OT also provided information on limb loss support groups, pt reports follow-up with surgeon support group following CIR stay. Pt requested to void, declined toilet transfer and completed with urinal. Pt supine in bed with bed alarm on and all needs within reach.   Therapy Documentation Precautions:  Restrictions Weight Bearing Restrictions Per Provider Order: Yes LLE Weight Bearing Per Provider Order: Non weight bearing    Therapy/Group: Individual Therapy  Amiria Orrison Woods-Chance, MS, OTR/L 08/25/2024, 7:57 AM

## 2024-08-25 NOTE — Plan of Care (Signed)
  RD consulted for nutrition education regarding diabetes.   Lab Results  Component Value Date   HGBA1C 6.5 (H) 08/22/2024    RD provided Power of Protein in Diabetes handout from the [employer]. Discussed different food groups and their effects on blood sugar, emphasizing carbohydrate-containing foods. Provided list of carbohydrates and recommended serving sizes of common foods. Discussed importance of incorporating protein at all meals and snacks especially when eating carbohydrates.   Discussed importance of controlled and consistent carbohydrate intake throughout the day. Provided examples of ways to balance meals/snacks and encouraged intake of high-fiber, whole grain complex carbohydrates. Teach back method used.  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 Pt reports eating 3x per day with maybe 1 snack. Pt reports he includes protein at all meals and usually snacks on flavored almonds or a sugar free protein bar, but avoids processed snacks, desserts, and sugar sweetened beverages. Pt reports he usually cooks his meat in a leaner way by grilling or baking. Pt reports his previous A1c had been > 8% but has since been able to decrease below 7 based on diet and blood sugar management with insulin . Pt reports at home when monitoring blood sugar his daily range would be between 70-145 mg/dL.  Expect good compliance.  Body mass index is 34.97 kg/m. Pt meets criteria for class I obesity based on current BMI.  Current diet order is Carb Mod, patient is consuming approximately 10-50% of meals at this time (reports he does not like food due to lack of flavor but is acceptable of Juven and Prosource supplements). Labs and medications reviewed. No further nutrition interventions warranted at this time. RD contact information provided. If additional nutrition issues arise, please re-consult RD.    Josette Glance, MS, RDN, LDN Clinical Dietitian I Please reach  out via secure chat

## 2024-08-26 DIAGNOSIS — D72829 Elevated white blood cell count, unspecified: Secondary | ICD-10-CM

## 2024-08-26 DIAGNOSIS — B955 Unspecified streptococcus as the cause of diseases classified elsewhere: Secondary | ICD-10-CM

## 2024-08-26 DIAGNOSIS — E1169 Type 2 diabetes mellitus with other specified complication: Secondary | ICD-10-CM | POA: Diagnosis not present

## 2024-08-26 DIAGNOSIS — R7401 Elevation of levels of liver transaminase levels: Secondary | ICD-10-CM

## 2024-08-26 DIAGNOSIS — S88112A Complete traumatic amputation at level between knee and ankle, left lower leg, initial encounter: Secondary | ICD-10-CM | POA: Diagnosis not present

## 2024-08-26 DIAGNOSIS — R7881 Bacteremia: Secondary | ICD-10-CM

## 2024-08-26 LAB — GLUCOSE, CAPILLARY
Glucose-Capillary: 152 mg/dL — ABNORMAL HIGH (ref 70–99)
Glucose-Capillary: 290 mg/dL — ABNORMAL HIGH (ref 70–99)
Glucose-Capillary: 221 mg/dL — ABNORMAL HIGH (ref 70–99)
Glucose-Capillary: 217 mg/dL — ABNORMAL HIGH (ref 70–99)

## 2024-08-26 LAB — COMPREHENSIVE METABOLIC PANEL WITH GFR
ALT: 89 U/L — ABNORMAL HIGH (ref 0–44)
AST: 115 U/L — ABNORMAL HIGH (ref 15–41)
Albumin: 2.1 g/dL — ABNORMAL LOW (ref 3.5–5.0)
Alkaline Phosphatase: 207 U/L — ABNORMAL HIGH (ref 38–126)
Anion gap: 13 (ref 5–15)
BUN: 20 mg/dL (ref 6–20)
CO2: 26 mmol/L (ref 22–32)
Calcium: 8.3 mg/dL — ABNORMAL LOW (ref 8.9–10.3)
Chloride: 93 mmol/L — ABNORMAL LOW (ref 98–111)
Creatinine, Ser: 1.04 mg/dL (ref 0.61–1.24)
GFR, Estimated: >60 mL/min (ref >60)
Glucose, Bld: 167 mg/dL — ABNORMAL HIGH (ref 70–99)
Potassium: 3.9 mmol/L (ref 3.5–5.1)
Sodium: 132 mmol/L — ABNORMAL LOW (ref 135–145)
Total Bilirubin: 0.7 mg/dL (ref 0.0–1.2)
Total Protein: 6.8 g/dL (ref 6.5–8.1)

## 2024-08-26 LAB — CBC WITH DIFFERENTIAL/PLATELET
Abs Immature Granulocytes: 0.21 K/uL — ABNORMAL HIGH (ref 0.00–0.07)
Basophils Absolute: 0.1 K/uL (ref 0.0–0.1)
Basophils Relative: 1 %
Eosinophils Absolute: 0.2 K/uL (ref 0.0–0.5)
Eosinophils Relative: 2 %
HCT: 32.5 % — ABNORMAL LOW (ref 39.0–52.0)
Hemoglobin: 10.8 g/dL — ABNORMAL LOW (ref 13.0–17.0)
Immature Granulocytes: 2 %
Lymphocytes Relative: 21 %
Lymphs Abs: 2.4 K/uL (ref 0.7–4.0)
MCH: 26.9 pg (ref 26.0–34.0)
MCHC: 33.2 g/dL (ref 30.0–36.0)
MCV: 80.8 fL (ref 80.0–100.0)
Monocytes Absolute: 1 K/uL (ref 0.1–1.0)
Monocytes Relative: 8 %
Neutro Abs: 7.9 K/uL — ABNORMAL HIGH (ref 1.7–7.7)
Neutrophils Relative %: 66 %
Platelets: 484 K/uL — ABNORMAL HIGH (ref 150–400)
RBC: 4.02 MIL/uL — ABNORMAL LOW (ref 4.22–5.81)
RDW: 13.3 % (ref 11.5–15.5)
WBC: 11.8 K/uL — ABNORMAL HIGH (ref 4.0–10.5)
nRBC: 0 % (ref 0.0–0.2)

## 2024-08-26 MED ORDER — ATORVASTATIN CALCIUM 10 MG PO TABS
10.0000 mg | ORAL_TABLET | Freq: Every day | ORAL | Status: DC
  Administered 2024-08-27: 10 mg via ORAL
  Filled 2024-08-26: qty 1

## 2024-08-26 NOTE — Progress Notes (Signed)
 Occupational Therapy Session Note  Patient Details  Name: Sean Carter MRN: [id number] Date of Birth: May 17, 1971  Session 1 Today's Date: 08/26/2024 OT Individual Time: 9083-8984 OT Individual Time Calculation (min): 59 min  Session 2 Today's Date: 08/26/2024 OT Individual Time: 9754-9669 OT Individual Time Calculation (min): 45 min    Short Term Goals: Week 1:  OT Short Term Goal 1 (Week 1): STG=LTG d/t ELOS  Skilled Therapeutic Interventions/Progress Updates:  Session 1: Skilled OT session completed to address ADL retraining, limb loss education, and functional transfers. Pt received supine in bed, agreeable to participate in therapy. Pt reports no pain.  OT provided 3XL shrinker, pt successfully recalls shrinker care mgmt from limb loss education previously. With RN present to inspect skin, OT removed ACE Wrap and gauze. Dehiscence noted laterally. OT dependently donned shrinker, pt attempts to don however experiencing increased pain at incision sight. Supine>EOB with Supervision A, VC for technique to reach EOB safely. EOB>WC with SB, CGA, OT placed SB dependently, continued to verbalize instruction to increase independence with SB transfers. Pt self-propelled to sink to complete ADLs. Pt completed oral care with independence seated in WC. Pt completed UB bathing and dressing with set-up A d/t IV in LUE. Pt requested to void, declined toilet transfer d/t urgency, OT provided urinal. Pt completed toileting hygiene with supervision when weight shifting to don/doff pants when performing peri care/LB bathing. Pt seated in WC continuing personal grooming, NT notified of status.   Session 2: Skilled OT session completed to address functional transfers, WC mgmt, and BLE strengthening. Pt received supine in bed, agreeable to participate in therapy. Pt reports no pain.  Pt declines self-care needs at this time. Supine>EOB Mod-I using bed rails. OT provided education on positioning of WC during  SB transfers to support direction of care in home environment. Pt verbalized understanding. OT instructed on independence with SB placement, pt has difficulty recalling correct placement of board. OT provides A with demo, Pt able to place board with Min A for positioning in WC. EOB>WC SB transfer CGA. Pt dependently propelled to main gym d/t managing IV. WC>EOM SB transfer with Min A to manage board when transferring. Pt completed 5x10 hip flexion and abduction exs to increase strength in BLE, 5lb weight donned on RLE. Pt displays limited hip abduction during transfers. Pt completed 3x10 modified crunches to increase core strength as it supports functional transfers. OT provided visual cue for core flexion with therapy bench to reduce posterior leaning during transfers and reduce risk for falls. Pt responds well to visual cue and completes EOM>WC lateral scoot without SB with CGA. Pt self-propelled back to room, WC>EOB with SB CGA, pt required VC for correct SB placement. Pt returned to supine with bed alarm on and all needs within reach.   Therapy Documentation Precautions:  Precautions Precautions: Fall Recall of Precautions/Restrictions: Intact Precaution/Restrictions Comments: healing wound on distal end of residual limb Required Braces or Orthoses: Other Brace Other Brace: L limb protector Restrictions Weight Bearing Restrictions Per Provider Order: Yes LLE Weight Bearing Per Provider Order: Non weight bearing  Therapy/Group: Individual Therapy  Travoris Bushey Woods-Chance, MS, OTR/L 08/26/2024, 7:43 AM

## 2024-08-26 NOTE — Progress Notes (Signed)
 PROGRESS NOTE   Subjective/Complaints: No new complaints this morning ID note reviewed, CT spine recommended to r/o discitis, ordered WBC reviewed and has improved  ROS: pain is well controlled, +constipation   Objective:   No results found.  Recent Labs    08/25/24 0523 08/26/24 0520  WBC 12.7* 11.8*  HGB 10.2* 10.8*  HCT 31.5* 32.5*  PLT 424* 484*   Recent Labs    08/25/24 0523 08/26/24 0520  NA 133* 132*  K 4.2 3.9  CL 92* 93*  CO2 28 26  GLUCOSE 192* 167*  BUN 20 20  CREATININE 1.23 1.04  CALCIUM  8.0* 8.3*    Intake/Output Summary (Last 24 hours) at 08/26/2024 1232 Last data filed at 08/26/2024 0700 Gross per 24 hour  Intake 537.55 ml  Output 2200 ml  Net -1662.45 ml        Physical Exam: Vital Signs Blood pressure (!) 146/72, pulse 62, temperature 98.9 F (37.2 C), temperature source Oral, resp. rate 18, height 5' 8 (1.727 m), weight 104.3 kg, SpO2 97%. Gen: no distress, normal appearing HEENT: oral mucosa pink and moist, NCAT Cardio: Reg rate Chest: normal effort, normal rate of breathing Abdominal:     Palpations: Abdomen is soft.     Comments: Very protuberant; non distended; normoactive; Soft  Musculoskeletal:     Cervical back: Neck supple. No tenderness.     Comments: Ue's 5/5 throughout B/L RLE_ HF/KE/KF 5/5- has R BKA- with scabs on R BKA from standing 9-12 hours/day at work LLE- HF/KE/KF at least 4/5- bulbous L BKA, stable 11/8 Skin:    General: Skin is warm.     Comments: A couple of open and abraded areas on R-BKA. L-BKA with dry dressing.  Per last pic- has oozing from incision- dog ears (+) and bulbous L Forearm IV- not leaking anymore- constantly on IV Pen G  Neurological:     Mental Status: He is alert and oriented to person, place, and time.     Comments: Intact to light touch in all 4 extremities Ox3- keeps eyes closed due to fuzziness  Psychiatric:        Mood and  Affect: Mood normal.        Behavior: Behavior normal.    Assessment/Plan: 1. Functional deficits which require 3+ hours per day of interdisciplinary therapy in a comprehensive inpatient rehab setting. Physiatrist is providing close team supervision and 24 hour management of active medical problems listed below. Physiatrist and rehab team continue to assess barriers to discharge/monitor patient progress toward functional and medical goals  Care Tool:  Bathing    Body parts bathed by patient: Right arm, Left arm, Chest, Abdomen, Front perineal area, Buttocks, Right upper leg, Left upper leg, Face     Body parts n/a: Left lower leg, Right lower leg   Bathing assist Assist Level: Moderate Assistance - Patient 50 - 74%     Upper Body Dressing/Undressing Upper body dressing   What is the patient wearing?: Hospital gown only    Upper body assist Assist Level: Minimal Assistance - Patient > 75%    Lower Body Dressing/Undressing Lower body dressing      What is the  patient wearing?: Ace wrap/stump shrinker, Orthosis     Lower body assist Assist for lower body dressing: Moderate Assistance - Patient 50 - 74%     Toileting Toileting    Toileting assist Assist for toileting: Moderate Assistance - Patient 50 - 74%     Transfers Chair/bed transfer  Transfers assist     Chair/bed transfer assist level: Minimal Assistance - Patient > 75%     Locomotion Ambulation   Ambulation assist   Ambulation activity did not occur: Safety/medical concerns (old R BKA, new L BKA)          Walk 10 feet activity   Assist  Walk 10 feet activity did not occur: Safety/medical concerns (old R BKA, new L BKA)        Walk 50 feet activity   Assist Walk 50 feet with 2 turns activity did not occur: Safety/medical concerns (old R BKA, new L BKA)         Walk 150 feet activity   Assist Walk 150 feet activity did not occur: Safety/medical concerns (old R BKA, new L BKA)          Walk 10 feet on uneven surface  activity   Assist Walk 10 feet on uneven surfaces activity did not occur: Safety/medical concerns (old R BKA, new L BKA)         Wheelchair     Assist Is the patient using a wheelchair?: Yes Type of Wheelchair: Manual    Wheelchair assist level: Supervision/Verbal cueing Max wheelchair distance: 25 ft    Wheelchair 50 feet with 2 turns activity    Assist        Assist Level: Minimal Assistance - Patient > 75%   Wheelchair 150 feet activity     Assist      Assist Level: Maximal Assistance - Patient 25 - 49%   Blood pressure (!) 146/72, pulse 62, temperature 98.9 F (37.2 C), temperature source Oral, resp. rate 18, height 5' 8 (1.727 m), weight 104.3 kg, SpO2 97%.  Medical Problem List and Plan: 1. Functional deficits secondary to L BKA in setting of old R BKA due to osteomyelitis and gangrene             -patient may not shower             -ELOS/Goals: 7-10 days supervision to mod I at w/c level Chart and therapy notes reviewed, continue CIR, discussed patient's progress with therapy Grounds pass ordered Vitamin D3/Metanx/Vitamin B/C complex ordered F/u with me or Fidela in clinic within 1 month Would benefit from home health aide upon discharge   2.  Antithrombotics: -DVT/anticoagulation:  Pharmaceutical: Lovenox              -antiplatelet therapy: ASA 3. Back pain: CT spine ordered to assess for discitis. Hydrocodone--->changed to tramadol  prn.             --Ketorolac  X 5 days thru thru 11/08. Monitor renal status/BP stopped Norco due to LFT elevation 4. Mood/Behavior/Sleep: LCSW to follow for evaluation and support.              -antipsychotic agents: N/A 5. Neuropsych/cognition: This patient is capable of making decisions on his own behalf. 6. Skin/Wound Care: Routine pressure relief measures.  --Low carb protein supplements for wound healing.  7. Fluids/Electrolytes/Nutrition: Monitor I/O. Check CMET in  am 8. Group B strep bactermia: Antibiotics changed to  PCN G             --  MRI TL spine limited by claustrophobia.             --2 d echo OK  Continue IV Penicillin , can switch to amoxicillin  upon d/c as per ID  9. T2DM: Hgb A1c 6.5 and well controlled on Insulin  glargine 40 units w/20 units lispro bid.  --Will monitor BS ac/hs and use SSI for elevated BS             --Increase basal insulin  to 30 units. Change SSI to sensitive. May need to resume meal coverage. Acarbose added  10.  HTN: Monitor BP TID--on amlodipine   11. Abnormal LFTs: decrease statin to 10mg  since uptrending, repeat tomorrow  12. ABLA: Recheck CBC in am.   13. Acute renal failure: Due to infection and has resolved.    LOS: 2 days A FACE TO FACE EVALUATION WAS PERFORMED  Sean Carter Sean Carter 08/26/2024, 12:32 PM

## 2024-08-26 NOTE — Plan of Care (Signed)
  Problem: Consults Goal: RH LIMB LOSS PATIENT EDUCATION Description: Description: See Patient Education module for eduction specifics. Outcome: Progressing Goal: Nutrition Consult-if indicated Outcome: Progressing Goal: Diabetes Guidelines if Diabetic/Glucose > 140 Description: If diabetic or lab glucose is > 140 mg/dl - Initiate Diabetes/Hyperglycemia Guidelines & Document Interventions  Outcome: Progressing   Problem: RH BOWEL ELIMINATION Goal: RH STG MANAGE BOWEL WITH ASSISTANCE Description: STG Manage Bowel with mod I Assistance. Outcome: Progressing Goal: RH STG MANAGE BOWEL W/MEDICATION W/ASSISTANCE Description: STG Manage Bowel with Medication with Assistance. Outcome: Progressing   Problem: RH SAFETY Goal: RH STG ADHERE TO SAFETY PRECAUTIONS W/ASSISTANCE/DEVICE Description: STG Adhere to Safety Precautions With cues Assistance/Device. Outcome: Progressing   Problem: RH PAIN MANAGEMENT Goal: RH STG PAIN MANAGED AT OR BELOW PT'S PAIN GOAL Description: < 4 with prns Outcome: Progressing   Problem: RH KNOWLEDGE DEFICIT LIMB LOSS Goal: RH STG INCREASE KNOWLEDGE OF SELF CARE AFTER LIMB LOSS Description: Patient will be able to manage care at discharge using educational resources for medication and dietary modification independently Outcome: Progressing

## 2024-08-26 NOTE — Progress Notes (Addendum)
 Physical Therapy Session Note  Patient Details  Name: Sean Carter MRN: [id number] Date of Birth: 1971/08/31  Today's Date: 08/26/2024 PT Individual Time: 8884-8844 + [id number] PT Individual Time Calculation (min): 40 min + 40 min  Short Term Goals: Week 1:  PT Short Term Goal 1 (Week 1): STG = LTG d/t ELOS  Skilled Therapeutic Interventions/Progress Updates:  Session 1: Chart reviewed and pt agreeable to therapy. Pt received seated in WC with 3/10 c/o pain at surgical site. Session focused on functional transfers and balance to promote safe home mobility and access. Pt initiated session by discussing previous R BKA experience with PT. Pt explained previous prosthetic was provided after 5 weeks of healing. PT educated pt on normal healing times and need to ensure tissues have finished reducing in size during the healing process. Pt verbalized understanding. PT and pt then discussed CIR level of care including goals for safe home access. Pt then demonstrated WC<>bed transfer using CGA + slideboard + assistance with slideboard placement for first transfer. Pt then completed quad sets with instructions from PT regarding risk and need to prevent contracture, with pt verbalizing agreement. At end of session, pt was left semi-reclined in bed with alarm engaged, nurse call bell and all needs in reach.  Session 2: Chart reviewed and pt agreeable to therapy. Pt received semi-reclined in bed with 1/10 c/o pain in surgical site. Session focused on functional transfers, balance, and strength exercises to promote safe home mobility and access and prevent contracture. Pt initiated session with transfer to EOB using S and slide board transfer to [facility] using light CGA. Pt then completed 369ft WC mobility using S. Pt then returned to bed using S + slideboard. Pt then completed exercises of hip ext and hip abd in sidelying. At end of session, pt was left semi-reclined in bed with alarm engaged, nurse call bell and all  needs in reach.       Therapy Documentation Precautions:  Precautions Precautions: Fall Recall of Precautions/Restrictions: Intact Precaution/Restrictions Comments: healing wound on distal end of residual limb Required Braces or Orthoses: Other Brace Other Brace: L limb protector Restrictions Weight Bearing Restrictions Per Provider Order: Yes LLE Weight Bearing Per Provider Order: Non weight bearing General:      Therapy/Group: Individual Therapy  [name] 08/26/2024, 12:19 PM

## 2024-08-26 NOTE — Evaluation (Addendum)
 Physical Therapy Assessment and Plan  Patient Details  Name: Sean Carter MRN: [id number] Date of Birth: 14-May-1971  PT Diagnosis: Difficulty walking, Muscle weakness, and Pain in LLE Rehab Potential: Excellent ELOS: 7-10 days   Today's Date: 08/25/2024 PT Individual Time: 1104-1201 PT Individual Time Calculation (min): 57 min    Hospital Problem: Principal Problem:   Below-knee amputation of left lower extremity ([name]) Active Problems:   S/P BKA (below knee amputation) unilateral, right ([name])   Past Medical History:  Past Medical History:  Diagnosis Date   Blurry vision 07/10/2020   Diabetes mellitus without complication ([name])    Gangrene of toe of left foot ([name])    Peripheral arterial disease    Sepsis due to Streptococcus, group B ([name]) 06/20/2020   Streptococcal bacteremia    Past Surgical History:  Past Surgical History:  Procedure Laterality Date   ABDOMINAL AORTOGRAM W/LOWER EXTREMITY N/A 01/18/2023   Procedure: ABDOMINAL AORTOGRAM W/LOWER EXTREMITY;  Surgeon: Eliza Lonni RAMAN, MD;  Location: [facility] INVASIVE CV LAB;  Service: Cardiovascular;  Laterality: N/A;   AMPUTATION Left 05/31/2020   Procedure: Ray Amputation of Left Great Toe with Negative Pressure Vac Placement;  Surgeon: Gretta Lonni PARAS, MD;  Location: [facility] OR;  Service: Vascular;  Laterality: Left;   AMPUTATION Right 07/22/2022   Procedure: RIGHT FIRST AND SECOND RAY PARTIAL  AMPUTATION;  Surgeon: Magda Debby SAILOR, MD;  Location: [facility] OR;  Service: Vascular;  Laterality: Right;   AMPUTATION Right 09/02/2023   Procedure: AMPUTATION BELOW KNEE;  Surgeon: Serene Gaile ORN, MD;  Location: [facility] OR;  Service: Vascular;  Laterality: Right;   AMPUTATION Left 08/22/2024   Procedure: LEFT AMPUTATION BELOW KNEE;  Surgeon: Sheree Penne Lonni, MD;  Location: [facility] OR;  Service: Vascular;  Laterality: Left;   APPLICATION OF WOUND VAC Right 01/22/2023   Procedure: APPLICATION OF WOUND VAC, RIGHT FOOT;  Surgeon: Sheree Penne Lonni, MD;  Location: [facility] OR;  Service: Vascular;  Laterality: Right;   INCISION AND DRAINAGE OF WOUND Right 01/22/2023   Procedure: WASHOUT OF RIGHT FOOT WOUND;  Surgeon: Sheree Penne Lonni, MD;  Location: [facility] OR;  Service: Vascular;  Laterality: Right;   PERIPHERAL VASCULAR BALLOON ANGIOPLASTY  01/18/2023   Procedure: PERIPHERAL VASCULAR BALLOON ANGIOPLASTY;  Surgeon: Eliza Lonni RAMAN, MD;  Location: [facility] INVASIVE CV LAB;  Service: Cardiovascular;;   STUMP REVISION Right 01/22/2023   Procedure: PARTIAL CLOSURE OF RIGHT FOOT WOUND;  Surgeon: Sheree Penne Lonni, MD;  Location: [facility] OR;  Service: Vascular;  Laterality: Right;   TRANSMETATARSAL AMPUTATION Right 01/19/2023   Procedure: TRANSMETATARSAL AMPUTATION;  Surgeon: Sheree Penne Lonni, MD;  Location: [facility] OR;  Service: Vascular;  Laterality: Right;    Assessment & Plan Clinical Impression: Patient is a 53 y.o. male with history of T2DM (A1c 6.5) with diabetic retinopathy, PAD s/p multiple R-BKA 08/2023 after multiple procedures and developed left foot pain for 3 weeks progressing to fever and chills 2-3 days PTA on 08/21/24. He was found to have gangrenous changes of most of his foot with malodor which was felt to be non-salvageable. He was started on IV Vanc/Zosyn  and underwent L-BKA on 11/04 by Dr. Sheree. Blood cultures done showed Group B strep and antibiotics changed to PCN with recommendations to rule out diskitis/OM of spine and TTE. He was unable to tolerate MRI (and refuses to get it done under anesthesia) and 2D echo done revealing EF 55-60% and mild thickening of AV/MV. Leucocytosis resolving with WBC down form 19.7-->10.9 and ABLA noted.  PT/OT consulted and patient requires CGA for ADLs and mobility. He was working PTA and CIR recommended due to functional decline. Patient transferred to CIR on 08/24/2024 .   Patient currently requires min assist with mobility secondary to muscle weakness, decreased  cardiorespiratoy endurance, and decreased sitting balance, decreased standing balance, and decreased balance strategies.  Prior to hospitalization, patient was independent  with mobility and lived with Spouse, Family, Daughter in a House home.  Home access is  Level entry.  Patient will benefit from skilled PT intervention to maximize safe functional mobility, minimize fall risk, and decrease caregiver burden for planned discharge home with 24 hour assist.  Anticipate patient will benefit from follow up OP at discharge.  PT - End of Session Activity Tolerance: Tolerates 30+ min activity with multiple rests Endurance Deficit: Yes PT Assessment Rehab Potential (ACUTE/IP ONLY): Excellent PT Barriers to Discharge: Inaccessible home environment;Decreased caregiver support;Home environment access/layout;Wound Care;Insurance for SNF coverage;Weight bearing restrictions PT Patient demonstrates impairments in the following area(s): Balance;Edema;Endurance;Motor;Pain;Safety;Sensory;Skin Integrity PT Transfers Functional Problem(s): Bed Mobility;Bed to Chair;Car;Furniture PT Locomotion Functional Problem(s): Wheelchair Mobility;Ambulation PT Plan PT Intensity: Minimum of 1-2 x/day ,45 to 90 minutes PT Frequency: 5 out of 7 days PT Duration Estimated Length of Stay: 7-10 days PT Treatment/Interventions: Ambulation/gait training;Cognitive remediation/compensation;Discharge planning;DME/adaptive equipment instruction;Functional mobility training;Pain management;Psychosocial support;Splinting/orthotics;Therapeutic Activities;UE/LE Strength taining/ROM;Balance/vestibular training;Community reintegration;Disease management/prevention;Neuromuscular re-education;Patient/family education;Skin care/wound management;Therapeutic Exercise;UE/LE Coordination activities;Wheelchair propulsion/positioning PT Transfers Anticipated Outcome(s): supervision/ Mod I PT Locomotion Anticipated Outcome(s): Mod I in wheelchair PT  Recommendation Recommendations for Other Services: Therapeutic Recreation consult Therapeutic Recreation Interventions: Pet therapy;Stress management;Outing/community reintergration Follow Up Recommendations: Outpatient PT;24 hour supervision/assistance Patient destination: Home Equipment Recommended: To be determined   PT Evaluation Precautions/Restrictions Precautions Precautions: Fall Required Braces or Orthoses: Other Brace Other Brace: L limb protector Restrictions Weight Bearing Restrictions Per Provider Order: Yes LLE Weight Bearing Per Provider Order: Non weight bearing  Pain Pain Assessment Pain Scale: 0-10 Pain Score: 6  Pain Type: Surgical pain Pain Location: Leg Pain Orientation: Left Pain Descriptors / Indicators: Aching;Discomfort Pain Onset: On-going Patients Stated Pain Goal: 1 Pain Intervention(s): Pain med given for lower pain score than stated, per patient request Pain Interference Pain Interference Pain Effect on Sleep: 2. Occasionally Pain Interference with Therapy Activities: 1. Rarely or not at all Pain Interference with Day-to-Day Activities: 2. Occasionally Home Living/Prior Functioning Home Living Available Help at Discharge: Family;Available 24 hours/day Type of Home: House Home Access: Level entry Home Layout: Two level;Bed/bath upstairs;1/2 bath on main level;Able to live on main level with bedroom/bathroom Alternate Level Stairs-Number of Steps: 16 Alternate Level Stairs-Rails: Right Bathroom Shower/Tub: Walk-in shower;Tub/shower unit Bathroom Toilet: Standard Bathroom Accessibility: Yes  Lives With: Spouse;Family;Daughter Prior Function Level of Independence: Independent with basic ADLs;Independent with homemaking with ambulation;Independent with gait;Independent with transfers  Able to Take Stairs?: Yes Driving: Yes Vocation: Full time employment 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[employer], expects to return to [employer] Vision/Perception  Vision - History Ability to See in Adequate Light: 0 Adequate Perception Perception: Within Functional Limits Praxis Praxis: WFL  Cognition Overall Cognitive Status: Within Functional Limits for tasks assessed Arousal/Alertness: Awake/alert Orientation Level: Oriented X4 Memory: Appears intact Awareness: Appears intact Problem Solving: Appears intact Safety/Judgment: Appears intact Sensation Sensation Light Touch: Appears Intact Coordination Gross Motor Movements are Fluid and Coordinated: Yes Fine Motor Movements are Fluid and Coordinated: No Coordination and Movement Description: LLE deficits d/t amputation surgery Motor  Motor Motor: Within Functional Limits Motor - Skilled Clinical Observations: mainly limited  by pain   Trunk/Postural Assessment  Cervical Assessment Cervical Assessment: Within Functional Limits Thoracic Assessment Thoracic Assessment: Within Functional Limits Lumbar Assessment Lumbar Assessment: Within Functional Limits Postural Control Postural Control: Within Functional Limits  Balance Balance Balance Assessed: Yes Static Sitting Balance Static Sitting - Balance Support: Left upper extremity supported;Right upper extremity supported Static Sitting - Level of Assistance: 5: Stand by assistance Dynamic Sitting Balance Dynamic Sitting - Balance Support: Left upper extremity supported;Right upper extremity supported Dynamic Sitting - Level of Assistance: Other (comment) (CGA) Dynamic Sitting - Balance Activities: Reaching for objects;Lateral lean/weight shifting Extremity Assessment      RLE Assessment RLE Assessment: Within Functional Limits LLE Assessment LLE Assessment: Exceptions to [facility] Strength Comments: hip 4+/5, knee 4-/5 d/t pain  Care Tool Care Tool Bed Mobility Roll left and right activity   Roll left and right assist level: Independent with assistive device    Sit to lying activity   Sit to  lying assist level: Supervision/Verbal cueing    Lying to sitting on side of bed activity   Lying to sitting on side of bed assist level: the ability to move from lying on the back to sitting on the side of the bed with no back support.: Supervision/Verbal cueing     Care Tool Transfers Sit to stand transfer Sit to stand activity did not occur: Safety/medical concerns (old R BKA, new L BKA)      Chair/bed transfer   Chair/bed transfer assist level: Minimal Assistance - Patient > 75%    Licensed Conveyancer transfer activity did not occur: Safety/medical concerns        Care Tool Locomotion Ambulation Ambulation activity did not occur: Safety/medical concerns (old R BKA, new L BKA)        Walk 10 feet activity Walk 10 feet activity did not occur: Safety/medical concerns (old R BKA, new L BKA)       Walk 50 feet with 2 turns activity Walk 50 feet with 2 turns activity did not occur: Safety/medical concerns (old R BKA, new L BKA)      Walk 150 feet activity Walk 150 feet activity did not occur: Safety/medical concerns (old R BKA, new L BKA)      Walk 10 feet on uneven surfaces activity Walk 10 feet on uneven surfaces activity did not occur: Safety/medical concerns (old R BKA, new L BKA)      Stairs Stair activity did not occur: Safety/medical concerns (old R BKA, new L BKA)        Walk up/down 1 step activity Walk up/down 1 step or curb (drop down) activity did not occur: Safety/medical concerns (old R BKA, new L BKA)      Walk up/down 4 steps activity Walk up/down 4 steps activity did not occur: Safety/medical concerns (old R BKA, new L BKA)      Walk up/down 12 steps activity Walk up/down 12 steps activity did not occur: Safety/medical concerns (old R BKA, new L BKA)      Pick up small objects from floor Pick up small object from the floor (from standing position) activity did not occur: Safety/medical concerns      Wheelchair Is the patient using a wheelchair?:  Yes Type of Wheelchair: Manual   Wheelchair assist level: Supervision/Verbal cueing Max wheelchair distance: 25 ft  Wheel 50 feet with 2 turns activity   Assist Level: Minimal Assistance - Patient > 75%  Wheel 150 feet activity   Assist Level: Maximal Assistance - Patient  25 - 49%    Refer to Care Plan for Long Term Goals  SHORT TERM GOAL WEEK 1 PT Short Term Goal 1 (Week 1): STG = LTG d/t ELOS  Recommendations for other services: Therapeutic Recreation  Pet therapy, Kitchen group, and Stress management  Skilled Therapeutic Intervention Mobility Bed Mobility Bed Mobility: Rolling Right;Rolling Left;Supine to Sit;Sit to Supine Rolling Right: Independent with assistive device Rolling Left: Independent with assistive device Supine to Sit: Contact Guard/Touching assist Sit to Supine: Supervision/Verbal cueing Transfers Transfers: Lateral/Scoot Transfers (with and without slide board) Lateral/Scoot Transfers: Contact Guard/Touching assist;Minimal Assistance - Patient > 75% Transfer (Assistive device): Other (Comment) (slide board) Locomotion  Gait Ambulation: No Gait Gait: No Stairs / Additional Locomotion Stairs: No Wheelchair Mobility Wheelchair Mobility: Yes Wheelchair Assistance: Doctor, General Practice: Both upper extremities Wheelchair Parts Management: Needs assistance Distance: 25 ft  Skilled Interventions: PT Evaluation completed; see above for results. PT educated patient in roles of PT vs OT, PT POC, rehab potential, rehab goals, and discharge recommendations along with recommendation for follow-up rehabilitation services. Individual treatment initiated:  Patient supine in bed upon PT arrival. Patient alert and agreeable to PT session.   Relates 6/10 pain complaint in LLE from surgical pain at start of session.  Pt very upset and relates frustration re: having penicillin  brought in for his treatment despite being allergic to penicillin .  Pt relates that someone told him that this allergy is no longer in chart. Opened pt's chart and informed pt that allergy is still listed in chart alongside allergy to scallops. However, penicillin  is listed as a low reaction. Alternate meds listed.   PA enters and discusses that pt has been receiving the same penicillin  medication since entering hospital last week and with no reactions, so she expects that pt should be able to continue treatment with penicillin . Pt demos understanding.   Pt has many questions re: how to progress without a LE to stand on d/t MD relating not wearing prosthetic leg to RLE until wound has healed on end of R residual limb. Also questions expected timeline of healing, how to walk on 2 prosthetic limbs, how long he will need AD, etc... Related to pt to take one step at a time as it nears and then to complete that step.   Pt able to rise to seated position on EOB with extra time and supervision for balance/ safety. Is able to maintain seated position with no back or LE support and against mild/ mod perturbation/ pressures demonstrating good trunk control and balance.   Lateral scoot into w/c form bed requires MinA/ CGA especially to LLE in order to maintain safety and balance to seat during transfer. VC only required for ensuring to maintain balance. Return to bed performed with slide board to provide easier and full seated support for transfer slightly uphill. Again vc provided re: positioning on board and maintaining balance throughout.   Patient supine in bed at end of session with brakes locked, bed alarm set, and all needs within reach.  Discharge Criteria: Patient will be discharged from PT if patient refuses treatment 3 consecutive times without medical reason, if treatment goals not met, if there is a change in medical status, if patient makes no progress towards goals or if patient is discharged from hospital.  The above assessment, treatment plan, treatment  alternatives and goals were discussed and mutually agreed upon: by patient  Mliss DELENA Milliner PT, DPT, CSRS 08/25/2024, 12:47 PM

## 2024-08-26 NOTE — Plan of Care (Signed)
  Problem: RH Balance Goal: LTG Patient will maintain dynamic sitting balance (PT) Description: LTG:  Patient will maintain dynamic sitting balance with assistance during mobility activities (PT) Flowsheets (Taken 08/25/2024 1655) LTG: Pt will maintain dynamic sitting balance during mobility activities with:: Independent with assistive device    Problem: RH Bed Mobility Goal: LTG Patient will perform bed mobility with assist (PT) Description: LTG: Patient will perform bed mobility with assistance, with/without cues (PT). Flowsheets (Taken 08/25/2024 1655) LTG: Pt will perform bed mobility with assistance level of: Independent with assistive device    Problem: RH Bed to Chair Transfers Goal: LTG Patient will perform bed/chair transfers w/assist (PT) Description: LTG: Patient will perform bed to chair transfers with assistance (PT). Flowsheets (Taken 08/25/2024 1655) LTG: Pt will perform Bed to Chair Transfers with assistance level: Supervision/Verbal cueing   Problem: RH Car Transfers Goal: LTG Patient will perform car transfers with assist (PT) Description: LTG: Patient will perform car transfers with assistance (PT). Flowsheets (Taken 08/25/2024 1655) LTG: Pt will perform car transfers with assist:: Supervision/Verbal cueing   Problem: RH Furniture Transfers Goal: LTG Patient will perform furniture transfers w/assist (OT/PT) Description: LTG: Patient will perform furniture transfers  with assistance (OT/PT). Flowsheets (Taken 08/25/2024 1655) LTG: Pt will perform furniture transfers with assist:: Supervision/Verbal cueing   Problem: RH Wheelchair Mobility Goal: LTG Patient will propel w/c in controlled environment (PT) Description: LTG: Patient will propel wheelchair in controlled environment, # of feet with assist (PT) Flowsheets (Taken 08/25/2024 1655) LTG: Pt will propel w/c in controlled environ  assist needed:: Independent with assistive device LTG: Propel w/c distance in  controlled environment: at least 150 ft Goal: LTG Patient will propel w/c in home environment (PT) Description: LTG: Patient will propel wheelchair in home environment, # of feet with assistance (PT). Flowsheets (Taken 08/25/2024 1655) LTG: Pt will propel w/c in home environ  assist needed:: Supervision/Verbal cueing Distance: wheelchair distance in controlled environment: 150 LTG: Propel w/c distance in home environment: up to 50 ft including all maneuvering

## 2024-08-26 NOTE — Progress Notes (Addendum)
 [facility]    Date of Admission:  08/24/2024   Total days of antibiotics 6 *day 4 of penicillin    ID: Sean Carter is a 53 y.o. male with  group b strep bacteremia  Principal Problem:   Below-knee amputation of left lower extremity ([name]) Active Problems:   S/P BKA (below knee amputation) unilateral, right ([name])    Subjective: Afebrile, worked with physical therapy today  Medications:   (feeding supplement) PROSource Plus  30 mL Oral BID BM   acarbose  25 mg Oral TID WC   amLODipine   10 mg Oral Daily   aspirin  EC  81 mg Oral Daily   atorvastatin   20 mg Oral Daily   B-complex with vitamin C  1 tablet Oral Daily   cholecalciferol  1,000 Units Oral Daily   enoxaparin  (LOVENOX ) injection  40 mg Subcutaneous Q24H   famotidine   40 mg Oral BID   insulin  aspart  0-5 Units Subcutaneous QHS   insulin  aspart  0-9 Units Subcutaneous TID WC   insulin  glargine-yfgn  30 Units Subcutaneous QHS   l-methylfolate-B6-B12  1 tablet Oral Daily   montelukast   10 mg Oral QHS   nutrition supplement (JUVEN)  1 packet Oral BID BM    Objective: Vital signs in last 24 hours: Temp:  [98.8 F (37.1 C)-99.1 F (37.3 C)] 98.9 F (37.2 C) (11/08 0550) Pulse Rate:  [62-72] 62 (11/08 0550) Resp:  [18] 18 (11/08 0550) BP: (128-146)/(66-75) 146/72 (11/08 0550) SpO2:  [96 %-97 %] 97 % (11/08 0550)  Physical Exam  Constitutional: He is oriented to person, place, and time. He appears well-developed and well-nourished. No distress.  HENT:  Mouth/Throat: Oropharynx is clear and moist. No oropharyngeal exudate.  Cardiovascular: Normal rate, regular rhythm and normal heart sounds. Exam reveals no gallop and no friction rub.  No murmur heard.  Pulmonary/Chest: Effort normal and breath sounds normal. No respiratory distress. He has no wheezes.  Abdominal: Soft. Bowel sounds are normal. He exhibits no distension. There is no tenderness.  Lymphadenopathy:  He has no cervical  adenopathy.  Neurological: He is alert and oriented to person, place, and time.  Ext: bilateral bka, left stump wrapped  Psychiatric: He has a normal mood and affect. His behavior is normal.    Lab Results Recent Labs    08/25/24 0523 08/26/24 0520  WBC 12.7* 11.8*  HGB 10.2* 10.8*  HCT 31.5* 32.5*  NA 133* 132*  K 4.2 3.9  CL 92* 93*  CO2 28 26  BUN 20 20  CREATININE 1.23 1.04   Liver Panel Recent Labs    08/25/24 0523 08/26/24 0520  PROT 6.5 6.8  ALBUMIN 1.9* 2.1*  AST 128* 115*  ALT 83* 89*  ALKPHOS 205* 207*  BILITOT 0.7 0.7    Microbiology: 11/5 blood cx ngtd Studies/Results: No results found.   Assessment/Plan:  53yo M with T2DM, hx of right BKA, and admitted for left DFU/foot ulcer with group b strep bacteremia. S/P Left BKA.  Currently on Iv penicillin  with plan to treat for 2 wks, end date of 09/06/2024.  He has history of low back pain and had incomplete MRI of thoracic spine. No evidence of discitis on incomplete study. Should he have worsening of back pain (above his baseline) Recommend to get CT of spine with contrast to evaluate for om/discitis, but have low suspicion for it.  Should he discharge from CIR before 11/19, you can switch him to amoxicillin  1gm  PO TID to finish out the course.  Leukocytosis = anticipate that it will continue to trend downward. Continue with cbc  Transaminitis = imaging consistent hepatic steatosis  We will see him back in the ID office for follow up. Will sign off.  evaluation of this patient requires complex antimicrobial therapy evaluation and counseling and isolation needs for disease transmission risk assessment and mitigation.   [facility] Pager: [phone]  08/26/2024, 12:28 PM

## 2024-08-27 DIAGNOSIS — S88112A Complete traumatic amputation at level between knee and ankle, left lower leg, initial encounter: Secondary | ICD-10-CM | POA: Diagnosis not present

## 2024-08-27 LAB — COMPREHENSIVE METABOLIC PANEL WITH GFR
ALT: 104 U/L — ABNORMAL HIGH (ref 0–44)
AST: 107 U/L — ABNORMAL HIGH (ref 15–41)
Albumin: 1.9 g/dL — ABNORMAL LOW (ref 3.5–5.0)
Alkaline Phosphatase: 222 U/L — ABNORMAL HIGH (ref 38–126)
Anion gap: 8 (ref 5–15)
BUN: 22 mg/dL — ABNORMAL HIGH (ref 6–20)
CO2: 27 mmol/L (ref 22–32)
Calcium: 8.3 mg/dL — ABNORMAL LOW (ref 8.9–10.3)
Chloride: 95 mmol/L — ABNORMAL LOW (ref 98–111)
Creatinine, Ser: 1.06 mg/dL (ref 0.61–1.24)
GFR, Estimated: >60 mL/min (ref >60)
Glucose, Bld: 289 mg/dL — ABNORMAL HIGH (ref 70–99)
Potassium: 4.9 mmol/L (ref 3.5–5.1)
Sodium: 130 mmol/L — ABNORMAL LOW (ref 135–145)
Total Bilirubin: 0.4 mg/dL (ref 0.0–1.2)
Total Protein: 6.1 g/dL — ABNORMAL LOW (ref 6.5–8.1)

## 2024-08-27 LAB — GLUCOSE, CAPILLARY
Glucose-Capillary: 243 mg/dL — ABNORMAL HIGH (ref 70–99)
Glucose-Capillary: 332 mg/dL — ABNORMAL HIGH (ref 70–99)
Glucose-Capillary: 290 mg/dL — ABNORMAL HIGH (ref 70–99)
Glucose-Capillary: 349 mg/dL — ABNORMAL HIGH (ref 70–99)

## 2024-08-27 MED ORDER — ACARBOSE 25 MG PO TABS
50.0000 mg | ORAL_TABLET | Freq: Three times a day (TID) | ORAL | Status: DC
  Administered 2024-08-27 – 2024-09-04 (×23): 50 mg via ORAL
  Filled 2024-08-27 (×24): qty 2

## 2024-08-27 MED ORDER — MENTHOL 3 MG MT LOZG
1.0000 | LOZENGE | OROMUCOSAL | Status: DC | PRN
  Administered 2024-08-27: 3 mg via ORAL
  Filled 2024-08-27: qty 9

## 2024-08-27 MED ORDER — VITAMIN D 25 MCG (1000 UNIT) PO TABS
2000.0000 [IU] | ORAL_TABLET | Freq: Every day | ORAL | Status: DC
  Administered 2024-08-28 – 2024-09-04 (×8): 2000 [IU] via ORAL
  Filled 2024-08-27 (×8): qty 2

## 2024-08-27 NOTE — Progress Notes (Signed)
 Pt continues to refuse CT scan when offered. Pt stated he afraid of small spaces. Provider notified.

## 2024-08-27 NOTE — Plan of Care (Signed)
  Problem: RH SAFETY Goal: RH STG ADHERE TO SAFETY PRECAUTIONS W/ASSISTANCE/DEVICE Description: STG Adhere to Safety Precautions With cues Assistance/Device. Outcome: Progressing   Problem: RH PAIN MANAGEMENT Goal: RH STG PAIN MANAGED AT OR BELOW PT'S PAIN GOAL Description: < 4 with prns Outcome: Progressing   Problem: RH KNOWLEDGE DEFICIT LIMB LOSS Goal: RH STG INCREASE KNOWLEDGE OF SELF CARE AFTER LIMB LOSS Description: Patient will be able to manage care at discharge using educational resources for medication and dietary modification independently Outcome: Progressing

## 2024-08-27 NOTE — IPOC Note (Signed)
 Overall Plan of Care [facility]) Patient Details Name: Sean Carter MRN: [id number] DOB: Jul 23, 1971  Admitting Diagnosis: Below-knee amputation of left lower extremity [facility])  Hospital Problems: Principal Problem:   Below-knee amputation of left lower extremity ([name]) Active Problems:   S/P BKA (below knee amputation) unilateral, right ([name])     Functional Problem List: Nursing Bowel, Endurance, Medication Management, Pain, Safety  PT Balance, Edema, Endurance, Motor, Pain, Safety, Sensory, Skin Integrity  OT Balance, Skin Integrity, Endurance, Safety, Pain  SLP    TR         Basic ADL's: OT Bathing, Dressing, Toileting     Advanced  ADL's: OT       Transfers: PT Bed Mobility, Bed to Chair, Car, Occupational Psychologist, Research Scientist (life Sciences): PT Psychologist, [employer], Ambulation     Additional Impairments: OT None  SLP        TR      Anticipated Outcomes Item Anticipated Outcome  Self Feeding Independent  Swallowing      Basic self-care  Mod-I  Toileting  Mod-I   Bathroom Transfers Mod-I  Bowel/Bladder  manage bowel w mod I assist  Transfers  supervision/ Mod I  Locomotion  Mod I in wheelchair  Communication     Cognition     Pain  Pain < 4 with prns  Safety/Judgment  manage safety w cues   Therapy Plan: PT Intensity: Minimum of 1-2 x/day ,45 to 90 minutes PT Frequency: 5 out of 7 days PT Duration Estimated Length of Stay: 7-10 days OT Intensity: Minimum of 1-2 x/day, 45 to 90 minutes OT Frequency: 5 out of 7 days OT Duration/Estimated Length of Stay: 7-10 days     Team Interventions: Nursing Interventions Patient/Family Education, Pain Management, Medication Management, Discharge Planning, Bowel Management, Disease Management/Prevention  PT interventions Ambulation/gait training, Cognitive remediation/compensation, Discharge planning, DME/adaptive equipment instruction, Functional mobility training, Pain management, Psychosocial support,  Splinting/orthotics, Therapeutic Activities, UE/LE Strength taining/ROM, Warden/ranger, Community reintegration, Disease management/prevention, Neuromuscular re-education, Patient/family education, Skin care/wound management, Therapeutic Exercise, UE/LE Coordination activities, Wheelchair propulsion/positioning  OT Interventions Warden/ranger, Discharge planning, Pain management, Functional electrical stimulation, Self Care/advanced ADL retraining, Therapeutic Activities, UE/LE Coordination activities, Disease mangement/prevention, Functional mobility training, Patient/family education, Skin care/wound managment, Therapeutic Exercise, DME/adaptive equipment instruction, Neuromuscular re-education, Psychosocial support, Splinting/orthotics, UE/LE Strength taining/ROM, Wheelchair propulsion/positioning, Community reintegration  SLP Interventions    TR Interventions    SW/CM Interventions     Barriers to Discharge MD  Medical stability  Nursing Decreased caregiver support, Home environment access/layout, IV antibiotics, Weight bearing restrictions 2 level 1/2 ba on main, has manual w/c with right BKA prosthesis. Main B+B up 17 steps in the home with S.O. assisting prn  PT Inaccessible home environment, Decreased caregiver support, Home environment access/layout, Wound Care, Insurance for SNF coverage, Weight bearing restrictions    OT Wound Care    SLP      SW       Team Discharge Planning: Destination: PT-Home ,OT- Home , SLP-  Projected Follow-up: PT-Outpatient PT, 24 hour supervision/assistance, OT-  Outpatient OT, SLP-  Projected Equipment Needs: PT-To be determined, OT- To be determined, SLP-  Equipment Details: PT- , OT-  Patient/family involved in discharge planning: PT- Patient,  OT-Patient, SLP-   MD ELOS: 7-10 days Medical Rehab Prognosis:  Excellent Assessment: The patient has been admitted for CIR therapies with the diagnosis of bilateral BKA. The team  will be addressing functional mobility, strength, stamina, balance, safety, adaptive  techniques and equipment, self-care, bowel and bladder mgt, patient and caregiver education. Goals have been set at modI. Anticipated discharge destination is home.        See Team Conference Notes for weekly updates to the plan of care

## 2024-08-27 NOTE — Progress Notes (Signed)
 PROGRESS NOTE   Subjective/Complaints: No new complaints this morning IPOC completed Patient's chart reviewed- No issues reported overnight Hypertensive  ROS: pain is well controlled, +constipation- improved   Objective:   No results found.  Recent Labs    08/25/24 0523 08/26/24 0520  WBC 12.7* 11.8*  HGB 10.2* 10.8*  HCT 31.5* 32.5*  PLT 424* 484*   Recent Labs    08/26/24 0520 08/27/24 0450  NA 132* 130*  K 3.9 4.9  CL 93* 95*  CO2 26 27  GLUCOSE 167* 289*  BUN 20 22*  CREATININE 1.04 1.06  CALCIUM  8.3* 8.3*    Intake/Output Summary (Last 24 hours) at 08/27/2024 1311 Last data filed at 08/27/2024 1123 Gross per 24 hour  Intake 546 ml  Output 1450 ml  Net -904 ml        Physical Exam: Vital Signs Blood pressure (!) 141/76, pulse 65, temperature 98.5 F (36.9 C), temperature source Oral, resp. rate 18, height 5' 8 (1.727 m), weight 93.1 kg, SpO2 96%. Gen: no distress, normal appearing HEENT: oral mucosa pink and moist, NCAT Cardio: Reg rate Chest: normal effort, normal rate of breathing Abdominal:     Palpations: Abdomen is soft.     Comments: Very protuberant; non distended; normoactive; Soft  Musculoskeletal:     Cervical back: Neck supple. No tenderness.     Comments: Ue's 5/5 throughout B/L RLE_ HF/KE/KF 5/5- has R BKA- with scabs on R BKA from standing 9-12 hours/day at work LLE- HF/KE/KF at least 4/5- bulbous L BKA, stable 11/9 Skin:    General: Skin is warm.     Comments: A couple of open and abraded areas on R-BKA. L-BKA with dry dressing.  Per last pic- has oozing from incision- dog ears (+) and bulbous L Forearm IV- not leaking anymore- constantly on IV Pen G  Neurological:     Mental Status: He is alert and oriented to person, place, and time.     Comments: Intact to light touch in all 4 extremities Ox3- keeps eyes closed due to fuzziness  Psychiatric:        Mood and Affect:  Mood normal.        Behavior: Behavior normal.    Assessment/Plan: 1. Functional deficits which require 3+ hours per day of interdisciplinary therapy in a comprehensive inpatient rehab setting. Physiatrist is providing close team supervision and 24 hour management of active medical problems listed below. Physiatrist and rehab team continue to assess barriers to discharge/monitor patient progress toward functional and medical goals  Care Tool:  Bathing    Body parts bathed by patient: Right arm, Left arm, Chest, Abdomen, Front perineal area, Buttocks, Right upper leg, Left upper leg, Face     Body parts n/a: Left lower leg, Right lower leg   Bathing assist Assist Level: Moderate Assistance - Patient 50 - 74%     Upper Body Dressing/Undressing Upper body dressing   What is the patient wearing?: Hospital gown only    Upper body assist Assist Level: Minimal Assistance - Patient > 75%    Lower Body Dressing/Undressing Lower body dressing      What is the patient wearing?: Ace wrap/stump  shrinker, Orthosis     Lower body assist Assist for lower body dressing: Moderate Assistance - Patient 50 - 74%     Toileting Toileting    Toileting assist Assist for toileting: Moderate Assistance - Patient 50 - 74%     Transfers Chair/bed transfer  Transfers assist     Chair/bed transfer assist level: Minimal Assistance - Patient > 75%     Locomotion Ambulation   Ambulation assist   Ambulation activity did not occur: Safety/medical concerns (old R BKA, new L BKA)          Walk 10 feet activity   Assist  Walk 10 feet activity did not occur: Safety/medical concerns (old R BKA, new L BKA)        Walk 50 feet activity   Assist Walk 50 feet with 2 turns activity did not occur: Safety/medical concerns (old R BKA, new L BKA)         Walk 150 feet activity   Assist Walk 150 feet activity did not occur: Safety/medical concerns (old R BKA, new L BKA)          Walk 10 feet on uneven surface  activity   Assist Walk 10 feet on uneven surfaces activity did not occur: Safety/medical concerns (old R BKA, new L BKA)         Wheelchair     Assist Is the patient using a wheelchair?: Yes Type of Wheelchair: Manual    Wheelchair assist level: Supervision/Verbal cueing Max wheelchair distance: 25 ft    Wheelchair 50 feet with 2 turns activity    Assist        Assist Level: Minimal Assistance - Patient > 75%   Wheelchair 150 feet activity     Assist      Assist Level: Maximal Assistance - Patient 25 - 49%   Blood pressure (!) 141/76, pulse 65, temperature 98.5 F (36.9 C), temperature source Oral, resp. rate 18, height 5' 8 (1.727 m), weight 93.1 kg, SpO2 96%.  Medical Problem List and Plan: 1. Functional deficits secondary to L BKA in setting of old R BKA due to osteomyelitis and gangrene             -patient may not shower             -ELOS/Goals: 7-10 days supervision to mod I at w/c level Chart and therapy notes reviewed, continue CIR, discussed patient's progress with therapy Grounds pass ordered Vitamin D3/Metanx/Vitamin B/C complex ordered F/u with me or Fidela in clinic within 1 month Would benefit from home health aide upon discharge   2.  Antithrombotics: -DVT/anticoagulation:  Pharmaceutical: Lovenox              -antiplatelet therapy: ASA 3. Back pain: CT spine ordered to assess for discitis. Hydrocodone--->changed to tramadol  prn.             --Ketorolac  X 5 days thru thru 11/08. Monitor renal status/BP stopped Norco due to LFT elevation 4. Mood/Behavior/Sleep: LCSW to follow for evaluation and support.              -antipsychotic agents: N/A 5. Neuropsych/cognition: This patient is capable of making decisions on his own behalf. 6. Skin/Wound Care: Routine pressure relief measures.  --Low carb protein supplements for wound healing.  7. Fluids/Electrolytes/Nutrition: Monitor I/O. Check CMET in am 8.  Group B strep bactermia: Antibiotics changed to  PCN G             --MRI TL spine limited  by claustrophobia.             --2 d echo OK  Continue IV Penicillin , can switch to amoxicillin  upon d/c as per ID  9. T2DM: Hgb A1c 6.5 and well controlled on Insulin  glargine 40 units w/20 units lispro bid.  --Will monitor BS ac/hs and use SSI for elevated BS             --Increase basal insulin  to 30 units. D/c feeding supplement. Acarbose added-increase to 50mg  TID  10.  HTN: Monitor BP TID--on amlodipine  10mg  daily, acarbose added as above for diabetes and can also help HTN  11. Abnormal LFTs: reviewed and continue to uptrend, d/c statin until they start to come down, repeat tomorrow.   12. ABLA: Hgb reviewed and was 10.8 on 11/8, trending upward.   13. Acute renal failure: Due to infection and has resolved.  Repeat creatinine tomorrow  LOS: 3 days A FACE TO FACE EVALUATION WAS PERFORMED  Sean Carter Aboud 08/27/2024, 1:11 PM

## 2024-08-28 ENCOUNTER — Inpatient Hospital Stay (HOSPITAL_COMMUNITY)

## 2024-08-28 DIAGNOSIS — E875 Hyperkalemia: Secondary | ICD-10-CM | POA: Diagnosis not present

## 2024-08-28 DIAGNOSIS — Z794 Long term (current) use of insulin: Secondary | ICD-10-CM

## 2024-08-28 DIAGNOSIS — S88112S Complete traumatic amputation at level between knee and ankle, left lower leg, sequela: Secondary | ICD-10-CM

## 2024-08-28 DIAGNOSIS — E1165 Type 2 diabetes mellitus with hyperglycemia: Secondary | ICD-10-CM

## 2024-08-28 DIAGNOSIS — R0989 Other specified symptoms and signs involving the circulatory and respiratory systems: Secondary | ICD-10-CM | POA: Diagnosis not present

## 2024-08-28 DIAGNOSIS — N179 Acute kidney failure, unspecified: Secondary | ICD-10-CM

## 2024-08-28 DIAGNOSIS — R059 Cough, unspecified: Secondary | ICD-10-CM | POA: Diagnosis not present

## 2024-08-28 DIAGNOSIS — R7989 Other specified abnormal findings of blood chemistry: Secondary | ICD-10-CM

## 2024-08-28 DIAGNOSIS — I10 Essential (primary) hypertension: Secondary | ICD-10-CM

## 2024-08-28 LAB — COMPREHENSIVE METABOLIC PANEL WITH GFR
ALT: 84 U/L — ABNORMAL HIGH (ref 0–44)
AST: 57 U/L — ABNORMAL HIGH (ref 15–41)
Albumin: 1.9 g/dL — ABNORMAL LOW (ref 3.5–5.0)
Alkaline Phosphatase: 186 U/L — ABNORMAL HIGH (ref 38–126)
Anion gap: 11 (ref 5–15)
BUN: 24 mg/dL — ABNORMAL HIGH (ref 6–20)
CO2: 26 mmol/L (ref 22–32)
Calcium: 8.4 mg/dL — ABNORMAL LOW (ref 8.9–10.3)
Chloride: 93 mmol/L — ABNORMAL LOW (ref 98–111)
Creatinine, Ser: 0.96 mg/dL (ref 0.61–1.24)
GFR, Estimated: >60 mL/min (ref >60)
Glucose, Bld: 337 mg/dL — ABNORMAL HIGH (ref 70–99)
Potassium: 5.2 mmol/L — ABNORMAL HIGH (ref 3.5–5.1)
Sodium: 130 mmol/L — ABNORMAL LOW (ref 135–145)
Total Bilirubin: 0.5 mg/dL (ref 0.0–1.2)
Total Protein: 6.3 g/dL — ABNORMAL LOW (ref 6.5–8.1)

## 2024-08-28 LAB — CULTURE, BLOOD (ROUTINE X 2)
Culture: NO GROWTH
Culture: NO GROWTH
Special Requests: ADEQUATE

## 2024-08-28 LAB — CBC
HCT: 29 % — ABNORMAL LOW (ref 39.0–52.0)
Hemoglobin: 9.3 g/dL — ABNORMAL LOW (ref 13.0–17.0)
MCH: 26.6 pg (ref 26.0–34.0)
MCHC: 32.1 g/dL (ref 30.0–36.0)
MCV: 83.1 fL (ref 80.0–100.0)
Platelets: 451 K/uL — ABNORMAL HIGH (ref 150–400)
RBC: 3.49 MIL/uL — ABNORMAL LOW (ref 4.22–5.81)
RDW: 13.5 % (ref 11.5–15.5)
WBC: 10.8 K/uL — ABNORMAL HIGH (ref 4.0–10.5)
nRBC: 0 % (ref 0.0–0.2)

## 2024-08-28 LAB — GLUCOSE, CAPILLARY
Glucose-Capillary: 337 mg/dL — ABNORMAL HIGH (ref 70–99)
Glucose-Capillary: 235 mg/dL — ABNORMAL HIGH (ref 70–99)
Glucose-Capillary: 352 mg/dL — ABNORMAL HIGH (ref 70–99)
Glucose-Capillary: 411 mg/dL — ABNORMAL HIGH (ref 70–99)
Glucose-Capillary: 357 mg/dL — ABNORMAL HIGH (ref 70–99)

## 2024-08-28 MED ORDER — INSULIN GLARGINE-YFGN 100 UNIT/ML ~~LOC~~ SOLN
38.0000 [IU] | Freq: Every day | SUBCUTANEOUS | Status: DC
  Administered 2024-08-28: 38 [IU] via SUBCUTANEOUS
  Filled 2024-08-28 (×2): qty 0.38

## 2024-08-28 MED ORDER — INSULIN GLARGINE-YFGN 100 UNIT/ML ~~LOC~~ SOLN
34.0000 [IU] | Freq: Every day | SUBCUTANEOUS | Status: DC
  Filled 2024-08-28: qty 0.34

## 2024-08-28 MED ORDER — SODIUM ZIRCONIUM CYCLOSILICATE 10 G PO PACK
10.0000 g | PACK | Freq: Once | ORAL | Status: AC
  Administered 2024-08-28: 10 g via ORAL
  Filled 2024-08-28: qty 1

## 2024-08-28 NOTE — Progress Notes (Signed)
 PROGRESS NOTE   Subjective/Complaints: CBGs have been a little elevated.  Nursing note indicates he refused CT scan yesterday due to fear of small spaces.  ROS: Denies CP, SOB, abdominal pain, nausea, vomiting  pain is well controlled, +constipation- improved   Objective:   No results found.  Recent Labs    08/26/24 0520 08/28/24 0515  WBC 11.8* 10.8*  HGB 10.8* 9.3*  HCT 32.5* 29.0*  PLT 484* 451*   Recent Labs    08/27/24 0450 08/28/24 0515  NA 130* 130*  K 4.9 5.2*  CL 95* 93*  CO2 27 26  GLUCOSE 289* 337*  BUN 22* 24*  CREATININE 1.06 0.96  CALCIUM  8.3* 8.4*    Intake/Output Summary (Last 24 hours) at 08/28/2024 1618 Last data filed at 08/28/2024 1340 Gross per 24 hour  Intake 660 ml  Output 500 ml  Net 160 ml        Physical Exam: Vital Signs Blood pressure 126/70, pulse 64, temperature 98.4 F (36.9 C), temperature source Oral, resp. rate 18, height 5' 8 (1.727 m), weight 93.1 kg, SpO2 95%.   Gen: no distress, normal appearing HEENT: oral mucosa pink and moist, NCAT Cardio: RRR Chest: CTAB, normal effort, normal rate of breathing Abdominal:     Palpations: Abdomen is soft.     Comments: Very protuberant; non distended; normoactive; Soft  Musculoskeletal:     Cervical back: Neck supple. No tenderness.     Comments: Ue's 5/5 throughout B/L RLE_ HF/KE/KF 5/5- has R BKA- with scabs on R BKA from standing 9-12 hours/day at work LLE- HF/KE/KF at least 4/5- bulbous L BKA, stable 11/9 Skin:    General: Skin is warm.     Comments: A couple of open and abraded areas on R-BKA. L-BKA with dry dressing.  Per last pic- has oozing from incision- dog ears (+) and bulbous L Forearm IV- not leaking anymore- constantly on IV Pen G  Neurological:     Mental Status: He is alert and oriented to person, place, and time.     Comments: Intact to light touch in all 4 extremities Ox3- keeps eyes closed due  to fuzziness  Psychiatric:        Mood and Affect: Mood normal.        Behavior: Behavior normal.    Assessment/Plan: 1. Functional deficits which require 3+ hours per day of interdisciplinary therapy in a comprehensive inpatient rehab setting. Physiatrist is providing close team supervision and 24 hour management of active medical problems listed below. Physiatrist and rehab team continue to assess barriers to discharge/monitor patient progress toward functional and medical goals  Care Tool:  Bathing    Body parts bathed by patient: Right arm, Left arm, Chest, Abdomen, Front perineal area, Buttocks, Right upper leg, Left upper leg, Face     Body parts n/a: Left lower leg, Right lower leg   Bathing assist Assist Level: Moderate Assistance - Patient 50 - 74%     Upper Body Dressing/Undressing Upper body dressing   What is the patient wearing?: Hospital gown only    Upper body assist Assist Level: Minimal Assistance - Patient > 75%    Lower Body Dressing/Undressing  Lower body dressing      What is the patient wearing?: Ace wrap/stump shrinker, Orthosis     Lower body assist Assist for lower body dressing: Moderate Assistance - Patient 50 - 74%     Toileting Toileting    Toileting assist Assist for toileting: Moderate Assistance - Patient 50 - 74%     Transfers Chair/bed transfer  Transfers assist     Chair/bed transfer assist level: Supervision/Verbal cueing     Locomotion Ambulation   Ambulation assist   Ambulation activity did not occur: Safety/medical concerns (old R BKA, new L BKA)          Walk 10 feet activity   Assist  Walk 10 feet activity did not occur: Safety/medical concerns (old R BKA, new L BKA)        Walk 50 feet activity   Assist Walk 50 feet with 2 turns activity did not occur: Safety/medical concerns (old R BKA, new L BKA)         Walk 150 feet activity   Assist Walk 150 feet activity did not occur: Safety/medical  concerns (old R BKA, new L BKA)         Walk 10 feet on uneven surface  activity   Assist Walk 10 feet on uneven surfaces activity did not occur: Safety/medical concerns (old R BKA, new L BKA)         Wheelchair     Assist Is the patient using a wheelchair?: Yes Type of Wheelchair: Manual    Wheelchair assist level: Independent Max wheelchair distance: 150'    Wheelchair 50 feet with 2 turns activity    Assist        Assist Level: Independent   Wheelchair 150 feet activity     Assist      Assist Level: Independent   Blood pressure 126/70, pulse 64, temperature 98.4 F (36.9 C), temperature source Oral, resp. rate 18, height 5' 8 (1.727 m), weight 93.1 kg, SpO2 95%.  Medical Problem List and Plan: 1. Functional deficits secondary to L BKA in setting of old R BKA due to osteomyelitis and gangrene             -patient may not shower             -ELOS/Goals: 7-10 days supervision to mod I at w/c level Chart and therapy notes reviewed, continue CIR, discussed patient's progress with therapy Grounds pass ordered Vitamin D3/Metanx/Vitamin B/C complex ordered F/u with me or Fidela in clinic within 1 month Would benefit from home health aide upon discharge  Continue CIR PT, OT  2.  Antithrombotics: -DVT/anticoagulation:  Pharmaceutical: Lovenox              -antiplatelet therapy: ASA 3. Back pain: CT spine ordered to assess for discitis. Hydrocodone--->changed to tramadol  prn.             --Ketorolac  X 5 days thru thru 11/08. Monitor renal status/BP stopped Norco due to LFT elevation 4. Mood/Behavior/Sleep: LCSW to follow for evaluation and support.              -antipsychotic agents: N/A 5. Neuropsych/cognition: This patient is capable of making decisions on his own behalf. 6. Skin/Wound Care: Routine pressure relief measures.  --Low carb protein supplements for wound healing.  7. Fluids/Electrolytes/Nutrition: Monitor I/O. Check CMET in am 8. Group  B strep bactermia: Antibiotics changed to  PCN G             --MRI TL spine  limited by claustrophobia.             --2 d echo OK  Continue IV Penicillin , can switch to amoxicillin  upon d/c as per ID  9. T2DM: Hgb A1c 6.5 and well controlled on Insulin  glargine 40 units w/20 units lispro bid.  --Will monitor BS ac/hs and use SSI for elevated BS             --Increase basal insulin  to 30 units. D/c feeding supplement. Acarbose added-increase to 50mg  TID  - 11/10 increase Semglee  to 34 units at bedtime for hyperglycemia  CBG (last 3)  Recent Labs    08/27/24 2044 08/28/24 0619 08/28/24 1139  GLUCAP 349* 337* 235*     10.  HTN: Monitor BP TID--on amlodipine  10mg  daily, acarbose added as above for diabetes and can also help HTN  - 11/10 intermittently elevated but overall controlled, continue current regimen and monitor      08/28/2024    2:03 PM 08/28/2024    5:39 AM 08/27/2024    6:27 PM  Vitals with BMI  Systolic 126 154 887  Diastolic 70 75 65  Pulse 64 58 59     11. Abnormal LFTs: reviewed and continue to uptrend, d/c statin until they start to come down, repeat tomorrow.   - 11/10 alk phos, AST and ALT improved today, continue current regimen  12. ABLA: Hgb reviewed and was 10.8 on 11/8, trending upward.   - 11/10 hemoglobin a little lower at 9.3, continue to monitor  13. Acute renal failure: Due to infection and has resolved.  Repeat creatinine tomorrow  - 11/10 stable, creatinine a little bit improved to 0.96 BUN a little higher at  24 14.  Hyponatremia/hyperkalemia  - 11/10 Lokelma  today recheck tomorrow, hyponatremia appears chronic   LOS: 4 days A FACE TO FACE EVALUATION WAS PERFORMED  Murray Collier 08/28/2024, 4:18 PM

## 2024-08-28 NOTE — Progress Notes (Signed)
 Pt CBG 411, Oncall PA Pamela called. Verbal order to modify SEMGLEE  to 38Units and give 10units of Novolog  and recheck cbg at 11pm. Pt asymptomatic, in bed, call bell in reach.

## 2024-08-28 NOTE — Progress Notes (Signed)
 Physical Therapy Session Note  Patient Details  Name: Sean Carter MRN: [id number] Date of Birth: [birth year]  Today's Date: 08/28/2024 Sean Carter Individual Time: 1030-1126 Sean Carter Individual Time Calculation (min): 56 min   Short Term Goals: Week 1:  Sean Carter Short Term Goal 1 (Week 1): STG = LTG d/t ELOS  Skilled Therapeutic Interventions/Progress Updates:    Sean Carter presents in bed, agreeable to session. Discussed his overall goals and limitations right now with the restrictions with use of RLE prosthesis. Discussed option to trial A/P transfer technique to eliminate need for use of SB as well as decrease fall risk with set up and positioning. Sean Carter agreeable and with cues for problem solving, technique and hand placement completed with close S/CGA to stabilize w/c. Educated on w/c parts management with Sean Carter return demonstrating leg rest management. Sean Carter performed transfer again onto mat table with focus on him setting up w/c with cues for technique and hand placement (Sean Carter demonstrated most efficient technique with UE to lift bottom) and educated on importance of increasing core and UE strengthening to improve overall mobility now and in the future for prosthetic use. Sean Carter in agreement. Bed mobility with flat surface requires cues for efficiency and extra time to complete. Supine hip flexor stretching on edge of bed with RLE and then LLE dangling x 30 sec-1 min hold x 3 reps to stretch. Sean Carter reports unable to tolerate on stomach. Educated on importance of hip flexor stretching and spending time in neutral (sidelying or supine) - Sean Carter in agreement. Seated core strengthening exercises to aid with the above mentioned with 4# weighted ball x diagonal chops each direction x 10 reps each side. Returned back to w/c and propelled back to room S due to IV pole management but mod I for propulsion. Transferred back to bed with anterior technique and CGA to stabilize w/c but S for transfer and repositioned in the bed. Handoff to CSW and wife  present in room.   Therapy Documentation Precautions:  Precautions Precautions: Fall Recall of Precautions/Restrictions: Intact Precaution/Restrictions Comments: healing wound on distal end of residual limb Required Braces or Orthoses: Other Brace Other Brace: L limb protector Restrictions Weight Bearing Restrictions Per Provider Order: Yes LLE Weight Bearing Per Provider Order: Non weight bearing  Pain: C/o 4/10 pain in residual limb L, also reporting pain in IV site on LUE - RN made aware. Premedicated for residual limb pain per report.     Therapy/Group: Individual Therapy  Sean Carter, Sean Carter, Sean Carter, Sean Carter  08/28/2024, 11:38 AM

## 2024-08-28 NOTE — Progress Notes (Signed)
 Inpatient Rehabilitation Care Coordinator Assessment and Plan Patient Details  Name: Sean Carter MRN: [id number] Date of Birth: 1971-07-17  Today's Date: 08/28/2024  Hospital Problems: Principal Problem:   Below-knee amputation of left lower extremity ([name]) Active Problems:   S/P BKA (below knee amputation) unilateral, right [facility])  Past Medical History:  Past Medical History:  Diagnosis Date   Blurry vision 07/10/2020   Diabetes mellitus without complication ([name])    Gangrene of toe of left foot ([name])    Peripheral arterial disease    Sepsis due to Streptococcus, group B ([name]) 06/20/2020   Streptococcal bacteremia    Past Surgical History:  Past Surgical History:  Procedure Laterality Date   ABDOMINAL AORTOGRAM W/LOWER EXTREMITY N/A 01/18/2023   Procedure: ABDOMINAL AORTOGRAM W/LOWER EXTREMITY;  Surgeon: Eliza Lonni RAMAN, MD;  Location: [facility] INVASIVE CV LAB;  Service: Cardiovascular;  Laterality: N/A;   AMPUTATION Left 05/31/2020   Procedure: Ray Amputation of Left Great Toe with Negative Pressure Vac Placement;  Surgeon: Gretta Lonni PARAS, MD;  Location: [facility] OR;  Service: Vascular;  Laterality: Left;   AMPUTATION Right 07/22/2022   Procedure: RIGHT FIRST AND SECOND RAY PARTIAL  AMPUTATION;  Surgeon: Magda Debby SAILOR, MD;  Location: [facility] OR;  Service: Vascular;  Laterality: Right;   AMPUTATION Right 09/02/2023   Procedure: AMPUTATION BELOW KNEE;  Surgeon: Serene Gaile ORN, MD;  Location: [facility] OR;  Service: Vascular;  Laterality: Right;   AMPUTATION Left 08/22/2024   Procedure: LEFT AMPUTATION BELOW KNEE;  Surgeon: Sheree Penne Lonni, MD;  Location: [facility] OR;  Service: Vascular;  Laterality: Left;   APPLICATION OF WOUND VAC Right 01/22/2023   Procedure: APPLICATION OF WOUND VAC, RIGHT FOOT;  Surgeon: Sheree Penne Lonni, MD;  Location: [facility] OR;  Service: Vascular;  Laterality: Right;   INCISION AND DRAINAGE OF WOUND Right 01/22/2023   Procedure: WASHOUT OF RIGHT FOOT WOUND;   Surgeon: Sheree Penne Lonni, MD;  Location: [facility] OR;  Service: Vascular;  Laterality: Right;   PERIPHERAL VASCULAR BALLOON ANGIOPLASTY  01/18/2023   Procedure: PERIPHERAL VASCULAR BALLOON ANGIOPLASTY;  Surgeon: Eliza Lonni RAMAN, MD;  Location: [facility] INVASIVE CV LAB;  Service: Cardiovascular;;   STUMP REVISION Right 01/22/2023   Procedure: PARTIAL CLOSURE OF RIGHT FOOT WOUND;  Surgeon: Sheree Penne Lonni, MD;  Location: [facility] OR;  Service: Vascular;  Laterality: Right;   TRANSMETATARSAL AMPUTATION Right 01/19/2023   Procedure: TRANSMETATARSAL AMPUTATION;  Surgeon: Sheree Penne Lonni, MD;  Location: [facility] OR;  Service: Vascular;  Laterality: Right;   Social History:  reports that he has never smoked. He has never been exposed to tobacco smoke. He has never used smokeless tobacco. He reports that he does not use drugs. No history on file for alcohol  use.  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, Investment Banker, Operational) Patient Roles: Partner Spouse/Significant Other: Sean Carter [phone] Children: 2 children Anticipated Caregiver: S/o Sean Ability/Limitations of Caregiver: Sean works FT Caregiver Availability: Intermittent Family Dynamics: Some support  Social History Preferred language: English Religion:  Education: Charity Fundraiser - How often do you need to have someone help you when you read instructions, pamphlets, or other written material from your doctor or pharmacy?: Never Writes: Yes Employment Status: Disabled   Abuse/Neglect Abuse/Neglect Assessment Can Be Completed: Yes Physical Abuse: Denies Verbal Abuse: Denies Sexual Abuse: Denies Exploitation of patient/patient's resources: Denies Self-Neglect: Denies  Patient response to: Social Isolation - How often do you feel lonely or isolated from those around you?: Never  Emotional Status Pt's  affect, behavior and adjustment status: Adjusting well to therapy Recent Psychosocial Issues:  None Psychiatric History: None Substance Abuse History: None  Patient / Family Perceptions, Expectations & Goals Pt/Family understanding of illness & functional limitations: Patient/family understanding of illness & functional limitations Premorbid pt/family roles/activities: Active in the community Anticipated changes in roles/activities/participation: Adding on another prostethic 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, Trade Union Agencies: None Premorbid Home Care/DME Agencies: Other (Comment) (Left prostethic - patient working on another obtaining another prosthetic once the staples come out) Transportation available at discharge: Yes Is the patient able to respond to transportation needs?: Yes In the past 12 months, has lack of transportation kept you from medical appointments or from getting medications?: No In the past 12 months, has lack of transportation kept you from meetings, work, or from getting things needed for daily living?: No  Discharge Planning Living Arrangements: Spouse/significant other Support Systems: Spouse/significant other, Children Type of Residence: Private residence Insurance Resources: Media Planner (specify) (AETNA / AETNA [facility] QHP) Financial Resources: SSD Financial Screen Referred: No Living Expenses: Psychologist, Sport And Exercise Management: Patient, Significant Other Does the patient have any problems obtaining your medications?: No Home Management: Patient manages home Patient/Family Preliminary Plans: Plans to retrun home Care Coordinator Anticipated Follow Up Needs: HH/OP Expected length of stay: 7-10 days  Clinical Impression CSW met with patient/family to introduce herself and complete initial assessment. Patient is able to make needs known. He lives with his s/o Sean and they share bills. He has DME such as a walker and a prosthetic leg - he has it here at the hospital. He plans on getting his second  prosthetic as soon as possible. He is not retired but, he is disabled due to the amputation. The plan is to return back home upon discharge. There were no further needs or concerns at present. CSW will follow up with family and continue to follow.   Will provide patient/family with an update as soon as one becomes available.   [name]  Loreli 08/28/2024, 12:17 PM

## 2024-08-28 NOTE — Plan of Care (Signed)
  Problem: RH BOWEL ELIMINATION Goal: RH STG MANAGE BOWEL WITH ASSISTANCE Description: STG Manage Bowel with mod I Assistance. Outcome: Progressing   Problem: RH SAFETY Goal: RH STG ADHERE TO SAFETY PRECAUTIONS W/ASSISTANCE/DEVICE Description: STG Adhere to Safety Precautions With cues Assistance/Device. Outcome: Progressing

## 2024-08-28 NOTE — Progress Notes (Signed)
 Occupational Therapy Session Note  Patient Details  Name: Sean Carter MRN: [id number] Date of Birth: August 12, 1971  Today's Date: 08/28/2024 OT Individual Time: 1300-1330 OT Individual Time Calculation (min): 30 min    Short Term Goals: Week 1:  OT Short Term Goal 1 (Week 1): STG=LTG d/t ELOS  Skilled Therapeutic Interventions/Progress Updates:      Therapy Documentation Precautions:  Precautions Precautions: Fall Recall of Precautions/Restrictions: Intact Precaution/Restrictions Comments: healing wound on distal end of residual limb Required Braces or Orthoses: Other Brace Other Brace: L limb protector Restrictions Weight Bearing Restrictions Per Provider Order: Yes LLE Weight Bearing Per Provider Order: Non weight bearing General:  Pt supine in bed upon OT arrival, agreeable to OT session.  Pain: 5/10 pain reported in Lt residual limb, positioning and distractions provided for pain management, pt reports tolerable to proceed.   Other Treatments: OT providing therapeutic use of self in order to build rapport and discuss patient current situation and goals for therapy. Pt reporting active at work before second amputation and is pt's main goal is to get back to work. Pt inquiring about therapies after D/C. OT educating about therapy options after D/C. OT educating pt on importance of skin checks to prevent infection. OT educating pt on shrinker care. OT educating pt on options for hand controls once pt cleared for driving d/t pt interest in driving. Pt appreciative of education.   Pt supine in bed with bed alarm activated, 2 bed rails up, call light within reach and 4Ps assessed.  Therapy/Group: Individual Therapy  Camie Hoe, OTD, OTR/L 08/28/2024, 7:55 PM

## 2024-08-28 NOTE — Progress Notes (Signed)
 Occupational Therapy Session Note  Patient Details  Name: Sean Carter MRN: [id number] Date of Birth: 1971/09/05  Today's Date: 08/28/2024 OT Individual Time: 0905-1000 OT Individual Time Calculation (min): 55 min   Today's Date: 08/28/2024 OT Individual Time: 8561-8467 OT Individual Time Calculation (min): 54 min   Short Term Goals: Week 1:  OT Short Term Goal 1 (Week 1): STG=LTG d/t ELOS  Skilled Therapeutic Interventions/Progress Updates:   Session 1: Pt greeted sitting in WC, skilled OT session with focus on residual limb and POC education. Upon OT arrival, patient has prothestic donned and reports having attempted stand-pivot from WC<>BSC/toilet with nursing. Patient relays disappointment with performance, expressing . . . I guess that's what a week of being in the hospital does to you. . .. Time dedicated to assuring current medical POC with PA Sharlet in terms on RLE/prosthesis to provide improved education on OT POC. Pt made aware that transfer methods are limited to use of slide-board/lateral scoots with potential for progressing towards squat-pivots/stand-pivots based on healing of RLE wounds. Pt performs slide-board transfer from WC>EOB with OT re-educating on board orientation/placement. Pt remained resting in bed with all immediate needs met.   Session 2:  Pt greeted resting in bed for skilled OT session with focus on residual limb care, functional transfers, and discharge planning. Pt educated on need to provide cleansing to L residual limb and change of shinker daily. OT provides dependent care as model/demonstration. 4XL shinker donned at Max A level. OT washes other shinker. Time dedicated to retriving bariatric drop-arm BSC for improved safety with toilet transfers. Pt performs AP transfer from EOB<>DABSC with Min A upon return to bed. Discussed at length first floor bathroom layout and options for DME including DABSC, 3-in-1 TTB/BSC, and standard BSC. Pt with hesitancy in  terms of larger DME as he feels spouse . . .will not want that thing in there.   Pt encouraged to inquire about measurements and/or pictures for more effective discharge planning, issued home measurement sheet. Pt uses urinal with setup A. PA/RN made aware of persistent dry cough during both sessions. Pt remained resting in bed with all immediate needs met.    Therapy Documentation Precautions:  Precautions Precautions: Fall Recall of Precautions/Restrictions: Intact Precaution/Restrictions Comments: healing wound on distal end of residual limb Required Braces or Orthoses: Other Brace Other Brace: L limb protector Restrictions Weight Bearing Restrictions Per Provider Order: Yes LLE Weight Bearing Per Provider Order: Non weight bearing   Therapy/Group: Individual Therapy  Nereida Habermann, OTR/L, MSOT  08/28/2024, 3:42 PM

## 2024-08-29 ENCOUNTER — Other Ambulatory Visit (HOSPITAL_COMMUNITY): Payer: Self-pay

## 2024-08-29 ENCOUNTER — Telehealth (HOSPITAL_COMMUNITY): Payer: Self-pay

## 2024-08-29 DIAGNOSIS — S88112A Complete traumatic amputation at level between knee and ankle, left lower leg, initial encounter: Secondary | ICD-10-CM | POA: Diagnosis not present

## 2024-08-29 DIAGNOSIS — F54 Psychological and behavioral factors associated with disorders or diseases classified elsewhere: Secondary | ICD-10-CM

## 2024-08-29 DIAGNOSIS — S88112S Complete traumatic amputation at level between knee and ankle, left lower leg, sequela: Secondary | ICD-10-CM | POA: Diagnosis not present

## 2024-08-29 LAB — BASIC METABOLIC PANEL WITH GFR
Anion gap: 12 (ref 5–15)
BUN: 22 mg/dL — ABNORMAL HIGH (ref 6–20)
CO2: 23 mmol/L (ref 22–32)
Calcium: 8.6 mg/dL — ABNORMAL LOW (ref 8.9–10.3)
Chloride: 97 mmol/L — ABNORMAL LOW (ref 98–111)
Creatinine, Ser: 0.94 mg/dL (ref 0.61–1.24)
GFR, Estimated: >60 mL/min (ref >60)
Glucose, Bld: 268 mg/dL — ABNORMAL HIGH (ref 70–99)
Potassium: 5.2 mmol/L — ABNORMAL HIGH (ref 3.5–5.1)
Sodium: 132 mmol/L — ABNORMAL LOW (ref 135–145)

## 2024-08-29 LAB — GLUCOSE, CAPILLARY
Glucose-Capillary: 261 mg/dL — ABNORMAL HIGH (ref 70–99)
Glucose-Capillary: 241 mg/dL — ABNORMAL HIGH (ref 70–99)
Glucose-Capillary: 262 mg/dL — ABNORMAL HIGH (ref 70–99)
Glucose-Capillary: 307 mg/dL — ABNORMAL HIGH (ref 70–99)

## 2024-08-29 MED ORDER — INSULIN ASPART 100 UNIT/ML IJ SOLN
5.0000 [IU] | Freq: Three times a day (TID) | INTRAMUSCULAR | Status: DC
  Administered 2024-08-29 – 2024-08-30 (×3): 5 [IU] via SUBCUTANEOUS
  Filled 2024-08-29 (×3): qty 5

## 2024-08-29 MED ORDER — INSULIN GLARGINE-YFGN 100 UNIT/ML ~~LOC~~ SOLN
40.0000 [IU] | Freq: Every day | SUBCUTANEOUS | Status: DC
  Administered 2024-08-29 – 2024-09-03 (×6): 40 [IU] via SUBCUTANEOUS
  Filled 2024-08-29 (×8): qty 0.4

## 2024-08-29 NOTE — Telephone Encounter (Signed)
 Pharmacy Patient Advocate Encounter  Insurance verification completed.    The patient is insured through [facility]. Patient has [employer], may use a copay card, and/or apply for patient assistance if available.    Ran test claim for Basaglar  Kwikpen 100unit and the current 30 day co-pay is $315.36.  Ran test claim for Novolog  100 unit Flexpen and the current 30 day co-pay is $135.06.   This test claim was processed through [facility]- copay amounts may vary at other pharmacies due to pharmacy/plan contracts, or as the patient moves through the different stages of their insurance plan.

## 2024-08-29 NOTE — Progress Notes (Signed)
 Occupational Therapy Session Note  Patient Details  Name: Sean Carter MRN: [id number] Date of Birth: 04/12/1971  Today's Date: 08/29/2024 OT Individual Time: 9092-9062 OT Individual Time Calculation (min): 30 min    Short Term Goals: Week 1:  OT Short Term Goal 1 (Week 1): STG=LTG d/t ELOS  Skilled Therapeutic Interventions/Progress Updates:      Therapy Documentation Precautions:  Precautions Precautions: Fall Recall of Precautions/Restrictions: Intact Precaution/Restrictions Comments: healing wound on distal end of residual limb Required Braces or Orthoses: Other Brace Other Brace: L limb protector Restrictions Weight Bearing Restrictions Per Provider Order: Yes LLE Weight Bearing Per Provider Order: Non weight bearing General:  Pt seated in W/C upon OT arrival, agreeable to OT.  Pain: no pain reported  Other Treatments: Session focus on pt advocacy, therapeutic use of self and therapeutic reasoning/listening. Pt confiding in OT about labs and increased sugar levels overnight making pt feel sick. Pt very frustrated about medication changes making him feel worse and wondering why it has changed when he has been on same medication regimen for many years with good results.  Pt also reports eating healthy and blood sugar well maintained outside of hospital. OT providing therapeutic listening and empathetic discussion for pt's frustrations. OT re-iterating importance that we do try have pt's best interests in mind when making up plans for therapies in order to increase independence and work with collaboration with pt to maximize results and independence. Pt very appreciative of therapeutic listening, education and advice from OT. Pt in better spirits upon session close.    Pt seated in W/C at end of session with call light within reach and 4Ps assessed.    Therapy/Group: Individual Therapy  Camie Hoe, OTD, OTR/L 08/29/2024, 9:40 AM

## 2024-08-29 NOTE — Consult Note (Signed)
 Neuropsychological Consultation Comprehensive Inpatient Rehab   Patient:   Sean Carter   DOB:   [birth year]  MR Number:  [id number]  [facility] [address] [place] [address] Dept: [phone] Loc: [phone]           Date of Service:   08/29/2024  Start Time:   1 PM End Time:   2 PM  Provider/Observer:  Norleen Asa, Psy.D.       Clinical Neuropsychologist       Billing Code/Service: [phone]  Reason for Consultation: Sean Carter is a 53 year old male referred for neuropsychological consultation during ongoing admission to the comprehensive inpatient rehabilitation (CIR) following a left below-knee amputation (BKA). Patient has a history of a prior right BKA. Consultation is to assess cognitive and emotional adjustment to his current medical condition and functional decline.  History of Present Illness: Patient is a 53 year old male with a history of type 2 diabetes mellitus (T2DM), diabetic retinopathy, and peripheral artery disease (PAD). He underwent a right BKA in November 2024. He presented on 08/21/2024 with left foot pain, fever, and chills, leading to a diagnosis of a non-salvageable gangrenous foot. He underwent a left BKA on 08/22/2024 performed by Dr. Sheree. Blood cultures were positive for Group B streptococcus. Antibiotic regimen was changed from IV Vancomycin /Zosyn  to Penicillin . Recommendations were made to rule out discitis/osteomyelitis and for a transthoracic echocardiogram (TTE). Patient was unable to tolerate MRI. TTE showed an ejection fraction of 55-60% with mild thickening of the aortic and mitral valves. Leukocytosis is resolving. PT/OT have been consulted, and he requires close-guarding assist for activities of daily living (ADLs) and mobility. Admission to CIR was recommended due to functional decline.  The patient reports a chronic cough since an intubation in 2021.  He notes it is more frequent recently. A chest X-ray was performed last night, but he is unaware of the results.  The patient expresses frustration with his initial post-operative insulin  management on a sliding scale, which resulted in significant hyperglycemia (e.g., a reading of 412 mg/dL), a level he has not experienced since 2000. His home regimen was 40 units of long-acting insulin  in the morning and 20 units per meal, which maintained his A1C at 6.5. He reports his regimen has been changed back to his typical dosing.    Past Medical History: Type 2 Diabetes Mellitus with diabetic retinopathy (A1C 6.5) Peripheral Artery Disease Right below-knee amputation (08/29/2023) History of intubation (2021)  Psychosocial Factors: Patient is a investment banker, operational by profession. He reports significant motivation to recover and return to work, similar to his experience after his first amputation. Following his R-BKA, he was walking with a prosthesis within 5.5 weeks. He describes a negative experience with his discharge after the first amputation, feeling he was sent home prematurely by a resident physician. He is using that experience as motivation for his current recovery.  Behavioral Observations/Mental Status: Patient was alert, oriented, and cooperative throughout the session. Mood appeared euthymic. Affect was appropriate to the content of the discussion. He engaged well, demonstrating insight into his medical situation and a strong motivation for rehabilitation. No overt signs of cognitive impairment were noted during clinical interview.  Impression: This is a 53 year old male with bilateral BKAs, now admitted for inpatient rehabilitation. He is medically complex with recent infection and poorly controlled diabetes post-operatively, which is now being addressed. He presents as psychologically resilient and highly motivated for recovery, drawing on his  previous successful rehabilitation experience. His primary  concerns are related to understanding his current medical plan, particularly the results of his recent chest X-ray, and ensuring his diabetes is managed effectively to support healing.  Plan/Recommendations: 1. Continue to monitor for cognitive or emotional sequelae of his complex medical course. 2. Support patient's coping and motivation for engagement in PT/OT. 3. Reviewed chest  X-ray results with PA to convey to the patient. 4. Team conference is scheduled for tomorrow (08/30/2024) to establish a formal rehabilitation plan and estimated discharge date. 5. Reinforced the importance of adhering to therapy restrictions to prevent complications and ensure predictable healing. Advised him not to perform activities he is told to avoid by the orthopedic and therapy teams. 6. Provided psychoeducation regarding inpatient diabetes management protocols (sliding scale) versus home regimens, and the rationale for close monitoring during the acute post-operative healing phase.           Electronically Signed   _______________________ Norleen Asa, Psy.D. Clinical Neuropsychologist

## 2024-08-29 NOTE — Progress Notes (Signed)
 Physical Therapy Session Note  Patient Details  Name: Sean Carter MRN: [id number] Date of Birth: 1971/06/05  Today's Date: 08/29/2024 PT Individual Time: 0800-0858 PT Individual Time Calculation (min): 58 min   Short Term Goals: Week 1:  PT Short Term Goal 1 (Week 1): STG = LTG d/t ELOS  Skilled Therapeutic Interventions/Progress Updates:   Received pt semi-reclined in bed eating breakfast. Pt agreeable to PT treatment and reported pain 2/10 in L residual limb. Pt with concerns regarding medication - MD notified. Session with emphasis on functional mobility/transfers, limb care, generalized strengthening and endurance, dynamic sitting balance/coordination, and WC mobility.   While eating breakfast discussed DME - pt reports having old WC from family and will need new one; placed order for 18x18 manual WC. Removed dressing from distal end of R residual limb to inspect wound. Agreed that pt should not be wearing prosthetic with current wound - plan to consult [facility]. RN arrived to inspect wound and administer medication. Pt transferred semi-reclined<>sitting EOB with supervision and performed PA transfer into [facility] with supervision with assist to stabilize WC. Requested order from MD for size 2XL shrinkers.   Pt performed WC mobility 127ft using BUE and supervision to main therapy gym with emphasis on UE strength/coordination. Pt able to remove legrests with min hand over hand cues and performed PA transfer onto mat with close supervision and assist to stabilize WC. Pt performed 2x10 tricep pushups, rows with blue TB 2x10, lateral trunk rotations 1x10 bilaterally and diagonal PNF 1x10 bilaterally with 4lb medicine ball while sitting on wobble disc. Pt reported urge to have BM - performed PA transfer into [facility] with supervision and transported back to room in [facility] dependently. Performed lateral scoot onto drop arm bedside commode with heavy min A and MD arrived for morning rounds. Pt able to remove pants  via lateral leans and left sitting on bedside commode left in care of NT due to time restrictions.   Therapy Documentation Precautions:  Precautions Precautions: Fall Recall of Precautions/Restrictions: Intact Precaution/Restrictions Comments: healing wound on distal end of residual limb Required Braces or Orthoses: Other Brace Other Brace: L limb protector Restrictions Weight Bearing Restrictions Per Provider Order: Yes LLE Weight Bearing Per Provider Order: Non weight bearing  Therapy/Group: Individual Therapy Therisa HERO Zaunegger Therisa Stains PT, DPT 08/29/2024, 7:00 AM

## 2024-08-29 NOTE — Progress Notes (Signed)
 Inpatient Rehabilitation Center Individual Statement of Services  Patient Name:  Sean Carter  Date:  08/29/2024  Welcome to the Inpatient Rehabilitation Center.  Our goal is to provide you with an individualized program based on your diagnosis and situation, designed to meet your specific needs.  With this comprehensive rehabilitation program, you will be expected to participate in at least 3 hours of rehabilitation therapies Monday-Friday, with modified therapy programming on the weekends.  Your rehabilitation program will include the following services:  Physical Therapy (PT), Occupational Therapy (OT), 24 hour per day rehabilitation nursing, Therapeutic Recreaction (TR), Neuropsychology, Care Coordinator, Rehabilitation Medicine, Nutrition Services, and Pharmacy Services  Weekly team conferences will be held on Wednesday to discuss your progress.  Your Inpatient Rehabilitation Care Coordinator will talk with you frequently to get your input and to update you on team discussions.  Team conferences with you and your family in attendance may also be held.  Expected length of stay: 7-10 days  Overall anticipated outcome: Independent with assistive device   Depending on your progress and recovery, your program may change. Your Inpatient Rehabilitation Care Coordinator will coordinate services and will keep you informed of any changes. Your Inpatient Rehabilitation Care Coordinator's name and contact numbers are listed  below.  The following services may also be recommended but are not provided by the Inpatient Rehabilitation Center:  Driving Evaluations Home Health Rehabiltiation Services Outpatient Rehabilitation Services Vocational Rehabilitation   Arrangements will be made to provide these services after discharge if needed.  Arrangements include referral to agencies that provide these services.  Your insurance has been verified to be: AETNA / AETNA [employer] [facility]  Your primary doctor is:   Howell Lunger, DO  Pertinent information will be shared with your doctor and your insurance company.  Inpatient Rehabilitation Care Coordinator:  [name] SIERRAS [phone] or ELIGAH BRINKS  Information discussed with and copy given to patient by: Waverly Loreli, 08/29/2024, 1:59 PM

## 2024-08-29 NOTE — Inpatient Diabetes Management (Signed)
 Inpatient Diabetes Program Recommendations  AACE/ADA: New Consensus Statement on Inpatient Glycemic Control (2015)  Target Ranges:  Prepandial:   less than 140 mg/dL      Peak postprandial:   less than 180 mg/dL (1-2 hours)      Critically ill patients:  140 - 180 mg/dL   Lab Results  Component Value Date   GLUCAP 241 (H) 08/29/2024   HGBA1C 6.5 (H) 08/22/2024    Review of Glycemic Control  Latest Reference Range & Units 08/28/24 21:01 08/28/24 23:05 08/29/24 06:38 08/29/24 11:34  Glucose-Capillary 70 - 99 mg/dL 588 (H) 642 (H) 738 (H) 241 (H)   Diabetes history: DM 2 Outpatient Diabetes medications:  Humalog  20 units bid Basaglar  40 units daily Current orders for Inpatient glycemic control:  Novolog  0-9 units tid with meals and HS Novolog  5 units tid with meals (started today) Semglee  40 units daily Acarbose 50 mg tid  Inpatient Diabetes Program Recommendations:    Agree with the addition of meal coverage today and increase of basal insulin . Prior to admit, A1C was good on regimen he was on prior to amputation.  Recommend continued adjustments in inpatient medications (likely will need more Novolog  meal coverage).  Would not recommend Acarbose due to patient already on meal coverage insulin  at home.  This likely will not help much and can have side effect of gas/stomach pains?  Will follow.   Thanks,  Randall Bullocks, RN, BC-ADM Inpatient Diabetes Coordinator Pager [phone]  (8a-5p)

## 2024-08-29 NOTE — Progress Notes (Addendum)
 Physical Therapy Session Note  Patient Details  Name: Sean Carter MRN: [id number] Date of Birth: 1971/04/09  Today's Date: 08/29/2024 PT Individual Time: 0800-0858 PT Individual Time Calculation (min): 58 min   Short Term Goals: Week 1:  PT Short Term Goal 1 (Week 1): STG = LTG d/t ELOS  Skilled Therapeutic Interventions/Progress Updates: Patient sitting in WC on entrance to room. Patient alert and agreeable to PT session.   Patient with no complaints of pain.   Therapeutic Activity: Bed Mobility: Pt performed sitting pivot in bed to return to supine with supervision fro safety Transfers: Pt performed a/p transfer from [place]<EOB with light CGA for safety and PTA stabilizing WC.   Therapeutic Exercise: Pt performed the following exercises with therapist providing the described cuing and facilitation for improvement. - Pt propelled WC with B UE; to increase endurance and strength - External rotation B UE sitting in WC. 2 x close to fatigue with red theraband, then 1 x  close to fatigue with green theraband. Max multimodal cues to obtain movement (maintaining B UE in 90* abduction + 90* elbow flexion and to externally rotate with thumbs pointing behind pt   Patient supine in bee at end of session with brakes locked, and all needs within reach.      Therapy Documentation Precautions:  Precautions Precautions: Fall Recall of Precautions/Restrictions: Intact Precaution/Restrictions Comments: healing wound on distal end of residual limb Required Braces or Orthoses: Other Brace Other Brace: L limb protector Restrictions Weight Bearing Restrictions Per Provider Order: Yes LLE Weight Bearing Per Provider Order: Non weight bearing  Therapy/Group: Individual Therapy  Shrihaan Porzio A Dink Creps 08/29/2024, 12:39 PM

## 2024-08-29 NOTE — Progress Notes (Signed)
 PROGRESS NOTE   Subjective/Complaints: Patient is upset that he is receiving penicillin  when he has history of allergy, discussed with patient again that we have discussed this with ID and pharmacy  ROS: Denies CP, SOB, abdominal pain, nausea, vomiting  pain is well controlled, +constipation- improved   Objective:   DG Chest 2 View Result Date: 08/28/2024 EXAM: 2 VIEW(S) XRAY OF THE CHEST 08/28/2024 05:06:00 PM COMPARISON: 09/02/2023 CLINICAL HISTORY: Cough FINDINGS: LUNGS AND PLEURA: Low lung volumes. No focal pulmonary opacity. No pulmonary edema. No pleural effusion. No pneumothorax. HEART AND MEDIASTINUM: No acute abnormality of the cardiac and mediastinal silhouettes. BONES AND SOFT TISSUES: No acute osseous abnormality. IMPRESSION: 1. No acute cardiopulmonary process. Electronically signed by: Norman Gatlin MD 08/28/2024 08:00 PM EST RP Workstation: [id number]    Recent Labs    08/28/24 0515  WBC 10.8*  HGB 9.3*  HCT 29.0*  PLT 451*   Recent Labs    08/28/24 0515 08/29/24 0455  NA 130* 132*  K 5.2* 5.2*  CL 93* 97*  CO2 26 23  GLUCOSE 337* 268*  BUN 24* 22*  CREATININE 0.96 0.94  CALCIUM  8.4* 8.6*    Intake/Output Summary (Last 24 hours) at 08/29/2024 1128 Last data filed at 08/29/2024 1053 Gross per 24 hour  Intake 1627.59 ml  Output 1750 ml  Net -122.41 ml        Physical Exam: Vital Signs Blood pressure 127/66, pulse 64, temperature 98.5 F (36.9 C), resp. rate 18, height 5' 8 (1.727 m), weight 93.1 kg, SpO2 97%.   Gen: no distress, normal appearing HEENT: oral mucosa pink and moist, NCAT Cardio: RRR Chest: CTAB, normal effort, normal rate of breathing Abdominal:     Palpations: Abdomen is soft.     Comments: Very protuberant; non distended; normoactive; Soft  Musculoskeletal:     Cervical back: Neck supple. No tenderness.     Comments: Ue's 5/5 throughout B/L RLE_ HF/KE/KF 5/5- has R  BKA- with scabs on R BKA from standing 9-12 hours/day at work LLE- HF/KE/KF at least 4/5- bulbous L BKA, stable 11/11 Skin:    General: Skin is warm.     Comments: A couple of open and abraded areas on R-BKA. L-BKA with dry dressing.  Per last pic- has oozing from incision- dog ears (+) and bulbous L Forearm IV- not leaking anymore- constantly on IV Pen G  Neurological:     Mental Status: He is alert and oriented to person, place, and time.     Comments: Intact to light touch in all 4 extremities Ox3- keeps eyes closed due to fuzziness  Psychiatric:        Mood and Affect: Mood normal.        Behavior: Behavior normal.    Assessment/Plan: 1. Functional deficits which require 3+ hours per day of interdisciplinary therapy in a comprehensive inpatient rehab setting. Physiatrist is providing close team supervision and 24 hour management of active medical problems listed below. Physiatrist and rehab team continue to assess barriers to discharge/monitor patient progress toward functional and medical goals  Care Tool:  Bathing    Body parts bathed by patient: Right arm, Left arm, Chest, Abdomen, Front  perineal area, Buttocks, Right upper leg, Left upper leg, Face     Body parts n/a: Left lower leg, Right lower leg   Bathing assist Assist Level: Moderate Assistance - Patient 50 - 74%     Upper Body Dressing/Undressing Upper body dressing   What is the patient wearing?: Hospital gown only    Upper body assist Assist Level: Minimal Assistance - Patient > 75%    Lower Body Dressing/Undressing Lower body dressing      What is the patient wearing?: Ace wrap/stump shrinker, Orthosis     Lower body assist Assist for lower body dressing: Moderate Assistance - Patient 50 - 74%     Toileting Toileting    Toileting assist Assist for toileting: Moderate Assistance - Patient 50 - 74%     Transfers Chair/bed transfer  Transfers assist     Chair/bed transfer assist level:  Supervision/Verbal cueing     Locomotion Ambulation   Ambulation assist   Ambulation activity did not occur: Safety/medical concerns (old R BKA, new L BKA)          Walk 10 feet activity   Assist  Walk 10 feet activity did not occur: Safety/medical concerns (old R BKA, new L BKA)        Walk 50 feet activity   Assist Walk 50 feet with 2 turns activity did not occur: Safety/medical concerns (old R BKA, new L BKA)         Walk 150 feet activity   Assist Walk 150 feet activity did not occur: Safety/medical concerns (old R BKA, new L BKA)         Walk 10 feet on uneven surface  activity   Assist Walk 10 feet on uneven surfaces activity did not occur: Safety/medical concerns (old R BKA, new L BKA)         Wheelchair     Assist Is the patient using a wheelchair?: Yes Type of Wheelchair: Manual    Wheelchair assist level: Independent Max wheelchair distance: 150'    Wheelchair 50 feet with 2 turns activity    Assist        Assist Level: Independent   Wheelchair 150 feet activity     Assist      Assist Level: Independent   Blood pressure 127/66, pulse 64, temperature 98.5 F (36.9 C), resp. rate 18, height 5' 8 (1.727 m), weight 93.1 kg, SpO2 97%.  Medical Problem List and Plan: 1. Functional deficits secondary to L BKA in setting of old R BKA due to osteomyelitis and gangrene             -patient may not shower             -ELOS/Goals: 7-10 days supervision to mod I at w/c level Chart and therapy notes reviewed, continue CIR, discussed patient's progress with therapy Grounds pass ordered Vitamin D3/Metanx/Vitamin B/C complex ordered F/u with me or Fidela in clinic within 1 month Would benefit from home health aide upon discharge  Continue CIR PT, OT  2.  Antithrombotics: -DVT/anticoagulation:  Pharmaceutical: Lovenox              -antiplatelet therapy: ASA 3. Back pain: CT spine ordered to assess for discitis.  Hydrocodone--->changed to tramadol  prn.             --Ketorolac  X 5 days thru thru 11/08. Monitor renal status/BP stopped Norco due to LFT elevation 4. Mood/Behavior/Sleep: LCSW to follow for evaluation and support.              -  antipsychotic agents: N/A 5. Neuropsych/cognition: This patient is capable of making decisions on his own behalf. 6. Skin/Wound Care: Routine pressure relief measures.  --Low carb protein supplements for wound healing.  7. Fluids/Electrolytes/Nutrition: Monitor I/O. Check CMET in am 8. Group B strep bactermia: Antibiotics changed to  PCN G             --MRI TL spine limited by claustrophobia.             --2 d echo OK  Continue IV Penicillin , can switch to amoxicillin  upon d/c as per ID  9. T2DM: Hgb A1c 6.5 and well controlled on Insulin  glargine 40 units w/20 units lispro bid.  --Will monitor BS ac/hs and use SSI for elevated BS             --Increase basal insulin  to 30 units. D/c feeding supplement. Acarbose added-increase to 50mg  TID  Increase semglee  to 40U HS, added novolog  7U with meals  CBG (last 3)  Recent Labs    08/28/24 2101 08/28/24 2305 08/29/24 0638  GLUCAP 411* 357* 261*   10.  HTN: Monitor BP TID--continue amlodipine  10mg  daily, acarbose added as above for diabetes and can also help HTN      08/29/2024    3:41 AM 08/28/2024    7:40 PM 08/28/2024    2:03 PM  Vitals with BMI  Systolic 127 151 873  Diastolic 66 70 70  Pulse 64 72 64     11. Abnormal LFTs: reviewed and continue to uptrend, d/c statin until they start to come down, repeat tomorrow.   - 11/10 alk phos, AST and ALT improved today, continue current regimen  12. ABLA: Hgb reviewed and was 10.8 on 11/8, trending upward.   - 11/10 hemoglobin a little lower at 9.3, continue to monitor  13. Acute renal failure: Due to infection and has resolved.  Cr reviewed and is stable  14.  Hyponatremia/hyperkalemia  - 11/10 Lokelma  today recheck tomorrow, hyponatremia appears  chronic   LOS: 5 days A FACE TO FACE EVALUATION WAS PERFORMED  Mandeep Ferch P Mikael Skoda 08/29/2024, 11:28 AM

## 2024-08-29 NOTE — Progress Notes (Signed)
 Orthopedic Tech Progress Note Patient Details:  Sean Carter Jul 25, 1971 [id number]  Called in order to [employer] for 3 2XL BKA SHRINKERS    [name], male   DOB: [birth year], 53 y.o.   MRN: [id number]  Sean Carter 08/29/2024, 8:45 AM

## 2024-08-29 NOTE — Progress Notes (Signed)
 Occupational Therapy Session Note  Patient Details  Name: Sean Carter MRN: [id number] Date of Birth: 03-12-1971  Today's Date: 08/29/2024 OT Individual Time: 1445-1530 OT Individual Time Calculation (min): 45 min    Short Term Goals: Week 1:  OT Short Term Goal 1 (Week 1): STG=LTG d/t ELOS  Skilled Therapeutic Interventions/Progress Updates:  Skilled OT session completed to address limb loss education and discharge planning. Pt received supine in bed, agreeable to participate in therapy. Pt reports no pain.  Pt requesting to complete shrinker care on arrival. Pt independently doffed 4XL shrinker, pt donned 3XL shrinker with Min A to place over residual limb. Once over staples, pt able to complete donning. Pt reports fear of pain when donning independently, OT provides education on shrinker care and stretching shrinker prior to donning to allow for ease of application. Pt verbalized understanding. OT prompted pt to verbalize steps of shrinker care in the home environment, pt able to recall hygiene schedule and avoiding machine washing/drying; displayed difficulty recalling how to wash shrinker. OT provided education on cleansing shrinker, pt verbalized understanding. OT discussed home environment with pt regarding toileting as WC will not currently fit in bathroom. OT demo'd 3n1 BSC to increase independence with toileting. Pt would like to consult wife prior to DME confirmation; however, understands it will support independence. Pt expressing frustration with transfers without standing and understands he can not don prosthetic. OT provides therapeutic use of self to encourage pt and offers peer support services for amputees. Pt receptive and would like to talk to others with B-BKAs. EOB>WC PA transfer with CGA, pt reports more comfort with this transfer. Pt self-propels to ADL kitchen with A to manage IV. Pt navigates stove, refrigerator, and cabinets with supervision, VC for safety when  positioning WC at oven. Pt self-propels back to room, WC>EOB AP transfer with CGA. Pt supine in bed with all needs met and wife present.   Therapy Documentation Precautions:  Precautions Precautions: Fall Recall of Precautions/Restrictions: Intact Precaution/Restrictions Comments: healing wound on distal end of residual limb Required Braces or Orthoses: Other Brace Other Brace: L limb protector Restrictions Weight Bearing Restrictions Per Provider Order: Yes LLE Weight Bearing Per Provider Order: Non weight bearing   Therapy/Group: Individual Therapy  Breyona Swander Woods-Chance, MS, OTR/L 08/29/2024, 8:00 AM

## 2024-08-30 DIAGNOSIS — S88112A Complete traumatic amputation at level between knee and ankle, left lower leg, initial encounter: Secondary | ICD-10-CM | POA: Diagnosis not present

## 2024-08-30 LAB — GLUCOSE, CAPILLARY
Glucose-Capillary: 233 mg/dL — ABNORMAL HIGH (ref 70–99)
Glucose-Capillary: 192 mg/dL — ABNORMAL HIGH (ref 70–99)
Glucose-Capillary: 200 mg/dL — ABNORMAL HIGH (ref 70–99)
Glucose-Capillary: 208 mg/dL — ABNORMAL HIGH (ref 70–99)

## 2024-08-30 MED ORDER — INSULIN ASPART 100 UNIT/ML IJ SOLN
10.0000 [IU] | Freq: Three times a day (TID) | INTRAMUSCULAR | Status: DC
  Administered 2024-08-30 – 2024-08-31 (×3): 10 [IU] via SUBCUTANEOUS
  Filled 2024-08-30 (×3): qty 10

## 2024-08-30 MED ORDER — SODIUM ZIRCONIUM CYCLOSILICATE 10 G PO PACK
10.0000 g | PACK | Freq: Once | ORAL | Status: AC
  Administered 2024-08-30: 10 g via ORAL
  Filled 2024-08-30: qty 1

## 2024-08-30 NOTE — Progress Notes (Signed)
 Occupational Therapy Note  Patient Details  Name: TADEN WITTER MRN: [id number] Date of Birth: 19-Jan-1971  Occupational Therapist participated in the interdisciplinary team conference, providing clinical information regarding the patient's current status, treatment goals, and weekly focus, including any barriers that need to be addressed. Please see the Inpatient Rehabilitation Team Conference and Plan of Care Update for further details.    Sharifa Bucholz Woods-Chance, MS, OTR/L 08/30/2024, 5:40 PM

## 2024-08-30 NOTE — Discharge Instructions (Addendum)
 Inpatient Rehab Discharge Instructions  Sean Carter Discharge date and time:  09/03/24  Activities/Precautions/ Functional Status: Activity: no lifting, driving, or strenuous exercise till cleared by MD Diet: diabetic diet Wound Care: Wash with soap and water . Keep clean and dry. Apply dry dressing and compressive sock.    Functional status:  ___ No restrictions     ___ Walk up steps independently ___ 24/7 supervision/assistance   ___ Walk up steps with assistance ___ Intermittent supervision/assistance  ___ Bathe/dress independently _No__ Walking/Transfers only    ___ Bathe/dress with assistance ___ Walk Independently    ___ Shower independently ___ Walk with assistance    ___ Shower with assistance _X__ No alcohol      ___ Return to work/school ________  Special Instructions:  COMMUNITY REFERRALS UPON DISCHARGE:    Outpatient: PT     OT                Agency: [facility] Phone: [phone]              Appointment Date/Time: *Please expect follow-up within 7-10 business days to schedule your appointment. If you have not received follow-up, be sure to contact the site directly.*   Medical Equipment/Items Ordered: 18x18 wheelchair with accessories and 24in slideboard                                                 Agency/Supplier: [employer]  My questions have been answered and I understand these instructions. I will adhere to these goals and the provided educational materials after my discharge from the hospital.  Patient/Caregiver Signature _______________________________ Date __________  Clinician Signature _______________________________________ Date __________  Please bring this form and your medication list with you to all your follow-up doctor's appointments.

## 2024-08-30 NOTE — Progress Notes (Signed)
 [name], male   DOB: 10/29/1970, 53 y.o.   MRN: [id number]  Have reviewed team conference with pt and family. Both aware and agreeable with targeted d/c date of 11/17 and goals of Independent with assistive device.   Therapy recommending OP PT/OT.   Will order 18x18 wheelchair. Patient declined DAC.

## 2024-08-30 NOTE — Progress Notes (Signed)
 Physical Therapy Session Note  Patient Details  Name: Sean Carter MRN: [id number] Date of Birth: 08/02/1971  Today's Date: 08/30/2024 PT Individual Time: 1031-1057 PT Individual Time Calculation (min): 26 min   Short Term Goals: Week 1:  PT Short Term Goal 1 (Week 1): STG = LTG d/t ELOS  Skilled Therapeutic Interventions/Progress Updates:        Pt presents in bed to start, hesitant to mobilize OOB since just returning to bed from prior therapy session. Focused session on education and DC planning. Pt able to confirm adequate progress in therapy but voices his frustration with inability to wear his RLE prosthesis due to skin breakdown. Pt reports his main concern is stand pivot transfers using the prosthesis and becoming more familiar with RW management during transfers. Discussed limb loss in OPPT follow up, home safety, and functional progress in therapies. Pt ended session as found with needs met, pt aware of upcoming team conference to determine his DC date.      Therapy Documentation Precautions:  Precautions Precautions: Fall Recall of Precautions/Restrictions: Intact Precaution/Restrictions Comments: healing wound on distal end of residual limb Required Braces or Orthoses: Other Brace Other Brace: L limb protector Restrictions Weight Bearing Restrictions Per Provider Order: Yes LLE Weight Bearing Per Provider Order: Non weight bearing General:      Therapy/Group: Individual Therapy  Sherlean SHAUNNA Perks 08/30/2024, 7:47 AM

## 2024-08-30 NOTE — Plan of Care (Signed)
  Problem: Consults Goal: RH LIMB LOSS PATIENT EDUCATION Description: Description: See Patient Education module for eduction specifics. Outcome: Progressing Goal: Nutrition Consult-if indicated Outcome: Progressing Goal: Diabetes Guidelines if Diabetic/Glucose > 140 Description: If diabetic or lab glucose is > 140 mg/dl - Initiate Diabetes/Hyperglycemia Guidelines & Document Interventions  Outcome: Progressing   Problem: RH BOWEL ELIMINATION Goal: RH STG MANAGE BOWEL WITH ASSISTANCE Description: STG Manage Bowel with mod I Assistance. Outcome: Progressing Goal: RH STG MANAGE BOWEL W/MEDICATION W/ASSISTANCE Description: STG Manage Bowel with Medication with Assistance. Outcome: Progressing   Problem: RH SAFETY Goal: RH STG ADHERE TO SAFETY PRECAUTIONS W/ASSISTANCE/DEVICE Description: STG Adhere to Safety Precautions With cues Assistance/Device. Outcome: Progressing   Problem: RH PAIN MANAGEMENT Goal: RH STG PAIN MANAGED AT OR BELOW PT'S PAIN GOAL Description: < 4 with prns Outcome: Progressing   Problem: RH KNOWLEDGE DEFICIT LIMB LOSS Goal: RH STG INCREASE KNOWLEDGE OF SELF CARE AFTER LIMB LOSS Description: Patient will be able to manage care at discharge using educational resources for medication and dietary modification independently Outcome: Progressing

## 2024-08-30 NOTE — Progress Notes (Signed)
 Occupational Therapy Session Note  Patient Details  Name: Sean Carter MRN: [id number] Date of Birth: 04/19/1971  Today's Date: 08/30/2024 OT Individual Time: 0800-0900 OT Individual Time Calculation (min): 60 min    Short Term Goals: Week 1:  OT Short Term Goal 1 (Week 1): STG=LTG d/t ELOS  Skilled Therapeutic Interventions/Progress Updates:  Skilled OT session completed to address ADL retraining, limb loss education, and functional transfers. Pt received supine in bed, agreeable to participate in therapy. Pt reports no pain.  Pt completed EOB>WC PA transfer with set-up A, OT prompted pt to direct care for [facility] placement in the home environment. Pt self-propelled to sink and completed sink level ADLs and personal grooming with set-up A to retrieve clothing and wash cloths. Pt completed UB dressing with Min A d/t current IV as pt requests A d/t pulling IV line out yesterday. LB dressing with set-up A. OT prompted pt to recall shrinker mgmt, pt able to recall all steps for hygiene and shrinker mgmt displaying good carryover from previous session. Pt donned/doffed shrinker with supervsion, VC for sequencing. Pt completed shrinker care at sink independently. OT provided DABSC in support of functional transfers and demo transfer with SB. Pt initially not receptive of BSC in home and would prefer toilet; however, verbalized he would not be able to enter bathroom at [facility] level but can pull up using BUE on sink and sit on toilet. OT provides education safety awareness and need for DABSC to reduce risk for falls and increase independence with toileting. Pt verbalized understanding and will f/u transfer at next session. Pt seated in WC with all needs in reach.   Precautions:  Precautions Precautions: Fall Recall of Precautions/Restrictions: Intact Precaution/Restrictions Comments: healing wound on distal end of residual limb Required Braces or Orthoses: Other Brace Other Brace: L limb  protector Restrictions Weight Bearing Restrictions Per Provider Order: Yes LLE Weight Bearing Per Provider Order: Non weight bearing   Therapy/Group: Individual Therapy  Odelia Graciano Woods-Chance, MS, OTR/L 08/30/2024, 5:08 PM

## 2024-08-30 NOTE — Progress Notes (Signed)
 PROGRESS NOTE   Subjective/Complaints: 2 2XL shrinkers ordered He has no complaints Lokelma  ordered for hyperkalemia He asks whether he still has infection  ROS: Denies CP, SOB, abdominal pain, nausea, vomiting  pain is well controlled, +constipation- improved   Objective:   DG Chest 2 View Result Date: 08/28/2024 EXAM: 2 VIEW(S) XRAY OF THE CHEST 08/28/2024 05:06:00 PM COMPARISON: 09/02/2023 CLINICAL HISTORY: Cough FINDINGS: LUNGS AND PLEURA: Low lung volumes. No focal pulmonary opacity. No pulmonary edema. No pleural effusion. No pneumothorax. HEART AND MEDIASTINUM: No acute abnormality of the cardiac and mediastinal silhouettes. BONES AND SOFT TISSUES: No acute osseous abnormality. IMPRESSION: 1. No acute cardiopulmonary process. Electronically signed by: Norman Gatlin MD 08/28/2024 08:00 PM EST RP Workstation: [id number]    Recent Labs    08/28/24 0515  WBC 10.8*  HGB 9.3*  HCT 29.0*  PLT 451*   Recent Labs    08/28/24 0515 08/29/24 0455  NA 130* 132*  K 5.2* 5.2*  CL 93* 97*  CO2 26 23  GLUCOSE 337* 268*  BUN 24* 22*  CREATININE 0.96 0.94  CALCIUM  8.4* 8.6*    Intake/Output Summary (Last 24 hours) at 08/30/2024 1058 Last data filed at 08/30/2024 0851 Gross per 24 hour  Intake 1360 ml  Output 1325 ml  Net 35 ml        Physical Exam: Vital Signs Blood pressure 139/71, pulse 62, temperature 98.5 F (36.9 C), temperature source Oral, resp. rate 18, height 5' 8 (1.727 m), weight 93.1 kg, SpO2 98%.   Gen: no distress, normal appearing HEENT: oral mucosa pink and moist, NCAT Cardio: RRR Chest: CTAB, normal effort, normal rate of breathing Abdominal:     Palpations: Abdomen is soft.     Comments: Very protuberant; non distended; normoactive; Soft  Musculoskeletal:     Cervical back: Neck supple. No tenderness.     Comments: Ue's 5/5 throughout B/L RLE_ HF/KE/KF 5/5- has R BKA- with scabs on R  BKA from standing 9-12 hours/day at work LLE- HF/KE/KF 5/5- bulbous L BKA, improved 11/12 Skin:    General: Skin is warm.     Comments: A couple of open and abraded areas on R-BKA. L-BKA with dry dressing.  Per last pic- has oozing from incision- dog ears (+) and bulbous L Forearm IV- not leaking anymore- constantly on IV Pen G  Neurological:     Mental Status: He is alert and oriented to person, place, and time.     Comments: Intact to light touch in all 4 extremities Ox3- keeps eyes closed due to fuzziness  Psychiatric:        Mood and Affect: Mood normal.        Behavior: Behavior normal.    Assessment/Plan: 1. Functional deficits which require 3+ hours per day of interdisciplinary therapy in a comprehensive inpatient rehab setting. Physiatrist is providing close team supervision and 24 hour management of active medical problems listed below. Physiatrist and rehab team continue to assess barriers to discharge/monitor patient progress toward functional and medical goals  Care Tool:  Bathing    Body parts bathed by patient: Right arm, Left arm, Chest, Abdomen, Front perineal area, Buttocks, Right upper leg, Left  upper leg, Face     Body parts n/a: Left lower leg, Right lower leg   Bathing assist Assist Level: Moderate Assistance - Patient 50 - 74%     Upper Body Dressing/Undressing Upper body dressing   What is the patient wearing?: Hospital gown only    Upper body assist Assist Level: Minimal Assistance - Patient > 75%    Lower Body Dressing/Undressing Lower body dressing      What is the patient wearing?: Ace wrap/stump shrinker, Orthosis     Lower body assist Assist for lower body dressing: Moderate Assistance - Patient 50 - 74%     Toileting Toileting    Toileting assist Assist for toileting: Moderate Assistance - Patient 50 - 74%     Transfers Chair/bed transfer  Transfers assist     Chair/bed transfer assist level: Supervision/Verbal cueing      Locomotion Ambulation   Ambulation assist   Ambulation activity did not occur: Safety/medical concerns (old R BKA, new L BKA)          Walk 10 feet activity   Assist  Walk 10 feet activity did not occur: Safety/medical concerns (old R BKA, new L BKA)        Walk 50 feet activity   Assist Walk 50 feet with 2 turns activity did not occur: Safety/medical concerns (old R BKA, new L BKA)         Walk 150 feet activity   Assist Walk 150 feet activity did not occur: Safety/medical concerns (old R BKA, new L BKA)         Walk 10 feet on uneven surface  activity   Assist Walk 10 feet on uneven surfaces activity did not occur: Safety/medical concerns (old R BKA, new L BKA)         Wheelchair     Assist Is the patient using a wheelchair?: Yes Type of Wheelchair: Manual    Wheelchair assist level: Independent Max wheelchair distance: 150'    Wheelchair 50 feet with 2 turns activity    Assist        Assist Level: Independent   Wheelchair 150 feet activity     Assist      Assist Level: Independent   Blood pressure 139/71, pulse 62, temperature 98.5 F (36.9 C), temperature source Oral, resp. rate 18, height 5' 8 (1.727 m), weight 93.1 kg, SpO2 98%.  Medical Problem List and Plan: 1. Functional deficits secondary to L BKA in setting of old R BKA due to osteomyelitis and gangrene             -patient may not shower             -ELOS/Goals: 7-10 days supervision to mod I at w/c level Chart and therapy notes reviewed, continue CIR, discussed patient's progress with therapy Grounds pass ordered Vitamin D3/Metanx/Vitamin B/C complex ordered F/u with me or Fidela in clinic within 1 month Would benefit from home health aide upon discharge  Continue CIR PT, OT  2.  Antithrombotics: -DVT/anticoagulation:  Pharmaceutical: continue Lovenox              -antiplatelet therapy: ASA  3. Back pain: CT spine ordered to assess for discitis.  Hydrocodone--->changed to tramadol  prn, continue             --Ketorolac  X 5 days thru thru 11/08. Monitor renal status/BP stopped Norco due to LFT elevation  4. Mood/Behavior/Sleep: LCSW to follow for evaluation and support.              -  antipsychotic agents: N/A  5. Neuropsych/cognition: This patient is capable of making decisions on his own behalf.  6. Skin/Wound Care: Routine pressure relief measures.  --Low carb protein supplements for wound healing.   7. Fluids/Electrolytes/Nutrition: Monitor I/O. Check CMET in am  8. Group B strep bactermia: Antibiotics changed to  PCN G             --MRI TL spine limited by claustrophobia.             --2 d echo OK  Continue IV Penicillin , can switch to amoxicillin  upon d/c as per ID  9. T2DM: Hgb A1c 6.5 and well controlled on Insulin  glargine 40 units w/20 units lispro bid.  --Will monitor BS ac/hs and use SSI for elevated BS             --Increase basal insulin  to 30 units. D/c feeding supplement. Acarbose added-increase to 50mg  TID  Increase semglee  to 40U HS,increase novolog  10U with meals  CBG (last 3)  Recent Labs    08/29/24 1631 08/29/24 2107 08/30/24 0537  GLUCAP 262* 307* 233*   10.  HTN: Monitor BP TID--continue amlodipine  10mg  daily, acarbose added as above for diabetes and can also help HTN      08/30/2024    4:59 AM 08/29/2024    7:46 PM 08/29/2024   12:56 PM  Vitals with BMI  Systolic 139 121 866  Diastolic 71 64 71  Pulse 62 66 73     11. Abnormal LFTs: reviewed and continue to uptrend, d/c statin until they start to come down, repeat tomorrow.   - 11/10 alk phos, AST and ALT improved today, continue current regimen  12. ABLA: Hgb reviewed and was 10.8 on 11/8, trending upward.   - 11/10 hemoglobin a little lower at 9.3, continue to monitor  13. Acute renal failure: Due to infection and has resolved.  Cr reviewed and is stable  14.  Hyponatremia/hyperkalemia  -lokelma  today, repeat BMP  tomorrow   LOS: 6 days A FACE TO FACE EVALUATION WAS PERFORMED  Sven P Jayce Boyko 08/30/2024, 10:58 AM

## 2024-08-30 NOTE — Progress Notes (Signed)
 Physical Therapy Session Note  Patient Details  Name: Sean Carter MRN: [id number] Date of Birth: 23-Nov-1970  Today's Date: 08/30/2024 PT Individual Time: 9053-8969 and 8698-8654 PT Individual Time Calculation (min): 44 min and 44 min  Short Term Goals: Week 1:  PT Short Term Goal 1 (Week 1): STG = LTG d/t ELOS  Skilled Therapeutic Interventions/Progress Updates:   Treatment Session 1 Received pt sitting in WC, pt agreeable to PT treatment, and did not c/o pain during session. Session with emphasis on functional mobility/transfers, generalized strengthening and endurance, dynamic sitting balance/coordination, and limb loss education. Removed dressing from R residual limb and noted wound scabbed over. Donned 4XL shrinker and L limb guard with supervision while educating pt on healing properties of shrinker. MD arrived for morning rounds to discuss infection and pt requesting another consult from vein and vascular.   Pt performed WC mobility 171ft x 2 trials using BUE and supervision to/from main therapy gym with emphasis on BUE strength. Pt performed PA/PA transfer to/from mat with supervision and assist to stabilize WC. Pt able to remove legrests without assist, encouraged pt to remove them completely for transfer instead of sliding to the side. Worked on dynamic sitting balance/coordination and core strength performing modified crunches on wedge<>ball toss/catch on rebounder 2x10 transitioning to modified crunch<>lateral trunk rotation<>ball toss/catch on rebounder 2x10 with cues for technique.   During session discussed expected healing timeline and progression - encouraged pt to leave prosthetic off until wound heals, then once healed performing transfers only using prosthetic no hopping, then progressing ambulation once he receives 2nd prosthetic. Pt with questions regarding how he will pivot with prosthetic - demonstrated stand<>pivot technique with pt's prosthetic (pt has hydraulic ankle  to allow for increased ROM). Returned to room and requested to return to bed. Performed AP transfer onto bed with close supervision and assist to stabilize WC. Transferred into supine with supervision/mod I and concluded session with pt semi-reclined in bed, needs within reach, and bed alarm on.   Treatment Session 2 Received pt semi-reclined in bed with wife at bedside. Discussed D/C date with pt/wife - pt/wife stating Sunday won't work and requesting Monday - treatment team notified. Pt agreeable to PT treatment and did not state pain level during session. Session with emphasis on functional mobility/transfers, generalized strengthening and endurance, dynamic sitting balance/coordination, and limb loss education. Chris from [place] did not arrive for scheduled consult - will reschedule.   Donned limb guard with supervision and pt transferred semi-reclined<>long sitting, then performed PA transfer into chair with supervision with assist to stabilize WC. Also discussed recommendation for OPPT services upon discharge and pt/wife in agreement. Reinforced to wife need to allow wound on R residual limb to heal before wearing prosthetic - pt/wife's major concern is that he will be unable to pivot to/from toilet without his prosthetic. Discussed option of bringing bedside commode outside of bathroom and encouraged pt to discuss with primary OT. Pt performed WC mobility 168ft using BUE and supervision/mod I with emphasis on UE strength/coordination. Pt performed AP/PA transfer to/from mat with supervision and transferred into prone. Pt performed the following exercises with emphasis on LE strength, ROM, and contracture prevention: -prone SLR x10 bilaterally -prone knee flexion x10 bilaterally -prone<>prone on extended elbows 10x5 second hold  -supine bridges on bolster 2x10 with 3 second hold. Pt transferred supine<>sitting EOM with supervision and donned limb guard with setup assist. Transferred back into WC and  returned to room. Transferred into bed via AP transfer with supervision  and transferred into semi-reclined mod I. Concluded session with pt semi-reclined in bed, needs within reach, and bed alarm on.   Therapy Documentation Precautions:  Precautions Precautions: Fall Recall of Precautions/Restrictions: Intact Precaution/Restrictions Comments: healing wound on distal end of residual limb Required Braces or Orthoses: Other Brace Other Brace: L limb protector Restrictions Weight Bearing Restrictions Per Provider Order: Yes LLE Weight Bearing Per Provider Order: Non weight bearing  Therapy/Group: Individual Therapy Therisa HERO Zaunegger Therisa Stains PT, DPT 08/30/2024, 6:54 AM

## 2024-08-30 NOTE — Progress Notes (Signed)
 Physical Therapy Note  Patient Details  Name: Sean Carter MRN: [id number] Date of Birth: [birth year] Today's Date: 08/30/2024  Physical Therapist participated in the interdisciplinary team conference, providing clinical information regarding the patient's current status, treatment goals, and weekly focus, including any barriers that need to be addressed. Please see the Inpatient Rehabilitation Team Conference and Plan of Care Update for further details.   Therisa HERO Zaunegger Therisa Stains PT, DPT 08/30/2024, 8:33 AM

## 2024-08-30 NOTE — Progress Notes (Signed)
 Orthopedic Tech Progress Note Patient Details:  JENE ORAVEC Jan 06, 1971 [id number]  Patient ID: Glendia ONEIDA Cirri, male   DOB: 05-21-1971, 53 y.o.   MRN: [id number]  Order has been called in for BK Shrinker. Thersia FALCON [place]orn 08/30/2024, 10:48 AM

## 2024-08-31 DIAGNOSIS — S88112A Complete traumatic amputation at level between knee and ankle, left lower leg, initial encounter: Secondary | ICD-10-CM | POA: Diagnosis not present

## 2024-08-31 LAB — GLUCOSE, CAPILLARY
Glucose-Capillary: 204 mg/dL — ABNORMAL HIGH (ref 70–99)
Glucose-Capillary: 195 mg/dL — ABNORMAL HIGH (ref 70–99)
Glucose-Capillary: 144 mg/dL — ABNORMAL HIGH (ref 70–99)
Glucose-Capillary: 189 mg/dL — ABNORMAL HIGH (ref 70–99)

## 2024-08-31 LAB — BASIC METABOLIC PANEL WITH GFR
Anion gap: 9 (ref 5–15)
BUN: 25 mg/dL — ABNORMAL HIGH (ref 6–20)
CO2: 25 mmol/L (ref 22–32)
Calcium: 8.9 mg/dL (ref 8.9–10.3)
Chloride: 98 mmol/L (ref 98–111)
Creatinine, Ser: 0.9 mg/dL (ref 0.61–1.24)
GFR, Estimated: >60 mL/min (ref >60)
Glucose, Bld: 238 mg/dL — ABNORMAL HIGH (ref 70–99)
Potassium: 4.4 mmol/L (ref 3.5–5.1)
Sodium: 132 mmol/L — ABNORMAL LOW (ref 135–145)

## 2024-08-31 LAB — CBC
HCT: 31.4 % — ABNORMAL LOW (ref 39.0–52.0)
Hemoglobin: 10.2 g/dL — ABNORMAL LOW (ref 13.0–17.0)
MCH: 27.1 pg (ref 26.0–34.0)
MCHC: 32.5 g/dL (ref 30.0–36.0)
MCV: 83.5 fL (ref 80.0–100.0)
Platelets: 528 K/uL — ABNORMAL HIGH (ref 150–400)
RBC: 3.76 MIL/uL — ABNORMAL LOW (ref 4.22–5.81)
RDW: 13.9 % (ref 11.5–15.5)
WBC: 9 K/uL (ref 4.0–10.5)
nRBC: 0 % (ref 0.0–0.2)

## 2024-08-31 MED ORDER — INSULIN ASPART 100 UNIT/ML IJ SOLN
11.0000 [IU] | Freq: Three times a day (TID) | INTRAMUSCULAR | Status: DC
  Administered 2024-08-31 – 2024-09-01 (×3): 11 [IU] via SUBCUTANEOUS
  Filled 2024-08-31 (×3): qty 11

## 2024-08-31 NOTE — Progress Notes (Signed)
 [name], male   DOB: 02/10/1971, 53 y.o.   MRN: [id number]  18x18 wheelchair and transfer board ordered via [employer].

## 2024-08-31 NOTE — Patient Care Conference (Cosign Needed)
 Inpatient RehabilitationTeam Conference and Plan of Care Update Date: 08/30/2024   Time: 11:46 AM    Patient Name: Sean Carter      Medical Record Number: [id number]  Date of Birth: June 23, 1971 Sex: Male         Room/Bed: 4M08C/4M08C-01 Payor Info: Payor: [employer] / Plan: [employer] [facility]  / Product Type: *No Product type* /    Admit Date/Time:  08/24/2024  5:44 PM  Primary Diagnosis:  Below-knee amputation of left lower extremity [facility])  Hospital Problems: Principal Problem:   Below-knee amputation of left lower extremity ([name]) Active Problems:   S/P BKA (below knee amputation) unilateral, right ([name])   Coping style affecting medical condition    Expected Discharge Date: Expected Discharge Date: 09/04/24  Team Members Present: Physician leading conference: Dr. Sven Elks Social Worker Present: Waverly Gentry, LCSW-A Nurse Present: Barnie Ronde, RN PT Present: Therisa Stains, PT OT Present: Vera Pop, OT PPS Coordinator present : Eleanor Colon, SLP     Current Status/Progress Goal Weekly Team Focus  Bowel/Bladder   continent of bowel and bladder       assist with mobility to bathroom    Swallow/Nutrition/ Hydration               ADL's   set-up UB/LB, Min A toileting   Mod-I   barriers: toileting transfers/hygiene, limb loss education, prosthetic    Mobility   bed mobility mod I, AP/PA transfers close supervision, toilet transfers min A   supervision/mod I  barriers: wound on R residual limb, limb loss education, standing balance/coordination    Communication                Safety/Cognition/ Behavioral Observations               Pain   Pain is moderate with patient giving a score of 5 on the scale.   Keep patient pain between low and mooderate to encourge healing and ambulation.   Continue to monitor pain levels and encourage ambulation    Skin   Bilateral BKA wounds.   wound care on bilateral LE  Focus in wound care for BLE.       Discharge Planning:  Home with s/o. OP referral. DAC and 18x18 wheelchair recommended.   Team Discussion: Patient admitted post left BKA with history of diabetes and right knee wound on right BKA residual limb.  Patient on target to meet rehab goals: yes, currently needs mod I assist for bed mobility.  Needs set up for ADLs and min assist for toileting. Needs close supervision for AP transfers and min assist for toilet transfers.   *See Care Plan and progress notes for long and short-term goals.   Revisions to Treatment Plan:  Changing IV abx to Amoxicillin  CT scan of spine to r/o diskitis; declined due to claustrophobia   Teaching Needs: Safety, medications, dietary modification, skin care/wound care, transfers, toileting, etc.   Current Barriers to Discharge: Decreased caregiver support and Wound care  Possible Resolutions to Barriers: Family education OP follow up services DME: Wheelchair     Medical Summary Current Status: type 2 diabetes, bacteremia  Barriers to Discharge: Medical stability  Barriers to Discharge Comments: type 2 diabetes, bacteremia Possible Resolutions to [employer] Focus: continue long acting insulin , continue sliding scale insulin , increase novolog  to 11U TID, continue IV penicillin    Continued Need for Acute Rehabilitation Level of Care: The patient requires daily medical management by a physician with specialized training in physical medicine  and rehabilitation for the following reasons: Direction of a multidisciplinary physical rehabilitation program to maximize functional independence : Yes Medical management of patient stability for increased activity during participation in an intensive rehabilitation regime.: Yes Analysis of laboratory values and/or radiology reports with any subsequent need for medication adjustment and/or medical intervention. : Yes   I attest that I was present, lead the team conference, and concur with the  assessment and plan of the team.   Fredericka Sober B 09/01/2024, 8:30 AM

## 2024-08-31 NOTE — Plan of Care (Signed)
  Problem: Consults Goal: RH LIMB LOSS PATIENT EDUCATION Description: Description: See Patient Education module for eduction specifics. Outcome: Progressing Goal: Nutrition Consult-if indicated Outcome: Progressing Goal: Diabetes Guidelines if Diabetic/Glucose > 140 Description: If diabetic or lab glucose is > 140 mg/dl - Initiate Diabetes/Hyperglycemia Guidelines & Document Interventions  Outcome: Progressing   Problem: RH BOWEL ELIMINATION Goal: RH STG MANAGE BOWEL WITH ASSISTANCE Description: STG Manage Bowel with mod I Assistance. Outcome: Progressing Goal: RH STG MANAGE BOWEL W/MEDICATION W/ASSISTANCE Description: STG Manage Bowel with Medication with Assistance. Outcome: Progressing   Problem: RH SAFETY Goal: RH STG ADHERE TO SAFETY PRECAUTIONS W/ASSISTANCE/DEVICE Description: STG Adhere to Safety Precautions With cues Assistance/Device. Outcome: Progressing   Problem: RH PAIN MANAGEMENT Goal: RH STG PAIN MANAGED AT OR BELOW PT'S PAIN GOAL Description: < 4 with prns Outcome: Progressing   Problem: RH KNOWLEDGE DEFICIT LIMB LOSS Goal: RH STG INCREASE KNOWLEDGE OF SELF CARE AFTER LIMB LOSS Description: Patient will be able to manage care at discharge using educational resources for medication and dietary modification independently Outcome: Progressing

## 2024-08-31 NOTE — Progress Notes (Signed)
 Physical Therapy Session Note  Patient Details  Name: Sean Carter MRN: [id number] Date of Birth: 02-22-1971  Today's Date: 08/31/2024 PT Individual Time: 9083-8984 and 1302-1400 PT Individual Time Calculation (min): 59 min and 58 min  Short Term Goals: Week 1:  PT Short Term Goal 1 (Week 1): STG = LTG d/t ELOS  Skilled Therapeutic Interventions/Progress Updates:   Treatment Session 1 Received pt semi-reclined, pt agreeable to PT treatment, and reported pain 1/10 in L residual limb (premedicated). Session with emphasis on functional mobility/transfers, generalized strengthening and endurance, dynamic sitting balance/coordination, and limb loss education. Removed 4XL shrinker from RLE and donned 2XL shrinker to RLE (pt wearing 3XL on LLE). Hand washed dirty shrinkers and laid to air dry while educating pt on shrinker wear/care guidelines. Pt transferred semi-reclined<>sitting EOB with HOB elevated and use of bedrails mod I. Performed AP transfer into [facility] with supervision and assist to stabilize WC while RN administered medication.    Pt performed WC mobility 52ft using BUE and supervision/mod I to ortho gym with emphasis on UE strength/coordination. Discussed simulated car transfer and pt opting to enter via lateral scoot with heavy reliance on grab bar. Pt with difficulty clearing buttocks and attempting to pull up with BUE support on grab bar - encouraged trying slideboard for safety and pt performed simulated car transfer with CGA using slideboard. Pt reported using slideboard was much easier and would be easier for his wife - notified CSW of order request. Performed WC mobility 142ft using BUE and mod I to main therapy gym and performed AP/PA transfer on/off mat with supervision. Pt continues to struggle with donning/doffing legrests and needs continued practice.   Transitioned into prone and performed the following exercises with emphasis on LE strength/ROM: -prone<>prone on extended elbows  10x5 second hold -knee flexion 2x10 bilaterally -hip extension 2x10 bilaterally  Transitioned to sitting EOM and worked on dynamic sitting balance, reaching outside BOS, and core strength clipping clothespins to clothesline while sitting on wobble disc while working on pinch grip strength and fine motor control. Transitioned to oblique activation performing lateral trunk rotations to retreive clothespins on mat then twisting to place onto clothesline  Discussed and provided pt with the following limb loss resources: -Contracture prevention -amputee support group -balance amputee brochure -shrinker care guidelines  Provided pt HEP and educated on frequency/duration/technique for the following exercises: -WC push ups 3x10 -sidelying hip and knee AROM 3x10 -SAQ 3x10 -prone hip extension 3x10 -supine hip flexion 3x10 -supine hamstring stretc 3x30 seconds  -supine hip abduction 3x10 -prone press ups 10x10 second hold -sidelying hip abduction 3x10 -assisted bridge 3x10  Transported back to room in [facility] dependently and agreed to remain sitting up. Concluded session with pt sitting in [facility] with all needs within reach.   Treatment Session 2 Received pt sitting in WC with NT assessing vitals. Pt agreeable to PT treatment and reported pain 4/10 in L residual limb (premedicated). Session with emphasis on functional mobility/transfers, generalized strengthening and endurance, dynamic sitting balance/coordination, and limb loss education. Educated pt on desensitization techniques (rubbing, massage, tapping) for pain relief. Pt performed WC mobility 155ft using BUE and supervision/mod I to main therapy gym with emphasis on UE strength/coordination. Pt performed PA/AP transfer on/off mat with supervision and assist to stabilize WC. Transitioned into sidelying and performed sidelying hip abduction 2x10 and sidelying hip extension 2x10 with 1.5lb ankle weight progressing to 2.5lb ankle weight with emphasis on  hip abductor strength.   Sean Carter from [place] present to inspect  prosthetic. Transitioned to sitting EOM and attempted to don prosthetic with shrinker, gel liner, blue sock, and green sock but unable to get pin to click in. Sean Carter took prosthetic to make the following adjustments: -switching to high activity foot with transverse rotation -lowering height of leg to improve stability -getting 9mm thick liner with extra padding in socket to take up space and protect wound Discussed importance of wearing shrinker over wound until it is completley healed. PA arrived during session. Transported back to room and performed AP transfer into bed. Concluded session with pt semi-reclined in bed, needs within reach, and bed alarm on using urinal.   Of note, Pt would benefit from new socket as current socket is too big. Even with maximal amount of socks, pt pistoning in socket and feels tendency to fall backwards in standing. Current socket also rubs on anterior distal tibia and has created wound which has impacted pt's ability to wear prosthetic and thus limited his mobility. Pt's current socket poses huge safety/fall risk as well as risk for skin breakdown.   Therapy Documentation Precautions:  Precautions Precautions: Fall Recall of Precautions/Restrictions: Intact Precaution/Restrictions Comments: healing wound on distal end of residual limb Required Braces or Orthoses: Other Brace Other Brace: L limb protector Restrictions Weight Bearing Restrictions Per Provider Order: Yes LLE Weight Bearing Per Provider Order: Non weight bearing   Therapy/Group: Individual Therapy Sean Carter Sean Carter PT, DPT 08/31/2024, 6:59 AM

## 2024-08-31 NOTE — Evaluation (Signed)
 Recreational Therapy Assessment and Plan  Patient Details  Name: Sean Carter MRN: [id number] Date of Birth: May 24, 1971 Today's Date: 08/31/2024  Rehab Potential:  Good ELOS:   d/c 11/16  Assessment Hospital Problem: Principal Problem:   Below-knee amputation of left lower extremity ([name]) Active Problems:   S/P BKA (below knee amputation) unilateral, right [facility])     Past Medical History:      Past Medical History:  Diagnosis Date   Blurry vision 07/10/2020   Diabetes mellitus without complication ([name])     Gangrene of toe of left foot ([name])     Peripheral arterial disease     Sepsis due to Streptococcus, group B ([name]) 06/20/2020   Streptococcal bacteremia          Past Surgical History:       Past Surgical History:  Procedure Laterality Date   ABDOMINAL AORTOGRAM W/LOWER EXTREMITY N/A 01/18/2023    Procedure: ABDOMINAL AORTOGRAM W/LOWER EXTREMITY;  Surgeon: Eliza Lonni RAMAN, MD;  Location: [facility] INVASIVE CV LAB;  Service: Cardiovascular;  Laterality: N/A;   AMPUTATION Left 05/31/2020    Procedure: Ray Amputation of Left Great Toe with Negative Pressure Vac Placement;  Surgeon: Gretta Lonni PARAS, MD;  Location: [facility] OR;  Service: Vascular;  Laterality: Left;   AMPUTATION Right 07/22/2022    Procedure: RIGHT FIRST AND SECOND RAY PARTIAL  AMPUTATION;  Surgeon: Magda Debby SAILOR, MD;  Location: [facility] OR;  Service: Vascular;  Laterality: Right;   AMPUTATION Right 09/02/2023    Procedure: AMPUTATION BELOW KNEE;  Surgeon: Serene Gaile ORN, MD;  Location: [facility] OR;  Service: Vascular;  Laterality: Right;   AMPUTATION Left 08/22/2024    Procedure: LEFT AMPUTATION BELOW KNEE;  Surgeon: Sheree Penne Lonni, MD;  Location: [facility] OR;  Service: Vascular;  Laterality: Left;   APPLICATION OF WOUND VAC Right 01/22/2023    Procedure: APPLICATION OF WOUND VAC, RIGHT FOOT;  Surgeon: Sheree Penne Lonni, MD;  Location: [facility] OR;  Service: Vascular;  Laterality: Right;   INCISION AND DRAINAGE OF  WOUND Right 01/22/2023    Procedure: WASHOUT OF RIGHT FOOT WOUND;  Surgeon: Sheree Penne Lonni, MD;  Location: [facility] OR;  Service: Vascular;  Laterality: Right;   PERIPHERAL VASCULAR BALLOON ANGIOPLASTY   01/18/2023    Procedure: PERIPHERAL VASCULAR BALLOON ANGIOPLASTY;  Surgeon: Eliza Lonni RAMAN, MD;  Location: [facility] INVASIVE CV LAB;  Service: Cardiovascular;;   STUMP REVISION Right 01/22/2023    Procedure: PARTIAL CLOSURE OF RIGHT FOOT WOUND;  Surgeon: Sheree Penne Lonni, MD;  Location: [facility] OR;  Service: Vascular;  Laterality: Right;   TRANSMETATARSAL AMPUTATION Right 01/19/2023    Procedure: TRANSMETATARSAL AMPUTATION;  Surgeon: Sheree Penne Lonni, MD;  Location: [facility] OR;  Service: Vascular;  Laterality: Right;          Assessment & Plan Clinical Impression: Sean Carter is a 53 year old male with history of T2DM (A1c 6.5) with diabetic retinopathy, PAD s/p multiple R-BKA 08/2023 after multiple procedures and developed left foot pain for 3 weeks progressing to fever and chills 2-3 days PTA on 08/21/24. He was found to have gangrenous changes of most of his foot with malodor which was felt to be non-salvageable. He was started on IV Vanc/Zosyn  and underwent L-BKA on 11/04 by Dr. Sheree. Blood cultures done showed Group B strep and antibiotics changed to PCN with recommendations to rule out diskitis/OM of spine and TTE. He was unable to tolerate MRI (and refuses to get it done under anesthesia) and 2D echo  done revealing EF 55-60% and mild thickening of AV/MV. Leucocytosis resolving with WBC down form 19.7-->10.9 and ABLA noted. PT/OT consulted and patient requires CGA for ADLs and mobility. He was working PTA and CIR recommended due to functional decline.  Patient transferred to CIR on 08/24/2024 .    Met with pt today to discuss TR services including leisure education, activity analysis/modifications and stress management.  Also discussed the importance of social, emotional, spiritual  health in addition to physical health and their effects on overall health and wellness.  Pt is extremely motivated to return to previously enjoyed activities and is confident he'll be back to his normal activities very soon.   Plan   No further TR Recommendations for other services: None   Discharge Criteria: Patient will be discharged from TR if patient refuses treatment 3 consecutive times without medical reason.  If treatment goals not met, if there is a change in medical status, if patient makes no progress towards goals or if patient is discharged from hospital.  The above assessment, treatment plan, treatment alternatives and goals were discussed and mutually agreed upon: by patient  Sean Carter 08/31/2024, 1:20 PM

## 2024-08-31 NOTE — Progress Notes (Signed)
 Occupational Therapy Session Note  Patient Details  Name: Sean Carter MRN: [id number] Date of Birth: [birth year]  Session 1 Today's Date: 08/31/2024 OT Individual Time: 9199-9149 OT Individual Time Calculation (min): 50 min  Session 2 Today's Date: 08/31/2024 OT Individual Time: 9199-9149 OT Individual Time Calculation (min): 50 min    Short Term Goals: Week 1:  OT Short Term Goal 1 (Week 1): STG=LTG d/t ELOS  Skilled Therapeutic Interventions/Progress Updates:  Session 1: Skilled OT session completed to address ADL retraining and limb loss education. Pt received supine in bed, agreeable to participate in therapy. Pt reports no pain.  Supine>Long sitting Mod-I with HOB elevated.  Bed>WC PA scoot with set-up A to place WC, pt continues to direct care by requesting location of WC for transfer. OT and pt collaborate on WC placement in home to increase independence with transfers. Pt completed sink level ADLs and grooming with set-up A to retrieve bathing materials and clothing. UB bathing/ dressing completed with set-up d/t IV line management. Pt attempts LB dressing seated in WC with supervision, VC to lateral weight shift when donning pants. Pt grows frustrated with increased time for activity, task modified. Pt self-propels to bed, WC>Bed AP scoot transfer with CGA to stabilize WC. Pt dons pants rolling L>R with independence. Pt seated in bed with direct handoff to nursing.  Session 2: Skilled OT session completed to address functional transfers and limb loss education. Pt received supine in bed, agreeable to participate in therapy. Pt reports no pain.  Supine>Long sitting with independence, no use of bed rails. Long sitting>WC with CGA to stabilize WC, pt directed care with WC placement.  OT demos 3 transfer methods to support pt during toileting at [facility]: AP/PA scoots, lateral scoots, and SB transfers. Pt expressing discomfort with AP/PA scoots for toileting with orientation of toilet,  requests to trial lateral scoots. Pt attempted lateral scoot without SB, task discontinued to d/t unsafe WC placement. OT provides A with WC placement, pt begins transfer and requests to discontinue d/t fear of falling. Pt completed 2 trials WC<>DABSC with SB 1st trial: Min A to scoot to DABSC d/t incline, trial 2 CGA, VC to increase trunk flexion to reduce risk for falls during transfer. Pt requests to continue transfers during session to support independence and comfort with transfer.   OT provided education daily skin checks at BLE and BUE, and increased risk for amputations d/t prior amputations. Pt displays carryover from previous sessions as he recalls problem areas to locate during skin checks. OT simulates skin checks with 10 pieces of tape and inspection mirror, pt able to locate 10/10 pieces of tape with VC to adjust inspection mirror. OT reinforces importance of monitoring changes daily. Pt verbalized understanding. Pt self-propels to bed, WC>bed with CGA to stabilize WC. Pt returned to supine, direct handoff to RN with all needs met.  Therapy Documentation Precautions:  Precautions Precautions: Fall Recall of Precautions/Restrictions: Intact Precaution/Restrictions Comments: healing wound on distal end of residual limb Required Braces or Orthoses: Other Brace Other Brace: L limb protector Restrictions Weight Bearing Restrictions Per Provider Order: Yes LLE Weight Bearing Per Provider Order: Non weight bearing   Therapy/Group: Individual Therapy  Ayde Record Woods-Chance, MS, OTR/L 08/31/2024, 7:51 AM

## 2024-08-31 NOTE — Progress Notes (Signed)
 PROGRESS NOTE   Subjective/Complaints: No new complaints this morning CBGs improved Hgb improved K+ improved  MND:Izwpzd CP, SOB, abdominal pain, nausea, vomiting  pain is well controlled, +constipation- improved   Objective:   No results found.   Recent Labs    08/31/24 0514  WBC 9.0  HGB 10.2*  HCT 31.4*  PLT 528*   Recent Labs    08/29/24 0455 08/31/24 0514  NA 132* 132*  K 5.2* 4.4  CL 97* 98  CO2 23 25  GLUCOSE 268* 238*  BUN 22* 25*  CREATININE 0.94 0.90  CALCIUM  8.6* 8.9    Intake/Output Summary (Last 24 hours) at 08/31/2024 0947 Last data filed at 08/31/2024 0925 Gross per 24 hour  Intake 1801.12 ml  Output 3750 ml  Net -1948.88 ml        Physical Exam: Vital Signs Blood pressure 139/76, pulse 61, temperature 97.8 F (36.6 C), resp. rate 17, height 5' 8 (1.727 m), weight 93.1 kg, SpO2 95%.   Gen: no distress, normal appearing HEENT: oral mucosa pink and moist, NCAT Cardio: RRR Chest: CTAB, normal effort, normal rate of breathing Abdominal:     Palpations: Abdomen is soft.     Comments: Very protuberant; non distended; normoactive; Soft  Musculoskeletal:     Cervical back: Neck supple. No tenderness.     Comments: Ue's 5/5 throughout B/L RLE_ HF/KE/KF 5/5- has R BKA- with scabs on R BKA from standing 9-12 hours/day at work LLE- HF/KE/KF 5/5- bulbous L BKA, stable 11/13 Skin:    General: Skin is warm.     Comments: A couple of open and abraded areas on R-BKA. L-BKA with dry dressing.  Per last pic- has oozing from incision- dog ears (+) and bulbous L Forearm IV- not leaking anymore- constantly on IV Pen G  Neurological:     Mental Status: He is alert and oriented to person, place, and time.     Comments: Intact to light touch in all 4 extremities Ox3- keeps eyes closed due to fuzziness  Psychiatric:        Mood and Affect: Mood normal.        Behavior: Behavior normal.     Assessment/Plan: 1. Functional deficits which require 3+ hours per day of interdisciplinary therapy in a comprehensive inpatient rehab setting. Physiatrist is providing close team supervision and 24 hour management of active medical problems listed below. Physiatrist and rehab team continue to assess barriers to discharge/monitor patient progress toward functional and medical goals  Care Tool:  Bathing    Body parts bathed by patient: Right arm, Left arm, Chest, Abdomen, Front perineal area, Buttocks, Right upper leg, Left upper leg, Face     Body parts n/a: Left lower leg, Right lower leg   Bathing assist Assist Level: Moderate Assistance - Patient 50 - 74%     Upper Body Dressing/Undressing Upper body dressing   What is the patient wearing?: Hospital gown only    Upper body assist Assist Level: Minimal Assistance - Patient > 75%    Lower Body Dressing/Undressing Lower body dressing      What is the patient wearing?: Ace wrap/stump shrinker, Orthosis  Lower body assist Assist for lower body dressing: Moderate Assistance - Patient 50 - 74%     Toileting Toileting    Toileting assist Assist for toileting: Moderate Assistance - Patient 50 - 74%     Transfers Chair/bed transfer  Transfers assist     Chair/bed transfer assist level: Supervision/Verbal cueing     Locomotion Ambulation   Ambulation assist   Ambulation activity did not occur: Safety/medical concerns (old R BKA, new L BKA)          Walk 10 feet activity   Assist  Walk 10 feet activity did not occur: Safety/medical concerns (old R BKA, new L BKA)        Walk 50 feet activity   Assist Walk 50 feet with 2 turns activity did not occur: Safety/medical concerns (old R BKA, new L BKA)         Walk 150 feet activity   Assist Walk 150 feet activity did not occur: Safety/medical concerns (old R BKA, new L BKA)         Walk 10 feet on uneven surface  activity   Assist  Walk 10 feet on uneven surfaces activity did not occur: Safety/medical concerns (old R BKA, new L BKA)         Wheelchair     Assist Is the patient using a wheelchair?: Yes Type of Wheelchair: Manual    Wheelchair assist level: Independent Max wheelchair distance: 150'    Wheelchair 50 feet with 2 turns activity    Assist        Assist Level: Independent   Wheelchair 150 feet activity     Assist      Assist Level: Independent   Blood pressure 139/76, pulse 61, temperature 97.8 F (36.6 C), resp. rate 17, height 5' 8 (1.727 m), weight 93.1 kg, SpO2 95%.  Medical Problem List and Plan: 1. Functional deficits secondary to L BKA in setting of old R BKA due to osteomyelitis and gangrene             -patient may not shower             -ELOS/Goals: 7-10 days supervision to mod I at w/c level Chart and therapy notes reviewed, continue CIR, discussed patient's progress with therapy Grounds pass ordered Vitamin D3/Metanx/Vitamin B/C complex ordered F/u with me or Fidela in clinic within 1 month Would benefit from home health aide upon discharge  Continue CIR PT, OT  2.  Antithrombotics: -DVT/anticoagulation:  Pharmaceutical: continue Lovenox              -antiplatelet therapy: ASA  3. Back pain: CT spine ordered to assess for discitis. Hydrocodone--->changed to tramadol  prn, continue             --Ketorolac  X 5 days thru thru 11/08. Monitor renal status/BP stopped Norco due to LFT elevation  4. Mood/Behavior/Sleep: LCSW to follow for evaluation and support.              -antipsychotic agents: N/A  5. Neuropsych/cognition: This patient is capable of making decisions on his own behalf.  6. Skin/Wound Care: Routine pressure relief measures.  --Low carb protein supplements for wound healing.   7. Fluids/Electrolytes/Nutrition: Monitor I/O. Check CMET in am  8. Group B strep bactermia: Antibiotics changed to  PCN G             --MRI TL spine limited by  claustrophobia.             --2  d echo OK  Continue IV Penicillin , can switch to amoxicillin  upon d/c as per ID  9. T2DM: Hgb A1c 6.5 and well controlled on Insulin  glargine 40 units w/20 units lispro bid.  --Will monitor BS ac/hs and use SSI for elevated BS             --Increase basal insulin  to 30 units. D/c feeding supplement. Acarbose added-increase to 50mg  TID  Increase semglee  to 40U HS, increase novolog  to 11U with meals  CBG (last 3)  Recent Labs    08/30/24 1649 08/30/24 2134 08/31/24 0630  GLUCAP 200* 208* 204*   10.  HTN: Monitor BP TID--continue amlodipine  10mg  daily, acarbose added as above for diabetes and can also help HTN      08/31/2024    5:05 AM 08/30/2024    8:50 PM 08/30/2024    1:51 PM  Vitals with BMI  Systolic 139 126 870  Diastolic 76 66 58  Pulse 61 66 64     11. Abnormal LFTs: reviewed and continue to uptrend, d/c statin until they start to come down, repeat tomorrow.   - 11/10 alk phos, AST and ALT improved today, continue current regimen  12. ABLA: Hgb reviewed and is trending upward  13. Acute renal failure: Due to infection and has resolved.  Cr reviewed and is stable  14.  Hyponatremia/hyperkalemia  -lokelma  today, K+ reviewed and improved on repeat   LOS: 7 days A FACE TO FACE EVALUATION WAS PERFORMED  Sven SQUIBB Archer Vise 08/31/2024, 9:47 AM

## 2024-09-01 ENCOUNTER — Other Ambulatory Visit (HOSPITAL_COMMUNITY): Payer: Self-pay

## 2024-09-01 DIAGNOSIS — S88112A Complete traumatic amputation at level between knee and ankle, left lower leg, initial encounter: Secondary | ICD-10-CM | POA: Diagnosis not present

## 2024-09-01 LAB — GLUCOSE, CAPILLARY
Glucose-Capillary: 146 mg/dL — ABNORMAL HIGH (ref 70–99)
Glucose-Capillary: 108 mg/dL — ABNORMAL HIGH (ref 70–99)
Glucose-Capillary: 129 mg/dL — ABNORMAL HIGH (ref 70–99)
Glucose-Capillary: 166 mg/dL — ABNORMAL HIGH (ref 70–99)

## 2024-09-01 MED ORDER — INSULIN ASPART 100 UNIT/ML FLEXPEN
12.0000 [IU] | PEN_INJECTOR | Freq: Three times a day (TID) | SUBCUTANEOUS | 11 refills | Status: DC
  Filled 2024-09-01: qty 15, fill #0

## 2024-09-01 MED ORDER — JUVEN PO PACK: 1.0000 | PACK | Freq: Two times a day (BID) | ORAL | Status: DC

## 2024-09-01 MED ORDER — INSULIN PEN NEEDLE 32G X 4 MM MISC
1.0000 | Freq: Three times a day (TID) | 0 refills | Status: DC
  Filled 2024-09-01: qty 100, 30d supply, fill #0

## 2024-09-01 MED ORDER — VITAMIN D3 25 MCG PO TABS
2000.0000 [IU] | ORAL_TABLET | Freq: Every day | ORAL | 0 refills | Status: AC
  Filled 2024-09-01: qty 60, 30d supply, fill #0

## 2024-09-01 MED ORDER — B COMPLEX-C PO TABS
1.0000 | ORAL_TABLET | Freq: Every day | ORAL | 0 refills | Status: AC
  Filled 2024-09-01: qty 30, 30d supply, fill #0

## 2024-09-01 MED ORDER — TRAMADOL HCL 50 MG PO TABS
50.0000 mg | ORAL_TABLET | Freq: Four times a day (QID) | ORAL | 0 refills | Status: AC | PRN
  Filled 2024-09-01: qty 28, 7d supply, fill #0

## 2024-09-01 MED ORDER — LANTUS SOLOSTAR 100 UNIT/ML ~~LOC~~ SOPN
40.0000 [IU] | PEN_INJECTOR | Freq: Every day | SUBCUTANEOUS | 0 refills | Status: DC
  Filled 2024-09-01: qty 12, 30d supply, fill #0

## 2024-09-01 MED ORDER — INSULIN LISPRO (1 UNIT DIAL) 100 UNIT/ML (KWIKPEN)
12.0000 [IU] | PEN_INJECTOR | Freq: Three times a day (TID) | SUBCUTANEOUS | 11 refills | Status: DC
  Filled 2024-09-01: qty 15, 42d supply, fill #0

## 2024-09-01 MED ORDER — AMOXICILLIN 500 MG PO CAPS
1000.0000 mg | ORAL_CAPSULE | Freq: Three times a day (TID) | ORAL | 0 refills | Status: DC
  Filled 2024-09-01: qty 22, 4d supply, fill #0

## 2024-09-01 MED ORDER — FAMOTIDINE 40 MG PO TABS
40.0000 mg | ORAL_TABLET | Freq: Two times a day (BID) | ORAL | 0 refills | Status: DC
  Filled 2024-09-01: qty 60, 30d supply, fill #0

## 2024-09-01 MED ORDER — BASAGLAR KWIKPEN 100 UNIT/ML ~~LOC~~ SOPN
40.0000 [IU] | PEN_INJECTOR | Freq: Every day | SUBCUTANEOUS | 0 refills | Status: DC
  Filled 2024-09-01: qty 15, 37d supply, fill #0

## 2024-09-01 MED ORDER — ACARBOSE 50 MG PO TABS
50.0000 mg | ORAL_TABLET | Freq: Three times a day (TID) | ORAL | 0 refills | Status: AC
Start: 2024-09-01 — End: ?
  Filled 2024-09-01: qty 90, 30d supply, fill #0

## 2024-09-01 MED ORDER — INSULIN GLARGINE 100 UNIT/ML SOLOSTAR PEN
40.0000 [IU] | PEN_INJECTOR | Freq: Every day | SUBCUTANEOUS | 11 refills | Status: DC
  Filled 2024-09-01: qty 15, 37d supply, fill #0

## 2024-09-01 MED ORDER — L-METHYLFOLATE-B6-B12 3-35-2 MG PO TABS
1.0000 | ORAL_TABLET | Freq: Every day | ORAL | 0 refills | Status: AC
  Filled 2024-09-01: qty 30, 30d supply, fill #0

## 2024-09-01 MED ORDER — INSULIN ASPART 100 UNIT/ML IJ SOLN
12.0000 [IU] | Freq: Three times a day (TID) | INTRAMUSCULAR | Status: DC
  Administered 2024-09-01 – 2024-09-04 (×10): 12 [IU] via SUBCUTANEOUS
  Filled 2024-09-01 (×10): qty 12

## 2024-09-01 NOTE — Progress Notes (Signed)
 Physical Therapy Session Note  Patient Details  Name: Sean Carter MRN: [id number] Date of Birth: 12-Aug-1971  Today's Date: 09/01/2024 PT Individual Time: [phone] and [id number] PT Individual Time Calculation (min): 56 min and 72 min  Short Term Goals: Week 1:  PT Short Term Goal 1 (Week 1): STG = LTG d/t ELOS  Skilled Therapeutic Interventions/Progress Updates:   Treatment Session 1 Received pt semi-reclined in bed, pt agreeable to PT treatment, and did not state pain level during session. Session with emphasis on D/C planning, functional mobility/transfers, generalized strengthening and endurance, dynamic sitting balance/coordination, and limb loss education. Removed shrinkers to inspect limbs - R tibia wound appears to be healing and incision on L residual limb is dry and intact (noted mild redness on lateral distal aspect of limb). Donned 2XL shrinkers and L limb guard with setup assist. Pt performed bed mobility mod I using bed features and performed PA transfer into WC with supervision with assist to stabilize WC.   Went through sensation, MMT, and pain interference questionnaire in preparation for discharge. Pt sat in WC at sink and washed shrinkers mod I. Pt performed WC mobility 51ft x 2 trials using BUE and mod I to/from ortho gym with emphasis on UE strength/coordination. Pt requesting medical note from MD for work (MD notified) and reports already having handicap parking pass. Pt performed simulated car transfer slightly uphill using slideboard and close supervision with assist to place/remove board (pt able to direct therapist in transfer setup). Pt performed seated BUE strengthening on UBE on pace partner at level 3 for 5 minutes with emphasis on UE strength and endurance. Pt then performed seated WC pushups 2x10 with emphasis on UE strength. Returned to room and concluded session with pt sitting in [facility] with all needs within reach awaiting upcoming OT session.    Treatment Session  2 Received pt semi-reclined in bed with NT checking vitals. Pt very upset with [employer] about copayment for [facility] - CSW notified and discussed with pt during session. Session with emphasis on functional mobility/transfers, generalized strengthening and endurance, dynamic sitting/standing balance/coordination, and limb loss education. Donned limb guard with setup assist and pt performed bed mobility mod I using bed features and all AP/PA transfers with supervision throughout session.   Pt performed WC mobility 171ft x 2 trials using BUE and mod I to/from main therapy gym with emphasis on UE strength/coordination. Transferred onto mat and performed the following exercises with emphasis on core/UE/LE strength/ROM: -seated lateral trunk rotation thrusts with tidal tank 2x1 minute -supine shoulder extensions into physioball 10x10 second hold  -supine alternating leg lowers 2x8 bilaterally   Chris from [employer] arrived with adjusted prosthetic. Discussed wearing shrinker under gel liner with 3 ply socks. Also discussed wear schedule with prosthetic and using prosthetic for transfers only until pt can get outpatient appointment for new socket. Stood from [facility] with RW and min A and required CGA for standing balance while Chris adjusted leg. Worked on blocked practice stand<>pivot transfers without RW x 5 trials with min A fading to CGA with min cues fading to supervision. Pt then performed stand<>pivot transfer with RW and min A (pt required x 2 attempts) and increased difficulty pivoting on foot. Talked through technique for toilet transfers in pt's small bathroom - pt plans on getting WC close to doorway, holding onto vanity, and pivoting onto commode.   Returned to room and performed stand<>pivot transfer back to bed with CGA. Concluded session with pt sitting EOB, needs within reach, and  bed alarm on. CSW at bedside and pt calling [employer].    Therapy Documentation Precautions:  Precautions Precautions: Fall Recall  of Precautions/Restrictions: Intact Precaution/Restrictions Comments: healing wound on distal end of residual limb Required Braces or Orthoses: Other Brace Other Brace: L limb protector Restrictions Weight Bearing Restrictions Per Provider Order: Yes LLE Weight Bearing Per Provider Order: Non weight bearing  Therapy/Group: Individual Therapy Therisa HERO Zaunegger Therisa Stains PT, DPT 09/01/2024, 6:51 AM

## 2024-09-01 NOTE — Progress Notes (Signed)
 Physical Therapy Discharge Summary  Patient Details  Name: Sean Carter MRN: [id number] Date of Birth: 1971-06-26  Date of Discharge from PT service:September 03, 2024  Patient has met 7 of 7 long term goals due to improved activity tolerance, improved balance, improved postural control, increased strength, increased range of motion, decreased pain, ability to compensate for deficits, improved awareness, and improved coordination. Patient to discharge at a wheelchair level Supervision/Mod I. Patient's care partner is independent to provide the necessary physical assistance at discharge.  All goals met  Recommendation:  Patient will benefit from ongoing skilled PT services in outpatient setting to continue to advance safe functional mobility, address ongoing impairments in transfers, generalized strengthening and endurance, dynamic standing balance/coordination, gait training, limb loss education, and to minimize fall risk.  Equipment: 18x18 manual WC with bilateral elevating legrests, slideboard  Reasons for discharge: treatment goals met  Patient/family agrees with progress made and goals achieved: Yes  PT Discharge Precautions/Restrictions Precautions Precautions: Fall Precaution/Restrictions Comments: wound on distal end of R residual limb. Improperly fitting prosthetic. Required Braces or Orthoses: Other Brace Other Brace: L limb protector Restrictions Weight Bearing Restrictions Per Provider Order: Yes LLE Weight Bearing Per Provider Order: Non weight bearing Pain Interference Pain Interference Pain Effect on Sleep: 1. Rarely or not at all Pain Interference with Therapy Activities: 1. Rarely or not at all Pain Interference with Day-to-Day Activities: 1. Rarely or not at all Cognition Overall Cognitive Status: Within Functional Limits for tasks assessed Arousal/Alertness: Awake/alert Orientation Level: Oriented X4 Memory: Appears intact Awareness: Appears intact Problem  Solving: Appears intact Safety/Judgment: Appears intact Comments: stubborn and has his own way of doing things Sensation Sensation Light Touch: Appears Intact Hot/Cold: Not tested Proprioception: Appears Intact Stereognosis: Not tested Additional Comments: pt reports squeezing and phantom sensation in L residaul limb Coordination Gross Motor Movements are Fluid and Coordinated: No Fine Motor Movements are Fluid and Coordinated: Yes Coordination and Movement Description: grossly uncoordinated due to bilateral BKA and wound on R residual limb, impacting ability to wear prosthetic Finger Nose Finger Test: [facility] Heel Shin Test: unable to perform due to bilateral BKA Motor  Motor Motor: Abnormal postural alignment and control Motor - Skilled Clinical Observations: grossly uncoordinated due to bilateral BKA and wound on R residual limb, impacting ability to wear prosthetic  Mobility Bed Mobility Bed Mobility: Rolling Right;Rolling Left;Supine to Sit;Sit to Supine Rolling Right: Independent with assistive device Rolling Left: Independent with assistive device Supine to Sit: Independent with assistive device Sit to Supine: Independent with assistive device Transfers Transfers: Sit to Stand;Stand to Sit;Stand Pivot Transfers;Squat Pivot Transfers;Anterior-Posterior Transfer Sit to Stand: Supervision/Verbal cueing Stand to Sit: Supervision/Verbal cueing Stand Pivot Transfers: Minimal Assistance - Patient > 75% Squat Pivot Transfers: Supervision/Verbal cueing Anterior-Posterior Transfer: Supervision/Verbal cueing Transfer (Assistive device): Rolling walker Locomotion  Gait Ambulation: No Gait Gait: No Stairs / Additional Locomotion Stairs: No Wheelchair Mobility Wheelchair Mobility: Yes Wheelchair Assistance: Independent with Scientist, Research (life Sciences): Both upper extremities Wheelchair Parts Management: Supervision/cueing Distance: >111ft  Trunk/Postural Assessment   Cervical Assessment Cervical Assessment: Within Functional Limits Thoracic Assessment Thoracic Assessment: Within Functional Limits Lumbar Assessment Lumbar Assessment: Within Functional Limits Postural Control Postural Control: Within Functional Limits Postural Limitations: in sitting; not assessed in standing  Balance Balance Balance Assessed: Yes Static Sitting Balance Static Sitting - Balance Support: Bilateral upper extremity supported;Feet unsupported Static Sitting - Level of Assistance: 7: Independent Dynamic Sitting Balance Dynamic Sitting - Balance Support: No upper extremity supported;Feet unsupported Dynamic Sitting -  Level of Assistance: 6: Modified independent (Device/Increase time) Extremity Assessment  RLE Assessment RLE Assessment: Exceptions to [facility] General Strength Comments: tested sitting EOB RLE Strength Right Hip Flexion: 4+/5 Right Hip ABduction: 5/5 Right Hip ADduction: 5/5 Right Knee Flexion: 5/5 LLE Assessment LLE Assessment: Exceptions to [facility] General Strength Comments: tested sitting EOB LLE Strength Left Hip Flexion: 4+/5 Left Hip ABduction: 5/5 Left Hip ADduction: 5/5 Left Knee Flexion: 5/5   Therisa HERO Zaunegger Therisa Stains PT, DTaP Doreene Orris, PT, DPT  09/01/2024, 7:15 AM

## 2024-09-01 NOTE — Plan of Care (Signed)
  Problem: Consults Goal: RH LIMB LOSS PATIENT EDUCATION Description: Description: See Patient Education module for eduction specifics. Outcome: Progressing Goal: Nutrition Consult-if indicated Outcome: Progressing Goal: Diabetes Guidelines if Diabetic/Glucose > 140 Description: If diabetic or lab glucose is > 140 mg/dl - Initiate Diabetes/Hyperglycemia Guidelines & Document Interventions  Outcome: Progressing   Problem: RH BOWEL ELIMINATION Goal: RH STG MANAGE BOWEL WITH ASSISTANCE Description: STG Manage Bowel with mod I Assistance. Outcome: Progressing Goal: RH STG MANAGE BOWEL W/MEDICATION W/ASSISTANCE Description: STG Manage Bowel with Medication with Assistance. Outcome: Progressing   Problem: RH SAFETY Goal: RH STG ADHERE TO SAFETY PRECAUTIONS W/ASSISTANCE/DEVICE Description: STG Adhere to Safety Precautions With cues Assistance/Device. Outcome: Progressing   Problem: RH PAIN MANAGEMENT Goal: RH STG PAIN MANAGED AT OR BELOW PT'S PAIN GOAL Description: < 4 with prns Outcome: Progressing   Problem: RH KNOWLEDGE DEFICIT LIMB LOSS Goal: RH STG INCREASE KNOWLEDGE OF SELF CARE AFTER LIMB LOSS Description: Patient will be able to manage care at discharge using educational resources for medication and dietary modification independently Outcome: Progressing

## 2024-09-01 NOTE — Progress Notes (Signed)
 PROGRESS NOTE   Subjective/Complaints: No new complaints this morning Discussed improved CBGs,increase novolog  to 11U with meals He asks about status of his infection   ROS: Denies CP, SOB, abdominal pain, nausea, vomiting  pain is well controlled, +constipation- improved   Objective:   No results found.   Recent Labs    08/31/24 0514  WBC 9.0  HGB 10.2*  HCT 31.4*  PLT 528*   Recent Labs    08/31/24 0514  NA 132*  K 4.4  CL 98  CO2 25  GLUCOSE 238*  BUN 25*  CREATININE 0.90  CALCIUM  8.9    Intake/Output Summary (Last 24 hours) at 09/01/2024 1015 Last data filed at 09/01/2024 0757 Gross per 24 hour  Intake 610 ml  Output 3124 ml  Net -2514 ml        Physical Exam: Vital Signs Blood pressure 128/68, pulse 62, temperature 98.6 F (37 C), temperature source Oral, resp. rate 18, height 5' 8 (1.727 m), weight 92.1 kg, SpO2 97%.   Gen: no distress, normal appearing HEENT: oral mucosa pink and moist, NCAT Cardio: RRR Chest: CTAB, normal effort, normal rate of breathing Abdominal:     Palpations: Abdomen is soft.     Comments: Very protuberant; non distended; normoactive; Soft  Musculoskeletal:     Cervical back: Neck supple. No tenderness.     Comments: Ue's 5/5 throughout B/L RLE_ HF/KE/KF 5/5- has R BKA- with scabs on R BKA from standing 9-12 hours/day at work LLE- HF/KE/KF 5/5- bulbous L BKA, stable 11/14 Skin:    General: Skin is warm.     Comments: A couple of open and abraded areas on R-BKA. L-BKA with dry dressing.  Per last pic- has oozing from incision- dog ears (+) and bulbous L Forearm IV- not leaking anymore- constantly on IV Pen G  Neurological:     Mental Status: He is alert and oriented to person, place, and time.     Comments: Intact to light touch in all 4 extremities Ox3- keeps eyes closed due to fuzziness  Psychiatric:        Mood and Affect: Mood normal.         Behavior: Behavior normal.    Assessment/Plan: 1. Functional deficits which require 3+ hours per day of interdisciplinary therapy in a comprehensive inpatient rehab setting. Physiatrist is providing close team supervision and 24 hour management of active medical problems listed below. Physiatrist and rehab team continue to assess barriers to discharge/monitor patient progress toward functional and medical goals  Care Tool:  Bathing    Body parts bathed by patient: Right arm, Left arm, Chest, Abdomen, Front perineal area, Buttocks, Right upper leg, Left upper leg, Face     Body parts n/a: Left lower leg, Right lower leg   Bathing assist Assist Level: Moderate Assistance - Patient 50 - 74%     Upper Body Dressing/Undressing Upper body dressing   What is the patient wearing?: Hospital gown only    Upper body assist Assist Level: Minimal Assistance - Patient > 75%    Lower Body Dressing/Undressing Lower body dressing      What is the patient wearing?: Ace wrap/stump shrinker, Orthosis  Lower body assist Assist for lower body dressing: Moderate Assistance - Patient 50 - 74%     Toileting Toileting    Toileting assist Assist for toileting: Moderate Assistance - Patient 50 - 74%     Transfers Chair/bed transfer  Transfers assist     Chair/bed transfer assist level: Supervision/Verbal cueing     Locomotion Ambulation   Ambulation assist   Ambulation activity did not occur: Safety/medical concerns (old R BKA, new L BKA)          Walk 10 feet activity   Assist  Walk 10 feet activity did not occur: Safety/medical concerns (old R BKA, new L BKA)        Walk 50 feet activity   Assist Walk 50 feet with 2 turns activity did not occur: Safety/medical concerns (old R BKA, new L BKA)         Walk 150 feet activity   Assist Walk 150 feet activity did not occur: Safety/medical concerns (old R BKA, new L BKA)         Walk 10 feet on uneven  surface  activity   Assist Walk 10 feet on uneven surfaces activity did not occur: Safety/medical concerns (old R BKA, new L BKA)         Wheelchair     Assist Is the patient using a wheelchair?: Yes Type of Wheelchair: Manual    Wheelchair assist level: Independent Max wheelchair distance: 150'    Wheelchair 50 feet with 2 turns activity    Assist        Assist Level: Independent   Wheelchair 150 feet activity     Assist      Assist Level: Independent   Blood pressure 128/68, pulse 62, temperature 98.6 F (37 C), temperature source Oral, resp. rate 18, height 5' 8 (1.727 m), weight 92.1 kg, SpO2 97%.  Medical Problem List and Plan: 1. Functional deficits secondary to L BKA in setting of old R BKA due to osteomyelitis and gangrene             -patient may not shower             -ELOS/Goals: 7-10 days supervision to mod I at w/c level Chart and therapy notes reviewed, continue CIR, discussed patient's progress with therapy Grounds pass ordered Vitamin D3/Metanx/Vitamin B/C complex ordered F/u with me or Fidela in clinic within 1 month Would benefit from home health aide upon discharge  Continue CIR PT, OT Will provide note for work  2.  Antithrombotics: -DVT/anticoagulation:  Pharmaceutical: continue Lovenox              -antiplatelet therapy: ASA  3. Back pain: CT spine ordered to assess for discitis. Hydrocodone--->changed to tramadol  prn, continue             --Ketorolac  X 5 days thru thru 11/08. Monitor renal status/BP stopped Norco due to LFT elevation  4. Mood/Behavior/Sleep: LCSW to follow for evaluation and support.              -antipsychotic agents: N/A  5. Neuropsych/cognition: This patient is capable of making decisions on his own behalf.  6. Skin/Wound Care: Routine pressure relief measures.  --Low carb protein supplements for wound healing.   7. Fluids/Electrolytes/Nutrition: Monitor I/O. Check CMET in am  8. Group B strep  bactermia: Antibiotics changed to  PCN G             --MRI TL spine limited by claustrophobia.             --  2 d echo OK  continue IV Penicillin , can switch to amoxicillin  upon d/c as per ID  9. T2DM: Hgb A1c 6.5 and well controlled on Insulin  glargine 40 units w/20 units lispro bid.  --Will monitor BS ac/hs and use SSI for elevated BS             --Increase basal insulin  to 30 units. D/c feeding supplement. Acarbose added-increase to 50mg  TID  Increase semglee  to 40U HS, increase novolog  to 11U with meals  CBG (last 3)  Recent Labs    08/31/24 1650 08/31/24 2108 09/01/24 0529  GLUCAP 144* 189* 146*   10.  HTN: Monitor BP TID--continue amlodipine  10mg  daily, acarbose added as above for diabetes and can also help HTN      09/01/2024    6:00 AM 09/01/2024    5:31 AM 08/31/2024    8:21 PM  Vitals with BMI  Weight  203 lbs 1 oz   BMI  30.88   Systolic 128 129 877  Diastolic 68 71 56  Pulse 62 59 61     11. Abnormal LFTs: reviewed and continue to uptrend, d/c statin until they start to come down, repeat tomorrow.   - 11/10 alk phos, AST and ALT improved today, continue current regimen  12. ABLA: Hgb reviewed and is trending upward  13. Acute renal failure: Due to infection and has resolved.  Cr reviewed and is stable  14.  Hyponatremia/hyperkalemia  -lokelma  today, K+ reviewed and improved on repeat   LOS: 8 days A FACE TO FACE EVALUATION WAS PERFORMED  Ria Redcay P Ripley Lovecchio 09/01/2024, 10:15 AM

## 2024-09-01 NOTE — Progress Notes (Signed)
 [name], male   DOB: April 22, 1971, 53 y.o.   MRN: [id number]  Followed up with patient who reports that he settled things with [employer] and he will get his DME: 18x18 wheelchair and slide board delivered between tomorrow and Sunday prior to discharge.

## 2024-09-01 NOTE — Progress Notes (Signed)
 Occupational Therapy Session Note  Patient Details  Name: Sean Carter MRN: [id number] Date of Birth: 1970-12-23  Today's Date: 09/01/2024 OT Individual Time: 0900-1002 OT Individual Time Calculation (min): 62 min    Short Term Goals: Week 1:  OT Short Term Goal 1 (Week 1): STG=LTG d/t ELOS  Skilled Therapeutic Interventions/Progress Updates:  Skilled OT session completed to address functional mobility and BUE strengthening. Pt received seated in WC, agreeable to participate in therapy. Pt reports no pain.  Pt declined self-care needs at this time. Pt completed self-propelling of WC throughout facility with SBA. Pt able to navigate right and left turns, managing obstacles, and change of surface when propelling to elevator. Pt anticipates difficulty navigating slopes independently; however, reports wife will A if needed. Pt respectfully declines outside slope navigation d/t temperature. Pt returns back to unit with no VC for direction to return and managing WC with SBA. Pt self-propelled to main gym Mod-I. Pt completed WC><EOM SB transfer with CGA with increased slope to simulate toilet transfer. Pt able to place SB for transfer with No A displaying carryover from previous sessions. Pt completes R/L lateral leans to simulate toileting hygiene with CGA for safety. Pt completed the following UB Exs to increase BUE strengthening in support of functional transfers and ADLs: -With Blue theraband   -1 min horizontal abduction  -1 min shoulder flexion  -1 min shoulder extension  -1 min bicep curls  -1 min tricep extensions -With 5lb dumbells   -1 min overhead raise  -1 min chest press  Pt required intermittent rest breaks during activity. Pt self-propelled back to room. WC>Bed AP transfer with SBA. Pt supine in bed with bed alarm on and all needs within reach.  Therapy Documentation Precautions:  Precautions Precautions: Fall Recall of Precautions/Restrictions:  Intact Precaution/Restrictions Comments: wound on distal end of R residual limb. Improperly fitting prosthetic. Required Braces or Orthoses: Other Brace Other Brace: L limb protector Restrictions Weight Bearing Restrictions Per Provider Order: Yes LLE Weight Bearing Per Provider Order: Non weight bearing    Therapy/Group: Individual Therapy Delia Sitar Woods-Chance, MS, OTR/L 09/01/2024, 7:52 AM

## 2024-09-01 NOTE — Progress Notes (Addendum)
 [name], male   DOB: Mar 29, 1971, 53 y.o.   MRN: [id number]  Medical letter signed by provider and provided to patient.   Patient was also updated about DME co-pay of $367.33 with subsequent payments of 35.30 monthly. Patient not happy about this information. SW provided number for patient to contact supplier [phone].   UPDATE:  [employer] reports the reason for the payments is due to deductible not being met.   UPDATE:   1400: Informed patient that [employer] was contacted to inquire about a wheelchair and slide board - they only have a 16x16 wheelchair available. There is no deductible for slide board. Referral order sent. Awaiting correspondence.

## 2024-09-01 NOTE — Discharge Summary (Signed)
 Physician Discharge Summary  [name] MRN: [id number] DOB/AGE: December 06, 1970 53 y.o.  Admit date: 08/24/2024 Discharge date: 09/04/2024  Discharge Diagnoses:  Principal Problem:   Below-knee amputation of left lower extremity ([name]) Active Problems:   Hyponatremia   Type 2 diabetes mellitus ([name])   Normocytic anemia   Wound infection   S/P BKA (below knee amputation) unilateral, right ([name])   Coping style affecting medical condition   Prerenal azotemia   Discharged Condition: stable  Significant Diagnostic Studies: DG Chest 2 View Result Date: 08/28/2024 EXAM: 2 VIEW(S) XRAY OF THE CHEST 08/28/2024 05:06:00 PM COMPARISON: 09/02/2023 CLINICAL HISTORY: Cough FINDINGS: LUNGS AND PLEURA: Low lung volumes. No focal pulmonary opacity. No pulmonary edema. No pleural effusion. No pneumothorax. HEART AND MEDIASTINUM: No acute abnormality of the cardiac and mediastinal silhouettes. BONES AND SOFT TISSUES: No acute osseous abnormality. IMPRESSION: 1. No acute cardiopulmonary process. Electronically signed by: Sean Gatlin MD 08/28/2024 08:00 PM EST RP Workstation: [id number]     Labs:  Basic Metabolic Panel: Recent Labs  Lab 08/29/24 0455 08/31/24 0514 09/04/24 0521  NA 132* 132* 136  K 5.2* 4.4 4.4  CL 97* 98 101  CO2 23 25 27   GLUCOSE 268* 238* 150*  BUN 22* 25* 23*  CREATININE 0.94 0.90 0.99  CALCIUM  8.6* 8.9 8.8*    CBC:    Latest Ref Rng & Units 09/04/2024    5:21 AM 08/31/2024    5:14 AM 08/28/2024    5:15 AM  CBC  WBC 4.0 - 10.5 K/uL 6.6  9.0  10.8   Hemoglobin 13.0 - 17.0 g/dL 89.5  89.7  9.3   Hematocrit 39.0 - 52.0 % 32.3  31.4  29.0   Platelets 150 - 400 K/uL 488  528  451      CBG: Recent Labs  Lab 09/03/24 1124 09/03/24 1653 09/03/24 2043 09/04/24 0527 09/04/24 1124  GLUCAP 141* 243* 159* 150* 123*    Brief HPI:   Sean Carter is a 53 y.o. male ***   Hospital Course: Sean Carter was admitted to rehab 08/24/2024 for inpatient  therapies to consist of PT, ST and OT at least three hours five days a week. Past admission physiatrist, therapy team and rehab RN have worked together to provide customized collaborative inpatient rehab.   Blood pressures were monitored on TID basis and   Diabetes has been monitored with ac/hs CBG checks and SSI was use prn for tighter BS control.    Rehab course: During patient's stay in rehab weekly team conferences were held to monitor patient's progress, set goals and discuss barriers to discharge. At admission, patient required  He has had improvement in activity tolerance, balance, postural control as well as ability to compensate for deficits.     Disposition:  Discharge disposition: 01-Home or Self Care        Diet:  Special Instructions:  Discharge Instructions     Ambulatory referral to Infectious Disease   Complete by: As directed    Would prefer an appointment with Dr. Fleeta Rothman   Ambulatory referral to Occupational Therapy   Complete by: As directed    Eval and treat   Ambulatory referral to Physical Medicine Rehab   Complete by: As directed    Hospital follow up   Ambulatory referral to Physical Therapy   Complete by: As directed    Eval and treat      Allergies as of 09/04/2024       Reactions  Scallops [shellfish Allergy] Anaphylaxis        Medication List     STOP taking these medications    acetaminophen  500 MG tablet Commonly known as: TYLENOL    albuterol  (2.5 MG/3ML) 0.083% nebulizer solution Commonly known as: PROVENTIL    atorvastatin  40 MG tablet Commonly known as: LIPITOR   hydrochlorothiazide  25 MG tablet Commonly known as: HYDRODIURIL    ibuprofen  200 MG tablet Commonly known as: ADVIL    insulin  aspart 100 UNIT/ML injection Commonly known as: novoLOG    insulin  glargine-yfgn 100 UNIT/ML injection Commonly known as: SEMGLEE    ondansetron  4 MG disintegrating tablet Commonly known as: ZOFRAN -ODT   penicillin  G  potassium 24 Million Units in dextrose  5 % 500 mL   rOPINIRole  1 MG tablet Commonly known as: REQUIP        TAKE these medications    acarbose 50 MG tablet Commonly known as: PRECOSE Take 1 tablet (50 mg total) by mouth 3 (three) times daily with meals.   amLODipine  10 MG tablet Commonly known as: NORVASC  Take 1 tablet (10 mg total) by mouth daily.   amoxicillin  500 MG capsule Commonly known as: AMOXIL  Take 2 capsules (1,000 mg total) by mouth 3 (three) times daily.   aspirin  EC 81 MG tablet Take 1 tablet (81 mg total) by mouth daily. Swallow whole.   B-complex with vitamin C tablet Take 1 tablet by mouth daily.   Basaglar  KwikPen 100 UNIT/ML Inject 40 Units into the skin daily.   cetirizine  10 MG tablet Commonly known as: ZYRTEC  Take 10 mg by mouth daily. Notes to patient: Can resume at home if needed   Elfolate Plus 3-35-2 MG Tabs Take 1 tablet by mouth daily.   famotidine  40 MG tablet Commonly known as: PEPCID  Take 1 tablet (40 mg total) by mouth 2 (two) times daily.   HumaLOG  KwikPen 100 UNIT/ML KwikPen Generic drug: insulin  lispro Inject 12 Units into the skin 3 (three) times daily. What changed:  how much to take when to take this Notes to patient: Take with meals if your blood sugar is over 80 and you plan on eating at least 50% of your meal. Do not take if you skip meals or are just going to nibble at your food.    Insupen Pen Needles 32G X 4 MM Misc Generic drug: Insulin  Pen Needle Use 3 (three) times daily.   montelukast  10 MG tablet Commonly known as: SINGULAIR  Take 1 tablet (10 mg total) by mouth at bedtime.   nutrition supplement (JUVEN) Pack Take 1 packet by mouth 2 (two) times daily between meals. Purchase Juven from [employer] or local store or can use protein powder  as additional supplement.   traMADol  50 MG tablet Commonly known as: Ultram  Take 1 tablet (50 mg total) by mouth every 6 (six) hours as needed. What changed:  when to take  this reasons to take this   vitamin D3 25 MCG tablet Commonly known as: CHOLECALCIFEROL Take 2 tablets (2,000 Units total) by mouth daily.        Follow-up Information     Howell Lunger, DO Follow up.   Specialty: Family Medicine Why: Call in 1-2 days for post hospital follow up Contact information: [address] [place] [address] [phone]         Lorilee Sven SQUIBB, MD Follow up.   Specialty: Physical Medicine and Rehabilitation Why: office will call you with follow up appointment. Contact information: [address] [place] [address] [phone]  Sheree Penne Bruckner, MD Follow up.   Specialties: Vascular Surgery, Cardiology Why: Call in 1-2 days for post hospital follow up Contact information: [address] [place] [address] [phone]         [facility]              Follow up.   Why: office will call you with follow up appointment Contact information: [address] [place]  [address]                Signed: Sharlet GORMAN Schmitz 09/04/2024, 4:52 PM

## 2024-09-02 ENCOUNTER — Other Ambulatory Visit (HOSPITAL_COMMUNITY): Payer: Self-pay

## 2024-09-02 DIAGNOSIS — Z89512 Acquired absence of left leg below knee: Secondary | ICD-10-CM | POA: Diagnosis not present

## 2024-09-02 LAB — GLUCOSE, CAPILLARY
Glucose-Capillary: 103 mg/dL — ABNORMAL HIGH (ref 70–99)
Glucose-Capillary: 123 mg/dL — ABNORMAL HIGH (ref 70–99)
Glucose-Capillary: 163 mg/dL — ABNORMAL HIGH (ref 70–99)
Glucose-Capillary: 162 mg/dL — ABNORMAL HIGH (ref 70–99)

## 2024-09-02 NOTE — Progress Notes (Signed)
 Occupational Therapy Discharge Summary  Patient Details  Name: Sean Carter MRN: [id number] Date of Birth: 1971/10/16  Date of Discharge from OT service:September 03, 2024  {CHL IP REHAB OT TIME CALCULATIONS:[id number]}   Patient has met 6 of 6 long term goals due to improved activity tolerance, improved balance, postural control, ability to compensate for deficits, improved attention, improved awareness, and improved coordination.  Patient to discharge at overall Modified Independent level.  Patient completes sink level ADLs seated in WC with Mod-I, functional transfers completed AP/PA or squat pivots when wearing RLE prosthetic. Patient's care partner is independent to provide the necessary physical assistance at discharge.    All Goals Met.  Recommendation:  Patient will benefit from ongoing skilled OT services in outpatient setting to continue to advance functional skills in the area of limb loss education, dynamic standing balance, global strengthening, and functional activity tolerance.  Equipment: Bedside Commode  Reasons for discharge: treatment goals met and discharge from hospital  Patient/family agrees with progress made and goals achieved: Yes  OT Discharge Precautions/Restrictions  Precautions Precautions: Fall Recall of Precautions/Restrictions: Intact Precaution/Restrictions Comments: wound on distal end of R residual limb. Improperly fitting prosthetic. Other Brace: L limb protector Restrictions Weight Bearing Restrictions Per Provider Order: Yes LLE Weight Bearing Per Provider Order: Non weight bearing General   Vital Signs   Pain Pain Assessment Pain Scale: 0-10 Pain Score: 0-No pain ADL ADL Eating: Independent Where Assessed-Eating: Wheelchair Grooming: Modified independent Where Assessed-Grooming: Wheelchair Upper Body Bathing: Modified independent Where Assessed-Upper Body Bathing: Wheelchair Lower Body Bathing: Modified independent Where  Assessed-Lower Body Bathing: Wheelchair Upper Body Dressing: Modified independent (Device) Where Assessed-Upper Body Dressing: Wheelchair Lower Body Dressing: Modified independent Where Assessed-Lower Body Dressing: Wheelchair Toileting: Modified independent Where Assessed-Toileting: Bedside Commode, Actuary Transfer: Directs care (Comment), Modified independent Toilet Transfer Method: Other (comment) (lateral scoot) Acupuncturist: Drop arm bedside commode, Grab bars Film/video Editor: Unable to assess Vision Baseline Vision/History: 1 Wears glasses;5 Retinopathy Patient Visual Report: No change from baseline Vision Assessment?: No apparent visual deficits Perception  Perception: Within Functional Limits Praxis Praxis: WFL Cognition Cognition Overall Cognitive Status: Within Functional Limits for tasks assessed Arousal/Alertness: Awake/alert Orientation Level: Person;Place;Situation Person: Oriented Place: Oriented Situation: Oriented Memory: Appears intact Awareness: Appears intact Problem Solving: Appears intact Safety/Judgment: Appears intact Sensation Sensation Light Touch: Appears Intact Hot/Cold: Not tested Proprioception: Appears Intact Stereognosis: Not tested Additional Comments: pt reports squeezing and phantom sensation in L residaul limb Coordination Gross Motor Movements are Fluid and Coordinated: No Fine Motor Movements are Fluid and Coordinated: Yes Coordination and Movement Description: grossly uncoordinated due to bilateral BKA and wound on R residual limb, impacting ability to wear prosthetic Finger Nose Finger Test: [facility] Heel Shin Test: unable to perform due to bilateral BKA Motor  Motor Motor: Abnormal postural alignment and control Motor - Skilled Clinical Observations: grossly uncoordinated due to bilateral BKA and wound on R residual limb, impacting ability to wear prosthetic Mobility  Bed Mobility Bed Mobility: Rolling  Right;Rolling Left;Supine to Sit;Sit to Supine Rolling Right: Independent with assistive device Rolling Left: Independent with assistive device Supine to Sit: Independent with assistive device Sit to Supine: Independent with assistive device Transfers Sit to Stand: Supervision/Verbal cueing Stand to Sit: Supervision/Verbal cueing  Trunk/Postural Assessment     Balance Balance Balance Assessed: Yes Static Sitting Balance Static Sitting - Balance Support: Bilateral upper extremity supported;Feet unsupported Static Sitting - Level of Assistance: 7: Independent Dynamic Sitting Balance Dynamic Sitting - Balance Support:  No upper extremity supported;Feet unsupported Dynamic Sitting - Level of Assistance: 6: Modified independent (Device/Increase time) Extremity/Trunk Assessment RUE Assessment RUE Assessment: Within Functional Limits LUE Assessment LUE Assessment: Within Functional Limits   Buffie Herne Woods-Chance, MS, OTR/L 09/02/2024, 5:34 PM

## 2024-09-02 NOTE — Progress Notes (Signed)
 PROGRESS NOTE   Subjective/Complaints:  Pt reports wife doesn't come home til Monday AM, when it was planned for him to d/c.  He says he cannot leave until Monday.  Said equipment being delivered today or tomorrow, but no one home to care for him when he gets home if left this weekend.  Frustrated with the way this was handled.  LBM yesterday  ROS:   Pt denies SOB, abd pain, CP, N/V/C/D, and vision changes   Objective:   No results found.   Recent Labs    08/31/24 0514  WBC 9.0  HGB 10.2*  HCT 31.4*  PLT 528*   Recent Labs    08/31/24 0514  NA 132*  K 4.4  CL 98  CO2 25  GLUCOSE 238*  BUN 25*  CREATININE 0.90  CALCIUM  8.9    Intake/Output Summary (Last 24 hours) at 09/02/2024 2000 Last data filed at 09/02/2024 1825 Gross per 24 hour  Intake 840 ml  Output 1850 ml  Net -1010 ml        Physical Exam: Vital Signs Blood pressure 134/68, pulse 62, temperature 98.6 F (37 C), resp. rate 16, height 5' 8 (1.727 m), weight 91.8 kg, SpO2 99%.    General: awake, alert, appropriate, sitting up in bed;  NAD HENT: conjugate gaze; oropharynx moist CV: regular rate and rhythm; no JVD Pulmonary: CTA B/L; no W/R/R- good air movement GI: soft, NT, ND, (+)BS Psychiatric: polite but frustrated with system/situation Neurological: Ox3  Musculoskeletal:     Cervical back: Neck supple. No tenderness.     Comments: Ue's 5/5 throughout B/L RLE_ HF/KE/KF 5/5- has R BKA- with scabs on R BKA from standing 9-12 hours/day at work LLE- HF/KE/KF 5/5- bulbous L BKA, stable 11/14 Skin:    General: Skin is warm.     Comments: A couple of open and abraded areas on R-BKA. L-BKA with dry dressing.  Per last pic- has oozing from incision- dog ears (+) and bulbous L Forearm IV- not leaking anymore- constantly on IV Pen G  Neurological:     Mental Status: He is alert and oriented to person, place, and time.     Comments:  Intact to light touch in all 4 extremities Ox3- keeps eyes closed due to fuzziness  Psychiatric:        Mood and Affect: Mood normal.        Behavior: Behavior normal.    Assessment/Plan: 1. Functional deficits which require 3+ hours per day of interdisciplinary therapy in a comprehensive inpatient rehab setting. Physiatrist is providing close team supervision and 24 hour management of active medical problems listed below. Physiatrist and rehab team continue to assess barriers to discharge/monitor patient progress toward functional and medical goals  Care Tool:  Bathing    Body parts bathed by patient: Right arm, Left arm, Chest, Abdomen, Front perineal area, Buttocks, Right upper leg, Left upper leg, Face     Body parts n/a: Left lower leg, Right lower leg   Bathing assist Assist Level: Independent with assistive device     Upper Body Dressing/Undressing Upper body dressing   What is the patient wearing?: Pull over shirt  Upper body assist Assist Level: Independent with assistive device    Lower Body Dressing/Undressing Lower body dressing      What is the patient wearing?: Ace wrap/stump shrinker, Orthosis, Pants     Lower body assist Assist for lower body dressing: Independent with assitive device     Toileting Toileting    Toileting assist Assist for toileting: Independent with assistive device     Transfers Chair/bed transfer  Transfers assist     Chair/bed transfer assist level: Supervision/Verbal cueing     Locomotion Ambulation   Ambulation assist   Ambulation activity did not occur: Safety/medical concerns (R tibia wound, bilateral BKA, decreased balance/coordination, weakness)          Walk 10 feet activity   Assist  Walk 10 feet activity did not occur: Safety/medical concerns (R tibia wound, bilateral BKA, decreased balance/coordination, weakness)        Walk 50 feet activity   Assist Walk 50 feet with 2 turns activity did  not occur: Safety/medical concerns (R tibia wound, bilateral BKA, decreased balance/coordination, weakness)         Walk 150 feet activity   Assist Walk 150 feet activity did not occur: Safety/medical concerns (R tibia wound, bilateral BKA, decreased balance/coordination, weakness)         Walk 10 feet on uneven surface  activity   Assist Walk 10 feet on uneven surfaces activity did not occur: Safety/medical concerns (R tibia wound, bilateral BKA, decreased balance/coordination, weakness)         Wheelchair     Assist Is the patient using a wheelchair?: Yes Type of Wheelchair: Manual    Wheelchair assist level: Independent Max wheelchair distance: 150'    Wheelchair 50 feet with 2 turns activity    Assist        Assist Level: Independent   Wheelchair 150 feet activity     Assist      Assist Level: Independent   Blood pressure 134/68, pulse 62, temperature 98.6 F (37 C), resp. rate 16, height 5' 8 (1.727 m), weight 91.8 kg, SpO2 99%.  Medical Problem List and Plan: 1. Functional deficits secondary to L BKA in setting of old R BKA due to osteomyelitis and gangrene             -patient may not shower             -ELOS/Goals: 7-10 days supervision to mod I at w/c level Chart and therapy notes reviewed, continue CIR, discussed patient's progress with therapy Grounds pass ordered Vitamin D3/Metanx/Vitamin B/C complex ordered F/u with me or Fidela in clinic within 1 month Would benefit from home health aide upon discharge   Con't CIR PT and OT  D/c Monday- since doesn't have a way to go home prior 2.  Antithrombotics: -DVT/anticoagulation:  Pharmaceutical: continue Lovenox              -antiplatelet therapy: ASA  3. Back pain: CT spine ordered to assess for discitis. Hydrocodone--->changed to tramadol  prn, continue             --Ketorolac  X 5 days thru thru 11/08. Monitor renal status/BP stopped Norco due to LFT elevation  4.  Mood/Behavior/Sleep: LCSW to follow for evaluation and support.              -antipsychotic agents: N/A  5. Neuropsych/cognition: This patient is capable of making decisions on his own behalf.  6. Skin/Wound Care: Routine pressure relief measures.  --Low carb protein supplements for wound  healing.   7. Fluids/Electrolytes/Nutrition: Monitor I/O. Check CMET in am  8. Group B strep bactermia: Antibiotics changed to  PCN G             --MRI TL spine limited by claustrophobia.             --2 d echo OK  continue IV Penicillin , can switch to amoxicillin  upon d/c as per ID  11/15- d/c Monday 9. T2DM: Hgb A1c 6.5 and well controlled on Insulin  glargine 40 units w/20 units lispro bid.  --Will monitor BS ac/hs and use SSI for elevated BS             --Increase basal insulin  to 30 units. D/c feeding supplement. Acarbose added-increase to 50mg  TID  Increase semglee  to 40U HS, increase novolog  to 11U with meals  11/15- BG's doing better with increase  CBG (last 3)  Recent Labs    09/02/24 0528 09/02/24 1131 09/02/24 1650  GLUCAP 103* 123* 163*   10.  HTN: Monitor BP TID--continue amlodipine  10mg  daily, acarbose added as above for diabetes and can also help HTN  11/15- BP controlled- con't regimen    09/02/2024    7:20 PM 09/02/2024    6:42 AM 09/02/2024    3:39 AM  Vitals with BMI  Weight  202 lbs 6 oz   BMI  30.78   Systolic 134  129  Diastolic 68  68  Pulse 62  58     11. Abnormal LFTs: reviewed and continue to uptrend, d/c statin until they start to come down, repeat tomorrow.   - 11/10 alk phos, AST and ALT improved today, continue current regimen  11/15- LFTS 57/84- would recheck prior to d/c Monday - changed to check CMP monday 12. ABLA: Hgb reviewed and is trending upward  13. Acute renal failure: Due to infection and has resolved.  Cr reviewed and is stable  14.  Hyponatremia/hyperkalemia  -lokelma  today, K+ reviewed and improved on repeat  I spent a total of 38    minutes on total care today- >50% coordination of care- due to   D/w PA at length about pt and with pt about dispo-    LOS: 9 days A FACE TO FACE EVALUATION WAS PERFORMED  Tafari Humiston 09/02/2024, 8:00 PM

## 2024-09-02 NOTE — Progress Notes (Signed)
 Physical Therapy Session Note  Patient Details  Name: Sean Carter MRN: [id number] Date of Birth: July 27, 1971  Today's Date: 09/02/2024 PT Individual Time: 9154-9044 PT Individual Time Calculation (min): 70 min   Short Term Goals: Week 1:  PT Short Term Goal 1 (Week 1): STG = LTG d/t ELOS  Skilled Therapeutic Interventions/Progress Updates:   Received pt sitting in WC at sink shaving with prosthetic donned. Pt agreeable to PT treatment and did not c/o pain during session. Session with emphasis on functional mobility/transfers, generalized strengthening and endurance, dynamic sitting/standing balance/coordination, simulated car transfers, and limb loss/limb care education. Pt washed up/shaved at sink mod I, then donned shirt mod I.   Reinforced education on the following: -wearing 2XL shrinkers directly over incision on LLE and directly over wound on RLE -placing gel liner on top of shrinker, then applying socks and prosthetic -wearing prosthetic for transfers ONLY until wound heals -wear schedule for prosthetic: 2 hours at a time MAX, then removing -wearing limb guard on LLE with ALL transfers (pt reports falling multiple times after 1st amputation)  Pt performed WC mobility 16ft using BUE and mod I to ortho gym. Pt performed simulated car transfer via stand<>pivot without slideboard and close supervision x 2 trials - cues for hand placement and to avoid grabbing onto mobile doorframe. Pt then practiced propelling up/down 93ft ramp with supervision. Performed x4 squat<>pivot transfers to/from mat with CGA fading to close supervision throughout session. Worked on technique and hand placement for sit<>stands x10 with CGA fading to close supervision. Cues provided to keep LUE on RW and push up from mat with RUE. Pt then performed seated tricep pushups 2x10 with emphasis on UE strength. Stood from [name] with RW and close supervision and performed the following exercises with emphasis on UE/LE  strength/ROM: -LLE hip flexion 2x10 -LLE hip abduction 2x10 -attempted push ups on RW however pt unable to clear foot and limited by fear (pt reports this is how he fell last time). Attempted in // bars, but pt still unable to clear R foot.   Returned to [name] and performed the following exercises: -seated L knee flexion/extension ROM 2x20 -seated hip abduction with blue TB 2x15 -knee flexion with blue TB 2x15 bilaterally.  Pt performed WC mobility 153ft using BUE and mod I back to room and RN present to connect IV and attend to care.   Therapy Documentation Precautions:  Precautions Precautions: Fall Recall of Precautions/Restrictions: Intact Precaution/Restrictions Comments: wound on distal end of R residual limb. Improperly fitting prosthetic. Required Braces or Orthoses: Other Brace Other Brace: L limb protector Restrictions Weight Bearing Restrictions Per Provider Order: Yes LLE Weight Bearing Per Provider Order: Non weight bearing  Therapy/Group: Individual Therapy Therisa HERO Zaunegger Therisa Stains PT, DPT 09/02/2024, 6:52 AM

## 2024-09-02 NOTE — Progress Notes (Signed)
 Occupational Therapy Session Note  Patient Details  Name: Sean Carter MRN: [id number] Date of Birth: 06/23/1971  Today's Date: 09/02/2024 OT Individual Time: 8697-8582 OT Individual Time Calculation (min): 75 min    Short Term Goals: Week 1:  OT Short Term Goal 1 (Week 1): STG=LTG d/t ELOS  Skilled Therapeutic Interventions/Progress Updates:  Skilled OT session completed to address discharge planning and functional transfers. Pt received supine in bed, agreeable to participate in therapy. Pt reports no pain.  OT and pt collaborate on functional transfers in home environment d/t pt cleared to don RLE prosthetic for transfers only. The following transfers were completed this session with emphasis on safety and independence in home environment: -WC>Toilet squat pivot: 2 trials Min A trial one to position hips for safety, trial 2 SBA. Pt reports difficulty pivoting with RLE, will continue to follow up with prosthesis specialist at discharge.  -WC>BSC stand pivot at RW 1 trial: pt declines hopping to complete transfer d/t fear of falling. Pt able to slowly turn with increased time to complete forward/return transfer. Can stand at [facility] for clothing mgmt or lateral weight shift seated.  OT currently recommending use of BSC if pt is unable to self-propel into bathroom at home, pt received 18x20 personal WC during session and feels more confident that chair will fit in bathroom.  -STS at RW: 3 trials wit CGA, pt continues to display fear of falling; however, no LOB noted.    OT provided education on WC mgmt in home, demoing don/doff of brake extenders, cushion mgmt, anti-tipper don/doff, swing away arm rests, and folding WC for transport. Pt verbalizes understanding and completes teach back to support carryover with wife. WC>EOB squat pivot Mod-I. Pt doffs prosthetic with independence, OT prompts pt complete skin check following each wear. Pt verbalized understanding. Pt seated EOB with all need in  reach and session complete.  Therapy Documentation Precautions:  Precautions Precautions: Fall Recall of Precautions/Restrictions: Intact Precaution/Restrictions Comments: wound on distal end of R residual limb. Improperly fitting prosthetic. Required Braces or Orthoses: Other Brace Other Brace: L limb protector Restrictions Weight Bearing Restrictions Per Provider Order: Yes LLE Weight Bearing Per Provider Order: Non weight bearing   Therapy/Group: Individual Therapy  Kailiana Granquist Woods-Chance, MS, OTR/L 09/02/2024, 8:17 AM

## 2024-09-03 ENCOUNTER — Other Ambulatory Visit (HOSPITAL_COMMUNITY): Payer: Self-pay

## 2024-09-03 DIAGNOSIS — Z89512 Acquired absence of left leg below knee: Secondary | ICD-10-CM | POA: Diagnosis not present

## 2024-09-03 LAB — GLUCOSE, CAPILLARY
Glucose-Capillary: 144 mg/dL — ABNORMAL HIGH (ref 70–99)
Glucose-Capillary: 141 mg/dL — ABNORMAL HIGH (ref 70–99)
Glucose-Capillary: 243 mg/dL — ABNORMAL HIGH (ref 70–99)
Glucose-Capillary: 159 mg/dL — ABNORMAL HIGH (ref 70–99)

## 2024-09-03 NOTE — Progress Notes (Signed)
  Progress Note    09/03/2024 11:02 AM * No surgery found *  Subjective:  doing well, understandably upset about [place] close loss yesterday  Vitals:   09/02/24 1920 09/03/24 0508  BP: 134/68 137/73  Pulse: 62 63  Resp: 16 16  Temp: 98.6 F (37 C) 98.1 F (36.7 C)  SpO2: 99% 97%    Physical Exam: Left bka site healing, oedema much improved  CBC    Component Value Date/Time   WBC 9.0 08/31/2024 0514   RBC 3.76 (L) 08/31/2024 0514   HGB 10.2 (L) 08/31/2024 0514   HGB 15.0 05/03/2024 1039   HCT 31.4 (L) 08/31/2024 0514   HCT 45.7 05/03/2024 1039   PLT 528 (H) 08/31/2024 0514   PLT 327 05/03/2024 1039   MCV 83.5 08/31/2024 0514   MCV 86 05/03/2024 1039   MCH 27.1 08/31/2024 0514   MCHC 32.5 08/31/2024 0514   RDW 13.9 08/31/2024 0514   RDW 13.3 05/03/2024 1039   LYMPHSABS 2.4 08/26/2024 0520   LYMPHSABS 2.3 05/03/2024 1039   MONOABS 1.0 08/26/2024 0520   EOSABS 0.2 08/26/2024 0520   EOSABS 0.2 05/03/2024 1039   BASOSABS 0.1 08/26/2024 0520   BASOSABS 0.0 05/03/2024 1039    BMET    Component Value Date/Time   NA 132 (L) 08/31/2024 0514   NA 138 05/03/2024 1039   K 4.4 08/31/2024 0514   CL 98 08/31/2024 0514   CO2 25 08/31/2024 0514   GLUCOSE 238 (H) 08/31/2024 0514   BUN 25 (H) 08/31/2024 0514   BUN 21 05/03/2024 1039   CREATININE 0.90 08/31/2024 0514   CREATININE 0.78 07/10/2020 0910   CALCIUM  8.9 08/31/2024 0514   GFRNONAA >60 08/31/2024 0514   GFRNONAA 106 07/10/2020 0910   GFRAA 123 07/10/2020 0910    INR    Component Value Date/Time   INR 1.2 08/21/2024 1308     Intake/Output Summary (Last 24 hours) at 09/03/2024 1102 Last data filed at 09/03/2024 0731 Gross per 24 hour  Intake 1080 ml  Output 2275 ml  Net -1195 ml     Assessment/plan:  53 y.o. male is s/p L BKA, progressing as expected. Will f/u in office 2-3 weeks to eval for staple removal    Dillinger Aston C. Sheree, MD [facility] Office:  [phone] Pager: [phone]  09/03/2024 11:02 AM

## 2024-09-03 NOTE — Plan of Care (Signed)
  Problem: RH Balance Goal: LTG Patient will maintain dynamic sitting balance (PT) Description: LTG:  Patient will maintain dynamic sitting balance with assistance during mobility activities (PT) Outcome: Completed/Met   Problem: RH Bed Mobility Goal: LTG Patient will perform bed mobility with assist (PT) Description: LTG: Patient will perform bed mobility with assistance, with/without cues (PT). Outcome: Completed/Met   Problem: RH Bed to Chair Transfers Goal: LTG Patient will perform bed/chair transfers w/assist (PT) Description: LTG: Patient will perform bed to chair transfers with assistance (PT). Outcome: Completed/Met   Problem: RH Car Transfers Goal: LTG Patient will perform car transfers with assist (PT) Description: LTG: Patient will perform car transfers with assistance (PT). Outcome: Completed/Met   Problem: RH Furniture Transfers Goal: LTG Patient will perform furniture transfers w/assist (OT/PT) Description: LTG: Patient will perform furniture transfers  with assistance (OT/PT). Outcome: Completed/Met   Problem: RH Wheelchair Mobility Goal: LTG Patient will propel w/c in controlled environment (PT) Description: LTG: Patient will propel wheelchair in controlled environment, # of feet with assist (PT) Outcome: Completed/Met Goal: LTG Patient will propel w/c in home environment (PT) Description: LTG: Patient will propel wheelchair in home environment, # of feet with assistance (PT). Outcome: Completed/Met   Problem: RH Balance Goal: LTG Patient will maintain dynamic sitting balance (PT) Description: LTG:  Patient will maintain dynamic sitting balance with assistance during mobility activities (PT) Outcome: Completed/Met   Problem: RH Bed Mobility Goal: LTG Patient will perform bed mobility with assist (PT) Description: LTG: Patient will perform bed mobility with assistance, with/without cues (PT). Outcome: Completed/Met   Problem: RH Bed to Chair Transfers Goal:  LTG Patient will perform bed/chair transfers w/assist (PT) Description: LTG: Patient will perform bed to chair transfers with assistance (PT). Outcome: Completed/Met   Problem: RH Car Transfers Goal: LTG Patient will perform car transfers with assist (PT) Description: LTG: Patient will perform car transfers with assistance (PT). Outcome: Completed/Met   Problem: RH Furniture Transfers Goal: LTG Patient will perform furniture transfers w/assist (OT/PT) Description: LTG: Patient will perform furniture transfers  with assistance (OT/PT). Outcome: Completed/Met   Problem: RH Wheelchair Mobility Goal: LTG Patient will propel w/c in controlled environment (PT) Description: LTG: Patient will propel wheelchair in controlled environment, # of feet with assist (PT) Outcome: Completed/Met Goal: LTG Patient will propel w/c in home environment (PT) Description: LTG: Patient will propel wheelchair in home environment, # of feet with assistance (PT). Outcome: Completed/Met

## 2024-09-03 NOTE — Progress Notes (Signed)
 Physical Therapy Session Note  Patient Details  Name: Sean Carter MRN: [id number] Date of Birth: [birth year]  Today's Date: 09/03/2024 PT Individual Time: 8992-8951 PT Individual Time Calculation (min): 41 min   Short Term Goals: Week 1:  PT Short Term Goal 1 (Week 1): STG = LTG d/t ELOS  Skilled Therapeutic Interventions/Progress Updates:      Pt supine in bed upon arrival. Pt agreeable to therapy. Pt denies any pain.   Pt slide board and 18x18 WC has been delivered however pt B LE elevating leg rests have not yet been delivered. PT notified primary PT, OT and SW. Pt politely declining get into his WC at this time as he  feels it is the same as the one he has been using during CIR stay.   Pt asking questions regaridng follow up with Dr. Sheree and/or his PA. Deferred pt questions to his MD. Notified Dr. Atha via secure chat.   Pt denies any questions/concerns regarding discharge home. Pt endorses feeling great about car transfer and endorses level entry and will be living on main level with his wife providing supervision/assistance as needed.   Pt politely declining getting OOB during session 2/2 fatigue and eagerness to get home but agreeable to review education.   Pt provided teach back of recommendations for wearing prosthetic for 2 hours maximum right now and for transfers only until follow up with [facility] after discharge. Pt provided teach back of need to wear shrinker, liner, socks, and prosthetic.   PT followed up with pt regarding:   Technique and frequency of cleaning shrinker  Technique and frequency of performing skin inspection  Recognizing signs and symptoms of pressure injury/infection-and going to the doctor immediately if these occur  HEP  Purpose and frequency of wear of limb protector  Pt supine in bed upon arrival with all needs within reach and bed alarm on.              Therapy Documentation Precautions:  Precautions Precautions: Fall Recall of  Precautions/Restrictions: Intact Precaution/Restrictions Comments: wound on distal end of R residual limb. Improperly fitting prosthetic. Required Braces or Orthoses: Other Brace Other Brace: L limb protector Restrictions Weight Bearing Restrictions Per Provider Order: Yes LLE Weight Bearing Per Provider Order: Non weight bearing  Therapy/Group: Individual Therapy  [facility] Doreene Orris, [place], DPT  09/03/2024, 7:51 AM

## 2024-09-03 NOTE — Progress Notes (Signed)
 PROGRESS NOTE   Subjective/Complaints:  Pt reports is golden this Am- pain controlled/none.  LBM yesterday Getting AM meds from nursing right now ROS:    Pt denies SOB, abd pain, CP, N/V/C/D, and vision changes Per HPI   Objective:   No results found.   No results for input(s): WBC, HGB, HCT, PLT in the last 72 hours.  No results for input(s): NA, K, CL, CO2, GLUCOSE, BUN, CREATININE, CALCIUM  in the last 72 hours.   Intake/Output Summary (Last 24 hours) at 09/03/2024 1059 Last data filed at 09/03/2024 0731 Gross per 24 hour  Intake 1080 ml  Output 2275 ml  Net -1195 ml        Physical Exam: Vital Signs Blood pressure 137/73, pulse 63, temperature 98.1 F (36.7 C), temperature source Oral, resp. rate 16, height 5' 8 (1.727 m), weight 91.7 kg, SpO2 97%.     General: awake, alert, appropriate, sitting up in bed; nurse at bedside; NAD HENT: conjugate gaze; oropharynx moist CV: regular rate and rhythm; no JVD Pulmonary: CTA B/L; no W/R/R- good air movement GI: soft, NT, ND, (+)BS Psychiatric: appropriate- appears less frustrated about d/c issues Neurological: Ox3 L BKA - not assessed today  Musculoskeletal:     Cervical back: Neck supple. No tenderness.     Comments: Ue's 5/5 throughout B/L RLE_ HF/KE/KF 5/5- has R BKA- with scabs on R BKA from standing 9-12 hours/day at work LLE- HF/KE/KF 5/5- bulbous L BKA, stable 11/14 Skin:    General: Skin is warm.     Comments: A couple of open and abraded areas on R-BKA. L-BKA with dry dressing.  Per last pic- has oozing from incision- dog ears (+) and bulbous L Forearm IV- not leaking anymore- constantly on IV Pen G  Neurological:     Mental Status: He is alert and oriented to person, place, and time.     Comments: Intact to light touch in all 4 extremities Ox3- keeps eyes closed due to fuzziness  Psychiatric:        Mood and  Affect: Mood normal.        Behavior: Behavior normal.    Assessment/Plan: 1. Functional deficits which require 3+ hours per day of interdisciplinary therapy in a comprehensive inpatient rehab setting. Physiatrist is providing close team supervision and 24 hour management of active medical problems listed below. Physiatrist and rehab team continue to assess barriers to discharge/monitor patient progress toward functional and medical goals  Care Tool:  Bathing    Body parts bathed by patient: Right arm, Left arm, Chest, Abdomen, Front perineal area, Buttocks, Right upper leg, Left upper leg, Face     Body parts n/a: Left lower leg, Right lower leg   Bathing assist Assist Level: Independent with assistive device     Upper Body Dressing/Undressing Upper body dressing   What is the patient wearing?: Pull over shirt    Upper body assist Assist Level: Independent with assistive device    Lower Body Dressing/Undressing Lower body dressing      What is the patient wearing?: Ace wrap/stump shrinker, Orthosis, Pants     Lower body assist Assist for lower body dressing: Independent with assitive  device     Toileting Toileting    Toileting assist Assist for toileting: Independent with assistive device     Transfers Chair/bed transfer  Transfers assist     Chair/bed transfer assist level: Supervision/Verbal cueing     Locomotion Ambulation   Ambulation assist   Ambulation activity did not occur: Safety/medical concerns (R tibia wound, bilateral BKA, decreased balance/coordination, weakness)          Walk 10 feet activity   Assist  Walk 10 feet activity did not occur: Safety/medical concerns (R tibia wound, bilateral BKA, decreased balance/coordination, weakness)        Walk 50 feet activity   Assist Walk 50 feet with 2 turns activity did not occur: Safety/medical concerns (R tibia wound, bilateral BKA, decreased balance/coordination, weakness)          Walk 150 feet activity   Assist Walk 150 feet activity did not occur: Safety/medical concerns (R tibia wound, bilateral BKA, decreased balance/coordination, weakness)         Walk 10 feet on uneven surface  activity   Assist Walk 10 feet on uneven surfaces activity did not occur: Safety/medical concerns (R tibia wound, bilateral BKA, decreased balance/coordination, weakness)         Wheelchair     Assist Is the patient using a wheelchair?: Yes Type of Wheelchair: Manual    Wheelchair assist level: Independent Max wheelchair distance: 150'    Wheelchair 50 feet with 2 turns activity    Assist        Assist Level: Independent   Wheelchair 150 feet activity     Assist      Assist Level: Independent   Blood pressure 137/73, pulse 63, temperature 98.1 F (36.7 C), temperature source Oral, resp. rate 16, height 5' 8 (1.727 m), weight 91.7 kg, SpO2 97%.  Medical Problem List and Plan: 1. Functional deficits secondary to L BKA in setting of old R BKA due to osteomyelitis and gangrene             -patient may not shower             -ELOS/Goals: 7-10 days supervision to mod I at w/c level Chart and therapy notes reviewed, continue CIR, discussed patient's progress with therapy Grounds pass ordered Vitamin D3/Metanx/Vitamin B/C complex ordered F/u with me or Fidela in clinic within 1 month Would benefit from home health aide upon discharge   Con't CIR pT and OT  D/c Monday 11/17- doesn't have  a way to leave prior.  2.  Antithrombotics: -DVT/anticoagulation:  Pharmaceutical: continue Lovenox              -antiplatelet therapy: ASA  3. Back pain: CT spine ordered to assess for discitis. Hydrocodone--->changed to tramadol  prn, continue             --Ketorolac  X 5 days thru thru 11/08. Monitor renal status/BP stopped Norco due to LFT elevation  4. Mood/Behavior/Sleep: LCSW to follow for evaluation and support.              -antipsychotic agents:  N/A  5. Neuropsych/cognition: This patient is capable of making decisions on his own behalf.  6. Skin/Wound Care: Routine pressure relief measures.  --Low carb protein supplements for wound healing.   7. Fluids/Electrolytes/Nutrition: Monitor I/O. Check CMET in am  8. Group B strep bactermia: Antibiotics changed to  PCN G             --MRI TL spine limited by claustrophobia.             --  2 d echo OK  continue IV Penicillin , can switch to amoxicillin  upon d/c as per ID  11/15- d/c Monday of note 9. T2DM: Hgb A1c 6.5 and well controlled on Insulin  glargine 40 units w/20 units lispro bid.  --Will monitor BS ac/hs and use SSI for elevated BS             --Increase basal insulin  to 30 units. D/c feeding supplement. Acarbose added-increase to 50mg  TID  Increase semglee  to 40U HS, increase novolog  to 11U with meals  11/15- BG's doing better with increase  11/16- Running 140s- 160's- -con't regimen  CBG (last 3)  Recent Labs    09/02/24 1650 09/02/24 2030 09/03/24 0537  GLUCAP 163* 162* 144*   10.  HTN: Monitor BP TID--continue amlodipine  10mg  daily, acarbose added as above for diabetes and can also help HTN  11/15- 11/16- BP controlled- con't regimen    09/03/2024    6:21 AM 09/03/2024    5:08 AM 09/02/2024    7:20 PM  Vitals with BMI  Weight 202 lbs 3 oz    BMI 30.75    Systolic  137 134  Diastolic  73 68  Pulse  63 62     11. Abnormal LFTs: reviewed and continue to uptrend, d/c statin until they start to come down, repeat tomorrow.   - 11/10 alk phos, AST and ALT improved today, continue current regimen  11/15- LFTS 57/84- would recheck prior to d/c Monday - changed to check CMP monday 12. ABLA: Hgb reviewed and is trending upward  13. Acute renal failure: Due to infection and has resolved.  Cr reviewed and is stable  14.  Hyponatremia/hyperkalemia  -lokelma  today, K+ reviewed and improved on repeat     LOS: 10 days A FACE TO FACE EVALUATION WAS  PERFORMED  Sean Carter 09/03/2024, 10:59 AM

## 2024-09-03 NOTE — Plan of Care (Signed)
  Problem: RH Eating Goal: LTG Patient will perform eating w/assist, cues/equip (OT) Description: LTG: Patient will perform eating with assist, with/without cues using equipment (OT) Outcome: Completed/Met Flowsheets (Taken 08/25/2024 1801 by Woods-Chance, Genesis T, OT) LTG: Pt will perform eating with assistance level of: Independent   Problem: RH Grooming Goal: LTG Patient will perform grooming w/assist,cues/equip (OT) Description: LTG: Patient will perform grooming with assist, with/without cues using equipment (OT) Outcome: Completed/Met Flowsheets (Taken 08/25/2024 1801 by Woods-Chance, Genesis T, OT) LTG: Pt will perform grooming with assistance level of: Independent with assistive device    Problem: RH Bathing Goal: LTG Patient will bathe all body parts with assist levels (OT) Description: LTG: Patient will bathe all body parts with assist levels (OT) Outcome: Completed/Met Flowsheets (Taken 08/25/2024 1801 by Woods-Chance, Genesis T, OT) LTG: Pt will perform bathing with assistance level/cueing: Independent with assistive device    Problem: RH Dressing Goal: LTG Patient will perform upper body dressing (OT) Description: LTG Patient will perform upper body dressing with assist, with/without cues (OT). Outcome: Completed/Met Flowsheets (Taken 08/25/2024 1801 by Woods-Chance, Genesis T, OT) LTG: Pt will perform upper body dressing with assistance level of: Independent with assistive device Goal: LTG Patient will perform lower body dressing w/assist (OT) Description: LTG: Patient will perform lower body dressing with assist, with/without cues in positioning using equipment (OT) Outcome: Completed/Met Flowsheets (Taken 09/03/2024 1258) LTG: Pt will perform lower body dressing with assistance level of: Independent with assistive device   Problem: RH Toileting Goal: LTG Patient will perform toileting task (3/3 steps) with assistance level (OT) Description: LTG: Patient will perform  toileting task (3/3 steps) with assistance level (OT)  Outcome: Completed/Met Flowsheets (Taken 09/03/2024 1258) LTG: Pt will perform toileting task (3/3 steps) with assistance level: Independent with assistive device   Problem: RH Toilet Transfers Goal: LTG Patient will perform toilet transfers w/assist (OT) Description: LTG: Patient will perform toilet transfers with assist, with/without cues using equipment (OT) Outcome: Completed/Met Flowsheets (Taken 08/25/2024 1801 by Woods-Chance, Genesis T, OT) LTG: Pt will perform toilet transfers with assistance level of: Independent with assistive device   Problem: RH Tub/Shower Transfers Goal: LTG Patient will perform tub/shower transfers w/assist (OT) Description: LTG: Patient will perform tub/shower transfers with assist, with/without cues using equipment (OT) Outcome: Completed/Met Flowsheets (Taken 09/03/2024 1258) LTG: Pt will perform tub/shower stall transfers with assistance level of: Independent with assistive device

## 2024-09-04 ENCOUNTER — Other Ambulatory Visit (HOSPITAL_COMMUNITY): Payer: Self-pay

## 2024-09-04 DIAGNOSIS — R7989 Other specified abnormal findings of blood chemistry: Secondary | ICD-10-CM | POA: Insufficient documentation

## 2024-09-04 DIAGNOSIS — S88112S Complete traumatic amputation at level between knee and ankle, left lower leg, sequela: Secondary | ICD-10-CM | POA: Diagnosis not present

## 2024-09-04 LAB — COMPREHENSIVE METABOLIC PANEL WITH GFR
ALT: 37 U/L (ref 0–44)
AST: 23 U/L (ref 15–41)
Albumin: 2.3 g/dL — ABNORMAL LOW (ref 3.5–5.0)
Alkaline Phosphatase: 109 U/L (ref 38–126)
Anion gap: 8 (ref 5–15)
BUN: 23 mg/dL — ABNORMAL HIGH (ref 6–20)
CO2: 27 mmol/L (ref 22–32)
Calcium: 8.8 mg/dL — ABNORMAL LOW (ref 8.9–10.3)
Chloride: 101 mmol/L (ref 98–111)
Creatinine, Ser: 0.99 mg/dL (ref 0.61–1.24)
GFR, Estimated: >60 mL/min (ref >60)
Glucose, Bld: 150 mg/dL — ABNORMAL HIGH (ref 70–99)
Potassium: 4.4 mmol/L (ref 3.5–5.1)
Sodium: 136 mmol/L (ref 135–145)
Total Bilirubin: 0.6 mg/dL (ref 0.0–1.2)
Total Protein: 6.6 g/dL (ref 6.5–8.1)

## 2024-09-04 LAB — CBC
HCT: 32.3 % — ABNORMAL LOW (ref 39.0–52.0)
Hemoglobin: 10.4 g/dL — ABNORMAL LOW (ref 13.0–17.0)
MCH: 26.9 pg (ref 26.0–34.0)
MCHC: 32.2 g/dL (ref 30.0–36.0)
MCV: 83.5 fL (ref 80.0–100.0)
Platelets: 488 K/uL — ABNORMAL HIGH (ref 150–400)
RBC: 3.87 MIL/uL — ABNORMAL LOW (ref 4.22–5.81)
RDW: 14.1 % (ref 11.5–15.5)
WBC: 6.6 K/uL (ref 4.0–10.5)
nRBC: 0 % (ref 0.0–0.2)

## 2024-09-04 LAB — GLUCOSE, CAPILLARY
Glucose-Capillary: 150 mg/dL — ABNORMAL HIGH (ref 70–99)
Glucose-Capillary: 123 mg/dL — ABNORMAL HIGH (ref 70–99)

## 2024-09-04 MED ORDER — AMOXICILLIN 500 MG PO CAPS
1000.0000 mg | ORAL_CAPSULE | Freq: Three times a day (TID) | ORAL | Status: DC
  Administered 2024-09-04 (×2): 1000 mg via ORAL
  Filled 2024-09-04 (×3): qty 2

## 2024-09-04 MED ORDER — AMOXICILLIN 500 MG PO CAPS
1000.0000 mg | ORAL_CAPSULE | Freq: Three times a day (TID) | ORAL | 0 refills | Status: DC
  Filled 2024-09-04: qty 18, 3d supply, fill #0

## 2024-09-04 MED ORDER — AMLODIPINE BESYLATE 10 MG PO TABS: 10.0000 mg | ORAL_TABLET | Freq: Every day | ORAL | Status: DC

## 2024-09-04 NOTE — Progress Notes (Signed)
 PROGRESS NOTE   Subjective/Complaints: No new complaints this morning Stable for d/c today Discussed transition from PCN to amoxicillin  Discussed CBGs   ROS:    Pt denies SOB, abd pain, CP, N/V/C/D, and vision changes Per HPI   Objective:   No results found.   Recent Labs    09/04/24 0521  WBC 6.6  HGB 10.4*  HCT 32.3*  PLT 488*    Recent Labs    09/04/24 0521  NA 136  K 4.4  CL 101  CO2 27  GLUCOSE 150*  BUN 23*  CREATININE 0.99  CALCIUM  8.8*     Intake/Output Summary (Last 24 hours) at 09/04/2024 0932 Last data filed at 09/04/2024 0724 Gross per 24 hour  Intake 712 ml  Output 2775 ml  Net -2063 ml        Physical Exam: Vital Signs Blood pressure (!) 142/70, pulse 62, temperature 98 F (36.7 C), resp. rate 17, height 5' 8 (1.727 m), weight 91.7 kg, SpO2 95%.     General: awake, alert, appropriate, sitting up in bed; nurse at bedside; NAD HENT: conjugate gaze; oropharynx moist CV: regular rate and rhythm; no JVD Pulmonary: CTA B/L; no W/R/R- good air movement GI: soft, NT, ND, (+)BS Psychiatric: appropriate- appears less frustrated about d/c issues Neurological: Ox3 L BKA - not assessed today  Musculoskeletal:     Cervical back: Neck supple. No tenderness.     Comments: Ue's 5/5 throughout B/L RLE_ HF/KE/KF 5/5- has R BKA- with scabs on R BKA from standing 9-12 hours/day at work LLE- HF/KE/KF 5/5- bulbous L BKA, stable 11/17 Skin:    General: Skin is warm.     Comments: A couple of open and abraded areas on R-BKA. L-BKA with dry dressing.  Per last pic- has oozing from incision- dog ears (+) and bulbous L Forearm IV- not leaking anymore- constantly on IV Pen G  Neurological:     Mental Status: He is alert and oriented to person, place, and time.     Comments: Intact to light touch in all 4 extremities Ox3- keeps eyes closed due to fuzziness  Psychiatric:        Mood and  Affect: Mood normal.        Behavior: Behavior normal.    Assessment/Plan: 1. Functional deficits which require 3+ hours per day of interdisciplinary therapy in a comprehensive inpatient rehab setting. Physiatrist is providing close team supervision and 24 hour management of active medical problems listed below. Physiatrist and rehab team continue to assess barriers to discharge/monitor patient progress toward functional and medical goals  Care Tool:  Bathing    Body parts bathed by patient: Right arm, Left arm, Chest, Abdomen, Front perineal area, Buttocks, Right upper leg, Left upper leg, Face     Body parts n/a: Left lower leg, Right lower leg   Bathing assist Assist Level: Independent with assistive device     Upper Body Dressing/Undressing Upper body dressing   What is the patient wearing?: Pull over shirt    Upper body assist Assist Level: Independent with assistive device    Lower Body Dressing/Undressing Lower body dressing      What is the  patient wearing?: Ace wrap/stump shrinker, Orthosis, Pants     Lower body assist Assist for lower body dressing: Independent with assitive device     Toileting Toileting    Toileting assist Assist for toileting: Independent with assistive device     Transfers Chair/bed transfer  Transfers assist     Chair/bed transfer assist level: Supervision/Verbal cueing     Locomotion Ambulation   Ambulation assist   Ambulation activity did not occur: Safety/medical concerns (R tibia wound, bilateral BKA, decreased balance/coordination, weakness)          Walk 10 feet activity   Assist  Walk 10 feet activity did not occur: Safety/medical concerns (R tibia wound, bilateral BKA, decreased balance/coordination, weakness)        Walk 50 feet activity   Assist Walk 50 feet with 2 turns activity did not occur: Safety/medical concerns (R tibia wound, bilateral BKA, decreased balance/coordination, weakness)          Walk 150 feet activity   Assist Walk 150 feet activity did not occur: Safety/medical concerns (R tibia wound, bilateral BKA, decreased balance/coordination, weakness)         Walk 10 feet on uneven surface  activity   Assist Walk 10 feet on uneven surfaces activity did not occur: Safety/medical concerns (R tibia wound, bilateral BKA, decreased balance/coordination, weakness)         Wheelchair     Assist Is the patient using a wheelchair?: Yes Type of Wheelchair: Manual    Wheelchair assist level: Independent Max wheelchair distance: 150'    Wheelchair 50 feet with 2 turns activity    Assist        Assist Level: Independent   Wheelchair 150 feet activity     Assist      Assist Level: Independent   Blood pressure (!) 142/70, pulse 62, temperature 98 F (36.7 C), resp. rate 17, height 5' 8 (1.727 m), weight 91.7 kg, SpO2 95%.  Medical Problem List and Plan: 1. Functional deficits secondary to L BKA in setting of old R BKA due to osteomyelitis and gangrene             -patient may not shower             -ELOS/Goals: 7-10 days supervision to mod I at w/c level Chart and therapy notes reviewed, continue CIR, discussed patient's progress with therapy Grounds pass ordered Vitamin D3/Metanx/Vitamin B/C complex ordered F/u with me or Fidela in clinic within 1 month Would benefit from home health aide upon discharge   D/c home today 2.  Antithrombotics: -DVT/anticoagulation:  Pharmaceutical: continue Lovenox              -antiplatelet therapy: ASA  3. Back pain: CT spine ordered to assess for discitis. Hydrocodone--->changed to tramadol  prn, continue             --Ketorolac  X 5 days thru thru 11/08. Monitor renal status/BP stopped Norco due to LFT elevation  4. Mood/Behavior/Sleep: LCSW to follow for evaluation and support.              -antipsychotic agents: N/A  5. Neuropsych/cognition: This patient is capable of making decisions on his own  behalf.  6. Skin/Wound Care: Routine pressure relief measures.  --Low carb protein supplements for wound healing.   7. Fluids/Electrolytes/Nutrition: Monitor I/O. Check CMET in am  8. Group B strep bactermia: Antibiotics changed to  PCN G             --MRI TL spine limited  by claustrophobia.             --2 d echo OK  continue IV Penicillin , can switch to amoxicillin  upon d/c as per ID  11/15- d/c Monday of note 9. T2DM: Hgb A1c 6.5 and well controlled on Insulin  glargine 40 units w/20 units lispro bid.  --Will monitor BS ac/hs and use SSI for elevated BS             --Increase basal insulin  to 30 units. D/c feeding supplement. Acarbose added-increase to 50mg  TID  Increase semglee  to 40U HS, increase novolog  to 11U with meals-conitnue  CBG (last 3)  Recent Labs    09/03/24 1653 09/03/24 2043 09/04/24 0527  GLUCAP 243* 159* 150*   10.  HTN: Monitor BP TID--continue amlodipine  10mg  daily, acarbose added as above for diabetes and can also help HTN  11/15- 11/16- BP controlled- con't regimen    09/04/2024    5:25 AM 09/03/2024    7:15 PM 09/03/2024    1:30 PM  Vitals with BMI  Systolic 142 126 867  Diastolic 70 66 69  Pulse 62 66 68     11. Abnormal LFTs: reviewed and continue to uptrend, d/c statin until they start to come down, repeat tomorrow.   - 11/10 alk phos, AST and ALT improved today, continue current regimen  11/15- LFTS 57/84- would recheck prior to d/c Monday - changed to check CMP Monday  12. ABLA: Hgb reviewed and is trending upward  13. Acute renal failure: Due to infection and has resolved.  Cr reviewed and is stable  14.  Hyponatremia/hyperkalemia: potassium reviewed and has normalized   >30 minutes spent in discharge of patient including review of medications and follow-up appointments, physical examination, and in answering all patient's questions      LOS: 11 days A FACE TO FACE EVALUATION WAS PERFORMED  Sven P Jailyne Chieffo 09/04/2024, 9:32 AM

## 2024-09-04 NOTE — Progress Notes (Signed)
 Inpatient Rehabilitation Care Coordinator Discharge Note   Patient Details  Name: Sean Carter MRN: [id number] Date of Birth: May 03, 1971   Discharge location: Home with s/o [place]  Length of Stay: 10 days  Discharge activity level: Independent with assistive device  Home/community participation: Active within the community  Patient response un:Yzjouy Literacy - How often do you need to have someone help you when you read instructions, pamphlets, or other written material from your doctor or pharmacy?: Never  Patient response un:Dnrpjo Isolation - How often do you feel lonely or isolated from those around you?: Never  Services provided included: MD, RD, PT, OT, SLP, RN, CM, TR, Pharmacy, SW  Financial Services:  Field Seismologist Utilized: Private Insurance [employer] / [employer] [facility]  Choices offered to/list presented to: Patient  Follow-up services arranged:  Outpatient [facility] - [address] [place], [place] [address]     Outpatient Servicies: PT/OT      Patient response to transportation need: Is the patient able to respond to transportation needs?: Yes In the past 12 months, has lack of transportation kept you from medical appointments or from getting medications?: No In the past 12 months, has lack of transportation kept you from meetings, work, or from getting things needed for daily living?: No   Patient/Family verbalized understanding of follow-up arrangements:  Yes  Individual responsible for coordination of the follow-up plan: Patient  Confirmed correct DME delivered: Sean  Lonnette Shrode 09/04/2024    Comments (or additional information): Patient to discharge home with wife who is aware of 24/7 care needs.   Summary of Stay    Date/Time Discharge Planning CSW  08/30/24 1540 Home with s/o. OP referral. DAC and 18x18 wheelchair recommended. DS  08/30/24 0927 Home with s/o. OP vs HH. Awaiting follow up recomendation. DS       Sean  Carter

## 2024-09-04 NOTE — Progress Notes (Signed)
 Inpatient Rehabilitation Discharge Medication Review by a Pharmacist  A complete drug regimen review was completed for this patient to identify any potential clinically significant medication issues.  High Risk Drug Classes Is patient taking? Indication by Medication  Antipsychotic No   Anticoagulant No   Antibiotic Yes Amoxicillin  - bacteremia  Opioid Yes Tramadol  - pain  Antiplatelet Yes Aspirin  - PAD  Hypoglycemics/insulin  Yes Humalog , Basaglar , Acarbose - DM  Vasoactive Medication Yes Amlodipine  - HTN  Chemotherapy No   Other Yes B - complex, Juven, Metanx, Vitamin D3 - supplements Cetirizine  - allergies Famotidine  - GERD Montelukast  - restrictive lung disease     Type of Medication Issue Identified Description of Issue Recommendation(s)  Drug Interaction(s) (clinically significant)     Duplicate Therapy     Allergy     No Medication Administration End Date     Incorrect Dose     Additional Drug Therapy Needed     Significant med changes from prior encounter (inform family/care partners about these prior to discharge). Atorvastatin  - discontinued with elevated LFTs, continuing to monitor labs   Hydrochlorothiazide , ropinirole  - discontinued Communicate relevant medication changes to patient/family members at discharge from CIR.   Other       Clinically significant medication issues were identified that warrant physician communication and completion of prescribed/recommended actions by midnight of the next day:  No  Name of provider notified for urgent issues identified:   Provider Method of Notification:     Pharmacist comments:   Time spent performing this drug regimen review (minutes):  [address], PharmD, [place], AAHIVP, CPP Infectious Disease Pharmacist 09/04/2024 7:22 AM

## 2024-09-04 NOTE — Progress Notes (Signed)
 [name], male   DOB: 01-10-1971, 53 y.o.   MRN: [id number]  Ordered B LE elevating leg rests via [employer]

## 2024-09-11 NOTE — Telephone Encounter (Signed)
 Appt scheduled

## 2024-09-19 ENCOUNTER — Other Ambulatory Visit: Payer: Self-pay | Admitting: Family Medicine

## 2024-09-20 ENCOUNTER — Telehealth: Payer: Self-pay | Admitting: Pharmacist

## 2024-09-20 MED ORDER — DEXCOM G7 SENSOR MISC: 11 refills | Status: DC

## 2024-09-20 NOTE — Telephone Encounter (Signed)
 Patient contacts office to request assistance with CGM (continuous glucose monitor) - new prescription.   Patient reports glucose control has been great following recent amputation.  Denies low readings.  New prescription for sensors provided.  Encouraged patient to schedule follow-up visit at [facility].   Total time with patient call and documentation of interaction: 9 minutes.

## 2024-09-20 NOTE — Telephone Encounter (Signed)
 Reviewed and agree with Dr Rennis plan.

## 2024-09-27 ENCOUNTER — Encounter: Payer: Self-pay | Admitting: Physician Assistant

## 2024-09-27 ENCOUNTER — Ambulatory Visit: Attending: Vascular Surgery

## 2024-09-27 VITALS — BP 145/88 | HR 83 | Temp 98.1°F

## 2024-09-27 DIAGNOSIS — Z89511 Acquired absence of right leg below knee: Secondary | ICD-10-CM

## 2024-09-27 DIAGNOSIS — Z89512 Acquired absence of left leg below knee: Secondary | ICD-10-CM

## 2024-09-27 DIAGNOSIS — I739 Peripheral vascular disease, unspecified: Secondary | ICD-10-CM

## 2024-09-27 NOTE — Progress Notes (Addendum)
 POST OPERATIVE OFFICE NOTE    CC:  F/u for surgery  HPI:  This is a 53 y.o. male who is s/p left below knee amputation on 08/22/24 by Dr. Sheree. He unfortunately had a non viable left foot. Has history of prior right BKA. This is well healed.  Pt returns today for follow up with his wife.  Pt states overall he is doing great. No pain in left BKA. No phantom pain. He does report right BKA prosthesis is not fitting well and he needs a revision. Otherwise no concerns.  Allergies  Allergen Reactions   Scallops [Shellfish Allergy] Anaphylaxis    Current Outpatient Medications  Medication Sig Dispense Refill   acarbose  (PRECOSE ) 50 MG tablet Take 1 tablet (50 mg total) by mouth 3 (three) times daily with meals. 90 tablet 0   amLODipine  (NORVASC ) 10 MG tablet Take 1 tablet (10 mg total) by mouth daily.     amoxicillin  (AMOXIL ) 500 MG capsule Take 2 capsules (1,000 mg total) by mouth 3 (three) times daily. 18 capsule 0   aspirin  EC 81 MG tablet Take 1 tablet (81 mg total) by mouth daily. Swallow whole.     B Complex-C (B-COMPLEX WITH VITAMIN C) tablet Take 1 tablet by mouth daily. 30 tablet 0   cetirizine  (ZYRTEC ) 10 MG tablet Take 10 mg by mouth daily.     vitamin D3 (CHOLECALCIFEROL ) 25 MCG tablet Take 2 tablets (2,000 Units total) by mouth daily. 60 tablet 0   Continuous Glucose Sensor (DEXCOM G7 SENSOR) MISC Apply new sensor every 10 days 3 each 11   famotidine  (PEPCID ) 40 MG tablet Take 1 tablet (40 mg total) by mouth 2 (two) times daily. 60 tablet 0   Insulin  Glargine (BASAGLAR  KWIKPEN) 100 UNIT/ML Inject 40 Units into the skin daily. 15 mL 0   insulin  lispro (HUMALOG ) 100 UNIT/ML KwikPen Inject 12 Units into the skin 3 (three) times daily. 15 mL 11   Insulin  Pen Needle 32G X 4 MM MISC Use 3 (three) times daily. 100 each 0   l-methylfolate-B6-B12 (METANX) 3-35-2 MG TABS tablet Take 1 tablet by mouth daily. 30 tablet 0   montelukast  (SINGULAIR ) 10 MG tablet Take 1 tablet (10 mg total) by  mouth at bedtime. 90 tablet 1   nutrition supplement, JUVEN, (JUVEN) PACK Take 1 packet by mouth 2 (two) times daily between meals. Purchase Juven from [employer] or local store or can use protein powder  as additional supplement.     traMADol  (ULTRAM ) 50 MG tablet Take 1 tablet (50 mg total) by mouth every 6 (six) hours as needed. 28 tablet 0   No current facility-administered medications for this visit.     ROS:  See HPI  Physical Exam:  Vitals:   09/27/24 0857  BP: (!) 145/88  Pulse: 83  Temp: 98.1 F (36.7 C)    General: well appearing, well nourished, in no distress Incision:  left BKA incision is intact and well appearing. Staples removed. A lot of dry scaly skin along incision like and posterior flap Neuro: alert and oriented   Assessment/Plan:  This is a 53 y.o. male who is s/p: left below knee amputation on 08/22/24 by Dr. Sheree. He unfortunately had a non viable left foot. Has history of prior right BKA. This is well healed. Left BKA healing well. Staples removed today. He is not having any pain. He can follow up with us  as needed. [facility]erral to [facility] for revision of right BKA prosthesis and new left  BKA prosthesis  -The patient has a bilateral Below Knee Amputation. The patient is well motivated to return to their prior functional status by utilizing a prosthesis to perform ADL's and maintain a healthy lifestyle. The patient has the physical and cognitive capacity to function with a prosthesis.   Functional Level: K3 Community Ambulator: Has the ability or potential for ambulation with variable cadence, to traverse most environmental barriers, and may have vocational, therapeutic, or exercise activity that demands prosthetic utilization beyond simple locomotion. Pt may benefit from human resources officer.   Residual Limb History: The skin condition of the residual limb is healing well. The patient will continue to monitor the skin of the residual limb and follow hygiene  instructions.  The patient is experiencing no pain related to amputation  Prosthetic Prescription Plan: Counseling and education regarding prosthetic management will be provided to the patient via a certified prosthetist. A multi-discipline team, including physical therapy, will manage the prosthetic fabrication, fitting and prosthetic gait training.     Curry Damme, [facility] [phone]   Clinic MD:  Sheree

## 2024-10-03 ENCOUNTER — Encounter: Admitting: Physical Medicine and Rehabilitation

## 2024-10-03 NOTE — Progress Notes (Deleted)
 Subjective:  Chief Complaint:   [name], male    DOB: 1971/09/03, 53 y.o.   MRN: [id number]  HPI  Past Medical History:  Diagnosis Date   Blurry vision 07/10/2020   Diabetes mellitus without complication ([name])    Gangrene of toe of left foot ([name])    Peripheral arterial disease    Sepsis due to Streptococcus, group B ([name]) 06/20/2020   Streptococcal bacteremia     Past Surgical History:  Procedure Laterality Date   ABDOMINAL AORTOGRAM W/LOWER EXTREMITY N/A 01/18/2023   Procedure: ABDOMINAL AORTOGRAM W/LOWER EXTREMITY;  Surgeon: Eliza Lonni RAMAN, MD;  Location: [facility] INVASIVE CV LAB;  Service: Cardiovascular;  Laterality: N/A;   AMPUTATION Left 05/31/2020   Procedure: Ray Amputation of Left Great Toe with Negative Pressure Vac Placement;  Surgeon: Gretta Lonni PARAS, MD;  Location: [facility] OR;  Service: Vascular;  Laterality: Left;   AMPUTATION Right 07/22/2022   Procedure: RIGHT FIRST AND SECOND RAY PARTIAL  AMPUTATION;  Surgeon: Magda Debby SAILOR, MD;  Location: [facility] OR;  Service: Vascular;  Laterality: Right;   AMPUTATION Right 09/02/2023   Procedure: AMPUTATION BELOW KNEE;  Surgeon: Serene Gaile ORN, MD;  Location: [facility] OR;  Service: Vascular;  Laterality: Right;   AMPUTATION Left 08/22/2024   Procedure: LEFT AMPUTATION BELOW KNEE;  Surgeon: Sheree Penne Lonni, MD;  Location: [facility] OR;  Service: Vascular;  Laterality: Left;   APPLICATION OF WOUND VAC Right 01/22/2023   Procedure: APPLICATION OF WOUND VAC, RIGHT FOOT;  Surgeon: Sheree Penne Lonni, MD;  Location: [facility] OR;  Service: Vascular;  Laterality: Right;   INCISION AND DRAINAGE OF WOUND Right 01/22/2023   Procedure: WASHOUT OF RIGHT FOOT WOUND;  Surgeon: Sheree Penne Lonni, MD;  Location: [facility] OR;  Service: Vascular;  Laterality: Right;   PERIPHERAL VASCULAR BALLOON ANGIOPLASTY  01/18/2023   Procedure: PERIPHERAL VASCULAR BALLOON ANGIOPLASTY;  Surgeon: Eliza Lonni RAMAN, MD;  Location: [facility] INVASIVE CV  LAB;  Service: Cardiovascular;;   STUMP REVISION Right 01/22/2023   Procedure: PARTIAL CLOSURE OF RIGHT FOOT WOUND;  Surgeon: Sheree Penne Lonni, MD;  Location: [facility] OR;  Service: Vascular;  Laterality: Right;   TRANSMETATARSAL AMPUTATION Right 01/19/2023   Procedure: TRANSMETATARSAL AMPUTATION;  Surgeon: Sheree Penne Lonni, MD;  Location: [facility] OR;  Service: Vascular;  Laterality: Right;    Family History  Problem Relation Age of Onset   Diabetes Father       Social History   Socioeconomic History   Marital status: Married    Spouse name: Not on file   Number of children: Not on file   Years of education: Not on file   Highest education level: 12th grade  Occupational History   Not on file  Tobacco Use   Smoking status: Never    Passive exposure: Never   Smokeless tobacco: Never  Vaping Use   Vaping status: Never Used  Substance and Sexual Activity   Alcohol  use: Not on file   Drug use: Never   Sexual activity: Not on file  Other Topics Concern   Not on file  Social History Narrative   Not on file   Social Drivers of Health   Tobacco Use: Low Risk (09/27/2024)   Patient History    Smoking Tobacco Use: Never    Smokeless Tobacco Use: Never    Passive Exposure: Never  Financial Resource Strain: High Risk (05/02/2024)   Overall Financial Resource Strain (CARDIA)    Difficulty of Paying Living Expenses: Hard  Food  Insecurity: No Food Insecurity (08/21/2024)   Epic    Worried About Programme Researcher, Broadcasting/film/video in the Last Year: Never true    Ran Out of Food in the Last Year: Never true  Transportation Needs: No Transportation Needs (08/21/2024)   Epic    Lack of Transportation (Medical): No    Lack of Transportation (Non-Medical): No  Physical Activity: Insufficiently Active (05/02/2024)   Exercise Vital Sign    Days of Exercise per Week: 2 days    Minutes of Exercise per Session: 30 min  Stress: No Stress Concern Present (05/02/2024)   [employer] of  Occupational Health - Occupational Stress Questionnaire    Feeling of Stress: Only a little  Social Connections: Moderately Integrated (05/02/2024)   Social Connection and Isolation Panel    Frequency of Communication with Friends and Family: More than three times a week    Frequency of Social Gatherings with Friends and Family: Once a week    Attends Religious Services: More than 4 times per year    Active Member of [employer] or Organizations: No    Attends Banker Meetings: Not on file    Marital Status: Living with partner  Depression (PHQ2-9): Low Risk (05/03/2024)   Depression (PHQ2-9)    PHQ-2 Score: 0  Alcohol  Screen: Not on file  Housing: High Risk (08/21/2024)   Epic    Unable to Pay for Housing in the Last Year: Yes    Number of Times Moved in the Last Year: 0    Homeless in the Last Year: No  Utilities: Not At Risk (08/21/2024)   Epic    Threatened with loss of utilities: No  Health Literacy: Not on file    Allergies[1]  Current Medications[2]   Review of Systems     Objective:   Physical Exam        Assessment & Plan:       [1]  Allergies Allergen Reactions   Scallops [Shellfish Allergy] Anaphylaxis  [2]  Current Outpatient Medications:    acarbose  (PRECOSE ) 50 MG tablet, Take 1 tablet (50 mg total) by mouth 3 (three) times daily with meals., Disp: 90 tablet, Rfl: 0   amLODipine  (NORVASC ) 10 MG tablet, Take 1 tablet (10 mg total) by mouth daily., Disp: , Rfl:    amoxicillin  (AMOXIL ) 500 MG capsule, Take 2 capsules (1,000 mg total) by mouth 3 (three) times daily., Disp: 18 capsule, Rfl: 0   aspirin  EC 81 MG tablet, Take 1 tablet (81 mg total) by mouth daily. Swallow whole., Disp: , Rfl:    B Complex-C (B-COMPLEX WITH VITAMIN C) tablet, Take 1 tablet by mouth daily., Disp: 30 tablet, Rfl: 0   cetirizine  (ZYRTEC ) 10 MG tablet, Take 10 mg by mouth daily., Disp: , Rfl:    vitamin D3 (CHOLECALCIFEROL ) 25 MCG tablet, Take 2 tablets (2,000 Units  total) by mouth daily., Disp: 60 tablet, Rfl: 0   Continuous Glucose Sensor (DEXCOM G7 SENSOR) MISC, Apply new sensor every 10 days, Disp: 3 each, Rfl: 11   famotidine  (PEPCID ) 40 MG tablet, Take 1 tablet (40 mg total) by mouth 2 (two) times daily., Disp: 60 tablet, Rfl: 0   Insulin  Glargine (BASAGLAR  KWIKPEN) 100 UNIT/ML, Inject 40 Units into the skin daily., Disp: 15 mL, Rfl: 0   insulin  lispro (HUMALOG ) 100 UNIT/ML KwikPen, Inject 12 Units into the skin 3 (three) times daily., Disp: 15 mL, Rfl: 11   Insulin  Pen Needle 32G X 4 MM MISC, Use 3 (three)  times daily., Disp: 100 each, Rfl: 0   l-methylfolate-B6-B12 (METANX) 3-35-2 MG TABS tablet, Take 1 tablet by mouth daily., Disp: 30 tablet, Rfl: 0   montelukast  (SINGULAIR ) 10 MG tablet, Take 1 tablet (10 mg total) by mouth at bedtime., Disp: 90 tablet, Rfl: 1   nutrition supplement, JUVEN, (JUVEN) PACK, Take 1 packet by mouth 2 (two) times daily between meals. Purchase Juven from [employer] or local store or can use protein powder  as additional supplement., Disp: , Rfl:    traMADol  (ULTRAM ) 50 MG tablet, Take 1 tablet (50 mg total) by mouth every 6 (six) hours as needed., Disp: 28 tablet, Rfl: 0

## 2024-10-04 ENCOUNTER — Ambulatory Visit: Admitting: Infectious Disease

## 2024-10-04 ENCOUNTER — Encounter: Admitting: Physical Medicine and Rehabilitation

## 2024-10-04 ENCOUNTER — Other Ambulatory Visit: Payer: Self-pay

## 2024-10-04 ENCOUNTER — Other Ambulatory Visit: Payer: Self-pay | Admitting: Student

## 2024-10-04 ENCOUNTER — Telehealth: Payer: Self-pay

## 2024-10-04 DIAGNOSIS — A401 Sepsis due to streptococcus, group B: Secondary | ICD-10-CM

## 2024-10-04 DIAGNOSIS — R0982 Postnasal drip: Secondary | ICD-10-CM

## 2024-10-04 DIAGNOSIS — I152 Hypertension secondary to endocrine disorders: Secondary | ICD-10-CM | POA: Diagnosis not present

## 2024-10-04 DIAGNOSIS — S88112S Complete traumatic amputation at level between knee and ankle, left lower leg, sequela: Secondary | ICD-10-CM

## 2024-10-04 DIAGNOSIS — E1159 Type 2 diabetes mellitus with other circulatory complications: Secondary | ICD-10-CM | POA: Diagnosis not present

## 2024-10-04 DIAGNOSIS — R198 Other specified symptoms and signs involving the digestive system and abdomen: Secondary | ICD-10-CM | POA: Diagnosis not present

## 2024-10-04 DIAGNOSIS — M86372 Chronic multifocal osteomyelitis, left ankle and foot: Secondary | ICD-10-CM | POA: Diagnosis not present

## 2024-10-04 DIAGNOSIS — S88112A Complete traumatic amputation at level between knee and ankle, left lower leg, initial encounter: Secondary | ICD-10-CM | POA: Diagnosis not present

## 2024-10-04 DIAGNOSIS — M869 Osteomyelitis, unspecified: Secondary | ICD-10-CM | POA: Diagnosis not present

## 2024-10-04 DIAGNOSIS — Z794 Long term (current) use of insulin: Secondary | ICD-10-CM | POA: Diagnosis not present

## 2024-10-04 DIAGNOSIS — Z89431 Acquired absence of right foot: Secondary | ICD-10-CM | POA: Diagnosis not present

## 2024-10-04 DIAGNOSIS — I96 Gangrene, not elsewhere classified: Secondary | ICD-10-CM

## 2024-10-04 DIAGNOSIS — J984 Other disorders of lung: Secondary | ICD-10-CM | POA: Diagnosis not present

## 2024-10-04 DIAGNOSIS — E1151 Type 2 diabetes mellitus with diabetic peripheral angiopathy without gangrene: Secondary | ICD-10-CM

## 2024-10-04 DIAGNOSIS — Z89511 Acquired absence of right leg below knee: Secondary | ICD-10-CM | POA: Diagnosis not present

## 2024-10-04 MED ORDER — JUVEN PO PACK: 1.0000 | PACK | Freq: Two times a day (BID) | ORAL | 3 refills | Status: AC

## 2024-10-04 MED ORDER — FAMOTIDINE 40 MG PO TABS: 40.0000 mg | ORAL_TABLET | Freq: Two times a day (BID) | ORAL | 0 refills | Status: AC

## 2024-10-04 MED ORDER — ASPIRIN 81 MG PO TBEC: 81.0000 mg | DELAYED_RELEASE_TABLET | Freq: Every day | ORAL | 3 refills | Status: AC

## 2024-10-04 MED ORDER — BASAGLAR KWIKPEN 100 UNIT/ML ~~LOC~~ SOPN: 40.0000 [IU] | PEN_INJECTOR | Freq: Every day | SUBCUTANEOUS | 0 refills | Status: AC

## 2024-10-04 MED ORDER — INSULIN PEN NEEDLE 32G X 4 MM MISC: 1.0000 | Freq: Three times a day (TID) | 0 refills | Status: AC

## 2024-10-04 MED ORDER — INSULIN LISPRO (1 UNIT DIAL) 100 UNIT/ML (KWIKPEN): 12.0000 [IU] | PEN_INJECTOR | Freq: Three times a day (TID) | SUBCUTANEOUS | 11 refills | Status: AC

## 2024-10-04 MED ORDER — ACARBOSE 50 MG PO TABS: 50.0000 mg | ORAL_TABLET | Freq: Three times a day (TID) | ORAL | 0 refills | Status: DC

## 2024-10-04 MED ORDER — AMLODIPINE BESYLATE 10 MG PO TABS: 10.0000 mg | ORAL_TABLET | Freq: Every day | ORAL | 3 refills | Status: AC

## 2024-10-04 MED ORDER — MONTELUKAST SODIUM 10 MG PO TABS: 10.0000 mg | ORAL_TABLET | Freq: Every day | ORAL | 1 refills | Status: DC

## 2024-10-04 NOTE — Progress Notes (Signed)
° °  Subjective:    [name], male    DOB: [birth year], 53 y.o.   MRN: [id number]  HPI An audio/video tele-health visit is felt to be the most appropriate encounter for this patient at this time. This is a follow up tele-visit via phone. The patient is at home. MD is at office. Prior to scheduling this appointment, our staff discussed the limitations of evaluation and management by telemedicine and the availability of in-person appointments. The patient expressed understanding and agreed to proceed.   1) Left BKA -patient needs refills of all his medications today -he has been doing well at home -he was not able to make his in-person appointment here yesterday and has rescheduled this       Objective:   Physical Exam Not performed       Assessment & Plan:   1) L BKA: -refills sent for his current medications with the exception of antibiotics and tramadol  -asked medical assistant to let patient know he will need pain contract in order to received tramadol  at this clinic and to please follow-up with ID for antibiotic refills if these are still needed -refilled Juven  2) HTN: refilled amlodipine

## 2024-10-04 NOTE — Telephone Encounter (Signed)
 Good morning Sean Carter! I sent refills for this patient today. Would you mind letting him know I did not send tramadol . As he would need to do a pain contract with us , for me to send. The antibiotics he should receive from ID. If still needed- all others sent!   The above reply has been given to [name]. Patient understood.  Task completed.

## 2024-10-05 ENCOUNTER — Other Ambulatory Visit: Payer: Self-pay

## 2024-10-05 ENCOUNTER — Telehealth: Payer: Self-pay

## 2024-10-05 DIAGNOSIS — E782 Mixed hyperlipidemia: Secondary | ICD-10-CM

## 2024-10-05 DIAGNOSIS — R7989 Other specified abnormal findings of blood chemistry: Secondary | ICD-10-CM

## 2024-10-05 DIAGNOSIS — R0982 Postnasal drip: Secondary | ICD-10-CM

## 2024-10-05 MED ORDER — ATORVASTATIN CALCIUM 40 MG PO TABS: 40.0000 mg | ORAL_TABLET | Freq: Every day | ORAL | 3 refills | Status: AC

## 2024-10-05 MED ORDER — MONTELUKAST SODIUM 10 MG PO TABS: 10.0000 mg | ORAL_TABLET | Freq: Every day | ORAL | 1 refills | Status: AC

## 2024-10-05 NOTE — Addendum Note (Signed)
 Addended by: HOWELL LUNGER on: 10/05/2024 12:02 PM   Modules accepted: Orders

## 2024-10-05 NOTE — Telephone Encounter (Signed)
 Fax from [facility] for ATORVASTATIN  40 MG TAB. Dont see on current medication list. If you want patient to start this medication. Please send Rx to pharmacy with duration, dose, and instructions. Nelson Land, CMA

## 2024-10-06 ENCOUNTER — Encounter: Admitting: Physical Medicine and Rehabilitation

## 2024-10-16 ENCOUNTER — Encounter: Admitting: Physical Therapy

## 2024-10-17 NOTE — Telephone Encounter (Signed)
 Spoke with patient. Made appt for lab only visit for Dec 31st at 11:00 future orders. Nelson Land, CMA

## 2024-10-18 ENCOUNTER — Encounter: Payer: Self-pay | Admitting: Registered Nurse

## 2024-10-18 ENCOUNTER — Other Ambulatory Visit: Payer: Self-pay

## 2024-10-18 NOTE — Progress Notes (Deleted)
 "  Subjective:    [name], male    DOB: [birth year], 53 y.o.   MRN: [id number]  HPI   Pain Inventory Average Pain {NUMBERS; 0-10:5044} Pain Right Now {NUMBERS; 0-10:5044} My pain is {PAIN DESCRIPTION:21022940}  In the last 24 hours, has pain interfered with the following? General activity {NUMBERS; 0-10:5044} Relation with others {NUMBERS; 0-10:5044} Enjoyment of life {NUMBERS; 0-10:5044} What TIME of day is your pain at its worst? {time of day:24191} Sleep (in general) {BHH GOOD/FAIR/POOR:22877}  Pain is worse with: {ACTIVITIES:21022942} Pain improves with: {PAIN IMPROVES TPUY:78977056} Relief from Meds: {NUMBERS; 0-10:5044}  Family History  Problem Relation Age of Onset   Diabetes Father    Social History   Socioeconomic History   Marital status: Married    Spouse name: Not on file   Number of children: Not on file   Years of education: Not on file   Highest education level: 12th grade  Occupational History   Not on file  Tobacco Use   Smoking status: Never    Passive exposure: Never   Smokeless tobacco: Never  Vaping Use   Vaping status: Never Used  Substance and Sexual Activity   Alcohol  use: Not on file   Drug use: Never   Sexual activity: Not on file  Other Topics Concern   Not on file  Social History Narrative   Not on file   Social Drivers of Health   Tobacco Use: Low Risk (09/27/2024)   Patient History    Smoking Tobacco Use: Never    Smokeless Tobacco Use: Never    Passive Exposure: Never  Financial Resource Strain: High Risk (05/02/2024)   Overall Financial Resource Strain (CARDIA)    Difficulty of Paying Living Expenses: Hard  Food Insecurity: No Food Insecurity (08/21/2024)   Epic    Worried About Programme Researcher, Broadcasting/film/video in the Last Year: Never true    Ran Out of Food in the Last Year: Never true  Transportation Needs: No Transportation Needs (08/21/2024)   Epic    Lack of Transportation (Medical): No    Lack of  Transportation (Non-Medical): No  Physical Activity: Insufficiently Active (05/02/2024)   Exercise Vital Sign    Days of Exercise per Week: 2 days    Minutes of Exercise per Session: 30 min  Stress: No Stress Concern Present (05/02/2024)   [employer] of Occupational Health - Occupational Stress Questionnaire    Feeling of Stress: Only a little  Social Connections: Moderately Integrated (05/02/2024)   Social Connection and Isolation Panel    Frequency of Communication with Friends and Family: More than three times a week    Frequency of Social Gatherings with Friends and Family: Once a week    Attends Religious Services: More than 4 times per year    Active Member of [employer] or Organizations: No    Attends Banker Meetings: Not on file    Marital Status: Living with partner  Depression (PHQ2-9): Low Risk (05/03/2024)   Depression (PHQ2-9)    PHQ-2 Score: 0  Alcohol  Screen: Not on file  Housing: High Risk (08/21/2024)   Epic    Unable to Pay for Housing in the Last Year: Yes    Number of Times Moved in the Last Year: 0    Homeless in the Last Year: No  Utilities: Not At Risk (08/21/2024)   Epic    Threatened with loss of utilities: No  Health Literacy: Not on file   Past Surgical History:  Procedure Laterality Date   ABDOMINAL AORTOGRAM W/LOWER EXTREMITY N/A 01/18/2023   Procedure: ABDOMINAL AORTOGRAM W/LOWER EXTREMITY;  Surgeon: Eliza Lonni RAMAN, MD;  Location: [facility] INVASIVE CV LAB;  Service: Cardiovascular;  Laterality: N/A;   AMPUTATION Left 05/31/2020   Procedure: Ray Amputation of Left Great Toe with Negative Pressure Vac Placement;  Surgeon: Gretta Lonni PARAS, MD;  Location: [facility] OR;  Service: Vascular;  Laterality: Left;   AMPUTATION Right 07/22/2022   Procedure: RIGHT FIRST AND SECOND RAY PARTIAL  AMPUTATION;  Surgeon: Magda Debby SAILOR, MD;  Location: [facility] OR;  Service: Vascular;  Laterality: Right;   AMPUTATION Right 09/02/2023   Procedure: AMPUTATION BELOW  KNEE;  Surgeon: Serene Gaile ORN, MD;  Location: [facility] OR;  Service: Vascular;  Laterality: Right;   AMPUTATION Left 08/22/2024   Procedure: LEFT AMPUTATION BELOW KNEE;  Surgeon: Sheree Penne Lonni, MD;  Location: [facility] OR;  Service: Vascular;  Laterality: Left;   APPLICATION OF WOUND VAC Right 01/22/2023   Procedure: APPLICATION OF WOUND VAC, RIGHT FOOT;  Surgeon: Sheree Penne Lonni, MD;  Location: [facility];  Service: Vascular;  Laterality: Right;   INCISION AND DRAINAGE OF WOUND Right 01/22/2023   Procedure: WASHOUT OF RIGHT FOOT WOUND;  Surgeon: Sheree Penne Lonni, MD;  Location: [facility] OR;  Service: Vascular;  Laterality: Right;   PERIPHERAL VASCULAR BALLOON ANGIOPLASTY  01/18/2023   Procedure: PERIPHERAL VASCULAR BALLOON ANGIOPLASTY;  Surgeon: Eliza Lonni RAMAN, MD;  Location: [facility] INVASIVE CV LAB;  Service: Cardiovascular;;   STUMP REVISION Right 01/22/2023   Procedure: PARTIAL CLOSURE OF RIGHT FOOT WOUND;  Surgeon: Sheree Penne Lonni, MD;  Location: [facility] OR;  Service: Vascular;  Laterality: Right;   TRANSMETATARSAL AMPUTATION Right 01/19/2023   Procedure: TRANSMETATARSAL AMPUTATION;  Surgeon: Sheree Penne Lonni, MD;  Location: [facility] OR;  Service: Vascular;  Laterality: Right;   Past Surgical History:  Procedure Laterality Date   ABDOMINAL AORTOGRAM W/LOWER EXTREMITY N/A 01/18/2023   Procedure: ABDOMINAL AORTOGRAM W/LOWER EXTREMITY;  Surgeon: Eliza Lonni RAMAN, MD;  Location: [facility] INVASIVE CV LAB;  Service: Cardiovascular;  Laterality: N/A;   AMPUTATION Left 05/31/2020   Procedure: Ray Amputation of Left Great Toe with Negative Pressure Vac Placement;  Surgeon: Gretta Lonni PARAS, MD;  Location: [facility] OR;  Service: Vascular;  Laterality: Left;   AMPUTATION Right 07/22/2022   Procedure: RIGHT FIRST AND SECOND RAY PARTIAL  AMPUTATION;  Surgeon: Magda Debby SAILOR, MD;  Location: [facility] OR;  Service: Vascular;  Laterality: Right;   AMPUTATION Right 09/02/2023   Procedure: AMPUTATION BELOW  KNEE;  Surgeon: Serene Gaile ORN, MD;  Location: [facility] OR;  Service: Vascular;  Laterality: Right;   AMPUTATION Left 08/22/2024   Procedure: LEFT AMPUTATION BELOW KNEE;  Surgeon: Sheree Penne Lonni, MD;  Location: [facility] OR;  Service: Vascular;  Laterality: Left;   APPLICATION OF WOUND VAC Right 01/22/2023   Procedure: APPLICATION OF WOUND VAC, RIGHT FOOT;  Surgeon: Sheree Penne Lonni, MD;  Location: [facility] OR;  Service: Vascular;  Laterality: Right;   INCISION AND DRAINAGE OF WOUND Right 01/22/2023   Procedure: WASHOUT OF RIGHT FOOT WOUND;  Surgeon: Sheree Penne Lonni, MD;  Location: [facility] OR;  Service: Vascular;  Laterality: Right;   PERIPHERAL VASCULAR BALLOON ANGIOPLASTY  01/18/2023   Procedure: PERIPHERAL VASCULAR BALLOON ANGIOPLASTY;  Surgeon: Eliza Lonni RAMAN, MD;  Location: [facility] INVASIVE CV LAB;  Service: Cardiovascular;;   STUMP REVISION Right 01/22/2023   Procedure: PARTIAL CLOSURE OF RIGHT FOOT WOUND;  Surgeon: Sheree Penne Lonni, MD;  Location: [facility] OR;  Service: Vascular;  Laterality: Right;   TRANSMETATARSAL AMPUTATION Right 01/19/2023   Procedure: TRANSMETATARSAL AMPUTATION;  Surgeon: Sheree Penne Bruckner, MD;  Location: [facility] OR;  Service: Vascular;  Laterality: Right;   Past Medical History:  Diagnosis Date   Blurry vision 07/10/2020   Diabetes mellitus without complication ([name])    Gangrene of toe of left foot ([name])    Peripheral arterial disease    Sepsis due to Streptococcus, group B ([name]) 06/20/2020   Streptococcal bacteremia    There were no vitals taken for this visit.  Opioid Risk Score:   Fall Risk Score:  `1  Depression screen [facility] 2/9     05/03/2024    8:40 AM 08/30/2023    8:41 AM 08/26/2023    2:12 PM 06/25/2023    3:46 PM 02/17/2023   10:29 AM 02/04/2023    2:09 PM 01/29/2023    8:55 AM  Depression screen PHQ 2/9  Decreased Interest  0 0 0 0 0 0  Down, Depressed, Hopeless 0 0 0 0 0 0 0  PHQ - 2 Score 0 0 0 0 0 0 0  Altered sleeping 0 0 0 0 0 0 0   Tired, decreased energy 0 0 0 0 0 0 0  Change in appetite 0 0 0 0 0 0 0  Feeling bad or failure about yourself  0 0 0 0 0 0 0  Trouble concentrating 0 0 0 0 0 0 0  Moving slowly or fidgety/restless 0 0 0 0 0 0 0  Suicidal thoughts 0 0 0 0 0 0 0  PHQ-9 Score 0  0  0  0  0  0  0   Difficult doing work/chores Not difficult at all           Data saved with a previous flowsheet row definition    Review of Systems     Objective:   Physical Exam        Assessment & Plan:    "

## 2024-10-23 ENCOUNTER — Other Ambulatory Visit

## 2024-10-23 NOTE — Progress Notes (Deleted)
 "  Subjective:  Chief Complaint:   [name], male    DOB: Jan 03, 1971, 54 y.o.   MRN: [id number]  HPI  Past Medical History:  Diagnosis Date   Blurry vision 07/10/2020   Diabetes mellitus without complication ([name])    Gangrene of toe of left foot ([name])    Peripheral arterial disease    Sepsis due to Streptococcus, group B ([name]) 06/20/2020   Streptococcal bacteremia     Past Surgical History:  Procedure Laterality Date   ABDOMINAL AORTOGRAM W/LOWER EXTREMITY N/A 01/18/2023   Procedure: ABDOMINAL AORTOGRAM W/LOWER EXTREMITY;  Surgeon: Eliza Lonni RAMAN, MD;  Location: [facility] INVASIVE CV LAB;  Service: Cardiovascular;  Laterality: N/A;   AMPUTATION Left 05/31/2020   Procedure: Ray Amputation of Left Great Toe with Negative Pressure Vac Placement;  Surgeon: Gretta Lonni PARAS, MD;  Location: [facility] OR;  Service: Vascular;  Laterality: Left;   AMPUTATION Right 07/22/2022   Procedure: RIGHT FIRST AND SECOND RAY PARTIAL  AMPUTATION;  Surgeon: Magda Debby SAILOR, MD;  Location: [facility] OR;  Service: Vascular;  Laterality: Right;   AMPUTATION Right 09/02/2023   Procedure: AMPUTATION BELOW KNEE;  Surgeon: Serene Gaile ORN, MD;  Location: [facility] OR;  Service: Vascular;  Laterality: Right;   AMPUTATION Left 08/22/2024   Procedure: LEFT AMPUTATION BELOW KNEE;  Surgeon: Sheree Penne Lonni, MD;  Location: [facility] OR;  Service: Vascular;  Laterality: Left;   APPLICATION OF WOUND VAC Right 01/22/2023   Procedure: APPLICATION OF WOUND VAC, RIGHT FOOT;  Surgeon: Sheree Penne Lonni, MD;  Location: [facility] OR;  Service: Vascular;  Laterality: Right;   INCISION AND DRAINAGE OF WOUND Right 01/22/2023   Procedure: WASHOUT OF RIGHT FOOT WOUND;  Surgeon: Sheree Penne Lonni, MD;  Location: [facility] OR;  Service: Vascular;  Laterality: Right;   PERIPHERAL VASCULAR BALLOON ANGIOPLASTY  01/18/2023   Procedure: PERIPHERAL VASCULAR BALLOON ANGIOPLASTY;  Surgeon: Eliza Lonni RAMAN, MD;  Location:  [facility] INVASIVE CV LAB;  Service: Cardiovascular;;   STUMP REVISION Right 01/22/2023   Procedure: PARTIAL CLOSURE OF RIGHT FOOT WOUND;  Surgeon: Sheree Penne Lonni, MD;  Location: [facility] OR;  Service: Vascular;  Laterality: Right;   TRANSMETATARSAL AMPUTATION Right 01/19/2023   Procedure: TRANSMETATARSAL AMPUTATION;  Surgeon: Sheree Penne Lonni, MD;  Location: [facility] OR;  Service: Vascular;  Laterality: Right;    Family History  Problem Relation Age of Onset   Diabetes Father       Social History   Socioeconomic History   Marital status: Married    Spouse name: Not on file   Number of children: Not on file   Years of education: Not on file   Highest education level: 12th grade  Occupational History   Not on file  Tobacco Use   Smoking status: Never    Passive exposure: Never   Smokeless tobacco: Never  Vaping Use   Vaping status: Never Used  Substance and Sexual Activity   Alcohol  use: Not on file   Drug use: Never   Sexual activity: Not on file  Other Topics Concern   Not on file  Social History Narrative   Not on file   Social Drivers of Health   Tobacco Use: Low Risk (09/27/2024)   Patient History    Smoking Tobacco Use: Never    Smokeless Tobacco Use: Never    Passive Exposure: Never  Financial Resource Strain: High Risk (05/02/2024)   Overall Financial Resource Strain (CARDIA)    Difficulty of Paying Living Expenses: Hard  Food Insecurity: No Food Insecurity (08/21/2024)   Epic    Worried About Programme Researcher, Broadcasting/film/video in the Last Year: Never true    Ran Out of Food in the Last Year: Never true  Transportation Needs: No Transportation Needs (08/21/2024)   Epic    Lack of Transportation (Medical): No    Lack of Transportation (Non-Medical): No  Physical Activity: Insufficiently Active (05/02/2024)   Exercise Vital Sign    Days of Exercise per Week: 2 days    Minutes of Exercise per Session: 30 min  Stress: No Stress Concern Present  (05/02/2024)   [employer] of Occupational Health - Occupational Stress Questionnaire    Feeling of Stress: Only a little  Social Connections: Moderately Integrated (05/02/2024)   Social Connection and Isolation Panel    Frequency of Communication with Friends and Family: More than three times a week    Frequency of Social Gatherings with Friends and Family: Once a week    Attends Religious Services: More than 4 times per year    Active Member of [employer] or Organizations: No    Attends Banker Meetings: Not on file    Marital Status: Living with partner  Depression (PHQ2-9): Low Risk (05/03/2024)   Depression (PHQ2-9)    PHQ-2 Score: 0  Alcohol  Screen: Not on file  Housing: High Risk (08/21/2024)   Epic    Unable to Pay for Housing in the Last Year: Yes    Number of Times Moved in the Last Year: 0    Homeless in the Last Year: No  Utilities: Not At Risk (08/21/2024)   Epic    Threatened with loss of utilities: No  Health Literacy: Not on file    Allergies[1]  Current Medications[2]   Review of Systems     Objective:   Physical Exam        Assessment & Plan:          [1] Allergies Allergen Reactions   Scallops [Shellfish Allergy] Anaphylaxis  [2]  Current Outpatient Medications:    acarbose  (PRECOSE ) 50 MG tablet, Take 1 tablet (50 mg total) by mouth 3 (three) times daily with meals., Disp: 90 tablet, Rfl: 0   amLODipine  (NORVASC ) 10 MG tablet, Take 1 tablet (10 mg total) by mouth daily., Disp: 90 tablet, Rfl: 3   amoxicillin  (AMOXIL ) 500 MG capsule, Take 2 capsules (1,000 mg total) by mouth 3 (three) times daily., Disp: 18 capsule, Rfl: 0   aspirin  EC 81 MG tablet, Take 1 tablet (81 mg total) by mouth daily. Swallow whole., Disp: 90 tablet, Rfl: 3   atorvastatin  (LIPITOR) 40 MG tablet, Take 1 tablet (40 mg total) by mouth daily., Disp: 90 tablet, Rfl: 3   B Complex-C (B-COMPLEX WITH VITAMIN C) tablet, Take 1 tablet by mouth  daily., Disp: 30 tablet, Rfl: 0   cetirizine  (ZYRTEC ) 10 MG tablet, Take 10 mg by mouth daily., Disp: , Rfl:    vitamin D3 (CHOLECALCIFEROL ) 25 MCG tablet, Take 2 tablets (2,000 Units total) by mouth daily., Disp: 60 tablet, Rfl: 0   Continuous Glucose Sensor (DEXCOM G7 SENSOR) MISC, Apply new sensor every 10 days, Disp: 3 each, Rfl: 11   famotidine  (PEPCID ) 40 MG tablet, Take 1 tablet (40 mg total) by mouth 2 (two) times daily., Disp: 60 tablet, Rfl: 0   hydrochlorothiazide  (HYDRODIURIL ) 25 MG tablet, Take 1 tablet by mouth once daily, Disp: 30 tablet, Rfl: 0   Insulin  Glargine (BASAGLAR  KWIKPEN) 100 UNIT/ML, Inject 40 Units  into the skin daily., Disp: 15 mL, Rfl: 0   insulin  lispro (HUMALOG ) 100 UNIT/ML KwikPen, Inject 12 Units into the skin 3 (three) times daily., Disp: 15 mL, Rfl: 11   Insulin  Pen Needle 32G X 4 MM MISC, Use 3 (three) times daily., Disp: 100 each, Rfl: 0   l-methylfolate-B6-B12 (METANX) 3-35-2 MG TABS tablet, Take 1 tablet by mouth daily., Disp: 30 tablet, Rfl: 0   montelukast  (SINGULAIR ) 10 MG tablet, Take 1 tablet (10 mg total) by mouth at bedtime., Disp: 90 tablet, Rfl: 1   nutrition supplement, JUVEN, (JUVEN) PACK, Take 1 packet by mouth 2 (two) times daily between meals. Purchase Juven from [employer] or local store or can use protein powder  as additional supplement., Disp: 30 each, Rfl: 3   traMADol  (ULTRAM ) 50 MG tablet, Take 1 tablet (50 mg total) by mouth every 6 (six) hours as needed., Disp: 28 tablet, Rfl: 0 "

## 2024-10-24 NOTE — Therapy (Unsigned)
 "  OUTPATIENT PHYSICAL THERAPY PROSTHETICS EVALUATION   Patient Name: Sean Carter MRN: [id number] DOB:1970-11-05, 54 y.o., male Today's Date: 10/24/2024  PCP: Howell Lunger, DO  REFERRING PROVIDER: Maurice Sharlet RAMAN, PA-C  END OF SESSION:   Past Medical History:  Diagnosis Date   Blurry vision 07/10/2020   Diabetes mellitus without complication ([name])    Gangrene of toe of left foot ([name])    Peripheral arterial disease    Sepsis due to Streptococcus, group B ([name]) 06/20/2020   Streptococcal bacteremia    Past Surgical History:  Procedure Laterality Date   ABDOMINAL AORTOGRAM W/LOWER EXTREMITY N/A 01/18/2023   Procedure: ABDOMINAL AORTOGRAM W/LOWER EXTREMITY;  Surgeon: Eliza Lonni RAMAN, MD;  Location: [facility] INVASIVE CV LAB;  Service: Cardiovascular;  Laterality: N/A;   AMPUTATION Left 05/31/2020   Procedure: Ray Amputation of Left Great Toe with Negative Pressure Vac Placement;  Surgeon: Gretta Lonni PARAS, MD;  Location: [facility] OR;  Service: Vascular;  Laterality: Left;   AMPUTATION Right 07/22/2022   Procedure: RIGHT FIRST AND SECOND RAY PARTIAL  AMPUTATION;  Surgeon: Magda Debby SAILOR, MD;  Location: [facility] OR;  Service: Vascular;  Laterality: Right;   AMPUTATION Right 09/02/2023   Procedure: AMPUTATION BELOW KNEE;  Surgeon: Serene Gaile ORN, MD;  Location: [facility] OR;  Service: Vascular;  Laterality: Right;   AMPUTATION Left 08/22/2024   Procedure: LEFT AMPUTATION BELOW KNEE;  Surgeon: Sheree Penne Lonni, MD;  Location: [facility] OR;  Service: Vascular;  Laterality: Left;   APPLICATION OF WOUND VAC Right 01/22/2023   Procedure: APPLICATION OF WOUND VAC, RIGHT FOOT;  Surgeon: Sheree Penne Lonni, MD;  Location: [facility] OR;  Service: Vascular;  Laterality: Right;   INCISION AND DRAINAGE OF WOUND Right 01/22/2023   Procedure: WASHOUT OF RIGHT FOOT WOUND;  Surgeon: Sheree Penne Lonni, MD;  Location: [facility] OR;  Service: Vascular;  Laterality: Right;   PERIPHERAL VASCULAR BALLOON ANGIOPLASTY   01/18/2023   Procedure: PERIPHERAL VASCULAR BALLOON ANGIOPLASTY;  Surgeon: Eliza Lonni RAMAN, MD;  Location: [facility] INVASIVE CV LAB;  Service: Cardiovascular;;   STUMP REVISION Right 01/22/2023   Procedure: PARTIAL CLOSURE OF RIGHT FOOT WOUND;  Surgeon: Sheree Penne Lonni, MD;  Location: [facility] OR;  Service: Vascular;  Laterality: Right;   TRANSMETATARSAL AMPUTATION Right 01/19/2023   Procedure: TRANSMETATARSAL AMPUTATION;  Surgeon: Sheree Penne Lonni, MD;  Location: [facility] OR;  Service: Vascular;  Laterality: Right;   Patient Active Problem List   Diagnosis Date Noted   Prerenal azotemia 09/04/2024   Coping style affecting medical condition 08/29/2024   Left foot infection 08/24/2024   Below-knee amputation of left lower extremity ([name]) 08/24/2024   Sepsis due to Streptococcus agalactiae ([name]) 08/22/2024   Foot osteomyelitis, left s/p BKA ([name]) 08/21/2024   Chronic health problem 08/21/2024   Anemia 08/21/2024   Osteomyelitis of left foot ([name]) 08/21/2024   S/P BKA (below knee amputation) unilateral, right ([name]) 09/07/2023   Wound infection 09/03/2023   Type 2 diabetes mellitus ([name]) 09/02/2023   Elevated LFTs 09/02/2023   Normocytic anemia 09/02/2023   Subacute cough 09/02/2023   Sepsis with acute organ dysfunction without septic shock ([name]) 09/01/2023   Restless legs syndrome 06/25/2023   Hyperkalemia 02/18/2023   S/P transmetatarsal amputation of foot, right ([name]) 01/31/2023   Transaminitis 01/24/2023   Diabetic infection of right foot ([name]) 01/16/2023   Open wound of plantar aspect of foot 01/16/2023   Restrictive lung disease 01/15/2023   History of wheezing 08/28/2022   Symptoms of gastroesophageal reflux 08/28/2022  Leukocytosis 07/20/2022   Diabetic retinopathy ([name]) 06/29/2022   Mixed hyperlipidemia 11/20/2021   Blurry vision 07/10/2020   Superficial vein thrombosis 06/06/2020   Hypertension associated with diabetes ([name]) 06/06/2020   Diabetes mellitus type 2 with  peripheral artery disease ([name]) 06/06/2020   Amputation of left great toe    Sepsis with streptococcus agalactiae ([name])    Hyponatremia 05/30/2020   Hypokalemia 05/30/2020    ONSET DATE: ***  REFERRING DIAG: D11.887I (ICD-10-CM) - Below-knee amputation of left lower extremity, subsequent encounter   THERAPY DIAG:  No diagnosis found.  Rationale for Evaluation and Treatment: Rehabilitation  SUBJECTIVE:   SUBJECTIVE STATEMENT: This 54yo male underwent left Transtibial Amputation on 08/22/2024. He has history of right Transtibial Amputation on 09/02/2023.  Pt accompanied by: {accompnied:27141}  PERTINENT HISTORY: DM2, PAD,   PAIN:  Are you having pain? {OPRCPAIN:27236}  PRECAUTIONS: {Therapy precautions:24002}  RED FLAGS: {PT Red Flags:29287}   WEIGHT BEARING RESTRICTIONS: {Yes ***/No:24003}  FALLS: Has patient fallen in last 6 months? {fallsyesno:27318}  LIVING ENVIRONMENT: Lives with: {OPRC lives with:25569::lives with their family} Lives in: {Lives in:25570} Home Access: {HOMEACCESS:26869} Home layout: {Home Layout:26870} Stairs: {opstairs:27293} Has following equipment at home: {Assistive devices:23999}  PLOF: {PLOF:24004}  PATIENT GOALS: ***  "

## 2024-10-25 ENCOUNTER — Ambulatory Visit: Payer: Self-pay

## 2024-10-25 ENCOUNTER — Ambulatory Visit: Admitting: Infectious Disease

## 2024-10-25 ENCOUNTER — Telehealth: Payer: Self-pay | Admitting: Pharmacist

## 2024-10-25 ENCOUNTER — Other Ambulatory Visit: Payer: Self-pay

## 2024-10-25 DIAGNOSIS — R2681 Unsteadiness on feet: Secondary | ICD-10-CM

## 2024-10-25 DIAGNOSIS — R2689 Other abnormalities of gait and mobility: Secondary | ICD-10-CM

## 2024-10-25 DIAGNOSIS — M25651 Stiffness of right hip, not elsewhere classified: Secondary | ICD-10-CM

## 2024-10-25 DIAGNOSIS — M25652 Stiffness of left hip, not elsewhere classified: Secondary | ICD-10-CM

## 2024-10-25 DIAGNOSIS — M6281 Muscle weakness (generalized): Secondary | ICD-10-CM

## 2024-10-25 MED ORDER — FREESTYLE LIBRE 3 SENSOR MISC: 1.0000 | 11 refills | Status: DC

## 2024-10-25 NOTE — Telephone Encounter (Signed)
 Patient contacts office, requesting assistance with CGM (continuous glucose monitor)  States insurance has changed preference from Dexcom to [place] Requests new prescription for [place] 3.0 (not plus) for his insurance to cover.   Reports glucose control has been doing well at home.   Agreed to send new prescription.  Patient educated on proper use and differences of Libre 3 vs. Dexcom G7  Following instruction patient verbalized understanding of treatment plan.  Sample sensor placed at front desk for patient use during PA approval process  - plans to pick up on 1/8  Total time with patient call and documentation of interaction: 12 minutes.

## 2024-10-25 NOTE — Therapy (Signed)
 "  OUTPATIENT PHYSICAL THERAPY PROSTHETICS EVALUATION   Patient Name: Sean Carter MRN: [id number] DOB:1970-12-23, 54 y.o., male Today's Date: 10/26/2024  PCP: Howell Lunger, DO  REFERRING PROVIDER: Maurice Sharlet RAMAN, PA-C  END OF SESSION:  PT End of Session - 10/25/24 0852     Visit Number 1    Number of Visits 5    Date for Recertification  11/29/24    PT Start Time 0852    PT Stop Time 0936    PT Time Calculation (min) 44 min    Equipment Utilized During Treatment Gait belt    Activity Tolerance Patient tolerated treatment well    Behavior During Therapy [facility] for tasks assessed/performed          Past Medical History:  Diagnosis Date   Blurry vision 07/10/2020   Diabetes mellitus without complication ([name])    Gangrene of toe of left foot ([name])    Peripheral arterial disease    Sepsis due to Streptococcus, group B ([name]) 06/20/2020   Streptococcal bacteremia    Past Surgical History:  Procedure Laterality Date   ABDOMINAL AORTOGRAM W/LOWER EXTREMITY N/A 01/18/2023   Procedure: ABDOMINAL AORTOGRAM W/LOWER EXTREMITY;  Surgeon: Eliza Lonni RAMAN, MD;  Location: [facility] INVASIVE CV LAB;  Service: Cardiovascular;  Laterality: N/A;   AMPUTATION Left 05/31/2020   Procedure: Ray Amputation of Left Great Toe with Negative Pressure Vac Placement;  Surgeon: Gretta Lonni PARAS, MD;  Location: [facility] OR;  Service: Vascular;  Laterality: Left;   AMPUTATION Right 07/22/2022   Procedure: RIGHT FIRST AND SECOND RAY PARTIAL  AMPUTATION;  Surgeon: Magda Debby SAILOR, MD;  Location: [facility] OR;  Service: Vascular;  Laterality: Right;   AMPUTATION Right 09/02/2023   Procedure: AMPUTATION BELOW KNEE;  Surgeon: Serene Gaile ORN, MD;  Location: [facility] OR;  Service: Vascular;  Laterality: Right;   AMPUTATION Left 08/22/2024   Procedure: LEFT AMPUTATION BELOW KNEE;  Surgeon: Sheree Penne Lonni, MD;  Location: [facility] OR;  Service: Vascular;  Laterality: Left;   APPLICATION OF WOUND VAC Right 01/22/2023    Procedure: APPLICATION OF WOUND VAC, RIGHT FOOT;  Surgeon: Sheree Penne Lonni, MD;  Location: [facility] OR;  Service: Vascular;  Laterality: Right;   INCISION AND DRAINAGE OF WOUND Right 01/22/2023   Procedure: WASHOUT OF RIGHT FOOT WOUND;  Surgeon: Sheree Penne Lonni, MD;  Location: [facility] OR;  Service: Vascular;  Laterality: Right;   PERIPHERAL VASCULAR BALLOON ANGIOPLASTY  01/18/2023   Procedure: PERIPHERAL VASCULAR BALLOON ANGIOPLASTY;  Surgeon: Eliza Lonni RAMAN, MD;  Location: [facility] INVASIVE CV LAB;  Service: Cardiovascular;;   STUMP REVISION Right 01/22/2023   Procedure: PARTIAL CLOSURE OF RIGHT FOOT WOUND;  Surgeon: Sheree Penne Lonni, MD;  Location: [facility] OR;  Service: Vascular;  Laterality: Right;   TRANSMETATARSAL AMPUTATION Right 01/19/2023   Procedure: TRANSMETATARSAL AMPUTATION;  Surgeon: Sheree Penne Lonni, MD;  Location: [facility] OR;  Service: Vascular;  Laterality: Right;   Patient Active Problem List   Diagnosis Date Noted   Prerenal azotemia 09/04/2024   Coping style affecting medical condition 08/29/2024   Left foot infection 08/24/2024   Below-knee amputation of left lower extremity ([name]) 08/24/2024   Sepsis due to Streptococcus agalactiae ([name]) 08/22/2024   Foot osteomyelitis, left s/p BKA ([name]) 08/21/2024   Chronic health problem 08/21/2024   Anemia 08/21/2024   Osteomyelitis of left foot ([name]) 08/21/2024   S/P BKA (below knee amputation) unilateral, right ([name]) 09/07/2023   Wound infection 09/03/2023   Type 2 diabetes mellitus ([name]) 09/02/2023  Elevated LFTs 09/02/2023   Normocytic anemia 09/02/2023   Subacute cough 09/02/2023   Sepsis with acute organ dysfunction without septic shock ([name]) 09/01/2023   Restless legs syndrome 06/25/2023   Hyperkalemia 02/18/2023   S/P transmetatarsal amputation of foot, right ([name]) 01/31/2023   Transaminitis 01/24/2023   Diabetic infection of right foot ([name]) 01/16/2023   Open wound of plantar aspect of foot 01/16/2023    Restrictive lung disease 01/15/2023   History of wheezing 08/28/2022   Symptoms of gastroesophageal reflux 08/28/2022   Leukocytosis 07/20/2022   Diabetic retinopathy ([name]) 06/29/2022   Mixed hyperlipidemia 11/20/2021   Blurry vision 07/10/2020   Superficial vein thrombosis 06/06/2020   Hypertension associated with diabetes ([name]) 06/06/2020   Diabetes mellitus type 2 with peripheral artery disease ([name]) 06/06/2020   Amputation of left great toe    Sepsis with streptococcus agalactiae ([name])    Hyponatremia 05/30/2020   Hypokalemia 05/30/2020    ONSET DATE: 08/22/2024 Lt BKA  REFERRING DIAG: D11.887I (ICD-10-CM) - Below-knee amputation of left lower extremity, subsequent encounter   THERAPY DIAG:  Other abnormalities of gait and mobility  Unsteadiness on feet  Stiffness of left hip, not elsewhere classified  Stiffness of right hip, not elsewhere classified  Muscle weakness (generalized)  Rationale for Evaluation and Treatment: Rehabilitation  SUBJECTIVE:   SUBJECTIVE STATEMENT: This 54yo male underwent left Transtibial Amputation on 08/22/2024. He has history of right Transtibial Amputation on 09/02/2023. Rt prosthesis has original socket that is too large and patient is currently working with prosthetists in order to improve fit before potentially receiving a new socket. Patient has not received Lt prosthesis at this current time.    Pt accompanied by: significant other [name]  PERTINENT HISTORY: DM2, PAD, Rt BKA, HTN, HLD, restrictive lung disease, GERD  PAIN:  Are you having pain? No Last two weeks: phantom sensation in the Lt residual limb, no phantom sensation on the Rt side, Lt hip pain when sitting in certain positions   PRECAUTIONS: Fall  RED FLAGS: Not assessed on eval   WEIGHT BEARING RESTRICTIONS: does not have weightbearing restrictions, though does not currently have Lt prosthesis   FALLS: Has patient fallen in last 6 months? Yes. Number of  falls 1, going to the walk  in the kitchen at work from outdoors and there was water  on the ramp, patient slipped and the limb went backwards which caused a cut on the toe that continuously got worse   LIVING ENVIRONMENT: Lives with: lives with an adult companion and a dog (pit and terrier mix) Lives in: House/apartment Home Access: Level entry ; has to walk about 12' to get to the front door  Home layout: Two level Stairs: Yes: Internal: 17 steps; on right going up Has following equipment at home: Walker - 2 wheeled, Crutches, and knee walker   PLOF: Independent and Rt prosthesis only with no device; working as investment banker, 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 10 represents able to perform at prior level. 0 1 2 3 4 5 6 7 8 9  10 (Date and Score)   Activity Eval     1. Standing activities  5     2. Reaching something off the floor while in the WC  3  3.     4.    5.    Score 4    Total score = sum of the activity scores/number of activities Minimum detectable change (90%CI) for average score = 2 points Minimum detectable change (90%CI) for single activity score = 3 points   COGNITION: Overall cognitive status: Within functional limits for tasks assessed   SENSATION: Patient subjectively noting no n/t or pain with palpation to Lt residual limb  MUSCLE LENGTH: Debby test: Right 9 deg; Left 9 deg  POSTURE: rounded shoulders, forward head, decreased lumbar lordosis, and increased thoracic kyphosis  LOWER EXTREMITY ROM:  ROM P:passive  A:active Right eval Left eval  Hip flexion    Hip extension    Hip abduction    Hip adduction    Hip internal rotation    Hip external rotation    Knee flexion    Knee extension     Ankle dorsiflexion    Ankle plantarflexion    Ankle inversion    Ankle eversion     (Blank rows = not tested)  LOWER EXTREMITY MMT:  MMT Right eval Left eval  Hip flexion    Hip extension    Hip abduction    Hip adduction    Hip internal rotation    Hip external rotation    Knee flexion    Knee extension 5/5 5/5  Ankle dorsiflexion    Ankle plantarflexion    Ankle inversion    Ankle eversion    At Evaluation all strength testing is grossly seated and functionally standing / gait. (Blank rows = not tested)  TRANSFERS: Squat pivot transfer: supervision with gait belt applied, though able to perform without requiring PT assist ; no safety concerns    GAIT: Gait pattern: unable to perform gait assessment on eval as patient has not received Lt prosthesis  Assistive device utilized: Wheelchair (manual) Level of assistance: currently nonambulatory with Rt prosthesis only  ; patient has a knee walker that he may be able to utilize for short distance mobility in the home ; unable to assess at eval due to no knee walker present ; patient to bring knee walker to next session   CURRENT PROSTHETIC WEAR ASSESSMENT: Patient is currently wearing Rt BKA prosthesis all wake hours with no phantom pain/sensation. Patient currently working with prosthetist to improve fit of Rt BKA prosthesis.   Patient is independent with: skin check, prosthetic cleaning, and proper wear schedule/adjustment Patient is dependent with: residual limb care and correct ply sock adjustment Donning prosthesis: Complete Independence and with Rt BKA  Doffing prosthesis: Complete Independence and with Rt BKA  Prosthetic wear tolerance: all wake hours 7 days/week with Rt BKA Edema: Rt LE no edema noted; Lt calf: 38.5cm Lt thigh 44cm Residual limb condition: Patient's Lt incision site is scabbed along most areas with a slight green color and has high levels of dry skin; mildly adhered; no odor or drainage noted  throughout ; slightly bulbous shape, no temperature changes noted     TODAY'S TREATMENT:  DATE: 10/25/2024 Prosthetic Care with bilat BKA (new Lt BKA) prosthesis: See patient education below. PT educated patient and significant other on performing head-on transfers, especially with public restrooms, in order to increase safety and independence in public. PT did a short demonstration within the eval. PT discussed bringing knee walker into next session in order to work on short distance mobility and educated on not performing long distances with knee walker and using it only with short distances (in and out of bathroom, etc.). PT performed measurement for vive wear liner liner in order to improve healing of residual limb incision site. Measurements were as follows: . PT advised patient on ordering vive wear IV/XL and to have two prosthetic shrinkers, not compression socks, on hand for cleanliness. PT educated on washing and drying them in order to help reset the size. PT discussed cleaning of shrinkers to include hand washing then hanging to dry, or washing in a linen bag on cold then hanging to dry.   PT discussed when to wear a liner under a shrinker and when to wear ply socks outside of silicone liner. PT educated on importance of having silicone liner touching skin in order to decrease slippage of liner with mobility. Patient demonstrated independence with donning and doffing of Rt prosthesis, sock, and silicone liner. No wounds noted on Rt residual limb.     PATIENT EDUCATION: PATIENT EDUCATED ON FOLLOWING PROSTHETIC CARE: Education details:  Skin check, Residual limb care, Prosthetic cleaning, Ply sock cleaning, Correct ply sock adjustment, Propper donning, and Proper doffing  Prosthetic wear tolerance: to be discussed and established after receiving Lt prosthesis  Person  educated: Patient and Spouse Education method: Medical Illustrator Education comprehension: verbalized understanding, returned demonstration, and needs further education  HOME EXERCISE PROGRAM: To be established at next session    ASSESSMENT:  CLINICAL IMPRESSION: Patient is a 54 y.o. male who was seen today for physical therapy evaluation and treatment for pre-prosthetic training with deficits in functional mobility, flexibility, balance, and motor coordination. Patient is limited with functional mobility secondary to having not received his Lt BKA prosthesis. Patient is currently motivated to become more independent with limited mobility status. Patient will benefit from skilled PT to address above noted deficits and increase functional mobility prior to receiving Lt BKA prosthesis.   OBJECTIVE IMPAIRMENTS: Abnormal gait, decreased activity tolerance, decreased balance, decreased coordination, decreased endurance, decreased knowledge of use of DME, decreased mobility, difficulty walking, decreased ROM, impaired flexibility, improper body mechanics, postural dysfunction, and prosthetic dependency .   ACTIVITY LIMITATIONS: lifting, bending, standing, squatting, stairs, reach over head, and locomotion level  PARTICIPATION LIMITATIONS: driving, community activity, and occupation  PERSONAL FACTORS: Past/current experiences and 1-2 comorbidities: DM2, PAD are also affecting patient's functional outcome.   REHAB POTENTIAL: Good  CLINICAL DECISION MAKING: Evolving/moderate complexity  EVALUATION COMPLEXITY: Moderate   GOALS: Goals reviewed with patient? Yes  LONG TERM GOALS: Target date: 11/29/2024 Patient will report ability to stand 2-5 minutes while kneeling Lt on a support surface while performing standing ADLs.  2. Patient will report ability to increase independent ambulation while using Lt knee walker and Rt BKA prosthesis for at least 10-20' in order to give access to  more areas.  3. Patient will show comprehension and be independent with performing HEP.     PLAN:  PT FREQUENCY: 1x/week  PT DURATION: other: 5 weeks   PLANNED INTERVENTIONS: 97164- PT Re-evaluation, 97750- Physical Performance Testing, 97110-Therapeutic exercises, 97530- Therapeutic activity, [id number]- Neuromuscular re-education, 97535- Self Care,  [id number]- Manual therapy, [id number]- Gait training, [id number]- Prosthetic Initial , [id number]- Orthotic/Prosthetic subsequent, [phone]- Wound care (first 20 sq cm), Patient/Family education, Balance training, Joint mobilization, Joint manipulation, Spinal manipulation, Spinal mobilization, Scar mobilization, Vestibular training, DME instructions, and Wheelchair mobility training  PLAN FOR NEXT SESSION:  transfers with knee scooter, ply sock wear, HEP, hip flexor contracture stretching, standing and gait with knee walker     Susannah Daring, PT, DPT 10/26/2024 8:02 AM    "

## 2024-10-26 ENCOUNTER — Telehealth: Payer: Self-pay | Admitting: Pharmacist

## 2024-10-26 ENCOUNTER — Encounter: Payer: Self-pay | Attending: Registered Nurse | Admitting: Registered Nurse

## 2024-10-26 ENCOUNTER — Encounter: Payer: Self-pay | Admitting: Registered Nurse

## 2024-10-26 VITALS — BP 153/79 | HR 75 | Ht 68.0 in

## 2024-10-26 DIAGNOSIS — S88112D Complete traumatic amputation at level between knee and ankle, left lower leg, subsequent encounter: Secondary | ICD-10-CM | POA: Diagnosis present

## 2024-10-26 DIAGNOSIS — I10 Essential (primary) hypertension: Secondary | ICD-10-CM | POA: Insufficient documentation

## 2024-10-26 DIAGNOSIS — Z89511 Acquired absence of right leg below knee: Secondary | ICD-10-CM | POA: Insufficient documentation

## 2024-10-26 NOTE — Patient Instructions (Signed)
 Send Dr Lorilee  a My- Chart message regarding the need to F/U with Dr Lorilee

## 2024-10-26 NOTE — Telephone Encounter (Signed)
 Patient contacts office, requesting PA for CGM recently prescribed.   Prior Authorization (PA) follow-up need communicated to [facility], CPhT  Patient was provided sensor for pick-up and should have plenty of time to have PA completed in normal flow.   Total time with patient call and documentation of interaction: 9 minutes.

## 2024-10-26 NOTE — Telephone Encounter (Signed)
 Reviewed and agree with Dr Rennis plan.

## 2024-10-26 NOTE — Progress Notes (Unsigned)
 "  Subjective:    [name], male    DOB: [birth year], 54 y.o.   MRN: [id number]  HPI: Sean Carter is a 54 y.o. male who is here for HFU appointment for F/U of his S/P Left BKA and History of Right BKA and Essential Hypertension.   Sean Carter presented to [facility] with Left foot infection on 08/21/2025.   Dr. Diona: H&P Sean Carter is a 54 y.o. male presenting with left foot infection. PMHx significant for PAD s/p right BKA (08/2023), s/p left great toe amputation 2/2 gangrene; T2DM, HTN, HLD, restrictive lung disease, GERD   Most concerning for osteomyelitis given depth and appearance of wound along with Xray, patient history and elevated white count to 19.7. Also with sepsis; patient with fever up to 102.2, HR mildly elevated to 108, white count; thankfully, with normal lactic acid at 1.6, not requiring O2 at this time, BP, RR, O2 stable. Low concern for DVT given doppler signal present on exam by VVS. Treating with antibiotics, vascular on board for likely amputation.  DG: Left Foot:  IMPRESSION: 1. Possible osteomyelitis of the medial cortex of the left navicular bone with overlying soft tissue gas.  He underwent: on 08/22/2024: By Dr Sheree:  LEFT AMPUTATION BELOW KNEE Left    MR: Thoracic Spine: IMPRESSION: 1. Incomplete study with early termination secondary to patient claustrophobia. Only sagittal T2 and STIR performed. 2. At T12-L1, endplate signal changes which appear degenerative but are incompletely imaged. To fully exclude discitis, recommend completing MRI of the lumbar spine (preferably with contrast) if the patient is able. 3. No obvious evidence of discitis/osteomyelitis at other thoracic levels. 4. No high-grade canal or foraminal stenosis in the thoracic spine.  Sean Carter was admitted to inpatient Rehabilitation on 08/24/2024 and discharged home on 09/04/2024.  He denies and pain at this time. He rates his pain 0.  Also reports he has a  good appetite.    Pain Inventory Average Pain 0 Pain Right Now 0 My pain is no pain  In the last 24 hours, has pain interfered with the following? General activity 0 Relation with others 0 Enjoyment of life 0 What TIME of day is your pain at its worst? No pain Sleep (in general) NA  Pain is worse with: no pain Pain improves with: no pain Relief from Meds: no pain  Family History  Problem Relation Age of Onset   Diabetes Father    Social History   Socioeconomic History   Marital status: Married    Spouse name: Not on file   Number of children: Not on file   Years of education: Not on file   Highest education level: 12th grade  Occupational History   Not on file  Tobacco Use   Smoking status: Never    Passive exposure: Never   Smokeless tobacco: Never  Vaping Use   Vaping status: Never Used  Substance and Sexual Activity   Alcohol  use: Not on file   Drug use: Never   Sexual activity: Not on file  Other Topics Concern   Not on file  Social History Narrative   Not on file   Social Drivers of Health   Tobacco Use: Low Risk (10/26/2024)   Patient History    Smoking Tobacco Use: Never    Smokeless Tobacco Use: Never    Passive Exposure: Never  Financial Resource Strain: High Risk (05/02/2024)   Overall Financial Resource Strain (CARDIA)    Difficulty  of Paying Living Expenses: Hard  Food Insecurity: No Food Insecurity (08/21/2024)   Epic    Worried About Programme Researcher, Broadcasting/film/video in the Last Year: Never true    Ran Out of Food in the Last Year: Never true  Transportation Needs: No Transportation Needs (08/21/2024)   Epic    Lack of Transportation (Medical): No    Lack of Transportation (Non-Medical): No  Physical Activity: Insufficiently Active (05/02/2024)   Exercise Vital Sign    Days of Exercise per Week: 2 days    Minutes of Exercise per Session: 30 min  Stress: No Stress Concern Present (05/02/2024)   [employer] of Occupational Health - Occupational  Stress Questionnaire    Feeling of Stress: Only a little  Social Connections: Moderately Integrated (05/02/2024)   Social Connection and Isolation Panel    Frequency of Communication with Friends and Family: More than three times a week    Frequency of Social Gatherings with Friends and Family: Once a week    Attends Religious Services: More than 4 times per year    Active Member of [employer] or Organizations: No    Attends Banker Meetings: Not on file    Marital Status: Living with partner  Depression (PHQ2-9): Low Risk (10/26/2024)   Depression (PHQ2-9)    PHQ-2 Score: 0  Alcohol  Screen: Not on file  Housing: High Risk (08/21/2024)   Epic    Unable to Pay for Housing in the Last Year: Yes    Number of Times Moved in the Last Year: 0    Homeless in the Last Year: No  Utilities: Not At Risk (08/21/2024)   Epic    Threatened with loss of utilities: No  Health Literacy: Not on file   Past Surgical History:  Procedure Laterality Date   ABDOMINAL AORTOGRAM W/LOWER EXTREMITY N/A 01/18/2023   Procedure: ABDOMINAL AORTOGRAM W/LOWER EXTREMITY;  Surgeon: Eliza Lonni RAMAN, MD;  Location: [facility] INVASIVE CV LAB;  Service: Cardiovascular;  Laterality: N/A;   AMPUTATION Left 05/31/2020   Procedure: Ray Amputation of Left Great Toe with Negative Pressure Vac Placement;  Surgeon: Gretta Lonni PARAS, MD;  Location: [facility] OR;  Service: Vascular;  Laterality: Left;   AMPUTATION Right 07/22/2022   Procedure: RIGHT FIRST AND SECOND RAY PARTIAL  AMPUTATION;  Surgeon: Magda Debby SAILOR, MD;  Location: [facility] OR;  Service: Vascular;  Laterality: Right;   AMPUTATION Right 09/02/2023   Procedure: AMPUTATION BELOW KNEE;  Surgeon: Serene Gaile ORN, MD;  Location: [facility] OR;  Service: Vascular;  Laterality: Right;   AMPUTATION Left 08/22/2024   Procedure: LEFT AMPUTATION BELOW KNEE;  Surgeon: Sheree Penne Lonni, MD;  Location: [facility] OR;  Service: Vascular;  Laterality: Left;   APPLICATION OF WOUND VAC Right  01/22/2023   Procedure: APPLICATION OF WOUND VAC, RIGHT FOOT;  Surgeon: Sheree Penne Lonni, MD;  Location: [facility] OR;  Service: Vascular;  Laterality: Right;   INCISION AND DRAINAGE OF WOUND Right 01/22/2023   Procedure: WASHOUT OF RIGHT FOOT WOUND;  Surgeon: Sheree Penne Lonni, MD;  Location[facility] OR;  Service: Vascular;  Laterality: Right;   PERIPHERAL VASCULAR BALLOON ANGIOPLASTY  01/18/2023   Procedure: PERIPHERAL VASCULAR BALLOON ANGIOPLASTY;  Surgeon: Eliza Lonni RAMAN, MD;  Location: [facility] INVASIVE CV LAB;  Service: Cardiovascular;;   STUMP REVISION Right 01/22/2023   Procedure: PARTIAL CLOSURE OF RIGHT FOOT WOUND;  Surgeon: Sheree Penne Lonni, MD;  Location: [facility] OR;  Service: Vascular;  Laterality: Right;   TRANSMETATARSAL AMPUTATION Right 01/19/2023  Procedure: TRANSMETATARSAL AMPUTATION;  Surgeon: Sheree Penne Bruckner, MD;  Location: [facility] OR;  Service: Vascular;  Laterality: Right;   Past Surgical History:  Procedure Laterality Date   ABDOMINAL AORTOGRAM W/LOWER EXTREMITY N/A 01/18/2023   Procedure: ABDOMINAL AORTOGRAM W/LOWER EXTREMITY;  Surgeon: Eliza Bruckner RAMAN, MD;  Location: [facility] INVASIVE CV LAB;  Service: Cardiovascular;  Laterality: N/A;   AMPUTATION Left 05/31/2020   Procedure: Ray Amputation of Left Great Toe with Negative Pressure Vac Placement;  Surgeon: Gretta Bruckner PARAS, MD;  Location: [facility] OR;  Service: Vascular;  Laterality: Left;   AMPUTATION Right 07/22/2022   Procedure: RIGHT FIRST AND SECOND RAY PARTIAL  AMPUTATION;  Surgeon: Magda Debby SAILOR, MD;  Location: [facility] OR;  Service: Vascular;  Laterality: Right;   AMPUTATION Right 09/02/2023   Procedure: AMPUTATION BELOW KNEE;  Surgeon: Serene Gaile ORN, MD;  Location: [facility] OR;  Service: Vascular;  Laterality: Right;   AMPUTATION Left 08/22/2024   Procedure: LEFT AMPUTATION BELOW KNEE;  Surgeon: Sheree Penne Bruckner, MD;  Location: [facility] OR;  Service: Vascular;  Laterality: Left;   APPLICATION OF WOUND VAC Right  01/22/2023   Procedure: APPLICATION OF WOUND VAC, RIGHT FOOT;  Surgeon: Sheree Penne Bruckner, MD;  Location: [facility] OR;  Service: Vascular;  Laterality: Right;   INCISION AND DRAINAGE OF WOUND Right 01/22/2023   Procedure: WASHOUT OF RIGHT FOOT WOUND;  Surgeon: Sheree Penne Bruckner, MD;  Location: [facility] OR;  Service: Vascular;  Laterality: Right;   PERIPHERAL VASCULAR BALLOON ANGIOPLASTY  01/18/2023   Procedure: PERIPHERAL VASCULAR BALLOON ANGIOPLASTY;  Surgeon: Eliza Bruckner RAMAN, MD;  Location: [facility] INVASIVE CV LAB;  Service: Cardiovascular;;   STUMP REVISION Right 01/22/2023   Procedure: PARTIAL CLOSURE OF RIGHT FOOT WOUND;  Surgeon: Sheree Penne Bruckner, MD;  Location: [facility] OR;  Service: Vascular;  Laterality: Right;   TRANSMETATARSAL AMPUTATION Right 01/19/2023   Procedure: TRANSMETATARSAL AMPUTATION;  Surgeon: Sheree Penne Bruckner, MD;  Location: [facility] OR;  Service: Vascular;  Laterality: Right;   Past Medical History:  Diagnosis Date   Blurry vision 07/10/2020   Diabetes mellitus without complication ([name])    Gangrene of toe of left foot ([name])    Peripheral arterial disease    Sepsis due to Streptococcus, group B ([name]) 06/20/2020   Streptococcal bacteremia    BP (!) 153/79   Pulse 75   Ht 5' 8 (1.727 m)   SpO2 96%   BMI 30.74 kg/m   Opioid Risk Score:   Fall Risk Score:  `1  Depression screen [facility] 2/9     10/26/2024    2:19 PM 05/03/2024    8:40 AM 08/30/2023    8:41 AM 08/26/2023    2:12 PM 06/25/2023    3:46 PM 02/17/2023   10:29 AM 02/04/2023    2:09 PM  Depression screen PHQ 2/9  Decreased Interest 0  0 0 0 0 0  Down, Depressed, Hopeless 0 0 0 0 0 0 0  PHQ - 2 Score 0 0 0 0 0 0 0  Altered sleeping 0 0 0 0 0 0 0  Tired, decreased energy 0 0 0 0 0 0 0  Change in appetite 0 0 0 0 0 0 0  Feeling bad or failure about yourself  0 0 0 0 0 0 0  Trouble concentrating 0 0 0 0 0 0 0  Moving slowly or fidgety/restless 0 0 0 0 0 0 0  Suicidal thoughts 0 0 0 0 0 0 0  PHQ-9  Score 0 0  0  0  0  0  0   Difficult doing work/chores  Not difficult at all          Data saved with a previous flowsheet row definition     Review of Systems  Musculoskeletal:  Positive for gait problem.  All other systems reviewed and are negative.      Objective:   Physical Exam Vitals and nursing note reviewed.  Constitutional:      Appearance: Normal appearance.  Cardiovascular:     Rate and Rhythm: Normal rate and regular rhythm.     Pulses: Normal pulses.     Heart sounds: Normal heart sounds.  Pulmonary:     Effort: Pulmonary effort is normal.     Breath sounds: Normal breath sounds.  Musculoskeletal:     Comments: Normal Muscle Bulk and Muscle Testing Reveals:  Upper Extremities: Full ROM and Muscle Strength 5/5 Lower Extremities: Right: BKA wearing Prosthesis Left BKA Arrived in wheelchair    Skin:    General: Skin is warm and dry.  Neurological:     Mental Status: He is alert and oriented to person, place, and time.  Psychiatric:        Mood and Affect: Mood normal.        Behavior: Behavior normal.          Assessment & Plan:  S/P Left BKA : VascularFollowing. and History of Right BKA: Vascular  following. Continue to Monitor.  Essential Hypertension: Continue current medication regimen. PCP following. Continue to Monitor.   F/U with Dr Lorilee in 4- 6 weeks    "

## 2024-10-27 ENCOUNTER — Telehealth: Payer: Self-pay | Admitting: Student

## 2024-10-27 ENCOUNTER — Other Ambulatory Visit (HOSPITAL_COMMUNITY): Payer: Self-pay

## 2024-10-27 NOTE — Telephone Encounter (Signed)
 Patient called saying that he needs a leeter from his doctor stating that he is unable to work any longer. Needs this for disability. Asks if it can please be sent to his MyChart

## 2024-10-27 NOTE — Telephone Encounter (Signed)
 Benefits Investigation ran, patient has 2 commercial coverages, [employer] and Advice [employer]er.  Sensors are covered on both insurances for $74.99. Pharmacy can use either insurance primarily, but wont be able to bill both.

## 2024-10-27 NOTE — Telephone Encounter (Signed)
 Patient calls nurse line in regards to letter request.   He reports he is not seeking disability from PCP.   He reports in order to get food stamps he needs a letter stating he is unable to work due to current medical conditions.  Advised will forward to PCP.

## 2024-10-27 NOTE — Telephone Encounter (Signed)
 Reviewed and agree with Dr Rennis plan.

## 2024-10-31 ENCOUNTER — Telehealth: Payer: Self-pay

## 2024-10-31 NOTE — Telephone Encounter (Signed)
 Pt called requesting a letter sent to Lucie Ferrier ([email or web address]) to let her know he is on short term disability until 02/19/25 s/p L AKA on 08/22/24. Pt's PCP is unable to see him until next month. Pt advised to take that appt with PCP. I spoke with Lucie who stated she would accept our letter that pt is out on disability until 02/19/25. Pt is aware that his PCP will be the one to continue this past May, if needed.

## 2024-11-01 ENCOUNTER — Ambulatory Visit (INDEPENDENT_AMBULATORY_CARE_PROVIDER_SITE_OTHER): Payer: Self-pay

## 2024-11-01 DIAGNOSIS — R2689 Other abnormalities of gait and mobility: Secondary | ICD-10-CM

## 2024-11-01 DIAGNOSIS — R2681 Unsteadiness on feet: Secondary | ICD-10-CM

## 2024-11-01 DIAGNOSIS — M6281 Muscle weakness (generalized): Secondary | ICD-10-CM

## 2024-11-01 DIAGNOSIS — M25652 Stiffness of left hip, not elsewhere classified: Secondary | ICD-10-CM

## 2024-11-01 DIAGNOSIS — M25651 Stiffness of right hip, not elsewhere classified: Secondary | ICD-10-CM

## 2024-11-01 NOTE — Therapy (Signed)
 "  OUTPATIENT PHYSICAL THERAPY PROSTHETICS EVALUATION   Patient Name: Sean Carter MRN: [id number] DOB:12/08/1970, 54 y.o., male Today's Date: 11/01/2024  PCP: Howell Lunger, DO  REFERRING PROVIDER: Maurice Sharlet RAMAN, PA-C  END OF SESSION:  PT End of Session - 11/01/24 0933     Visit Number 2    Number of Visits 5    Date for Recertification  11/29/24    PT Start Time 0933    PT Stop Time 1013    PT Time Calculation (min) 40 min    Equipment Utilized During Treatment Gait belt    Activity Tolerance Patient tolerated treatment well    Behavior During Therapy [facility] for tasks assessed/performed          Past Medical History:  Diagnosis Date   Blurry vision 07/10/2020   Diabetes mellitus without complication ([name])    Gangrene of toe of left foot ([name])    Peripheral arterial disease    Sepsis due to Streptococcus, group B ([name]) 06/20/2020   Streptococcal bacteremia    Past Surgical History:  Procedure Laterality Date   ABDOMINAL AORTOGRAM W/LOWER EXTREMITY N/A 01/18/2023   Procedure: ABDOMINAL AORTOGRAM W/LOWER EXTREMITY;  Surgeon: Eliza Lonni RAMAN, MD;  Location: [facility] INVASIVE CV LAB;  Service: Cardiovascular;  Laterality: N/A;   AMPUTATION Left 05/31/2020   Procedure: Ray Amputation of Left Great Toe with Negative Pressure Vac Placement;  Surgeon: Gretta Lonni PARAS, MD;  Location: [facility] OR;  Service: Vascular;  Laterality: Left;   AMPUTATION Right 07/22/2022   Procedure: RIGHT FIRST AND SECOND RAY PARTIAL  AMPUTATION;  Surgeon: Magda Debby SAILOR, MD;  Location: [facility] OR;  Service: Vascular;  Laterality: Right;   AMPUTATION Right 09/02/2023   Procedure: AMPUTATION BELOW KNEE;  Surgeon: Serene Gaile ORN, MD;  Location: [facility] OR;  Service: Vascular;  Laterality: Right;   AMPUTATION Left 08/22/2024   Procedure: LEFT AMPUTATION BELOW KNEE;  Surgeon: Sheree Penne Lonni, MD;  Location: [facility] OR;  Service: Vascular;  Laterality: Left;   APPLICATION OF WOUND VAC Right 01/22/2023    Procedure: APPLICATION OF WOUND VAC, RIGHT FOOT;  Surgeon: Sheree Penne Lonni, MD;  Location: [facility] OR;  Service: Vascular;  Laterality: Right;   INCISION AND DRAINAGE OF WOUND Right 01/22/2023   Procedure: WASHOUT OF RIGHT FOOT WOUND;  Surgeon: Sheree Penne Lonni, MD;  Location: [facility] OR;  Service: Vascular;  Laterality: Right;   PERIPHERAL VASCULAR BALLOON ANGIOPLASTY  01/18/2023   Procedure: PERIPHERAL VASCULAR BALLOON ANGIOPLASTY;  Surgeon: Eliza Lonni RAMAN, MD;  Location: [facility] INVASIVE CV LAB;  Service: Cardiovascular;;   STUMP REVISION Right 01/22/2023   Procedure: PARTIAL CLOSURE OF RIGHT FOOT WOUND;  Surgeon: Sheree Penne Lonni, MD;  Location: [facility] OR;  Service: Vascular;  Laterality: Right;   TRANSMETATARSAL AMPUTATION Right 01/19/2023   Procedure: TRANSMETATARSAL AMPUTATION;  Surgeon: Sheree Penne Lonni, MD;  Location: [facility] OR;  Service: Vascular;  Laterality: Right;   Patient Active Problem List   Diagnosis Date Noted   Prerenal azotemia 09/04/2024   Coping style affecting medical condition 08/29/2024   Left foot infection 08/24/2024   Below-knee amputation of left lower extremity ([name]) 08/24/2024   Sepsis due to Streptococcus agalactiae ([name]) 08/22/2024   Foot osteomyelitis, left s/p BKA ([name]) 08/21/2024   Chronic health problem 08/21/2024   Anemia 08/21/2024   Osteomyelitis of left foot ([name]) 08/21/2024   S/P BKA (below knee amputation) unilateral, right ([name]) 09/07/2023   Wound infection 09/03/2023   Type 2 diabetes mellitus ([name]) 09/02/2023  Elevated LFTs 09/02/2023   Normocytic anemia 09/02/2023   Subacute cough 09/02/2023   Sepsis with acute organ dysfunction without septic shock ([name]) 09/01/2023   Restless legs syndrome 06/25/2023   Hyperkalemia 02/18/2023   S/P transmetatarsal amputation of foot, right ([name]) 01/31/2023   Transaminitis 01/24/2023   Diabetic infection of right foot ([name]) 01/16/2023   Open wound of plantar aspect of foot 01/16/2023    Restrictive lung disease 01/15/2023   History of wheezing 08/28/2022   Symptoms of gastroesophageal reflux 08/28/2022   Leukocytosis 07/20/2022   Diabetic retinopathy ([name]) 06/29/2022   Mixed hyperlipidemia 11/20/2021   Blurry vision 07/10/2020   Superficial vein thrombosis 06/06/2020   Hypertension associated with diabetes ([name]) 06/06/2020   Diabetes mellitus type 2 with peripheral artery disease ([name]) 06/06/2020   Amputation of left great toe    Sepsis with streptococcus agalactiae ([name])    Hyponatremia 05/30/2020   Hypokalemia 05/30/2020    ONSET DATE: 08/22/2024 Lt BKA  REFERRING DIAG: D11.887I (ICD-10-CM) - Below-knee amputation of left lower extremity, subsequent encounter   THERAPY DIAG:  Other abnormalities of gait and mobility  Unsteadiness on feet  Stiffness of left hip, not elsewhere classified  Stiffness of right hip, not elsewhere classified  Muscle weakness (generalized)  Rationale for Evaluation and Treatment: Rehabilitation  SUBJECTIVE:   SUBJECTIVE STATEMENT: Patient reports that he still hasn't been able to see the prosthetist for the Rt prosthesis fit.    Pt accompanied by: significant other [name]  PERTINENT HISTORY: DM2, PAD, Rt BKA, HTN, HLD, restrictive lung disease, GERD  This 54yo male underwent left Transtibial Amputation on 08/22/2024. He has history of right Transtibial Amputation on 09/02/2023. Rt prosthesis has original socket that is too large and patient is currently working with prosthetists in order to improve fit before potentially receiving a new socket. Patient has not received Lt prosthesis at this current time.   PAIN:  Are you having pain? No Last two weeks: phantom sensation in the Lt residual limb, no phantom sensation on the Rt side, Lt hip pain when sitting in certain positions   PRECAUTIONS: Fall  RED FLAGS: Not assessed on eval   WEIGHT BEARING RESTRICTIONS: does not have weightbearing restrictions, though  does not currently have Lt prosthesis   FALLS: Has patient fallen in last 6 months? Yes. Number of falls 1, going to the walk  in the kitchen at work from outdoors and there was water  on the ramp, patient slipped and the limb went backwards which caused a cut on the toe that continuously got worse   LIVING ENVIRONMENT: Lives with: lives with an adult companion and a dog (pit and terrier mix) Lives in: House/apartment Home Access: Level entry ; has to walk about 12' to get to the front door  Home layout: Two level Stairs: Yes: Internal: 17 steps; on right going up Has following equipment at home: Walker - 2 wheeled, Crutches, and knee walker   PLOF: Independent and Rt prosthesis only with no device; working as investment banker, 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 10 represents able to perform at prior level. 0 1 2 3 4 5 6 7 8 9  10 (Date and Score)   Activity Eval     1. Standing activities  5     2. Reaching something off the floor while in the WC  3     3.     4.    5.    Score 4    Total score = sum of the activity scores/number of activities Minimum detectable change (90%CI) for average score = 2 points Minimum detectable change (90%CI) for single activity score = 3 points   COGNITION: Overall cognitive status: Within functional limits for tasks assessed   SENSATION: Patient subjectively noting no n/t or pain with palpation to Lt residual limb  MUSCLE LENGTH: Debby test: Right 9 deg; Left 9 deg  POSTURE: rounded shoulders, forward head, decreased lumbar lordosis, and increased thoracic kyphosis  LOWER EXTREMITY ROM:  ROM P:passive  A:active Right eval Left eval  Hip flexion    Hip extension    Hip abduction     Hip adduction    Hip internal rotation    Hip external rotation    Knee flexion    Knee extension    Ankle dorsiflexion    Ankle plantarflexion    Ankle inversion    Ankle eversion     (Blank rows = not tested)  LOWER EXTREMITY MMT:  MMT Right eval Left eval  Hip flexion    Hip extension    Hip abduction    Hip adduction    Hip internal rotation    Hip external rotation    Knee flexion    Knee extension 5/5 5/5  Ankle dorsiflexion    Ankle plantarflexion    Ankle inversion    Ankle eversion    At Evaluation all strength testing is grossly seated and functionally standing / gait. (Blank rows = not tested)  TRANSFERS: Squat pivot transfer: supervision with gait belt applied, though able to perform without requiring PT assist ; no safety concerns    GAIT: Gait pattern: unable to perform gait assessment on eval as patient has not received Lt prosthesis  Assistive device utilized: Wheelchair (manual) Level of assistance: currently nonambulatory with Rt prosthesis only  ; patient has a knee walker that he may be able to utilize for short distance mobility in the home ; unable to assess at eval due to no knee walker present ; patient to bring knee walker to next session   CURRENT PROSTHETIC WEAR ASSESSMENT: Patient is currently wearing Rt BKA prosthesis all wake hours with no phantom pain/sensation. Patient currently working with prosthetist to improve fit of Rt BKA prosthesis.   Patient is independent with: skin check, prosthetic cleaning, and proper wear schedule/adjustment Patient is dependent with: residual limb care and correct ply sock adjustment Donning prosthesis: Complete Independence and with Rt BKA  Doffing prosthesis: Complete Independence and with Rt BKA  Prosthetic wear tolerance: all wake hours 7 days/week with Rt BKA Edema: Rt LE no edema noted; Lt calf: 38.5cm Lt thigh 44cm Residual limb condition: Patient's Lt incision site is scabbed along most areas with  a slight green color and has high levels of dry skin; mildly adhered; no odor or drainage noted throughout ; slightly bulbous shape, no temperature changes noted     TODAY'S TREATMENT:  DATE: 11/01/2024 Prosthetic Care with bilat BKA (new Lt BKA) prosthesis: PT discussed ply sock wear with patient and discussed bringing ply socks when he finds them at home (recently moved).  Patient performed 5 sit<>stands from wheelchair to knee walker with CGA, though able to perform with no required physical assist. PT discussed importance of tightening brakes as he is able to move knee walker with brakes applied. Patient acknowledged and agreed.  Patient performed static standing at knee walker with supervision.  Patient performed short distance ambulation (~5') using knee walker and CGA. Patient also performed sit<>stand from arm chair as well as sit<>stand from manual wheelchair placing sitting areas on opposite sides in order to practice transfers in different directions.  Patient ambulated with knee walker ~15-20' with CGA into bathroom within clinic in order to practice transfer from knee walker <> toilet. Patient requiring verbal cues and an increase of assist to min A in order to transfer to and from toilet secondary to balance. Patient then performed transfer to knee walker and required increased physical assist to min-mod A secondary to lateral LOB when attempting to turn knee walker. PT then discussed different appropriate placement of wheelchair in order to increase safety and efficiency with transfers and to decrease amount of turning required with knee walker.  Patient performed ADL of washing hands while standing at sink and placing Lt LE in wheelchair for stability. CGA and verbal cues provided, though CGA not required.   TherEx:  Hip flexor stretch with leg off table 3x30s each  side  discussed having significant other apply pressure to Lt side in order to increase intensity as he does not have prosthesis weight on Lt side    TODAY'S TREATMENT:                                                                                                                             DATE: 10/25/2024 Prosthetic Care with bilat BKA (new Lt BKA) prosthesis: See patient education below. PT educated patient and significant other on performing head-on transfers, especially with public restrooms, in order to increase safety and independence in public. PT did a short demonstration within the eval. PT discussed bringing knee walker into next session in order to work on short distance mobility and educated on not performing long distances with knee walker and using it only with short distances (in and out of bathroom, etc.). PT performed measurement for vive wear liner liner in order to improve healing of residual limb incision site. Measurements were as follows: . PT advised patient on ordering vive wear IV/XL and to have two prosthetic shrinkers, not compression socks, on hand for cleanliness. PT educated on washing and drying them in order to help reset the size. PT discussed cleaning of shrinkers to include hand washing then hanging to dry, or washing in a linen bag on cold then hanging to dry.   PT discussed when to wear a liner under a shrinker and when  to wear ply socks outside of silicone liner. PT educated on importance of having silicone liner touching skin in order to decrease slippage of liner with mobility. Patient demonstrated independence with donning and doffing of Rt prosthesis, sock, and silicone liner. No wounds noted on Rt residual limb.     PATIENT EDUCATION: PATIENT EDUCATED ON FOLLOWING PROSTHETIC CARE: Education details:  Skin check, Residual limb care, Prosthetic cleaning, Ply sock cleaning, Correct ply sock adjustment, Propper donning, and Proper doffing  Prosthetic wear  tolerance: to be discussed and established after receiving Lt prosthesis  Person educated: Patient and Spouse Education method: Medical Illustrator Education comprehension: verbalized understanding, returned demonstration, and needs further education  HOME EXERCISE PROGRAM: To be established at next session    ASSESSMENT:  CLINICAL IMPRESSION: Patient arrived to session noting fluctuations in Rt residual limb edema that can go from not needing a ply sock to potentially needing up to 15. Patient did endorse that he has stopped wearing the ply sock underneath silicone liner and fit of prosthesis has improved. Patient did well throughout session with the knee walker, though did need some verbal cueing for appropriate form and use of brakes. Patient will be bringing knee walker to next session. Patient will continue to benefit from skilled PT.  OBJECTIVE IMPAIRMENTS: Abnormal gait, decreased activity tolerance, decreased balance, decreased coordination, decreased endurance, decreased knowledge of use of DME, decreased mobility, difficulty walking, decreased ROM, impaired flexibility, improper body mechanics, postural dysfunction, and prosthetic dependency .   ACTIVITY LIMITATIONS: lifting, bending, standing, squatting, stairs, reach over head, and locomotion level  PARTICIPATION LIMITATIONS: driving, community activity, and occupation  PERSONAL FACTORS: Past/current experiences and 1-2 comorbidities: DM2, PAD are also affecting patient's functional outcome.   REHAB POTENTIAL: Good  CLINICAL DECISION MAKING: Evolving/moderate complexity  EVALUATION COMPLEXITY: Moderate   GOALS: Goals reviewed with patient? Yes  LONG TERM GOALS: Target date: 11/29/2024 Patient will report ability to stand 2-5 minutes while kneeling Lt on a support surface while performing standing ADLs.  2. Patient will report ability to increase independent ambulation while using Lt knee walker and Rt BKA  prosthesis for at least 10-20' in order to give access to more areas.  3. Patient will show comprehension and be independent with performing HEP.     PLAN:  PT FREQUENCY: 1x/week  PT DURATION: other: 5 weeks   PLANNED INTERVENTIONS: 97164- PT Re-evaluation, 97750- Physical Performance Testing, 97110-Therapeutic exercises, 97530- Therapeutic activity, [id number]- Neuromuscular re-education, 97535- Self Care, [id number]- Manual therapy, [phone]- Gait training, [phone]- Prosthetic Initial , [phone]- Orthotic/Prosthetic subsequent, [phone]- Wound care (first 20 sq cm), Patient/Family education, Balance training, Joint mobilization, Joint manipulation, Spinal manipulation, Spinal mobilization, Scar mobilization, Vestibular training, DME instructions, and Wheelchair mobility training  PLAN FOR NEXT SESSION:  transfers with knee scooter, ply sock wear, HEP, hip flexor contracture stretching, standing and gait with knee walker     Susannah Daring, PT, DPT 11/01/2024 2:13 PM    "

## 2024-11-03 ENCOUNTER — Other Ambulatory Visit (HOSPITAL_COMMUNITY): Payer: Self-pay

## 2024-11-03 ENCOUNTER — Telehealth: Payer: Self-pay | Admitting: Pharmacist

## 2024-11-03 ENCOUNTER — Other Ambulatory Visit: Payer: Self-pay | Admitting: Physical Medicine and Rehabilitation

## 2024-11-03 ENCOUNTER — Other Ambulatory Visit: Payer: Self-pay | Admitting: Student

## 2024-11-03 DIAGNOSIS — E1159 Type 2 diabetes mellitus with other circulatory complications: Secondary | ICD-10-CM

## 2024-11-03 NOTE — Telephone Encounter (Signed)
 Reviewed and agree with Dr Rennis plan.

## 2024-11-03 NOTE — Telephone Encounter (Signed)
 Attempted PA sent from pharmacy although both insurances will process as paid claims through our pharmacies.  Pharmacy Patient Advocate Encounter  Insurance verification completed.   The patient is insured through [employer].   PA required; PA submitted to above mentioned insurance via Latent Key/confirmation #/[employer]. Status is pending

## 2024-11-03 NOTE — Telephone Encounter (Signed)
 Patient contacts office, requesting assistance with CGM (continuous glucose monitor) approval.  Stating, the pharmacy has not yet received what they need so he can get his sensors for free.  He believes it was lack of a PA.  He requesting assistance with PA approval.   Phamracy - [facility] [address] [facility] [place] [facility]  I shared that I would have someone look into resolving this.  He thanked me for the help  Total time with patient call and documentation of interaction: 8 minutes.

## 2024-11-06 ENCOUNTER — Other Ambulatory Visit (HOSPITAL_COMMUNITY): Payer: Self-pay

## 2024-11-06 NOTE — Telephone Encounter (Signed)
 Pharmacy Patient Advocate Encounter  Received notification from [employer] that Prior Authorization for FREESTYLE LIBRE 3 SENSOR has been APPROVED from 11/03/24 to 11/03/25   PA #/Case ID/Reference #: [id number]  Copay $55.87 for 2 sensors for 28 day supply, using [facility].

## 2024-11-08 ENCOUNTER — Telehealth: Payer: Self-pay | Admitting: Pharmacist

## 2024-11-08 ENCOUNTER — Ambulatory Visit: Payer: Self-pay

## 2024-11-08 DIAGNOSIS — M6281 Muscle weakness (generalized): Secondary | ICD-10-CM | POA: Diagnosis not present

## 2024-11-08 DIAGNOSIS — R2689 Other abnormalities of gait and mobility: Secondary | ICD-10-CM | POA: Diagnosis not present

## 2024-11-08 DIAGNOSIS — M25651 Stiffness of right hip, not elsewhere classified: Secondary | ICD-10-CM

## 2024-11-08 DIAGNOSIS — M25652 Stiffness of left hip, not elsewhere classified: Secondary | ICD-10-CM | POA: Diagnosis not present

## 2024-11-08 DIAGNOSIS — R2681 Unsteadiness on feet: Secondary | ICD-10-CM | POA: Diagnosis not present

## 2024-11-08 MED ORDER — DEXCOM G7 15 DAY SENSOR MISC: 1.0000 | 11 refills | Status: DC

## 2024-11-08 NOTE — Telephone Encounter (Signed)
 CGM use - contacted pharmacy and determined Dexcom G7 is NOW covered at $0 per pharmacy.  Pharmacist mentions that wife has already picked-up supply.   Attempted to contact patient for follow-up of CGM access  GMI appears to be good with Dexcom 7.6 Patient happy with use of Dexcom.  Discussed availability of Dexcom 15 day.  He agreed with plan to switch to Dexcom G7 15 day at next fill.   Changed CGM in computer - new prescription provided.  Total time with patient call and documentation of interaction: 12 minutes.

## 2024-11-08 NOTE — Therapy (Signed)
 "  OUTPATIENT PHYSICAL THERAPY PROSTHETICS TREATMENT / DISCHARGE    Patient Name: Sean Carter MRN: [id number] DOB:[birth year], 54 y.o., male Today's Date: 11/08/2024  PCP: Howell Lunger, DO  REFERRING PROVIDER: Maurice Sharlet RAMAN, PA-C  END OF SESSION:  PT End of Session - 11/08/24 1018     Visit Number 3    Number of Visits 5    Date for Recertification  11/29/24    PT Start Time 1018    PT Stop Time 1056    PT Time Calculation (min) 38 min    Equipment Utilized During Treatment Gait belt    Activity Tolerance Patient tolerated treatment well    Behavior During Therapy WFL for tasks assessed/performed           Past Medical History:  Diagnosis Date   Blurry vision 07/10/2020   Diabetes mellitus without complication ([name])    Gangrene of toe of left foot ([name])    Peripheral arterial disease    Sepsis due to Streptococcus, group B ([name]) 06/20/2020   Streptococcal bacteremia    Past Surgical History:  Procedure Laterality Date   ABDOMINAL AORTOGRAM W/LOWER EXTREMITY N/A 01/18/2023   Procedure: ABDOMINAL AORTOGRAM W/LOWER EXTREMITY;  Surgeon: Eliza Lonni RAMAN, MD;  Location: [facility] INVASIVE CV LAB;  Service: Cardiovascular;  Laterality: N/A;   AMPUTATION Left 05/31/2020   Procedure: Ray Amputation of Left Great Toe with Negative Pressure Vac Placement;  Surgeon: Gretta Lonni PARAS, MD;  Location: [facility] OR;  Service: Vascular;  Laterality: Left;   AMPUTATION Right 07/22/2022   Procedure: RIGHT FIRST AND SECOND RAY PARTIAL  AMPUTATION;  Surgeon: Magda Debby SAILOR, MD;  Location: [facility] OR;  Service: Vascular;  Laterality: Right;   AMPUTATION Right 09/02/2023   Procedure: AMPUTATION BELOW KNEE;  Surgeon: Serene Gaile ORN, MD;  Location: [facility] OR;  Service: Vascular;  Laterality: Right;   AMPUTATION Left 08/22/2024   Procedure: LEFT AMPUTATION BELOW KNEE;  Surgeon: Sheree Penne Lonni, MD;  Location: [facility] OR;  Service: Vascular;  Laterality: Left;   APPLICATION OF WOUND VAC Right  01/22/2023   Procedure: APPLICATION OF WOUND VAC, RIGHT FOOT;  Surgeon: Sheree Penne Lonni, MD;  Location: [facility] OR;  Service: Vascular;  Laterality: Right;   INCISION AND DRAINAGE OF WOUND Right 01/22/2023   Procedure: WASHOUT OF RIGHT FOOT WOUND;  Surgeon: Sheree Penne Lonni, MD;  Location: [facility] OR;  Service: Vascular;  Laterality: Right;   PERIPHERAL VASCULAR BALLOON ANGIOPLASTY  01/18/2023   Procedure: PERIPHERAL VASCULAR BALLOON ANGIOPLASTY;  Surgeon: Eliza Lonni RAMAN, MD;  Location: [facility] INVASIVE CV LAB;  Service: Cardiovascular;;   STUMP REVISION Right 01/22/2023   Procedure: PARTIAL CLOSURE OF RIGHT FOOT WOUND;  Surgeon: Sheree Penne Lonni, MD;  Location: [facility] OR;  Service: Vascular;  Laterality: Right;   TRANSMETATARSAL AMPUTATION Right 01/19/2023   Procedure: TRANSMETATARSAL AMPUTATION;  Surgeon: Sheree Penne Lonni, MD;  Location: [facility] OR;  Service: Vascular;  Laterality: Right;   Patient Active Problem List   Diagnosis Date Noted   Prerenal azotemia 09/04/2024   Coping style affecting medical condition 08/29/2024   Left foot infection 08/24/2024   Below-knee amputation of left lower extremity ([name]) 08/24/2024   Sepsis due to Streptococcus agalactiae ([name]) 08/22/2024   Foot osteomyelitis, left s/p BKA ([name]) 08/21/2024   Chronic health problem 08/21/2024   Anemia 08/21/2024   Osteomyelitis of left foot ([name]) 08/21/2024   S/P BKA (below knee amputation) unilateral, right ([name]) 09/07/2023   Wound infection 09/03/2023   Type 2 diabetes mellitus (  [name]) 09/02/2023   Elevated LFTs 09/02/2023   Normocytic anemia 09/02/2023   Subacute cough 09/02/2023   Sepsis with acute organ dysfunction without septic shock ([name]) 09/01/2023   Restless legs syndrome 06/25/2023   Hyperkalemia 02/18/2023   S/P transmetatarsal amputation of foot, right ([name]) 01/31/2023   Transaminitis 01/24/2023   Diabetic infection of right foot ([name]) 01/16/2023   Open wound of plantar aspect of foot  01/16/2023   Restrictive lung disease 01/15/2023   History of wheezing 08/28/2022   Symptoms of gastroesophageal reflux 08/28/2022   Leukocytosis 07/20/2022   Diabetic retinopathy ([name]) 06/29/2022   Mixed hyperlipidemia 11/20/2021   Blurry vision 07/10/2020   Superficial vein thrombosis 06/06/2020   Hypertension associated with diabetes ([name]) 06/06/2020   Diabetes mellitus type 2 with peripheral artery disease ([name]) 06/06/2020   Amputation of left great toe    Sepsis with streptococcus agalactiae ([name])    Hyponatremia 05/30/2020   Hypokalemia 05/30/2020    ONSET DATE: 08/22/2024 Lt BKA  REFERRING DIAG: D11.887I (ICD-10-CM) - Below-knee amputation of left lower extremity, subsequent encounter   THERAPY DIAG:  Other abnormalities of gait and mobility  Unsteadiness on feet  Stiffness of left hip, not elsewhere classified  Stiffness of right hip, not elsewhere classified  Muscle weakness (generalized)  Rationale for Evaluation and Treatment: Rehabilitation  SUBJECTIVE:   SUBJECTIVE STATEMENT: Patient reports that he has talked to hangar but is not able to make an appointment just yet. Patient reports that he feels that his Rt prosthesis is going to break soon.    Pt accompanied by: significant other [name]  PERTINENT HISTORY: DM2, PAD, Rt BKA, HTN, HLD, restrictive lung disease, GERD  This 54yo male underwent left Transtibial Amputation on 08/22/2024. He has history of right Transtibial Amputation on 09/02/2023. Rt prosthesis has original socket that is too large and patient is currently working with prosthetists in order to improve fit before potentially receiving a new socket. Patient has not received Lt prosthesis at this current time.   PAIN:  Are you having pain? No Last two weeks: phantom sensation in the Lt residual limb, no phantom sensation on the Rt side, Lt hip pain when sitting in certain positions   PRECAUTIONS: Fall  RED FLAGS: Not assessed on  eval   WEIGHT BEARING RESTRICTIONS: does not have weightbearing restrictions, though does not currently have Lt prosthesis   FALLS: Has patient fallen in last 6 months? Yes. Number of falls 1, going to the walk  in the kitchen at work from outdoors and there was water  on the ramp, patient slipped and the limb went backwards which caused a cut on the toe that continuously got worse   LIVING ENVIRONMENT: Lives with: lives with an adult companion and a dog (pit and terrier mix) Lives in: House/apartment Home Access: Level entry ; has to walk about 12' to get to the front door  Home layout: Two level Stairs: Yes: Internal: 17 steps; on right going up Has following equipment at home: Walker - 2 wheeled, Crutches, and knee walker   PLOF: Independent and Rt prosthesis only with no device; working as investment banker, 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 10 represents able to perform at prior level. 0 1 2 3 4  5  6 7 8 9 10  (Date and Score)   Activity Eval     1. Standing activities  5     2. Reaching something off the floor while in the WC  3     3.     4.    5.    Score 4    Total score = sum of the activity scores/number of activities Minimum detectable change (90%CI) for average score = 2 points Minimum detectable change (90%CI) for single activity score = 3 points   COGNITION: Overall cognitive status: Within functional limits for tasks assessed   SENSATION: Patient subjectively noting no n/t or pain with palpation to Lt residual limb  MUSCLE LENGTH: Debby test: Right 9 deg; Left 9 deg  POSTURE: rounded shoulders, forward head, decreased lumbar lordosis, and increased thoracic kyphosis  LOWER EXTREMITY ROM:  ROM P:passive   A:active Right eval Left eval  Hip flexion    Hip extension    Hip abduction    Hip adduction    Hip internal rotation    Hip external rotation    Knee flexion    Knee extension    Ankle dorsiflexion    Ankle plantarflexion    Ankle inversion    Ankle eversion     (Blank rows = not tested)  LOWER EXTREMITY MMT:  MMT Right eval Left eval  Hip flexion    Hip extension    Hip abduction    Hip adduction    Hip internal rotation    Hip external rotation    Knee flexion    Knee extension 5/5 5/5  Ankle dorsiflexion    Ankle plantarflexion    Ankle inversion    Ankle eversion    At Evaluation all strength testing is grossly seated and functionally standing / gait. (Blank rows = not tested)  TRANSFERS: Squat pivot transfer: supervision with gait belt applied, though able to perform without requiring PT assist ; no safety concerns    GAIT: Gait pattern: unable to perform gait assessment on eval as patient has not received Lt prosthesis  Assistive device utilized: Wheelchair (manual) Level of assistance: currently nonambulatory with Rt prosthesis only  ; patient has a knee walker that he may be able to utilize for short distance mobility in the home ; unable to assess at eval due to no knee walker present ; patient to bring knee walker to next session   CURRENT PROSTHETIC WEAR ASSESSMENT: Patient is currently wearing Rt BKA prosthesis all wake hours with no phantom pain/sensation. Patient currently working with prosthetist to improve fit of Rt BKA prosthesis.   Patient is independent with: skin check, prosthetic cleaning, and proper wear schedule/adjustment Patient is dependent with: residual limb care and correct ply sock adjustment Donning prosthesis: Complete Independence and with Rt BKA  Doffing prosthesis: Complete Independence and with Rt BKA  Prosthetic wear tolerance: all wake hours 7 days/week with Rt BKA Edema: Rt LE no edema noted; Lt calf: 38.5cm Lt thigh  44cm Residual limb condition: Patient's Lt incision site is scabbed along most areas with a slight green color and has high levels of dry skin; mildly adhered; no odor or drainage noted throughout ; slightly bulbous shape, no temperature changes noted     TODAY'S TREATMENT:  DATE: 11/08/2024 Prosthetic Care with bilat BKA (new Lt BKA) prosthesis: PT discussed worker's comp, injuries, and unsafe set up, how the process of pursuing worker's comp may delay the process of receiving Lt prosthesis, and letting medical insurance cover it while pursuing the workers comp in order to not delay the Lt prosthesis.  PT discussed socket revision, insurance coverage, and warranty with prostheses. PT discussed appropriate care and cleaning of liner to decrease chances of breakdown and infection. PT discussed making an appointment with Dr. Rexford in order for him to write a prescription for socket revision for it to be covered by insurance. PT discussed importance of fixing brakes on knee walker to increase safety.  PT discussed difference between having a vein/vascular MD vs having an orthopedic MD who specializes in amputations.    TODAY'S TREATMENT:                                                                                                                             DATE: 11/01/2024 Prosthetic Care with bilat BKA (new Lt BKA) prosthesis: PT discussed ply sock wear with patient and discussed bringing ply socks when he finds them at home (recently moved).  Patient performed 5 sit<>stands from wheelchair to knee walker with CGA, though able to perform with no required physical assist. PT discussed importance of tightening brakes as he is able to move knee walker with brakes applied. Patient acknowledged and agreed.  Patient performed static standing at knee walker with supervision.   Patient performed short distance ambulation (~5') using knee walker and CGA. Patient also performed sit<>stand from arm chair as well as sit<>stand from manual wheelchair placing sitting areas on opposite sides in order to practice transfers in different directions.  Patient ambulated with knee walker ~15-20' with CGA into bathroom within clinic in order to practice transfer from knee walker <> toilet. Patient requiring verbal cues and an increase of assist to min A in order to transfer to and from toilet secondary to balance. Patient then performed transfer to knee walker and required increased physical assist to min-mod A secondary to lateral LOB when attempting to turn knee walker. PT then discussed different appropriate placement of wheelchair in order to increase safety and efficiency with transfers and to decrease amount of turning required with knee walker.  Patient performed ADL of washing hands while standing at sink and placing Lt LE in wheelchair for stability. CGA and verbal cues provided, though CGA not required.   TherEx:  Hip flexor stretch with leg off table 3x30s each side  discussed having significant other apply pressure to Lt side in order to increase intensity as he does not have prosthesis weight on Lt side    TODAY'S TREATMENT:  DATE: 10/25/2024 Prosthetic Care with bilat BKA (new Lt BKA) prosthesis: See patient education below. PT educated patient and significant other on performing head-on transfers, especially with public restrooms, in order to increase safety and independence in public. PT did a short demonstration within the eval. PT discussed bringing knee walker into next session in order to work on short distance mobility and educated on not performing long distances with knee walker and using it only with short distances (in and out of bathroom,  etc.). PT performed measurement for vive wear liner liner in order to improve healing of residual limb incision site. Measurements were as follows: . PT advised patient on ordering vive wear IV/XL and to have two prosthetic shrinkers, not compression socks, on hand for cleanliness. PT educated on washing and drying them in order to help reset the size. PT discussed cleaning of shrinkers to include hand washing then hanging to dry, or washing in a linen bag on cold then hanging to dry.   PT discussed when to wear a liner under a shrinker and when to wear ply socks outside of silicone liner. PT educated on importance of having silicone liner touching skin in order to decrease slippage of liner with mobility. Patient demonstrated independence with donning and doffing of Rt prosthesis, sock, and silicone liner. No wounds noted on Rt residual limb.     PATIENT EDUCATION: PATIENT EDUCATED ON FOLLOWING PROSTHETIC CARE: Education details:  Skin check, Residual limb care, Prosthetic cleaning, Ply sock cleaning, Correct ply sock adjustment, Propper donning, and Proper doffing  Prosthetic wear tolerance: to be discussed and established after receiving Lt prosthesis  Person educated: Patient and Spouse Education method: Medical Illustrator Education comprehension: verbalized understanding, returned demonstration, and needs further education  HOME EXERCISE PROGRAM: To be established at next session    ASSESSMENT:  CLINICAL IMPRESSION: Patient arrived to session noting no new changes and continued difficulty with Rt prosthesis fit. PT provided education and discussions on multiple topics this date which patient was receptive to. Patient is an appropriate candidate for discharge this date and was encouraged to return as soon as he receives the Lt prosthesis.   OBJECTIVE IMPAIRMENTS: Abnormal gait, decreased activity tolerance, decreased balance, decreased coordination, decreased endurance,  decreased knowledge of use of DME, decreased mobility, difficulty walking, decreased ROM, impaired flexibility, improper body mechanics, postural dysfunction, and prosthetic dependency .   ACTIVITY LIMITATIONS: lifting, bending, standing, squatting, stairs, reach over head, and locomotion level  PARTICIPATION LIMITATIONS: driving, community activity, and occupation  PERSONAL FACTORS: Past/current experiences and 1-2 comorbidities: DM2, PAD are also affecting patient's functional outcome.   REHAB POTENTIAL: Good  CLINICAL DECISION MAKING: Evolving/moderate complexity  EVALUATION COMPLEXITY: Moderate   GOALS: Goals reviewed with patient? Yes  LONG TERM GOALS: Target date: 11/29/2024 Patient will report ability to stand 2-5 minutes while kneeling Lt on a support surface while performing standing ADLs. Goal met, 11/08/2024  2. Patient will report ability to increase independent ambulation while using Lt knee walker and Rt BKA prosthesis for at least 10-20' in order to give access to more areas.  B. Goal met, 11/08/2024  3. Patient will show comprehension and be independent with performing HEP.   C. Goal met, 11/08/2024    PLAN:  PT FREQUENCY: 1x/week  PT DURATION: other: 5 weeks   PLANNED INTERVENTIONS: [id number]- PT Re-evaluation, 97750- Physical Performance Testing, 97110-Therapeutic exercises, 97530- Therapeutic activity, [id number]- Neuromuscular re-education, 97535- Self Care, [id number]- Manual therapy, [id number]- Gait training, [id number]- Prosthetic Initial , [id number]- Orthotic/Prosthetic  subsequent, 97597- Wound care (first 20 sq cm), Patient/Family education, Balance training, Joint mobilization, Joint manipulation, Spinal manipulation, Spinal mobilization, Scar mobilization, Vestibular training, DME instructions, and Wheelchair mobility training  PLAN FOR NEXT SESSION:    PHYSICAL THERAPY DISCHARGE SUMMARY  Visits from Start of Care: 3  Current functional level related to goals / functional  outcomes: See above     Remaining deficits: See above    Education / Equipment: See above    Patient agrees to discharge. Patient goals were met. Patient is being discharged due to meeting the stated rehab goals.    Susannah Daring, PT, DPT 11/08/24 2:52 PM    "

## 2024-11-08 NOTE — Telephone Encounter (Signed)
 Reviewed and agree with Dr Rennis plan.

## 2024-11-09 ENCOUNTER — Encounter: Payer: Self-pay | Admitting: Orthopedic Surgery

## 2024-11-09 ENCOUNTER — Ambulatory Visit: Admitting: Orthopedic Surgery

## 2024-11-09 DIAGNOSIS — S88112A Complete traumatic amputation at level between knee and ankle, left lower leg, initial encounter: Secondary | ICD-10-CM

## 2024-11-09 DIAGNOSIS — Z89511 Acquired absence of right leg below knee: Secondary | ICD-10-CM | POA: Diagnosis not present

## 2024-11-09 DIAGNOSIS — Z89512 Acquired absence of left leg below knee: Secondary | ICD-10-CM

## 2024-11-09 NOTE — Progress Notes (Signed)
 "  Office Visit Note   Patient: Sean Carter           Date of Birth: [birth year]           MRN: [id number] Visit Date: 11/09/2024              Requested by: Howell Lunger, DO [address] [place],  [place] [address] PCP: Howell Lunger, DO  Chief Complaint  Patient presents with   Right Leg - Follow-up    Hx bilateral BKA with vascular    Left Leg - Follow-up      HPI: Discussed the use of AI scribe software for clinical note transcription with the patient, who gave verbal consent to proceed.  History of Present Illness Sean Carter is a 54 year old male with right transtibial and left transmetatarsal amputations who presents for follow-up regarding prosthetic management and socket fit.  He has a right transtibial amputation and currently uses a prosthesis on this side. The residual limb previously exhibited significant edema, which has since decreased in volume with consistent use of a stump shrinker and prosthetic socks. He reports improved prosthetic fit and comfort compared to prior experiences without these aids.  He recently underwent a left transmetatarsal amputation performed by vascular surgery. He attributes his healing to the use of a stump shrinker. He is now ready to proceed with prosthetic fitting for the left side and is seeking a new prosthesis. He continues rehabilitation with his therapist.  He inquires about the process of adapting to bilateral prosthetics and expresses interest in regaining mobility. He is encouraged by his progress and the support from his care team.     Assessment & Plan: Visit Diagnoses:  1. Below-knee amputation of both lower extremities, initial encounter [facility])     Plan: Assessment and Plan Assessment & Plan Right transtibial amputation with stump complications Chronic stump complications with residual limb volume changes and pending ulcerations. Stump shrinker and liner management reduced limb volume. Revision socket liner  materials needed for optimal fit. - Prescribed revision socket liner materials and supplies for the right leg. - Advised to contact office if complications arise.  Left transmetatarsal amputation with prosthetic management Postoperative consolidation of left transmetatarsal amputation. Stump shrinker use aided consolidation. Ready for prosthetic management and rehabilitation. - Prescribed new prosthesis for the left leg. - Instructed to take prescription to [facility] for prosthetic fitting. - Advised continuation of therapy with Robin. - Recommended follow-up as needed.      Follow-Up Instructions: Return if symptoms worsen or fail to improve.   Ortho Exam  Patient is alert, oriented, no adenopathy, well-dressed, normal affect, normal respiratory effort. Physical Exam EXTREMITIES: Right transtibial amputation with socket instability. Pending ulcerations on the right. Left below knee amputation consolidated well.  Patient is 2 months out from his left BKA surgery.    Patient is an existing right transtibial  amputee.  Patient's current comorbidities are not expected to impact the ability to function with the prescribed prosthesis. Patient verbally communicates a strong desire to use a prosthesis. Patient currently requires mobility aids to ambulate without a prosthesis.  Expects not to use mobility aids with a new prosthesis. Patient is expected to resume or reach their K Level within 6 months. Patient was active before the amputation and independent with stairs, uneven terrain, varying cadence, and a community ambulator.  Patient is a K2 level ambulator that will use a prosthesis to walk around their home and the community over low level  environmental barriers.   Patient is a new left transtibial  amputee.  Patient's current comorbidities are not expected to impact the ability to function with the prescribed prosthesis. Patient verbally communicates a strong desire to use a  prosthesis. Patient currently requires mobility aids to ambulate without a prosthesis.  Expects not to use mobility aids with a new prosthesis. Patient is expected to resume or reach their K Level within 6 months. Patient was active before the amputation and independent with stairs, uneven terrain, varying cadence, and a community ambulator.  Patient is a K2 level ambulator that will use a prosthesis to walk around their home and the community over low level environmental barriers.         Imaging: No results found. No images are attached to the encounter.  Labs: Lab Results  Component Value Date   HGBA1C 6.5 (H) 08/22/2024   HGBA1C 7.6 (A) 05/03/2024   HGBA1C 7.9 (H) 09/02/2023   ESRSEDRATE 122 (H) 08/23/2024   ESRSEDRATE 29 (H) 07/10/2020   CRP 31.3 (H) 08/23/2024   CRP 4.1 07/10/2020   REPTSTATUS 08/28/2024 FINAL 08/23/2024   REPTSTATUS 08/28/2024 FINAL 08/23/2024   GRAMSTAIN  01/19/2023    NO WBC SEEN MODERATE GRAM NEGATIVE RODS FEW GRAM POSITIVE COCCI IN PAIRS    CULT  08/23/2024    NO GROWTH 5 DAYS Performed at [facility], [address] [place], [place] [address]    CULT  08/23/2024    NO GROWTH 5 DAYS Performed at [facility], [address], [place], [place] [address]    LABORGA GROUP B STREP(S.AGALACTIAE)ISOLATED 08/21/2024     Lab Results  Component Value Date   ALBUMIN 2.3 (L) 09/04/2024   ALBUMIN 1.9 (L) 08/28/2024   ALBUMIN 1.9 (L) 08/27/2024    Lab Results  Component Value Date   MG 2.2 08/23/2024   MG 2.3 08/22/2024   MG 1.8 07/24/2022   No results found for: VD25OH  No results found for: PREALBUMIN    Latest Ref Rng & Units 09/04/2024    5:21 AM 08/31/2024    5:14 AM 08/28/2024    5:15 AM  CBC EXTENDED  WBC 4.0 - 10.5 K/uL 6.6  9.0  10.8   RBC 4.22 - 5.81 MIL/uL 3.87  3.76  3.49   Hemoglobin 13.0 - 17.0 g/dL 89.5  89.7  9.3   HCT 60.9 - 52.0 % 32.3  31.4  29.0   Platelets 150 - 400 K/uL 488  528  451       There is no height or weight on file to calculate BMI.  Orders:  No orders of the defined types were placed in this encounter.  No orders of the defined types were placed in this encounter.    Procedures: No procedures performed  Clinical Data: No additional findings.  ROS:  All other systems negative, except as noted in the HPI. Review of Systems  Objective: Vital Signs: There were no vitals taken for this visit.  Specialty Comments:  No specialty comments available.  PMFS History: Patient Active Problem List   Diagnosis Date Noted   Prerenal azotemia 09/04/2024   Coping style affecting medical condition 08/29/2024   Left foot infection 08/24/2024   Below-knee amputation of left lower extremity ([name]) 08/24/2024   Sepsis due to Streptococcus agalactiae ([name]) 08/22/2024   Foot osteomyelitis, left s/p BKA ([name]) 08/21/2024   Chronic health problem 08/21/2024   Anemia 08/21/2024   Osteomyelitis of left foot ([name]) 08/21/2024  S/P BKA (below knee amputation) unilateral, right ([name]) 09/07/2023   Wound infection 09/03/2023   Type 2 diabetes mellitus ([name]) 09/02/2023   Elevated LFTs 09/02/2023   Normocytic anemia 09/02/2023   Subacute cough 09/02/2023   Sepsis with acute organ dysfunction without septic shock ([name]) 09/01/2023   Restless legs syndrome 06/25/2023   Hyperkalemia 02/18/2023   S/P transmetatarsal amputation of foot, right ([name]) 01/31/2023   Transaminitis 01/24/2023   Diabetic infection of right foot ([name]) 01/16/2023   Open wound of plantar aspect of foot 01/16/2023   Restrictive lung disease 01/15/2023   History of wheezing 08/28/2022   Symptoms of gastroesophageal reflux 08/28/2022   Leukocytosis 07/20/2022   Diabetic retinopathy ([name]) 06/29/2022   Mixed hyperlipidemia 11/20/2021   Blurry vision 07/10/2020   Superficial vein thrombosis 06/06/2020   Hypertension associated with diabetes ([name]) 06/06/2020   Diabetes mellitus type 2 with peripheral  artery disease ([name]) 06/06/2020   Amputation of left great toe    Sepsis with streptococcus agalactiae ([name])    Hyponatremia 05/30/2020   Hypokalemia 05/30/2020   Past Medical History:  Diagnosis Date   Blurry vision 07/10/2020   Diabetes mellitus without complication ([name])    Gangrene of toe of left foot ([name])    Peripheral arterial disease    Sepsis due to Streptococcus, group B ([name]) 06/20/2020   Streptococcal bacteremia     Family History  Problem Relation Age of Onset   Diabetes Father     Past Surgical History:  Procedure Laterality Date   ABDOMINAL AORTOGRAM W/LOWER EXTREMITY N/A 01/18/2023   Procedure: ABDOMINAL AORTOGRAM W/LOWER EXTREMITY;  Surgeon: Eliza Lonni RAMAN, MD;  Location: [facility] INVASIVE CV LAB;  Service: Cardiovascular;  Laterality: N/A;   AMPUTATION Left 05/31/2020   Procedure: Ray Amputation of Left Great Toe with Negative Pressure Vac Placement;  Surgeon: Gretta Lonni PARAS, MD;  Location: [facility] OR;  Service: Vascular;  Laterality: Left;   AMPUTATION Right 07/22/2022   Procedure: RIGHT FIRST AND SECOND RAY PARTIAL  AMPUTATION;  Surgeon: Magda Debby SAILOR, MD;  Location: [facility] OR;  Service: Vascular;  Laterality: Right;   AMPUTATION Right 09/02/2023   Procedure: AMPUTATION BELOW KNEE;  Surgeon: Serene Gaile ORN, MD;  Location: [facility] OR;  Service: Vascular;  Laterality: Right;   AMPUTATION Left 08/22/2024   Procedure: LEFT AMPUTATION BELOW KNEE;  Surgeon: Sheree Penne Lonni, MD;  Location: [facility] OR;  Service: Vascular;  Laterality: Left;   APPLICATION OF WOUND VAC Right 01/22/2023   Procedure: APPLICATION OF WOUND VAC, RIGHT FOOT;  Surgeon: Sheree Penne Lonni, MD;  Location: [facility] OR;  Service: Vascular;  Laterality: Right;   INCISION AND DRAINAGE OF WOUND Right 01/22/2023   Procedure: WASHOUT OF RIGHT FOOT WOUND;  Surgeon: Sheree Penne Lonni, MD;  Location: [facility] OR;  Service: Vascular;  Laterality: Right;   PERIPHERAL VASCULAR BALLOON ANGIOPLASTY  01/18/2023    Procedure: PERIPHERAL VASCULAR BALLOON ANGIOPLASTY;  Surgeon: Eliza Lonni RAMAN, MD;  Location: [facility] INVASIVE CV LAB;  Service: Cardiovascular;;   STUMP REVISION Right 01/22/2023   Procedure: PARTIAL CLOSURE OF RIGHT FOOT WOUND;  Surgeon: Sheree Penne Lonni, MD;  Location: [facility] OR;  Service: Vascular;  Laterality: Right;   TRANSMETATARSAL AMPUTATION Right 01/19/2023   Procedure: TRANSMETATARSAL AMPUTATION;  Surgeon: Sheree Penne Lonni, MD;  Location: [facility] OR;  Service: Vascular;  Laterality: Right;   Social History   Occupational History   Not on file  Tobacco Use   Smoking status: Never    Passive exposure: Never   Smokeless  tobacco: Never  Vaping Use   Vaping status: Never Used  Substance and Sexual Activity   Alcohol  use: Not on file   Drug use: Never   Sexual activity: Not on file         "

## 2024-11-10 ENCOUNTER — Other Ambulatory Visit: Payer: Self-pay | Admitting: Family Medicine

## 2024-12-11 ENCOUNTER — Encounter (INDEPENDENT_AMBULATORY_CARE_PROVIDER_SITE_OTHER): Admitting: Ophthalmology

## 2024-12-25 ENCOUNTER — Encounter: Payer: Self-pay | Admitting: Physical Medicine and Rehabilitation
